# Patient Record
Sex: Male | Born: 1960
Health system: Southern US, Community
[De-identification: ages and names within clinical notes are randomized; demographics above are authoritative.]

## PROBLEM LIST (undated history)

## (undated) DIAGNOSIS — I469 Cardiac arrest, cause unspecified: Secondary | ICD-10-CM

## (undated) DIAGNOSIS — R55 Syncope and collapse: Secondary | ICD-10-CM

## (undated) DIAGNOSIS — I509 Heart failure, unspecified: Secondary | ICD-10-CM

## (undated) DIAGNOSIS — I255 Ischemic cardiomyopathy: Secondary | ICD-10-CM

## (undated) DIAGNOSIS — I2510 Atherosclerotic heart disease of native coronary artery without angina pectoris: Secondary | ICD-10-CM

## (undated) DIAGNOSIS — F1011 Alcohol abuse, in remission: Secondary | ICD-10-CM

## (undated) DIAGNOSIS — K219 Gastro-esophageal reflux disease without esophagitis: Secondary | ICD-10-CM

## (undated) DIAGNOSIS — E7800 Pure hypercholesterolemia, unspecified: Secondary | ICD-10-CM

## (undated) DIAGNOSIS — I10 Essential (primary) hypertension: Secondary | ICD-10-CM

## (undated) DIAGNOSIS — R569 Unspecified convulsions: Principal | ICD-10-CM

## (undated) DIAGNOSIS — M1990 Unspecified osteoarthritis, unspecified site: Secondary | ICD-10-CM

## (undated) DIAGNOSIS — Z95810 Presence of automatic (implantable) cardiac defibrillator: Secondary | ICD-10-CM

## (undated) HISTORY — PX: CARDIAC CATHETERIZATION: SHX172

## (undated) HISTORY — PX: TONSILLECTOMY AND ADENOIDECTOMY: SUR1326

---
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
## 2015-08-18 DIAGNOSIS — E869 Volume depletion, unspecified: Secondary | ICD-10-CM | POA: Insufficient documentation

## 2015-08-18 DIAGNOSIS — G459 Transient cerebral ischemic attack, unspecified: Secondary | ICD-10-CM | POA: Insufficient documentation

## 2015-08-18 DIAGNOSIS — D696 Thrombocytopenia, unspecified: Secondary | ICD-10-CM | POA: Insufficient documentation

## 2015-08-19 DIAGNOSIS — S134XXA Sprain of ligaments of cervical spine, initial encounter: Secondary | ICD-10-CM | POA: Insufficient documentation

## 2015-08-19 DIAGNOSIS — S42112A Displaced fracture of body of scapula, left shoulder, initial encounter for closed fracture: Secondary | ICD-10-CM | POA: Insufficient documentation

## 2015-08-31 DIAGNOSIS — Z5971 Insufficient health insurance coverage: Secondary | ICD-10-CM | POA: Insufficient documentation

## 2017-11-29 ENCOUNTER — Inpatient Hospital Stay (HOSPITAL_COMMUNITY): Payer: Medicaid Other

## 2017-11-29 ENCOUNTER — Inpatient Hospital Stay (HOSPITAL_COMMUNITY)
Admission: EM | Admit: 2017-11-29 | Discharge: 2017-12-13 | DRG: 233 | Disposition: A | Discharge status: Home Health | Payer: Medicaid Other | Attending: Cardiothoracic Surgery | Admitting: Cardiothoracic Surgery

## 2017-11-29 ENCOUNTER — Emergency Department (HOSPITAL_COMMUNITY): Payer: Medicaid Other

## 2017-11-29 ENCOUNTER — Other Ambulatory Visit: Payer: Self-pay

## 2017-11-29 ENCOUNTER — Encounter (HOSPITAL_COMMUNITY)
Admission: EM | Disposition: A | Discharge status: Home Health | Payer: Self-pay | Source: Home / Self Care | Attending: Internal Medicine

## 2017-11-29 DIAGNOSIS — D62 Acute posthemorrhagic anemia: Secondary | ICD-10-CM | POA: Diagnosis not present

## 2017-11-29 DIAGNOSIS — I469 Cardiac arrest, cause unspecified: Secondary | ICD-10-CM | POA: Insufficient documentation

## 2017-11-29 DIAGNOSIS — R008 Other abnormalities of heart beat: Secondary | ICD-10-CM | POA: Diagnosis not present

## 2017-11-29 DIAGNOSIS — I213 ST elevation (STEMI) myocardial infarction of unspecified site: Principal | ICD-10-CM | POA: Diagnosis present

## 2017-11-29 DIAGNOSIS — Z9289 Personal history of other medical treatment: Secondary | ICD-10-CM

## 2017-11-29 DIAGNOSIS — Z4509 Encounter for adjustment and management of other cardiac device: Secondary | ICD-10-CM

## 2017-11-29 DIAGNOSIS — E876 Hypokalemia: Secondary | ICD-10-CM | POA: Diagnosis present

## 2017-11-29 DIAGNOSIS — R739 Hyperglycemia, unspecified: Secondary | ICD-10-CM | POA: Diagnosis present

## 2017-11-29 DIAGNOSIS — I4901 Ventricular fibrillation: Secondary | ICD-10-CM | POA: Diagnosis not present

## 2017-11-29 DIAGNOSIS — J90 Pleural effusion, not elsewhere classified: Secondary | ICD-10-CM

## 2017-11-29 DIAGNOSIS — I255 Ischemic cardiomyopathy: Secondary | ICD-10-CM | POA: Diagnosis present

## 2017-11-29 DIAGNOSIS — I2584 Coronary atherosclerosis due to calcified coronary lesion: Secondary | ICD-10-CM | POA: Diagnosis present

## 2017-11-29 DIAGNOSIS — J9622 Acute and chronic respiratory failure with hypercapnia: Secondary | ICD-10-CM

## 2017-11-29 DIAGNOSIS — I493 Ventricular premature depolarization: Secondary | ICD-10-CM | POA: Diagnosis present

## 2017-11-29 DIAGNOSIS — G931 Anoxic brain damage, not elsewhere classified: Secondary | ICD-10-CM

## 2017-11-29 DIAGNOSIS — E669 Obesity, unspecified: Secondary | ICD-10-CM | POA: Diagnosis present

## 2017-11-29 DIAGNOSIS — J690 Pneumonitis due to inhalation of food and vomit: Secondary | ICD-10-CM | POA: Diagnosis not present

## 2017-11-29 DIAGNOSIS — I472 Ventricular tachycardia: Secondary | ICD-10-CM | POA: Diagnosis not present

## 2017-11-29 DIAGNOSIS — E8770 Fluid overload, unspecified: Secondary | ICD-10-CM | POA: Diagnosis not present

## 2017-11-29 DIAGNOSIS — R570 Cardiogenic shock: Secondary | ICD-10-CM

## 2017-11-29 DIAGNOSIS — N179 Acute kidney failure, unspecified: Secondary | ICD-10-CM | POA: Diagnosis present

## 2017-11-29 DIAGNOSIS — J9819 Other pulmonary collapse: Secondary | ICD-10-CM | POA: Diagnosis not present

## 2017-11-29 DIAGNOSIS — G92 Toxic encephalopathy: Secondary | ICD-10-CM | POA: Diagnosis not present

## 2017-11-29 DIAGNOSIS — J9601 Acute respiratory failure with hypoxia: Secondary | ICD-10-CM | POA: Diagnosis present

## 2017-11-29 DIAGNOSIS — R5082 Postprocedural fever: Secondary | ICD-10-CM | POA: Diagnosis not present

## 2017-11-29 DIAGNOSIS — Z9189 Other specified personal risk factors, not elsewhere classified: Secondary | ICD-10-CM

## 2017-11-29 DIAGNOSIS — Z951 Presence of aortocoronary bypass graft: Secondary | ICD-10-CM

## 2017-11-29 DIAGNOSIS — I110 Hypertensive heart disease with heart failure: Secondary | ICD-10-CM | POA: Diagnosis present

## 2017-11-29 DIAGNOSIS — I509 Heart failure, unspecified: Secondary | ICD-10-CM

## 2017-11-29 DIAGNOSIS — I5021 Acute systolic (congestive) heart failure: Secondary | ICD-10-CM | POA: Diagnosis not present

## 2017-11-29 DIAGNOSIS — I2510 Atherosclerotic heart disease of native coronary artery without angina pectoris: Secondary | ICD-10-CM | POA: Diagnosis present

## 2017-11-29 DIAGNOSIS — E878 Other disorders of electrolyte and fluid balance, not elsewhere classified: Secondary | ICD-10-CM | POA: Diagnosis present

## 2017-11-29 DIAGNOSIS — F1010 Alcohol abuse, uncomplicated: Secondary | ICD-10-CM | POA: Diagnosis present

## 2017-11-29 DIAGNOSIS — D6489 Other specified anemias: Secondary | ICD-10-CM | POA: Diagnosis present

## 2017-11-29 DIAGNOSIS — J9811 Atelectasis: Secondary | ICD-10-CM | POA: Diagnosis present

## 2017-11-29 DIAGNOSIS — E872 Acidosis: Secondary | ICD-10-CM | POA: Diagnosis present

## 2017-11-29 DIAGNOSIS — I252 Old myocardial infarction: Secondary | ICD-10-CM

## 2017-11-29 DIAGNOSIS — Z6823 Body mass index (BMI) 23.0-23.9, adult: Secondary | ICD-10-CM

## 2017-11-29 DIAGNOSIS — R0602 Shortness of breath: Secondary | ICD-10-CM

## 2017-11-29 DIAGNOSIS — J9690 Respiratory failure, unspecified, unspecified whether with hypoxia or hypercapnia: Secondary | ICD-10-CM

## 2017-11-29 DIAGNOSIS — Z9889 Other specified postprocedural states: Secondary | ICD-10-CM

## 2017-11-29 HISTORY — DX: Cardiac arrest, cause unspecified: I46.9

## 2017-11-29 HISTORY — PX: IABP INSERTION: CATH118242

## 2017-11-29 HISTORY — PX: RIGHT HEART CATH: CATH118263

## 2017-11-29 HISTORY — PX: LEFT HEART CATH AND CORONARY ANGIOGRAPHY: CATH118249

## 2017-11-29 LAB — POCT I-STAT 3, ART BLOOD GAS (G3+)
ACID-BASE DEFICIT: 5 mmol/L — AB (ref 0.0–2.0)
Bicarbonate: 19.3 mmol/L — ABNORMAL LOW (ref 20.0–28.0)
O2 Saturation: 98 %
PH ART: 7.352 (ref 7.350–7.450)
TCO2: 20 mmol/L — ABNORMAL LOW (ref 22–32)
pCO2 arterial: 34.9 mmHg (ref 32.0–48.0)
pO2, Arterial: 117 mmHg — ABNORMAL HIGH (ref 83.0–108.0)

## 2017-11-29 LAB — CBC WITH DIFFERENTIAL/PLATELET
Basophils Absolute: 0.1 10*3/uL (ref 0.0–0.1)
Basophils Relative: 1 %
EOS ABS: 0.2 10*3/uL (ref 0.0–0.7)
EOS PCT: 2 %
HCT: 42.5 % (ref 39.0–52.0)
Hemoglobin: 14.3 g/dL (ref 13.0–17.0)
LYMPHS ABS: 3.1 10*3/uL (ref 0.7–4.0)
Lymphocytes Relative: 29 %
MCH: 32.6 pg (ref 26.0–34.0)
MCHC: 33.6 g/dL (ref 30.0–36.0)
MCV: 97 fL (ref 78.0–100.0)
MONO ABS: 0.9 10*3/uL (ref 0.1–1.0)
Monocytes Relative: 8 %
Neutro Abs: 6.5 10*3/uL (ref 1.7–7.7)
Neutrophils Relative %: 60 %
PLATELETS: 241 10*3/uL (ref 150–400)
RBC: 4.38 MIL/uL (ref 4.22–5.81)
RDW: 13 % (ref 11.5–15.5)
WBC: 10.8 10*3/uL — AB (ref 4.0–10.5)

## 2017-11-29 LAB — BASIC METABOLIC PANEL
ANION GAP: 14 (ref 5–15)
BUN: 14 mg/dL (ref 6–20)
CALCIUM: 8 mg/dL — AB (ref 8.9–10.3)
CO2: 20 mmol/L — ABNORMAL LOW (ref 22–32)
Chloride: 105 mmol/L (ref 101–111)
Creatinine, Ser: 1.49 mg/dL — ABNORMAL HIGH (ref 0.61–1.24)
GFR, EST AFRICAN AMERICAN: 60 mL/min — AB (ref >60)
GFR, EST NON AFRICAN AMERICAN: 52 mL/min — AB (ref >60)
Glucose, Bld: 156 mg/dL — ABNORMAL HIGH (ref 65–99)
POTASSIUM: 3.3 mmol/L — AB (ref 3.5–5.1)
Sodium: 139 mmol/L (ref 135–145)

## 2017-11-29 LAB — GLUCOSE, CAPILLARY
Glucose-Capillary: 131 mg/dL — ABNORMAL HIGH (ref 65–99)
Glucose-Capillary: 112 mg/dL — ABNORMAL HIGH (ref 65–99)

## 2017-11-29 LAB — COOXEMETRY PANEL
CARBOXYHEMOGLOBIN: 1 % (ref 0.5–1.5)
Methemoglobin: 1.2 % (ref 0.0–1.5)
O2 SAT: 59.2 %
TOTAL HEMOGLOBIN: 15.1 g/dL (ref 12.0–16.0)

## 2017-11-29 LAB — COMPREHENSIVE METABOLIC PANEL
ALT: 37 U/L (ref 17–63)
AST: 78 U/L — AB (ref 15–41)
Albumin: 4.1 g/dL (ref 3.5–5.0)
Alkaline Phosphatase: 45 U/L (ref 38–126)
Anion gap: 15 (ref 5–15)
BILIRUBIN TOTAL: 1 mg/dL (ref 0.3–1.2)
BUN: 14 mg/dL (ref 6–20)
CHLORIDE: 106 mmol/L (ref 101–111)
CO2: 18 mmol/L — ABNORMAL LOW (ref 22–32)
CREATININE: 1.69 mg/dL — AB (ref 0.61–1.24)
Calcium: 9 mg/dL (ref 8.9–10.3)
GFR calc Af Amer: 51 mL/min — ABNORMAL LOW (ref >60)
GFR, EST NON AFRICAN AMERICAN: 44 mL/min — AB (ref >60)
Glucose, Bld: 181 mg/dL — ABNORMAL HIGH (ref 65–99)
Potassium: 3.4 mmol/L — ABNORMAL LOW (ref 3.5–5.1)
Sodium: 139 mmol/L (ref 135–145)
Total Protein: 6.6 g/dL (ref 6.5–8.1)

## 2017-11-29 LAB — I-STAT TROPONIN, ED: Troponin i, poc: 0.95 ng/mL (ref 0.00–0.08)

## 2017-11-29 LAB — I-STAT CHEM 8, ED
BUN: 15 mg/dL (ref 6–20)
CREATININE: 1.4 mg/dL — AB (ref 0.61–1.24)
Calcium, Ion: 1.12 mmol/L — ABNORMAL LOW (ref 1.15–1.40)
Chloride: 106 mmol/L (ref 101–111)
Glucose, Bld: 181 mg/dL — ABNORMAL HIGH (ref 65–99)
HEMATOCRIT: 42 % (ref 39.0–52.0)
HEMOGLOBIN: 14.3 g/dL (ref 13.0–17.0)
POTASSIUM: 3.4 mmol/L — AB (ref 3.5–5.1)
Sodium: 142 mmol/L (ref 135–145)
TCO2: 19 mmol/L — ABNORMAL LOW (ref 22–32)

## 2017-11-29 LAB — POCT I-STAT 3, VENOUS BLOOD GAS (G3P V)
ACID-BASE DEFICIT: 2 mmol/L (ref 0.0–2.0)
Bicarbonate: 23.6 mmol/L (ref 20.0–28.0)
O2 SAT: 51 %
TCO2: 25 mmol/L (ref 22–32)
pCO2, Ven: 44.2 mmHg (ref 44.0–60.0)
pH, Ven: 7.335 (ref 7.250–7.430)
pO2, Ven: 29 mmHg — CL (ref 32.0–45.0)

## 2017-11-29 LAB — POCT ACTIVATED CLOTTING TIME: Activated Clotting Time: 213 seconds

## 2017-11-29 LAB — PROTIME-INR
INR: 1.11
PROTHROMBIN TIME: 14.2 s (ref 11.4–15.2)

## 2017-11-29 LAB — SURGICAL PCR SCREEN
MRSA, PCR: NEGATIVE
STAPHYLOCOCCUS AUREUS: NEGATIVE

## 2017-11-29 LAB — TRIGLYCERIDES: TRIGLYCERIDES: 101 mg/dL (ref <150)

## 2017-11-29 SURGERY — LEFT HEART CATH AND CORONARY ANGIOGRAPHY
Anesthesia: LOCAL

## 2017-11-29 MED ORDER — FENTANYL CITRATE (PF) 100 MCG/2ML IJ SOLN
INTRAMUSCULAR | Status: DC | PRN
  Administered 2017-11-29: 50 ug via INTRAVENOUS

## 2017-11-29 MED ORDER — SODIUM CHLORIDE 0.9% FLUSH
3.0000 mL | INTRAVENOUS | Status: DC | PRN
  Administered 2017-12-02: 3 mL via INTRAVENOUS
  Filled 2017-11-29: qty 3

## 2017-11-29 MED ORDER — FUROSEMIDE 10 MG/ML IJ SOLN
INTRAMUSCULAR | Status: AC
  Filled 2017-11-29: qty 4

## 2017-11-29 MED ORDER — LIDOCAINE HCL (PF) 1 % IJ SOLN
INTRAMUSCULAR | Status: AC
  Filled 2017-11-29: qty 30

## 2017-11-29 MED ORDER — MIDAZOLAM HCL 2 MG/2ML IJ SOLN
INTRAMUSCULAR | Status: AC
  Filled 2017-11-29: qty 2

## 2017-11-29 MED ORDER — PANTOPRAZOLE SODIUM 40 MG IV SOLR
40.0000 mg | INTRAVENOUS | Status: DC
  Administered 2017-11-29: 40 mg via INTRAVENOUS
  Filled 2017-11-29: qty 40

## 2017-11-29 MED ORDER — MAGNESIUM SULFATE 2 GM/50ML IV SOLN
2.0000 g | Freq: Once | INTRAVENOUS | Status: AC
  Administered 2017-11-29: 2 g via INTRAVENOUS
  Filled 2017-11-29: qty 50

## 2017-11-29 MED ORDER — MIDAZOLAM HCL 2 MG/2ML IJ SOLN
INTRAMUSCULAR | Status: DC | PRN
  Administered 2017-11-29: 2 mg

## 2017-11-29 MED ORDER — IOPAMIDOL (ISOVUE-370) INJECTION 76%
INTRAVENOUS | Status: DC | PRN
Start: 2017-11-29 — End: 2017-11-29
  Administered 2017-11-29: 75 mL via INTRAVENOUS

## 2017-11-29 MED ORDER — FENTANYL CITRATE (PF) 100 MCG/2ML IJ SOLN
INTRAMUSCULAR | Status: AC
  Filled 2017-11-29: qty 2

## 2017-11-29 MED ORDER — SODIUM CHLORIDE 0.9 % IV SOLN: 250.0000 mL | INTRAVENOUS | Status: DC | PRN

## 2017-11-29 MED ORDER — FUROSEMIDE 10 MG/ML IJ SOLN
INTRAMUSCULAR | Status: DC | PRN
  Administered 2017-11-29: 80 mg via INTRAVENOUS

## 2017-11-29 MED ORDER — ACETAMINOPHEN 325 MG PO TABS: 650.0000 mg | ORAL_TABLET | ORAL | Status: DC | PRN

## 2017-11-29 MED ORDER — PROPOFOL 1000 MG/100ML IV EMUL
INTRAVENOUS | Status: AC
  Filled 2017-11-29: qty 100

## 2017-11-29 MED ORDER — SODIUM CHLORIDE 0.9% FLUSH
3.0000 mL | Freq: Two times a day (BID) | INTRAVENOUS | Status: DC
  Administered 2017-11-29 – 2017-12-02 (×5): 3 mL via INTRAVENOUS

## 2017-11-29 MED ORDER — SODIUM CHLORIDE 0.9 % IV SOLN
3.0000 g | Freq: Four times a day (QID) | INTRAVENOUS | Status: DC
  Filled 2017-11-29 (×3): qty 3

## 2017-11-29 MED ORDER — FENTANYL 2500MCG IN NS 250ML (10MCG/ML) PREMIX INFUSION
25.0000 ug/h | INTRAVENOUS | Status: DC
  Administered 2017-11-29: 50 ug/h via INTRAVENOUS
  Administered 2017-11-30: 100 ug/h via INTRAVENOUS
  Administered 2017-12-01: 300 ug/h via INTRAVENOUS
  Administered 2017-12-02: 200 ug/h via INTRAVENOUS
  Filled 2017-11-29 (×6): qty 250

## 2017-11-29 MED ORDER — HEPARIN (PORCINE) IN NACL 100-0.45 UNIT/ML-% IJ SOLN
1000.0000 [IU]/h | INTRAMUSCULAR | Status: DC
  Administered 2017-11-29: 1000 [IU]/h via INTRAVENOUS
  Filled 2017-11-29: qty 250

## 2017-11-29 MED ORDER — FENTANYL CITRATE (PF) 100 MCG/2ML IJ SOLN: 50.0000 ug | Freq: Once | INTRAMUSCULAR | Status: DC

## 2017-11-29 MED ORDER — SODIUM CHLORIDE 0.9 % IV SOLN
0.0000 mg/h | INTRAVENOUS | Status: DC
  Administered 2017-11-29: 1 mg/h via INTRAVENOUS
  Administered 2017-11-30: 2 mg/h via INTRAVENOUS
  Administered 2017-12-01: 3 mg/h via INTRAVENOUS
  Filled 2017-11-29 (×4): qty 10

## 2017-11-29 MED ORDER — VANCOMYCIN HCL IN DEXTROSE 1-5 GM/200ML-% IV SOLN
1000.0000 mg | Freq: Two times a day (BID) | INTRAVENOUS | Status: DC
  Administered 2017-11-29: 1000 mg via INTRAVENOUS
  Filled 2017-11-29 (×2): qty 200

## 2017-11-29 MED ORDER — MILRINONE LACTATE IN DEXTROSE 20-5 MG/100ML-% IV SOLN
0.3750 ug/kg/min | INTRAVENOUS | Status: DC
  Administered 2017-11-30: 0.375 ug/kg/min via INTRAVENOUS
  Filled 2017-11-29 (×2): qty 100

## 2017-11-29 MED ORDER — LIDOCAINE HCL (PF) 1 % IJ SOLN
INTRAMUSCULAR | Status: DC | PRN
  Administered 2017-11-29: 2 mL via INTRADERMAL
  Administered 2017-11-29: 15 mL via SUBCUTANEOUS
  Administered 2017-11-29: 1 mL via SUBCUTANEOUS

## 2017-11-29 MED ORDER — HEPARIN (PORCINE) IN NACL 2-0.9 UNIT/ML-% IJ SOLN
INTRAMUSCULAR | Status: AC | PRN
  Administered 2017-11-29: 1500 mL via INTRA_ARTERIAL

## 2017-11-29 MED ORDER — SPIRONOLACTONE 25 MG PO TABS
12.5000 mg | ORAL_TABLET | Freq: Every day | ORAL | Status: DC
  Administered 2017-11-30 – 2017-12-04 (×5): 12.5 mg via ORAL
  Filled 2017-11-29: qty 0.5
  Filled 2017-11-29 (×5): qty 1
  Filled 2017-11-29: qty 0.5
  Filled 2017-11-29 (×2): qty 1
  Filled 2017-11-29: qty 0.5
  Filled 2017-11-29: qty 1

## 2017-11-29 MED ORDER — HEPARIN (PORCINE) IN NACL 2-0.9 UNIT/ML-% IJ SOLN
INTRAMUSCULAR | Status: AC
  Filled 2017-11-29: qty 1500

## 2017-11-29 MED ORDER — SODIUM CHLORIDE 0.9 % IV SOLN
INTRAVENOUS | Status: DC | PRN
  Administered 2017-11-29: 10 mL/h via INTRAVENOUS

## 2017-11-29 MED ORDER — MILRINONE LACTATE IN DEXTROSE 20-5 MG/100ML-% IV SOLN
INTRAVENOUS | Status: DC | PRN
  Administered 2017-11-29: 0.25 ug/kg/min via INTRAVENOUS

## 2017-11-29 MED ORDER — ONDANSETRON HCL 4 MG/2ML IJ SOLN
4.0000 mg | Freq: Four times a day (QID) | INTRAMUSCULAR | Status: DC | PRN
  Administered 2017-12-03 – 2017-12-06 (×3): 4 mg via INTRAVENOUS
  Filled 2017-11-29 (×3): qty 2

## 2017-11-29 MED ORDER — POTASSIUM CHLORIDE 10 MEQ/50ML IV SOLN
10.0000 meq | INTRAVENOUS | Status: AC
  Administered 2017-11-29 – 2017-11-30 (×2): 10 meq via INTRAVENOUS
  Filled 2017-11-29 (×2): qty 50

## 2017-11-29 MED ORDER — PROPOFOL 1000 MG/100ML IV EMUL
0.0000 ug/kg/min | INTRAVENOUS | Status: DC
  Administered 2017-11-30: 10 ug/kg/min via INTRAVENOUS
  Filled 2017-11-29: qty 100

## 2017-11-29 MED ORDER — ETOMIDATE 2 MG/ML IV SOLN
INTRAVENOUS | Status: DC | PRN
  Administered 2017-11-29: 20 mg via INTRAVENOUS

## 2017-11-29 MED ORDER — PIPERACILLIN-TAZOBACTAM 3.375 G IVPB
3.3750 g | Freq: Three times a day (TID) | INTRAVENOUS | Status: AC
  Administered 2017-11-29 – 2017-12-06 (×21): 3.375 g via INTRAVENOUS
  Filled 2017-11-29 (×22): qty 50

## 2017-11-29 MED ORDER — PROPOFOL 1000 MG/100ML IV EMUL
0.0000 ug/kg/min | INTRAVENOUS | Status: DC
  Administered 2017-11-29: 50 ug/kg/min via INTRAVENOUS

## 2017-11-29 MED ORDER — NOREPINEPHRINE BITARTRATE 1 MG/ML IV SOLN
0.0000 ug/min | INTRAVENOUS | Status: DC
  Administered 2017-11-29: 5 ug/min via INTRAVENOUS
  Administered 2017-11-30: 12 ug/min via INTRAVENOUS
  Administered 2017-11-30: 20 ug/min via INTRAVENOUS
  Filled 2017-11-29 (×2): qty 4

## 2017-11-29 MED ORDER — SODIUM CHLORIDE 0.9 % IV SOLN
INTRAVENOUS | Status: DC | PRN
  Administered 2017-11-29 – 2017-12-04 (×2): 1000 mL via INTRAVENOUS

## 2017-11-29 MED ORDER — HEPARIN SODIUM (PORCINE) 1000 UNIT/ML IJ SOLN
INTRAMUSCULAR | Status: DC | PRN
  Administered 2017-11-29: 4000 [IU] via INTRAVENOUS

## 2017-11-29 MED ORDER — SODIUM BICARBONATE 8.4 % IV SOLN
INTRAVENOUS | Status: DC | PRN
  Administered 2017-11-29: 50 meq via INTRAVENOUS

## 2017-11-29 MED ORDER — IOPAMIDOL (ISOVUE-370) INJECTION 76%
INTRAVENOUS | Status: AC
  Filled 2017-11-29: qty 125

## 2017-11-29 MED ORDER — HEPARIN SODIUM (PORCINE) 1000 UNIT/ML IJ SOLN
INTRAMUSCULAR | Status: DC | PRN
  Administered 2017-11-29: 2500 [IU] via INTRAVENOUS

## 2017-11-29 MED ORDER — POTASSIUM CHLORIDE 20 MEQ/15ML (10%) PO SOLN
40.0000 meq | ORAL | Status: AC
  Administered 2017-11-29 (×2): 40 meq
  Filled 2017-11-29 (×4): qty 30

## 2017-11-29 MED ORDER — ORAL CARE MOUTH RINSE
15.0000 mL | Freq: Four times a day (QID) | OROMUCOSAL | Status: DC
  Administered 2017-11-29 – 2017-12-02 (×14): 15 mL via OROMUCOSAL

## 2017-11-29 MED ORDER — MIDAZOLAM HCL 2 MG/2ML IJ SOLN
INTRAMUSCULAR | Status: DC | PRN
  Administered 2017-11-29: 3 mg via INTRAVENOUS

## 2017-11-29 MED ORDER — VERAPAMIL HCL 2.5 MG/ML IV SOLN
INTRAVENOUS | Status: DC | PRN
  Administered 2017-11-29: 10 mL via INTRA_ARTERIAL

## 2017-11-29 MED ORDER — INSULIN ASPART 100 UNIT/ML ~~LOC~~ SOLN
2.0000 [IU] | SUBCUTANEOUS | Status: DC
  Administered 2017-11-29 – 2017-12-07 (×11): 2 [IU] via SUBCUTANEOUS
  Filled 2017-11-29: qty 0.06

## 2017-11-29 MED ORDER — HEPARIN SODIUM (PORCINE) 1000 UNIT/ML IJ SOLN
INTRAMUSCULAR | Status: AC
  Filled 2017-11-29: qty 1

## 2017-11-29 MED ORDER — FENTANYL CITRATE (PF) 100 MCG/2ML IJ SOLN
INTRAMUSCULAR | Status: DC | PRN
  Administered 2017-11-29: 25 ug via INTRAVENOUS
  Administered 2017-11-29: 50 ug via INTRAVENOUS

## 2017-11-29 MED ORDER — ROCURONIUM BROMIDE 50 MG/5ML IV SOLN
INTRAVENOUS | Status: DC | PRN
  Administered 2017-11-29: 50 mg via INTRAVENOUS
  Administered 2017-11-29: 80 mg via INTRAVENOUS
  Administered 2017-11-29: 50 mg via INTRAVENOUS

## 2017-11-29 MED ORDER — PROPOFOL 10 MG/ML IV BOLUS
INTRAVENOUS | Status: DC | PRN
  Administered 2017-11-30: 08:00:00 via INTRAVENOUS

## 2017-11-29 MED ORDER — DIGOXIN 125 MCG PO TABS
0.1250 mg | ORAL_TABLET | Freq: Every day | ORAL | Status: DC
  Administered 2017-11-30 – 2017-12-06 (×7): 0.125 mg via ORAL
  Filled 2017-11-29 (×7): qty 1

## 2017-11-29 MED ORDER — FENTANYL BOLUS VIA INFUSION
50.0000 ug | INTRAVENOUS | Status: DC | PRN
  Administered 2017-11-30 – 2017-12-02 (×6): 50 ug via INTRAVENOUS
  Filled 2017-11-29: qty 50

## 2017-11-29 MED ORDER — CHLORHEXIDINE GLUCONATE 0.12% ORAL RINSE (MEDLINE KIT)
15.0000 mL | Freq: Two times a day (BID) | OROMUCOSAL | Status: DC
  Administered 2017-11-29 – 2017-12-04 (×10): 15 mL via OROMUCOSAL

## 2017-11-29 MED ORDER — VERAPAMIL HCL 2.5 MG/ML IV SOLN
INTRAVENOUS | Status: AC
  Filled 2017-11-29: qty 2

## 2017-11-29 MED ORDER — SODIUM BICARBONATE 8.4 % IV SOLN
INTRAVENOUS | Status: AC
  Filled 2017-11-29: qty 50

## 2017-11-29 SURGICAL SUPPLY — 16 items
BALLN IABP SENSA PLUS 8F 50CC (BALLOONS) ×2
BALLOON IABP SENS PLUS 8F 50CC (BALLOONS) ×1 IMPLANT
CATH 5FR JL3.5 JR4 ANG PIG MP (CATHETERS) ×2 IMPLANT
CATH SWAN GANZ 7F STRAIGHT (CATHETERS) ×2 IMPLANT
DEVICE RAD COMP TR BAND LRG (VASCULAR PRODUCTS) ×2 IMPLANT
GLIDESHEATH SLEND SS 6F .021 (SHEATH) ×4 IMPLANT
GUIDEWIRE INQWIRE 1.5J.035X260 (WIRE) ×1 IMPLANT
INQWIRE 1.5J .035X260CM (WIRE) ×2
KIT ENCORE 26 ADVANTAGE (KITS) ×2 IMPLANT
KIT HEART LEFT (KITS) ×2 IMPLANT
KIT MICROINTRODUCER 5F 7206 (SHEATH) ×2 IMPLANT
KIT MICROINTRODUCER STIFF 5F (SHEATH) ×2 IMPLANT
PACK CARDIAC CATHETERIZATION (CUSTOM PROCEDURE TRAY) ×2 IMPLANT
SHEATH PINNACLE 7F 10CM (SHEATH) ×2 IMPLANT
TRANSDUCER W/STOPCOCK (MISCELLANEOUS) ×2 IMPLANT
TUBING CIL FLEX 10 FLL-RA (TUBING) ×2 IMPLANT

## 2017-11-29 NOTE — Progress Notes (Signed)
eLink Physician-Brief Progress Note Patient Name: Briant CedarWesley Clay Northrup DOB: 04/20/1961 MRN: [id number]   Date of Service  11/29/2017  HPI/Events of Note  New Admission. Status post V. fib arrest. Currently in cardiogenic shock. Intra-aortic balloon pump in place. Aspirated. Not a candidate for hypothermia protocol per evaluating intensivist.   eICU Interventions  Continuing plan of care as per admitting physicians.      Intervention Category Evaluation Type: New Patient Evaluation  Lawanda CousinsJennings Jonise Weightman 11/29/2017, 9:10 PM

## 2017-11-29 NOTE — Progress Notes (Signed)
   ADVANCED HF TEAM NOTE  56 y/o male with uncertain PMHx presented to ER with VT/VF arrest. Intubated in ER. Chest CT with RUL collapse. Underwent bronch and brought emergently to cath lab.   Cath by Dr. Okey DupreEnd with severe 3v CAD and LVEDP 45. IABP placed  The HF team was consulted for help with management.   We sedated patient and paralyzed him. Milrinone started and lasix 80 IV given.   I then placed RIJ swan and left radial arterial line.   Patient with persistent hypoxemia and low cardiac output. Recruitment maneuvers performed with RT. Milrinone increased.  Foley placed.   D/w CCM and felt not to be candidate for hypothermia protocol.   On exam intubated and sedated Cor Reg tachy Lungs diffuse rhonchi Ab obese soft NT/ND Ext cool. IABP in R groin Neuro: sedated paralyzed  Assessment: 1. VF arrest 2. Cardiogenic shock 3. Severe 3-V CAD 4. Acute hypoxic respiratory failure with RUL collapse  Plan:  Admit ICU. Supportive care as above with IABP/pressors.  Broad spectrum abx P/CCM to help manage vent Heparin for IABP  CRITICAL CARE Performed by: Arvilla MeresBensimhon, Gleason Ardoin  Total critical care time: 60 minutes  Critical care time was exclusive of separately billable procedures and treating other patients.  Critical care was necessary to treat or prevent imminent or life-threatening deterioration.  Critical care was time spent personally by me (independent of midlevel providers or residents) on the following activities: development of treatment plan with patient and/or surrogate as well as nursing, discussions with consultants, evaluation of patient's response to treatment, examination of patient, obtaining history from patient or surrogate, ordering and performing treatments and interventions, ordering and review of laboratory studies, ordering and review of radiographic studies, pulse oximetry and re-evaluation of patient's condition.  Arvilla Meresaniel Zander Ingham, MD  7:37 PM

## 2017-11-29 NOTE — Progress Notes (Signed)
RT note: RT received patient from EMS with king airway in place. [facility]. Patient required intubation. RT assisted MD with intubation. Tube verified by ETCO2 and direct visualization Tube secured at 24cm, RT advanced et tube to 26cm post cxr per DR Gwinda MaineYacoucb..Marland Kitchen

## 2017-11-29 NOTE — Progress Notes (Addendum)
Pharmacy Consult Note  Pharmacy Consult for heparin Indication: chest pain/ACS and IABP  No Known Allergies  Patient Measurements: Weight: 191 lb 12.8 oz (87 kg) Heparin Dosing Weight: 80kg  Vital Signs: BP: 110/66 (12/06 1640) Pulse Rate: 149 (12/06 1642)  Labs: Recent Labs    11/29/17 1638 11/29/17 1643  HGB 14.3 14.3  HCT 42.5 42.0  PLT 241  --   LABPROT 14.2  --   INR 1.11  --   CREATININE 1.69* 1.40*    CrCl cannot be calculated (Unknown ideal weight.).   Medical History: No past medical history on file.  Assessment: 56 year old male with witnessed cardiac arrest and immediate CPR and shock by AED. Patient brought immediately to cath and found to have MV CAD with need for IABP placement. Orders to start IV heparin now.  Lung collapsed and opened during bronch by CCM, likely severe aspiration. Broad antibiotics ordered with vancomycin and zosyn  Goal of Therapy:  Heparin level 0.3-0.5 units/ml Monitor platelets by anticoagulation protocol: Yes   Plan:  Start heparin infusion at 1000 units/hr Check anti-Xa level in 6 hours and daily while on heparin Continue to monitor H&H and platelets  Vancomycin 1g IV q12 hours Zosyn 3.375g IV q8 hours  Sheppard CoilFrank Wilson PharmD., BCPS Clinical Pharmacist Pager [phone] 11/29/2017 6:58 PM

## 2017-11-29 NOTE — ED Notes (Signed)
King airway removed and patient is agitated.

## 2017-11-29 NOTE — ED Triage Notes (Signed)
Patient was down for 5-6 min.  Fire intubated with king airway and shocked once per AED.  Lidocaine drip in place from EMS.  Out chopping wood when passed out, EKG concerning for ST elevation. MD and ED resident at bedside.

## 2017-11-29 NOTE — Consult Note (Signed)
PULMONARY / CRITICAL CARE MEDICINE   Name: [name] MRN: [id number] DOB: 10/17/1961    ADMISSION DATE:  11/29/2017 CONSULTATION DATE:  11/29/2017  REFERRING MD:  Dr. Jens Somrenshaw  CHIEF COMPLAINT:  Cardiac arrest and respiratory failure  HISTORY OF PRESENT ILLNESS:   56 year old male with no known PMH who was chopping wood then collapsed.  CPR was started immediately by a bystander.  Fire department arrived in under 5 minutes and shocked x1 by AED with ROSC.  Patient was reaching for the king airway so reportedly purposeful.  No other history available.  PAST MEDICAL HISTORY :  He  has no past medical history on file.  PAST SURGICAL HISTORY: He  has no past surgical history on file.  No Known Allergies  No current facility-administered medications on file prior to encounter.    No current outpatient medications on file prior to encounter.    FAMILY HISTORY:  His has no family status information on file.    SOCIAL HISTORY: Unknown  REVIEW OF SYSTEMS:   Unattainable  SUBJECTIVE:  Sedated and paralyzed  VITAL SIGNS: BP 110/66 (BP Location: Left Arm)   Pulse (!) 149   Resp (!) 26   SpO2 99%   HEMODYNAMICS:    VENTILATOR SETTINGS:    INTAKE / OUTPUT: No intake/output data recorded.  PHYSICAL EXAMINATION: General:  Acutely ill appearing male, sedated and paralyzed. Neuro:  Sedated and paralyzed HEENT:  [place]/AT, PERRL, no EOM and bloody mucous membranes Cardiovascular:  RRR, Nl S1/S2,-M/R/G. Lungs:  Coarse BS diffusely Abdomen:  Soft, NT, ND and +BS Musculoskeletal:  -edema and -tenderness Skin:  Intact  LABS:  BMET Recent Labs  Lab 11/29/17 1643  NA 142  K 3.4*  CL 106  BUN 15  CREATININE 1.40*  GLUCOSE 181*    Electrolytes No results for input(s): CALCIUM, MG, PHOS in the last 168 hours.  CBC Recent Labs  Lab 11/29/17 1638 11/29/17 1643  WBC 10.8*  --   HGB 14.3 14.3  HCT 42.5 42.0  PLT 241  --     Coag's Recent Labs  Lab  11/29/17 1638  INR 1.11    Sepsis Markers No results for input(s): LATICACIDVEN, PROCALCITON, O2SATVEN in the last 168 hours.  ABG No results for input(s): PHART, PCO2ART, PO2ART in the last 168 hours.  Liver Enzymes No results for input(s): AST, ALT, ALKPHOS, BILITOT, ALBUMIN in the last 168 hours.  Cardiac Enzymes No results for input(s): TROPONINI, PROBNP in the last 168 hours.  Glucose No results for input(s): GLUCAP in the last 168 hours.  Imaging Dg Chest Portable 1 View  Result Date: 11/29/2017 CLINICAL DATA:  Status post intubation.  Status post CPR. EXAM: PORTABLE CHEST 1 VIEW COMPARISON:  Radiographs of February 23, 2016. FINDINGS: The heart size and mediastinal contours are within normal limits. Both lungs are clear. Endotracheal and nasogastric tubes are in grossly good position. No pneumothorax or pleural effusion is noted. Old left rib fractures are noted. IMPRESSION: Endotracheal and nasogastric tubes in grossly good position. No acute cardiopulmonary abnormality seen. Electronically Signed   By: Lupita RaiderJames  Green Jr, M.D.   On: 11/29/2017 17:07     STUDIES:  Head CT negative  CULTURES: Blood 12/6>>> Urine 12/6>>> Sputum 12/6>>>  ANTIBIOTICS: Unasyn 12/6>>>  SIGNIFICANT EVENTS: 12/6 cardiac arrest  LINES/TUBES: ETT 12/6>>> OGT 12/6>>> Foley 12/6>>>  DISCUSSION: 56 year old male with no known PMH suffered likely a VF arrest while chopping wood.  Was purposeful post ROSC  so not cooling.  Aspirated and collapsed RUL which was addressed via bronch.  To cath lab.  ASSESSMENT / PLAN:  PULMONARY A: VDRF post arrest RUL collapse with vomit it within it P:   - Full vent support - ABG - Adjust vent for ABG - Bronch for RUL collapse  CARDIOVASCULAR A:  VF arrest HTN P:  - To cath lab - Cards admitting - Tele monitoring - Propofol and if remains hypertensive then will address  RENAL A:   Hypokalemia P:   - Replace electrolyte as indicated - IVF  per cards - BMET in AM - Replace K today  GASTROINTESTINAL A:   No active issues P:   - TF in AM per nutrition  HEMATOLOGIC A:   No active issues P:  CBC in AM  INFECTIOUS A:   Aspiration very likely P:   - Pan culture - Unasyn - PCT  ENDOCRINE A:   ?DM, patient is hyperglycemic   P:   - ISS - CBG  NEUROLOGIC A:   Post arrest, clean head CT Sedation P:   RASS goal: 0 - Propofol drip - Fentanyl drip  FAMILY  - Updates: No family bedside to update  - Inter-disciplinary family meet or Palliative Care meeting due by:  day 7  The patient is critically ill with multiple organ systems failure and requires high complexity decision making for assessment and support, frequent evaluation and titration of therapies, application of advanced monitoring technologies and extensive interpretation of multiple databases.   Critical Care Time devoted to patient care services described in this note is  35  Minutes. This time reflects time of care of this signee Dr Koren BoundWesam Dariush Mcnellis. This critical care time does not reflect procedure time, or teaching time or supervisory time of PA/NP/Med student/Med Resident etc but could involve care discussion time.  Alyson ReedyWesam G. Misk Galentine, M.D. [facility]. Pager: [phone]. After hours pager: [phone].  11/29/2017, 5:30 PM

## 2017-11-29 NOTE — Procedures (Signed)
Bronchoscopy Procedure Note [name] [id number] June 27, 1961  Procedure: Bronchoscopy Indications: Diagnostic evaluation of the airways and Obtain specimens for culture and/or other diagnostic studies  Procedure Details Consent: Unable to obtain consent because of emergent medical necessity. Time Out: Verified patient identification, verified procedure, site/side was marked, verified correct patient position, special equipment/implants available, medications/allergies/relevent history reviewed, required imaging and test results available.  Performed  In preparation for procedure, patient was given 100% FiO2 and bronchoscope lubricated. Sedation: Benzodiazepines, Muscle relaxants and Fentanyl  Airway entered and the following bronchi were examined: RUL, RML, RLL, LUL, LLL and Bronchi.   Vomit material noted obstructing the RUL, was removed and BAL was obtained from there. Bronchoscope removed.  , Patient placed back on 100% FiO2 at conclusion of procedure.    Evaluation Hemodynamic Status: BP stable throughout; O2 sats: stable throughout Patient's Current Condition: stable Specimens:  Sent purulent fluid Complications: No apparent complications Patient did tolerate procedure well.   Jennet Maduro 11/29/2017

## 2017-11-29 NOTE — ED Provider Notes (Signed)
[facility] EMERGENCY DEPARTMENT Provider Note   CSN: [id number] Arrival date & time:        History   Chief Complaint Chief Complaint  Patient presents with  . Cardiac Arrest    HPI [name] is a 56 y.o. male.  HPI 56 year old male with unknown past medical history presenting after a presumed cardiac arrest.  Per EMS he was chopping wood with people who were not familiar with his medical history when he suddenly collapsed.  Bystander CPR started within 1 minute.  When fire department arrived approximately 8 minutes later he was pulseless and apneic.  Lucas device was placed and CPR initiated.  In the ED was placed and he was shocked 1 time.  By the time EMS arrived he had ROSC with sinus tachycardia.  Per the EMS protocol since he was defibrillated once he was given a bolus of lidocaine and placed on a lidocaine drip.  A King airway was placed.  He has not been responsive since the event. EMS EKG with concern for posterior infarct. No family available. No further hx provided.   No past medical history on file.  There are no active problems to display for this patient.     Home Medications    Prior to Admission medications   Not on File    Family History No family history on file.  Social History Social History   Tobacco Use  . Smoking status: Not on file  Substance Use Topics  . Alcohol use: Not on file  . Drug use: Not on file     Allergies   Patient has no known allergies.   Review of Systems Review of Systems  Unable to perform ROS: Patient unresponsive     Physical Exam Updated Vital Signs BP 110/66 (BP Location: Left Arm)   Pulse (!) 149   Resp (!) 26   SpO2 99%   Physical Exam  Constitutional: He appears well-developed and well-nourished. He appears ill.  HENT:  Head: Normocephalic.  73m laceration to R lower lip.  Eyes: Pupils are equal, round, and reactive to light.  Pupils 446mand reactive b/l.  Neck: No  tracheal deviation present.  Cardiovascular: Regular rhythm, S1 normal, S2 normal, normal heart sounds and intact distal pulses. Tachycardia present.  No murmur heard. Pulses:      Radial pulses are 1+ on the right side, and 1+ on the left side.       Femoral pulses are 2+ on the right side, and 2+ on the left side. Neurological: He is unresponsive.  Nursing note and vitals reviewed.    ED Treatments / Results  Labs (all labs ordered are listed, but only abnormal results are displayed) Labs Reviewed  CBC WITH DIFFERENTIAL/PLATELET - Abnormal; Notable for the following components:      Result Value   WBC 10.8 (*)    All other components within normal limits  I-STAT TROPONIN, ED - Abnormal; Notable for the following components:   Troponin i, poc 0.95 (*)    All other components within normal limits  I-STAT CHEM 8, ED - Abnormal; Notable for the following components:   Potassium 3.4 (*)    Creatinine, Ser 1.40 (*)    Glucose, Bld 181 (*)    Calcium, Ion 1.12 (*)    TCO2 19 (*)    All other components within normal limits  COMPREHENSIVE METABOLIC PANEL  PROTIME-INR    EKG  EKG Interpretation None  Radiology Dg Chest Portable 1 View  Result Date: 11/29/2017 CLINICAL DATA:  Status post intubation.  Status post CPR. EXAM: PORTABLE CHEST 1 VIEW COMPARISON:  Radiographs of February 23, 2016. FINDINGS: The heart size and mediastinal contours are within normal limits. Both lungs are clear. Endotracheal and nasogastric tubes are in grossly good position. No pneumothorax or pleural effusion is noted. Old left rib fractures are noted. IMPRESSION: Endotracheal and nasogastric tubes in grossly good position. No acute cardiopulmonary abnormality seen. Electronically Signed   By: Marijo Conception, M.D.   On: 11/29/2017 17:07    Procedures Procedure Name: Intubation Date/Time: 11/29/2017 5:20 PM Performed by: Rosendo Couser Mali, MD Pre-anesthesia Checklist: Emergency Drugs available,  Patient identified, Suction available, Patient being monitored and Timeout performed Oxygen Delivery Method: Non-rebreather mask Induction Type: Rapid sequence Laryngoscope Size: Mac and 3 Grade View: Grade II Tube size: 8.0 mm Number of attempts: 1 Airway Equipment and Method: Video-laryngoscopy and Rigid stylet Placement Confirmation: ETT inserted through vocal cords under direct vision,  Positive ETCO2 and Breath sounds checked- equal and bilateral Secured at: 24 cm Tube secured with: ETT holder      (including critical care time)  Medications Ordered in ED Medications  etomidate (AMIDATE) injection (20 mg Intravenous Given 11/29/17 1654)  rocuronium (ZEMURON) injection (80 mg Intravenous Given 11/29/17 1654)  0.9 %  sodium chloride infusion (1,000 mLs Intravenous New Bag/Given 11/29/17 1649)  fentaNYL (SUBLIMAZE) injection (50 mcg Intravenous Given 11/29/17 1659)  propofol (DIPRIVAN) 1000 MG/100ML infusion (not administered)  fentaNYL (SUBLIMAZE) 100 MCG/2ML injection (not administered)  propofol (DIPRIVAN) 10 mg/mL bolus/IV push (10 mg Intravenous Given 11/29/17 1704)     Initial Impression / Assessment and Plan / ED Course  I have reviewed the triage vital signs and the nursing notes.  Pertinent labs & imaging results that were available during my care of the patient were reviewed by me and considered in my medical decision making (see chart for details).     56 year old male presenting after cardiac arrest.  History is as above.  On arrival he has intact pulses to bilateral radials with a blood pressure in the 166A systolic on manual BP.  King airway in place with bilateral breath sounds.  Patient spontaneously moving all 4 extremities but not following commands.  Pupils equal and reactive bilaterally.  Abdomen benign.  EKG from EMS concerning for ST depressions throughout.  Code STEMI called in cardiology came to bedside.  EKG performed on arrival and showed diffuse ST  depression.  Patient appeared to be localizing to pain and thought to be grabbing at the [facility] airway therefore intubation supplies were at bedside the patient was extubated to assess mental status.  After extubation the patient was very agitated and not following commands.  Difficult to assess if he was causing the pain versus posturing.  The patient was then intubated under RSI with etomidate and rock.  Placed on propofol drip for sedation.  Chest x-ray showed no obvious pneumothorax, cardiomegaly, widened mediastinum and tube in proper position.  As he fell and unsure if he hit his head or not, will hold aspirin and heparin until CT head and cervical spine performed.  Patient had no obvious signs of DVT on exam and he was not hypoxic, lower suspicion for PE.    CT is negative for ICH or fracture therefore he was taken to the Cath Lab for left heart cath.  He was given aspirin and heparin.  Final Clinical Impressions(s) / ED Diagnoses  Final diagnoses:  History of ETT  On intra-aortic balloon pump assist  On intra-aortic balloon pump assist    ED Discharge Orders    None       Salbador Fiveash Mali, MD 11/29/17 2041    Gareth Morgan, MD 11/30/17 1441

## 2017-11-29 NOTE — H&P (Addendum)
Cardiology Admission History and Physical:   Patient ID: [name]; MRN: [id number]; DOB: 12/12/1961   Admission date: 11/29/2017  Primary Care Provider: No primary care provider on file. Primary Cardiologist: New   Chief Complaint:  S/P cardiac arrest  Patient Profile:   [name] is a 56 y.o. male with unknown PMH (no prior records and no family available) admitted following presumed cardiac arrest.  History of Present Illness:   [name] 56 year old male with unknown past medical history admitted following cardiac arrest.  Patient was chopping wood today.  He apparently had witnessed loss of consciousness.  Bystanders performed CPR.  The fire department was called and placed an AED.  Patient was shocked but initial rhythm unclear.  He was brought to the emergency room and was combative and could not provide a history.  He was intubated and sedated.  His electrocardiogram shows diffuse ST depression.  Cardiology asked to evaluate.   PMH; unknown; see HPI; no records or history available  Medications Prior to Admission: Prior to Admission medications   Unknown     Allergies:  Unknown  Social History:   Unknown  Family History:   Unknown  ROS:  Unable to obtain as pt is s/p cardiac arrest; intubated and sedated  Physical Exam/Data:   Vitals:   11/29/17 1636 11/29/17 1640 11/29/17 1642  BP:  110/66   Pulse: (!) 124  (!) 149  Resp: (!) 26  (!) 26  SpO2: 100%  99%    General:  Well nourished, well developed, intubated and sedated HEENT: normal Neck: Neck collar in place Vascular: 2+ carotid bruits, no JVD Cardiac:  RRR, no m/r/g Lungs:  Mild rhonchi Abd: soft, ND, no HSM, no masses Ext: no edema Musculoskeletal:  No deformities Skin: warm and dry  Neuro:  Moved all ext prior to intubation; otherwise not obtainable    EKG:  ECG personally reviewed and shows sinus tachy with PVC, diffuse ST depression   Laboratory  Data:  Chemistry Recent Labs  Lab 11/29/17 1643  NA 142  K 3.4*  CL 106  GLUCOSE 181*  BUN 15  CREATININE 1.40*    Hematology Recent Labs  Lab 11/29/17 1638 11/29/17 1643  WBC 10.8*  --   RBC 4.38  --   HGB 14.3 14.3  HCT 42.5 42.0  MCV 97.0  --   MCH 32.6  --   MCHC 33.6  --   RDW 13.0  --   PLT 241  --     Recent Labs  Lab 11/29/17 1642  TROPIPOC 0.95*     Radiology/Studies:  Dg Chest Portable 1 View  Result Date: 11/29/2017 CLINICAL DATA:  Status post intubation.  Status post CPR. EXAM: PORTABLE CHEST 1 VIEW COMPARISON:  Radiographs of February 23, 2016. FINDINGS: The heart size and mediastinal contours are within normal limits. Both lungs are clear. Endotracheal and nasogastric tubes are in grossly good position. No pneumothorax or pleural effusion is noted. Old left rib fractures are noted. IMPRESSION: Endotracheal and nasogastric tubes in grossly good position. No acute cardiopulmonary abnormality seen. Electronically Signed   By: Lupita RaiderJames  Green Jr, M.D.   On: 11/29/2017 17:07    Assessment and Plan:   1. Status post presumed cardiac arrest-patient had witnessed loss of consciousness and received CPR.  AED was placed and patient was defibrillated but rhythm strip not available.  His electrocardiogram shows diffuse ST depression suggestive of diffuse ischemia.  Because of his combativeness and decreased level of  consciousness a head CT was performed which showed no bleed.  Patient will require emergent cardiac catheterization.  No family is available at the time of this evaluation and the patient cannot provide consent.  However the procedure is deemed emergent.  He will need aspirin.  If coronary artery disease demonstrated he will need a statin as well.  We are attempting to contact family members. Cycle enzymes. 2. Ventilator dependent respiratory failure-patient was intubated due to combativeness and decreased level of consciousness for airway protection.  Critical care  medicine following. 3. Renal insufficiency-creatinine is 1.4.  No baseline for comparison.  Follow renal function closely following cardiac catheterization and cardiac arrest. 4. Elevated glucose-check hemoglobin A1c.  Follow CBG. 5. Hypokalemia-supplement.  Severity of Illness: The appropriate patient status for this patient is INPATIENT. Inpatient status is judged to be reasonable and necessary in order to provide the required intensity of service to ensure the patient's safety. The patient's presenting symptoms, physical exam findings, and initial radiographic and laboratory data in the context of their chronic comorbidities is felt to place them at high risk for further clinical deterioration. Furthermore, it is not anticipated that the patient will be medically stable for discharge from the hospital within 2 midnights of admission. The following factors support the patient status of inpatient.   " The patient's presenting symptoms include cardiac arrest. " The worrisome physical exam findings include intubated and sedated. " The initial radiographic and laboratory data are worrisome because of elevated tropoonin. " The chronic co-morbidities include unknown.   * I certify that at the point of admission it is my clinical judgment that the patient will require inpatient hospital care spanning beyond 2 midnights from the point of admission due to high intensity of service, high risk for further deterioration and high frequency of surveillance required.*    For questions or updates, please contact [facility] Please consult [email or web address] for contact info under Cardiology/STEMI.   1 hour critical care time: pt s/p cardiac arrest requiring intubation; coordination with ER physician, interventional cardiology and cath lab. Pt also confused and required urgent head CT. Signed, Olga MillersBrian Crenshaw, MD  11/29/2017 5:23 PM

## 2017-11-30 ENCOUNTER — Encounter (HOSPITAL_COMMUNITY): Payer: Self-pay | Admitting: Internal Medicine

## 2017-11-30 ENCOUNTER — Inpatient Hospital Stay (HOSPITAL_COMMUNITY): Payer: Medicaid Other

## 2017-11-30 DIAGNOSIS — R570 Cardiogenic shock: Secondary | ICD-10-CM

## 2017-11-30 DIAGNOSIS — I2510 Atherosclerotic heart disease of native coronary artery without angina pectoris: Secondary | ICD-10-CM

## 2017-11-30 DIAGNOSIS — I509 Heart failure, unspecified: Secondary | ICD-10-CM

## 2017-11-30 LAB — ECHOCARDIOGRAM COMPLETE
AVLVOTPG: 4 mmHg
Area-P 1/2: 3.28 cm2
E decel time: 229 msec
E/e' ratio: 25.72
FS: 24 % — AB (ref 28–44)
IVS/LV PW RATIO, ED: 0.92
LA diam end sys: 28 mm
LA vol: 41.1 mL
LASIZE: 28 mm
LAVOLA4C: 32.9 mL
LDCA: 3.14 cm2
LV E/e'average: 25.72
LV PW d: 12 mm — AB (ref 0.6–1.1)
LV TDI E'MEDIAL: 6.74
LV e' LATERAL: 2.83 cm/s
LVEEMED: 25.72
LVOT VTI: 16.7 cm
LVOT diameter: 20 mm
LVOT peak vel: 95.8 cm/s
LVOTSV: 52 mL
MV Dec: 229
MV Peak grad: 2 mmHg
MV pk A vel: 85.4 m/s
MV pk E vel: 72.8 m/s
MVSPHT: 67 ms
RV LATERAL S' VELOCITY: 18 cm/s
RV TAPSE: 20.4 mm
TDI e' lateral: 2.83
WEIGHTICAEL: 2504.43 [oz_av]

## 2017-11-30 LAB — POCT I-STAT 3, ART BLOOD GAS (G3+)
Acid-base deficit: 9 mmol/L — ABNORMAL HIGH (ref 0.0–2.0)
Bicarbonate: 17.7 mmol/L — ABNORMAL LOW (ref 20.0–28.0)
O2 Saturation: 91 %
PCO2 ART: 38.7 mmHg (ref 32.0–48.0)
PH ART: 7.269 — AB (ref 7.350–7.450)
TCO2: 19 mmol/L — ABNORMAL LOW (ref 22–32)
pO2, Arterial: 70 mmHg — ABNORMAL LOW (ref 83.0–108.0)

## 2017-11-30 LAB — PNEUMOCYSTIS JIROVECI SMEAR BY DFA: PNEUMOCYSTIS JIROVECI AG: NEGATIVE

## 2017-11-30 LAB — BLOOD GAS, ARTERIAL
Acid-base deficit: 2.1 mmol/L — ABNORMAL HIGH (ref 0.0–2.0)
Bicarbonate: 21.6 mmol/L (ref 20.0–28.0)
Drawn by: 511331
FIO2: 60
LHR: 18 {breaths}/min
MECHVT: 620 mL
O2 Saturation: 99 %
PATIENT TEMPERATURE: 98.6
PCO2 ART: 33 mmHg (ref 32.0–48.0)
PEEP: 8 cmH2O
PH ART: 7.431 (ref 7.350–7.450)
PO2 ART: 159 mmHg — AB (ref 83.0–108.0)

## 2017-11-30 LAB — BASIC METABOLIC PANEL
ANION GAP: 8 (ref 5–15)
Anion gap: 8 (ref 5–15)
BUN: 12 mg/dL (ref 6–20)
BUN: 9 mg/dL (ref 6–20)
CALCIUM: 8.1 mg/dL — AB (ref 8.9–10.3)
CO2: 22 mmol/L (ref 22–32)
CO2: 20 mmol/L — ABNORMAL LOW (ref 22–32)
Calcium: 7.3 mg/dL — ABNORMAL LOW (ref 8.9–10.3)
Chloride: 109 mmol/L (ref 101–111)
Chloride: 109 mmol/L (ref 101–111)
Creatinine, Ser: 1.33 mg/dL — ABNORMAL HIGH (ref 0.61–1.24)
Creatinine, Ser: 1.22 mg/dL (ref 0.61–1.24)
GFR calc Af Amer: >60 mL/min (ref >60)
GFR, EST NON AFRICAN AMERICAN: 59 mL/min — AB (ref >60)
GLUCOSE: 148 mg/dL — AB (ref 65–99)
Glucose, Bld: 117 mg/dL — ABNORMAL HIGH (ref 65–99)
POTASSIUM: 3 mmol/L — AB (ref 3.5–5.1)
Potassium: 4.6 mmol/L (ref 3.5–5.1)
SODIUM: 137 mmol/L (ref 135–145)
Sodium: 139 mmol/L (ref 135–145)

## 2017-11-30 LAB — CBC
HCT: 39.7 % (ref 39.0–52.0)
Hemoglobin: 13.8 g/dL (ref 13.0–17.0)
MCH: 33.2 pg (ref 26.0–34.0)
MCHC: 34.8 g/dL (ref 30.0–36.0)
MCV: 95.4 fL (ref 78.0–100.0)
PLATELETS: 240 10*3/uL (ref 150–400)
RBC: 4.16 MIL/uL — ABNORMAL LOW (ref 4.22–5.81)
RDW: 13.3 % (ref 11.5–15.5)
WBC: 16.8 10*3/uL — ABNORMAL HIGH (ref 4.0–10.5)

## 2017-11-30 LAB — URINALYSIS, ROUTINE W REFLEX MICROSCOPIC
Bacteria, UA: NONE SEEN
Bilirubin Urine: NEGATIVE
GLUCOSE, UA: NEGATIVE mg/dL
Ketones, ur: 20 mg/dL — AB
Leukocytes, UA: NEGATIVE
Nitrite: NEGATIVE
PH: 5 (ref 5.0–8.0)
Protein, ur: NEGATIVE mg/dL
Specific Gravity, Urine: 1.023 (ref 1.005–1.030)

## 2017-11-30 LAB — HEPARIN LEVEL (UNFRACTIONATED)
HEPARIN UNFRACTIONATED: 0.27 [IU]/mL — AB (ref 0.30–0.70)
HEPARIN UNFRACTIONATED: 0.22 [IU]/mL — AB (ref 0.30–0.70)
Heparin Unfractionated: 0.56 IU/mL (ref 0.30–0.70)

## 2017-11-30 LAB — PROCALCITONIN
Procalcitonin: 1.74 ng/mL
Procalcitonin: 1.94 ng/mL

## 2017-11-30 LAB — COOXEMETRY PANEL
Carboxyhemoglobin: 1.3 % (ref 0.5–1.5)
Methemoglobin: 1.1 % (ref 0.0–1.5)
O2 SAT: 76 %
TOTAL HEMOGLOBIN: 13.9 g/dL (ref 12.0–16.0)
Total oxygen content: 76 mL/dL — ABNORMAL HIGH (ref 15.0–23.0)

## 2017-11-30 LAB — POCT I-STAT 3, VENOUS BLOOD GAS (G3P V)
Acid-base deficit: 2 mmol/L (ref 0.0–2.0)
BICARBONATE: 23.7 mmol/L (ref 20.0–28.0)
O2 Saturation: 56 %
PH VEN: 7.339 (ref 7.250–7.430)
PO2 VEN: 31 mmHg — AB (ref 32.0–45.0)
TCO2: 25 mmol/L (ref 22–32)
pCO2, Ven: 44 mmHg (ref 44.0–60.0)

## 2017-11-30 LAB — GLUCOSE, CAPILLARY
GLUCOSE-CAPILLARY: 135 mg/dL — AB (ref 65–99)
GLUCOSE-CAPILLARY: 107 mg/dL — AB (ref 65–99)
GLUCOSE-CAPILLARY: 128 mg/dL — AB (ref 65–99)
GLUCOSE-CAPILLARY: 117 mg/dL — AB (ref 65–99)
Glucose-Capillary: 126 mg/dL — ABNORMAL HIGH (ref 65–99)
Glucose-Capillary: 130 mg/dL — ABNORMAL HIGH (ref 65–99)

## 2017-11-30 LAB — PHOSPHORUS
PHOSPHORUS: 2 mg/dL — AB (ref 2.5–4.6)
Phosphorus: 2.6 mg/dL (ref 2.5–4.6)
Phosphorus: 1.8 mg/dL — ABNORMAL LOW (ref 2.5–4.6)

## 2017-11-30 LAB — MAGNESIUM
MAGNESIUM: 2.2 mg/dL (ref 1.7–2.4)
Magnesium: 2.4 mg/dL (ref 1.7–2.4)
Magnesium: 2 mg/dL (ref 1.7–2.4)

## 2017-11-30 MED ORDER — PERFLUTREN LIPID MICROSPHERE
1.0000 mL | INTRAVENOUS | Status: AC | PRN
  Administered 2017-11-30: 2 mL via INTRAVENOUS
  Filled 2017-11-30: qty 10

## 2017-11-30 MED ORDER — SODIUM CHLORIDE 0.9% FLUSH
10.0000 mL | INTRAVENOUS | Status: DC | PRN
  Administered 2017-11-30: 10 mL
  Administered 2017-12-02: 20 mL
  Filled 2017-11-30 (×2): qty 40

## 2017-11-30 MED ORDER — VITAL HIGH PROTEIN PO LIQD: 1000.0000 mL | ORAL | Status: DC

## 2017-11-30 MED ORDER — VITAL AF 1.2 CAL PO LIQD
1000.0000 mL | ORAL | Status: DC
  Administered 2017-11-30: 1000 mL

## 2017-11-30 MED ORDER — PANTOPRAZOLE SODIUM 40 MG PO PACK
40.0000 mg | PACK | Freq: Every day | ORAL | Status: DC
  Administered 2017-11-30 – 2017-12-01 (×2): 40 mg
  Filled 2017-11-30 (×2): qty 20

## 2017-11-30 MED ORDER — ATORVASTATIN CALCIUM 80 MG PO TABS
80.0000 mg | ORAL_TABLET | Freq: Every day | ORAL | Status: DC
  Administered 2017-11-30 – 2017-12-02 (×3): 80 mg
  Filled 2017-11-30 (×3): qty 1

## 2017-11-30 MED ORDER — PROPOFOL 1000 MG/100ML IV EMUL
0.0000 ug/kg/min | INTRAVENOUS | Status: DC
Start: 2017-11-30 — End: 2017-11-30

## 2017-11-30 MED ORDER — SODIUM CHLORIDE 0.9 % IV BOLUS (SEPSIS)
500.0000 mL | Freq: Once | INTRAVENOUS | Status: AC
  Administered 2017-11-30: 500 mL via INTRAVENOUS

## 2017-11-30 MED ORDER — HEPARIN (PORCINE) IN NACL 100-0.45 UNIT/ML-% IJ SOLN
1100.0000 [IU]/h | INTRAMUSCULAR | Status: DC
  Administered 2017-11-30: 850 [IU]/h via INTRAVENOUS
  Filled 2017-11-30 (×2): qty 250

## 2017-11-30 MED ORDER — NOREPINEPHRINE BITARTRATE 1 MG/ML IV SOLN
0.0000 ug/min | INTRAVENOUS | Status: DC
  Administered 2017-11-30: 18 ug/min via INTRAVENOUS
  Administered 2017-12-02 – 2017-12-05 (×2): 2 ug/min via INTRAVENOUS
  Filled 2017-11-30 (×4): qty 16

## 2017-11-30 MED ORDER — CHLORHEXIDINE GLUCONATE CLOTH 2 % EX PADS
6.0000 | MEDICATED_PAD | Freq: Every day | CUTANEOUS | Status: DC
  Administered 2017-12-01 – 2017-12-06 (×6): 6 via TOPICAL

## 2017-11-30 MED ORDER — POTASSIUM CHLORIDE 10 MEQ/50ML IV SOLN
10.0000 meq | INTRAVENOUS | Status: AC
  Administered 2017-11-30 (×4): 10 meq via INTRAVENOUS
  Filled 2017-11-30 (×4): qty 50

## 2017-11-30 MED ORDER — PRO-STAT SUGAR FREE PO LIQD: 30.0000 mL | Freq: Two times a day (BID) | ORAL | Status: DC

## 2017-11-30 MED ORDER — MILRINONE LACTATE IN DEXTROSE 20-5 MG/100ML-% IV SOLN
0.3750 ug/kg/min | INTRAVENOUS | Status: DC
  Administered 2017-11-30: 0.375 ug/kg/min via INTRAVENOUS
  Administered 2017-12-01: 0.385 ug/kg/min via INTRAVENOUS
  Administered 2017-12-01 – 2017-12-03 (×5): 0.375 ug/kg/min via INTRAVENOUS
  Filled 2017-11-30 (×7): qty 100

## 2017-11-30 MED ORDER — ACETAMINOPHEN 160 MG/5ML PO SOLN
650.0000 mg | ORAL | Status: DC | PRN
  Administered 2017-11-30 – 2017-12-04 (×9): 650 mg
  Filled 2017-11-30 (×8): qty 20.3

## 2017-11-30 MED ORDER — ASPIRIN 81 MG PO CHEW
81.0000 mg | CHEWABLE_TABLET | Freq: Every day | ORAL | Status: DC
  Administered 2017-11-30 – 2017-12-02 (×3): 81 mg
  Filled 2017-11-30 (×3): qty 1

## 2017-11-30 NOTE — Progress Notes (Signed)
Advanced Heart Failure Progress   Primary Cardiologist:  New to [place] (Dr. Saunders Revel) Primary HF: New (Dr. Haroldine Laws)  Reason for Consultation: VT/VF arrest  HPI:    Formal echo pending.  Pt intubated and sedated. History obtained from chart.  Opens eyes to voice.   IABP 1:1. Augmented pressures 793 systolic by ART line.  Coox 72% on milrinone 0.375 mcg/kg/min and norepi 20.   Swan as of 0800 CVP 3 (Personally performed at bedside) PAP 21/10 (14) PCWP 7 CO 3.8 CI 1.9  RHC 11/29/17 Done on IABP and milrinone 0.25 mcg/kg/min  RA = 6 RV = 22/7 PA = 22/12 (16) PCW = 11 Fick cardiac output/index = 3.4/1.6 PVR = 1.1 WU Ao sat = 92% PA sat = 51%, 56%  LHC 11/29/17 Left Main  Dist LM lesion 40% stenosed  Left Anterior Descending  Prox LAD-1 lesion is 60% stenosed. The lesion is calcified.  Prox LAD-2 lesion 80% stenosed  First Diagonal Branch  Vessel is small in size.  Ost 1st Diag to 1st Diag lesion 70% stenosed  Second Diagonal Branch  Vessel is small in size. There is mild disease in the vessel.  Left Circumflex  Ost Cx lesion 30% stenosed  Prox Cx lesion is 80% stenosed. The lesion is calcified.  Prox Cx to Mid Cx lesion 80% stenosed  First Obtuse Marginal Branch  Vessel is small in size.  Ost 1st Mrg lesion 90% stenosed  Second Obtuse Marginal Branch  Ost 2nd Mrg to 2nd Mrg lesion 60% stenosed  Right Coronary Artery  Collaterals  Mid RCA filled by collaterals from Prox RCA.    Prox RCA to Mid RCA lesion is 100% stenosed. The lesion is chronically occluded with bridging and left-to-right collateral flow.  Right Posterior Descending Artery  Collaterals  RPDA filled by collaterals from 2nd Sept.     Objective:    Vital Signs:   Temp:  [98.8 F (37.1 C)-99.5 F (37.5 C)] 99.1 F (37.3 C) (12/07 0800) Pulse Rate:  [0-281] 88 (12/07 0800) Resp:  [0-91] 18 (12/07 0800) BP: (82-178)/(34-128) 97/56 (12/07 0800) SpO2:  [0 %-100 %] 100 % (12/07  0800) Arterial Line BP: (48-140)/(34-53) 89/38 (12/07 0800) FiO2 (%):  [40 %-100 %] 40 % (12/07 0800) Weight:  [156 lb 8.4 oz (71 kg)-191 lb 12.8 oz (87 kg)] 156 lb 8.4 oz (71 kg) (12/07 0500) Last BM Date: (Prior to admission)  Weight change: Filed Weights   11/29/17 1804 11/30/17 0500  Weight: 191 lb 12.8 oz (87 kg) 156 lb 8.4 oz (71 kg)    Intake/Output:   Intake/Output Summary (Last 24 hours) at 11/30/2017 0836 Last data filed at 11/30/2017 0830 Gross per 24 hour  Intake 1196.8 ml  Output 2490 ml  Net -1293.2 ml      Physical Exam    General: Intubated/sedated. Opens eyes to voice.  HEENT: + ETT Neck: Supple. JVP ~8. Carotids 2+ bilat; no bruits. No thyromegaly or nodule noted. Cor: PMI nondisplaced. RRR, No M/G/R noted Lungs: Diminished basilar sounds Abdomen: Soft, non-tender, non-distended, no HSM. No bruits or masses. +BS  Extremities: No cyanosis, clubbing, or rash. Trace ankle edema.  Neuro: Intubated/sedated   Telemetry   NSR 80-90s, Personally reviewed.   EKG    11/29/17 Sinus tach 135  Labs   Basic Metabolic Panel: Recent Labs  Lab 11/29/17 1638 11/29/17 1643 11/29/17 2055 11/30/17 0350  NA 139 142 139 139  K 3.4* 3.4* 3.3* 4.6  CL 106 106  105 109  CO2 18*  --  20* 22  GLUCOSE 181* 181* 156* 148*  BUN '14 15 14 12  ' CREATININE 1.69* 1.40* 1.49* 1.33*  CALCIUM 9.0  --  8.0* 8.1*  MG  --   --   --  2.4  PHOS  --   --   --  2.6    Liver Function Tests: Recent Labs  Lab 11/29/17 1638  AST 78*  ALT 37  ALKPHOS 45  BILITOT 1.0  PROT 6.6  ALBUMIN 4.1   No results for input(s): LIPASE, AMYLASE in the last 168 hours. No results for input(s): AMMONIA in the last 168 hours.  CBC: Recent Labs  Lab 11/29/17 1638 11/29/17 1643 11/30/17 0610  WBC 10.8*  --  16.8*  NEUTROABS 6.5  --   --   HGB 14.3 14.3 13.8  HCT 42.5 42.0 39.7  MCV 97.0  --  95.4  PLT 241  --  240    Cardiac Enzymes: No results for input(s): CKTOTAL, CKMB,  CKMBINDEX, TROPONINI in the last 168 hours.  BNP: BNP (last 3 results) No results for input(s): BNP in the last 8760 hours.  ProBNP (last 3 results) No results for input(s): PROBNP in the last 8760 hours.   CBG: Recent Labs  Lab 11/29/17 2108 11/29/17 2322 11/30/17 0343 11/30/17 0722  GLUCAP 131* 112* 135* 126*    Coagulation Studies: Recent Labs    11/29/17 1638  LABPROT 14.2  INR 1.11    Imaging   Ct Head Wo Contrast  Result Date: 11/29/2017 CLINICAL DATA:  Found down.  Status post intubation. EXAM: CT HEAD WITHOUT CONTRAST CT CERVICAL SPINE WITHOUT CONTRAST TECHNIQUE: Multidetector CT imaging of the head and cervical spine was performed following the standard protocol without intravenous contrast. Multiplanar CT image reconstructions of the cervical spine were also generated. COMPARISON:  02/23/2016 FINDINGS: CT HEAD FINDINGS Brain: There is no evidence for acute hemorrhage, hydrocephalus, mass lesion, or abnormal extra-axial fluid collection. No definite CT evidence for acute infarction. Diffuse loss of parenchymal volume is consistent with atrophy. Patchy low attenuation in the deep hemispheric and periventricular white matter is nonspecific, but likely reflects chronic microvascular ischemic demyelination. Lacunar infarct noted left cerebellar hemisphere. Age-indeterminate lacunar infarcts identified in the basal ganglia bilaterally. Vascular: No hyperdense vessel or unexpected calcification. Skull: No evidence for fracture. No worrisome lytic or sclerotic lesion. Sinuses/Orbits: The visualized paranasal sinuses and mastoid air cells are clear. Visualized portions of the globes and intraorbital fat are unremarkable. Other: NG tube and endotracheal tube visualized incompletely. CT CERVICAL SPINE FINDINGS Alignment: Straightening of normal cervical lordosis. Skull base and vertebrae: No evidence for an acute fracture. No subluxation. Soft tissues and spinal canal: No prevertebral  fluid or swelling. No visible canal hematoma. Disc levels: Loss of intervertebral disc height is noted at C4-5, C5-6, and C6-7. Scattered areas of mild facet osteoarthritis are noted bilaterally. Upper chest: Dense consolidation is identified in the right lung apex with patchy airspace opacity identified in the left apex. Other: None. IMPRESSION: 1. No acute intracranial abnormality. Atrophy with chronic small vessel white matter ischemic demyelination. 2. No evidence for cervical spine fracture. 3. Loss of cervical lordosis. This can be related to patient positioning, muscle spasm or soft tissue injury. 4. Dense airspace consolidation right lung apex with patchy airspace disease in the left lung apex. Patient is having chest CT performed at the same time which will better characterize lung findings. Electronically Signed   By: Misty Stanley  M.D.   On: 11/29/2017 17:44   Ct Chest Wo Contrast  Result Date: 11/29/2017 CLINICAL DATA:  Patient was found down for 5-6 minutes. Intubated and shocked. EXAM: CT CHEST WITHOUT CONTRAST TECHNIQUE: Multidetector CT imaging of the chest was performed following the standard protocol without IV contrast. COMPARISON:  Same day CXR FINDINGS: Cardiovascular: Heart is borderline enlarged without pericardial effusion. Left main, LAD, left circumflex and RCA coronary arteriosclerosis. There is mild aortic atherosclerosis without aneurysm. Mediastinum/Nodes: A gastric tube is noted with tip and side-port coiled in the stomach. Go no axillary, supraclavicular nor mediastinal lymphadenopathy. Assessment of the hilar for lymphadenopathy is limited due to lack of IV contrast. The thyroid gland is not enlarged and there appear to be a few small subcentimeter hypodense nodules on the left, the largest approximately 5 mm. Lungs/Pleura: The tip of an endotracheal tube is seen at the level of the aortic arch, above the carina in satisfactory position. Patchy alveolar consolidations are noted  in both lower lobes, lingula, and posterior left upper lobe possibly from aspiration. Confluent atelectasis in the right upper lobe is identified without air bronchograms which is new since the earlier same day CXR and findings likely are related to endobronchial obstruction possibly from mucous plugging. Upper Abdomen: No acute abnormality. Musculoskeletal: Acute and chronic bilateral rib fractures are noted, the acute fractures involving the left anterior second, third, lateral fourth through sixth ribs and on the right involving the third through sixth ribs. IMPRESSION: 1. New right upper lobe atelectasis accounting for confluent pulmonary consolidation without air bronchograms since the earlier same day CXR. Findings are likely related to mucous plugging given the acuity of this finding. Endotracheal tube is in satisfactory position. 2. Patchy multilobar airspace consolidations in both lower lobes, lingula and left upper lobe compatible with aspiration. 3. Bilateral acute and chronic rib fractures without associated pneumothorax. 4. Left main and three-vessel coronary arteriosclerosis and mild aortic atherosclerosis. Aortic Atherosclerosis (ICD10-I70.0). Electronically Signed   By: Ashley Royalty M.D.   On: 11/29/2017 17:55   Ct Cervical Spine Wo Contrast  Result Date: 11/29/2017 CLINICAL DATA:  Found down.  Status post intubation. EXAM: CT HEAD WITHOUT CONTRAST CT CERVICAL SPINE WITHOUT CONTRAST TECHNIQUE: Multidetector CT imaging of the head and cervical spine was performed following the standard protocol without intravenous contrast. Multiplanar CT image reconstructions of the cervical spine were also generated. COMPARISON:  02/23/2016 FINDINGS: CT HEAD FINDINGS Brain: There is no evidence for acute hemorrhage, hydrocephalus, mass lesion, or abnormal extra-axial fluid collection. No definite CT evidence for acute infarction. Diffuse loss of parenchymal volume is consistent with atrophy. Patchy low  attenuation in the deep hemispheric and periventricular white matter is nonspecific, but likely reflects chronic microvascular ischemic demyelination. Lacunar infarct noted left cerebellar hemisphere. Age-indeterminate lacunar infarcts identified in the basal ganglia bilaterally. Vascular: No hyperdense vessel or unexpected calcification. Skull: No evidence for fracture. No worrisome lytic or sclerotic lesion. Sinuses/Orbits: The visualized paranasal sinuses and mastoid air cells are clear. Visualized portions of the globes and intraorbital fat are unremarkable. Other: NG tube and endotracheal tube visualized incompletely. CT CERVICAL SPINE FINDINGS Alignment: Straightening of normal cervical lordosis. Skull base and vertebrae: No evidence for an acute fracture. No subluxation. Soft tissues and spinal canal: No prevertebral fluid or swelling. No visible canal hematoma. Disc levels: Loss of intervertebral disc height is noted at C4-5, C5-6, and C6-7. Scattered areas of mild facet osteoarthritis are noted bilaterally. Upper chest: Dense consolidation is identified in the right lung apex with  patchy airspace opacity identified in the left apex. Other: None. IMPRESSION: 1. No acute intracranial abnormality. Atrophy with chronic small vessel white matter ischemic demyelination. 2. No evidence for cervical spine fracture. 3. Loss of cervical lordosis. This can be related to patient positioning, muscle spasm or soft tissue injury. 4. Dense airspace consolidation right lung apex with patchy airspace disease in the left lung apex. Patient is having chest CT performed at the same time which will better characterize lung findings. Electronically Signed   By: Misty Stanley M.D.   On: 11/29/2017 17:44   Dg Chest Port 1 View  Result Date: 11/29/2017 CLINICAL DATA:  Aortic balloon assist device. EXAM: PORTABLE CHEST 1 VIEW COMPARISON:  Chest radiograph November 29 2017 at 1630 hours FINDINGS: Intra-aortic balloon pump tip  immediately below the aortic knob. Swan-Ganz catheter via RIGHT internal jugular venous approach with distal tip projecting in main pulmonary artery. Nasogastric tube tip and side-port project in proximal stomach. Endotracheal tube tip projects 4.8 cm above the carina. Multiple EKG lines overlie the patient and may obscure subtle underlying pathology. Cardiac silhouette is upper limits of normal in size. Mediastinal silhouette is nonsuspicious, mildly calcified aortic knob. Faint midlung zone airspace opacity, faint RIGHT upper lobe airspace opacity. Small suspected LEFT layering pleural effusion. Bilateral old rib fractures. IMPRESSION: Well-positioned life-support lines. Bilateral airspace opacities seen with atelectasis, confluent edema or pneumonia. Small suspected layering LEFT pleural effusion. Borderline cardiomegaly. Electronically Signed   By: Elon Alas M.D.   On: 11/29/2017 21:06   Dg Chest Portable 1 View  Result Date: 11/29/2017 CLINICAL DATA:  Status post intubation.  Status post CPR. EXAM: PORTABLE CHEST 1 VIEW COMPARISON:  Radiographs of February 23, 2016. FINDINGS: The heart size and mediastinal contours are within normal limits. Both lungs are clear. Endotracheal and nasogastric tubes are in grossly good position. No pneumothorax or pleural effusion is noted. Old left rib fractures are noted. IMPRESSION: Endotracheal and nasogastric tubes in grossly good position. No acute cardiopulmonary abnormality seen. Electronically Signed   By: Marijo Conception, M.D.   On: 11/29/2017 17:07      Medications:     Current Medications: . chlorhexidine gluconate (MEDLINE KIT)  15 mL Mouth Rinse BID  . Chlorhexidine Gluconate Cloth  6 each Topical Daily  . digoxin  0.125 mg Oral Daily  . fentaNYL (SUBLIMAZE) injection  50 mcg Intravenous Once  . insulin aspart  2-6 Units Subcutaneous Q4H  . mouth rinse  15 mL Mouth Rinse QID  . pantoprazole (PROTONIX) IV  40 mg Intravenous Q24H  . sodium  chloride flush  3 mL Intravenous Q12H  . spironolactone  12.5 mg Oral Daily     Infusions: . sodium chloride 10 mL/hr at 11/30/17 0700  . sodium chloride Stopped (11/29/17 2300)  . fentaNYL infusion INTRAVENOUS 100 mcg/hr (11/30/17 0815)  . heparin 850 Units/hr (11/30/17 0815)  . midazolam (VERSED) infusion 1 mg/hr (11/30/17 0700)  . milrinone 0.375 mcg/kg/min (11/30/17 0700)  . norepinephrine (LEVOPHED) Adult infusion 16 mcg/min (11/30/17 0830)  . piperacillin-tazobactam (ZOSYN)  IV 3.375 g (11/30/17 0545)  . propofol (DIPRIVAN) infusion 10 mcg/kg/min (11/30/17 0815)  . vancomycin Stopped (11/29/17 2200)     Patient Profile   [name] is a 56 y.o. male with unknown medical history who presented to [facility]29/17 after VT/VF arrest. Intubated emergently in ER. Chest CT with RUL collapse. Pt underwent bronch and taken emergently to cath lab.  Cath showed Severe 3v CAD and elevated LVEDP.  IABP placed and started on milrinone with low output CHF.   Assessment/Plan   1. VT/VF Arrest -> Cardiogenic shock - IABP in place - On milrinone 0.375 and levophed 20.  - CVP 3-4. Will give 500 cc of fluid.   2. CAD - Severe 3vD as above - Will need CABG once/if recovers - Will need ASA/statin.  3. Acute systolic CHF due to ICM - Low output on cath. Echo pending.  - Avoid BB with low output.  - Continue spiro 12.5 mg daily as tolerated - Continue digoxin 0.125 mg daily.  - No room for additional neurohormonal blockade at this time.   3. Acute respiratory failure with RUL collapse by CT.  - Intubated. CCM following.   Medication concerns reviewed with patient and pharmacy team. Barriers identified: None known at this time. May not have insurance.   Length of Stay: 1  Annamaria Helling  11/30/2017, 8:36 AM  Advanced Heart Failure Team Pager [phone] (M-F; 7a - 4p)  Please contact [facility] for night-coverage after hours (4p -7a ) and weekends on  [email or web address]  Agree with above.   Remains intubated and sedated on IABP. Had profound cardiogenic shock last night. Shock/acidosis now improved on milrinone 0.375, NE 20 and IABP. CXR shows RUL infiltrate c/w aspiration PNA. Now on vanc/zosyn. Moves purposefully but not responding to commands. Swan numbers ok. ECHO reviewed personally at bedside and EF ~30% RV ok.   Exam Intubated/sedated RIJ swan Cor RRR  Lungs coarse Ab soft NT Extr warm no edema. RFA IABP ok. R DP 2+ Neuro sedated. Moves all 4 spontaneously and WDs to pain.   He remains critically ill but is improving. Volume status and output ok on current support. Continue broad spectrum abx. Attempt to wean vent over weekend. Follow renal function closely. D/w CCM at bedside. Dr. Prescott Gum has seen. CABG once infectious issues and mental status issues sorted out. Anticipate keeping IABP in until surgery unless complications arise.   CRITICAL CARE Performed by: Glori Bickers  Total critical care time: 35 minutes  Critical care time was exclusive of separately billable procedures and treating other patients.  Critical care was necessary to treat or prevent imminent or life-threatening deterioration.  Critical care was time spent personally by me (independent of midlevel providers or residents) on the following activities: development of treatment plan with patient and/or surrogate as well as nursing, discussions with consultants, evaluation of patient's response to treatment, examination of patient, obtaining history from patient or surrogate, ordering and performing treatments and interventions, ordering and review of laboratory studies, ordering and review of radiographic studies, pulse oximetry and re-evaluation of patient's condition.  Glori Bickers, MD  5:56 PM

## 2017-11-30 NOTE — Progress Notes (Signed)
ANTICOAGULATION CONSULT NOTE - Follow Up Consult  Pharmacy Consult for Heparin Indication: IABP  No Known Allergies  Patient Measurements: Height: 5\' 9"  (175.3 cm) Weight: 156 lb 8.4 oz (71 kg) IBW/kg (Calculated) : 70.7   Vital Signs: Temp: 99.9 F (37.7 C) (12/07 2245) Temp Source: Core (12/07 1200) BP: 103/62 (12/07 2245) Pulse Rate: 81 (12/07 2245)  Labs: Recent Labs    11/29/17 1638 11/29/17 1643 11/29/17 2055 11/30/17 0350 11/30/17 0610 11/30/17 1530 11/30/17 2211  HGB 14.3 14.3  --   --  13.8  --   --   HCT 42.5 42.0  --   --  39.7  --   --   PLT 241  --   --   --  240  --   --   LABPROT 14.2  --   --   --   --   --   --   INR 1.11  --   --   --   --   --   --   HEPARINUNFRC  --   --   --  0.56  --  0.27* 0.22*  CREATININE 1.69* 1.40* 1.49* 1.33*  --  1.22  --     Estimated Creatinine Clearance: 69.2 mL/min (by C-G formula based on SCr of 1.22 mg/dL).   Medications:  Heparin @ 850 units/hr  Assessment: 56 year old male with witnessed cardiac arrest and immediate CPR and shock by AED. Patient taken immediately to cath and found to have severe multivessel CAD with need for IABP placement. Heparin was started.   Heparin level is 0.22, remains therapeutic x 2 on 850 units/hr, for lower goal of 0.2-0.5 units/ml. No bleeding noted.   Goal of Therapy:  Heparin level 0.2-0.5 units/ml Monitor platelets by anticoagulation protocol: Yes   Plan:  1) Continue heparin to 850 units/hr 2) Daily heparin level and CBC  Noah Delaineuth Yamili Lichtenwalner, RPh Clinical Pharmacist Clinical phone 11/30/2017 until 11PM - [phone] After hours, please call #[phone] 11/30/2017,10:48 PM

## 2017-11-30 NOTE — Progress Notes (Signed)
ANTICOAGULATION CONSULT NOTE - Follow Up Consult  Pharmacy Consult for Heparin Indication: IABP  No Known Allergies  Patient Measurements: Weight: 156 lb 8.4 oz (71 kg)   Vital Signs: Temp: 99 F (37.2 C) (12/07 1045) Temp Source: Core (Comment) (12/07 0800) BP: 112/66 (12/07 1045) Pulse Rate: 76 (12/07 1045)  Labs: Recent Labs    11/29/17 1638 11/29/17 1643 11/29/17 2055 11/30/17 0350 11/30/17 0610  HGB 14.3 14.3  --   --  13.8  HCT 42.5 42.0  --   --  39.7  PLT 241  --   --   --  240  LABPROT 14.2  --   --   --   --   INR 1.11  --   --   --   --   HEPARINUNFRC  --   --   --  0.56  --   CREATININE 1.69* 1.40* 1.49* 1.33*  --     CrCl cannot be calculated (Unknown ideal weight.).   Medications:  Heparin @ 1000 units/hr  Assessment: 56 year old male with witnessed cardiac arrest and immediate CPR and shock by AED. Patient taken immediately to cath and found to have severe multivessel CAD with need for IABP placement. Heparin was started. Initial heparin level is slightly above goal at 0.56. CBC stable. No bleeding noted.   Goal of Therapy:  Heparin level 0.2-0.5 units/ml Monitor platelets by anticoagulation protocol: Yes   Plan:  1) Decrease heparin to 850 units/hr 2) Check heparin level in 6 hours  Fredrik RiggerMarkle, Declan Mier Sue 11/30/2017,11:11 AM

## 2017-11-30 NOTE — Progress Notes (Signed)
  Echocardiogram 2D Echocardiogram has been performed.  Dandrae Kustra T Kellyann Ordway 11/30/2017, 9:37 AM

## 2017-11-30 NOTE — Progress Notes (Signed)
1 Day Post-Op Procedure(s) (LRB): LEFT HEART CATH AND CORONARY ANGIOGRAPHY (N/A) IABP Insertion (N/A) RIGHT HEART CATH (N/A) Subjective: Patient examined, coronary angiogram and 2-D echocardio gram and CT scan of chest images personally reviewed 56 year old brought to the ED after V. fib arrest - 6 minute down time. Initial head CT scan unremarkable. Patient starting to respond Cardiac catheterization demonstrated severe three-vessel coronary disease with ejection fraction 25%. A balloon pump was placed. A PCI was not performed. LVEDP initially was 40 mmHg but after resuscitation and balloon pump his pulmonary wedge pressure is now 12-15. Echocardiogram today shows no significant MR The patient remains intubated with probable bilateral pneumonia-currently on vancomycin and Zosyn He has mild-moderate metabolic acidosis.  Patient is not currently a candidate for CABG Hopefully after his pulmonary and neurologic status improve and he is extubated he could expect benefit from CABG. Will follow. Objective: Vital signs in last 24 hours: Temp:  [98.6 F (37 C)-99.5 F (37.5 C)] 99.3 F (37.4 C) (12/07 1300) Pulse Rate:  [0-281] 72 (12/07 1300) Cardiac Rhythm: Normal sinus rhythm (12/07 1200) Resp:  [0-91] 18 (12/07 1300) BP: (82-178)/(34-128) 121/63 (12/07 1300) SpO2:  [0 %-100 %] 100 % (12/07 1300) Arterial Line BP: (48-145)/(34-58) 127/57 (12/07 1300) FiO2 (%):  [40 %-100 %] 40 % (12/07 1224) Weight:  [156 lb 8.4 oz (71 kg)-191 lb 12.8 oz (87 kg)] 156 lb 8.4 oz (71 kg) (12/07 0500)  Hemodynamic parameters for last 24 hours: PAP: (18-40)/(5-26) 23/11 CVP:  [1 mmHg-14 mmHg] 3 mmHg PCWP:  [7 mmHg-8 mmHg] 8 mmHg CO:  [3.8 L/min-4.7 L/min] 4.7 L/min CI:  [1.9 L/min/m2-2.3 L/min/m2] 2.3 L/min/m2  Intake/Output from previous day: 12/06 0701 - 12/07 0700 In: 1125.7 [I.V.:655.7; NG/GT:70; IV Piggyback:400] Out: 2440 [Urine:2340; Emesis/NG output:100] Intake/Output this shift: Total  I/O In: 705.6 [I.V.:605.6; NG/GT:50; IV Piggyback:50] Out: 175 [Urine:175]       Exam    General- intubated, starting to respond but on IV sedation protocol   Lungs- bilateral coarse breath sounds and rhonchi   Cor- regular rate and rhythm, no murmur , gallop   Abdomen- soft, non-tender   Extremities - warm, non-tender, minimal edema   Neuro-   Review of systems   patient is intubated and sedated and incapable of providing view of systems    Lab Results: Recent Labs    11/29/17 1638 11/29/17 1643 11/30/17 0610  WBC 10.8*  --  16.8*  HGB 14.3 14.3 13.8  HCT 42.5 42.0 39.7  PLT 241  --  240   BMET:  Recent Labs    11/29/17 2055 11/30/17 0350  NA 139 139  K 3.3* 4.6  CL 105 109  CO2 20* 22  GLUCOSE 156* 148*  BUN 14 12  CREATININE 1.49* 1.33*  CALCIUM 8.0* 8.1*    PT/INR:  Recent Labs    11/29/17 1638  LABPROT 14.2  INR 1.11   ABG    Component Value Date/Time   PHART 7.431 11/30/2017 0422   HCO3 21.6 11/30/2017 0422   TCO2 20 (L) 11/29/2017 2050   ACIDBASEDEF 2.1 (H) 11/30/2017 0422   O2SAT 76.0 11/30/2017 0425   CBG (last 3)  Recent Labs    11/30/17 0343 11/30/17 0722 11/30/17 1132  GLUCAP 135* 126* 130*    Assessment/Plan: S/P Procedure(s) (LRB): LEFT HEART CATH AND CORONARY ANGIOGRAPHY (N/A) IABP Insertion (N/A) RIGHT HEART CATH (N/A) Acute V. fib arrest, severe three-vessel coronary disease with severe LV dysfunction and cardiogenic shock   As possible candidate  for later CABG after neurologic and pulmonary status improves. Will follow.   LOS: 1 day    Kathlee Nationseter Van Trigt III 11/30/2017

## 2017-11-30 NOTE — Progress Notes (Signed)
Initial Nutrition Assessment  DOCUMENTATION CODES:   Not applicable  INTERVENTION:   Tube Feeding:  Vital AF 1.2 @ 65 ml/hr, PEPuP Provides 117 g of protein, 1872 kcals, 1264 mL of free water Meets 100% estimated protein and calorie needs  -Recommend supplementing phosphorus  NUTRITION DIAGNOSIS:   Inadequate oral intake related to acute illness as evidenced by NPO status.  GOAL:   Provide needs based on ASPEN/SCCM guidelines  MONITOR:   TF tolerance, Vent status, Labs, Weight trends, I & O's  REASON FOR ASSESSMENT:   Consult Enteral/tube feeding initiation and management  ASSESSMENT:   56 yo male with no known PMH admitted after he collapsed chopping firewood. Pt with VF arrest, VDRF post arrest requiring intubation, RUL collapse    Patient is currently intubated on ventilator support MV: 10.7 L/min Temp (24hrs), Avg:99.1 F (37.3 C), Min:98.6 F (37 C), Max:99.5 F (37.5 C)  12/6 Bronch performed, IABP placed  Unable to obtain diet or weight history on visit, pt sedated  Labs: phosphorus 1.8, magnesium wdl, potassium wdl, Creatinine 1.33, BUN wdl Meds: levophed, fentanyl, versed, milrinone  NUTRITION - FOCUSED PHYSICAL EXAM:  Unable to assess  Diet Order:  Diet NPO time specified  EDUCATION NEEDS:   No education needs have been identified at this time  Skin:  Skin Assessment: Reviewed RN Assessment  Last BM:  no documented BM  Height:   Ht Readings from Last 1 Encounters:  11/30/17 5\' 9"  (1.753 m)    Weight:   Wt Readings from Last 1 Encounters:  11/30/17 156 lb 8.4 oz (71 kg)    Ideal Body Weight:  72.7 kg  BMI:  Body mass index is 23.11 kg/m.  Estimated Nutritional Needs:   Kcal:  1875 kcals  Protein:  107-142 g  Fluid:  >/= 2 L   Romelle Starcherate Suhayla Chisom MS, RD, LDN, CNSC [phone] Pager  [phone] Weekend/On-Call Pager

## 2017-11-30 NOTE — Care Management Note (Addendum)
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  Possible candidate for later CABG after neuro and pulmonary status improves.   12/12 1118 Letha Capeeborah Misti Towle RN, BSN- IABP removed 12/10, then recurrent VF arrest, IABP replaced, conts on neo, milrinone, tentatively plan for CABG 12/14, conts on zosyn for asp pna, possible TPN.     12/17 1122 Letha Capeeborah Delilah Mulgrew RN, BSN - POD 3 CABG, co - ox 57.2, mlrinone weaned to off, temp 100.9,cxr pending , wbc 13.4, sputum cx pending, mildly volume overloaded, conts on vanc/zosyn, ivk . 3, lasix iv x 1.                Action/Plan: NCM will follow for dc needs.   Expected Discharge Date:                  Expected Discharge Plan:     In-House Referral:     Discharge planning Services  CM Consult  Post Acute Care Choice:    Choice offered to:     DME Arranged:    DME Agency:     HH Arranged:    HH Agency:     Status of Service:  In process, will continue to follow  If discussed at Long Length of Stay Meetings, dates discussed:    Additional Comments:  Leone Havenaylor, Kathleene Bergemann [place], RN 11/30/2017, 3:40 PM

## 2017-11-30 NOTE — Progress Notes (Signed)
PULMONARY / CRITICAL CARE MEDICINE   Name: [name] MRN: [id number] DOB: 05-14-1961    ADMISSION DATE:  11/29/2017 CONSULTATION DATE:  11/29/2017  REFERRING MD:  Page EDP  CHIEF COMPLAINT:  Cardiac arrest  HISTORY OF PRESENT ILLNESS:   56 y/o male admitted on 12/6 with a Vfib arrest.  Aspirated.     SUBJECTIVE:  No acute events Making urine Less vasopressor requirement   VITAL SIGNS: BP (!) 97/56 (BP Location: Left Leg)   Pulse 88   Temp 99.1 F (37.3 C) (Core (Comment))   Resp 18   Wt 156 lb 8.4 oz (71 kg)   SpO2 100%   HEMODYNAMICS: PAP: (21-40)/(5-26) 21/10 CVP:  [1 mmHg-14 mmHg] 3 mmHg PCWP:  [7 mmHg-8 mmHg] 7 mmHg CO:  [3.8 L/min-4.3 L/min] 3.8 L/min CI:  [1.9 L/min/m2-2.1 L/min/m2] 1.9 L/min/m2  VENTILATOR SETTINGS: Vent Mode: PRVC FiO2 (%):  [40 %-100 %] 40 % Set Rate:  [18 bmp] 18 bmp Vt Set:  [409 mL] 620 mL PEEP:  [8 cmH20] 8 cmH20 Plateau Pressure:  [15 cmH20-20 cmH20] 20 cmH20  INTAKE / OUTPUT: I/O last 3 completed shifts: In: 1125.7 [I.V.:655.7; NG/GT:70; IV Piggyback:400] Out: 2440 [Urine:2340; Emesis/NG output:100]  PHYSICAL EXAMINATION:  General:  In bed on vent HENT: NCAT ETT in place PULM: Rhonchi B, vent supported breathing CV: RRR, no mgr GI: BS+, soft, nontender MSK: normal bulk and tone Neuro: awake, making purposeful movements but not following commands    LABS:  BMET Recent Labs  Lab 11/29/17 1638 11/29/17 1643 11/29/17 2055 11/30/17 0350  NA 139 142 139 139  K 3.4* 3.4* 3.3* 4.6  CL 106 106 105 109  CO2 18*  --  20* 22  BUN 14 15 14 12   CREATININE 1.69* 1.40* 1.49* 1.33*  GLUCOSE 181* 181* 156* 148*    Electrolytes Recent Labs  Lab 11/29/17 1638 11/29/17 2055 11/30/17 0350  CALCIUM 9.0 8.0* 8.1*  MG  --   --  2.4  PHOS  --   --  2.6    CBC Recent Labs  Lab 11/29/17 1638 11/29/17 1643 11/30/17 0610  WBC 10.8*  --  16.8*  HGB 14.3 14.3 13.8  HCT 42.5 42.0 39.7  PLT 241  --  240     Coag's Recent Labs  Lab 11/29/17 1638  INR 1.11    Sepsis Markers Recent Labs  Lab 11/29/17 2232 11/30/17 0350  PROCALCITON 1.74 1.94    ABG Recent Labs  Lab 11/29/17 1835 11/29/17 2050 11/30/17 0422  PHART 7.269* 7.352 7.431  PCO2ART 38.7 34.9 33.0  PO2ART 70.0* 117.0* 159*    Liver Enzymes Recent Labs  Lab 11/29/17 1638  AST 78*  ALT 37  ALKPHOS 45  BILITOT 1.0  ALBUMIN 4.1    Cardiac Enzymes No results for input(s): TROPONINI, PROBNP in the last 168 hours.  Glucose Recent Labs  Lab 11/29/17 2108 11/29/17 2322 11/30/17 0343 11/30/17 0722  GLUCAP 131* 112* 135* 126*    Imaging Ct Head Wo Contrast  Result Date: 11/29/2017 CLINICAL DATA:  Found down.  Status post intubation. EXAM: CT HEAD WITHOUT CONTRAST CT CERVICAL SPINE WITHOUT CONTRAST TECHNIQUE: Multidetector CT imaging of the head and cervical spine was performed following the standard protocol without intravenous contrast. Multiplanar CT image reconstructions of the cervical spine were also generated. COMPARISON:  02/23/2016 FINDINGS: CT HEAD FINDINGS Brain: There is no evidence for acute hemorrhage, hydrocephalus, mass lesion, or abnormal extra-axial fluid collection. No definite CT evidence for acute  infarction. Diffuse loss of parenchymal volume is consistent with atrophy. Patchy low attenuation in the deep hemispheric and periventricular white matter is nonspecific, but likely reflects chronic microvascular ischemic demyelination. Lacunar infarct noted left cerebellar hemisphere. Age-indeterminate lacunar infarcts identified in the basal ganglia bilaterally. Vascular: No hyperdense vessel or unexpected calcification. Skull: No evidence for fracture. No worrisome lytic or sclerotic lesion. Sinuses/Orbits: The visualized paranasal sinuses and mastoid air cells are clear. Visualized portions of the globes and intraorbital fat are unremarkable. Other: NG tube and endotracheal tube visualized  incompletely. CT CERVICAL SPINE FINDINGS Alignment: Straightening of normal cervical lordosis. Skull base and vertebrae: No evidence for an acute fracture. No subluxation. Soft tissues and spinal canal: No prevertebral fluid or swelling. No visible canal hematoma. Disc levels: Loss of intervertebral disc height is noted at C4-5, C5-6, and C6-7. Scattered areas of mild facet osteoarthritis are noted bilaterally. Upper chest: Dense consolidation is identified in the right lung apex with patchy airspace opacity identified in the left apex. Other: None. IMPRESSION: 1. No acute intracranial abnormality. Atrophy with chronic small vessel white matter ischemic demyelination. 2. No evidence for cervical spine fracture. 3. Loss of cervical lordosis. This can be related to patient positioning, muscle spasm or soft tissue injury. 4. Dense airspace consolidation right lung apex with patchy airspace disease in the left lung apex. Patient is having chest CT performed at the same time which will better characterize lung findings. Electronically Signed   By: Kennith CenterEric  Mansell M.D.   On: 11/29/2017 17:44   Ct Chest Wo Contrast  Result Date: 11/29/2017 CLINICAL DATA:  Patient was found down for 5-6 minutes. Intubated and shocked. EXAM: CT CHEST WITHOUT CONTRAST TECHNIQUE: Multidetector CT imaging of the chest was performed following the standard protocol without IV contrast. COMPARISON:  Same day CXR FINDINGS: Cardiovascular: Heart is borderline enlarged without pericardial effusion. Left main, LAD, left circumflex and RCA coronary arteriosclerosis. There is mild aortic atherosclerosis without aneurysm. Mediastinum/Nodes: A gastric tube is noted with tip and side-port coiled in the stomach. Go no axillary, supraclavicular nor mediastinal lymphadenopathy. Assessment of the hilar for lymphadenopathy is limited due to lack of IV contrast. The thyroid gland is not enlarged and there appear to be a few small subcentimeter hypodense  nodules on the left, the largest approximately 5 mm. Lungs/Pleura: The tip of an endotracheal tube is seen at the level of the aortic arch, above the carina in satisfactory position. Patchy alveolar consolidations are noted in both lower lobes, lingula, and posterior left upper lobe possibly from aspiration. Confluent atelectasis in the right upper lobe is identified without air bronchograms which is new since the earlier same day CXR and findings likely are related to endobronchial obstruction possibly from mucous plugging. Upper Abdomen: No acute abnormality. Musculoskeletal: Acute and chronic bilateral rib fractures are noted, the acute fractures involving the left anterior second, third, lateral fourth through sixth ribs and on the right involving the third through sixth ribs. IMPRESSION: 1. New right upper lobe atelectasis accounting for confluent pulmonary consolidation without air bronchograms since the earlier same day CXR. Findings are likely related to mucous plugging given the acuity of this finding. Endotracheal tube is in satisfactory position. 2. Patchy multilobar airspace consolidations in both lower lobes, lingula and left upper lobe compatible with aspiration. 3. Bilateral acute and chronic rib fractures without associated pneumothorax. 4. Left main and three-vessel coronary arteriosclerosis and mild aortic atherosclerosis. Aortic Atherosclerosis (ICD10-I70.0). Electronically Signed   By: Tollie Ethavid  Kwon M.D.   On: 11/29/2017  17:55   Ct Cervical Spine Wo Contrast  Result Date: 11/29/2017 CLINICAL DATA:  Found down.  Status post intubation. EXAM: CT HEAD WITHOUT CONTRAST CT CERVICAL SPINE WITHOUT CONTRAST TECHNIQUE: Multidetector CT imaging of the head and cervical spine was performed following the standard protocol without intravenous contrast. Multiplanar CT image reconstructions of the cervical spine were also generated. COMPARISON:  02/23/2016 FINDINGS: CT HEAD FINDINGS Brain: There is no  evidence for acute hemorrhage, hydrocephalus, mass lesion, or abnormal extra-axial fluid collection. No definite CT evidence for acute infarction. Diffuse loss of parenchymal volume is consistent with atrophy. Patchy low attenuation in the deep hemispheric and periventricular white matter is nonspecific, but likely reflects chronic microvascular ischemic demyelination. Lacunar infarct noted left cerebellar hemisphere. Age-indeterminate lacunar infarcts identified in the basal ganglia bilaterally. Vascular: No hyperdense vessel or unexpected calcification. Skull: No evidence for fracture. No worrisome lytic or sclerotic lesion. Sinuses/Orbits: The visualized paranasal sinuses and mastoid air cells are clear. Visualized portions of the globes and intraorbital fat are unremarkable. Other: NG tube and endotracheal tube visualized incompletely. CT CERVICAL SPINE FINDINGS Alignment: Straightening of normal cervical lordosis. Skull base and vertebrae: No evidence for an acute fracture. No subluxation. Soft tissues and spinal canal: No prevertebral fluid or swelling. No visible canal hematoma. Disc levels: Loss of intervertebral disc height is noted at C4-5, C5-6, and C6-7. Scattered areas of mild facet osteoarthritis are noted bilaterally. Upper chest: Dense consolidation is identified in the right lung apex with patchy airspace opacity identified in the left apex. Other: None. IMPRESSION: 1. No acute intracranial abnormality. Atrophy with chronic small vessel white matter ischemic demyelination. 2. No evidence for cervical spine fracture. 3. Loss of cervical lordosis. This can be related to patient positioning, muscle spasm or soft tissue injury. 4. Dense airspace consolidation right lung apex with patchy airspace disease in the left lung apex. Patient is having chest CT performed at the same time which will better characterize lung findings. Electronically Signed   By: Kennith CenterEric  Mansell M.D.   On: 11/29/2017 17:44   Dg  Chest Port 1 View  Result Date: 11/30/2017 CLINICAL DATA:  Check endotracheal tube placement EXAM: PORTABLE CHEST 1 VIEW COMPARISON:  11/29/2017 FINDINGS: Endotracheal tube and nasogastric catheter are again noted and stable. Swan-Ganz catheter is noted in satisfactory position. Intra-aortic balloon pump is noted and stable. Some patchy changes remain in the right upper lobe. Interval clearing in the left mid lung is noted. Old rib fractures are seen. IMPRESSION: Improved aeration bilaterally with some persistent changes in the right upper lobe. Tubes and lines as described. Electronically Signed   By: Alcide CleverMark  Lukens M.D.   On: 11/30/2017 08:34   Dg Chest Port 1 View  Result Date: 11/29/2017 CLINICAL DATA:  Aortic balloon assist device. EXAM: PORTABLE CHEST 1 VIEW COMPARISON:  Chest radiograph November 29 2017 at 1630 hours FINDINGS: Intra-aortic balloon pump tip immediately below the aortic knob. Swan-Ganz catheter via RIGHT internal jugular venous approach with distal tip projecting in main pulmonary artery. Nasogastric tube tip and side-port project in proximal stomach. Endotracheal tube tip projects 4.8 cm above the carina. Multiple EKG lines overlie the patient and may obscure subtle underlying pathology. Cardiac silhouette is upper limits of normal in size. Mediastinal silhouette is nonsuspicious, mildly calcified aortic knob. Faint midlung zone airspace opacity, faint RIGHT upper lobe airspace opacity. Small suspected LEFT layering pleural effusion. Bilateral old rib fractures. IMPRESSION: Well-positioned life-support lines. Bilateral airspace opacities seen with atelectasis, confluent edema or pneumonia. Small suspected  layering LEFT pleural effusion. Borderline cardiomegaly. Electronically Signed   By: Awilda Metro M.D.   On: 11/29/2017 21:06   Dg Chest Portable 1 View  Result Date: 11/29/2017 CLINICAL DATA:  Status post intubation.  Status post CPR. EXAM: PORTABLE CHEST 1 VIEW COMPARISON:   Radiographs of February 23, 2016. FINDINGS: The heart size and mediastinal contours are within normal limits. Both lungs are clear. Endotracheal and nasogastric tubes are in grossly good position. No pneumothorax or pleural effusion is noted. Old left rib fractures are noted. IMPRESSION: Endotracheal and nasogastric tubes in grossly good position. No acute cardiopulmonary abnormality seen. Electronically Signed   By: Lupita Raider, M.D.   On: 11/29/2017 17:07     STUDIES:  Head CT negative  CULTURES: Blood 12/6>>> Urine 12/6>>> Sputum 12/6>>>  ANTIBIOTICS: Unasyn 12/6>>> 12/6 Zosyn 12/6 >  Vanc 12/6 > 12/7  SIGNIFICANT EVENTS: 12/6 cardiac arrest  LINES/TUBES: ETT 12/6>>> OGT 12/6>>> Foley 12/6>>> R IJ swan >>>   DISCUSSION: 56 y/o male with three vessel CAD s/p Vfib arrest.  He aspirated, has respiratory failure. Has been making purposeful movements but not following commands.  ASSESSMENT / PLAN:  PULMONARY A: Acute respiratory failure with hypoxemia > better Aspiration pneumonia RUL  P:   Full mechanical vent support VAP prevention Daily WUA/SBT Decrease PEEP to 5 today Consider extubation over weekend if shock status and mental status improved  CARDIOVASCULAR A:  Cardiogenic shock Vfib arrest CAD  P:  Tele Continue levophed Continue heparin Care per Cardiology/TCTS, likely CABG next week  RENAL A:   AKI> better P:   Monitor BMET and UOP Replace electrolytes as needed  GASTROINTESTINAL A:   No acute issues P:   Start tube feedings Pantoprazole for stress ulcer prophylaxis  HEMATOLOGIC A:   Anemia without bleeding P:  Monitor for bleeding Continue heparin infusion  INFECTIOUS A:   Aspiration pneumonia P:   Stop vanc Follow culture Continue zosyn  ENDOCRINE A:   No acute issues P: No acute issues   NEUROLOGIC A:   A Acute encephalopathy> making purposeful movements but not following commands P:   RASS goal: -1 Continue  fentanyl and versed Stop propofol   FAMILY  - Updates: none bedside  - Inter-disciplinary family meet or Palliative Care meeting due by:  day 7   My cc time 35 minutes  Heber Gal[place], MD [facility] Pager: [phone] Cell: [phone] After 3pm or if no response, call [phone]   11/30/2017, 8:53 AM

## 2017-11-30 NOTE — Progress Notes (Signed)
ANTICOAGULATION CONSULT NOTE - Follow Up Consult  Pharmacy Consult for Heparin Indication: IABP  No Known Allergies  Patient Measurements: Height: 5\' 9"  (175.3 cm) Weight: 156 lb 8.4 oz (71 kg) IBW/kg (Calculated) : 70.7   Vital Signs: Temp: 100.2 F (37.9 C) (12/07 1700) Temp Source: Core (12/07 1200) BP: 139/66 (12/07 1645) Pulse Rate: 77 (12/07 1700)  Labs: Recent Labs    11/29/17 1638 11/29/17 1643 11/29/17 2055 11/30/17 0350 11/30/17 0610 11/30/17 1530  HGB 14.3 14.3  --   --  13.8  --   HCT 42.5 42.0  --   --  39.7  --   PLT 241  --   --   --  240  --   LABPROT 14.2  --   --   --   --   --   INR 1.11  --   --   --   --   --   HEPARINUNFRC  --   --   --  0.56  --  0.27*  CREATININE 1.69* 1.40* 1.49* 1.33*  --   --     Estimated Creatinine Clearance: 63.5 mL/min (A) (by C-G formula based on SCr of 1.33 mg/dL (H)).   Medications:  Heparin @ 850 units/hr  Assessment: 56 year old male with witnessed cardiac arrest and immediate CPR and shock by AED. Patient taken immediately to cath and found to have severe multivessel CAD with need for IABP placement. Heparin was started.   Heparin level is therapeutic for low goal at 0.27 on 850 units/hr.  CBC stable. No bleeding noted. SCr trending down (1.33), but low UOP - NS bolus given.  Goal of Therapy:  Heparin level 0.2-0.5 units/ml Monitor platelets by anticoagulation protocol: Yes   Plan:  1) Continue heparin to 850 units/hr 2) Check heparin level in 6 hours to confirm.  Link SnufferJessica Dariya Gainer, PharmD, BCPS, BCCCP Clinical Pharmacist Clinical phone 11/30/2017 until 11PM [phone]- [phone] After hours, please call #[phone] 11/30/2017,5:14 PM

## 2017-11-30 NOTE — Progress Notes (Signed)
Minimal urine output observed over last several hours. Foley catheter assessed for kinks and loops. No evidence of IABP movement.  CVP 4-5. Arita MissAndy T, HF PA, updated. Orders for 500cc NS bolus given.

## 2017-12-01 ENCOUNTER — Inpatient Hospital Stay (HOSPITAL_COMMUNITY): Payer: Medicaid Other

## 2017-12-01 LAB — HEPARIN LEVEL (UNFRACTIONATED)
HEPARIN UNFRACTIONATED: 0.22 [IU]/mL — AB (ref 0.30–0.70)
Heparin Unfractionated: 0.14 IU/mL — ABNORMAL LOW (ref 0.30–0.70)
Heparin Unfractionated: 0.18 IU/mL — ABNORMAL LOW (ref 0.30–0.70)

## 2017-12-01 LAB — COMPREHENSIVE METABOLIC PANEL
ALT: 30 U/L (ref 17–63)
AST: 81 U/L — ABNORMAL HIGH (ref 15–41)
Albumin: 2.9 g/dL — ABNORMAL LOW (ref 3.5–5.0)
Alkaline Phosphatase: 35 U/L — ABNORMAL LOW (ref 38–126)
Anion gap: 7 (ref 5–15)
BUN: 6 mg/dL (ref 6–20)
CO2: 21 mmol/L — ABNORMAL LOW (ref 22–32)
Calcium: 7.7 mg/dL — ABNORMAL LOW (ref 8.9–10.3)
Chloride: 108 mmol/L (ref 101–111)
Creatinine, Ser: 1.02 mg/dL (ref 0.61–1.24)
GFR calc Af Amer: >60 mL/min (ref >60)
GFR calc non Af Amer: >60 mL/min (ref >60)
Glucose, Bld: 118 mg/dL — ABNORMAL HIGH (ref 65–99)
Potassium: 3.5 mmol/L (ref 3.5–5.1)
Sodium: 136 mmol/L (ref 135–145)
Total Bilirubin: 0.9 mg/dL (ref 0.3–1.2)
Total Protein: 5.1 g/dL — ABNORMAL LOW (ref 6.5–8.1)

## 2017-12-01 LAB — CBC
HEMATOCRIT: 36.3 % — AB (ref 39.0–52.0)
HEMOGLOBIN: 12.2 g/dL — AB (ref 13.0–17.0)
MCH: 31.6 pg (ref 26.0–34.0)
MCHC: 33.6 g/dL (ref 30.0–36.0)
MCV: 94 fL (ref 78.0–100.0)
Platelets: 138 10*3/uL — ABNORMAL LOW (ref 150–400)
RBC: 3.86 MIL/uL — ABNORMAL LOW (ref 4.22–5.81)
RDW: 13 % (ref 11.5–15.5)
WBC: 9.8 10*3/uL (ref 4.0–10.5)

## 2017-12-01 LAB — GLUCOSE, CAPILLARY
GLUCOSE-CAPILLARY: 112 mg/dL — AB (ref 65–99)
GLUCOSE-CAPILLARY: 108 mg/dL — AB (ref 65–99)
GLUCOSE-CAPILLARY: 119 mg/dL — AB (ref 65–99)
Glucose-Capillary: 109 mg/dL — ABNORMAL HIGH (ref 65–99)
Glucose-Capillary: 108 mg/dL — ABNORMAL HIGH (ref 65–99)

## 2017-12-01 LAB — COOXEMETRY PANEL
CARBOXYHEMOGLOBIN: 0.7 % (ref 0.5–1.5)
Methemoglobin: 1.3 % (ref 0.0–1.5)
O2 SAT: 65.6 %
Total hemoglobin: 12.4 g/dL (ref 12.0–16.0)

## 2017-12-01 LAB — MAGNESIUM
MAGNESIUM: 1.9 mg/dL (ref 1.7–2.4)
Magnesium: 2 mg/dL (ref 1.7–2.4)

## 2017-12-01 LAB — PROTIME-INR
INR: 1.22
Prothrombin Time: 15.3 seconds — ABNORMAL HIGH (ref 11.4–15.2)

## 2017-12-01 LAB — PHOSPHORUS
PHOSPHORUS: 2.7 mg/dL (ref 2.5–4.6)
Phosphorus: 1.7 mg/dL — ABNORMAL LOW (ref 2.5–4.6)

## 2017-12-01 LAB — URINE CULTURE
Culture: NO GROWTH
Special Requests: NORMAL

## 2017-12-01 LAB — PROCALCITONIN: PROCALCITONIN: 1.06 ng/mL

## 2017-12-01 MED ORDER — THIAMINE HCL 100 MG/ML IJ SOLN
Freq: Once | INTRAVENOUS | Status: AC
  Administered 2017-12-01: 10:00:00 via INTRAVENOUS
  Filled 2017-12-01: qty 1000

## 2017-12-01 MED ORDER — VITAMIN B-1 100 MG PO TABS
100.0000 mg | ORAL_TABLET | Freq: Every day | ORAL | Status: DC
  Administered 2017-12-02 – 2017-12-13 (×11): 100 mg via ORAL
  Filled 2017-12-01 (×11): qty 1

## 2017-12-01 MED ORDER — FOLIC ACID 1 MG PO TABS
1.0000 mg | ORAL_TABLET | Freq: Every day | ORAL | Status: DC
  Administered 2017-12-02 – 2017-12-13 (×11): 1 mg via ORAL
  Filled 2017-12-01 (×11): qty 1

## 2017-12-01 MED ORDER — POTASSIUM CHLORIDE 20 MEQ/15ML (10%) PO SOLN
20.0000 meq | Freq: Once | ORAL | Status: AC
  Administered 2017-12-01: 20 meq via ORAL
  Filled 2017-12-01: qty 15

## 2017-12-01 MED ORDER — POTASSIUM CHLORIDE IN NACL 20-0.9 MEQ/L-% IV SOLN
INTRAVENOUS | Status: DC
  Filled 2017-12-01: qty 1000

## 2017-12-01 MED ORDER — DEXMEDETOMIDINE HCL IN NACL 400 MCG/100ML IV SOLN
0.4000 ug/kg/h | INTRAVENOUS | Status: DC
  Administered 2017-12-01 (×3): 1 ug/kg/h via INTRAVENOUS
  Administered 2017-12-02: 0.1 ug/kg/h via INTRAVENOUS
  Administered 2017-12-03 – 2017-12-05 (×4): 0.4 ug/kg/h via INTRAVENOUS
  Filled 2017-12-01 (×8): qty 100

## 2017-12-01 MED ORDER — POTASSIUM PHOSPHATES 15 MMOLE/5ML IV SOLN
30.0000 mmol | Freq: Once | INTRAVENOUS | Status: AC
  Administered 2017-12-01: 30 mmol via INTRAVENOUS
  Filled 2017-12-01: qty 10

## 2017-12-01 NOTE — Progress Notes (Signed)
ANTICOAGULATION CONSULT NOTE Pharmacy Consult for Heparin Indication: IABP  No Known Allergies  Patient Measurements: Height: 5\' 9"  (175.3 cm) Weight: 160 lb 15 oz (73 kg) IBW/kg (Calculated) : 70.7   Vital Signs: Temp: 98.4 F (36.9 C) (12/08 0530) BP: 111/48 (12/08 0530) Pulse Rate: 69 (12/08 0530)  Labs: Recent Labs    11/29/17 1638 11/29/17 1643 11/29/17 2055  11/30/17 0350 11/30/17 0610 11/30/17 1530 11/30/17 2211 12/01/17 0457 12/01/17 0458  HGB 14.3 14.3  --   --   --  13.8  --   --  12.2*  --   HCT 42.5 42.0  --   --   --  39.7  --   --  36.3*  --   PLT 241  --   --   --   --  240  --   --  138*  --   LABPROT 14.2  --   --   --   --   --   --   --  15.3*  --   INR 1.11  --   --   --   --   --   --   --  1.22  --   HEPARINUNFRC  --   --   --    < > 0.56  --  0.27* 0.22*  --  0.14*  CREATININE 1.69* 1.40* 1.49*  --  1.33*  --  1.22  --   --   --    < > = values in this interval not displayed.    Estimated Creatinine Clearance: 69.2 mL/min (by C-G formula based on SCr of 1.22 mg/dL).  Assessment: 56 y.o. male sp cardiac arrest/cath/IABP placement, awaiting CABG, for heparin  Goal of Therapy:  Heparin level 0.2-0.5 units/ml Monitor platelets by anticoagulation protocol: Yes   Plan:  Increase Heparin 1000 units/hr Check heparin level in 8 hours.   Geannie RisenGreg Jejuan Scala, PharmD, BCPS

## 2017-12-01 NOTE — Progress Notes (Signed)
eLink Physician-Brief Progress Note Patient Name: Jose [name] DOB: 07/24/1961 MRN: [id number]   Date of Service  12/01/2017  HPI/Events of Note  Hypophos and moderately low potassium   eICU Interventions  Phos and potassium replaced     Intervention Category Intermediate Interventions: Electrolyte abnormality - evaluation and management  Wilbern Pennypacker 12/01/2017, 6:11 AM

## 2017-12-01 NOTE — Progress Notes (Signed)
Advanced Heart Failure Progress   Primary Cardiologist:  New to [place] (Dr. Saunders Revel) Primary HF: New (Dr. Haroldine Laws)  Reason for Consultation: VT/VF arrest  HPI:    Awake on vent trying to get out of bed. Following commands. But got agitated and had to be resedated  On 40% FiO2  Echo reviewed personally. EF ~30% RV normal. Co-ox 66%  On IABP 1:1.  Norepi 6. Milrinone 0.25. PCWP remains low. Got 1L fluids yesterday. Uo remians low.   Swan RA  4 PA 24/9 PCWP 7 Thermo 4.3/2.0   LHC 11/29/17 Left Main  Dist LM lesion 40% stenosed  Left Anterior Descending  Prox LAD-1 lesion is 60% stenosed. The lesion is calcified.  Prox LAD-2 lesion 80% stenosed  First Diagonal Branch  Vessel is small in size.  Ost 1st Diag to 1st Diag lesion 70% stenosed  Second Diagonal Branch  Vessel is small in size. There is mild disease in the vessel.  Left Circumflex  Ost Cx lesion 30% stenosed  Prox Cx lesion is 80% stenosed. The lesion is calcified.  Prox Cx to Mid Cx lesion 80% stenosed  First Obtuse Marginal Branch  Vessel is small in size.  Ost 1st Mrg lesion 90% stenosed  Second Obtuse Marginal Branch  Ost 2nd Mrg to 2nd Mrg lesion 60% stenosed  Right Coronary Artery  Collaterals  Mid RCA filled by collaterals from Prox RCA.    Prox RCA to Mid RCA lesion is 100% stenosed. The lesion is chronically occluded with bridging and left-to-right collateral flow.  Right Posterior Descending Artery  Collaterals  RPDA filled by collaterals from 2nd Sept.     Objective:    Vital Signs:   Temp:  [98.2 F (36.8 C)-100.4 F (38 C)] 98.6 F (37 C) (12/08 0700) Pulse Rate:  [66-96] 71 (12/08 0700) Resp:  [17-21] 18 (12/08 0700) BP: (91-141)/(42-88) 101/50 (12/08 0700) SpO2:  [94 %-100 %] 100 % (12/08 0700) Arterial Line BP: (82-145)/(39-64) 96/57 (12/08 0700) FiO2 (%):  [40 %] 40 % (12/08 0400) Weight:  [73 kg (160 lb 15 oz)] 73 kg (160 lb 15 oz) (12/08 0500) Last BM Date: (PTA)  Weight  change: Filed Weights   11/29/17 1804 11/30/17 0500 12/01/17 0500  Weight: 87 kg (191 lb 12.8 oz) 71 kg (156 lb 8.4 oz) 73 kg (160 lb 15 oz)    Intake/Output:   Intake/Output Summary (Last 24 hours) at 12/01/2017 0846 Last data filed at 12/01/2017 0700 Gross per 24 hour  Intake 3986.26 ml  Output 590 ml  Net 3396.26 ml      Physical Exam    General:  Intubated/sedated HEENT: normal x for ETT Neck: RIJ swan supple. no JVD. Carotids 2+ bilat; no bruits. No lymphadenopathy or thryomegaly appreciated. Cor: PMI nondisplaced. Regular rate & rhythm. No rubs, gallops or murmurs. Lungs: clear Abdomen: soft, nontender, nondistended. No hepatosplenomegaly. No bruits or masses. Good bowel sounds. Extremities: no cyanosis, clubbing, rash, edema  RFA IABP Neuro: sedated will awaken and follow command   Telemetry   NSR 70-80s, Personally reviewed.   EKG    11/29/17 Sinus tach 135  Labs   Basic Metabolic Panel: Recent Labs  Lab 11/29/17 1638 11/29/17 1643 11/29/17 2055 11/30/17 0350 11/30/17 1121 11/30/17 1530 12/01/17 0457  NA 139 142 139 139  --  137 136  K 3.4* 3.4* 3.3* 4.6  --  3.0* 3.5  CL 106 106 105 109  --  109 108  CO2 18*  --  20* 22  --  20* 21*  GLUCOSE 181* 181* 156* 148*  --  117* 118*  BUN '14 15 14 12  ' --  9 6  CREATININE 1.69* 1.40* 1.49* 1.33*  --  1.22 1.02  CALCIUM 9.0  --  8.0* 8.1*  --  7.3* 7.7*  MG  --   --   --  2.4 2.2 2.0 2.0  PHOS  --   --   --  2.6 1.8* 2.0* 1.7*    Liver Function Tests: Recent Labs  Lab 11/29/17 1638 12/01/17 0457  AST 78* 81*  ALT 37 30  ALKPHOS 45 35*  BILITOT 1.0 0.9  PROT 6.6 5.1*  ALBUMIN 4.1 2.9*   No results for input(s): LIPASE, AMYLASE in the last 168 hours. No results for input(s): AMMONIA in the last 168 hours.  CBC: Recent Labs  Lab 11/29/17 1638 11/29/17 1643 11/30/17 0610 12/01/17 0457  WBC 10.8*  --  16.8* 9.8  NEUTROABS 6.5  --   --   --   HGB 14.3 14.3 13.8 12.2*  HCT 42.5 42.0 39.7 36.3*   MCV 97.0  --  95.4 94.0  PLT 241  --  240 138*    Cardiac Enzymes: No results for input(s): CKTOTAL, CKMB, CKMBINDEX, TROPONINI in the last 168 hours.  BNP: BNP (last 3 results) No results for input(s): BNP in the last 8760 hours.  ProBNP (last 3 results) No results for input(s): PROBNP in the last 8760 hours.   CBG: Recent Labs  Lab 11/30/17 1534 11/30/17 1939 11/30/17 2329 12/01/17 0404 12/01/17 0803  GLUCAP 107* 128* 117* 109* 112*    Coagulation Studies: Recent Labs    11/29/17 1638 12/01/17 0457  LABPROT 14.2 15.3*  INR 1.11 1.22    Imaging   No results found.   Medications:     Current Medications: . aspirin  81 mg Per Tube Daily  . atorvastatin  80 mg Per Tube q1800  . chlorhexidine gluconate (MEDLINE KIT)  15 mL Mouth Rinse BID  . Chlorhexidine Gluconate Cloth  6 each Topical Daily  . digoxin  0.125 mg Oral Daily  . fentaNYL (SUBLIMAZE) injection  50 mcg Intravenous Once  . insulin aspart  2-6 Units Subcutaneous Q4H  . mouth rinse  15 mL Mouth Rinse QID  . pantoprazole sodium  40 mg Per Tube QHS  . sodium chloride flush  3 mL Intravenous Q12H  . spironolactone  12.5 mg Oral Daily    Infusions: . sodium chloride 10 mL/hr at 12/01/17 0700  . sodium chloride Stopped (11/29/17 2300)  . feeding supplement (VITAL AF 1.2 CAL) 1,000 mL (12/01/17 0700)  . fentaNYL infusion INTRAVENOUS 100 mcg/hr (12/01/17 0700)  . heparin 1,000 Units/hr (12/01/17 0700)  . midazolam (VERSED) infusion 1 mg/hr (12/01/17 0700)  . milrinone 0.375 mcg/kg/min (12/01/17 0700)  . norepinephrine (LEVOPHED) Adult infusion 5 mcg/min (12/01/17 0700)  . piperacillin-tazobactam (ZOSYN)  IV 3.375 g (12/01/17 0523)  . potassium PHOSPHATE IVPB (mmol) 30 mmol (12/01/17 0947)    Patient Profile   [name] is a 56 y.o. male with unknown medical history who presented [facility]29[facility]17 after VT/VF arrest. Intubated emergently in ER. Chest CT with RUL collapse. Pt underwent  bronch and taken emergently to cath lab.  Cath showed Severe 3v CAD and elevated LVEDP. IABP placed and started on milrinone with low output CHF.   Assessment/Plan   1. VT/VF Arrest -> Cardiogenic shock - EF 25-30% by echo due to  iCM - Hemodynamics remain marginal despite IABP and NE and milrinone - Volume status low. Will give 1L fluid over 5 hours - Dr. Prescott Gum has seen. Probable CABG later this week once mental status and respiratory issues improved  2. CAD - Severe 3vD as above - As above will need CABG once/if recovers. Dr. Prescott Gum on board.  - On ASA/statin  3. Acute systolic CHF due to ICM - EF 25-30% - Avoid BB with low output.  - Continue spiro 12.5 mg daily as tolerated - Continue digoxin 0.125 mg daily.  - No room for additional neurohormonal blockade at this time.  - See above  4. Acute respiratory failure with RUL collapse by CT.  - Intubated. CCM following.  -CXR clear this am. On 40% hopefully can extubate later today  5. Severe ETOH abuse - Family reports he drinks heavily - On versed now. Will need CIWA once extubated.  - Will give banana bag  6. Hypokalemia - supp  CRITICAL CARE Performed by: Glori Bickers  Total critical care time: 35 minutes  Critical care time was exclusive of separately billable procedures and treating other patients.  Critical care was necessary to treat or prevent imminent or life-threatening deterioration.  Critical care was time spent personally by me (independent of midlevel providers or residents) on the following activities: development of treatment plan with patient and/or surrogate as well as nursing, discussions with consultants, evaluation of patient's response to treatment, examination of patient, obtaining history from patient or surrogate, ordering and performing treatments and interventions, ordering and review of laboratory studies, ordering and review of radiographic studies, pulse oximetry and re-evaluation  of patient's condition.    Length of Stay: 2  Glori Bickers, MD  12/01/2017, 8:46 AM  Advanced Heart Failure Team Pager [phone] (M-F; 7a - 4p)  Please contact [facility] for night-coverage after hours (4p -7a ) and weekends on [email or web address]

## 2017-12-01 NOTE — Progress Notes (Signed)
Pt placed back on full vent support due to increased agitation.   

## 2017-12-01 NOTE — Progress Notes (Signed)
ANTICOAGULATION CONSULT NOTE Pharmacy Consult for Heparin Indication: IABP  No Known Allergies  Patient Measurements: Height: 5\' 9"  (175.3 cm) Weight: 160 lb 15 oz (73 kg) IBW/kg (Calculated) : 70.7   Vital Signs: Temp: 99.1 F (37.3 C) (12/08 2100) BP: 123/61 (12/08 2100) Pulse Rate: 75 (12/08 2100)  Labs: Recent Labs    11/29/17 1638 11/29/17 1643  11/30/17 0350 11/30/17 0610 11/30/17 1530  12/01/17 0457 12/01/17 0458 12/01/17 1400 12/01/17 2100  HGB 14.3 14.3  --   --  13.8  --   --  12.2*  --   --   --   HCT 42.5 42.0  --   --  39.7  --   --  36.3*  --   --   --   PLT 241  --   --   --  240  --   --  138*  --   --   --   LABPROT 14.2  --   --   --   --   --   --  15.3*  --   --   --   INR 1.11  --   --   --   --   --   --  1.22  --   --   --   HEPARINUNFRC  --   --    < > 0.56  --  0.27*   < >  --  0.14* 0.18* 0.22*  CREATININE 1.69* 1.40*   < > 1.33*  --  1.22  --  1.02  --   --   --    < > = values in this interval not displayed.    Estimated Creatinine Clearance: 82.8 mL/min (by C-G formula based on SCr of 1.02 mg/dL).  Assessment: 56 y.o. male sp cardiac arrest/cath/IABP placement, awaiting CABG, for heparin.   Heparin level came back within goal range at 0.22, on 1100 units/hr. Hgb down slightly to 13.8 to 12.2, plts down from 240 to 138. No issues with infusion per nursing. No signs/symptoms of bleeding.   Goal of Therapy:  Heparin level 0.2-0.5 units/ml Monitor platelets by anticoagulation protocol: Yes   Plan:  Continue heparin infusion at 1100 units/hr Obtain 6 hour heparin level Monitor daily HL and CBC as needed  Girard CooterKimberly Perkins, PharmD Clinical Pharmacist  Pager: [phone] Phone: [phone] 12/01/2017 9:38 PM

## 2017-12-01 NOTE — Progress Notes (Signed)
PULMONARY / CRITICAL CARE MEDICINE   Name: Jose [name] MRN: [id number] DOB: 08/20/1961    ADMISSION DATE:  11/29/2017 CONSULTATION DATE:  11/29/2017  REFERRING MD:  Page EDP  CHIEF COMPLAINT:  Cardiac arrest  HISTORY OF PRESENT ILLNESS:   56 y/o male admitted on 12/6 with a Vfib arrest.  Aspirated.   SUBJECTIVE:  No acute events Making urine Less vasopressor requirement  VITAL SIGNS: BP (!) 99/38   Pulse 79   Temp 99.7 F (37.6 C)   Resp 18   Ht 5\' 9"  (1.753 m)   Wt 160 lb 15 oz (73 kg)   SpO2 100%   BMI 23.77 kg/m   HEMODYNAMICS: PAP: (20-37)/(5-21) 24/7 CVP:  [1 mmHg-10 mmHg] 4 mmHg PCWP:  [7 mmHg-8 mmHg] 7 mmHg CO:  [4.3 L/min-5.1 L/min] 4.3 L/min CI:  [2.1 L/min/m2-2.5 L/min/m2] 2.1 L/min/m2  VENTILATOR SETTINGS: Vent Mode: PRVC FiO2 (%):  [40 %] 40 % Set Rate:  [16 bmp-18 bmp] 18 bmp Vt Set:  [620 mL] 620 mL PEEP:  [5 cmH20] 5 cmH20 Plateau Pressure:  [16 cmH20-19 cmH20] 16 cmH20  INTAKE / OUTPUT: I/O last 3 completed shifts: In: 5183.1 [I.V.:2466.8; NG/GT:1266.3; IV Piggyback:1450] Out: 3080 [Urine:2980; Emesis/NG output:100]  PHYSICAL EXAMINATION:  General: Well-nourished well-developed male currently heavily sedated not sedated agitated HEENT: The tracheal tube connected to ventilator, internal jugular PA catheter in place site unremarkable PSY: Agitated or sedated Neuro: Follows some commands CV: Sounds are regular regular rate and rhythm with sounds of balloon pump noted PULM: Increased breath sounds in the base QI:ONGEGI:soft, non-tender, bsx4 active  Extremities: warm/dry, mild edema Skin: no rashes or lesions  LABS:  BMET Recent Labs  Lab 11/30/17 0350 11/30/17 1530 12/01/17 0457  NA 139 137 136  K 4.6 3.0* 3.5  CL 109 109 108  CO2 22 20* 21*  BUN 12 9 6   CREATININE 1.33* 1.22 1.02  GLUCOSE 148* 117* 118*   Electrolytes Recent Labs  Lab 11/30/17 0350 11/30/17 1121 11/30/17 1530 12/01/17 0457  CALCIUM 8.1*  --  7.3* 7.7*   MG 2.4 2.2 2.0 2.0  PHOS 2.6 1.8* 2.0* 1.7*   CBC Recent Labs  Lab 11/29/17 1638 11/29/17 1643 11/30/17 0610 12/01/17 0457  WBC 10.8*  --  16.8* 9.8  HGB 14.3 14.3 13.8 12.2*  HCT 42.5 42.0 39.7 36.3*  PLT 241  --  240 138*   Coag's Recent Labs  Lab 11/29/17 1638 12/01/17 0457  INR 1.11 1.22    Sepsis Markers Recent Labs  Lab 11/29/17 2232 11/30/17 0350 12/01/17 0457  PROCALCITON 1.74 1.94 1.06    ABG Recent Labs  Lab 11/29/17 1835 11/29/17 2050 11/30/17 0422  PHART 7.269* 7.352 7.431  PCO2ART 38.7 34.9 33.0  PO2ART 70.0* 117.0* 159*    Liver Enzymes Recent Labs  Lab 11/29/17 1638 12/01/17 0457  AST 78* 81*  ALT 37 30  ALKPHOS 45 35*  BILITOT 1.0 0.9  ALBUMIN 4.1 2.9*    Cardiac Enzymes No results for input(s): TROPONINI, PROBNP in the last 168 hours.  Glucose Recent Labs  Lab 11/30/17 1132 11/30/17 1534 11/30/17 1939 11/30/17 2329 12/01/17 0404 12/01/17 0803  GLUCAP 130* 107* 128* 117* 109* 112*    Imaging Dg Chest Port 1 View  Result Date: 12/01/2017 CLINICAL DATA:  "Acute or chronic respiratory failure with hypercapnia"Right heart cath, left heart cath and coronary angiography, IABP insertion all on 11/29/2017 EXAM: PORTABLE CHEST 1 VIEW COMPARISON:  11/30/2017 FINDINGS: Cardiac silhouette is normal in  size. No mediastinal or hilar masses. Clear lungs.  No convincing pleural effusion.  No pneumothorax. Endotracheal tube, right internal jugular Swan-Ganz catheter, nasal/orogastric tube and intra-aortic balloon pump are stable from the previous exam. IMPRESSION: 1. Improved lung aeration. Lungs are now clear. No new abnormalities. 2. Support apparatus is stable from the previous day's exam. Electronically Signed   By: Amie Portlandavid  Ormond M.D.   On: 12/01/2017 09:12     STUDIES:  Head CT negative  CULTURES: Blood 12/6>>> Urine 12/6>>> Sputum 12/6>>>  ANTIBIOTICS: Unasyn 12/6>>> 12/6 Zosyn 12/6 >  Vanc 12/6 > 12/7  SIGNIFICANT  EVENTS: 12/6 cardiac arrest  LINES/TUBES: ETT 12/6>>> OGT 12/6>>> Foley 12/6>>> R IJ swan >>>   DISCUSSION: 56 y/o male with three vessel CAD s/p Vfib arrest.  He aspirated, has respiratory failure. Has been making purposeful movements but not following commands. 12/01/2017 follows commands but is agitated.  ASSESSMENT / PLAN:  PULMONARY A: Acute respiratory failure with hypoxemia > better Aspiration pneumonia RUL  P:   Full mechanical vent support VAP prevention Daily WUA/SBT 12/01/2017 continue to assess for extubation.  Agitation is an issue very difficult to stop sedation provided with him and agitated and this impedes him from going on 5/5 with pressure support CPAP  CARDIOVASCULAR A:  Cardiogenic shock Vfib arrest CAD  P:  Tele Continue levophed Continue heparin Care per Cardiology/TCTS, likely CABG next week  RENAL Lab Results  Component Value Date   CREATININE 1.02 12/01/2017   CREATININE 1.22 11/30/2017   CREATININE 1.33 (H) 11/30/2017   Recent Labs  Lab 11/30/17 0350 11/30/17 1530 12/01/17 0457  K 4.6 3.0* 3.5    A:   AKI> better P:   Monitor BMET and UOP Replace electrolytes as needed  GASTROINTESTINAL A:   No acute issues P:   Start tube feedings Pantoprazole for stress ulcer prophylaxis  HEMATOLOGIC Recent Labs    11/30/17 0610 12/01/17 0457  HGB 13.8 12.2*   Lab Results  Component Value Date   INR 1.22 12/01/2017   INR 1.11 11/29/2017    A:   Anemia without bleeding P:  Monitor for bleeding Continue heparin infusion  INFECTIOUS A:   Aspiration pneumonia P:   Stop vanc 11/30/2017 Follow culture data positive culture data 12/01/2017 Chest x-ray improved 12/02/2007 Continue zosyn  ENDOCRINE CBG (last 3)  Recent Labs    11/30/17 2329 12/01/17 0404 12/01/17 0803  GLUCAP 117* 109* 112*    A:   No acute issues P: No acute issues   NEUROLOGIC A:   A Acute encephalopathy> making purposeful movements but  not following commands P:   RASS goal: -1 Continue fentanyl and versed Stop propofol 11/30/2017 12/01/2017 arouses becomes agitated easily, but follows commands.  We will try to lower the sedation as tolerated.   FAMILY  - Updates: none bedside  - Inter-disciplinary family meet or Palliative Care meeting due by:  day 7   My cc time 35 minutes  Brett CanalesSteve Minor ACNP [facility] PCCM Pager [phone] till 1 pm If no answer page [phone] 12/01/2017, 2510:3003 AM  56 year old male [facility]oh [facility], aspiration and pneumonia.  Patient was purposeful so was not cooled.  Today on exam, he is following commands but agitated and beginning to see signs of etoh withdrawal.  I reviewed CXR myself, ETT in good position.  Discussed with PCCM-NP.  Continue full vent support for now until IABP is out.  Start precedex for withdrawal.  Adjust vent for ABG.  Will likely need a CABG but will defer to cards.  PCCM will continue to follow.  The patient is critically ill with multiple organ systems failure and requires high complexity decision making for assessment and support, frequent evaluation and titration of therapies, application of advanced monitoring technologies and extensive interpretation of multiple databases.   Critical Care Time devoted to patient care services described in this note is  35  Minutes. This time reflects time of care of this signee Dr Koren Bound. This critical care time does not reflect procedure time, or teaching time or supervisory time of PA/NP/Med student/Med Resident etc but could involve care discussion time.  Alyson Reedy, M.D. [facility] Pulmonary/Critical Care Medicine. Pager: [phone]. After hours pager: [phone].

## 2017-12-01 NOTE — Progress Notes (Signed)
ANTICOAGULATION CONSULT NOTE Pharmacy Consult for Heparin Indication: IABP  No Known Allergies  Patient Measurements: Height: 5\' 9"  (175.3 cm) Weight: 160 lb 15 oz (73 kg) IBW/kg (Calculated) : 70.7   Vital Signs: Temp: 100.4 F (38 C) (12/08 1500) BP: 135/77 (12/08 1500) Pulse Rate: 76 (12/08 1500)  Labs: Recent Labs    11/29/17 1638 11/29/17 1643  11/30/17 0350 11/30/17 0610 11/30/17 1530 11/30/17 2211 12/01/17 0457 12/01/17 0458 12/01/17 1400  HGB 14.3 14.3  --   --  13.8  --   --  12.2*  --   --   HCT 42.5 42.0  --   --  39.7  --   --  36.3*  --   --   PLT 241  --   --   --  240  --   --  138*  --   --   LABPROT 14.2  --   --   --   --   --   --  15.3*  --   --   INR 1.11  --   --   --   --   --   --  1.22  --   --   HEPARINUNFRC  --   --    < > 0.56  --  0.27* 0.22*  --  0.14* 0.18*  CREATININE 1.69* 1.40*   < > 1.33*  --  1.22  --  1.02  --   --    < > = values in this interval not displayed.    Estimated Creatinine Clearance: 82.8 mL/min (by C-G formula based on SCr of 1.02 mg/dL).  Assessment: 56 y.o. male sp cardiac arrest/cath/IABP placement, awaiting CABG, for heparin  Hep lvl slightly low at 0.18 on 1000 units/hr  Goal of Therapy:  Heparin level 0.2-0.5 units/ml Monitor platelets by anticoagulation protocol: Yes   Plan:  Increase Heparin 1100 units/hr Next lvl 2100  Isaac BlissMichael Quorra Rosene, PharmD, BCPS, BCCCP Clinical Pharmacist Clinical phone for 12/01/2017 from 7a-3:30p: [phone] If after 3:30p, please call main pharmacy at: [phone] 12/01/2017 3:49 PM

## 2017-12-01 NOTE — Plan of Care (Signed)
Pt progressing

## 2017-12-02 ENCOUNTER — Inpatient Hospital Stay (HOSPITAL_COMMUNITY): Payer: Medicaid Other

## 2017-12-02 DIAGNOSIS — F10231 Alcohol dependence with withdrawal delirium: Secondary | ICD-10-CM

## 2017-12-02 DIAGNOSIS — I10 Essential (primary) hypertension: Secondary | ICD-10-CM

## 2017-12-02 DIAGNOSIS — I2510 Atherosclerotic heart disease of native coronary artery without angina pectoris: Secondary | ICD-10-CM

## 2017-12-02 LAB — BASIC METABOLIC PANEL
ANION GAP: 7 (ref 5–15)
BUN: 5 mg/dL — ABNORMAL LOW (ref 6–20)
CALCIUM: 8 mg/dL — AB (ref 8.9–10.3)
CO2: 20 mmol/L — AB (ref 22–32)
CREATININE: 0.93 mg/dL (ref 0.61–1.24)
Chloride: 112 mmol/L — ABNORMAL HIGH (ref 101–111)
Glucose, Bld: 134 mg/dL — ABNORMAL HIGH (ref 65–99)
Potassium: 3.5 mmol/L (ref 3.5–5.1)
Sodium: 139 mmol/L (ref 135–145)

## 2017-12-02 LAB — COOXEMETRY PANEL
CARBOXYHEMOGLOBIN: 1.2 % (ref 0.5–1.5)
Methemoglobin: 0.9 % (ref 0.0–1.5)
O2 SAT: 67.2 %
Total hemoglobin: 14.1 g/dL (ref 12.0–16.0)

## 2017-12-02 LAB — GLUCOSE, CAPILLARY
GLUCOSE-CAPILLARY: 101 mg/dL — AB (ref 65–99)
GLUCOSE-CAPILLARY: 103 mg/dL — AB (ref 65–99)
GLUCOSE-CAPILLARY: 130 mg/dL — AB (ref 65–99)
GLUCOSE-CAPILLARY: 63 mg/dL — AB (ref 65–99)
GLUCOSE-CAPILLARY: 89 mg/dL (ref 65–99)
Glucose-Capillary: 137 mg/dL — ABNORMAL HIGH (ref 65–99)
Glucose-Capillary: 105 mg/dL — ABNORMAL HIGH (ref 65–99)
Glucose-Capillary: 120 mg/dL — ABNORMAL HIGH (ref 65–99)

## 2017-12-02 LAB — CULTURE, BAL-QUANTITATIVE W GRAM STAIN: Special Requests: NORMAL

## 2017-12-02 LAB — POCT I-STAT 3, ART BLOOD GAS (G3+)
Acid-base deficit: 6 mmol/L — ABNORMAL HIGH (ref 0.0–2.0)
BICARBONATE: 18.3 mmol/L — AB (ref 20.0–28.0)
O2 SAT: 99 %
PCO2 ART: 30.5 mmHg — AB (ref 32.0–48.0)
PH ART: 7.385 (ref 7.350–7.450)
Patient temperature: 36.7
TCO2: 19 mmol/L — AB (ref 22–32)
pO2, Arterial: 121 mmHg — ABNORMAL HIGH (ref 83.0–108.0)

## 2017-12-02 LAB — PHOSPHORUS: PHOSPHORUS: 2.5 mg/dL (ref 2.5–4.6)

## 2017-12-02 LAB — CBC
HEMATOCRIT: 34.7 % — AB (ref 39.0–52.0)
HEMOGLOBIN: 11.5 g/dL — AB (ref 13.0–17.0)
MCH: 31.7 pg (ref 26.0–34.0)
MCHC: 33.1 g/dL (ref 30.0–36.0)
MCV: 95.6 fL (ref 78.0–100.0)
Platelets: ADEQUATE 10*3/uL (ref 150–400)
RBC: 3.63 MIL/uL — ABNORMAL LOW (ref 4.22–5.81)
RDW: 12.9 % (ref 11.5–15.5)
WBC: 6.1 10*3/uL (ref 4.0–10.5)

## 2017-12-02 LAB — HEPARIN LEVEL (UNFRACTIONATED)
HEPARIN UNFRACTIONATED: 0.28 [IU]/mL — AB (ref 0.30–0.70)
Heparin Unfractionated: 0.2 IU/mL — ABNORMAL LOW (ref 0.30–0.70)

## 2017-12-02 LAB — CULTURE, BAL-QUANTITATIVE: CULTURE: NORMAL — AB

## 2017-12-02 LAB — MAGNESIUM: Magnesium: 1.8 mg/dL (ref 1.7–2.4)

## 2017-12-02 MED ORDER — HEPARIN (PORCINE) IN NACL 100-0.45 UNIT/ML-% IJ SOLN
1200.0000 [IU]/h | INTRAMUSCULAR | Status: DC
  Administered 2017-12-02 – 2017-12-03 (×2): 1200 [IU]/h via INTRAVENOUS
  Filled 2017-12-02: qty 250

## 2017-12-02 MED ORDER — ATORVASTATIN CALCIUM 80 MG PO TABS
80.0000 mg | ORAL_TABLET | Freq: Every day | ORAL | Status: DC
  Administered 2017-12-03 – 2017-12-12 (×9): 80 mg via ORAL
  Filled 2017-12-02 (×3): qty 1
  Filled 2017-12-02: qty 2
  Filled 2017-12-02 (×5): qty 1

## 2017-12-02 MED ORDER — POTASSIUM CHLORIDE 20 MEQ/15ML (10%) PO SOLN
40.0000 meq | Freq: Once | ORAL | Status: AC
  Administered 2017-12-02: 40 meq via ORAL
  Filled 2017-12-02: qty 30

## 2017-12-02 MED ORDER — CHLORHEXIDINE GLUCONATE 0.12 % MT SOLN
OROMUCOSAL | Status: AC
  Administered 2017-12-02: 15 mL via OROMUCOSAL
  Filled 2017-12-02: qty 15

## 2017-12-02 MED ORDER — ORAL CARE MOUTH RINSE
15.0000 mL | Freq: Two times a day (BID) | OROMUCOSAL | Status: DC
  Administered 2017-12-03: 15 mL via OROMUCOSAL

## 2017-12-02 MED ORDER — DEXTROSE 50 % IV SOLN
INTRAVENOUS | Status: AC
Start: 2017-12-02 — End: 2017-12-02
  Filled 2017-12-02: qty 50

## 2017-12-02 MED ORDER — PANTOPRAZOLE SODIUM 40 MG PO TBEC
40.0000 mg | DELAYED_RELEASE_TABLET | Freq: Every day | ORAL | Status: DC
  Administered 2017-12-03 – 2017-12-06 (×4): 40 mg via ORAL
  Filled 2017-12-02 (×4): qty 1

## 2017-12-02 MED ORDER — DEXTROSE 50 % IV SOLN
1.0000 | Freq: Once | INTRAVENOUS | Status: AC
  Administered 2017-12-02: 50 mL via INTRAVENOUS

## 2017-12-02 MED ORDER — POTASSIUM CHLORIDE 10 MEQ/50ML IV SOLN
10.0000 meq | INTRAVENOUS | Status: AC
Start: 2017-12-02 — End: 2017-12-02
  Administered 2017-12-02 (×2): 10 meq via INTRAVENOUS
  Filled 2017-12-02 (×2): qty 50

## 2017-12-02 MED ORDER — ASPIRIN 81 MG PO CHEW
81.0000 mg | CHEWABLE_TABLET | Freq: Every day | ORAL | Status: DC
  Administered 2017-12-03 – 2017-12-06 (×4): 81 mg via ORAL
  Filled 2017-12-02 (×4): qty 1

## 2017-12-02 NOTE — Progress Notes (Addendum)
RT called to pt's room due to self extubation at 09:11.  Pt is breathing on his own and was placed on [place] 4 L with humidity immediately.  No stridor noted.  NP came to pt's room and  requested Heated High Flow [place].

## 2017-12-02 NOTE — Progress Notes (Signed)
ANTICOAGULATION CONSULT NOTE Pharmacy Consult for Heparin Indication: IABP  No Known Allergies  Patient Measurements: Height: 5\' 9"  (175.3 cm) Weight: 163 lb 5.8 oz (74.1 kg) IBW/kg (Calculated) : 70.7   Vital Signs: Temp: 100.4 F (38 C) (12/09 1200) Temp Source: Core (12/09 1200) BP: 128/52 (12/09 1200) Pulse Rate: 106 (12/09 1200)  Labs: Recent Labs    11/29/17 1638  11/30/17 0610 11/30/17 1530  12/01/17 0457  12/01/17 2100 12/02/17 0333 12/02/17 0400 12/02/17 1330  HGB 14.3   < > 13.8  --   --  12.2*  --   --  11.5*  --   --   HCT 42.5   < > 39.7  --   --  36.3*  --   --  34.7*  --   --   PLT 241  --  240  --   --  138*  --   --  PLATELET CLUMPS NOTED ON SMEAR, COUNT APPEARS ADEQUATE  --   --   LABPROT 14.2  --   --   --   --  15.3*  --   --   --   --   --   INR 1.11  --   --   --   --  1.22  --   --   --   --   --   HEPARINUNFRC  --    < >  --  0.27*   < >  --    < > 0.22*  --  0.20* 0.28*  CREATININE 1.69*   < >  --  1.22  --  1.02  --   --  0.93  --   --    < > = values in this interval not displayed.    Estimated Creatinine Clearance: 90.8 mL/min (by C-G formula based on SCr of 0.93 mg/dL).  . sodium chloride 10 mL/hr at 12/02/17 0800  . sodium chloride Stopped (11/29/17 2300)  . dexmedetomidine (PRECEDEX) IV infusion Stopped (12/02/17 1100)  . feeding supplement (VITAL AF 1.2 CAL) Stopped (12/02/17 1000)  . heparin 1,200 Units/hr (12/02/17 0800)  . milrinone 0.375 mcg/kg/min (12/02/17 0800)  . norepinephrine (LEVOPHED) Adult infusion Stopped (12/02/17 1205)  . piperacillin-tazobactam (ZOSYN)  IV 3.375 g (12/02/17 1304)     Assessment: 56 y.o. male sp cardiac arrest/cath/IABP placement, awaiting CABG, for heparin.  Heparin level was low this AM, and gtt turned up to 1200 units/hr.  Heparin level currently within goal range for IABP. No signs/symptoms of bleeding.   Goal of Therapy:  Heparin level 0.2-0.5 units/ml Monitor platelets by anticoagulation  protocol: Yes   Plan:  Continue heparin infusion at 1200 units/hr Obtain 6 hour heparin level Monitor daily HL and CBC as needed  Tad MooreJessica Junius Faucett, Pharm D, BCPS  Clinical Pharmacist Pager [phone]  12/02/2017 2:58 PM

## 2017-12-02 NOTE — Progress Notes (Signed)
IV Fentanyl 150 CCs wasted in sink with Erin Curry,RN d/t drip being discontinued.

## 2017-12-02 NOTE — Progress Notes (Signed)
PULMONARY / CRITICAL CARE MEDICINE   Name: [name] MRN: [id number] DOB: October 29, 1961    ADMISSION DATE:  11/29/2017 CONSULTATION DATE:  11/29/2017  REFERRING MD:  Page EDP  CHIEF COMPLAINT:  Cardiac arrest  HISTORY OF PRESENT ILLNESS:   56 y/o male admitted on 12/6 with a Vfib arrest.  Aspirated.   SUBJECTIVE:  Self extubated this a.m.  VITAL SIGNS: BP 131/67   Pulse 86   Temp 97.7 F (36.5 C)   Resp 18   Ht  (1.753 m)   Wt 163 lb 5.8 oz (74.1 kg)   SpO2 98%   BMI 24.12 kg/m   HEMODYNAMICS: PAP: (23-45)/(7-23) 30/12 CVP:  [1 mmHg-14 mmHg] 5 mmHg  VENTILATOR SETTINGS: Vent Mode: PRVC FiO2 (%):  [40 %] 40 % Set Rate:  [18 bmp] 18 bmp Vt Set:  [620 mL] 620 mL PEEP:  [5 cmH20] 5 cmH20 Pressure Support:  [5 cmH20] 5 cmH20 Plateau Pressure:  [16 cmH20-17 cmH20] 16 cmH20  INTAKE / OUTPUT: I/O last 3 completed shifts: In: 5273 [I.V.:2263; NG/GT:2810; IV Piggyback:200] Out: 2920 [Urine:2890; Emesis/NG output:30]  PHYSICAL EXAMINATION: General: Well-nourished 56 year old white male currently resting comfortably in bed intermittently desaturates but responds appropriately when asked to take deep breath HEENT: Nasogastric tube in place.  Normocephalic atraumatic mucous membranes are moist. Pulmonary: Decreased bases, no accessory muscle use, appears to be breathing comfortably. Cardiac: Regular rate and rhythm Abdomen: Nontender positive bowel sounds no organomegaly GU: Clear yellow urine Neuro/psych: Speech is raspy, moves all extremities, oriented x3 but remains impulsive at times Extremities/musculoskeletal: Warm, dry, no significant edema brisk cap refill positive pulses.  LABS:  BMET Recent Labs  Lab 11/30/17 1530 12/01/17 0457 12/02/17 0333  NA 137 136 139  K 3.0* 3.5 3.5  CL 109 108 112*  CO2 20* 21* 20*  BUN 9 6 5*  CREATININE 1.22 1.02 0.93  GLUCOSE 117* 118* 134*   Electrolytes Recent Labs  Lab 11/30/17 1530 12/01/17 0457  12/01/17 1805 12/02/17 0333  CALCIUM 7.3* 7.7*  --  8.0*  MG 2.0 2.0 1.9 1.8  PHOS 2.0* 1.7* 2.7 2.5   CBC Recent Labs  Lab 11/30/17 0610 12/01/17 0457 12/02/17 0333  WBC 16.8* 9.8 6.1  HGB 13.8 12.2* 11.5*  HCT 39.7 36.3* 34.7*  PLT 240 138* PLATELET CLUMPS NOTED ON SMEAR, COUNT APPEARS ADEQUATE   Coag's Recent Labs  Lab 11/29/17 1638 12/01/17 0457  INR 1.11 1.22    Sepsis Markers Recent Labs  Lab 11/29/17 2232 11/30/17 0350 12/01/17 0457  PROCALCITON 1.74 1.94 1.06    ABG Recent Labs  Lab 11/29/17 2050 11/30/17 0422 12/02/17 0345  PHART 7.352 7.431 7.385  PCO2ART 34.9 33.0 30.5*  PO2ART 117.0* 159* 121.0*    Liver Enzymes Recent Labs  Lab 11/29/17 1638 12/01/17 0457  AST 78* 81*  ALT 37 30  ALKPHOS 45 35*  BILITOT 1.0 0.9  ALBUMIN 4.1 2.9*    Cardiac Enzymes No results for input(s): TROPONINI, PROBNP in the last 168 hours.  Glucose Recent Labs  Lab 12/01/17 1108 12/01/17 1542 12/01/17 1933 12/02/17 0003 12/02/17 0333 12/02/17 0830  GLUCAP 108* 108* 119* 137* 130* 63*    Imaging Dg Chest Port 1 View  Result Date: 12/02/2017 CLINICAL DATA:  Followup ventilator support EXAM: PORTABLE CHEST 1 VIEW COMPARISON:  12/01/2017 FINDINGS: Endotracheal tube tip is 4 cm above the carina. Nasogastric tube enters the abdomen. Swan-Ganz catheter tips in the right main pulmonary artery. IABP grossly unchanged. Lung apices  are excluded from the film. There is worsening of atelectasis in both lower lobes. IMPRESSION: Lines and tubes well positioned. Worsening of atelectasis in the lower lobes. Electronically Signed   By: Paulina FusiMark  Shogry M.D.   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  He aspirated, has respiratory failure.  Self extubated today.  We will minimize sedation, continue supportive care, hopefully we can keep him extubated  ASSESSMENT / PLAN:   V. fib arrest followed by cardiogenic shock CAD Plan Continue telemetry monitoring Continuing heparin drip Hemodynamics Per heart failure/cardiology/thoracic surgery team He remains on intra-aortic balloon pump Hoping for CABG later in the week  Acute respiratory failure with hypoxemia > better Aspiration pneumonia RUL -Chest x-ray personally reviewed this demonstrated lines and tubes in satisfactory position.  He does have bibasilar atelectasis; upper lobe infiltrate had improved -All cultures negative to date -Self extubated 12/9 Plan High flow oxygen Continuous pulse oximetry Incentive spirometry Minimize sedation N.p.o. as he is high risk for reintubation  day #4 of 7 Zosyn; vancomycin stopped 12/7   AKI with intermittent fluid and electrolyte imbalance Serum creatinine has normalized has mild hyperchloremia and borderline hypokalemia Plan Replace potassium KVO IV fluids Repeat a.m. chemistry including magnesium and phosphorus  Anemia of critical illness Hemoglobin stable without evidence of active bleeding Plan Serial CBCs Transfuse per ICU protocol  Protein calorie malnutrition in setting of critical illness Plan Holding tube feeds given self extubation Aspiration precautions Resume tube feeds later today if pulmonary status remains stable  Acute encephalopathy> he is oriented x3 but still impulsive  Discontinuing fentanyl and Versed Continue Precedex RASS goal 0 Delirium interventions including lights on during the day lights off at night winds up during the day and frequent reorientation Continue thiamine and folate    FAMILY  - Updates: none bedside  - Inter-disciplinary family meet or Palliative Care  meeting due by:  day 7  My critical care time 35 minutes  Simonne MartinetPeter E Babcock ACNP-BC [facility] Pul[facility]Critical [facility] Pager # [phone] OR # [phone] if no answer  Attending Note:  56 year old alcoholic male who suffered a VF arrest and was resuscitated.  He was not cooled as he became responsive post intubation.  Patient woke up but started suffering withdrawal and precedex was started.  Patient self extubated this AM but remains on precedex.  On exam, mild confusion but clear lungs.  I reviewed CXR myself, infiltrate noted.  Discussed with PCCM-NP.  Continue precedex for withdrawal.  CIWA protocol.  Cards to address timing for cath/pacer.  Continue zosyn for aspiration pneumonia.  Titrate O2 for sat of 88-92%.  PCCM will follow.  The patient is critically ill with multiple organ systems failure and requires high complexity decision making for assessment and support, frequent evaluation and titration of therapies, application of advanced monitoring technologies and extensive interpretation of multiple databases.   Critical Care Time devoted to patient care services described in this note is  35  Minutes. This time reflects time of care of this signee Dr Koren BoundWesam Aviance Cooper[place]. This critical care time does not reflect procedure time, or teaching time or supervisory time of PA/NP/Med student/Med Resident  etc but could involve care discussion time.  Rush Farmer, M.D. [facility] Pulmonary/Critical Care Medicine. Pager: [phone]. After hours pager: [phone].

## 2017-12-02 NOTE — Progress Notes (Signed)
[facility] FOR AM LAB REPLACEMENT ONLY  The patient does apply for the [facility] Electrolyte Re[facility]ment Protocol based on the criteria listed below:   1. Is GFR >/= 40 ml/min? Yes.    Patient's GFR today is >40 2. Is urine output >/= 0.5 ml/kg/hr for the last 6 hours? Yes.   Patient's UOP is 1.2 ml/kg/hr 3. Is BUN < 60 mg/dL? Yes.    Patient's BUN today is 5 4. Abnormal electrolyte(s): k 3.5  5. Ordered repletion with: protocol 6. If a panic level lab has been reported, has the CCM MD in charge been notified? No..   Physician:    Markus DaftWHELAN, Jwan Hornbaker A 12/02/2017 6:18 AM

## 2017-12-02 NOTE — Progress Notes (Signed)
Advanced Heart Failure Progress   Primary Cardiologist:  New to [facility] (Dr. Saunders Revel) Primary HF: New (Dr. Haroldine Laws)   HPI:    Self-extubated this am. Tearful but ok. Denies CP  On IABP 1:1.  Norepi 2. Milrinone 0.375.   Swan RA  6 PA 36/14 PCWP 6 Thermo 6.5/3.2  Echo reviewed personally. EF ~30% RV normal.   LHC 11/29/17 Left Main  Dist LM lesion 40% stenosed  Left Anterior Descending  Prox LAD-1 lesion is 60% stenosed. The lesion is calcified.  Prox LAD-2 lesion 80% stenosed  First Diagonal Branch  Vessel is small in size.  Ost 1st Diag to 1st Diag lesion 70% stenosed  Second Diagonal Branch  Vessel is small in size. There is mild disease in the vessel.  Left Circumflex  Ost Cx lesion 30% stenosed  Prox Cx lesion is 80% stenosed. The lesion is calcified.  Prox Cx to Mid Cx lesion 80% stenosed  First Obtuse Marginal Branch  Vessel is small in size.  Ost 1st Mrg lesion 90% stenosed  Second Obtuse Marginal Branch  Ost 2nd Mrg to 2nd Mrg lesion 60% stenosed  Right Coronary Artery  Collaterals  Mid RCA filled by collaterals from Prox RCA.    Prox RCA to Mid RCA lesion is 100% stenosed. The lesion is chronically occluded with bridging and left-to-right collateral flow.  Right Posterior Descending Artery  Collaterals  RPDA filled by collaterals from 2nd Sept.     Objective:    Vital Signs:   Temp:  [97.5 F (36.4 C)-101.7 F (38.7 C)] 99 F (37.2 C) (12/09 0930) Pulse Rate:  [67-125] 88 (12/09 0930) Resp:  [17-23] 22 (12/09 0935) BP: (88-135)/(45-85) 110/51 (12/09 0935) SpO2:  [93 %-100 %] 93 % (12/09 0935) Arterial Line BP: (78-153)/(36-77) 118/56 (12/09 0930) FiO2 (%):  [40 %-50 %] 50 % (12/09 0935) Weight:  [74.1 kg (163 lb 5.8 oz)] 74.1 kg (163 lb 5.8 oz) (12/09 0400) Last BM Date: (PTA)  Weight change: Filed Weights   11/30/17 0500 12/01/17 0500 12/02/17 0400  Weight: 71 kg (156 lb 8.4 oz) 73 kg (160 lb 15 oz) 74.1 kg (163 lb 5.8 oz)     Intake/Output:   Intake/Output Summary (Last 24 hours) at 12/02/2017 1040 Last data filed at 12/02/2017 0920 Gross per 24 hour  Intake 3563.2 ml  Output 2610 ml  Net 953.2 ml      Physical Exam    General:  Extubated Tearful. Raspy voice HEENT: normal x for NGT Neck: supple. no JVD. Carotids 2+ bilat; no bruits. No lymphadenopathy or thryomegaly appreciated. Cor: PMI nondisplaced. Regular rate & rhythm. No rubs, gallops or murmurs. Lungs: clear Abdomen: soft, nontender, nondistended. No hepatosplenomegaly. No bruits or masses. Good bowel sounds. Extremities: no cyanosis, clubbing, rash, edema RFA IABP R DP 2+ Neuro: alert & orientedx3, cranial nerves grossly intact. moves all 4 extremities w/o difficulty. Affect tearful   Telemetry   NSR 80s Personally reviewed   EKG    11/29/17 Sinus tach 135  Labs   Basic Metabolic Panel: Recent Labs  Lab 11/29/17 2055  11/30/17 0350 11/30/17 1121 11/30/17 1530 12/01/17 0457 12/01/17 1805 12/02/17 0333  NA 139  --  139  --  137 136  --  139  K 3.3*  --  4.6  --  3.0* 3.5  --  3.5  CL 105  --  109  --  109 108  --  112*  CO2 20*  --  22  --  20* 21*  --  20*  GLUCOSE 156*  --  148*  --  117* 118*  --  134*  BUN 14  --  12  --  9 6  --  5*  CREATININE 1.49*  --  1.33*  --  1.22 1.02  --  0.93  CALCIUM 8.0*  --  8.1*  --  7.3* 7.7*  --  8.0*  MG  --    < > 2.4 2.2 2.0 2.0 1.9 1.8  PHOS  --    < > 2.6 1.8* 2.0* 1.7* 2.7 2.5   < > = values in this interval not displayed.    Liver Function Tests: Recent Labs  Lab 11/29/17 1638 12/01/17 0457  AST 78* 81*  ALT 37 30  ALKPHOS 45 35*  BILITOT 1.0 0.9  PROT 6.6 5.1*  ALBUMIN 4.1 2.9*   No results for input(s): LIPASE, AMYLASE in the last 168 hours. No results for input(s): AMMONIA in the last 168 hours.  CBC: Recent Labs  Lab 11/29/17 1638 11/29/17 1643 11/30/17 0610 12/01/17 0457 12/02/17 0333  WBC 10.8*  --  16.8* 9.8 6.1  NEUTROABS 6.5  --   --   --   --    HGB 14.3 14.3 13.8 12.2* 11.5*  HCT 42.5 42.0 39.7 36.3* 34.7*  MCV 97.0  --  95.4 94.0 95.6  PLT 241  --  240 138* PLATELET CLUMPS NOTED ON SMEAR, COUNT APPEARS ADEQUATE    Cardiac Enzymes: No results for input(s): CKTOTAL, CKMB, CKMBINDEX, TROPONINI in the last 168 hours.  BNP: BNP (last 3 results) No results for input(s): BNP in the last 8760 hours.  ProBNP (last 3 results) No results for input(s): PROBNP in the last 8760 hours.   CBG: Recent Labs  Lab 12/01/17 1933 12/02/17 0003 12/02/17 0333 12/02/17 0830 12/02/17 0928  GLUCAP 119* 137* 130* 63* 105*    Coagulation Studies: Recent Labs    11/29/17 1638 12/01/17 0457  LABPROT 14.2 15.3*  INR 1.11 1.22    Imaging   Dg Chest Port 1 View  Result Date: 12/02/2017 CLINICAL DATA:  Followup ventilator support EXAM: PORTABLE CHEST 1 VIEW COMPARISON:  12/01/2017 FINDINGS: Endotracheal tube tip is 4 cm above the carina. Nasogastric tube enters the abdomen. Swan-Ganz catheter tips in the right main pulmonary artery. IABP grossly unchanged. Lung apices are excluded from the film. There is worsening of atelectasis in both lower lobes. IMPRESSION: Lines and tubes well positioned. Worsening of atelectasis in the lower lobes. Electronically Signed   By: Nelson Chimes M.D.   On: 12/02/2017 06:58     Medications:     Current Medications: . aspirin  81 mg Per Tube Daily  . atorvastatin  80 mg Per Tube q1800  . chlorhexidine gluconate (MEDLINE KIT)  15 mL Mouth Rinse BID  . Chlorhexidine Gluconate Cloth  6 each Topical Daily  . dextrose      . digoxin  0.125 mg Oral Daily  . folic acid  1 mg Oral Daily  . insulin aspart  2-6 Units Subcutaneous Q4H  . mouth rinse  15 mL Mouth Rinse QID  . pantoprazole sodium  40 mg Per Tube QHS  . potassium chloride  40 mEq Oral Once  . sodium chloride flush  3 mL Intravenous Q12H  . spironolactone  12.5 mg Oral Daily  . thiamine  100 mg Oral Daily    Infusions: . sodium chloride 10  mL/hr at 12/02/17 0800  . sodium  chloride Stopped (11/29/17 2300)  . dexmedetomidine (PRECEDEX) IV infusion 1 mcg/kg/hr (12/02/17 0800)  . feeding supplement (VITAL AF 1.2 CAL) 1,000 mL (12/02/17 0800)  . heparin 1,200 Units/hr (12/02/17 0800)  . milrinone 0.375 mcg/kg/min (12/02/17 0800)  . norepinephrine (LEVOPHED) Adult infusion 3 mcg/min (12/02/17 0800)  . piperacillin-tazobactam (ZOSYN)  IV 3.375 g (12/02/17 0500)    Patient Profile   [name] is a 56 y.o. male with unknown medical history who presented to [facility] 11/29/17 after VT/VF arrest. Intubated emergently in ER. Chest CT with RUL collapse. Pt underwent bronch and taken emergently to cath lab.  Cath showed Severe 3v CAD and elevated LVEDP. IABP placed and started on milrinone with low output CHF.   Assessment/Plan   1. VT/VF Arrest -> Cardiogenic shock - EF 25-30% by echo due to iCM - Hemodynamics much improved over last 24 hours with IABP and NE and milrinone. Will continue  - Volume status low but stable. Can give more IVFs as needed but given extubation would prefer to keep lungs dry. - Dr. Prescott Gum has seen. Probable CABG early this week - Would keep IABP in until CABG with critical CAD  2. CAD - Severe 3vD as above - Continue IABP. CABG this week - On ASA/statin and heaprin  3. 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  - No room for additional neurohormonal blockade at this time.  - See above  4. Acute respiratory failure with RUL collapse by CT.  - Self extubated this am. CCM following.  - CXR with LL atx. Otherwise ok. Personally reviewed   5. Severe ETOH abuse - Family reports he drinks heavily - Continue CIWA  6. Hypokalemia - supp  CRITICAL CARE Performed by: Glori Bickers  Total critical care time: 35 minutes  Critical care time was exclusive of  separately billable procedures and treating other patients.  Critical care was necessary to treat or prevent imminent or life-threatening deterioration.  Critical care was time spent personally by me (independent of midlevel providers or residents) on the following activities: development of treatment plan with patient and/or surrogate as well as nursing, discussions with consultants, evaluation of patient's response to treatment, examination of patient, obtaining history from patient or surrogate, ordering and performing treatments and interventions, ordering and review of laboratory studies, ordering and review of radiographic studies, pulse oximetry and re-evaluation of patient's condition.    Length of Stay: 3  Glori Bickers, MD  12/02/2017, 10:40 AM  Advanced Heart Failure Team Pager [phone] (M-F; 7a - 4p)  Please contact [facility] for night-coverage after hours (4p -7a ) and weekends on [email or web address]

## 2017-12-03 ENCOUNTER — Encounter (HOSPITAL_COMMUNITY)
Admission: EM | Disposition: A | Discharge status: Home Health | Payer: Self-pay | Source: Home / Self Care | Attending: Internal Medicine

## 2017-12-03 ENCOUNTER — Inpatient Hospital Stay (HOSPITAL_COMMUNITY): Payer: Medicaid Other

## 2017-12-03 ENCOUNTER — Other Ambulatory Visit (HOSPITAL_COMMUNITY): Payer: Self-pay

## 2017-12-03 DIAGNOSIS — J9600 Acute respiratory failure, unspecified whether with hypoxia or hypercapnia: Secondary | ICD-10-CM

## 2017-12-03 HISTORY — PX: IABP INSERTION: CATH118242

## 2017-12-03 LAB — BASIC METABOLIC PANEL
Anion gap: 8 (ref 5–15)
BUN: 5 mg/dL — ABNORMAL LOW (ref 6–20)
CALCIUM: 8.5 mg/dL — AB (ref 8.9–10.3)
CHLORIDE: 106 mmol/L (ref 101–111)
CO2: 23 mmol/L (ref 22–32)
Creatinine, Ser: 0.86 mg/dL (ref 0.61–1.24)
GFR calc Af Amer: >60 mL/min (ref >60)
GLUCOSE: 105 mg/dL — AB (ref 65–99)
POTASSIUM: 4.2 mmol/L (ref 3.5–5.1)
Sodium: 137 mmol/L (ref 135–145)

## 2017-12-03 LAB — GLUCOSE, CAPILLARY
GLUCOSE-CAPILLARY: 103 mg/dL — AB (ref 65–99)
GLUCOSE-CAPILLARY: 105 mg/dL — AB (ref 65–99)
GLUCOSE-CAPILLARY: 106 mg/dL — AB (ref 65–99)
GLUCOSE-CAPILLARY: 78 mg/dL (ref 65–99)
Glucose-Capillary: 98 mg/dL (ref 65–99)

## 2017-12-03 LAB — CBC
HCT: 34 % — ABNORMAL LOW (ref 39.0–52.0)
Hemoglobin: 11.5 g/dL — ABNORMAL LOW (ref 13.0–17.0)
MCH: 31.8 pg (ref 26.0–34.0)
MCHC: 33.8 g/dL (ref 30.0–36.0)
MCV: 93.9 fL (ref 78.0–100.0)
PLATELETS: 122 10*3/uL — AB (ref 150–400)
RBC: 3.62 MIL/uL — AB (ref 4.22–5.81)
RDW: 12.3 % (ref 11.5–15.5)
WBC: 7.9 10*3/uL (ref 4.0–10.5)

## 2017-12-03 LAB — SURGICAL PCR SCREEN
MRSA, PCR: NEGATIVE
Staphylococcus aureus: NEGATIVE

## 2017-12-03 LAB — MAGNESIUM: MAGNESIUM: 1.7 mg/dL (ref 1.7–2.4)

## 2017-12-03 LAB — HEPARIN LEVEL (UNFRACTIONATED): Heparin Unfractionated: 0.21 IU/mL — ABNORMAL LOW (ref 0.30–0.70)

## 2017-12-03 LAB — COOXEMETRY PANEL
CARBOXYHEMOGLOBIN: 1.6 % — AB (ref 0.5–1.5)
METHEMOGLOBIN: 1.1 % (ref 0.0–1.5)
O2 SAT: 77 %
TOTAL HEMOGLOBIN: 11.9 g/dL — AB (ref 12.0–16.0)

## 2017-12-03 LAB — PHOSPHORUS: Phosphorus: 3.7 mg/dL (ref 2.5–4.6)

## 2017-12-03 SURGERY — IABP INSERTION
Anesthesia: LOCAL

## 2017-12-03 MED ORDER — HEPARIN SODIUM (PORCINE) 1000 UNIT/ML IJ SOLN
INTRAMUSCULAR | Status: AC
  Filled 2017-12-03: qty 1

## 2017-12-03 MED ORDER — MILRINONE LACTATE IN DEXTROSE 20-5 MG/100ML-% IV SOLN: 0.2500 ug/kg/min | INTRAVENOUS | Status: DC

## 2017-12-03 MED ORDER — MIDAZOLAM HCL 2 MG/2ML IJ SOLN
INTRAMUSCULAR | Status: DC | PRN
  Administered 2017-12-03: 1 mg via INTRAVENOUS

## 2017-12-03 MED ORDER — SODIUM CHLORIDE 0.9% FLUSH
3.0000 mL | INTRAVENOUS | Status: DC | PRN
  Administered 2017-12-05: 3 mL via INTRAVENOUS
  Filled 2017-12-03: qty 3

## 2017-12-03 MED ORDER — HEPARIN (PORCINE) IN NACL 2-0.9 UNIT/ML-% IJ SOLN: INTRAMUSCULAR | Status: DC

## 2017-12-03 MED ORDER — ATROPINE SULFATE 1 MG/10ML IJ SOSY
PREFILLED_SYRINGE | INTRAMUSCULAR | Status: AC
  Filled 2017-12-03: qty 10

## 2017-12-03 MED ORDER — NOREPINEPHRINE BITARTRATE 1 MG/ML IV SOLN
INTRAVENOUS | Status: AC | PRN
  Administered 2017-12-03: 5 ug/kg/min via INTRAVENOUS

## 2017-12-03 MED ORDER — MAGNESIUM SULFATE 2 GM/50ML IV SOLN
2.0000 g | Freq: Once | INTRAVENOUS | Status: AC
  Administered 2017-12-03: 2 g via INTRAVENOUS
  Filled 2017-12-03: qty 50

## 2017-12-03 MED ORDER — HEPARIN (PORCINE) IN NACL 100-0.45 UNIT/ML-% IJ SOLN
1250.0000 [IU]/h | INTRAMUSCULAR | Status: DC
  Administered 2017-12-03 – 2017-12-04 (×2): 1250 [IU]/h via INTRAVENOUS
  Filled 2017-12-03 (×2): qty 250

## 2017-12-03 MED ORDER — FENTANYL CITRATE (PF) 100 MCG/2ML IJ SOLN
INTRAMUSCULAR | Status: DC | PRN
  Administered 2017-12-03: 25 ug via INTRAVENOUS

## 2017-12-03 MED ORDER — INFLUENZA VAC SPLIT QUAD 0.5 ML IM SUSY
0.5000 mL | PREFILLED_SYRINGE | INTRAMUSCULAR | Status: DC
  Filled 2017-12-03: qty 0.5

## 2017-12-03 MED ORDER — ENALAPRIL MALEATE 2.5 MG PO TABS
2.5000 mg | ORAL_TABLET | Freq: Two times a day (BID) | ORAL | Status: DC
  Administered 2017-12-03 – 2017-12-04 (×4): 2.5 mg via ORAL
  Filled 2017-12-03 (×5): qty 1

## 2017-12-03 MED ORDER — IOPAMIDOL (ISOVUE-370) INJECTION 76%
INTRAVENOUS | Status: AC
  Filled 2017-12-03: qty 50

## 2017-12-03 MED ORDER — MIDAZOLAM HCL 2 MG/2ML IJ SOLN
INTRAMUSCULAR | Status: AC
  Filled 2017-12-03: qty 2

## 2017-12-03 MED ORDER — LIDOCAINE HCL (PF) 1 % IJ SOLN
INTRAMUSCULAR | Status: AC
  Filled 2017-12-03: qty 30

## 2017-12-03 MED ORDER — HEPARIN (PORCINE) IN NACL 2-0.9 UNIT/ML-% IJ SOLN
INTRAMUSCULAR | Status: AC
  Filled 2017-12-03: qty 500

## 2017-12-03 MED ORDER — PNEUMOCOCCAL VAC POLYVALENT 25 MCG/0.5ML IJ INJ
0.5000 mL | INJECTION | INTRAMUSCULAR | Status: DC
  Filled 2017-12-03: qty 0.5

## 2017-12-03 MED ORDER — SODIUM CHLORIDE 0.9 % IV SOLN: 250.0000 mL | INTRAVENOUS | Status: DC | PRN

## 2017-12-03 MED ORDER — SODIUM CHLORIDE 0.9% FLUSH: 3.0000 mL | INTRAVENOUS | Status: DC | PRN

## 2017-12-03 MED ORDER — ACETAMINOPHEN 325 MG PO TABS: 650.0000 mg | ORAL_TABLET | ORAL | Status: DC | PRN

## 2017-12-03 MED ORDER — NOREPINEPHRINE 4 MG/250ML-% IV SOLN
INTRAVENOUS | Status: AC
  Filled 2017-12-03: qty 250

## 2017-12-03 MED ORDER — MORPHINE SULFATE (PF) 4 MG/ML IV SOLN
2.0000 mg | INTRAVENOUS | Status: DC | PRN
  Administered 2017-12-03 – 2017-12-06 (×9): 2 mg via INTRAVENOUS
  Filled 2017-12-03 (×9): qty 1

## 2017-12-03 MED ORDER — FENTANYL CITRATE (PF) 100 MCG/2ML IJ SOLN
INTRAMUSCULAR | Status: AC
  Filled 2017-12-03: qty 2

## 2017-12-03 MED ORDER — SODIUM CHLORIDE 0.9% FLUSH
3.0000 mL | Freq: Two times a day (BID) | INTRAVENOUS | Status: DC
  Administered 2017-12-04 (×2): 3 mL via INTRAVENOUS

## 2017-12-03 MED ORDER — SODIUM CHLORIDE 0.9 % IV SOLN: INTRAVENOUS | Status: AC

## 2017-12-03 MED ORDER — SODIUM CHLORIDE 0.9% FLUSH: 3.0000 mL | Freq: Two times a day (BID) | INTRAVENOUS | Status: DC

## 2017-12-03 MED ORDER — SODIUM CHLORIDE 0.9 % IV SOLN: INTRAVENOUS | Status: DC

## 2017-12-03 MED ORDER — HEPARIN (PORCINE) IN NACL 2-0.9 UNIT/ML-% IJ SOLN
INTRAMUSCULAR | Status: AC | PRN
  Administered 2017-12-03: 1000 mL

## 2017-12-03 MED ORDER — MILRINONE LACTATE IN DEXTROSE 20-5 MG/100ML-% IV SOLN
0.2500 ug/kg/min | INTRAVENOUS | Status: DC
  Administered 2017-12-04 – 2017-12-06 (×3): 0.25 ug/kg/min via INTRAVENOUS
  Filled 2017-12-03 (×5): qty 100

## 2017-12-03 MED ORDER — CHLORHEXIDINE GLUCONATE 0.12 % MT SOLN
OROMUCOSAL | Status: AC
  Administered 2017-12-03: 22:00:00
  Filled 2017-12-03: qty 15

## 2017-12-03 MED ORDER — ASPIRIN 81 MG PO CHEW: 81.0000 mg | CHEWABLE_TABLET | ORAL | Status: DC

## 2017-12-03 MED ORDER — LIDOCAINE HCL (PF) 1 % IJ SOLN
INTRAMUSCULAR | Status: DC | PRN
  Administered 2017-12-03: 15 mL

## 2017-12-03 MED ORDER — ENOXAPARIN SODIUM 40 MG/0.4ML ~~LOC~~ SOLN
40.0000 mg | SUBCUTANEOUS | Status: DC
  Administered 2017-12-03: 40 mg via SUBCUTANEOUS
  Filled 2017-12-03: qty 0.4

## 2017-12-03 MED ORDER — IOPAMIDOL (ISOVUE-370) INJECTION 76%
INTRAVENOUS | Status: DC | PRN
  Administered 2017-12-03: 10 mL via INTRAVENOUS

## 2017-12-03 MED ORDER — HEPARIN SODIUM (PORCINE) 1000 UNIT/ML IJ SOLN
INTRAMUSCULAR | Status: DC | PRN
  Administered 2017-12-03: 5000 [IU] via INTRAVENOUS

## 2017-12-03 MED ORDER — ONDANSETRON HCL 4 MG/2ML IJ SOLN: 4.0000 mg | Freq: Four times a day (QID) | INTRAMUSCULAR | Status: DC | PRN

## 2017-12-03 SURGICAL SUPPLY — 9 items
BALLN IABP SENSA PLUS 8F 50CC (BALLOONS) ×2
BALLOON IABP SENS PLUS 8F 50CC (BALLOONS) ×1 IMPLANT
HOVERMATT SINGLE USE (MISCELLANEOUS) ×2 IMPLANT
PACK CARDIAC CATHETERIZATION (CUSTOM PROCEDURE TRAY) ×2 IMPLANT
PROTECTION STATION PRESSURIZED (MISCELLANEOUS) ×2
SHEATH PINNACLE 5F 10CM (SHEATH) ×2 IMPLANT
STATION PROTECTION PRESSURIZED (MISCELLANEOUS) ×1 IMPLANT
TRANSDUCER W/MONITORING KIT (MISCELLANEOUS) ×2 IMPLANT
WIRE EMERALD 3MM-J .035X150CM (WIRE) ×2 IMPLANT

## 2017-12-03 NOTE — Progress Notes (Signed)
   Patient with Vfib arrest responded to [facility] and currently without symptoms.  He has had chest soreness since is event but no acute chest pain today.  He did have the balloon pump removed today.  BP 99/68 (BP Location: Left Arm)   Pulse 65   Temp 99.5 F (37.5 C)   Resp 17   Ht 5\' 9"  (1.753 m)   Wt 163 lb 2.3 oz (74 kg)   SpO2 100%   BMI 24.09 kg/m   GENERAL:  Well appearing NECK:  No jugular venous distention, waveform within normal limits, carotid upstroke brisk and symmetric, no bruits, no thyromegaly LUNGS:  Clear to auscultation bilaterally CHEST:  Unremarkable HEART:  PMI not displaced or sustained,S1 and S2 within normal limits, no S3, no S4, no clicks, no rubs, no murmurs ABD:  Flat, positive bowel sounds normal in frequency in pitch, no bruits, no rebound, no guarding, no midline pulsatile mass, no hepatomegaly, no splenomegaly EXT:  2 plus pulses throughout, no edema, no cyanosis no clubbing  A/P  Vib arrest:  3 vessel CAD.  EF25 - 30%.    Check electrolytes including magnesium.  Note repeat EKG with more pronounced ST depression in the anterior leads.  Likely an ischemia mediated event.   Will have IABP replaced.

## 2017-12-03 NOTE — Progress Notes (Addendum)
Advanced Heart Failure Progress   Primary Cardiologist:  New to [place] (Dr. Saunders Revel) Primary HF: New (Dr. Haroldine Laws)   HPI:    IABP 1:1 on milrinone 0.3 mcg.  IABP repositioned last night as it got pulled back.   Denies SOB/CP. Low grade fevers overnight. Tearful this am discussing need for CABG,  Swan RA  9 PA 30/15 PCWP 6 Thermo CO 6.5   Co-ox 77%.  SBP 130-150 range  Echo reviewed personally. EF ~30% RV normal.   LHC 11/29/17 Left Main  Dist LM lesion 40% stenosed  Left Anterior Descending  Prox LAD-1 lesion is 60% stenosed. The lesion is calcified.  Prox LAD-2 lesion 80% stenosed  First Diagonal Branch  Vessel is small in size.  Ost 1st Diag to 1st Diag lesion 70% stenosed  Second Diagonal Branch  Vessel is small in size. There is mild disease in the vessel.  Left Circumflex  Ost Cx lesion 30% stenosed  Prox Cx lesion is 80% stenosed. The lesion is calcified.  Prox Cx to Mid Cx lesion 80% stenosed  First Obtuse Marginal Branch  Vessel is small in size.  Ost 1st Mrg lesion 90% stenosed  Second Obtuse Marginal Branch  Ost 2nd Mrg to 2nd Mrg lesion 60% stenosed  Right Coronary Artery  Collaterals  Mid RCA filled by collaterals from Prox RCA.    Prox RCA to Mid RCA lesion is 100% stenosed. The lesion is chronically occluded with bridging and left-to-right collateral flow.  Right Posterior Descending Artery  Collaterals  RPDA filled by collaterals from 2nd Sept.     Objective:    Vital Signs:   Temp:  [98.6 F (37 C)-101.3 F (38.5 C)] 98.6 F (37 C) (12/10 0745) Pulse Rate:  [72-143] 73 (12/10 0745) Resp:  [12-27] 14 (12/10 0745) BP: (90-178)/(40-85) 99/49 (12/10 0700) SpO2:  [93 %-100 %] 97 % (12/10 0745) Arterial Line BP: (90-192)/(44-74) 113/51 (12/10 0745) FiO2 (%):  [30 %-50 %] 30 % (12/09 2053) Weight:  [163 lb 2.3 oz (74 kg)] 163 lb 2.3 oz (74 kg) (12/10 0500) Last BM Date: 12/02/17  Weight change: Filed Weights   12/01/17 0500 12/02/17 0400  12/03/17 0500  Weight: 160 lb 15 oz (73 kg) 163 lb 5.8 oz (74.1 kg) 163 lb 2.3 oz (74 kg)    Intake/Output:   Intake/Output Summary (Last 24 hours) at 12/03/2017 0810 Last data filed at 12/03/2017 0700 Gross per 24 hour  Intake 1408.65 ml  Output 3390 ml  Net -1981.35 ml      Physical Exam    General:  Well appearing. No resp difficulty. In bed.  HEENT: normal x for NG tube  Neck: supple. JVP 7-8 . Carotids 2+ bilat; no bruits. No lymphadenopathy or thryomegaly appreciated. RIJ swan  Cor: PMI nondisplaced. Regular rate & rhythm. No rubs, gallops or murmurs. Lungs: clear Abdomen: soft, nontender, nondistended. No hepatosplenomegaly. No bruits or masses. Good bowel sounds. Extremities: no cyanosis, clubbing, rash, edema. R groin IABP. R and L DP pulse 2+  Neuro: alert & orientedx3, cranial nerves grossly intact. moves all 4 extremities w/o difficulty. Affect pleasant   Telemetry   NSR 70-80s personally reviewed.    EKG    11/29/17 Sinus tach 135  Labs   Basic Metabolic Panel: Recent Labs  Lab 11/30/17 0350  11/30/17 1530 12/01/17 0457 12/01/17 1805 12/02/17 0333 12/03/17 0354  NA 139  --  137 136  --  139 137  K 4.6  --  3.0*  3.5  --  3.5 4.2  CL 109  --  109 108  --  112* 106  CO2 22  --  20* 21*  --  20* 23  GLUCOSE 148*  --  117* 118*  --  134* 105*  BUN 12  --  9 6  --  5* 5*  CREATININE 1.33*  --  1.22 1.02  --  0.93 0.86  CALCIUM 8.1*  --  7.3* 7.7*  --  8.0* 8.5*  MG 2.4   < > 2.0 2.0 1.9 1.8 1.7  PHOS 2.6   < > 2.0* 1.7* 2.7 2.5 3.7   < > = values in this interval not displayed.    Liver Function Tests: Recent Labs  Lab 11/29/17 1638 12/01/17 0457  AST 78* 81*  ALT 37 30  ALKPHOS 45 35*  BILITOT 1.0 0.9  PROT 6.6 5.1*  ALBUMIN 4.1 2.9*   No results for input(s): LIPASE, AMYLASE in the last 168 hours. No results for input(s): AMMONIA in the last 168 hours.  CBC: Recent Labs  Lab 11/29/17 1638 11/29/17 1643 11/30/17 0610  12/01/17 0457 12/02/17 0333 12/03/17 0354  WBC 10.8*  --  16.8* 9.8 6.1 7.9  NEUTROABS 6.5  --   --   --   --   --   HGB 14.3 14.3 13.8 12.2* 11.5* 11.5*  HCT 42.5 42.0 39.7 36.3* 34.7* 34.0*  MCV 97.0  --  95.4 94.0 95.6 93.9  PLT 241  --  240 138* PLATELET CLUMPS NOTED ON SMEAR, COUNT APPEARS ADEQUATE 122*    Cardiac Enzymes: No results for input(s): CKTOTAL, CKMB, CKMBINDEX, TROPONINI in the last 168 hours.  BNP: BNP (last 3 results) No results for input(s): BNP in the last 8760 hours.  ProBNP (last 3 results) No results for input(s): PROBNP in the last 8760 hours.   CBG: Recent Labs  Lab 12/02/17 1713 12/02/17 1955 12/02/17 2335 12/03/17 0409 12/03/17 0746  GLUCAP 101* 103* 89 98 105*    Coagulation Studies: Recent Labs    12/01/17 0457  LABPROT 15.3*  INR 1.22    Imaging   Dg Chest Port 1 View  Result Date: 12/03/2017 CLINICAL DATA:  56 y/o M; counter for management the intra-aortic balloon pump. EXAM: PORTABLE CHEST 1 VIEW COMPARISON:  12/03/2017 chest radiograph FINDINGS: Intra-aortic balloon pump is positioned 4.5 cm below aortic arch. Stable position of Swan-Ganz catheter. Enteric tube tip below field of view in the abdomen. Clear lungs. Stable chronic rib fractures. IMPRESSION: Intra-aortic balloon pump metallic marker positioned 4.5 cm below aortic arch. Electronically Signed   By: Kristine Garbe M.D.   On: 12/03/2017 02:06   Dg Chest Port 1 View  Result Date: 12/03/2017 CLINICAL DATA:  56 y/o M; potential for an appropriate finding of intra aortic balloon pump. EXAM: PORTABLE CHEST 1 VIEW COMPARISON:  12/02/2017 chest radiograph. FINDINGS: Stable cardiac silhouette given projection and technique. Metallic marker of balloon pump has migrated 3.3 cm below aortic arch from prior radiograph. Swan-Ganz catheter in situ. Enteric tube tip extends below the field of view into the abdomen. Multiple bilateral chronic rib fractures. Minor atelectasis in  lung bases. IMPRESSION: Metallic marker for intra aortic balloon pump has migrated 3.3 cm below aortic arch. These results will be called to the ordering clinician or representative by the Radiologist Assistant, and communication documented in the PACS or zVision Dashboard. Electronically Signed   By: Kristine Garbe M.D.   On: 12/03/2017 01:25  Medications:     Current Medications: . aspirin  81 mg Oral Daily  . atorvastatin  80 mg Oral q1800  . chlorhexidine gluconate (MEDLINE KIT)  15 mL Mouth Rinse BID  . Chlorhexidine Gluconate Cloth  6 each Topical Daily  . digoxin  0.125 mg Oral Daily  . folic acid  1 mg Oral Daily  . insulin aspart  2-6 Units Subcutaneous Q4H  . mouth rinse  15 mL Mouth Rinse BID  . pantoprazole  40 mg Oral QHS  . spironolactone  12.5 mg Oral Daily  . thiamine  100 mg Oral Daily    Infusions: . sodium chloride 10 mL/hr at 12/03/17 0700  . dexmedetomidine (PRECEDEX) IV infusion 0.4 mcg/kg/hr (12/03/17 0700)  . heparin 1,200 Units/hr (12/03/17 0700)  . milrinone 0.375 mcg/kg/min (12/03/17 0700)  . norepinephrine (LEVOPHED) Adult infusion Stopped (12/02/17 1205)  . piperacillin-tazobactam (ZOSYN)  IV 3.375 g (12/03/17 0649)    Patient Profile   [name] is a 56 y.o. male with unknown medical history who presented to [facility]/29/17 after VT/VF arrest. Intubated emergently in ER. Chest CT with RUL collapse. Pt underwent bronch and taken emergently to cath lab.  Cath showed Severe 3v CAD and elevated LVEDP. IABP placed and started on milrinone with low output CHF.   Assessment/Plan   1. VT/VF Arrest -> Cardiogenic shock - EF 25-30% by echo due to [place] on IABP 1:1 milrinone 0.3 mcg.  - Volume status stable.   - Dr. Prescott Gum has seen not ready for CABG yet with fevers.  - Would keep IABP in until CABG with critical CAD  2. CAD - Severe 3vD as above - Continue IABP. CO-OX 77%.   - On ASA/statin and heaprin  3. Acute  systolic CHF due to ICM - EF 25-30% - Avoid BB with low output and need for inotropes - Can wean milrinone to 0.2 - Continue spiro 12.5 mg daily as tolerated - Continue digoxin 0.125 mg daily.  - Add low-dose enalapril - Renal function stable.     4. Acute respiratory failure with RUL collapse by CT.  - Self extubated 12/9 . CCM following.   5. Severe ETOH abuse - Family reports he drinks heavily - Continue CIWA  6. Hypokalemia - K 4.2. Stable.    Length of Stay: 4  Amy Clegg, NP  12/03/2017, 8:10 AM  Advanced Heart Failure Team Pager [phone] (M-F; [place])  Please contact [facility] for night-coverage after hours (4p -7a ) and weekends on [email or web address]   Agree with above. Remains on IABP 1:1 and milrinone 0.3. Swan numbers and co-ox reviewed personally. Hemodynamics much improved.  Initial plan was to keep IABP in until CABG due to severity of CAD but given fevers would like to remove IABP and swan today. I have reviewed cath films personally and I think it it will be OK. I will d/w with Dr. Prescott Gum. Volume status ok. Rhythm stable. Add enalapril. Continue CIWA protocol.   Will need repeat CXR this am to reassess IABP position.   On exam Lying in bed NGT in place Neck RIJ swan Cor: RRR Lungs: decreased at bases Ab soft NT Ext: no edema. R Groin IABP   Plan as above. Turn heparin off. Turn IABP 1:3 then pull.   CRITICAL CARE Performed by: Glori Bickers  Total critical care time: 35 minutes  Critical care time was exclusive of separately billable procedures and treating other patients.  Critical care  was necessary to treat or prevent imminent or life-threatening deterioration.  Critical care was time spent personally by me (independent of midlevel providers or residents) on the following activities: development of treatment plan with patient and/or surrogate as well as nursing, discussions with consultants, evaluation of patient's response to treatment,  examination of patient, obtaining history from patient or surrogate, ordering and performing treatments and interventions, ordering and review of laboratory studies, ordering and review of radiographic studies, pulse oximetry and re-evaluation of patient's condition.  Glori Bickers, MD  8:56 AM

## 2017-12-03 NOTE — Interval H&P Note (Signed)
History and Physical Interval Note:  12/03/2017 7:42 PM  [name]  has presented today for surgery, with the diagnosis of v-fib  The various methods of treatment have been discussed with the patient and family. After consideration of risks, benefits and other options for treatment, the patient has consented to  Procedure(s): IABP INSERTION (N/A) as a surgical intervention .  The patient's history has been reviewed, patient examined, no change in status, stable for surgery.  I have reviewed the patient's chart and labs.  Questions were answered to the patient's satisfaction.     Daniel [name]

## 2017-12-03 NOTE — Progress Notes (Signed)
PULMONARY / CRITICAL CARE MEDICINE   Name: Briant CedarWesley Clay Trang MRN: [id number] DOB: 05/28/1961    ADMISSION DATE:  11/29/2017 CONSULTATION DATE:  11/29/2017  REFERRING MD:  Page EDP  CHIEF COMPLAINT:  Cardiac arrest  HISTORY OF PRESENT ILLNESS:   56 y/o ETOH user  admitted on 12/6 with a Vfib arrest-not cooled as he became responsive post intubation.  Patient woke up but started suffering withdrawal and precedex was started.   STUDIES:  Head CT negative  CULTURES: Blood 12/6>>> Urine 12/6>>>ng Sputum 12/6>>>neg  ANTIBIOTICS: Unasyn 12/6>>> 12/6 Zosyn 12/6 >  Vanc 12/6 > 12/7  SIGNIFICANT EVENTS: 12/6 cardiac arrest  LINES/TUBES: ETT 12/6>>> 12/9 (self extubated) OGT 12/6>>> Foley 12/6>>> R IJ swan >>>   SUBJECTIVE:  Denies CP, dyspnea Talking in full sentences, slight hoarse voice  VITAL SIGNS: BP 140/68   Pulse 74   Temp 99.3 F (37.4 C)   Resp 13   Ht 5\' 9"  (1.753 m)   Wt 163 lb 2.3 oz (74 kg)   SpO2 98%   BMI 24.09 kg/m   HEMODYNAMICS: PAP: (21-46)/(2-30) 23/9 CVP:  [0 mmHg-21 mmHg] 4 mmHg PCWP:  [6 mmHg-7 mmHg] 6 mmHg CO:  [7.4 L/min-10.2 L/min] 7.5 L/min CI:  [3.6 L/min/m2-5 L/min/m2] 3.6 L/min/m2  VENTILATOR SETTINGS: FiO2 (%):  [30 %-50 %] 30 %  INTAKE / OUTPUT: I/O last 3 completed shifts: In: 3570.4 [I.V.:1967.9; NG/GT:1340; IV Piggyback:262.5] Out: 4098 [[id number] [Urine:4425; Emesis/NG output:280]  PHYSICAL EXAMINATION: General: Well-nourished white male currently resting comfortably in bed  HEENT: Nasogastric tube in place.  Normocephalic atraumatic mucous membranes are moist. Pulmonary: Decreased bases, no accessory muscle use Cardiac: Regular rate and rhythm Abdomen: Nontender positive bowel sounds no organomegaly GU: Clear yellow urine Neuro/psych: Speech is raspy, moves all extremities, oriented x3 but remains impulsive at times Extremities/musculoskeletal: Warm, dry, no significant edema brisk cap refill positive  pulses.  LABS:  BMET Recent Labs  Lab 12/01/17 0457 12/02/17 0333 12/03/17 0354  NA 136 139 137  K 3.5 3.5 4.2  CL 108 112* 106  CO2 21* 20* 23  BUN 6 5* 5*  CREATININE 1.02 0.93 0.86  GLUCOSE 118* 134* 105*   Electrolytes Recent Labs  Lab 12/01/17 0457 12/01/17 1805 12/02/17 0333 12/03/17 0354  CALCIUM 7.7*  --  8.0* 8.5*  MG 2.0 1.9 1.8 1.7  PHOS 1.7* 2.7 2.5 3.7   CBC Recent Labs  Lab 12/01/17 0457 12/02/17 0333 12/03/17 0354  WBC 9.8 6.1 7.9  HGB 12.2* 11.5* 11.5*  HCT 36.3* 34.7* 34.0*  PLT 138* PLATELET CLUMPS NOTED ON SMEAR, COUNT APPEARS ADEQUATE 122*   Coag's Recent Labs  Lab 11/29/17 1638 12/01/17 0457  INR 1.11 1.22    Sepsis Markers Recent Labs  Lab 11/29/17 2232 11/30/17 0350 12/01/17 0457  PROCALCITON 1.74 1.94 1.06    ABG Recent Labs  Lab 11/29/17 2050 11/30/17 0422 12/02/17 0345  PHART 7.352 7.431 7.385  PCO2ART 34.9 33.0 30.5*  PO2ART 117.0* 159* 121.0*    Liver Enzymes Recent Labs  Lab 11/29/17 1638 12/01/17 0457  AST 78* 81*  ALT 37 30  ALKPHOS 45 35*  BILITOT 1.0 0.9  ALBUMIN 4.1 2.9*    Cardiac Enzymes No results for input(s): TROPONINI, PROBNP in the last 168 hours.  Glucose Recent Labs  Lab 12/02/17 1137 12/02/17 1713 12/02/17 1955 12/02/17 2335 12/03/17 0409 12/03/17 0746  GLUCAP 120* 101* 103* 89 98 105*    Imaging Dg Chest Port 1 View  Result Date: 12/03/2017 CLINICAL  DATA:  56 y/o M; counter for management the intra-aortic balloon pump. EXAM: PORTABLE CHEST 1 VIEW COMPARISON:  12/03/2017 chest radiograph FINDINGS: Intra-aortic balloon pump is positioned 4.5 cm below aortic arch. Stable position of Swan-Ganz catheter. Enteric tube tip below field of view in the abdomen. Clear lungs. Stable chronic rib fractures. IMPRESSION: Intra-aortic balloon pump metallic marker positioned 4.5 cm below aortic arch. Electronically Signed   By: Mitzi HansenLance  Furusawa-Stratton M.D.   On: 12/03/2017 02:06   Dg Chest  Port 1 View  Result Date: 12/03/2017 CLINICAL DATA:  56 y/o M; potential for an appropriate finding of intra aortic balloon pump. EXAM: PORTABLE CHEST 1 VIEW COMPARISON:  12/02/2017 chest radiograph. FINDINGS: Stable cardiac silhouette given projection and technique. Metallic marker of balloon pump has migrated 3.3 cm below aortic arch from prior radiograph. Swan-Ganz catheter in situ. Enteric tube tip extends below the field of view into the abdomen. Multiple bilateral chronic rib fractures. Minor atelectasis in lung bases. IMPRESSION: Metallic marker for intra aortic balloon pump has migrated 3.3 cm below aortic arch. These results will be called to the ordering clinician or representative by the Radiologist Assistant, and communication documented in the PACS or zVision Dashboard. Electronically Signed   By: Mitzi HansenLance  Furusawa-Stratton M.D.   On: 12/03/2017 01:25       DISCUSSION: 56 y/o male with three vessel CAD s/p Vfib arrest.  He aspirated, has respiratory failure.  Self extubated 12/6  ASSESSMENT / PLAN:   V. fib arrest followed by cardiogenic shock CAD Plan telemetry  Ct heparin drip Hemodynamics Per heart failure/cardiology/thoracic surgery team IABP removal planned  CABG at some point  Acute respiratory failure with hypoxemia > resolved Aspiration pneumonia RUL -Self extubated 12/9 Plan Decrease oxygen Incentive spirometry oob once IABP out Zosyn x 5ds   AKI -resolved Plan KVO IV fluids   Anemia of critical illness Hemoglobin stable without evidence of active bleeding Plan Serial CBCs Transfuse per ICU protocol    Acute encephalopathy>resolved Concern for ETOH withdrawal - h/o DTs but pt states last drink a week ago  Taper Precedex  RASS goal 0 Delirium interventions including lights on during the day lights off at night winds up during the day and frequent reorientation Continue thiamine and folate  Can advance PO  FAMILY  - Updates: none bedside  -  Inter-disciplinary family meet or Palliative Care meeting due by:  day 7  PCCM available as needed  Cyril Mourningakesh Mckynzie Liwanag MD. [facility]CCP. [facility] Pager [phone] If no response call [phone]   12/03/2017

## 2017-12-03 NOTE — Progress Notes (Signed)
ANTICOAGULATION CONSULT NOTE Pharmacy Consult for Heparin Indication: IABP  No Known Allergies  Patient Measurements: Height: 5\' 9"  (175.3 cm) Weight: 163 lb 2.3 oz (74 kg) IBW/kg (Calculated) : 70.7   Vital Signs: Temp: 98.6 F (37 C) (12/10 0800) Temp Source: Core (Comment) (12/10 0800) BP: 125/93 (12/10 0800) Pulse Rate: 84 (12/10 0800)  Labs: Recent Labs    12/01/17 0457  12/02/17 0333 12/02/17 0400 12/02/17 1330 12/03/17 0354  HGB 12.2*  --  11.5*  --   --  11.5*  HCT 36.3*  --  34.7*  --   --  34.0*  PLT 138*  --  PLATELET CLUMPS NOTED ON SMEAR, COUNT APPEARS ADEQUATE  --   --  122*  LABPROT 15.3*  --   --   --   --   --   INR 1.22  --   --   --   --   --   HEPARINUNFRC  --    < >  --  0.20* 0.28* 0.21*  CREATININE 1.02  --  0.93  --   --  0.86   < > = values in this interval not displayed.    Estimated Creatinine Clearance: 98.2 mL/min (by C-G formula based on SCr of 0.86 mg/dL).  . sodium chloride 10 mL/hr at 12/03/17 0700  . dexmedetomidine (PRECEDEX) IV infusion 0.2 mcg/kg/hr (12/03/17 0800)  . heparin 1,200 Units/hr (12/03/17 0800)  . magnesium sulfate 1 - 4 g bolus IVPB    . milrinone 0.375 mcg/kg/min (12/03/17 0800)  . norepinephrine (LEVOPHED) Adult infusion Stopped (12/02/17 1205)  . piperacillin-tazobactam (ZOSYN)  IV 3.375 g (12/03/17 [id number])     Assessment: 56 y.o. male sp cardiac arrest/cath/IABP placement, awaiting CABG, on heparin.  Heparin level 0.2 at goal heparin drip 1200 units/hr.    Goal of Therapy:  Heparin level 0.2-0.5 units/ml Monitor platelets by anticoagulation protocol: Yes   Plan:  Continue heparin infusion at 1200 units/hr Monitor daily HL and CBC  Leota SauersLisa Ayauna Mcnay Pharm.D. CPP, BCPS Clinical Pharmacist [phone] 12/03/2017 8:40 AM

## 2017-12-03 NOTE — Code Documentation (Signed)
  Patient Name: [name]   MRN: [id number]   Date of Birth/ Sex: 04/22/1961 , male      Admission Date: 11/29/2017  Attending Provider: [name], [name], MD  Primary Diagnosis: Cardiac arrest   Indication: Pt was in his usual state of health until this PM, when he was noted to be Ventricular fibrillation. Code blue was subsequently called. At the time of arrival on scene, ACLS protocol was underway.   Technical Description:  - CPR performance duration:  4 minutes  - Was defibrillation or cardioversion used? Yes   - Was external pacer placed? No  - Was patient intubated pre/post CPR? No   Medications Administered: Y = Yes; Blank = No Amiodarone    Atropine    Calcium    Epinephrine  y  Lidocaine    Magnesium    Norepinephrine    Phenylephrine    Sodium bicarbonate    Vasopressin     Post CPR evaluation:  - Final Status - Was patient successfully resuscitated ? Yes - What is current rhythm? NSR - What is current hemodynamic status? stable  Miscellaneous Information:  - Labs sent, including: Ekg  - Primary team notified?  Yes  - Family Notified? No  - Additional notes/ transfer status:      Lorenso Courierhundi, Allisen Pidgeon, MD  12/03/2017, 6:37 PM

## 2017-12-03 NOTE — Progress Notes (Signed)
ANTICOAGULATION CONSULT NOTE  Pharmacy Consult:  Heparin Indication: IABP  No Known Allergies  Patient Measurements: Height: 5\' 9"  (175.3 cm) Weight: 163 lb 2.3 oz (74 kg) IBW/kg (Calculated) : 70.7  Heparin dosing weight = 74 kg   Vital Signs: Temp: 99.5 F (37.5 C) (12/10 1639) Temp Source: Oral (12/10 1207) BP: 115/75 (12/10 2018) Pulse Rate: 0 (12/10 2028)  Labs: Recent Labs    12/01/17 0457  12/02/17 0333 12/02/17 0400 12/02/17 1330 12/03/17 0354  HGB 12.2*  --  11.5*  --   --  11.5*  HCT 36.3*  --  34.7*  --   --  34.0*  PLT 138*  --  PLATELET CLUMPS NOTED ON SMEAR, COUNT APPEARS ADEQUATE  --   --  122*  LABPROT 15.3*  --   --   --   --   --   INR 1.22  --   --   --   --   --   HEPARINUNFRC  --    < >  --  0.20* 0.28* 0.21*  CREATININE 1.02  --  0.93  --   --  0.86   < > = values in this interval not displayed.    Estimated Creatinine Clearance: 98.2 mL/min (by C-G formula based on SCr of 0.86 mg/dL).    Assessment: [age] YOM s/p cardiac arrest/cath/IABP placement and started on IV heparin.  IABP removed earlier today and IV heparin was transitioned to SQ Lovenox, which the patient received a dose around 1930.  Patient again suffered from cardiac arrest on 12/03/17 and IABP was re-inserted.  Pharmacy consulted to resume IV heparin.  Noted that patient received 5000 units bolus in the cath lab and heparin level was at the low end of goal on 1200 units/hr.   Goal of Therapy:  Heparin level 0.2-0.5 units/ml Monitor platelets by anticoagulation protocol: Yes    Plan:  Resume heparin infusion at 1250 units/hr, no bolus Check 6 hr heparin level Daily heparin level and CBC   Miyoko Hashimi D. Laney Potashang, PharmD, BCPS Pager:  [phone] 12/03/2017, 8:46 PM

## 2017-12-03 NOTE — Progress Notes (Signed)
4 Days Post-Op Procedure(s) (LRB): LEFT HEART CATH AND CORONARY ANGIOGRAPHY (N/A) IABP Insertion (N/A) RIGHT HEART CATH (N/A) Subjective: Self extubated Remains febrile 101 Hemodynamics stable Not ready for CABG  Objective: Vital signs in last 24 hours: Temp:  [98.6 F (37 C)-101.3 F (38.5 C)] 98.6 F (37 C) (12/10 0745) Pulse Rate:  [72-143] 73 (12/10 0745) Cardiac Rhythm: Normal sinus rhythm (12/10 0400) Resp:  [12-27] 14 (12/10 0745) BP: (90-178)/(40-85) 99/49 (12/10 0700) SpO2:  [93 %-100 %] 97 % (12/10 0745) Arterial Line BP: (90-192)/(44-74) 113/51 (12/10 0745) FiO2 (%):  [30 %-50 %] 30 % (12/09 2053) Weight:  [163 lb 2.3 oz (74 kg)] 163 lb 2.3 oz (74 kg) (12/10 0500)  Hemodynamic parameters for last 24 hours: PAP: (21-46)/(2-30) 26/9 CVP:  [0 mmHg-21 mmHg] 3 mmHg PCWP:  [6 mmHg-7 mmHg] 6 mmHg CO:  [6.5 L/min-10.2 L/min] 7.5 L/min CI:  [3.2 L/min/m2-5 L/min/m2] 3.6 L/min/m2  Intake/Output from previous day: 12/09 0701 - 12/10 0700 In: 1645.5 [I.V.:1103; NG/GT:380; IV Piggyback:162.5] Out: 3550 [Urine:3300; Emesis/NG output:250] Intake/Output this shift: No intake/output data recorded.  nsr Neuro intact  Lab Results: Recent Labs    12/02/17 0333 12/03/17 0354  WBC 6.1 7.9  HGB 11.5* 11.5*  HCT 34.7* 34.0*  PLT PLATELET CLUMPS NOTED ON SMEAR, COUNT APPEARS ADEQUATE 122*   BMET:  Recent Labs    12/02/17 0333 12/03/17 0354  NA 139 137  K 3.5 4.2  CL 112* 106  CO2 20* 23  GLUCOSE 134* 105*  BUN 5* 5*  CREATININE 0.93 0.86  CALCIUM 8.0* 8.5*    PT/INR:  Recent Labs    12/01/17 0457  LABPROT 15.3*  INR 1.22   ABG    Component Value Date/Time   PHART 7.385 12/02/2017 0345   HCO3 18.3 (L) 12/02/2017 0345   TCO2 19 (L) 12/02/2017 0345   ACIDBASEDEF 6.0 (H) 12/02/2017 0345   O2SAT 77.0 12/03/2017 0410   CBG (last 3)  Recent Labs    12/02/17 2335 12/03/17 0409 12/03/17 0746  GLUCAP 89 98 105*    Assessment/Plan: S/P Procedure(s)  (LRB): LEFT HEART CATH AND CORONARY ANGIOGRAPHY (N/A) IABP Insertion (N/A) RIGHT HEART CATH (N/A) Needs CABG but not ready rec wean IABP or transition to impella 5.0 if he needs mechanical support   LOS: 4 days    Kathlee Nationseter Van Trigt III 12/03/2017

## 2017-12-03 NOTE — Progress Notes (Signed)
Order for sheath removal verified per post procedural orders. Procedure explained to patient and femoral  artery access site assessed: level 0, palpable dorsalis pedis and posterior tibial pulses.7.5 French Sheath removed and manual pressure applied for 25 minutes. Pre, peri, & post procedural vitals: HR 70, RR 12, O2 Sat 97%, BP 170/80. Pain level 0. Distal pulses remained intact after sheath removal. Access site level 0 and dressed with 4X4 gauze and tegaderm. RN confirmed condition of site. Post procedural instructions discussed with return demonstration from patient.

## 2017-12-03 NOTE — Progress Notes (Signed)
Cardiology paged to bedside to assess pt's IABP and inadequate gas filling despite troubleshooting by several RN's; STAT CXR done. Dr. Cristal Deerhristopher advanced the IABP and dressing was changed. After above interventions, IABP is working appropriately and follow up CXR ordered. Pt's vitals remained stable throughout and pt verbalized understanding of the interventions. Will continue to monitor pt closely.

## 2017-12-03 NOTE — H&P (View-Only) (Signed)
   Patient with Vfib arrest responded to D[facility] and currently without symptoms.  He has had chest soreness since is event but no acute chest pain today.  He did have the balloon pump removed today.  BP 99/68 (BP Location: Left Arm)   Pulse 65   Temp 99.5 F (37.5 C)   Resp 17   Ht 5' 9" (1.753 m)   Wt 163 lb 2.3 oz (74 kg)   SpO2 100%   BMI 24.09 kg/m   GENERAL:  Well appearing NECK:  No jugular venous distention, waveform within normal limits, carotid upstroke brisk and symmetric, no bruits, no thyromegaly LUNGS:  Clear to auscultation bilaterally CHEST:  Unremarkable HEART:  PMI not displaced or sustained,S1 and S2 within normal limits, no S3, no S4, no clicks, no rubs, no murmurs ABD:  Flat, positive bowel sounds normal in frequency in pitch, no bruits, no rebound, no guarding, no midline pulsatile mass, no hepatomegaly, no splenomegaly EXT:  2 plus pulses throughout, no edema, no cyanosis no clubbing  A/P  Vib arrest:  3 vessel CAD.  EF25 - 30%.    Check electrolytes including magnesium.  Note repeat EKG with more pronounced ST depression in the anterior leads.  Likely an ischemia mediated event.   Will have IABP replaced.   

## 2017-12-04 ENCOUNTER — Encounter (HOSPITAL_COMMUNITY): Payer: Self-pay | Admitting: Internal Medicine

## 2017-12-04 ENCOUNTER — Inpatient Hospital Stay (HOSPITAL_COMMUNITY): Payer: Medicaid Other

## 2017-12-04 DIAGNOSIS — I5030 Unspecified diastolic (congestive) heart failure: Secondary | ICD-10-CM

## 2017-12-04 DIAGNOSIS — I4901 Ventricular fibrillation: Secondary | ICD-10-CM

## 2017-12-04 LAB — CULTURE, BLOOD (ROUTINE X 2)
Culture: NO GROWTH
Culture: NO GROWTH
Special Requests: ADEQUATE
Special Requests: ADEQUATE

## 2017-12-04 LAB — URINALYSIS, ROUTINE W REFLEX MICROSCOPIC
Bilirubin Urine: NEGATIVE
Glucose, UA: NEGATIVE mg/dL
Ketones, ur: 20 mg/dL — AB
Nitrite: NEGATIVE
Protein, ur: 30 mg/dL — AB
Specific Gravity, Urine: 1.018 (ref 1.005–1.030)
Squamous Epithelial / LPF: NONE SEEN
pH: 7 (ref 5.0–8.0)

## 2017-12-04 LAB — BASIC METABOLIC PANEL
ANION GAP: 7 (ref 5–15)
Anion gap: 6 (ref 5–15)
BUN: 8 mg/dL (ref 6–20)
BUN: 8 mg/dL (ref 6–20)
CALCIUM: 8.4 mg/dL — AB (ref 8.9–10.3)
CHLORIDE: 106 mmol/L (ref 101–111)
CO2: 25 mmol/L (ref 22–32)
CO2: 25 mmol/L (ref 22–32)
CREATININE: 0.98 mg/dL (ref 0.61–1.24)
Calcium: 8.5 mg/dL — ABNORMAL LOW (ref 8.9–10.3)
Chloride: 107 mmol/L (ref 101–111)
Creatinine, Ser: 0.94 mg/dL (ref 0.61–1.24)
GFR calc Af Amer: >60 mL/min (ref >60)
GFR calc Af Amer: >60 mL/min (ref >60)
GFR calc non Af Amer: >60 mL/min (ref >60)
GLUCOSE: 108 mg/dL — AB (ref 65–99)
GLUCOSE: 105 mg/dL — AB (ref 65–99)
POTASSIUM: 4.1 mmol/L (ref 3.5–5.1)
POTASSIUM: 4.1 mmol/L (ref 3.5–5.1)
SODIUM: 138 mmol/L (ref 135–145)
SODIUM: 138 mmol/L (ref 135–145)

## 2017-12-04 LAB — COOXEMETRY PANEL
CARBOXYHEMOGLOBIN: 1 % (ref 0.5–1.5)
Carboxyhemoglobin: 1.1 % (ref 0.5–1.5)
METHEMOGLOBIN: 1.2 % (ref 0.0–1.5)
Methemoglobin: 1.3 % (ref 0.0–1.5)
O2 SAT: 75.5 %
O2 Saturation: 74 %
Total hemoglobin: 12.1 g/dL (ref 12.0–16.0)
Total hemoglobin: 12.4 g/dL (ref 12.0–16.0)

## 2017-12-04 LAB — GLUCOSE, CAPILLARY
GLUCOSE-CAPILLARY: 112 mg/dL — AB (ref 65–99)
GLUCOSE-CAPILLARY: 117 mg/dL — AB (ref 65–99)
GLUCOSE-CAPILLARY: 147 mg/dL — AB (ref 65–99)
Glucose-Capillary: 104 mg/dL — ABNORMAL HIGH (ref 65–99)
Glucose-Capillary: 139 mg/dL — ABNORMAL HIGH (ref 65–99)
Glucose-Capillary: 114 mg/dL — ABNORMAL HIGH (ref 65–99)

## 2017-12-04 LAB — CBC
HEMATOCRIT: 36 % — AB (ref 39.0–52.0)
HEMOGLOBIN: 12.1 g/dL — AB (ref 13.0–17.0)
MCH: 31.8 pg (ref 26.0–34.0)
MCHC: 33.6 g/dL (ref 30.0–36.0)
MCV: 94.5 fL (ref 78.0–100.0)
Platelets: 178 10*3/uL (ref 150–400)
RBC: 3.81 MIL/uL — ABNORMAL LOW (ref 4.22–5.81)
RDW: 12.4 % (ref 11.5–15.5)
WBC: 9.3 10*3/uL (ref 4.0–10.5)

## 2017-12-04 LAB — TSH: TSH: 1.844 u[IU]/mL (ref 0.350–4.500)

## 2017-12-04 LAB — MAGNESIUM
MAGNESIUM: 2.2 mg/dL (ref 1.7–2.4)
Magnesium: 2.1 mg/dL (ref 1.7–2.4)

## 2017-12-04 LAB — HEPARIN LEVEL (UNFRACTIONATED)
HEPARIN UNFRACTIONATED: 0.34 [IU]/mL (ref 0.30–0.70)
HEPARIN UNFRACTIONATED: 0.21 [IU]/mL — AB (ref 0.30–0.70)

## 2017-12-04 LAB — PREALBUMIN: PREALBUMIN: 12.8 mg/dL — AB (ref 18–38)

## 2017-12-04 LAB — PHOSPHORUS: PHOSPHORUS: 3 mg/dL (ref 2.5–4.6)

## 2017-12-04 LAB — ECHOCARDIOGRAM COMPLETE
Height: 69 in
Weight: 2437.41 oz

## 2017-12-04 MED ORDER — INFLUENZA VAC SPLIT QUAD 0.5 ML IM SUSY: 0.5000 mL | PREFILLED_SYRINGE | INTRAMUSCULAR | Status: DC | PRN

## 2017-12-04 MED ORDER — PNEUMOCOCCAL VAC POLYVALENT 25 MCG/0.5ML IJ INJ: 0.5000 mL | INJECTION | INTRAMUSCULAR | Status: DC | PRN

## 2017-12-04 MED ORDER — CHLORHEXIDINE GLUCONATE CLOTH 2 % EX PADS
6.0000 | MEDICATED_PAD | Freq: Every day | CUTANEOUS | Status: DC
  Administered 2017-12-04: 6 via TOPICAL

## 2017-12-04 MED ORDER — ENSURE ENLIVE PO LIQD
237.0000 mL | Freq: Three times a day (TID) | ORAL | Status: DC
  Administered 2017-12-05: 237 mL via ORAL

## 2017-12-04 MED ORDER — TRAVASOL 10 % IV SOLN: INTRAVENOUS | Status: DC

## 2017-12-04 MED ORDER — PRO-STAT SUGAR FREE PO LIQD
30.0000 mL | Freq: Two times a day (BID) | ORAL | Status: DC
  Administered 2017-12-04 – 2017-12-05 (×2): 30 mL via ORAL
  Filled 2017-12-04 (×2): qty 30

## 2017-12-04 MED ORDER — SODIUM CHLORIDE 0.9% FLUSH: 10.0000 mL | INTRAVENOUS | Status: DC | PRN

## 2017-12-04 MED ORDER — SODIUM CHLORIDE 0.9% FLUSH
10.0000 mL | Freq: Two times a day (BID) | INTRAVENOUS | Status: DC
  Administered 2017-12-04 – 2017-12-06 (×3): 10 mL

## 2017-12-04 MED ORDER — ORAL CARE MOUTH RINSE
15.0000 mL | Freq: Two times a day (BID) | OROMUCOSAL | Status: DC
  Administered 2017-12-04 – 2017-12-06 (×3): 15 mL via OROMUCOSAL

## 2017-12-04 NOTE — Progress Notes (Signed)
ANTICOAGULATION CONSULT NOTE  Pharmacy Consult:  Heparin Indication: IABP  No Known Allergies  Patient Measurements: Height: 5\' 9"  (175.3 cm) Weight: 152 lb 5.4 oz (69.1 kg) IBW/kg (Calculated) : 70.7  Heparin dosing weight = 74 kg   Vital Signs: Temp: 98.2 F (36.8 C) (12/11 1100) Temp Source: Oral (12/11 1100) BP: 153/102 (12/11 1200) Pulse Rate: 85 (12/11 1100)  Labs: Recent Labs    12/02/17 0333  12/03/17 0052 12/03/17 0354 12/04/17 0331 12/04/17 1105  HGB 11.5*  --   --  11.5* 12.1*  --   HCT 34.7*  --   --  34.0* 36.0*  --   PLT PLATELET CLUMPS NOTED ON SMEAR, COUNT APPEARS ADEQUATE  --   --  122* 178  --   HEPARINUNFRC  --    < >  --  0.21* 0.34 0.21*  CREATININE 0.93  --  0.98 0.86 0.94  --    < > = values in this interval not displayed.    Estimated Creatinine Clearance: 87.8 mL/min (by C-G formula based on SCr of 0.94 mg/dL).    Assessment: [age] YOM s/p cardiac arrest/cath/IABP placement and started on IV heparin.  IABP removed earlier today and IV heparin was transitioned to SQ Lovenox, which the patient received a dose around 1930.  Patient again suffered from cardiac arrest on 12/03/17 and IABP was re-inserted.  Pharmacy consulted to resume IV heparin.  Noted that patient received 5000 units bolus in the cath lab  Heparin 1250 uts/hr HL 0.21 at goal.  CBC ok.   Goal of Therapy:  Heparin level 0.2-0.5 units/ml Monitor platelets by anticoagulation protocol: Yes    Plan:  Continue  heparin infusion at 1250 units/hr Daily heparin level and CBC  Leota SauersLisa Samira Acero Pharm.D. CPP, BCPS Clinical Pharmacist [phone] 12/04/2017 1:18 PM

## 2017-12-04 NOTE — Progress Notes (Addendum)
Peripherally Inserted Central Catheter/Midline Placement  The IV Nurse has discussed with the patient and/or persons authorized to consent for the patient, the purpose of this procedure and the potential benefits and risks involved with this procedure.  The benefits include less needle sticks, lab draws from the catheter, and the patient may be discharged home with the catheter. Risks include, but not limited to, infection, bleeding, blood clot (thrombus formation), and puncture of an artery; nerve damage and irregular heartbeat and possibility to perform a PICC exchange if needed/ordered by physician.  Alternatives to this procedure were also discussed.  Bard Power PICC patient education guide, fact sheet on infection prevention and patient information card has been provided to patient /or left at bedside.  Per [place], RN place DL PICC.  PICC/Midline Placement Documentation  PICC Double Lumen 12/04/17 PICC Left Basilic 46 cm 0 cm (Active)       Cloa Bushong, Lajean ManesKerry Loraine 12/04/2017, 6:11 PM

## 2017-12-04 NOTE — Progress Notes (Signed)
PHARMACY - ADULT TOTAL PARENTERAL NUTRITION CONSULT NOTE   Pharmacy Consult for TPN Indication: expected prolonged NPO status  Patient Measurements: Height: 5\' 9"  (175.3 cm) Weight: 152 lb 5.4 oz (69.1 kg) IBW/kg (Calculated) : 70.7  Admit weight: 87 kg on 12/6   Body mass index is 22.5 kg/m.  Assessment: [age] yom with unknown PMH admitted 11/29/17 after VT/VF arrest, now s/p emergent cath and IABP placed with recurrent VF arrest 12/10 after IABP removed (re-inserted). CABG scheduled for 12/14. Pharmacy consulted to start TPN for expected prolonged NPO status per discussion with PVT. Has been on tube feeds this admit - d/c'd 12/9. Self extubated 12/9.  Update: NG tube out, now trying to advance diet 12/11. TPN if unsuccessful  GI: Tube feeds stopped 12/9. Cardiac diet started 12/10 - appetite documented "good" per charting. 1100 cc NGT output in last 24hrs - now NG out. LBM 12/9. Albumin low at 2.9, prealbumin low at 12.8. Zofran prn, PPI PO Endo: CBGs controlled on standard scale SSI Insulin requirements in the past 24 hours: none Lytes: all WNL. Mag up 2.1 S/p Mag sulfate 2g IV x 1 yesterday Renal: SCr stable 0.94, UOP 1.6 cc/kg/hr. NS kvo'd Pulm: self extubated 12/9 Cards: VT/VF arrest > cardiogenic shock. Recurrent VF arrest 12/10 and IABP re-inserted. Pressors off. Milrinone at 0.25, Asa, statin, spiro, dig, acei, bb. Heparin for IABP/severe CAD awaiting CABG 12/14 Hepatobil: AST mildly elevated, ALT wnl. Tbili / TG wnl Neuro: severe ETOH abuse hx on CIWA, precedex drip off. Morph prn ID: [email or web address] for aspiration PNA. All cultures negative to date. Afebrile, WBC wnl  TPN Access: PICC placement pending for 12/11 TPN start date:  Nutritional Goals (per RD recommendation on 12/7): KCal: 1875 kcal Protein: 107-142 g Fluid: >/= 2 L  Current Nutrition:  Cardiac diet  Plan:   Update: NG tube out this AM, can swallow - to continue cardiac diet and d/c TPN per Dr. Simmie DaviesBensimhon  Nene Aranas, PharmD, BCPS Clinical Pharmacist Clinical phone for 12/04/2017 until 3:30pm: [phone] If after 3:30pm, please call main pharmacy at: [phone] 12/04/2017 8:00 AM

## 2017-12-04 NOTE — Progress Notes (Signed)
1 Day Post-Op Procedure(s) (LRB): IABP INSERTION (N/A) Subjective: Patient now stable after replacement of intra-aortic balloon pump late yesterday Plan multivessel CABG December 14 Patient has had zero nutrition for week and will need preoperative TPN Albumin is< 3 sinus rhythm Repeat echocardiogram shows ejection fraction 25-30% without MR Objective: Vital signs in last 24 hours: Temp:  [98.2 F (36.8 C)-99.5 F (37.5 C)] 98.2 F (36.8 C) (12/11 1100) Pulse Rate:  [0-158] 67 (12/11 1400) Cardiac Rhythm: Normal sinus rhythm (12/11 1400) Resp:  [0-69] 20 (12/11 1400) BP: (83-179)/(40-104) 117/86 (12/11 1400) SpO2:  [0 %-100 %] 97 % (12/11 1400) Arterial Line BP: (91-161)/(49-81) 91/49 (12/11 1400) FiO2 (%):  [28 %] 28 % (12/11 0748) Weight:  [152 lb 5.4 oz (69.1 kg)] 152 lb 5.4 oz (69.1 kg) (12/11 0500)  Hemodynamic parameters for last 24 hours:  's sinus  Intake/Output from previous day: 12/10 0701 - 12/11 0700 In: 737.1 [I.V.:557.1; NG/GT:30; IV Piggyback:150] Out: 3650 [Urine:2550; Emesis/NG output:1100] Intake/Output this shift: Total I/O In: 599.9 [P.O.:360; I.V.:202.4; IV Piggyback:37.5] Out: 455 [Urine:355; Emesis/NG output:100]        Exam    General- alert and comfortable   Lungs- clear without rales, wheezes   Cor- regular rate and rhythm, no murmur , gallop   Abdomen- soft, non-tender   Extremities - warm, non-tender, minimal edema   Neuro- oriented, appropriate, no focal weakness    Lab Results: Recent Labs    12/03/17 0354 12/04/17 0331  WBC 7.9 9.3  HGB 11.5* 12.1*  HCT 34.0* 36.0*  PLT 122* 178   BMET:  Recent Labs    12/03/17 0354 12/04/17 0331  NA 137 138  K 4.2 4.1  CL 106 106  CO2 23 25  GLUCOSE 105* 108*  BUN 5* 8  CREATININE 0.86 0.94  CALCIUM 8.5* 8.5*    PT/INR: No results for input(s): LABPROT, INR in the last 72 hours. ABG    Component Value Date/Time   PHART 7.385 12/02/2017 0345   HCO3 18.3 (L) 12/02/2017 0345   TCO2 19 (L) 12/02/2017 0345   ACIDBASEDEF 6.0 (H) 12/02/2017 0345   O2SAT 74.0 12/04/2017 0335   CBG (last 3)  Recent Labs    12/04/17 0729 12/04/17 1137 12/04/17 1516  GLUCAP 112* 117* 147*    Assessment/Plan: S/P Procedure(s) (LRB): IABP INSERTION (N/A) Push nutrition Incentive spirometry CABG later this week   LOS: 5 days    Kathlee Nationseter Van Trigt III 12/04/2017

## 2017-12-04 NOTE — Progress Notes (Signed)
Chaplain give emotional support to staff during the code blue--There were no family at bedside. If there are any other emotional and spiritual needs to feel free to page the spiritual care office.

## 2017-12-04 NOTE — Progress Notes (Addendum)
Advanced Heart Failure Progress   Primary Cardiologist:  New to [place] (Dr. Saunders Revel) Primary HF: New (Dr. Haroldine Laws)   HPI:    IABP removed yesterday and he had recurrent VF arrest. IABP replaced.  Remains on IABP 1:1 on milrinone 0.92mg. Norepi weaned off over night  Feels ok. Denies CP or SOB. Confused at times.   Co-ox 74%.  SBP 145 range  Echo reviewed personally. EF ~30% RV normal.   LHC 11/29/17 Left Main  Dist LM lesion 40% stenosed  Left Anterior Descending  Prox LAD-1 lesion is 60% stenosed. The lesion is calcified.  Prox LAD-2 lesion 80% stenosed  First Diagonal Branch  Vessel is small in size.  Ost 1st Diag to 1st Diag lesion 70% stenosed  Second Diagonal Branch  Vessel is small in size. There is mild disease in the vessel.  Left Circumflex  Ost Cx lesion 30% stenosed  Prox Cx lesion is 80% stenosed. The lesion is calcified.  Prox Cx to Mid Cx lesion 80% stenosed  First Obtuse Marginal Branch  Vessel is small in size.  Ost 1st Mrg lesion 90% stenosed  Second Obtuse Marginal Branch  Ost 2nd Mrg to 2nd Mrg lesion 60% stenosed  Right Coronary Artery  Collaterals  Mid RCA filled by collaterals from Prox RCA.    Prox RCA to Mid RCA lesion is 100% stenosed. The lesion is chronically occluded with bridging and left-to-right collateral flow.  Right Posterior Descending Artery  Collaterals  RPDA filled by collaterals from 2nd Sept.     Objective:    Vital Signs:   Temp:  [98.3 F (36.8 C)-100.2 F (37.9 C)] 98.8 F (37.1 C) (12/11 0700) Pulse Rate:  [0-144] 87 (12/11 0700) Resp:  [0-69] 21 (12/11 0700) BP: (83-179)/(40-104) 142/66 (12/11 0700) SpO2:  [0 %-100 %] 100 % (12/11 0748) Arterial Line BP: (111-155)/(50-70) 137/67 (12/11 0700) FiO2 (%):  [28 %] 28 % (12/11 0748) Weight:  [69.1 kg (152 lb 5.4 oz)] 69.1 kg (152 lb 5.4 oz) (12/11 0500) Last BM Date: 12/02/17  Weight change: Filed Weights   12/02/17 0400 12/03/17 0500 12/04/17 0500  Weight:  74.1 kg (163 lb 5.8 oz) 74 kg (163 lb 2.3 oz) 69.1 kg (152 lb 5.4 oz)    Intake/Output:   Intake/Output Summary (Last 24 hours) at 12/04/2017 0754 Last data filed at 12/04/2017 0739 Gross per 24 hour  Intake 787.08 ml  Output 3650 ml  Net -2862.92 ml      Physical Exam    General:  Well appearing. No resp difficulty HEENT: normal x for NGT Neck: supple. no JVD. Carotids 2+ bilat; no bruits. No lymphadenopathy or thryomegaly appreciated. Cor: PMI nondisplaced. Regular rate & rhythm. No rubs, gallops or murmurs. Lungs: clear Abdomen: soft, nontender, nondistended. No hepatosplenomegaly. No bruits or masses. Good bowel sounds. Extremities: no cyanosis, clubbing, rash, edema  L femoral IABP L DP 2+ Neuro: alert & orientedx3, cranial nerves grossly intact. moves all 4 extremities w/o difficulty. Affect pleasant    Telemetry   NSR 90s personally reviewed.    EKG    11/29/17 Sinus tach 135  Labs   Basic Metabolic Panel: Recent Labs  Lab 12/01/17 0457 12/01/17 1805 12/02/17 0333 12/03/17 0052 12/03/17 0354 12/04/17 0331  NA 136  --  139 138 137 138  K 3.5  --  3.5 4.1 4.2 4.1  CL 108  --  112* 107 106 106  CO2 21*  --  20* '25 23 25  ' GLUCOSE  118*  --  134* 105* 105* 108*  BUN 6  --  5* 8 5* 8  CREATININE 1.02  --  0.93 0.98 0.86 0.94  CALCIUM 7.7*  --  8.0* 8.4* 8.5* 8.5*  MG 2.0 1.9 1.8 2.2 1.7 2.1  PHOS 1.7* 2.7 2.5  --  3.7 3.0    Liver Function Tests: Recent Labs  Lab 11/29/17 1638 12/01/17 0457  AST 78* 81*  ALT 37 30  ALKPHOS 45 35*  BILITOT 1.0 0.9  PROT 6.6 5.1*  ALBUMIN 4.1 2.9*   No results for input(s): LIPASE, AMYLASE in the last 168 hours. No results for input(s): AMMONIA in the last 168 hours.  CBC: Recent Labs  Lab 11/29/17 1638  11/30/17 0610 12/01/17 0457 12/02/17 0333 12/03/17 0354 12/04/17 0331  WBC 10.8*  --  16.8* 9.8 6.1 7.9 9.3  NEUTROABS 6.5  --   --   --   --   --   --   HGB 14.3   < > 13.8 12.2* 11.5* 11.5* 12.1*    HCT 42.5   < > 39.7 36.3* 34.7* 34.0* 36.0*  MCV 97.0  --  95.4 94.0 95.6 93.9 94.5  PLT 241  --  240 138* PLATELET CLUMPS NOTED ON SMEAR, COUNT APPEARS ADEQUATE 122* 178   < > = values in this interval not displayed.    Cardiac Enzymes: No results for input(s): CKTOTAL, CKMB, CKMBINDEX, TROPONINI in the last 168 hours.  BNP: BNP (last 3 results) No results for input(s): BNP in the last 8760 hours.  ProBNP (last 3 results) No results for input(s): PROBNP in the last 8760 hours.   CBG: Recent Labs  Lab 12/03/17 1205 12/03/17 1640 12/03/17 2343 12/04/17 0336 12/04/17 0729  GLUCAP 103* 106* 78 104* 112*    Coagulation Studies: No results for input(s): LABPROT, INR in the last 72 hours.  Imaging   Dg Chest 1 View  Result Date: 12/03/2017 CLINICAL DATA:  56 y/o  M; adjustment of intra-aortic balloon pump. EXAM: CHEST 1 VIEW CHEST 1 VIEW COMPARISON:  12/03/2017 chest radiograph FINDINGS: CHEST 1 VIEW, 10:02 p.m. Intra-aortic balloon pump metallic marker projects over the midportion of aortic knob. Transcutaneous pacing pads noted. Stable streaky opacifications in the left lung. Clear right lung. Stable right central venous catheter tip projecting over upper SVC. Enteric tube tip below the field of view. Stable cardiac silhouette. Chronic right-sided rib fractures. CHEST 1 VIEW, 10:00 p.m. Intra-aortic balloon pump metallic marker projects over the lower aspect of aortic knob.Transcutaneous pacing pads noted. Stable streaky opacifications in the left lung. Clear right lung. Stable right central venous catheter tip projecting over upper SVC. Enteric tube tip below the field of view. Stable cardiac silhouette. Chronic right-sided rib fractures. IMPRESSION: 10:00 p.m. chest radiograph aortic balloon pump metallic marker projects over lower aspect of aortic knob. 10:02 p.m. chest radiograph aortic balloon pump metallic marker projects over midportion of aortic knob. Otherwise stable chest  radiograph. Electronically Signed   By: Kristine Garbe M.D.   On: 12/03/2017 22:06   Dg Chest 1 View  Result Date: 12/03/2017 CLINICAL DATA:  56 y/o  M; adjustment of intra-aortic balloon pump. EXAM: CHEST 1 VIEW CHEST 1 VIEW COMPARISON:  12/03/2017 chest radiograph FINDINGS: CHEST 1 VIEW, 10:02 p.m. Intra-aortic balloon pump metallic marker projects over the midportion of aortic knob. Transcutaneous pacing pads noted. Stable streaky opacifications in the left lung. Clear right lung. Stable right central venous catheter tip projecting over upper SVC. Enteric  tube tip below the field of view. Stable cardiac silhouette. Chronic right-sided rib fractures. CHEST 1 VIEW, 10:00 p.m. Intra-aortic balloon pump metallic marker projects over the lower aspect of aortic knob.Transcutaneous pacing pads noted. Stable streaky opacifications in the left lung. Clear right lung. Stable right central venous catheter tip projecting over upper SVC. Enteric tube tip below the field of view. Stable cardiac silhouette. Chronic right-sided rib fractures. IMPRESSION: 10:00 p.m. chest radiograph aortic balloon pump metallic marker projects over lower aspect of aortic knob. 10:02 p.m. chest radiograph aortic balloon pump metallic marker projects over midportion of aortic knob. Otherwise stable chest radiograph. Electronically Signed   By: Kristine Garbe M.D.   On: 12/03/2017 22:06   Dg Chest Port 1 View  Result Date: 12/04/2017 CLINICAL DATA:  Congestive heart failure.  Recent cardiac arrest EXAM: PORTABLE CHEST 1 VIEW COMPARISON:  December 03, 2017 FINDINGS: Intra-aortic balloon pump tip is at the level of T5-6 in the upper descending thoracic aorta. Nasogastric tube tip and side port are below the diaphragm. Cordis tip is in the superior vena cava. No pneumothorax. There is patchy atelectasis in the left mid lower lung zones. There is no frank edema or consolidation. Heart is mildly enlarged with pulmonary  vascularity within normal limits. No adenopathy. There is evidence of an old healed fracture left clavicle. IMPRESSION: Tube and catheter positions as described without pneumothorax. Areas of patchy atelectasis on the left. No edema or consolidation. Stable cardiac prominence. Electronically Signed   By: Lowella Grip III M.D.   On: 12/04/2017 07:22   Dg Chest Port 1 View  Result Date: 12/03/2017 CLINICAL DATA:  On intra aortic balloon pump assist. EXAM: PORTABLE CHEST 1 VIEW COMPARISON:  12/03/2017 at 0142 hours. FINDINGS: Stable cardiomegaly with mild aortic atherosclerosis. Mild central vascular congestion. A gastric tube extends below left hemidiaphragm. The tip and side port of the gastric tube are excluded on this study. There is mild pulmonary vascular congestion. Right IJ central line catheter is noted in the distal SVC. External defibrillator paddles project over the left heart border. IABP is not identified. Chronic left upper posterior fourth and fifth rib fractures with displacement. No acute osseous abnormality. IMPRESSION: Cardiomegaly with mild central vascular congestion. Support lines and tubes as above. Electronically Signed   By: Ashley Royalty M.D.   On: 12/03/2017 21:59     Medications:     Current Medications: . aspirin  81 mg Oral Daily  . atorvastatin  80 mg Oral q1800  . chlorhexidine gluconate (MEDLINE KIT)  15 mL Mouth Rinse BID  . Chlorhexidine Gluconate Cloth  6 each Topical Daily  . digoxin  0.125 mg Oral Daily  . enalapril  2.5 mg Oral BID  . folic acid  1 mg Oral Daily  . Influenza vac split quadrivalent PF  0.5 mL Intramuscular Tomorrow-1000  . insulin aspart  2-6 Units Subcutaneous Q4H  . mouth rinse  15 mL Mouth Rinse BID  . pantoprazole  40 mg Oral QHS  . pneumococcal 23 valent vaccine  0.5 mL Intramuscular Tomorrow-1000  . sodium chloride flush  3 mL Intravenous Q12H  . spironolactone  12.5 mg Oral Daily  . thiamine  100 mg Oral Daily     Infusions: . sodium chloride 10 mL/hr at 12/04/17 0400  . sodium chloride    . ADULT TPN    . dexmedetomidine (PRECEDEX) IV infusion Stopped (12/03/17 0900)  . heparin 1,250 Units/hr (12/03/17 2142)  . milrinone 0.25 mcg/kg/min (12/03/17 2045)  .  norepinephrine (LEVOPHED) Adult infusion Stopped (12/02/17 1205)  . piperacillin-tazobactam (ZOSYN)  IV 3.375 g (12/04/17 0739)    Patient Profile   [name] is a 56 y.o. male with unknown medical history who presented to [facility] 11/29/17 after VT/VF arrest. Intubated emergently in ER. Chest CT with RUL collapse. Pt underwent bronch and taken emergently to cath lab.  Cath showed Severe 3v CAD and elevated LVEDP. IABP placed and started on milrinone with low output CHF.   Assessment/Plan   1. VT/VF Arrest -> Cardiogenic shock - EF 25-30% by echo due to iCM - Had recurrent VF arrest on 12/10 after IABP removed. IABP put back 12/10 - Remains on IABP 1:1 milrinone 0.25 mcg. Co-ox and BP look good.  - Volume status stable.   - Tmax 100.2 last night but AF this am. WBC 9.3 - Would keep IABP in until CABG with critical CAD and recurrent VF - Discussed with Dr. Prescott Gum. Needs CABG ASAP. He will re-evaluate and discuss timing with him today.   2. CAD - Severe 3vD as above - Continue IABP. CO-OX 74%.   - On ASA/statin and heaprin - Discussed with Dr. Prescott Gum. Needs CABG ASAP. He will re-evaluate and discuss timing with him today.    3. 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  - Continue low-dose enalapril - Add low dose carvedilol    4. Acute respiratory failure with RUL collapse and aspiration PNA by CT.  - Self extubated 12/9 . CCM following.  - Continue zosyn  5. Severe ETOH abuse - Continue CIWA  6. Hypokalemia - K 4.1. Stable.   7. F/E/N - taking  clears well. Will consult speech for bedside swallow. Hopefully can pull NGT and feed. Otherwise will start TPN.  - Place PICC  Length of Stay: Morgan, MD  12/04/2017, 7:54 AM  Advanced Heart Failure Team Pager [phone] (M-F; 7a - 4p)  Please contact [facility] for night-coverage after hours (4p -7a ) and weekends on [email or web address]   CRITICAL CARE Performed by: Glori Bickers  Total critical care time: 35 minutes  Critical care time was exclusive of separately billable procedures and treating other patients.  Critical care was necessary to treat or prevent imminent or life-threatening deterioration.  Critical care was time spent personally by me (independent of midlevel providers or residents) on the following activities: development of treatment plan with patient and/or surrogate as well as nursing, discussions with consultants, evaluation of patient's response to treatment, examination of patient, obtaining history from patient or surrogate, ordering and performing treatments and interventions, ordering and review of laboratory studies, ordering and review of radiographic studies, pulse oximetry and re-evaluation of patient's condition.    Glori Bickers, MD  7:54 AM

## 2017-12-04 NOTE — Progress Notes (Signed)
ANTICOAGULATION CONSULT NOTE  Pharmacy Consult:  Heparin Indication: IABP  No Known Allergies  Patient Measurements: Height: 5\' 9"  (175.3 cm) Weight: 163 lb 2.3 oz (74 kg) IBW/kg (Calculated) : 70.7  Heparin dosing weight = 74 kg   Vital Signs: Temp: 98.5 F (36.9 C) (12/10 2344) Temp Source: Oral (12/10 2344) BP: 138/74 (12/11 0400) Pulse Rate: 76 (12/11 0400)  Labs: Recent Labs    12/01/17 0457  12/02/17 0333  12/02/17 1330 12/03/17 0052 12/03/17 0354 12/04/17 0331  HGB 12.2*  --  11.5*  --   --   --  11.5* 12.1*  HCT 36.3*  --  34.7*  --   --   --  34.0* 36.0*  PLT 138*  --  PLATELET CLUMPS NOTED ON SMEAR, COUNT APPEARS ADEQUATE  --   --   --  122* 178  LABPROT 15.3*  --   --   --   --   --   --   --   INR 1.22  --   --   --   --   --   --   --   HEPARINUNFRC  --    < >  --    < > 0.28*  --  0.21* 0.34  CREATININE 1.02  --  0.93  --   --  0.98 0.86 0.94   < > = values in this interval not displayed.    Estimated Creatinine Clearance: 89.8 mL/min (by C-G formula based on SCr of 0.94 mg/dL).    Assessment: [age] YOM s/p cardiac arrest/cath/IABP placement and started on IV heparin.  IABP removed earlier today and IV heparin was transitioned to SQ Lovenox, which the patient received a dose around 1930.  Patient again suffered from cardiac arrest on 12/03/17 and IABP was re-inserted.  Pharmacy consulted to resume IV heparin.   12/11 AM: heparin level therapeutic x 1   Goal of Therapy:  Heparin level 0.2-0.5 units/ml Monitor platelets by anticoagulation protocol: Yes   Plan:  Cont heparin at 1250 units/hr 1200 HL  [name], PharmD, BCPS Clinical Pharmacist Phone: [phone]

## 2017-12-04 NOTE — Progress Notes (Signed)
  Echocardiogram 2D Echocardiogram has been performed.  [name] T [name] 12/04/2017, 9:13 AM

## 2017-12-05 ENCOUNTER — Encounter (HOSPITAL_COMMUNITY): Payer: Self-pay | Admitting: Family Medicine

## 2017-12-05 ENCOUNTER — Inpatient Hospital Stay (HOSPITAL_COMMUNITY): Payer: Medicaid Other

## 2017-12-05 ENCOUNTER — Other Ambulatory Visit (HOSPITAL_COMMUNITY): Payer: Self-pay

## 2017-12-05 ENCOUNTER — Other Ambulatory Visit: Payer: Self-pay

## 2017-12-05 LAB — CBC
HCT: 33.3 % — ABNORMAL LOW (ref 39.0–52.0)
Hemoglobin: 11.5 g/dL — ABNORMAL LOW (ref 13.0–17.0)
MCH: 32.1 pg (ref 26.0–34.0)
MCHC: 34.5 g/dL (ref 30.0–36.0)
MCV: 93 fL (ref 78.0–100.0)
PLATELETS: 176 10*3/uL (ref 150–400)
RBC: 3.58 MIL/uL — AB (ref 4.22–5.81)
RDW: 12.1 % (ref 11.5–15.5)
WBC: 7.8 10*3/uL (ref 4.0–10.5)

## 2017-12-05 LAB — COOXEMETRY PANEL
CARBOXYHEMOGLOBIN: 1 % (ref 0.5–1.5)
Methemoglobin: 1.2 % (ref 0.0–1.5)
O2 SAT: 64.8 %
Total hemoglobin: 15.1 g/dL (ref 12.0–16.0)

## 2017-12-05 LAB — GLUCOSE, CAPILLARY
GLUCOSE-CAPILLARY: 101 mg/dL — AB (ref 65–99)
GLUCOSE-CAPILLARY: 108 mg/dL — AB (ref 65–99)
GLUCOSE-CAPILLARY: 105 mg/dL — AB (ref 65–99)
Glucose-Capillary: 103 mg/dL — ABNORMAL HIGH (ref 65–99)
Glucose-Capillary: 108 mg/dL — ABNORMAL HIGH (ref 65–99)
Glucose-Capillary: 109 mg/dL — ABNORMAL HIGH (ref 65–99)

## 2017-12-05 LAB — HEPATITIS C ANTIBODY: HCV Ab: <0.1 s/co ratio (ref 0.0–0.9)

## 2017-12-05 LAB — COMPREHENSIVE METABOLIC PANEL
ALBUMIN: 2.7 g/dL — AB (ref 3.5–5.0)
ALT: 17 U/L (ref 17–63)
AST: 18 U/L (ref 15–41)
Alkaline Phosphatase: 30 U/L — ABNORMAL LOW (ref 38–126)
Anion gap: 6 (ref 5–15)
BUN: 11 mg/dL (ref 6–20)
CHLORIDE: 107 mmol/L (ref 101–111)
CO2: 26 mmol/L (ref 22–32)
CREATININE: 1 mg/dL (ref 0.61–1.24)
Calcium: 8.4 mg/dL — ABNORMAL LOW (ref 8.9–10.3)
GFR calc Af Amer: >60 mL/min (ref >60)
GFR calc non Af Amer: >60 mL/min (ref >60)
GLUCOSE: 110 mg/dL — AB (ref 65–99)
POTASSIUM: 3.6 mmol/L (ref 3.5–5.1)
SODIUM: 139 mmol/L (ref 135–145)
Total Bilirubin: 0.8 mg/dL (ref 0.3–1.2)
Total Protein: 5.2 g/dL — ABNORMAL LOW (ref 6.5–8.1)

## 2017-12-05 LAB — HIV ANTIBODY (ROUTINE TESTING W REFLEX): HIV SCREEN 4TH GENERATION: NONREACTIVE

## 2017-12-05 LAB — HEPARIN LEVEL (UNFRACTIONATED): HEPARIN UNFRACTIONATED: 0.19 [IU]/mL — AB (ref 0.30–0.70)

## 2017-12-05 LAB — PROTIME-INR
INR: 1.08
Prothrombin Time: 14 seconds (ref 11.4–15.2)

## 2017-12-05 MED ORDER — HEPARIN (PORCINE) IN NACL 100-0.45 UNIT/ML-% IJ SOLN
1400.0000 [IU]/h | INTRAMUSCULAR | Status: DC
  Administered 2017-12-05: 1300 [IU]/h via INTRAVENOUS
  Administered 2017-12-06: 1400 [IU]/h via INTRAVENOUS
  Filled 2017-12-05 (×2): qty 250

## 2017-12-05 MED ORDER — PRO-STAT SUGAR FREE PO LIQD
30.0000 mL | Freq: Two times a day (BID) | ORAL | Status: DC
  Administered 2017-12-05: 30 mL via ORAL
  Filled 2017-12-05 (×3): qty 30

## 2017-12-05 MED ORDER — ENSURE ENLIVE PO LIQD
237.0000 mL | Freq: Three times a day (TID) | ORAL | Status: DC
  Administered 2017-12-09 – 2017-12-13 (×4): 237 mL via ORAL

## 2017-12-05 MED ORDER — DEXMEDETOMIDINE HCL IN NACL 400 MCG/100ML IV SOLN
0.1000 ug/kg/h | INTRAVENOUS | Status: DC
  Administered 2017-12-06: 0.2 ug/kg/h via INTRAVENOUS
  Filled 2017-12-05: qty 100

## 2017-12-05 MED ORDER — ENALAPRIL MALEATE 2.5 MG PO TABS
2.5000 mg | ORAL_TABLET | Freq: Two times a day (BID) | ORAL | Status: DC
  Administered 2017-12-05 – 2017-12-06 (×4): 2.5 mg via ORAL
  Filled 2017-12-05 (×5): qty 1

## 2017-12-05 MED ORDER — SPIRONOLACTONE 25 MG PO TABS
25.0000 mg | ORAL_TABLET | Freq: Every day | ORAL | Status: DC
  Administered 2017-12-05 – 2017-12-06 (×2): 25 mg via ORAL
  Filled 2017-12-05 (×2): qty 1

## 2017-12-05 MED ORDER — ENALAPRIL MALEATE 5 MG PO TABS
5.0000 mg | ORAL_TABLET | Freq: Two times a day (BID) | ORAL | Status: DC
  Filled 2017-12-05: qty 1

## 2017-12-05 MED FILL — Medication: Qty: 1 | Status: AC

## 2017-12-05 NOTE — Progress Notes (Addendum)
Advanced Heart Failure Progress   Primary Cardiologist:  [facility] (Dr. Okey DupreEnd) Primary HF: New (Dr. [name])   HPI:    IABP removed 12/10 and he had recurrent VF arrest. IABP replaced.  Remains on IABP 1:1. On/off low-dose NE. On milrinone 0.25  Passed swallow study but pre-albumin low. Dr. [name] considering TPN. Tentatively schedule CABG for 12/14  Denies CP/SOB. Remains on zosyn for aspiration PNA. Now afebrile.    Echo reviewed personally. EF ~30% RV normal.   LHC 11/29/17 Left Main  Dist LM lesion 40% stenosed  Left Anterior Descending  Prox LAD-1 lesion is 60% stenosed. The lesion is calcified.  Prox LAD-2 lesion 80% stenosed  First Diagonal Branch  Vessel is small in size.  Ost 1st Diag to 1st Diag lesion 70% stenosed  Second Diagonal Branch  Vessel is small in size. There is mild disease in the vessel.  Left Circumflex  Ost Cx lesion 30% stenosed  Prox Cx lesion is 80% stenosed. The lesion is calcified.  Prox Cx to Mid Cx lesion 80% stenosed  First Obtuse Marginal Branch  Vessel is small in size.  Ost 1st Mrg lesion 90% stenosed  Second Obtuse Marginal Branch  Ost 2nd Mrg to 2nd Mrg lesion 60% stenosed  Right Coronary Artery  Collaterals  Mid RCA filled by collaterals from Prox RCA.    Prox RCA to Mid RCA lesion is 100% stenosed. The lesion is chronically occluded with bridging and left-to-right collateral flow.  Right Posterior Descending Artery  Collaterals  RPDA filled by collaterals from 2nd Sept.     Objective:    Vital Signs:   Temp:  [97.6 F (36.4 C)-98.6 F (37 C)] 98 F (36.7 C) (12/12 0800) Pulse Rate:  [57-158] 60 (12/12 0700) Resp:  [15-26] 19 (12/12 0700) BP: (114-179)/(65-117) 143/88 (12/12 0700) SpO2:  [93 %-100 %] 98 % (12/12 0700) Arterial Line BP: (91-161)/(44-81) 111/50 (12/12 0700) Weight:  [69.6 kg (153 lb 7 oz)] 69.6 kg (153 lb 7 oz) (12/12 0500) Last BM Date: 12/05/17  Weight change: Filed Weights   12/03/17  0500 12/04/17 0500 12/05/17 0500  Weight: 74 kg (163 lb 2.3 oz) 69.1 kg (152 lb 5.4 oz) 69.6 kg (153 lb 7 oz)    Intake/Output:   Intake/Output Summary (Last 24 hours) at 12/05/2017 0826 Last data filed at 12/05/2017 0700 Gross per 24 hour  Intake 1579.33 ml  Output 1575 ml  Net 4.33 ml      Physical Exam    General:  Lying in bed NAD  No resp difficulty HEENT: normal Neck: supple. RIJ introducer Carotids 2+ bilat; no bruits. No lymphadenopathy or thryomegaly appreciated. Cor: PMI laterally displaced. Regular rate & rhythm. No rubs, gallops or murmurs. Lungs: clear Abdomen: soft, nontender, nondistended. No hepatosplenomegaly. No bruits or masses. Good bowel sounds. Extremities: no cyanosis, clubbing, rash, edema  LFA IABP L DP 2+  Neuro: alert & orientedx3, cranial nerves grossly intact. moves all 4 extremities w/o difficulty. Affect pleasant   Telemetry   NSR 60-70. Personally reviewed   EKG    11/29/17 Sinus tach 135  Labs   Basic Metabolic Panel: Recent Labs  Lab 12/01/17 0457 12/01/17 1805 12/02/17 0333 12/03/17 0052 12/03/17 0354 12/04/17 0331 12/05/17 0451  NA 136  --  139 138 137 138 139  K 3.5  --  3.5 4.1 4.2 4.1 3.6  CL 108  --  112* 107 106 106 107  CO2 21*  --  20*  25 23 25 26   GLUCOSE 118*  --  134* 105* 105* 108* 110*  BUN 6  --  5* 8 5* 8 11  CREATININE 1.02  --  0.93 0.98 0.86 0.94 1.00  CALCIUM 7.7*  --  8.0* 8.4* 8.5* 8.5* 8.4*  MG 2.0 1.9 1.8 2.2 1.7 2.1  --   PHOS 1.7* 2.7 2.5  --  3.7 3.0  --     Liver Function Tests: Recent Labs  Lab 11/29/17 1638 12/01/17 0457 12/05/17 0451  AST 78* 81* 18  ALT 37 30 17  ALKPHOS 45 35* 30*  BILITOT 1.0 0.9 0.8  PROT 6.6 5.1* 5.2*  ALBUMIN 4.1 2.9* 2.7*   No results for input(s): LIPASE, AMYLASE in the last 168 hours. No results for input(s): AMMONIA in the last 168 hours.  CBC: Recent Labs  Lab 11/29/17 1638  12/01/17 0457 12/02/17 0333 12/03/17 0354 12/04/17 0331 12/05/17 0451   WBC 10.8*   < > 9.8 6.1 7.9 9.3 7.8  NEUTROABS 6.5  --   --   --   --   --   --   HGB 14.3   < > 12.2* 11.5* 11.5* 12.1* 11.5*  HCT 42.5   < > 36.3* 34.7* 34.0* 36.0* 33.3*  MCV 97.0   < > 94.0 95.6 93.9 94.5 93.0  PLT 241   < > 138* PLATELET CLUMPS NOTED ON SMEAR, COUNT APPEARS ADEQUATE 122* 178 176   < > = values in this interval not displayed.    Cardiac Enzymes: No results for input(s): CKTOTAL, CKMB, CKMBINDEX, TROPONINI in the last 168 hours.  BNP: BNP (last 3 results) No results for input(s): BNP in the last 8760 hours.  ProBNP (last 3 results) No results for input(s): PROBNP in the last 8760 hours.   CBG: Recent Labs  Lab 12/04/17 1516 12/04/17 1956 12/04/17 2325 12/05/17 0453 12/05/17 0809  GLUCAP 147* 139* 114* 101* 103*    Coagulation Studies: Recent Labs    12/05/17 0451  LABPROT 14.0  INR 1.08    Imaging   No results found.   Medications:     Current Medications: . aspirin  81 mg Oral Daily  . atorvastatin  80 mg Oral q1800  . Chlorhexidine Gluconate Cloth  6 each Topical Daily  . Chlorhexidine Gluconate Cloth  6 each Topical Daily  . digoxin  0.125 mg Oral Daily  . enalapril  2.5 mg Oral BID  . feeding supplement (ENSURE ENLIVE)  237 mL Oral TID WC  . feeding supplement (PRO-STAT SUGAR FREE 64)  30 mL Oral BID  . folic acid  1 mg Oral Daily  . insulin aspart  2-6 Units Subcutaneous Q4H  . mouth rinse  15 mL Mouth Rinse BID  . pantoprazole  40 mg Oral QHS  . sodium chloride flush  10-40 mL Intracatheter Q12H  . sodium chloride flush  3 mL Intravenous Q12H  . spironolactone  12.5 mg Oral Daily  . thiamine  100 mg Oral Daily    Infusions: . sodium chloride 1,000 mL (12/04/17 1313)  . sodium chloride    . dexmedetomidine (PRECEDEX) IV infusion 0.4 mcg/kg/hr (12/05/17 4540)  . heparin    . milrinone 0.25 mcg/kg/min (12/04/17 1000)  . norepinephrine (LEVOPHED) Adult infusion 1 mcg/min (12/05/17 0610)  . piperacillin-tazobactam (ZOSYN)   IV Stopped (12/05/17 0322)    Patient Profile   [name] is a 56 y.o. male with unknown medical history who presented to [facility]  after VT/VF arrest. Intubated emergently in ER. Chest CT with RUL collapse. Pt underwent bronch and taken emergently to cath lab.  Cath showed Severe 3v CAD and elevated LVEDP. IABP placed and started on milrinone with low output CHF.   On 12/10 IABP removed and had recurrent VF. IABP replaced.  Assessment/Plan   1. VT/VF Arrest -> Cardiogenic shock - EF 25-30% by echo due to iCM - Had recurrent VF arrest on 12/10 after IABP removed. IABP put back 12/10 - Remains on IABP 1:1 milrinone 0.25 mcg. Hemodynamics marginal but improved with IABP and pressor support.  - Tenatively scheduled for CABG 12/14 but nutritional status low. Possible start TPN has passed swallow study. Encouraged po intake - Volume status stable.   - Would keep IABP in until CABG with critical CAD and recurrent VF - Discussed with Dr. [name]. Possible CABG 12/14 - IABP looks lox on CXR freom yesterday will repeat and reposition as needed   2. CAD - Severe 3vD as above - Continue IABP. CO-OX 65%.   - On ASA/statin and heaprin - Discussed with Dr. [name]. Possible CABG 12/14   3. Acute systolic CHF due to ICM - EF 25-30% - Continue milrinone at 0.25 - Increaes spiro to 25 - Continue digoxin 0.125 mg daily.  - Increase enalapril  - Consider low-dose b-blocker soon  4. Acute respiratory failure with RUL collapse and aspiration PNA by CT.  - Self extubated 12/9 .  - Much improved. Now afebrile. CXR clearing (Personally reviewed today)  5. Severe ETOH abuse - Continue CIWA  6. Hypokalemia - will supp d/w PharmD. Increase spiro 25 daly  7. F/E/N - passed swallow. Encouraged po intake. Possible TPN per Dr. [name]  Length of Stay: 6  Arvilla Meresaniel Donette Mainwaring, MD  12/05/2017, 8:26 AM  Advanced Heart Failure Team Pager [phone] (M-F; 7a - 4p)  Please contact  [facility] for night-coverage after hours (4p -7a ) and weekends on [email or web address]   CRITICAL CARE Performed by: Arvilla MeresBensimhon, Malasia Torain  Total critical care time: 35 minutes  Critical care time was exclusive of separately billable procedures and treating other patients.  Critical care was necessary to treat or prevent imminent or life-threatening deterioration.  Critical care was time spent personally by me (independent of midlevel providers or residents) on the following activities: development of treatment plan with patient and/or surrogate as well as nursing, discussions with consultants, evaluation of patient's response to treatment, examination of patient, obtaining history from patient or surrogate, ordering and performing treatments and interventions, ordering and review of laboratory studies, ordering and review of radiographic studies, pulse oximetry and re-evaluation of patient's condition.  Arvilla Meresaniel Kayzen Kendzierski, MD  8:47 AM

## 2017-12-05 NOTE — Progress Notes (Signed)
Visiting with patient in regards to Advanced Directives. Patient is trying to reach an uncle, Madaline Savagehomas Harvey Shelar, whom he would like to talk to about being his healthcare agent.  Patient and I attempted to call his sister to get the uncle's phone number.  Will continue to work on in hopes of getting AD completed prior to bypass surgery.  Chaplain happy to assist this patient.  Will continue follow up.  Please page or consult as needed.    12/05/17 1431  Clinical Encounter Type  Visited With Patient;Health care provider  Visit Type Initial;Spiritual support  Spiritual Encounters  Spiritual Needs Literature (Advanced Directives)

## 2017-12-05 NOTE — Progress Notes (Signed)
Nutrition Follow-up  DOCUMENTATION CODES:   Not applicable  INTERVENTION:   Ensure Enlive po TID, each supplement provides 350 kcal and 20 grams of protein  Pro-Stat 30 mL BID, each packet providing 100 kcals, 15 grams of protein  Based upon nutrition assessment, pt does not appear to require initiation of nutrition support at this time. Diet advanced and pt tolerating 50-75% of meals and taking oral nutrition supplements. Pt did receive Tube Feeding @ goal rate while on vent from 12/07 to 12/09 when pt self-extubated. Pt not malnourished upon exam based on ASPEN/AND clinical characteristics for diagnosing malnutrition  NUTRITION DIAGNOSIS:   Inadequate oral intake related to acute illness as evidenced by NPO status.  Improving as diet advanced, pt eating 50-75% of meals, taking supplements  GOAL:   Patient will meet greater than or equal to 90% of their needs  Progressing  MONITOR:   PO intake, Supplement acceptance, Labs, Weight trends, I & O's  REASON FOR ASSESSMENT:   Consult Assessment of nutrition requirement/status  ASSESSMENT:   56 yo male with no known PMH admitted after he collapsed chopping firewood. Pt with VF arrest, VDRF post arrest requiring intubation, RUL collapse   12/9 Self-Extubated 12/10 IABP removed and had recurrent VF arrest, IABP replaced  Noted plan for CABG later this week 12/14  SLP evaluated, diet advanced to Heart Healthy on 12/10 Pt ate 75% at breakfast this AM. Recorded po intake 50-75% of meals since diet advanced. Pt taking Pro-Stat supplements. Pt reports he has not tried Ensure yet but planning on drinking one this AM. Pt agreeable to drinking Ensure to optimize nutritional status prior to surgery  Pt did receive some TF while intubated. TF started @ goal rate on 12/7 and infused until 12/9 when pt self-extubated.  Prior to admission, pt reports good appetite, eating 3 meals per day. Pt reports she eats egg, bologna and cheese  sandwich for breakfast, bologna and cheese sandwich for lunch and meat (chicken, pork, beef) with sides for dinner. Pt would likely benefit from heart healthy diet education prior to discharge but not currently appropriate  Pt unsure if he lost weight recently; reports UBW around 160 pounds. Current wt of 153 pounds. Pt weighed 156 pounds on 12/7, 160 on 12/08, 163 on 12/10, 152 pounds on 12/11. Pt is net negative 1.5 L per I/O flowsheet  NUTRITION - FOCUSED PHYSICAL EXAM:    Most Recent Value  Orbital Region  No depletion  Upper Arm Region  No depletion  Thoracic and Lumbar Region  No depletion  Buccal Region  No depletion  Temple Region  No depletion  Clavicle Bone Region  No depletion  Clavicle and Acromion Bone Region  No depletion  Scapular Bone Region  No depletion  Dorsal Hand  No depletion  Patellar Region  No depletion  Anterior Thigh Region  No depletion  Posterior Calf Region  No depletion  Edema (RD Assessment)  Mild  Hair  Reviewed  Eyes  Reviewed  Mouth  Reviewed  Skin  Reviewed  Nails  Reviewed       Diet Order:  Diet Heart Room service appropriate? Yes; Fluid consistency: Thin  EDUCATION NEEDS:   Education needs have been addressed  Skin:  Skin Assessment: Reviewed RN Assessment  Last BM:  12/12  Height:   Ht Readings from Last 1 Encounters:  11/30/17 5\' 9"  (1.753 m)    Weight:   Wt Readings from Last 1 Encounters:  12/05/17 153 lb 7 oz (69.6  kg)    Ideal Body Weight:  72.7 kg  BMI:  Body mass index is 22.66 kg/m.  Estimated Nutritional Needs:   Kcal:  2100-2400 kcals  Protein:  105-120 g  Fluid:  >/= 1.7 L   Romelle Starcherate Brace Welte MS, RD, LDN, CNSC [phone] Pager  [phone] Weekend/On-Call Pager

## 2017-12-05 NOTE — Progress Notes (Signed)
  Speech Language Pathology Treatment: Dysphagia  Patient Details Name: [name] MRN: [id number] DOB: 05/24/1961 Today's Date: 12/05/2017 Time: [id number] SLP Time Calculation (min) (ACUTE ONLY): 11 min  Assessment / Plan / Recommendation Clinical Impression  Positioning during skilled observation limited due to his balloon pump and inability to have head of bed above 20 degrees is ideal per RN (maybe 30). Pt placed in reverse Trendelenburg position, folded pill under head head and verbal cues to lift head off pillow and hold head in neutral position. No s/s penetration or aspiration with regular texture or thin via straw. Recommend continue with pillows behind head and head in neutral. Continue regular/ thin. Pt is having a CABG Fri, therefore ST will keep him on caseload and see when able to initiate po's.    HPI HPI: Pt is a 56 y.o.maleadmitted after cardiac arrest while chopping wood. Bystander CRP (5-6 mion) initiated within one minute PR. Intubated 12/6-12/9 self extubation. Found to have asp pna. PMH: severe CAD, severe ETOH abuse. CXR 12/11 Areas of patchy atelectasis on the left. No edema or consolidation. Stable cardiac prominence.      SLP Plan  Continue with current plan of care       Recommendations  Diet recommendations: Regular;Thin liquid Liquids provided via: Cup;Straw Medication Administration: Whole meds with liquid Supervision: Patient able to self feed Compensations: Slow rate;Small sips/bites;Minimize environmental distractions Postural Changes and/or Swallow Maneuvers: Seated upright 90 degrees                Oral Care Recommendations: Oral care BID Follow up Recommendations: Other (comment)(TBD) SLP Visit Diagnosis: Dysphagia, unspecified (R13.10) Plan: Continue with current plan of care                       [name], [name] 12/05/2017, 11:27 AM  [name] M.Ed [employer] Pager [phone]

## 2017-12-05 NOTE — Progress Notes (Signed)
Bedside swallow eval   12/04/17 0909  SLP Visit Information  SLP Received On 12/04/17  General Information  HPI Pt is a 56 y.o.maleadmitted after cardiac arrest while chopping wood. Bystander CRP (5-6 mion) initiated within one minute PR. Intubated 12/6-12/9 self extubation. Found to have asp pna. PMH: severe CAD, severe ETOH abuse. CXR 12/11 Areas of patchy atelectasis on the left. No edema or consolidation. Stable cardiac prominence.  Type of Study Bedside Swallow Evaluation  Previous Swallow Assessment (none)  Diet Prior to this Study Regular;Thin liquids (nurses were witholding tray until BSE)  Temperature Spikes Noted No  Respiratory Status Nasal cannula  History of Recent Intubation Yes  Length of Intubations (days) 4 days  Date extubated 12/02/17  Behavior/Cognition Alert;Cooperative;Pleasant mood;Requires cueing;Distractible  Oral Cavity Assessment WFL  Oral Care Completed by SLP No  Oral Cavity - Dentition Adequate natural dentition  Vision Functional for self-feeding  Self-Feeding Abilities Able to feed self  Patient Positioning Upright in bed  Baseline Vocal Quality Hoarse  Volitional Cough Strong  Volitional Swallow Able to elicit  Pain Assessment  Pain Assessment Faces  Faces Pain Scale 4  Pain Intervention(s) Monitored during session  Oral Assessment (Complete on admission/transfer/change in patient condition)  Does patient have any of the following "high risk" factors? Nutritional status - fluids only or NPO for >24 hours  Does patient have any of the following "at risk" factors? Oxygen therapy - cannula, mask, simple oxygen devices  Patient is AT RISK Order set for Adult Oral Care Standing Orders initiated -  "At Risk Patients" option selected (see row information)  Oral Motor/Sensory Function  Overall Oral Motor/Sensory Function WFL  Ice Chips  Ice chips NT  Thin Liquid  Thin Liquid Impaired  Presentation Straw;Cup  Pharyngeal  Phase Impairments Wet Vocal  Quality  Nectar Thick Liquid  Nectar Thick Liquid NT  Honey Thick Liquid  Honey Thick Liquid NT  Puree  Puree WFL  Solid  Solid WFL  SLP - End of Session  Patient left in bed;with nursing in room  Nurse Communication Diet recommendation;Swallow strategies reviewed  SLP Assessment  Clinical Impression Statement (ACUTE ONLY) Vocal quality mildly hoarse s/p 4 day intubation with adequate intensity. Cough is strong, pt alert following directions, requiring cues for verbosity and tangentiality and attention to task. Inconsistent subtle wet vocal quality noted not directly related to one consistency po. Educated pt to sit upright, take small sips, not verbalize immediately after swallow. ST will continue to follow with recommended texture of regular and thin liquids, straw sips allowed and pills with liquids.   SLP Visit Diagnosis Dysphagia, unspecified (R13.10)  Impact on safety and function Mild aspiration risk  Swallow Evaluation Recommendations  SLP Diet Recommendations Regular;Thin liquid  Liquid Administration via Cup;Straw  Medication Administration Whole meds with liquid  Supervision Patient able to self feed;Intermittent supervision to cue for compensatory strategies  Compensations Slow rate;Small sips/bites;Minimize environmental distractions  Postural Changes Seated upright at 90 degrees  Treatment Plan  Oral Care Recommendations Oral care BID  Treatment Recommendations Therapy as outlined in treatment plan below  Follow up Recommendations None  Speech Therapy Frequency (ACUTE ONLY) min 1 x/week  Treatment Duration 2 weeks  Interventions Aspiration precaution training;Compensatory techniques;Patient/family education;Diet toleration management by SLP  Prognosis  Prognosis for Safe Diet Advancement Good  Individuals Consulted  Consulted and Agree with Results and Recommendations Patient  SLP Time Calculation  SLP Start Time (ACUTE ONLY) 0909  SLP Stop Time (ACUTE ONLY) [id number]  SLP Time Calculation (min) (ACUTE ONLY) 14 min  SLP Evaluations  $ SLP Speech Visit 1 Visit  SLP Evaluations  $BSS Swallow 1 Procedure   Breck CoonsLisa Willis Woodbury CenterLitaker M.Ed [employer] Pager [phone]

## 2017-12-05 NOTE — Plan of Care (Deleted)
Patient eating meals and nutritional supplements.  Patient voiding and having regular BMs.

## 2017-12-05 NOTE — Plan of Care (Signed)
Pt does seem to be progressing toward surgery.  Pt has been slightly anxious but precedex does seem to help. He does need frequent reeducation and frequent reminders about his present illness.

## 2017-12-05 NOTE — Progress Notes (Signed)
Pharmacist re: - Patient Medication    [name] is an 56 y.o. male who [facility] with a chief complaint of  Chief Complaint  Patient presents with  . Cardiac Arrest     Our medication history team has been unable to locate family members, determine current medication regimens for this patient.  He apparently [facility] but has had nothing filled in the last 90 days.  Patient has been unable to provide information as well.  We will mark his record as medications unknown - please let pharmacy know should new information become available and we will be happy to update his medication list.  Nadara MustardNita Zaydee Aina, PharmD., MS Clinical Pharmacist Pager:  [phone] Thank you for allowing pharmacy to be part of this patients care team.  12/05/2017, 8:33 AM

## 2017-12-05 NOTE — Progress Notes (Signed)
ANTICOAGULATION CONSULT NOTE - Follow Up Consult  Pharmacy Consult:  Heparin Indication: IABP  No Known Allergies  Patient Measurements: Height: 5\' 9"  (175.3 cm) Weight: 153 lb 7 oz (69.6 kg) IBW/kg (Calculated) : 70.7  Heparin dosing weight = 74 kg   Vital Signs: Temp: 97.8 F (36.6 C) (12/12 1200) Temp Source: Oral (12/12 1200) BP: 147/80 (12/12 1200) Pulse Rate: 75 (12/12 1200)  Labs: Recent Labs    12/03/17 0354 12/04/17 0331 12/04/17 1105 12/05/17 0451  HGB 11.5* 12.1*  --  11.5*  HCT 34.0* 36.0*  --  33.3*  PLT 122* 178  --  176  LABPROT  --   --   --  14.0  INR  --   --   --  1.08  HEPARINUNFRC 0.21* 0.34 0.21* 0.19*  CREATININE 0.86 0.94  --  1.00    Estimated Creatinine Clearance: 83.1 mL/min (by C-G formula based on SCr of 1 mg/dL).  Medications: Heparin @ 1250 units/hr  Assessment: 56 YOM s/p cardiac arrest/cath/IABP placement and started on IV heparin.  IABP removed and IV heparin was transitioned to SQ Lovenox. Patient again suffered from cardiac arrest on 12/03/17 and IABP was re-inserted.  Pharmacy consulted to resume IV heparin.  Noted that patient received 5000 units bolus in the cath lab.    Heparin level is slightly below goal at 0.19. CBC ok. No bleeding.  Goal of Therapy:  Heparin level 0.2-0.5 units/ml Monitor platelets by anticoagulation protocol: Yes    Plan:  1) Increase heparin to 1300 units/hr 2) Daily heparin level and CBC  Louie CasaJennifer Dovey Fatzinger, PharmD, BCPS 12/05/2017 1:36 PM

## 2017-12-06 ENCOUNTER — Inpatient Hospital Stay (HOSPITAL_COMMUNITY): Payer: Medicaid Other

## 2017-12-06 ENCOUNTER — Encounter (HOSPITAL_COMMUNITY): Payer: Self-pay

## 2017-12-06 ENCOUNTER — Encounter (HOSPITAL_COMMUNITY): Payer: Self-pay | Admitting: Certified Registered Nurse Anesthetist

## 2017-12-06 DIAGNOSIS — Z01810 Encounter for preprocedural cardiovascular examination: Secondary | ICD-10-CM

## 2017-12-06 LAB — CBC
HEMATOCRIT: 33.6 % — AB (ref 39.0–52.0)
HEMOGLOBIN: 11.6 g/dL — AB (ref 13.0–17.0)
MCH: 31.9 pg (ref 26.0–34.0)
MCHC: 34.5 g/dL (ref 30.0–36.0)
MCV: 92.3 fL (ref 78.0–100.0)
Platelets: 202 10*3/uL (ref 150–400)
RBC: 3.64 MIL/uL — AB (ref 4.22–5.81)
RDW: 12.1 % (ref 11.5–15.5)
WBC: 7.4 10*3/uL (ref 4.0–10.5)

## 2017-12-06 LAB — POCT I-STAT 3, ART BLOOD GAS (G3+)
ACID-BASE DEFICIT: 3 mmol/L — AB (ref 0.0–2.0)
BICARBONATE: 21.5 mmol/L (ref 20.0–28.0)
O2 SAT: 94 %
PCO2 ART: 35.5 mmHg (ref 32.0–48.0)
PO2 ART: 70 mmHg — AB (ref 83.0–108.0)
Patient temperature: 98.6
TCO2: 23 mmol/L (ref 22–32)
pH, Arterial: 7.39 (ref 7.350–7.450)

## 2017-12-06 LAB — GLUCOSE, CAPILLARY
GLUCOSE-CAPILLARY: 87 mg/dL (ref 65–99)
GLUCOSE-CAPILLARY: 103 mg/dL — AB (ref 65–99)
GLUCOSE-CAPILLARY: 102 mg/dL — AB (ref 65–99)
Glucose-Capillary: 111 mg/dL — ABNORMAL HIGH (ref 65–99)
Glucose-Capillary: 119 mg/dL — ABNORMAL HIGH (ref 65–99)

## 2017-12-06 LAB — APTT: APTT: 56 s — AB (ref 24–36)

## 2017-12-06 LAB — COMPREHENSIVE METABOLIC PANEL
ALT: 22 U/L (ref 17–63)
AST: 22 U/L (ref 15–41)
Albumin: 2.8 g/dL — ABNORMAL LOW (ref 3.5–5.0)
Alkaline Phosphatase: 32 U/L — ABNORMAL LOW (ref 38–126)
Anion gap: 7 (ref 5–15)
BUN: 13 mg/dL (ref 6–20)
CO2: 25 mmol/L (ref 22–32)
Calcium: 8.6 mg/dL — ABNORMAL LOW (ref 8.9–10.3)
Chloride: 105 mmol/L (ref 101–111)
Creatinine, Ser: 1.04 mg/dL (ref 0.61–1.24)
GFR calc Af Amer: >60 mL/min (ref >60)
GFR calc non Af Amer: >60 mL/min (ref >60)
Glucose, Bld: 124 mg/dL — ABNORMAL HIGH (ref 65–99)
Potassium: 3.6 mmol/L (ref 3.5–5.1)
Sodium: 137 mmol/L (ref 135–145)
Total Bilirubin: 0.6 mg/dL (ref 0.3–1.2)
Total Protein: 5.6 g/dL — ABNORMAL LOW (ref 6.5–8.1)

## 2017-12-06 LAB — HEMOGLOBIN A1C
Hgb A1c MFr Bld: 5 % (ref 4.8–5.6)
Mean Plasma Glucose: 96.8 mg/dL

## 2017-12-06 LAB — PULMONARY FUNCTION TEST
FEF 25-75 Pre: 1.77 L/sec
FEF2575-%Pred-Pre: 53 %
FEV1-%Pred-Pre: 52 %
FEV1-Pre: 2.03 L
FEV1FVC-%Pred-Pre: 90 %
FEV6-%Pred-Pre: 60 %
FEV6-Pre: 2.93 L
FEV6FVC-%Pred-Pre: 104 %
FVC-%Pred-Pre: 58 %
FVC-Pre: 2.93 L
Pre FEV1/FVC ratio: 69 %
Pre FEV6/FVC Ratio: 100 %

## 2017-12-06 LAB — PREPARE RBC (CROSSMATCH)

## 2017-12-06 LAB — DIGOXIN LEVEL

## 2017-12-06 LAB — HEPARIN LEVEL (UNFRACTIONATED)
Heparin Unfractionated: 0.19 IU/mL — ABNORMAL LOW (ref 0.30–0.70)
Heparin Unfractionated: 0.19 IU/mL — ABNORMAL LOW (ref 0.30–0.70)

## 2017-12-06 LAB — COOXEMETRY PANEL
CARBOXYHEMOGLOBIN: 1.1 % (ref 0.5–1.5)
Methemoglobin: 1.2 % (ref 0.0–1.5)
O2 Saturation: 65.7 %
Total hemoglobin: 11.9 g/dL — ABNORMAL LOW (ref 12.0–16.0)

## 2017-12-06 LAB — ABO/RH: ABO/RH(D): A POS

## 2017-12-06 MED ORDER — DIAZEPAM 5 MG PO TABS
5.0000 mg | ORAL_TABLET | Freq: Once | ORAL | Status: AC
  Administered 2017-12-07: 5 mg via ORAL
  Filled 2017-12-06: qty 1

## 2017-12-06 MED ORDER — TRANEXAMIC ACID 1000 MG/10ML IV SOLN
1.5000 mg/kg/h | INTRAVENOUS | Status: AC
  Administered 2017-12-07: 1.5 mg/kg/h via INTRAVENOUS
  Filled 2017-12-06: qty 25

## 2017-12-06 MED ORDER — DOPAMINE-DEXTROSE 3.2-5 MG/ML-% IV SOLN
0.0000 ug/kg/min | INTRAVENOUS | Status: DC
  Filled 2017-12-06: qty 250

## 2017-12-06 MED ORDER — CHLORHEXIDINE GLUCONATE 0.12 % MT SOLN
15.0000 mL | Freq: Once | OROMUCOSAL | Status: AC
  Administered 2017-12-07: 15 mL via OROMUCOSAL
  Filled 2017-12-06: qty 15

## 2017-12-06 MED ORDER — TEMAZEPAM 15 MG PO CAPS: 15.0000 mg | ORAL_CAPSULE | Freq: Once | ORAL | Status: DC | PRN

## 2017-12-06 MED ORDER — SODIUM CHLORIDE 0.9 % IV SOLN
INTRAVENOUS | Status: AC
  Administered 2017-12-07: .8 [IU]/h via INTRAVENOUS
  Filled 2017-12-06: qty 1

## 2017-12-06 MED ORDER — TRANEXAMIC ACID (OHS) PUMP PRIME SOLUTION
2.0000 mg/kg | INTRAVENOUS | Status: DC
  Filled 2017-12-06: qty 1.41

## 2017-12-06 MED ORDER — SODIUM CHLORIDE 0.9 % IV SOLN
30.0000 ug/min | INTRAVENOUS | Status: DC
  Filled 2017-12-06: qty 2

## 2017-12-06 MED ORDER — MILRINONE LACTATE IN DEXTROSE 20-5 MG/100ML-% IV SOLN
0.1250 ug/kg/min | INTRAVENOUS | Status: DC
  Filled 2017-12-06: qty 100

## 2017-12-06 MED ORDER — PLASMA-LYTE 148 IV SOLN
INTRAVENOUS | Status: DC
  Filled 2017-12-06 (×2): qty 2.5

## 2017-12-06 MED ORDER — TEMAZEPAM 15 MG PO CAPS
15.0000 mg | ORAL_CAPSULE | Freq: Once | ORAL | Status: AC | PRN
  Administered 2017-12-06: 15 mg via ORAL
  Filled 2017-12-06: qty 1

## 2017-12-06 MED ORDER — METOPROLOL TARTRATE 12.5 MG HALF TABLET
12.5000 mg | ORAL_TABLET | Freq: Once | ORAL | Status: AC
  Administered 2017-12-07: 12.5 mg via ORAL
  Filled 2017-12-06: qty 1

## 2017-12-06 MED ORDER — CHLORHEXIDINE GLUCONATE CLOTH 2 % EX PADS
6.0000 | MEDICATED_PAD | Freq: Once | CUTANEOUS | Status: AC
  Administered 2017-12-06: 6 via TOPICAL

## 2017-12-06 MED ORDER — BISACODYL 5 MG PO TBEC: 5.0000 mg | DELAYED_RELEASE_TABLET | Freq: Once | ORAL | Status: DC

## 2017-12-06 MED ORDER — TRANEXAMIC ACID (OHS) BOLUS VIA INFUSION
15.0000 mg/kg | INTRAVENOUS | Status: AC
  Administered 2017-12-07: 1054.5 mg via INTRAVENOUS
  Filled 2017-12-06: qty 1055

## 2017-12-06 MED ORDER — CHLORHEXIDINE GLUCONATE CLOTH 2 % EX PADS: 6.0000 | MEDICATED_PAD | Freq: Once | CUTANEOUS | Status: DC

## 2017-12-06 MED ORDER — DEXMEDETOMIDINE HCL IN NACL 400 MCG/100ML IV SOLN
0.1000 ug/kg/h | INTRAVENOUS | Status: DC
  Filled 2017-12-06: qty 100

## 2017-12-06 MED ORDER — BISACODYL 5 MG PO TBEC
5.0000 mg | DELAYED_RELEASE_TABLET | Freq: Once | ORAL | Status: DC
  Filled 2017-12-06: qty 1

## 2017-12-06 MED ORDER — DEXTROSE 5 % IV SOLN
750.0000 mg | INTRAVENOUS | Status: AC
  Administered 2017-12-07: 750 mg via INTRAVENOUS
  Filled 2017-12-06: qty 750

## 2017-12-06 MED ORDER — CHLORHEXIDINE GLUCONATE 0.12 % MT SOLN: 15.0000 mL | Freq: Once | OROMUCOSAL | Status: DC

## 2017-12-06 MED ORDER — SODIUM CHLORIDE 0.9 % IV SOLN
INTRAVENOUS | Status: DC
  Filled 2017-12-06: qty 30

## 2017-12-06 MED ORDER — HEPARIN (PORCINE) IN NACL 100-0.45 UNIT/ML-% IJ SOLN
1550.0000 [IU]/h | INTRAMUSCULAR | Status: DC
  Administered 2017-12-06: 1550 [IU]/h via INTRAVENOUS
  Filled 2017-12-06: qty 250

## 2017-12-06 MED ORDER — POTASSIUM CHLORIDE 2 MEQ/ML IV SOLN
80.0000 meq | INTRAVENOUS | Status: DC
  Filled 2017-12-06: qty 40

## 2017-12-06 MED ORDER — NITROGLYCERIN IN D5W 200-5 MCG/ML-% IV SOLN
2.0000 ug/min | INTRAVENOUS | Status: AC
  Administered 2017-12-07: 16.67 ug/min via INTRAVENOUS
  Filled 2017-12-06: qty 250

## 2017-12-06 MED ORDER — VANCOMYCIN HCL 10 G IV SOLR
1250.0000 mg | INTRAVENOUS | Status: AC
  Administered 2017-12-07: 1250 mg via INTRAVENOUS
  Filled 2017-12-06: qty 1250

## 2017-12-06 MED ORDER — EPINEPHRINE PF 1 MG/ML IJ SOLN
0.0000 ug/min | INTRAMUSCULAR | Status: AC
  Administered 2017-12-07: 2 ug/min via INTRAVENOUS
  Filled 2017-12-06: qty 4

## 2017-12-06 MED ORDER — CHLORHEXIDINE GLUCONATE CLOTH 2 % EX PADS
6.0000 | MEDICATED_PAD | Freq: Once | CUTANEOUS | Status: AC
  Administered 2017-12-07: 6 via TOPICAL

## 2017-12-06 MED ORDER — CHLORHEXIDINE GLUCONATE CLOTH 2 % EX PADS: 6.0000 | MEDICATED_PAD | Freq: Every day | CUTANEOUS | Status: DC

## 2017-12-06 MED ORDER — MAGNESIUM SULFATE 50 % IJ SOLN
40.0000 meq | INTRAMUSCULAR | Status: DC
Start: 2017-12-07 — End: 2017-12-07
  Filled 2017-12-06: qty 9.85

## 2017-12-06 MED ORDER — DEXTROSE 5 % IV SOLN
1.5000 g | INTRAVENOUS | Status: AC
  Administered 2017-12-07: 1.5 g via INTRAVENOUS
  Filled 2017-12-06: qty 1.5

## 2017-12-06 NOTE — Progress Notes (Addendum)
CARDIAC REHAB PHASE I   Pt on bed rest, IABP in place. Cardiac surgery pre-op education completed. Reviewed IS, sternal precautions, activity progression, cardiac surgery booklet and cardiac surgery guidelines. Pt verbalized understanding, declined cardiac surgery vidoes. Pt in bed, call bell within reach. Will follow post-op.   [id number] Joylene GrapesEmily C Chai Verdejo, RN, BSN 12/06/2017 11:46 AM

## 2017-12-06 NOTE — Progress Notes (Signed)
Pt refusing to watch pre-op open heart surgery video at this time. Will continue to offer.

## 2017-12-06 NOTE — Progress Notes (Signed)
ANTICOAGULATION CONSULT NOTE - Follow Up Consult  Pharmacy Consult for heparin Indication: IABP  Labs: Recent Labs    12/04/17 0331 12/04/17 1105 12/05/17 0451 12/06/17 0503  HGB 12.1*  --  11.5* 11.6*  HCT 36.0*  --  33.3* 33.6*  PLT 178  --  176 202  LABPROT  --   --  14.0  --   INR  --   --  1.08  --   HEPARINUNFRC 0.34 0.21* 0.19* 0.19*  CREATININE 0.94  --  1.00  --     Assessment: 56yo male remains subtherapeutic on heparin with no change in level despite rate increase.  Goal of Therapy:  Heparin level 0.2-0.5 units/ml   Plan:  Will increase heparin gtt by ~10% to 1400 units/hr and check level in 6hr.  Vernard GamblesVeronda Zeba Luby, PharmD, BCPS  12/06/2017,6:22 AM

## 2017-12-06 NOTE — Progress Notes (Signed)
Pre-op Cardiac Surgery  Carotid Findings:Mild mixed plaque throughout the bilateral common carotid arteries and mild to moderate mixed plaque at the bifurcation.  Vertebral artery flow is antegrade.  Patient on balloon pump.  Upper Extremity Right Left  Brachial Pressures 115T T  Radial Waveforms T T  Ulnar Waveforms T T  Palmar Arch (Allen's Test) WNL WNL   Findings:      Lower  Extremity Right Left  Dorsalis Pedis    Anterior Tibial T T  Posterior Tibial T T  Ankle/Brachial Indices      Findings:

## 2017-12-06 NOTE — Progress Notes (Signed)
Completed Advanced Directives and placed a copy on the chart.  Original copy given to patient.    12/06/17 1804  Clinical Encounter Type  Visited With Patient  Visit Type Follow-up  Advance Directives (For Healthcare)  Does Patient Have a Medical Advance Directive? Yes

## 2017-12-06 NOTE — Progress Notes (Signed)
3 Days Post-Op Procedure(s) (LRB): IABP INSERTION (N/A) Subjective: Acute MI/ V fib arrest- now recovered but on IABP for severe CAD - chronic occlusion of LAD,RCA and stenosis of circumflex Plan CABG in am I have discussed the procedure in detail with patient who understands and agrees to proceed with surgery Objective: Vital signs in last 24 hours: Temp:  [98.1 F (36.7 C)-98.4 F (36.9 C)] 98.2 F (36.8 C) (12/13 1136) Pulse Rate:  [62-77] 71 (12/13 1500) Cardiac Rhythm: Normal sinus rhythm (12/13 1200) Resp:  [16-26] 21 (12/13 1500) BP: (114-147)/(52-109) 126/78 (12/13 1500) SpO2:  [95 %-100 %] 100 % (12/13 1500) Arterial Line BP: (101-133)/(49-71) 115/55 (12/13 1500) Weight:  [154 lb 15.7 oz (70.3 kg)] 154 lb 15.7 oz (70.3 kg) (12/13 0500)  Hemodynamic parameters for last 24 hours: CVP:  [3 mmHg-8 mmHg] 4 mmHg  Intake/Output from previous day: 12/12 0701 - 12/13 0700 In: 1896.1 [P.O.:1200; I.V.:546.1; IV Piggyback:150] Out: 2750 [Urine:2750] Intake/Output this shift: Total I/O In: 400.2 [P.O.:220; I.V.:180.2] Out: 875 [Urine:875]       Exam    General- alert and comfortable on IABP   Lungs- clear without rales, wheezes   Cor- regular rate and rhythm, no murmur , gallop   Abdomen- soft, non-tender   Extremities - warm, non-tender, minimal edema   Neuro- oriented, appropriate, no focal weakness   Lab Results: Recent Labs    12/05/17 0451 12/06/17 0503  WBC 7.8 7.4  HGB 11.5* 11.6*  HCT 33.3* 33.6*  PLT 176 202   BMET:  Recent Labs    12/05/17 0451 12/06/17 0503  NA 139 137  K 3.6 3.6  CL 107 105  CO2 26 25  GLUCOSE 110* 124*  BUN 11 13  CREATININE 1.00 1.04  CALCIUM 8.4* 8.6*    PT/INR:  Recent Labs    12/05/17 0451  LABPROT 14.0  INR 1.08   ABG    Component Value Date/Time   PHART 7.390 12/06/2017 0423   HCO3 21.5 12/06/2017 0423   TCO2 23 12/06/2017 0423   ACIDBASEDEF 3.0 (H) 12/06/2017 0423   O2SAT 65.7 12/06/2017 0538   CBG  (last 3)  Recent Labs    12/06/17 0418 12/06/17 0730 12/06/17 1116  GLUCAP 111* 87 119*    Assessment/Plan: S/P Procedure(s) (LRB): IABP INSERTION (N/A) CABG in AM   LOS: 7 days    Kathlee Nationseter Van Trigt III 12/06/2017

## 2017-12-06 NOTE — Progress Notes (Signed)
Advanced Heart Failure Progress   [facility] Cardi[facility]:  [facility] (Dr. Okey DupreEnd) Primary HF: New (Dr. [name])   HPI:    IABP removed 12/10 and he had recurrent VF arrest. IABP replaced.  Remains on IABP 1:1. Remains on milrinone 0.25 mcg. CO-OX 66%.   Denies CP/dyspnea.  Appetite improved. CXR clear but IABP low (Personally reviewed)  Echo reviewed personally. EF ~30% RV normal.   LHC 11/29/17 Left Main  Dist LM lesion 40% stenosed  Left Anterior Descending  Prox LAD-1 lesion is 60% stenosed. The lesion is calcified.  Prox LAD-2 lesion 80% stenosed  First Diagonal Branch  Vessel is small in size.  Ost 1st Diag to 1st Diag lesion 70% stenosed  Second Diagonal Branch  Vessel is small in size. There is mild disease in the vessel.  Left Circumflex  Ost Cx lesion 30% stenosed  Prox Cx lesion is 80% stenosed. The lesion is calcified.  Prox Cx to Mid Cx lesion 80% stenosed  First Obtuse Marginal Branch  Vessel is small in size.  Ost 1st Mrg lesion 90% stenosed  Second Obtuse Marginal Branch  Ost 2nd Mrg to 2nd Mrg lesion 60% stenosed  Right Coronary Artery  Collaterals  Mid RCA filled by collaterals from Prox RCA.    Prox RCA to Mid RCA lesion is 100% stenosed. The lesion is chronically occluded with bridging and left-to-right collateral flow.  Right Posterior Descending Artery  Collaterals  RPDA filled by collaterals from 2nd Sept.     Objective:    Vital Signs:   Temp:  [97.8 F (36.6 C)-98.4 F (36.9 C)] 98.1 F (36.7 C) (12/13 0400) Pulse Rate:  [62-167] 68 (12/13 0700) Resp:  [13-26] 20 (12/13 0700) BP: (122-147)/(63-109) 137/77 (12/13 0700) SpO2:  [94 %-100 %] 95 % (12/13 0700) Arterial Line BP: (102-133)/(49-71) 103/49 (12/13 0700) Weight:  [154 lb 15.7 oz (70.3 kg)] 154 lb 15.7 oz (70.3 kg) (12/13 0500) Last BM Date: 12/06/17  Weight change: Filed Weights   12/04/17 0500 12/05/17 0500 12/06/17 0500  Weight: 152 lb 5.4 oz (69.1 kg) 153 lb 7 oz  (69.6 kg) 154 lb 15.7 oz (70.3 kg)    Intake/Output:   Intake/Output Summary (Last 24 hours) at 12/06/2017 0800 Last data filed at 12/06/2017 0700 Gross per 24 hour  Intake 1872.33 ml  Output 2750 ml  Net -877.67 ml      Physical Exam   CVP 6 General: . No resp difficulty HEENT: normal Neck: supple. no JVD. Carotids 2+ bilat; no bruits. No lymphadenopathy or thryomegaly appreciated. Cor: PMI nondisplaced. Regular rate & rhythm. No rubs, gallops or murmurs. Lungs: clear Abdomen: soft, nontender, nondistended. No hepatosplenomegaly. No bruits or masses. Good bowel sounds. Extremities: no cyanosis, clubbing, rash, edema. Left groin IABP. LUE PICC RUE midline.  Neuro: alert & orientedx3, cranial nerves grossly intact. moves all 4 extremities w/o difficulty. Affect pleasant   Telemetry   NSR 60s personally reviewed.    EKG    11/29/17 Sinus tach 135  Labs   Basic Metabolic Panel: Recent Labs  Lab 12/01/17 0457 12/01/17 1805 12/02/17 0333 12/03/17 0052 12/03/17 0354 12/04/17 0331 12/05/17 0451 12/06/17 0503  NA 136  --  139 138 137 138 139 137  K 3.5  --  3.5 4.1 4.2 4.1 3.6 3.6  CL 108  --  112* 107 106 106 107 105  CO2 21*  --  20* 25 23 25 26 25   GLUCOSE 118*  --  134* 105*  105* 108* 110* 124*  BUN 6  --  5* 8 5* 8 11 13   CREATININE 1.02  --  0.93 0.98 0.86 0.94 1.00 1.04  CALCIUM 7.7*  --  8.0* 8.4* 8.5* 8.5* 8.4* 8.6*  MG 2.0 1.9 1.8 2.2 1.7 2.1  --   --   PHOS 1.7* 2.7 2.5  --  3.7 3.0  --   --     Liver Function Tests: Recent Labs  Lab 11/29/17 1638 12/01/17 0457 12/05/17 0451 12/06/17 0503  AST 78* 81* 18 22  ALT 37 30 17 22   ALKPHOS 45 35* 30* 32*  BILITOT 1.0 0.9 0.8 0.6  PROT 6.6 5.1* 5.2* 5.6*  ALBUMIN 4.1 2.9* 2.7* 2.8*   No results for input(s): LIPASE, AMYLASE in the last 168 hours. No results for input(s): AMMONIA in the last 168 hours.  CBC: Recent Labs  Lab 11/29/17 1638  12/02/17 0333 12/03/17 0354 12/04/17 0331  12/05/17 0451 12/06/17 0503  WBC 10.8*   < > 6.1 7.9 9.3 7.8 7.4  NEUTROABS 6.5  --   --   --   --   --   --   HGB 14.3   < > 11.5* 11.5* 12.1* 11.5* 11.6*  HCT 42.5   < > 34.7* 34.0* 36.0* 33.3* 33.6*  MCV 97.0   < > 95.6 93.9 94.5 93.0 92.3  PLT 241   < > PLATELET CLUMPS NOTED ON SMEAR, COUNT APPEARS ADEQUATE 122* 178 176 202   < > = values in this interval not displayed.    Cardiac Enzymes: No results for input(s): CKTOTAL, CKMB, CKMBINDEX, TROPONINI in the last 168 hours.  BNP: BNP (last 3 results) No results for input(s): BNP in the last 8760 hours.  ProBNP (last 3 results) No results for input(s): PROBNP in the last 8760 hours.   CBG: Recent Labs  Lab 12/05/17 1528 12/05/17 2000 12/05/17 2333 12/06/17 0418 12/06/17 0730  GLUCAP 108* 105* 108* 111* 87    Coagulation Studies: Recent Labs    12/05/17 0451  LABPROT 14.0  INR 1.08    Imaging   Dg Chest Port 1 View  Result Date: 12/06/2017 CLINICAL DATA:  Shortness of breath. EXAM: PORTABLE CHEST 1 VIEW COMPARISON:  12/05/2017. FINDINGS: Interim removal right IJ sheath. Intra-aortic balloon pump in unchanged position. Left PICC line in unchanged position. Mediastinum and hilar structures normal. Cardiomegaly. Mild left base atelectasis. Cardiomegaly with normal pulmonary vascularity. IMPRESSION: 1. Interim removal of right IJ sheath. Intra-aortic balloon pump in unchanged position. Left PICC line in unchanged position . 2. Stable cardiomegaly. 3.  Mild left base subsegmental atelectasis. Electronically Signed   By: Maisie Fushomas  Register   On: 12/06/2017 07:56   Dg Chest Port 1 View  Result Date: 12/05/2017 CLINICAL DATA:  CHF EXAM: PORTABLE CHEST 1 VIEW COMPARISON:  12/04/2017 FINDINGS: Intra-aortic balloon pump is at the T6-7 level. Left PICC line has been placed with the tip at the cavoatrial junction. NG tube has been removed. Cardiomegaly. Left base atelectasis. No edema or effusions. IMPRESSION: Intra-aortic  balloon pump tip slightly retracted at the T6-7 level. Cardiomegaly.  Left base atelectasis. Electronically Signed   By: Charlett NoseKevin  Dover M.D.   On: 12/05/2017 09:50     Medications:     Current Medications: . aspirin  81 mg Oral Daily  . atorvastatin  80 mg Oral q1800  . Chlorhexidine Gluconate Cloth  6 each Topical Daily  . Chlorhexidine Gluconate Cloth  6 each Topical Daily  .  digoxin  0.125 mg Oral Daily  . enalapril  2.5 mg Oral BID  . feeding supplement (ENSURE ENLIVE)  237 mL Oral TID BM  . feeding supplement (PRO-STAT SUGAR FREE 64)  30 mL Oral BID WC  . folic acid  1 mg Oral Daily  . insulin aspart  2-6 Units Subcutaneous Q4H  . mouth rinse  15 mL Mouth Rinse BID  . pantoprazole  40 mg Oral QHS  . sodium chloride flush  10-40 mL Intracatheter Q12H  . sodium chloride flush  3 mL Intravenous Q12H  . spironolactone  25 mg Oral Daily  . thiamine  100 mg Oral Daily    Infusions: . sodium chloride Stopped (12/05/17 0917)  . sodium chloride    . dexmedetomidine (PRECEDEX) IV infusion 0.2 mcg/kg/hr (12/06/17 0211)  . heparin 1,400 Units/hr (12/06/17 0751)  . milrinone 0.25 mcg/kg/min (12/06/17 0211)  . norepinephrine (LEVOPHED) Adult infusion Stopped (12/05/17 0853)  . piperacillin-tazobactam (ZOSYN)  IV Stopped (12/06/17 0440)    Patient Profile   [name] is a 56 y.o. male with unknown medical history who presented [facility] after VT/VF arrest. Intubated emergently in ER. Chest CT with RUL collapse. Pt underwent bronch and taken emergently to cath lab.  Cath showed Severe 3v CAD and elevated LVEDP. IABP placed and started on milrinone with low output CHF.   On 12/10 IABP removed and had recurrent VF. IABP replaced.  Assessment/Plan   1. VT/VF Arrest -> Cardiogenic shock - EF 25-30% by echo due to iCM - Had recurrent VF arrest on 12/10 after IABP removed. IABP put back 12/10 - Remains on IABP 1:1 milrinone 0.25 mcg. CO-oX 66% - Continue IABP.  - CABG in  am.    2. CAD - Severe 3vD as above - Continue IABP.  - Todays CO-OX is 66%.   - On ASA/statin and heaprin - CABG in am.    3. Acute systolic CHF due to ICM - EF 25-30% - Continue milrinone at 0.25. Todays Co-ox 66% - Continue  spiro to 25 mg daily.  - Continue digoxin 0.125 mg daily.  - Continue enalapril 2.5 mg daily.  - No BB   4. Acute respiratory failure with RUL collapse and aspiration PNA by CT.  - Self extubated 12/9 .  - Stable on 2 liters oxygen.   5. Severe ETOH abuse - Continue CIWA  6. Hypokalemia - K 3.6. Continue spiro daily.   7. F/E/N - passed swallow. Encouraged po intake.   Length of Stay: 7  Amy Clegg, NP  12/06/2017, 8:00 AM  Advanced Heart Failure Team Pager [phone] (M-F; 7a - 4p)  Please contact [facility] for night-coverage after hours (4p -7a ) and weekends on [email or web address]  Agree with above.  He remains tenuous but improved. On IABP 1:1. No CP. No further VT/VF. Co-ox stable at 65%. IABP waveform ok. I repositioned IABP and confirmed with CXR.   On exam Lying in bed. Cor RRR  Lungs CTA Ab soft NT/ND Ext warm no edema. LFA IABP L DP 2+   No further VT/VF.  Hemodynamics stable. Plan CABG tomorrow.   CRITICAL CARE Performed by: Arvilla Meres  Total critical care time: 35 minutes  Critical care time was exclusive of separately billable procedures and treating other patients.  Critical care was necessary to treat or prevent imminent or life-threatening deterioration.  Critical care was time spent personally by me (independent of midlevel providers or residents) on the following activities: development of  treatment plan with patient and/or surrogate as well as nursing, discussions with consultants, evaluation of patient's response to treatment, examination of patient, obtaining history from patient or surrogate, ordering and performing treatments and interventions, ordering and review of laboratory studies, ordering and review  of radiographic studies, pulse oximetry and re-evaluation of patient's condition.  Arvilla Meres, MD  2:53 PM

## 2017-12-06 NOTE — Progress Notes (Signed)
ANTICOAGULATION CONSULT NOTE - Follow Up Consult  Pharmacy Consult:  Heparin Indication: IABP  No Known Allergies  Patient Measurements: Height: 5\' 10"  (177.8 cm) Weight: 154 lb 15.7 oz (70.3 kg) IBW/kg (Calculated) : 73  Heparin dosing weight = 74 kg   Vital Signs: Temp: 98.2 F (36.8 C) (12/13 1136) Temp Source: Oral (12/13 1136) BP: 124/70 (12/13 1200) Pulse Rate: 70 (12/13 1200)  Labs: Recent Labs    12/04/17 0331  12/05/17 0451 12/06/17 0503 12/06/17 1257  HGB 12.1*  --  11.5* 11.6*  --   HCT 36.0*  --  33.3* 33.6*  --   PLT 178  --  176 202  --   APTT  --   --   --   --  56*  LABPROT  --   --  14.0  --   --   INR  --   --  1.08  --   --   HEPARINUNFRC 0.34   < > 0.19* 0.19* 0.19*  CREATININE 0.94  --  1.00 1.04  --    < > = values in this interval not displayed.    Estimated Creatinine Clearance: 80.7 mL/min (by C-G formula based on SCr of 1.04 mg/dL).  Medications: Heparin @ 1400 units/hr  Assessment: 56 YOM s/p cardiac arrest/cath/IABP placement and started on IV heparin.  IABP removed and IV heparin was transitioned to SQ Lovenox. Patient again suffered from cardiac arrest on 12/03/17 and IABP was re-inserted.  Pharmacy consulted to resume IV heparin.  Noted that patient received 5000 units bolus in the cath lab.    Heparin level remains 0.19 despite multiple rate increases. CBC ok. No bleeding.  Goal of Therapy:  Heparin level 0.2-0.5 units/ml Monitor platelets by anticoagulation protocol: Yes    Plan:  1) Increase heparin to 1550 units/hr 2) Daily heparin level and CBC 3) Follow up after CABG tomorrow  Louie CasaJennifer Sylar Voong, PharmD, BCPS 12/06/2017 1:50 PM

## 2017-12-06 NOTE — Plan of Care (Signed)
Patient remains stable awaiting CABG on 12/07/2017.  IABP remains stable and functioning properly.  Pt does seem to be progressing well and education about upcoming procedure seems to be effective.  Pt indicates understanding of present illness and upcoming intervention.

## 2017-12-07 ENCOUNTER — Inpatient Hospital Stay (HOSPITAL_COMMUNITY): Payer: Medicaid Other

## 2017-12-07 ENCOUNTER — Encounter (HOSPITAL_COMMUNITY)
Admission: EM | Disposition: A | Discharge status: Home Health | Payer: Self-pay | Source: Home / Self Care | Attending: Internal Medicine

## 2017-12-07 ENCOUNTER — Encounter (HOSPITAL_COMMUNITY): Payer: Self-pay | Admitting: Certified Registered Nurse Anesthetist

## 2017-12-07 ENCOUNTER — Inpatient Hospital Stay (HOSPITAL_COMMUNITY): Payer: Medicaid Other | Admitting: Certified Registered Nurse Anesthetist

## 2017-12-07 DIAGNOSIS — I2510 Atherosclerotic heart disease of native coronary artery without angina pectoris: Secondary | ICD-10-CM

## 2017-12-07 HISTORY — PX: TEE WITHOUT CARDIOVERSION: SHX5443

## 2017-12-07 HISTORY — DX: Atherosclerotic heart disease of native coronary artery without angina pectoris: I25.10

## 2017-12-07 HISTORY — PX: CORONARY ARTERY BYPASS GRAFT: SHX141

## 2017-12-07 LAB — CREATININE, SERUM
Creatinine, Ser: 0.85 mg/dL (ref 0.61–1.24)
GFR calc Af Amer: >60 mL/min (ref >60)
GFR calc non Af Amer: >60 mL/min (ref >60)

## 2017-12-07 LAB — COMPREHENSIVE METABOLIC PANEL
ALT: 29 U/L (ref 17–63)
AST: 23 U/L (ref 15–41)
Albumin: 3 g/dL — ABNORMAL LOW (ref 3.5–5.0)
Alkaline Phosphatase: 40 U/L (ref 38–126)
Anion gap: 6 (ref 5–15)
BUN: 10 mg/dL (ref 6–20)
CO2: 26 mmol/L (ref 22–32)
Calcium: 8.8 mg/dL — ABNORMAL LOW (ref 8.9–10.3)
Chloride: 108 mmol/L (ref 101–111)
Creatinine, Ser: 1.01 mg/dL (ref 0.61–1.24)
GFR calc Af Amer: >60 mL/min (ref >60)
GFR calc non Af Amer: >60 mL/min (ref >60)
Glucose, Bld: 130 mg/dL — ABNORMAL HIGH (ref 65–99)
Potassium: 3.5 mmol/L (ref 3.5–5.1)
Sodium: 140 mmol/L (ref 135–145)
Total Bilirubin: 0.7 mg/dL (ref 0.3–1.2)
Total Protein: 5.6 g/dL — ABNORMAL LOW (ref 6.5–8.1)

## 2017-12-07 LAB — MAGNESIUM: Magnesium: 2.8 mg/dL — ABNORMAL HIGH (ref 1.7–2.4)

## 2017-12-07 LAB — POCT I-STAT, CHEM 8
BUN: 8 mg/dL (ref 6–20)
CREATININE: 0.7 mg/dL (ref 0.61–1.24)
Calcium, Ion: 1.15 mmol/L (ref 1.15–1.40)
Chloride: 108 mmol/L (ref 101–111)
GLUCOSE: 115 mg/dL — AB (ref 65–99)
HEMATOCRIT: 28 % — AB (ref 39.0–52.0)
HEMOGLOBIN: 9.5 g/dL — AB (ref 13.0–17.0)
POTASSIUM: 4.5 mmol/L (ref 3.5–5.1)
Sodium: 143 mmol/L (ref 135–145)
TCO2: 23 mmol/L (ref 22–32)

## 2017-12-07 LAB — GLUCOSE, CAPILLARY
GLUCOSE-CAPILLARY: 106 mg/dL — AB (ref 65–99)
GLUCOSE-CAPILLARY: 113 mg/dL — AB (ref 65–99)
GLUCOSE-CAPILLARY: 117 mg/dL — AB (ref 65–99)
GLUCOSE-CAPILLARY: 131 mg/dL — AB (ref 65–99)
GLUCOSE-CAPILLARY: 132 mg/dL — AB (ref 65–99)
GLUCOSE-CAPILLARY: 139 mg/dL — AB (ref 65–99)
Glucose-Capillary: 123 mg/dL — ABNORMAL HIGH (ref 65–99)
Glucose-Capillary: 117 mg/dL — ABNORMAL HIGH (ref 65–99)
Glucose-Capillary: 106 mg/dL — ABNORMAL HIGH (ref 65–99)

## 2017-12-07 LAB — CBC
HCT: 35.9 % — ABNORMAL LOW (ref 39.0–52.0)
HCT: 30.4 % — ABNORMAL LOW (ref 39.0–52.0)
HEMATOCRIT: 32.5 % — AB (ref 39.0–52.0)
HEMOGLOBIN: 12.2 g/dL — AB (ref 13.0–17.0)
HEMOGLOBIN: 11.2 g/dL — AB (ref 13.0–17.0)
Hemoglobin: 10.4 g/dL — ABNORMAL LOW (ref 13.0–17.0)
MCH: 31.4 pg (ref 26.0–34.0)
MCH: 31.4 pg (ref 26.0–34.0)
MCH: 31 pg (ref 26.0–34.0)
MCHC: 34 g/dL (ref 30.0–36.0)
MCHC: 34.5 g/dL (ref 30.0–36.0)
MCHC: 34.2 g/dL (ref 30.0–36.0)
MCV: 92.5 fL (ref 78.0–100.0)
MCV: 91 fL (ref 78.0–100.0)
MCV: 90.7 fL (ref 78.0–100.0)
Platelets: 270 10*3/uL (ref 150–400)
Platelets: 161 10*3/uL (ref 150–400)
Platelets: 152 10*3/uL (ref 150–400)
RBC: 3.88 MIL/uL — AB (ref 4.22–5.81)
RBC: 3.57 MIL/uL — AB (ref 4.22–5.81)
RBC: 3.35 MIL/uL — ABNORMAL LOW (ref 4.22–5.81)
RDW: 12.2 % (ref 11.5–15.5)
RDW: 12.8 % (ref 11.5–15.5)
RDW: 12.8 % (ref 11.5–15.5)
WBC: 8.2 10*3/uL (ref 4.0–10.5)
WBC: 15.7 10*3/uL — AB (ref 4.0–10.5)
WBC: 9.2 10*3/uL (ref 4.0–10.5)

## 2017-12-07 LAB — POCT I-STAT 3, ART BLOOD GAS (G3+)
ACID-BASE DEFICIT: 2 mmol/L (ref 0.0–2.0)
BICARBONATE: 23.4 mmol/L (ref 20.0–28.0)
O2 SAT: 98 %
PH ART: 7.403 (ref 7.350–7.450)
TCO2: 25 mmol/L (ref 22–32)
pCO2 arterial: 37 mmHg (ref 32.0–48.0)
pO2, Arterial: 103 mmHg (ref 83.0–108.0)

## 2017-12-07 LAB — POCT I-STAT 4, (NA,K, GLUC, HGB,HCT)
Glucose, Bld: 136 mg/dL — ABNORMAL HIGH (ref 65–99)
HCT: 29 % — ABNORMAL LOW (ref 39.0–52.0)
Hemoglobin: 9.9 g/dL — ABNORMAL LOW (ref 13.0–17.0)
POTASSIUM: 3.6 mmol/L (ref 3.5–5.1)
SODIUM: 145 mmol/L (ref 135–145)

## 2017-12-07 LAB — PROTIME-INR
INR: 1.48
PROTHROMBIN TIME: 17.8 s — AB (ref 11.4–15.2)

## 2017-12-07 LAB — COOXEMETRY PANEL
Carboxyhemoglobin: 1 % (ref 0.5–1.5)
Methemoglobin: 1.1 % (ref 0.0–1.5)
O2 Saturation: 80.3 %
Total hemoglobin: 12.5 g/dL (ref 12.0–16.0)

## 2017-12-07 LAB — PLATELET COUNT: Platelets: 172 10*3/uL (ref 150–400)

## 2017-12-07 LAB — HEPARIN LEVEL (UNFRACTIONATED): Heparin Unfractionated: 0.27 IU/mL — ABNORMAL LOW (ref 0.30–0.70)

## 2017-12-07 LAB — HEMOGLOBIN AND HEMATOCRIT, BLOOD
HCT: 27.6 % — ABNORMAL LOW (ref 39.0–52.0)
Hemoglobin: 9.5 g/dL — ABNORMAL LOW (ref 13.0–17.0)

## 2017-12-07 LAB — APTT: APTT: 29 s (ref 24–36)

## 2017-12-07 LAB — PREPARE RBC (CROSSMATCH)

## 2017-12-07 SURGERY — CORONARY ARTERY BYPASS GRAFTING (CABG)
Anesthesia: General | Site: Chest

## 2017-12-07 MED ORDER — PHENYLEPHRINE HCL 10 MG/ML IJ SOLN
INTRAVENOUS | Status: DC | PRN
  Administered 2017-12-07: 30 ug/min via INTRAVENOUS

## 2017-12-07 MED ORDER — FENTANYL CITRATE (PF) 250 MCG/5ML IJ SOLN
INTRAMUSCULAR | Status: AC
  Filled 2017-12-07: qty 25

## 2017-12-07 MED ORDER — LIDOCAINE IN D5W 4-5 MG/ML-% IV SOLN
1.0000 mg/min | INTRAVENOUS | Status: DC
  Administered 2017-12-07: 2 mg/min via INTRAVENOUS
  Filled 2017-12-07: qty 500

## 2017-12-07 MED ORDER — SODIUM CHLORIDE 0.9 % IV SOLN
20.0000 ug | Freq: Once | INTRAVENOUS | Status: AC
  Administered 2017-12-07: 20 ug via INTRAVENOUS
  Filled 2017-12-07: qty 5

## 2017-12-07 MED ORDER — SUCCINYLCHOLINE CHLORIDE 200 MG/10ML IV SOSY
PREFILLED_SYRINGE | INTRAVENOUS | Status: AC
  Filled 2017-12-07: qty 10

## 2017-12-07 MED ORDER — FENTANYL CITRATE (PF) 100 MCG/2ML IJ SOLN
INTRAMUSCULAR | Status: DC | PRN
  Administered 2017-12-07: 500 ug via INTRAVENOUS
  Administered 2017-12-07: 250 ug via INTRAVENOUS
  Administered 2017-12-07: 50 ug via INTRAVENOUS
  Administered 2017-12-07: 150 ug via INTRAVENOUS
  Administered 2017-12-07: 100 ug via INTRAVENOUS
  Administered 2017-12-07: 150 ug via INTRAVENOUS
  Administered 2017-12-07: 100 ug via INTRAVENOUS
  Administered 2017-12-07: 50 ug via INTRAVENOUS
  Administered 2017-12-07: 150 ug via INTRAVENOUS

## 2017-12-07 MED ORDER — PROPOFOL 10 MG/ML IV BOLUS
INTRAVENOUS | Status: DC | PRN
  Administered 2017-12-07: 50 mg via INTRAVENOUS

## 2017-12-07 MED ORDER — LACTATED RINGERS IV SOLN
INTRAVENOUS | Status: DC | PRN
  Administered 2017-12-07: 07:00:00 via INTRAVENOUS

## 2017-12-07 MED ORDER — LEVALBUTEROL HCL 0.63 MG/3ML IN NEBU
0.6300 mg | INHALATION_SOLUTION | Freq: Three times a day (TID) | RESPIRATORY_TRACT | Status: DC
  Administered 2017-12-07 – 2017-12-13 (×18): 0.63 mg via RESPIRATORY_TRACT
  Filled 2017-12-07 (×20): qty 3

## 2017-12-07 MED ORDER — LACTATED RINGERS IV SOLN: INTRAVENOUS | Status: DC

## 2017-12-07 MED ORDER — ARTIFICIAL TEARS OPHTHALMIC OINT
TOPICAL_OINTMENT | OPHTHALMIC | Status: DC | PRN
  Administered 2017-12-07: 1 via OPHTHALMIC

## 2017-12-07 MED ORDER — PROPOFOL 10 MG/ML IV BOLUS
INTRAVENOUS | Status: AC
  Filled 2017-12-07: qty 20

## 2017-12-07 MED ORDER — FENTANYL CITRATE (PF) 250 MCG/5ML IJ SOLN
INTRAMUSCULAR | Status: AC
  Filled 2017-12-07: qty 5

## 2017-12-07 MED ORDER — ACETAMINOPHEN 160 MG/5ML PO SOLN
1000.0000 mg | Freq: Four times a day (QID) | ORAL | Status: AC
  Administered 2017-12-08 (×4): 1000 mg
  Filled 2017-12-07 (×4): qty 40.6

## 2017-12-07 MED ORDER — INSULIN REGULAR BOLUS VIA INFUSION
0.0000 [IU] | Freq: Three times a day (TID) | INTRAVENOUS | Status: DC
Start: 2017-12-07 — End: 2017-12-08
  Administered 2017-12-08: 0 [IU] via INTRAVENOUS
  Administered 2017-12-08: 1.2 [IU]/h via INTRAVENOUS
  Filled 2017-12-07: qty 10

## 2017-12-07 MED ORDER — LACTATED RINGERS IV SOLN
INTRAVENOUS | Status: DC | PRN
  Administered 2017-12-07: 08:00:00 via INTRAVENOUS

## 2017-12-07 MED ORDER — NOREPINEPHRINE BITARTRATE 1 MG/ML IV SOLN
2.0000 ug/min | INTRAVENOUS | Status: DC
  Filled 2017-12-07: qty 16

## 2017-12-07 MED ORDER — ARTIFICIAL TEARS OPHTHALMIC OINT
TOPICAL_OINTMENT | OPHTHALMIC | Status: AC
  Filled 2017-12-07: qty 3.5

## 2017-12-07 MED ORDER — ASPIRIN EC 325 MG PO TBEC
325.0000 mg | DELAYED_RELEASE_TABLET | Freq: Every day | ORAL | Status: DC
  Administered 2017-12-09 – 2017-12-13 (×4): 325 mg via ORAL
  Filled 2017-12-07 (×4): qty 1

## 2017-12-07 MED ORDER — AMIODARONE HCL IN DEXTROSE 360-4.14 MG/200ML-% IV SOLN
30.0000 mg/h | INTRAVENOUS | Status: DC
  Filled 2017-12-07: qty 200

## 2017-12-07 MED ORDER — AMIODARONE IV BOLUS ONLY 150 MG/100ML
150.0000 mg | Freq: Once | INTRAVENOUS | Status: AC
  Administered 2017-12-07: 150 mg via INTRAVENOUS

## 2017-12-07 MED ORDER — CHLORHEXIDINE GLUCONATE 0.12% ORAL RINSE (MEDLINE KIT)
15.0000 mL | Freq: Two times a day (BID) | OROMUCOSAL | Status: DC
  Administered 2017-12-07: 15 mL via OROMUCOSAL

## 2017-12-07 MED ORDER — SODIUM CHLORIDE 0.9% FLUSH
3.0000 mL | Freq: Two times a day (BID) | INTRAVENOUS | Status: DC
  Administered 2017-12-08 – 2017-12-12 (×8): 3 mL via INTRAVENOUS

## 2017-12-07 MED ORDER — SODIUM CHLORIDE 0.9 % IV SOLN
0.0000 ug/min | INTRAVENOUS | Status: DC
  Filled 2017-12-07 (×2): qty 2

## 2017-12-07 MED ORDER — OXYCODONE HCL 5 MG PO TABS
5.0000 mg | ORAL_TABLET | ORAL | Status: DC | PRN
  Administered 2017-12-09 – 2017-12-10 (×3): 5 mg via ORAL
  Filled 2017-12-07 (×3): qty 1

## 2017-12-07 MED ORDER — THROMBIN (RECOMBINANT) 5000 UNITS EX SOLR
CUTANEOUS | Status: AC
  Filled 2017-12-07: qty 5000

## 2017-12-07 MED ORDER — DEXTROSE 5 % IV SOLN
1.5000 g | Freq: Two times a day (BID) | INTRAVENOUS | Status: AC
  Administered 2017-12-07 – 2017-12-09 (×4): 1.5 g via INTRAVENOUS
  Filled 2017-12-07 (×4): qty 1.5

## 2017-12-07 MED ORDER — MIDAZOLAM HCL 5 MG/5ML IJ SOLN
INTRAMUSCULAR | Status: DC | PRN
  Administered 2017-12-07 (×2): 1 mg via INTRAVENOUS
  Administered 2017-12-07: 2 mg via INTRAVENOUS
  Administered 2017-12-07: 1 mg via INTRAVENOUS
  Administered 2017-12-07: 5 mg via INTRAVENOUS

## 2017-12-07 MED ORDER — PLASMA-LYTE 148 IV SOLN
INTRAVENOUS | Status: DC | PRN
  Administered 2017-12-07 (×2): 500 mL via INTRAVASCULAR

## 2017-12-07 MED ORDER — METOPROLOL TARTRATE 5 MG/5ML IV SOLN
2.5000 mg | INTRAVENOUS | Status: DC | PRN
  Administered 2017-12-09: 2.5 mg via INTRAVENOUS
  Filled 2017-12-07: qty 5

## 2017-12-07 MED ORDER — ONDANSETRON HCL 4 MG/2ML IJ SOLN: 4.0000 mg | Freq: Four times a day (QID) | INTRAMUSCULAR | Status: DC | PRN

## 2017-12-07 MED ORDER — ACETAMINOPHEN 650 MG RE SUPP
650.0000 mg | Freq: Once | RECTAL | Status: AC
  Administered 2017-12-07: 650 mg via RECTAL

## 2017-12-07 MED ORDER — ALBUMIN HUMAN 5 % IV SOLN
INTRAVENOUS | Status: DC | PRN
  Administered 2017-12-07: 14:00:00 via INTRAVENOUS

## 2017-12-07 MED ORDER — PHENYLEPHRINE 40 MCG/ML (10ML) SYRINGE FOR IV PUSH (FOR BLOOD PRESSURE SUPPORT)
PREFILLED_SYRINGE | INTRAVENOUS | Status: DC | PRN
  Administered 2017-12-07: 80 ug via INTRAVENOUS
  Administered 2017-12-07: 40 ug via INTRAVENOUS
  Administered 2017-12-07: 80 ug via INTRAVENOUS

## 2017-12-07 MED ORDER — PANTOPRAZOLE SODIUM 40 MG PO TBEC
40.0000 mg | DELAYED_RELEASE_TABLET | Freq: Every day | ORAL | Status: DC
  Administered 2017-12-09 – 2017-12-13 (×5): 40 mg via ORAL
  Filled 2017-12-07 (×5): qty 1

## 2017-12-07 MED ORDER — LACTATED RINGERS IV SOLN
INTRAVENOUS | Status: DC | PRN
  Administered 2017-12-07 (×2): via INTRAVENOUS

## 2017-12-07 MED ORDER — METOPROLOL TARTRATE 25 MG/10 ML ORAL SUSPENSION: 12.5000 mg | Freq: Two times a day (BID) | ORAL | Status: DC

## 2017-12-07 MED ORDER — PROTAMINE SULFATE 10 MG/ML IV SOLN
INTRAVENOUS | Status: DC | PRN
Start: 2017-12-07 — End: 2017-12-07
  Administered 2017-12-07: 80 mg via INTRAVENOUS
  Administered 2017-12-07: 60 mg via INTRAVENOUS
  Administered 2017-12-07: 50 mg via INTRAVENOUS
  Administered 2017-12-07: 20 mg via INTRAVENOUS
  Administered 2017-12-07: 50 mg via INTRAVENOUS

## 2017-12-07 MED ORDER — SODIUM CHLORIDE 0.9 % IV SOLN
INTRAVENOUS | Status: DC
  Administered 2017-12-08: 3.1 [IU]/h via INTRAVENOUS
  Filled 2017-12-07: qty 1

## 2017-12-07 MED ORDER — LIDOCAINE 2% (20 MG/ML) 5 ML SYRINGE
INTRAMUSCULAR | Status: DC | PRN
  Administered 2017-12-07: 40 mg via INTRAVENOUS

## 2017-12-07 MED ORDER — FAMOTIDINE IN NACL 20-0.9 MG/50ML-% IV SOLN
20.0000 mg | Freq: Two times a day (BID) | INTRAVENOUS | Status: AC
  Administered 2017-12-07 (×2): 20 mg via INTRAVENOUS
  Filled 2017-12-07: qty 50

## 2017-12-07 MED ORDER — MILRINONE LACTATE IN DEXTROSE 20-5 MG/100ML-% IV SOLN
0.1250 ug/kg/min | INTRAVENOUS | Status: DC
  Administered 2017-12-08 (×2): 0.25 ug/kg/min via INTRAVENOUS
  Administered 2017-12-10: 0.125 ug/kg/min via INTRAVENOUS
  Filled 2017-12-07 (×3): qty 100

## 2017-12-07 MED ORDER — SODIUM CHLORIDE 0.9 % IV SOLN: 250.0000 mL | INTRAVENOUS | Status: DC

## 2017-12-07 MED ORDER — SODIUM CHLORIDE 0.9 % IV SOLN: Freq: Once | INTRAVENOUS | Status: DC

## 2017-12-07 MED ORDER — POTASSIUM CHLORIDE 10 MEQ/50ML IV SOLN
10.0000 meq | INTRAVENOUS | Status: AC
  Administered 2017-12-07 (×3): 10 meq via INTRAVENOUS

## 2017-12-07 MED ORDER — ACETAMINOPHEN 160 MG/5ML PO SOLN: 650.0000 mg | Freq: Once | ORAL | Status: AC

## 2017-12-07 MED ORDER — MAGNESIUM SULFATE 4 GM/100ML IV SOLN
4.0000 g | Freq: Once | INTRAVENOUS | Status: AC
  Administered 2017-12-07: 4 g via INTRAVENOUS
  Filled 2017-12-07: qty 100

## 2017-12-07 MED ORDER — HEPARIN SODIUM (PORCINE) 1000 UNIT/ML IJ SOLN
INTRAMUSCULAR | Status: AC
  Filled 2017-12-07: qty 1

## 2017-12-07 MED ORDER — MORPHINE SULFATE (PF) 4 MG/ML IV SOLN: 1.0000 mg | INTRAVENOUS | Status: AC | PRN

## 2017-12-07 MED ORDER — CHLORHEXIDINE GLUCONATE CLOTH 2 % EX PADS
6.0000 | MEDICATED_PAD | Freq: Every day | CUTANEOUS | Status: DC
  Administered 2017-12-07 – 2017-12-12 (×6): 6 via TOPICAL

## 2017-12-07 MED ORDER — BISACODYL 10 MG RE SUPP: 10.0000 mg | Freq: Every day | RECTAL | Status: DC

## 2017-12-07 MED ORDER — LACTATED RINGERS IV SOLN: 500.0000 mL | Freq: Once | INTRAVENOUS | Status: DC | PRN

## 2017-12-07 MED ORDER — HEMOSTATIC AGENTS (NO CHARGE) OPTIME
TOPICAL | Status: DC | PRN
  Administered 2017-12-07 (×3): 1 via TOPICAL

## 2017-12-07 MED ORDER — HEMOSTATIC AGENTS (NO CHARGE) OPTIME
TOPICAL | Status: DC | PRN
  Administered 2017-12-07: 1 via TOPICAL

## 2017-12-07 MED ORDER — TRAMADOL HCL 50 MG PO TABS
50.0000 mg | ORAL_TABLET | ORAL | Status: DC | PRN
  Administered 2017-12-08: 50 mg via ORAL
  Filled 2017-12-07: qty 1

## 2017-12-07 MED ORDER — CHLORHEXIDINE GLUCONATE 0.12 % MT SOLN
15.0000 mL | OROMUCOSAL | Status: AC
  Administered 2017-12-07: 15 mL via OROMUCOSAL

## 2017-12-07 MED ORDER — ASPIRIN 81 MG PO CHEW
324.0000 mg | CHEWABLE_TABLET | Freq: Every day | ORAL | Status: DC
  Administered 2017-12-08 – 2017-12-10 (×2): 324 mg
  Filled 2017-12-07 (×2): qty 4

## 2017-12-07 MED ORDER — HEPARIN SODIUM (PORCINE) 1000 UNIT/ML IJ SOLN
INTRAMUSCULAR | Status: DC | PRN
Start: 2017-12-07 — End: 2017-12-07
  Administered 2017-12-07: 2000 [IU] via INTRAVENOUS
  Administered 2017-12-07: 24000 [IU] via INTRAVENOUS
  Administered 2017-12-07: 10000 [IU] via INTRAVENOUS

## 2017-12-07 MED ORDER — SODIUM CHLORIDE 0.45 % IV SOLN: INTRAVENOUS | Status: DC | PRN

## 2017-12-07 MED ORDER — LIDOCAINE HCL (CARDIAC) 20 MG/ML IV SOLN
100.0000 mg | Freq: Once | INTRAVENOUS | Status: AC
  Administered 2017-12-07: 100 mg via INTRAVENOUS

## 2017-12-07 MED ORDER — ROCURONIUM BROMIDE 10 MG/ML (PF) SYRINGE
PREFILLED_SYRINGE | INTRAVENOUS | Status: AC
  Filled 2017-12-07: qty 5

## 2017-12-07 MED ORDER — EPHEDRINE 5 MG/ML INJ
INTRAVENOUS | Status: AC
  Filled 2017-12-07: qty 10

## 2017-12-07 MED ORDER — NOREPINEPHRINE BITARTRATE 1 MG/ML IV SOLN: 0.0000 ug/min | INTRAVENOUS | Status: DC

## 2017-12-07 MED ORDER — NITROGLYCERIN IN D5W 200-5 MCG/ML-% IV SOLN: 0.0000 ug/min | INTRAVENOUS | Status: DC

## 2017-12-07 MED ORDER — SODIUM CHLORIDE 0.9 % IV SOLN
INTRAVENOUS | Status: DC | PRN
  Administered 2017-12-07: 14:00:00 via INTRAVENOUS

## 2017-12-07 MED ORDER — ORAL CARE MOUTH RINSE
15.0000 mL | Freq: Four times a day (QID) | OROMUCOSAL | Status: DC
  Administered 2017-12-08: 15 mL via OROMUCOSAL

## 2017-12-07 MED ORDER — AMIODARONE HCL IN DEXTROSE 360-4.14 MG/200ML-% IV SOLN
30.0000 mg/h | INTRAVENOUS | Status: DC
  Administered 2017-12-07 – 2017-12-08 (×2): 30 mg/h via INTRAVENOUS
  Filled 2017-12-07: qty 200

## 2017-12-07 MED ORDER — MUPIROCIN 2 % EX OINT
TOPICAL_OINTMENT | Freq: Two times a day (BID) | CUTANEOUS | Status: DC
  Administered 2017-12-07 – 2017-12-13 (×12): via NASAL
  Filled 2017-12-07 (×4): qty 22

## 2017-12-07 MED ORDER — PROTAMINE SULFATE 10 MG/ML IV SOLN
INTRAVENOUS | Status: AC
  Filled 2017-12-07: qty 25

## 2017-12-07 MED ORDER — MIDAZOLAM HCL 10 MG/2ML IJ SOLN
INTRAMUSCULAR | Status: AC
  Filled 2017-12-07: qty 2

## 2017-12-07 MED ORDER — PHENYLEPHRINE 40 MCG/ML (10ML) SYRINGE FOR IV PUSH (FOR BLOOD PRESSURE SUPPORT)
PREFILLED_SYRINGE | INTRAVENOUS | Status: AC
  Filled 2017-12-07: qty 10

## 2017-12-07 MED ORDER — SODIUM CHLORIDE 0.9 % IV SOLN: INTRAVENOUS | Status: DC

## 2017-12-07 MED ORDER — ROCURONIUM BROMIDE 10 MG/ML (PF) SYRINGE
PREFILLED_SYRINGE | INTRAVENOUS | Status: DC | PRN
  Administered 2017-12-07 (×2): 50 mg via INTRAVENOUS
  Administered 2017-12-07: 70 mg via INTRAVENOUS
  Administered 2017-12-07: 30 mg via INTRAVENOUS
  Administered 2017-12-07 (×2): 50 mg via INTRAVENOUS

## 2017-12-07 MED ORDER — DOCUSATE SODIUM 100 MG PO CAPS
200.0000 mg | ORAL_CAPSULE | Freq: Every day | ORAL | Status: DC
  Administered 2017-12-09 – 2017-12-11 (×3): 200 mg via ORAL
  Filled 2017-12-07 (×3): qty 2

## 2017-12-07 MED ORDER — 0.9 % SODIUM CHLORIDE (POUR BTL) OPTIME
TOPICAL | Status: DC | PRN
  Administered 2017-12-07: 6000 mL

## 2017-12-07 MED ORDER — METOCLOPRAMIDE HCL 5 MG/ML IJ SOLN
10.0000 mg | Freq: Four times a day (QID) | INTRAMUSCULAR | Status: AC
  Administered 2017-12-07 – 2017-12-11 (×16): 10 mg via INTRAVENOUS
  Filled 2017-12-07 (×16): qty 2

## 2017-12-07 MED ORDER — VANCOMYCIN HCL IN DEXTROSE 750-5 MG/150ML-% IV SOLN
750.0000 mg | Freq: Two times a day (BID) | INTRAVENOUS | Status: AC
  Administered 2017-12-07 – 2017-12-08 (×3): 750 mg via INTRAVENOUS
  Filled 2017-12-07 (×3): qty 150

## 2017-12-07 MED ORDER — ACETAMINOPHEN 500 MG PO TABS
1000.0000 mg | ORAL_TABLET | Freq: Four times a day (QID) | ORAL | Status: AC
  Administered 2017-12-08 – 2017-12-12 (×16): 1000 mg via ORAL
  Filled 2017-12-07 (×16): qty 2

## 2017-12-07 MED ORDER — AMIODARONE HCL IN DEXTROSE 360-4.14 MG/200ML-% IV SOLN: 60.0000 mg/h | INTRAVENOUS | Status: DC

## 2017-12-07 MED ORDER — THROMBIN (RECOMBINANT) 5000 UNITS EX SOLR
CUTANEOUS | Status: DC | PRN
  Administered 2017-12-07: 5000 [IU] via TOPICAL

## 2017-12-07 MED ORDER — SODIUM CHLORIDE 0.9 % IV SOLN
0.4000 ug/kg/h | INTRAVENOUS | Status: DC
  Administered 2017-12-07 – 2017-12-08 (×9): 1.2 ug/kg/h via INTRAVENOUS
  Filled 2017-12-07 (×10): qty 2

## 2017-12-07 MED ORDER — SODIUM CHLORIDE 0.9% FLUSH: 3.0000 mL | INTRAVENOUS | Status: DC | PRN

## 2017-12-07 MED ORDER — MORPHINE SULFATE (PF) 4 MG/ML IV SOLN
2.0000 mg | INTRAVENOUS | Status: DC | PRN
  Administered 2017-12-10: 2 mg via INTRAVENOUS
  Filled 2017-12-07: qty 1

## 2017-12-07 MED ORDER — METOPROLOL TARTRATE 12.5 MG HALF TABLET: 12.5000 mg | ORAL_TABLET | Freq: Two times a day (BID) | ORAL | Status: DC

## 2017-12-07 MED ORDER — MIDAZOLAM HCL 2 MG/2ML IJ SOLN
2.0000 mg | INTRAMUSCULAR | Status: DC | PRN
  Administered 2017-12-07 – 2017-12-08 (×12): 2 mg via INTRAVENOUS
  Filled 2017-12-07 (×13): qty 2

## 2017-12-07 MED ORDER — EPINEPHRINE PF 1 MG/ML IJ SOLN
2.0000 ug/min | INTRAMUSCULAR | Status: AC
  Filled 2017-12-07: qty 4

## 2017-12-07 MED ORDER — BISACODYL 5 MG PO TBEC
10.0000 mg | DELAYED_RELEASE_TABLET | Freq: Every day | ORAL | Status: DC
  Administered 2017-12-09: 10 mg via ORAL
  Filled 2017-12-07: qty 2

## 2017-12-07 MED ORDER — ALBUMIN HUMAN 5 % IV SOLN
250.0000 mL | INTRAVENOUS | Status: AC | PRN
  Administered 2017-12-07 (×2): 250 mL via INTRAVENOUS
  Filled 2017-12-07 (×2): qty 250

## 2017-12-07 MED ORDER — LIDOCAINE 2% (20 MG/ML) 5 ML SYRINGE
INTRAMUSCULAR | Status: AC
  Filled 2017-12-07: qty 5

## 2017-12-07 MED ORDER — NOREPINEPHRINE BITARTRATE 1 MG/ML IV SOLN
2.0000 ug/min | INTRAVENOUS | Status: DC
  Filled 2017-12-07: qty 4

## 2017-12-07 SURGICAL SUPPLY — 101 items
ADAPTER CARDIO PERF ANTE/RETRO (ADAPTER) ×3 IMPLANT
BAG DECANTER FOR FLEXI CONT (MISCELLANEOUS) ×3 IMPLANT
BANDAGE ACE 4X5 VEL STRL LF (GAUZE/BANDAGES/DRESSINGS) ×3 IMPLANT
BANDAGE ACE 6X5 VEL STRL LF (GAUZE/BANDAGES/DRESSINGS) ×3 IMPLANT
BASKET HEART  (ORDER IN 25'S) (MISCELLANEOUS) ×1
BASKET HEART (ORDER IN 25'S) (MISCELLANEOUS) ×1
BASKET HEART (ORDER IN 25S) (MISCELLANEOUS) ×1 IMPLANT
BLADE 11 SAFETY STRL DISP (BLADE) ×3 IMPLANT
BLADE CLIPPER SURG (BLADE) IMPLANT
BLADE STERNUM SYSTEM 6 (BLADE) ×3 IMPLANT
BLADE SURG 12 STRL SS (BLADE) ×3 IMPLANT
BNDG GAUZE ELAST 4 BULKY (GAUZE/BANDAGES/DRESSINGS) ×3 IMPLANT
CANISTER SUCT 3000ML PPV (MISCELLANEOUS) ×3 IMPLANT
CANNULA GUNDRY RCSP 15FR (MISCELLANEOUS) ×3 IMPLANT
CATH CPB KIT VANTRIGT (MISCELLANEOUS) ×3 IMPLANT
CATH ROBINSON RED A/P 18FR (CATHETERS) ×9 IMPLANT
CATH THORACIC 36FR RT ANG (CATHETERS) ×3 IMPLANT
CLIP FOGARTY SPRING 6M (CLIP) ×3 IMPLANT
CLIP TI WIDE RED SMALL 6 (CLIP) ×3 IMPLANT
CLIP VESOCCLUDE SM WIDE 24/CT (CLIP) ×6 IMPLANT
CRADLE DONUT ADULT HEAD (MISCELLANEOUS) ×3 IMPLANT
DERMABOND ADVANCED (GAUZE/BANDAGES/DRESSINGS) ×2
DERMABOND ADVANCED .7 DNX12 (GAUZE/BANDAGES/DRESSINGS) ×1 IMPLANT
DRAIN CHANNEL 32F RND 10.7 FF (WOUND CARE) ×3 IMPLANT
DRAPE CARDIOVASCULAR INCISE (DRAPES) ×2
DRAPE SLUSH/WARMER DISC (DRAPES) ×3 IMPLANT
DRAPE SRG 135X102X78XABS (DRAPES) ×1 IMPLANT
DRSG AQUACEL AG ADV 3.5X14 (GAUZE/BANDAGES/DRESSINGS) ×3 IMPLANT
ELECT BLADE 4.0 EZ CLEAN MEGAD (MISCELLANEOUS) ×3
ELECT BLADE 6.5 EXT (BLADE) ×3 IMPLANT
ELECT CAUTERY BLADE 6.4 (BLADE) ×3 IMPLANT
ELECT REM PT RETURN 9FT ADLT (ELECTROSURGICAL) ×6
ELECTRODE BLDE 4.0 EZ CLN MEGD (MISCELLANEOUS) ×1 IMPLANT
ELECTRODE REM PT RTRN 9FT ADLT (ELECTROSURGICAL) ×2 IMPLANT
FELT TEFLON 1X6 (MISCELLANEOUS) ×3 IMPLANT
GAUZE SPONGE 4X4 12PLY STRL (GAUZE/BANDAGES/DRESSINGS) ×6 IMPLANT
GAUZE SPONGE 4X4 12PLY STRL LF (GAUZE/BANDAGES/DRESSINGS) ×6 IMPLANT
GLOVE BIO SURGEON STRL SZ7.5 (GLOVE) ×9 IMPLANT
GLOVE BIOGEL PI IND STRL 6 (GLOVE) ×3 IMPLANT
GLOVE BIOGEL PI IND STRL 7.0 (GLOVE) ×1 IMPLANT
GLOVE BIOGEL PI INDICATOR 6 (GLOVE) ×6
GLOVE BIOGEL PI INDICATOR 7.0 (GLOVE) ×2
GOWN STRL REUS W/ TWL LRG LVL3 (GOWN DISPOSABLE) ×7 IMPLANT
GOWN STRL REUS W/TWL LRG LVL3 (GOWN DISPOSABLE) ×14
HEMOSTAT POWDER SURGIFOAM 1G (HEMOSTASIS) ×9 IMPLANT
HEMOSTAT SURGICEL 2X14 (HEMOSTASIS) ×3 IMPLANT
INSERT FOGARTY XLG (MISCELLANEOUS) IMPLANT
KIT BASIN OR (CUSTOM PROCEDURE TRAY) ×3 IMPLANT
KIT ROOM TURNOVER OR (KITS) ×3 IMPLANT
KIT SUCTION CATH 14FR (SUCTIONS) ×6 IMPLANT
KIT VASOVIEW HEMOPRO VH 3000 (KITS) ×3 IMPLANT
LEAD PACING MYOCARDI (MISCELLANEOUS) ×3 IMPLANT
MARKER GRAFT CORONARY BYPASS (MISCELLANEOUS) ×12 IMPLANT
NS IRRIG 1000ML POUR BTL (IV SOLUTION) ×15 IMPLANT
PACK E OPEN HEART (SUTURE) ×3 IMPLANT
PACK OPEN HEART (CUSTOM PROCEDURE TRAY) ×3 IMPLANT
PAD ARMBOARD 7.5X6 YLW CONV (MISCELLANEOUS) ×6 IMPLANT
PAD ELECT DEFIB RADIOL ZOLL (MISCELLANEOUS) ×3 IMPLANT
PENCIL BUTTON HOLSTER BLD 10FT (ELECTRODE) ×3 IMPLANT
PUNCH AORTIC ROT 4.0MM RCL 40 (MISCELLANEOUS) ×3 IMPLANT
PUNCH AORTIC ROTATE 4.0MM (MISCELLANEOUS) IMPLANT
PUNCH AORTIC ROTATE 4.5MM 8IN (MISCELLANEOUS) IMPLANT
PUNCH AORTIC ROTATE 5MM 8IN (MISCELLANEOUS) IMPLANT
SET CARDIOPLEGIA MPS 5001102 (MISCELLANEOUS) ×3 IMPLANT
SPONGE LAP 18X18 X RAY DECT (DISPOSABLE) ×9 IMPLANT
SPONGE LAP 4X18 X RAY DECT (DISPOSABLE) ×3 IMPLANT
SURGIFLO W/THROMBIN 8M KIT (HEMOSTASIS) ×3 IMPLANT
SUT BONE WAX W31G (SUTURE) ×3 IMPLANT
SUT MNCRL AB 4-0 PS2 18 (SUTURE) ×6 IMPLANT
SUT PROLENE 3 0 SH DA (SUTURE) IMPLANT
SUT PROLENE 3 0 SH1 36 (SUTURE) IMPLANT
SUT PROLENE 4 0 RB 1 (SUTURE) ×2
SUT PROLENE 4 0 SH DA (SUTURE) ×3 IMPLANT
SUT PROLENE 4-0 RB1 .5 CRCL 36 (SUTURE) ×1 IMPLANT
SUT PROLENE 5 0 C 1 36 (SUTURE) IMPLANT
SUT PROLENE 6 0 C 1 30 (SUTURE) IMPLANT
SUT PROLENE 6 0 CC (SUTURE) ×12 IMPLANT
SUT PROLENE 8 0 BV175 6 (SUTURE) ×9 IMPLANT
SUT PROLENE BLUE 7 0 (SUTURE) ×12 IMPLANT
SUT SILK  1 MH (SUTURE)
SUT SILK 1 MH (SUTURE) IMPLANT
SUT SILK 2 0 SH CR/8 (SUTURE) ×3 IMPLANT
SUT SILK 3 0 SH CR/8 (SUTURE) IMPLANT
SUT STEEL 6MS V (SUTURE) ×3 IMPLANT
SUT STEEL SZ 6 DBL 3X14 BALL (SUTURE) ×3 IMPLANT
SUT VIC AB 1 CTX 36 (SUTURE) ×4
SUT VIC AB 1 CTX36XBRD ANBCTR (SUTURE) ×2 IMPLANT
SUT VIC AB 2-0 CT1 27 (SUTURE) ×4
SUT VIC AB 2-0 CT1 TAPERPNT 27 (SUTURE) ×2 IMPLANT
SUT VIC AB 2-0 CTX 27 (SUTURE) IMPLANT
SUT VIC AB 3-0 X1 27 (SUTURE) IMPLANT
SYSTEM SAHARA CHEST DRAIN ATS (WOUND CARE) ×3 IMPLANT
TAPE CLOTH SURG 4X10 WHT LF (GAUZE/BANDAGES/DRESSINGS) ×3 IMPLANT
TOWEL GREEN STERILE (TOWEL DISPOSABLE) ×3 IMPLANT
TOWEL GREEN STERILE FF (TOWEL DISPOSABLE) ×3 IMPLANT
TOWEL OR 17X24 6PK STRL BLUE (TOWEL DISPOSABLE) IMPLANT
TOWEL OR 17X26 10 PK STRL BLUE (TOWEL DISPOSABLE) IMPLANT
TRAY FOLEY SILVER 16FR TEMP (SET/KITS/TRAYS/PACK) ×3 IMPLANT
TUBING INSUFFLATION (TUBING) ×3 IMPLANT
UNDERPAD 30X30 (UNDERPADS AND DIAPERS) ×3 IMPLANT
WATER STERILE IRR 1000ML POUR (IV SOLUTION) ×6 IMPLANT

## 2017-12-07 NOTE — Progress Notes (Signed)
Pre Procedure note for inpatients:   [name] has been scheduled for Procedure(s): CORONARY ARTERY BYPASS GRAFTING (CABG) (N/A) TRANSESOPHAGEAL ECHOCARDIOGRAM (TEE) (N/A) today. The various methods of treatment have been discussed with the patient. After consideration of the risks, benefits and treatment options the patient has consented to the planned procedure.   The patient has been seen and labs reviewed. There are no changes in the patient's condition to prevent proceeding with the planned procedure today.  Recent labs:  Lab Results  Component Value Date   WBC 8.2 12/07/2017   HGB 12.2 (L) 12/07/2017   HCT 35.9 (L) 12/07/2017   PLT 270 12/07/2017   GLUCOSE 130 (H) 12/07/2017   TRIG 101 11/29/2017   ALT 29 12/07/2017   AST 23 12/07/2017   NA 140 12/07/2017   K 3.5 12/07/2017   CL 108 12/07/2017   CREATININE 1.01 12/07/2017   BUN 10 12/07/2017   CO2 26 12/07/2017   TSH 1.844 12/04/2017   INR 1.08 12/05/2017   HGBA1C 5.0 12/06/2017    Mikey BussingPeter Van Trigt III, MD 12/07/2017 7:40 AM

## 2017-12-07 NOTE — Progress Notes (Signed)
  Echocardiogram Echocardiogram Transesophageal has been performed.  Celene SkeenVijay  Preesha Benjamin 12/07/2017, 9:08 AM

## 2017-12-07 NOTE — Anesthesia Procedure Notes (Signed)
Central Venous Catheter Insertion Performed by: Roderic Palau, MD, anesthesiologist Start/End12/14/2018 7:50 AM, 12/07/2017 8:10 AM Patient location: OR. Preanesthetic checklist: patient identified, IV checked, site marked, risks and benefits discussed, surgical consent, monitors and equipment checked, pre-op evaluation, timeout performed and anesthesia consent Position: Trendelenburg Lidocaine 1% used for infiltration and patient sedated Hand hygiene performed , maximum sterile barriers used  and Seldinger technique used Catheter size: 9 Fr Total catheter length 10. Central line was placed.MAC introducer Swan type:thermodilution Procedure performed without using ultrasound guided technique. Ultrasound Notes:anatomy identified, needle tip was noted to be adjacent to the nerve/plexus identified and no ultrasound evidence of intravascular and/or intraneural injection Attempts: 2 (Attempted L IJ. Unable to pass wire) Following insertion, line sutured, dressing applied and Biopatch. Post procedure assessment: blood return through all ports, free fluid flow and no air  Patient tolerated the procedure well with no immediate complications.

## 2017-12-07 NOTE — Transfer of Care (Signed)
Immediate Anesthesia Transfer of Care Note  Patient: Briant CedarWesley [place]  Procedure(s) Performed: CORONARY ARTERY BYPASS GRAFTING (CABG) times four using left internal mammary artery and right saphenous vein using endoscope for harvest. (N/A Chest) TRANSESOPHAGEAL ECHOCARDIOGRAM (TEE) (N/A Chest)  Patient Location: SICU  Anesthesia Type:General  Level of Consciousness: sedated, unresponsive and Patient remains intubated per anesthesia plan  Airway & Oxygen Therapy: Patient remains intubated per anesthesia plan and Patient placed on Ventilator (see vital sign flow sheet for setting)  Post-op Assessment: Report given to RN and Post -op Vital signs reviewed and stable  Post vital signs: Reviewed and stable  Last Vitals:  Vitals:   12/07/17 0600 12/07/17 0700  BP: (!) 149/97 (!) 152/86  Pulse: 71 (!) 178  Resp: (!) 0 (!) 22  Temp:    SpO2: 95% 96%    Last Pain:  Vitals:   12/07/17 0400  TempSrc: Oral  PainSc:       Patients Stated Pain Goal: 0 (12/04/17 0240)  Complications: No apparent anesthesia complications

## 2017-12-07 NOTE — Anesthesia Procedure Notes (Signed)
Arterial Line Insertion Start/End12/14/2018 7:42 AM, 12/07/2017 7:48 AM Performed by: Adair LaundryPaxton, Lynn A, CRNA, CRNA  Patient location: OR. Preanesthetic checklist: patient identified, IV checked, site marked, risks and benefits discussed, surgical consent, monitors and equipment checked, pre-op evaluation and anesthesia consent Lidocaine 1% used for infiltration and patient sedated Right, radial was placed Catheter size: 20 G Hand hygiene performed  and maximum sterile barriers used   Attempts: 1 Procedure performed without using ultrasound guided technique. Ultrasound Notes:anatomy identified, needle tip was noted to be adjacent to the nerve/plexus identified and no ultrasound evidence of intravascular and/or intraneural injection Following insertion, dressing applied and Biopatch. Post procedure assessment: normal  Patient tolerated the procedure well with no immediate complications.

## 2017-12-07 NOTE — Anesthesia Procedure Notes (Signed)
Procedure Name: Intubation Date/Time: 12/07/2017 8:19 AM Performed by: Nils PyleBell, Makhya Arave T, CRNA Pre-anesthesia Checklist: Patient identified, Emergency Drugs available, Suction available and Patient being monitored Patient Re-evaluated:Patient Re-evaluated prior to induction Oxygen Delivery Method: Circle System Utilized Preoxygenation: Pre-oxygenation with 100% oxygen Induction Type: IV induction Ventilation: Mask ventilation without difficulty Laryngoscope Size: Miller and 2 Grade View: Grade I Tube type: Subglottic suction tube Tube size: 8.0 mm Number of attempts: 1 Airway Equipment and Method: Stylet and Oral airway Placement Confirmation: ETT inserted through vocal cords under direct vision,  positive ETCO2 and breath sounds checked- equal and bilateral Secured at: 21 cm Tube secured with: Tape Dental Injury: Teeth and Oropharynx as per pre-operative assessment

## 2017-12-07 NOTE — Anesthesia Preprocedure Evaluation (Addendum)
Anesthesia Evaluation  Patient identified by MRN, date of birth, ID band Patient awake    Reviewed: Allergy & Precautions, H&P , NPO status , Patient's Chart, lab work & pertinent test results  Airway Mallampati: I  TM Distance: >3 FB Neck ROM: Full    Dental no notable dental hx. (+) Teeth Intact, Dental Advisory Given,    Pulmonary neg pulmonary ROS,    Pulmonary exam normal breath sounds clear to auscultation       Cardiovascular + CAD and + Past MI  negative cardio ROS   Rhythm:Regular Rate:Normal     Neuro/Psych negative neurological ROS  negative psych ROS   GI/Hepatic negative GI ROS, Neg liver ROS,   Endo/Other  negative endocrine ROS  Renal/GU negative Renal ROS  negative genitourinary   Musculoskeletal   Abdominal   Peds  Hematology negative hematology ROS (+)   Anesthesia Other Findings   Reproductive/Obstetrics negative OB ROS                           Anesthesia Physical Anesthesia Plan  ASA: IV  Anesthesia Plan: General   Post-op Pain Management:    Induction: Intravenous  PONV Risk Score and Plan: 3 and Treatment may vary due to age or medical condition and Midazolam  Airway Management Planned: Oral ETT  Additional Equipment: Arterial line, CVP, PA Cath, TEE and 3D TEE  Intra-op Plan:   Post-operative Plan: Post-operative intubation/ventilation  Informed Consent: I have reviewed the patients History and Physical, chart, labs and discussed the procedure including the risks, benefits and alternatives for the proposed anesthesia with the patient or authorized representative who has indicated his/her understanding and acceptance.   Dental advisory given  Plan Discussed with: CRNA  Anesthesia Plan Comments:        Anesthesia Quick Evaluation

## 2017-12-07 NOTE — Anesthesia Postprocedure Evaluation (Signed)
Anesthesia Post Note  Patient: [name]  Procedure(s) Performed: CORONARY ARTERY BYPASS GRAFTING (CABG) times four using left internal mammary artery and right saphenous vein using endoscope for harvest. (N/A Chest) TRANSESOPHAGEAL ECHOCARDIOGRAM (TEE) (N/A Chest)     Patient location during evaluation: SICU Anesthesia Type: General Level of consciousness: sedated Pain management: pain level controlled Vital Signs Assessment: post-procedure vital signs reviewed and stable Respiratory status: patient remains intubated per anesthesia plan Cardiovascular status: stable Postop Assessment: no apparent nausea or vomiting Anesthetic complications: no    Last Vitals:  Vitals:   12/07/17 0700 12/07/17 1435  BP: (!) 152/86 124/60  Pulse: (!) 178 91  Resp: (!) 22 12  Temp:    SpO2: 96% 99%    Last Pain:  Vitals:   12/07/17 0400  TempSrc: Oral  PainSc:                  [name],W. EDMOND

## 2017-12-07 NOTE — Brief Op Note (Signed)
11/29/2017 - 12/07/2017  12:40 PM  PATIENT:  [name]  56 y.o. male  PRE-OPERATIVE DIAGNOSIS:  1. Cardiogenic shock 2. CAD  POST-OPERATIVE DIAGNOSIS:  1. Cardiogenic shock 2. CAD  PROCEDURE:  TRANSESOPHAGEAL ECHOCARDIOGRAM (TEE), CORONARY ARTERY BYPASS GRAFTING (CABG) x 4 (LIMA to LAD, SVG to OM1, SVG to OM2, SVG to RCA) using left internal mammary artery and right greater saphenous vein using endoscope for harvest  SURGEON:  Surgeon(s) and Role:    Kerin PernaVan Trigt, Peter, MD - Primary  PHYSICIAN ASSISTANT: Doree Fudgeonielle Zimmerman PA-C  ASSISTANTS: Benay SpiceMarie Irwin RNFA  ANESTHESIA:   general  DRAINS: Chest tubes placed in the mediastinal and pleural spaces   COUNTS CORRECT:  YES  DICTATION: .Dragon Dictation  PLAN OF CARE: Admit to inpatient   PATIENT DISPOSITION:  ICU - intubated and hemodynamically stable.   Delay start of Pharmacological VTE agent (>24hrs) due to surgical blood loss or risk of bleeding: yes  BASELINE WEIGHT: 67 kg

## 2017-12-07 NOTE — Addendum Note (Signed)
Addendum  created 12/07/17 1513 by Nils PyleBell, Reighan Hipolito T, CRNA   Intraprocedure Flowsheets edited

## 2017-12-07 NOTE — Anesthesia Procedure Notes (Signed)
Central Venous Catheter Insertion Performed by: Gaynelle AduFitzgerald, Burwell Bethel, MD, anesthesiologist Start/End12/14/2018 7:50 AM, 12/07/2017 8:10 AM Patient location: Pre-op. Preanesthetic checklist: patient identified, IV checked, site marked, risks and benefits discussed, surgical consent, monitors and equipment checked, pre-op evaluation, timeout performed and anesthesia consent Hand hygiene performed  and maximum sterile barriers used  PA cath was placed.Swan type:thermodilution PA Cath depth:55 Procedure performed without using ultrasound guided technique. Attempts: 1 Patient tolerated the procedure well with no immediate complications.

## 2017-12-07 NOTE — Progress Notes (Addendum)
Pt had 35 beat of what appears to be a narrow complex VT. During this time Aline went flat having no pulsatility. Pt converted prior to CPR being started. Zoll pads placed on pt and md called. Orders given to give 100mg  lidocaine push, 150mg  amio bolus, and start of lidocaine drip at 2mg /min. Will cont. To monitor and assess pt.

## 2017-12-07 NOTE — Progress Notes (Signed)
Pharmacy Antibiotic Note  [name] is a 56 y.o. male admitted on 11/29/2017.  Patient s/p CABG this am. Orders to continue vancomycin for 48 hours for surgical prophylaxis.  Plan: Vancomycin 750mg  IV q12 hours x 3 doses.  Height: 5\' 10"  (177.8 cm) Weight: 147 lb 11.3 oz (67 kg) IBW/kg (Calculated) : 73  Temp (24hrs), Avg:98.5 F (36.9 C), Min:98.3 F (36.8 C), Max:98.7 F (37.1 C)  Recent Labs  Lab 12/03/17 0354 12/04/17 0331 12/05/17 0451 12/06/17 0503 12/07/17 0400  WBC 7.9 9.3 7.8 7.4 8.2  CREATININE 0.86 0.94 1.00 1.04 1.01    Estimated Creatinine Clearance: 79.2 mL/min (by C-G formula based on SCr of 1.01 mg/dL).    No Known Allergies   Sheppard CoilFrank Wilson PharmD., BCPS Clinical Pharmacist Pager [phone] 12/07/2017 2:56 PM

## 2017-12-08 ENCOUNTER — Inpatient Hospital Stay (HOSPITAL_COMMUNITY): Payer: Medicaid Other

## 2017-12-08 LAB — GLUCOSE, CAPILLARY
GLUCOSE-CAPILLARY: 114 mg/dL — AB (ref 65–99)
GLUCOSE-CAPILLARY: 123 mg/dL — AB (ref 65–99)
GLUCOSE-CAPILLARY: 126 mg/dL — AB (ref 65–99)
GLUCOSE-CAPILLARY: 128 mg/dL — AB (ref 65–99)
GLUCOSE-CAPILLARY: 129 mg/dL — AB (ref 65–99)
GLUCOSE-CAPILLARY: 129 mg/dL — AB (ref 65–99)
GLUCOSE-CAPILLARY: 130 mg/dL — AB (ref 65–99)
GLUCOSE-CAPILLARY: 131 mg/dL — AB (ref 65–99)
GLUCOSE-CAPILLARY: 137 mg/dL — AB (ref 65–99)
GLUCOSE-CAPILLARY: 159 mg/dL — AB (ref 65–99)
GLUCOSE-CAPILLARY: 161 mg/dL — AB (ref 65–99)
Glucose-Capillary: 122 mg/dL — ABNORMAL HIGH (ref 65–99)
Glucose-Capillary: 130 mg/dL — ABNORMAL HIGH (ref 65–99)
Glucose-Capillary: 148 mg/dL — ABNORMAL HIGH (ref 65–99)
Glucose-Capillary: 120 mg/dL — ABNORMAL HIGH (ref 65–99)
Glucose-Capillary: 133 mg/dL — ABNORMAL HIGH (ref 65–99)
Glucose-Capillary: 136 mg/dL — ABNORMAL HIGH (ref 65–99)
Glucose-Capillary: 117 mg/dL — ABNORMAL HIGH (ref 65–99)
Glucose-Capillary: 131 mg/dL — ABNORMAL HIGH (ref 65–99)
Glucose-Capillary: 99 mg/dL (ref 65–99)

## 2017-12-08 LAB — POCT I-STAT, CHEM 8
BUN: 9 mg/dL (ref 6–20)
BUN: 8 mg/dL (ref 6–20)
BUN: 6 mg/dL (ref 6–20)
BUN: 8 mg/dL (ref 6–20)
BUN: 6 mg/dL (ref 6–20)
BUN: 7 mg/dL (ref 6–20)
BUN: 7 mg/dL (ref 6–20)
CALCIUM ION: 1.09 mmol/L — AB (ref 1.15–1.40)
CALCIUM ION: 1.07 mmol/L — AB (ref 1.15–1.40)
CHLORIDE: 108 mmol/L (ref 101–111)
CHLORIDE: 104 mmol/L (ref 101–111)
CHLORIDE: 104 mmol/L (ref 101–111)
CREATININE: 0.8 mg/dL (ref 0.61–1.24)
CREATININE: 0.5 mg/dL — AB (ref 0.61–1.24)
CREATININE: 0.4 mg/dL — AB (ref 0.61–1.24)
CREATININE: 0.6 mg/dL — AB (ref 0.61–1.24)
Calcium, Ion: 1.27 mmol/L (ref 1.15–1.40)
Calcium, Ion: 1.22 mmol/L (ref 1.15–1.40)
Calcium, Ion: 0.9 mmol/L — ABNORMAL LOW (ref 1.15–1.40)
Calcium, Ion: 0.91 mmol/L — ABNORMAL LOW (ref 1.15–1.40)
Calcium, Ion: 1.2 mmol/L (ref 1.15–1.40)
Chloride: 107 mmol/L (ref 101–111)
Chloride: 103 mmol/L (ref 101–111)
Chloride: 104 mmol/L (ref 101–111)
Chloride: 103 mmol/L (ref 101–111)
Creatinine, Ser: 0.7 mg/dL (ref 0.61–1.24)
Creatinine, Ser: 0.6 mg/dL — ABNORMAL LOW (ref 0.61–1.24)
Creatinine, Ser: 0.8 mg/dL (ref 0.61–1.24)
GLUCOSE: 102 mg/dL — AB (ref 65–99)
GLUCOSE: 111 mg/dL — AB (ref 65–99)
GLUCOSE: 137 mg/dL — AB (ref 65–99)
GLUCOSE: 137 mg/dL — AB (ref 65–99)
GLUCOSE: 167 mg/dL — AB (ref 65–99)
GLUCOSE: 120 mg/dL — AB (ref 65–99)
Glucose, Bld: 93 mg/dL (ref 65–99)
HCT: 32 % — ABNORMAL LOW (ref 39.0–52.0)
HCT: 23 % — ABNORMAL LOW (ref 39.0–52.0)
HCT: 28 % — ABNORMAL LOW (ref 39.0–52.0)
HCT: 27 % — ABNORMAL LOW (ref 39.0–52.0)
HCT: 29 % — ABNORMAL LOW (ref 39.0–52.0)
HEMATOCRIT: 30 % — AB (ref 39.0–52.0)
HEMATOCRIT: 26 % — AB (ref 39.0–52.0)
HEMOGLOBIN: 10.2 g/dL — AB (ref 13.0–17.0)
HEMOGLOBIN: 9.2 g/dL — AB (ref 13.0–17.0)
HEMOGLOBIN: 9.9 g/dL — AB (ref 13.0–17.0)
Hemoglobin: 10.9 g/dL — ABNORMAL LOW (ref 13.0–17.0)
Hemoglobin: 7.8 g/dL — ABNORMAL LOW (ref 13.0–17.0)
Hemoglobin: 9.5 g/dL — ABNORMAL LOW (ref 13.0–17.0)
Hemoglobin: 8.8 g/dL — ABNORMAL LOW (ref 13.0–17.0)
POTASSIUM: 3.7 mmol/L (ref 3.5–5.1)
POTASSIUM: 3.8 mmol/L (ref 3.5–5.1)
POTASSIUM: 3.7 mmol/L (ref 3.5–5.1)
POTASSIUM: 3.6 mmol/L (ref 3.5–5.1)
Potassium: 3.6 mmol/L (ref 3.5–5.1)
Potassium: 4.5 mmol/L (ref 3.5–5.1)
Potassium: 4.5 mmol/L (ref 3.5–5.1)
SODIUM: 144 mmol/L (ref 135–145)
SODIUM: 141 mmol/L (ref 135–145)
Sodium: 141 mmol/L (ref 135–145)
Sodium: 143 mmol/L (ref 135–145)
Sodium: 137 mmol/L (ref 135–145)
Sodium: 138 mmol/L (ref 135–145)
Sodium: 143 mmol/L (ref 135–145)
TCO2: 29 mmol/L (ref 22–32)
TCO2: 23 mmol/L (ref 22–32)
TCO2: 26 mmol/L (ref 22–32)
TCO2: 28 mmol/L (ref 22–32)
TCO2: 24 mmol/L (ref 22–32)
TCO2: 23 mmol/L (ref 22–32)
TCO2: 21 mmol/L — ABNORMAL LOW (ref 22–32)

## 2017-12-08 LAB — CBC
HCT: 30.1 % — ABNORMAL LOW (ref 39.0–52.0)
HEMATOCRIT: 29.7 % — AB (ref 39.0–52.0)
Hemoglobin: 10.4 g/dL — ABNORMAL LOW (ref 13.0–17.0)
Hemoglobin: 10.3 g/dL — ABNORMAL LOW (ref 13.0–17.0)
MCH: 32 pg (ref 26.0–34.0)
MCH: 31.6 pg (ref 26.0–34.0)
MCHC: 35 g/dL (ref 30.0–36.0)
MCHC: 34.2 g/dL (ref 30.0–36.0)
MCV: 91.4 fL (ref 78.0–100.0)
MCV: 92.3 fL (ref 78.0–100.0)
PLATELETS: 162 10*3/uL (ref 150–400)
PLATELETS: 162 10*3/uL (ref 150–400)
RBC: 3.25 MIL/uL — ABNORMAL LOW (ref 4.22–5.81)
RBC: 3.26 MIL/uL — AB (ref 4.22–5.81)
RDW: 13.3 % (ref 11.5–15.5)
RDW: 13.3 % (ref 11.5–15.5)
WBC: 8.8 10*3/uL (ref 4.0–10.5)
WBC: 10.3 10*3/uL (ref 4.0–10.5)

## 2017-12-08 LAB — POCT I-STAT 3, ART BLOOD GAS (G3+)
ACID-BASE DEFICIT: 1 mmol/L (ref 0.0–2.0)
ACID-BASE DEFICIT: 3 mmol/L — AB (ref 0.0–2.0)
ACID-BASE DEFICIT: 1 mmol/L (ref 0.0–2.0)
Acid-base deficit: 3 mmol/L — ABNORMAL HIGH (ref 0.0–2.0)
Acid-base deficit: 3 mmol/L — ABNORMAL HIGH (ref 0.0–2.0)
BICARBONATE: 24.1 mmol/L (ref 20.0–28.0)
BICARBONATE: 22 mmol/L (ref 20.0–28.0)
BICARBONATE: 21.7 mmol/L (ref 20.0–28.0)
Bicarbonate: 24.7 mmol/L (ref 20.0–28.0)
Bicarbonate: 21.1 mmol/L (ref 20.0–28.0)
Bicarbonate: 21.4 mmol/L (ref 20.0–28.0)
O2 SAT: 100 %
O2 SAT: 100 %
O2 SAT: 93 %
O2 SAT: 97 %
O2 Saturation: 98 %
O2 Saturation: 100 %
PCO2 ART: 35.4 mmHg (ref 32.0–48.0)
PCO2 ART: 35.1 mmHg (ref 32.0–48.0)
PH ART: 7.441 (ref 7.350–7.450)
PO2 ART: 372 mmHg — AB (ref 83.0–108.0)
TCO2: 26 mmol/L (ref 22–32)
TCO2: 25 mmol/L (ref 22–32)
TCO2: 23 mmol/L (ref 22–32)
TCO2: 23 mmol/L (ref 22–32)
TCO2: 22 mmol/L (ref 22–32)
TCO2: 22 mmol/L (ref 22–32)
pCO2 arterial: 43 mmHg (ref 32.0–48.0)
pCO2 arterial: 38.7 mmHg (ref 32.0–48.0)
pCO2 arterial: 30 mmHg — ABNORMAL LOW (ref 32.0–48.0)
pCO2 arterial: 33.1 mmHg (ref 32.0–48.0)
pH, Arterial: 7.367 (ref 7.350–7.450)
pH, Arterial: 7.364 (ref 7.350–7.450)
pH, Arterial: 7.474 — ABNORMAL HIGH (ref 7.350–7.450)
pH, Arterial: 7.414 (ref 7.350–7.450)
pH, Arterial: 7.396 (ref 7.350–7.450)
pO2, Arterial: 405 mmHg — ABNORMAL HIGH (ref 83.0–108.0)
pO2, Arterial: 105 mmHg (ref 83.0–108.0)
pO2, Arterial: 68 mmHg — ABNORMAL LOW (ref 83.0–108.0)
pO2, Arterial: 91 mmHg (ref 83.0–108.0)
pO2, Arterial: 226 mmHg — ABNORMAL HIGH (ref 83.0–108.0)

## 2017-12-08 LAB — BASIC METABOLIC PANEL
ANION GAP: 8 (ref 5–15)
BUN: 8 mg/dL (ref 6–20)
CALCIUM: 8 mg/dL — AB (ref 8.9–10.3)
CO2: 21 mmol/L — ABNORMAL LOW (ref 22–32)
CREATININE: 0.92 mg/dL (ref 0.61–1.24)
Chloride: 109 mmol/L (ref 101–111)
Glucose, Bld: 130 mg/dL — ABNORMAL HIGH (ref 65–99)
Potassium: 4.1 mmol/L (ref 3.5–5.1)
SODIUM: 138 mmol/L (ref 135–145)

## 2017-12-08 LAB — URINALYSIS, ROUTINE W REFLEX MICROSCOPIC
BILIRUBIN URINE: NEGATIVE
Glucose, UA: NEGATIVE mg/dL
Hgb urine dipstick: NEGATIVE
KETONES UR: NEGATIVE mg/dL
LEUKOCYTES UA: NEGATIVE
NITRITE: NEGATIVE
PH: 5 (ref 5.0–8.0)
PROTEIN: NEGATIVE mg/dL
Specific Gravity, Urine: 1.025 (ref 1.005–1.030)

## 2017-12-08 LAB — CREATININE, SERUM: Creatinine, Ser: 0.87 mg/dL (ref 0.61–1.24)

## 2017-12-08 LAB — POCT ACTIVATED CLOTTING TIME: Activated Clotting Time: 142 seconds

## 2017-12-08 LAB — MAGNESIUM
MAGNESIUM: 2.2 mg/dL (ref 1.7–2.4)
MAGNESIUM: 2.1 mg/dL (ref 1.7–2.4)

## 2017-12-08 LAB — PROTIME-INR
INR: 1.5
Prothrombin Time: 18 seconds — ABNORMAL HIGH (ref 11.4–15.2)

## 2017-12-08 LAB — COOXEMETRY PANEL
CARBOXYHEMOGLOBIN: 0.8 % (ref 0.5–1.5)
METHEMOGLOBIN: 1.6 % — AB (ref 0.0–1.5)
O2 Saturation: 66.7 %
TOTAL HEMOGLOBIN: 10.6 g/dL — AB (ref 12.0–16.0)

## 2017-12-08 LAB — APTT: aPTT: 39 seconds — ABNORMAL HIGH (ref 24–36)

## 2017-12-08 MED ORDER — CHLORHEXIDINE GLUCONATE 0.12% ORAL RINSE (MEDLINE KIT): 15.0000 mL | Freq: Two times a day (BID) | OROMUCOSAL | Status: DC

## 2017-12-08 MED ORDER — AMIODARONE HCL IN DEXTROSE 360-4.14 MG/200ML-% IV SOLN
30.0000 mg/h | INTRAVENOUS | Status: DC
  Administered 2017-12-08 – 2017-12-09 (×2): 30 mg/h via INTRAVENOUS
  Filled 2017-12-08 (×2): qty 200

## 2017-12-08 MED ORDER — ORAL CARE MOUTH RINSE
15.0000 mL | OROMUCOSAL | Status: DC
  Administered 2017-12-08 (×8): 15 mL via OROMUCOSAL

## 2017-12-08 MED ORDER — ORAL CARE MOUTH RINSE: 15.0000 mL | Freq: Four times a day (QID) | OROMUCOSAL | Status: DC

## 2017-12-08 MED ORDER — CHLORHEXIDINE GLUCONATE 0.12 % MT SOLN
15.0000 mL | Freq: Two times a day (BID) | OROMUCOSAL | Status: DC
  Administered 2017-12-09 – 2017-12-12 (×6): 15 mL via OROMUCOSAL
  Filled 2017-12-08 (×6): qty 15

## 2017-12-08 MED ORDER — POTASSIUM CHLORIDE 10 MEQ/50ML IV SOLN
10.0000 meq | INTRAVENOUS | Status: AC
  Administered 2017-12-08 (×3): 10 meq via INTRAVENOUS
  Filled 2017-12-08 (×3): qty 50

## 2017-12-08 MED ORDER — INSULIN ASPART 100 UNIT/ML ~~LOC~~ SOLN
0.0000 [IU] | SUBCUTANEOUS | Status: DC
  Administered 2017-12-09 – 2017-12-10 (×5): 2 [IU] via SUBCUTANEOUS

## 2017-12-08 MED ORDER — CHLORHEXIDINE GLUCONATE 0.12% ORAL RINSE (MEDLINE KIT)
15.0000 mL | Freq: Two times a day (BID) | OROMUCOSAL | Status: DC
  Administered 2017-12-08 – 2017-12-10 (×5): 15 mL via OROMUCOSAL

## 2017-12-08 MED ORDER — FUROSEMIDE 10 MG/ML IJ SOLN
20.0000 mg | Freq: Once | INTRAMUSCULAR | Status: AC
Start: 2017-12-08 — End: 2017-12-08
  Administered 2017-12-08: 20 mg via INTRAVENOUS

## 2017-12-08 MED ORDER — ORAL CARE MOUTH RINSE
15.0000 mL | Freq: Two times a day (BID) | OROMUCOSAL | Status: DC
  Administered 2017-12-09 – 2017-12-12 (×3): 15 mL via OROMUCOSAL

## 2017-12-08 MED ORDER — DIGOXIN 125 MCG PO TABS
0.1250 mg | ORAL_TABLET | Freq: Every day | ORAL | Status: DC
  Administered 2017-12-08 – 2017-12-13 (×6): 0.125 mg via ORAL
  Filled 2017-12-08 (×6): qty 1

## 2017-12-08 MED ORDER — AMIODARONE HCL IN DEXTROSE 360-4.14 MG/200ML-% IV SOLN
60.0000 mg/h | INTRAVENOUS | Status: AC
  Administered 2017-12-08: 60 mg/h via INTRAVENOUS
  Filled 2017-12-08: qty 200

## 2017-12-08 NOTE — Progress Notes (Signed)
Nutrition Follow-up  DOCUMENTATION CODES:   Not applicable  INTERVENTION:    If TF restarted, rec Vital AF 1.2 formula at goal rate of 60 ml/hr  Provides 1728 kcals, 108 gm protein, 1168 ml of free water daily  NUTRITION DIAGNOSIS:   Inadequate oral intake related to acute illness as evidenced by NPO status, ongoing  GOAL:   Patient will meet greater than or equal to 90% of their needs, currently unmet  MONITOR:   PO intake, Supplement acceptance, Labs, Weight trends, I & O's  ASSESSMENT:   56 yo male with no known PMH admitted after he collapsed chopping firewood. Pt with VF arrest, VDRF post arrest requiring intubation, RUL collapse   12/09 Self-Extubated 12/10 IABP removed and had recurrent VF arrest, IABP replaced  Patient is currently intubated on ventilator support  MV: 10.8 L/min Temp (24hrs), Avg:100 F (37.8 C), Min:96.1 F (35.6 C), Max:102 F (38.9 C)  OGT in place  Pt s/p CABG procedure 12/14. Prior to intubation was on a Heart Healthy diet.  PO intake was 50-75% and she was taking her Prostat supplements.  Pt's weight has been stable. Labs and medications reviewed. CBG's [id number].  Diet Order:  Diet NPO time specified  EDUCATION NEEDS:   Education needs have been addressed  Skin:  Skin Assessment: Reviewed RN Assessment  Last BM:  12/13  Height:   Ht Readings from Last 1 Encounters:  12/06/17 5\' 10"  (1.778 m)   Weight:   Wt Readings from Last 1 Encounters:  12/08/17 158 lb 1.1 oz (71.7 kg)   Ideal Body Weight:  72.7 kg  BMI:  Body mass index is 22.68 kg/m.  Estimated Nutritional Needs:   Kcal:  1962  Protein:  105-120 gm  Fluid:  >/= 1.7 L  Maureen ChattersKatie Everlene Cunning, RD, LDN Pager #: [phone] After-Hours Pager #: [phone]

## 2017-12-08 NOTE — Progress Notes (Addendum)
Patient ID: [name], male   DOB: October 02, 1961, 56 y.o.   MRN: [id number]    Advanced Heart Failure Progress   Primary Cardiologist:  New to [facility] (Dr. Saunders Revel) Primary HF: New (Dr. Haroldine Laws)   HPI:    IABP removed 12/10 and he had recurrent VF arrest. IABP replaced.  CABG 12/14.    Yesterday evening had 35 beats NSVT with transient loss of pulse, no CPR.  Lidocaine gtt started.   IABP 1:3 since last night.  SBP 100-110s, hemodynamics below.  He remains on norepinephrine 2, epinephrine 1, milrinone 0.25, lidocaine 2, amiodarone 30.    Fever to 101 overnight. CXR without infiltrate.   Intubated/sedated  Swan numbers CVP 7 PA 28/4 CI 2.1 Co-ox 66%  Echo: EF ~30% RV normal.   LHC 11/29/17 Left Main  Dist LM lesion 40% stenosed  Left Anterior Descending  Prox LAD-1 lesion is 60% stenosed. The lesion is calcified.  Prox LAD-2 lesion 80% stenosed  First Diagonal Branch  Vessel is small in size.  Ost 1st Diag to 1st Diag lesion 70% stenosed  Second Diagonal Branch  Vessel is small in size. There is mild disease in the vessel.  Left Circumflex  Ost Cx lesion 30% stenosed  Prox Cx lesion is 80% stenosed. The lesion is calcified.  Prox Cx to Mid Cx lesion 80% stenosed  First Obtuse Marginal Branch  Vessel is small in size.  Ost 1st Mrg lesion 90% stenosed  Second Obtuse Marginal Branch  Ost 2nd Mrg to 2nd Mrg lesion 60% stenosed  Right Coronary Artery  Collaterals  Mid RCA filled by collaterals from Prox RCA.    Prox RCA to Mid RCA lesion is 100% stenosed. The lesion is chronically occluded with bridging and left-to-right collateral flow.  Right Posterior Descending Artery  Collaterals  RPDA filled by collaterals from 2nd Sept.     Objective:    Vital Signs:   Temp:  [96.1 F (35.6 C)-102 F (38.9 C)] 100.9 F (38.3 C) (12/15 0900) Pulse Rate:  [24-186] 57 (12/15 0900) Resp:  [0-39] 12 (12/15 0900) BP: (98-141)/(57-105) 119/78 (12/15 0900) SpO2:  [89  %-100 %] 100 % (12/15 0900) Arterial Line BP: (74-160)/(35-74) 141/57 (12/15 0900) FiO2 (%):  [50 %] 50 % (12/15 0815) Weight:  [158 lb 1.1 oz (71.7 kg)] 158 lb 1.1 oz (71.7 kg) (12/15 0439) Last BM Date: 12/06/17  Weight change: Filed Weights   12/06/17 0500 12/07/17 0500 12/08/17 0439  Weight: 154 lb 15.7 oz (70.3 kg) 147 lb 11.3 oz (67 kg) 158 lb 1.1 oz (71.7 kg)    Intake/Output:   Intake/Output Summary (Last 24 hours) at 12/08/2017 1040 Last data filed at 12/08/2017 0900 Gross per 24 hour  Intake 4883.47 ml  Output 4330 ml  Net 553.47 ml      Physical Exam   CVP 7 General: intubated/sedated Neck: No JVD, no thyromegaly or thyroid nodule. Swan in place Lungs: Clear anteriorly. CV: Nondisplaced PMI.  Heart regular S1/S2, no S3/S4, no murmur.  No peripheral edema.   Abdomen: Soft, nontender, no hepatosplenomegaly, no distention.  Skin: Intact without lesions or rashes.  Neurologic: Alert and oriented x 3.  Psych: Normal affect. Extremities: No clubbing or cyanosis. Right groin IABP.  HEENT: Normal.    Telemetry   NSR 80s (personally reviewed)   Labs   Basic Metabolic Panel: Recent Labs  Lab 12/01/17 1805  12/02/17 1594 12/03/17 0052 12/03/17 0354 12/04/17 0331 12/05/17 0451 12/06/17 0503 12/07/17 0400 12/07/17 1455  12/07/17 2033 12/07/17 2045 12/08/17 0411  NA  --    < > 139 138 137 138 139 137 140 145  --  143 138  K  --    < > 3.5 4.1 4.2 4.1 3.6 3.6 3.5 3.6  --  4.5 4.1  CL  --    < > 112* 107 106 106 107 105 108  --   --  108 109  CO2  --    < > 20* _0 --   --   --  21*  GLUCOSE  --    < > 134* 105* 105* 108* 110* 124* 130* 136*  --  115* 130*  BUN  --    < > 5* 8 5* _1 --   --  8 8  CREATININE  --    < > 0.93 0.98 0.86 0.94 1.00 1.04 1.01  --  0.85 0.70 0.92  CALCIUM  --    < > 8.0* 8.4* 8.5* 8.5* 8.4* 8.6* 8.8*  --   --   --  8.0*  MG 1.9  --  1.8 2.2 1.7 2.1  --   --   --   --  2.8*  --  2.2  PHOS 2.7  --  2.5  --   3.7 3.0  --   --   --   --   --   --   --    < > = values in this interval not displayed.    Liver Function Tests: Recent Labs  Lab 12/05/17 0451 12/06/17 0503 12/07/17 0400  AST _2 ALT _3 ALKPHOS 30* 32* 40  BILITOT 0.8 0.6 0.7  PROT 5.2* 5.6* 5.6*  ALBUMIN 2.7* 2.8* 3.0*   No results for input(s): LIPASE, AMYLASE in the last 168 hours. No results for input(s): AMMONIA in the last 168 hours.  CBC: Recent Labs  Lab 12/06/17 0503 12/07/17 0400 12/07/17 1225 12/07/17 1455 12/07/17 1502 12/07/17 2033 12/07/17 2045 12/08/17 0411  WBC 7.4 8.2  --   --  15.7* 9.2  --  8.8  HGB 11.6* 12.2* 9.5* 9.9* 11.2* 10.4* 9.5* 10.4*  HCT 33.6* 35.9* 27.6* 29.0* 32.5* 30.4* 28.0* 29.7*  MCV 92.3 92.5  --   --  91.0 90.7  --  91.4  PLT 202 270 172  --  161 152  --  162    Cardiac Enzymes: No results for input(s): CKTOTAL, CKMB, CKMBINDEX, TROPONINI in the last 168 hours.  BNP: BNP (last 3 results) No results for input(s): BNP in the last 8760 hours.  ProBNP (last 3 results) No results for input(s): PROBNP in the last 8760 hours.   CBG: Recent Labs  Lab 12/08/17 0603 12/08/17 0650 12/08/17 0751 12/08/17 0901 12/08/17 0957  GLUCAP 148* 120* 133* 159* 136*    Coagulation Studies: Recent Labs    12/07/17 1502 12/08/17 0411  LABPROT 17.8* 18.0*  INR 1.48 1.50    Imaging   Dg Chest Port 1 View  Result Date: 12/08/2017 CLINICAL DATA:  Status post coronary bypass graft EXAM: PORTABLE CHEST 1 VIEW COMPARISON:  12/07/2017 FINDINGS: Cardiac shadow is enlarged but stable. Postoperative changes are again seen. Swan-Ganz catheter is noted in the pulmonary outflow tract. Endotracheal tube, nasogastric catheter, mediastinal drain and left thoracostomy catheter are again noted and stable. The lungs are well aerated bilaterally. No pneumothorax is seen. Left-sided PICC line is also  noted in satisfactory position. Multiple rib fractures are noted particularly on the  right. IMPRESSION: Tubes and lines as described above. No acute abnormality noted. Electronically Signed   By: Inez Catalina M.D.   On: 12/08/2017 07:28   Dg Chest Port 1 View  Result Date: 12/07/2017 CLINICAL DATA:  Coronary artery disease.  Status post CABG. EXAM: PORTABLE CHEST 1 VIEW COMPARISON:  12/06/2017 FINDINGS: Endotracheal tube, NG tube, Swan-Ganz catheter, PICC and chest tubes in place. No pneumothorax. CABG. Heart size and vascularity are normal. Minimal atelectasis at the left lung base. Multiple old rib fractures, old left clavicle fracture, and old left scapula fracture. IMPRESSION: Minimal left base atelectasis. Tubes and lines appear in good position. No pneumothorax. Electronically Signed   By: Lorriane Shire M.D.   On: 12/07/2017 15:27     Medications:     Current Medications: . acetaminophen  1,000 mg Oral Q6H   Or  . acetaminophen (TYLENOL) oral liquid 160 mg/5 mL  1,000 mg Per Tube Q6H  . aspirin EC  325 mg Oral Daily   Or  . aspirin  324 mg Per Tube Daily  . atorvastatin  80 mg Oral q1800  . bisacodyl  10 mg Oral Daily   Or  . bisacodyl  10 mg Rectal Daily  . chlorhexidine gluconate (MEDLINE KIT)  15 mL Mouth Rinse BID  . Chlorhexidine Gluconate Cloth  6 each Topical Daily  . docusate sodium  200 mg Oral Daily  . feeding supplement (ENSURE ENLIVE)  237 mL Oral TID BM  . feeding supplement (PRO-STAT SUGAR FREE 64)  30 mL Oral BID WC  . folic acid  1 mg Oral Daily  . insulin regular  0-10 Units Intravenous TID WC  . levalbuterol  0.63 mg Nebulization TID  . mouth rinse  15 mL Mouth Rinse 10 times per day  . metoCLOPramide (REGLAN) injection  10 mg Intravenous Q6H  . mupirocin ointment   Nasal BID  . [START ON 12/09/2017] pantoprazole  40 mg Oral Daily  . sodium chloride flush  3 mL Intravenous Q12H  . thiamine  100 mg Oral Daily    Infusions: . sodium chloride    . sodium chloride    . sodium chloride    . albumin human    . amiodarone 30 mg/hr  (12/08/17 0800)  . cefUROXime (ZINACEF)  IV Stopped (12/08/17 0551)  . dexmedetomidine (PRECEDEX) IV infusion 1.2 mcg/kg/hr (12/08/17 0930)  . EPINEPHrine 4 mg in dextrose 5% 250 mL infusion (16 mcg/mL) 1 mcg/min (12/08/17 0800)  . insulin (NOVOLIN-R) infusion 4 Units/hr (12/08/17 0900)  . lactated ringers 10 mL/hr at 12/08/17 0800  . lactated ringers 10 mL/hr at 12/08/17 0800  . lidocaine 2 mg/min (12/08/17 0800)  . milrinone 0.25 mcg/kg/min (12/08/17 0800)  . nitroGLYCERIN Stopped (12/07/17 1900)  . norepinephrine (LEVOPHED) Adult infusion 2 mcg/min (12/08/17 0800)  . phenylephrine (NEO-SYNEPHRINE) Adult infusion Stopped (12/07/17 1520)  . vancomycin Stopped (12/08/17 0900)    Patient Profile   [name] is a 56 y.o. male with unknown medical history who presented to [facility] 11/29/17 after VT/VF arrest. Intubated emergently in ER. Chest CT with RUL collapse. Pt underwent bronch and taken emergently to cath lab.  Cath showed Severe 3v CAD and elevated LVEDP. IABP placed and started on milrinone with low output CHF.   On 12/10 IABP removed and had recurrent VF. IABP replaced.  Assessment/Plan   1. Cardiogenic shock: EF 25-30% by echo due to iCM.  S/p CABG 12/14.  This morning, good co-ox and adequate CI by Luiz Blare.  He is on epinephrine at 1 and norepinephrine at 2 with milrinone 0.25. Stable BP.  IABP has been weaned to 1:3.  - IABP 1:3 and heparin off.  Will remove IABP today.  - With VT, d/c low dose epinephrine.  - Continue norepinephrine gtt and milrinone 0.25 for now.  - CVP 7, will not diurese.  - Can restart digoxin 0.125.  2. CAD: Severe 3vD as above.  S/p CABG on 12/14.  - Continue ASA/statin.  3. Acute respiratory failure with RUL collapse and aspiration PNA by CT: Vent post-CABG.  4. Severe ETOH abuse 5. VT: NSVT last night, started lidocaine.  - Will stop epinephrine (only on low dose).  - Will stop lidocaine at this point and reload with amiodarone => 60 mg/hr x 6  hrs when 30 mg/hr.  6. ID: Fever 101. Will send blood cultures, urine culture, UA.  CXR ok.   CRITICAL CARE Performed by: Loralie Champagne  Total critical care time: 35  minutes  Critical care time was exclusive of separately billable procedures and treating other patients.  Critical care was necessary to treat or prevent imminent or life-threatening deterioration.  Critical care was time spent personally by me on the following activities: development of treatment plan with patient and/or surrogate as well as nursing, discussions with consultants, evaluation of patient's response to treatment, examination of patient, obtaining history from patient or surrogate, ordering and performing treatments and interventions, ordering and review of laboratory studies, ordering and review of radiographic studies, pulse oximetry and re-evaluation of patient's condition. Length of Stay: [place], MD  12/08/2017, 10:40 AM  Advanced Heart Failure Team Pager [phone] (M-F; 7a - 4p)  Please contact [facility] for night-coverage after hours (4p -7a ) and weekends on [email or web address]

## 2017-12-08 NOTE — Procedures (Signed)
Extubation Procedure Note  Patient Details:   Name: [name] DOB: 10/08/1961 MRN: [id number]   Airway Documentation:     Evaluation  O2 sats: stable throughout Complications: No apparent complications Patient did tolerate procedure well. Bilateral Breath Sounds: Rhonchi   Yes   Patient tolerated rapid wean. Positive for cuff leak. NIF greater than -40 and VC 1.6L. Patient extubated to a 4 lpm nasal cannula. No signs of dyspnea or stridor noted. Patient instructed on the Incentive Spirometer achieving 1100 mL three times. RN at bedside.   Ancil BoozerSmallwood, Gladyse Corvin 12/08/2017, 7:16 PM

## 2017-12-08 NOTE — Progress Notes (Addendum)
[facility] DAILY ICU PROGRESS NOTE                   [address]            [place] [address]          [phone]   1 Day Post-Op Procedure(s) (LRB): CORONARY ARTERY BYPASS GRAFTING (CABG) times four using left internal mammary artery and right saphenous vein using endoscope for harvest. (N/A) TRANSESOPHAGEAL ECHOCARDIOGRAM (TEE) (N/A)  Total Length of Stay:  LOS: 9 days   Subjective: Patient awake and alert. He states he is doing "good".  Objective: Vital signs in last 24 hours: Temp:  [99.1 F (37.3 C)-102 F (38.9 C)] 99.7 F (37.6 C) (12/15 1900) Pulse Rate:  [24-118] 68 (12/15 1900) Cardiac Rhythm: Normal sinus rhythm (12/15 1200) Resp:  [0-39] 34 (12/15 1900) BP: (87-139)/(51-103) 87/66 (12/15 1900) SpO2:  [87 %-100 %] 98 % (12/15 1956) Arterial Line BP: (74-160)/(35-74) 109/49 (12/15 1900) FiO2 (%):  [40 %-50 %] 40 % (12/15 1825) Weight:  [158 lb 1.1 oz (71.7 kg)] 158 lb 1.1 oz (71.7 kg) (12/15 0439)  Filed Weights   12/06/17 0500 12/07/17 0500 12/08/17 0439  Weight: 154 lb 15.7 oz (70.3 kg) 147 lb 11.3 oz (67 kg) 158 lb 1.1 oz (71.7 kg)    Weight change: 10 lb 5.8 oz (4.7 kg)   Hemodynamic parameters for last 24 hours: PAP: (20-44)/(6-30) 24/12 CVP:  [6 mmHg-14 mmHg] 7 mmHg CO:  [3.9 L/min-4.2 L/min] 4 L/min CI:  [1.9 L/min/m2-2.1 L/min/m2] 2 L/min/m2  Intake/Output from previous day: 12/14 0701 - 12/15 0700 In: 3823.6 [I.V.:2743.6; Blood:300; NG/GT:30; IV Piggyback:750] Out: 7673 [Urine:3520; Blood:600; Chest Tube:430]  Intake/Output this shift: Total I/O In: 100 [IV Piggyback:100] Out: -   Current Meds: Scheduled Meds: . acetaminophen  1,000 mg Oral Q6H   Or  . acetaminophen (TYLENOL) oral liquid 160 mg/5 mL  1,000 mg Per Tube Q6H  . aspirin EC  325 mg Oral Daily   Or  . aspirin  324 mg Per Tube Daily  . atorvastatin  80 mg Oral q1800  . bisacodyl  10 mg Oral Daily   Or  . bisacodyl  10 mg Rectal Daily  . chlorhexidine  gluconate (MEDLINE KIT)  15 mL Mouth Rinse BID  . Chlorhexidine Gluconate Cloth  6 each Topical Daily  . digoxin  0.125 mg Oral Daily  . docusate sodium  200 mg Oral Daily  . feeding supplement (ENSURE ENLIVE)  237 mL Oral TID BM  . feeding supplement (PRO-STAT SUGAR FREE 64)  30 mL Oral BID WC  . folic acid  1 mg Oral Daily  . insulin regular  0-10 Units Intravenous TID WC  . levalbuterol  0.63 mg Nebulization TID  . mouth rinse  15 mL Mouth Rinse 10 times per day  . metoCLOPramide (REGLAN) injection  10 mg Intravenous Q6H  . mupirocin ointment   Nasal BID  . [START ON 12/09/2017] pantoprazole  40 mg Oral Daily  . sodium chloride flush  3 mL Intravenous Q12H  . thiamine  100 mg Oral Daily   Continuous Infusions: . sodium chloride    . sodium chloride    . sodium chloride    . amiodarone 30 mg/hr (12/08/17 1645)  . cefUROXime (ZINACEF)  IV Stopped (12/08/17 1645)  . dexmedetomidine (PRECEDEX) IV infusion 0.2 mcg/kg/hr (12/08/17 1845)  . EPINEPHrine 4 mg in dextrose 5% 250 mL infusion (16 mcg/mL) 2 mcg/min (12/08/17 1600)  .  insulin (NOVOLIN-R) infusion 3.1 Units/hr (12/08/17 1939)  . lactated ringers 10 mL/hr at 12/08/17 1600  . lactated ringers 10 mL/hr at 12/08/17 1600  . milrinone 0.25 mcg/kg/min (12/08/17 1600)  . nitroGLYCERIN Stopped (12/07/17 1900)  . norepinephrine (LEVOPHED) Adult infusion 2 mcg/min (12/08/17 1600)  . phenylephrine (NEO-SYNEPHRINE) Adult infusion Stopped (12/07/17 1520)  . potassium chloride 10 mEq (12/08/17 1905)  . vancomycin Stopped (12/08/17 0900)   PRN Meds:.sodium chloride, metoprolol tartrate, midazolam, morphine injection, ondansetron (ZOFRAN) IV, oxyCODONE, sodium chloride flush, traMADol  General appearance: alert, cooperative and no distress Neurologic: intact Heart: RRR Lungs: Diminshed at bases Abdomen: Soft, non tender, bowel sounds present Extremities: Mild LE edema Wounds: Right LE dressing is clean and dry.  Lab  Results: CBC: Recent Labs    12/08/17 0411 12/08/17 1554 12/08/17 1607  WBC 8.8 10.3  --   HGB 10.4* 10.3* 9.9*  HCT 29.7* 30.1* 29.0*  PLT 162 162  --    BMET:  Recent Labs    12/07/17 0400  12/08/17 0411 12/08/17 1554 12/08/17 1607  NA 140   < > 138  --  141  K 3.5   < > 4.1  --  3.6  CL 108   < > 109  --  103  CO2 26  --  21*  --   --   GLUCOSE 130*   < > 130*  --  120*  BUN 10   < > 8  --  7  CREATININE 1.01   < > 0.92 0.87 0.80  CALCIUM 8.8*  --  8.0*  --   --    < > = values in this interval not displayed.    CMET: Lab Results  Component Value Date   WBC 10.3 12/08/2017   HGB 9.9 (L) 12/08/2017   HCT 29.0 (L) 12/08/2017   PLT 162 12/08/2017   GLUCOSE 120 (H) 12/08/2017   TRIG 101 11/29/2017   ALT 29 12/07/2017   AST 23 12/07/2017   NA 141 12/08/2017   K 3.6 12/08/2017   CL 103 12/08/2017   CREATININE 0.80 12/08/2017   BUN 7 12/08/2017   CO2 21 (L) 12/08/2017   TSH 1.844 12/04/2017   INR 1.50 12/08/2017   HGBA1C 5.0 12/06/2017      PT/INR:  Recent Labs    12/08/17 0411  LABPROT 18.0*  INR 1.50   Radiology: Dg Chest Port 1 View  Result Date: 12/08/2017 CLINICAL DATA:  Status post coronary bypass graft EXAM: PORTABLE CHEST 1 VIEW COMPARISON:  12/07/2017 FINDINGS: Cardiac shadow is enlarged but stable. Postoperative changes are again seen. Swan-Ganz catheter is noted in the pulmonary outflow tract. Endotracheal tube, nasogastric catheter, mediastinal drain and left thoracostomy catheter are again noted and stable. The lungs are well aerated bilaterally. No pneumothorax is seen. Left-sided PICC line is also noted in satisfactory position. Multiple rib fractures are noted particularly on the right. IMPRESSION: Tubes and lines as described above. No acute abnormality noted. Electronically Signed   By: Inez Catalina M.D.   On: 12/08/2017 07:28    Assessment/Plan: S/P Procedure(s) (LRB): CORONARY ARTERY BYPASS GRAFTING (CABG) times four using left  internal mammary artery and right saphenous vein using endoscope for harvest. (N/A) TRANSESOPHAGEAL ECHOCARDIOGRAM (TEE) (N/A)  1. CV-Cardiogenic shock. IABP removed earlier today. Had 30+ beats of NSVT 12/14 with brief loss of pulse-lidocaine drip was started. Since earlier this am, he has been off Lidocaine and Epinephrine drips. On Nor epinephrine, and Milrinone drips. Also, on  Digoxin 0.125 mg daily.Co ox earlier 66.7. CVP 6. CO/CI 4 and 2 respectively. 2. Pulmonary- On 4 liters via [place]. Will wean as able over next several days.Chest tubes with 330 last 12 hours. CXR no pneumothorax, right rib fractures. Encourage incentive spirometer. 3. ABL anemia-H and H increased to 10.4 and 29.7 4. ID-was on Zosyn but stopped earlier today. On Vancomycin. Fever to 101 the am of 12/14. WBC this am 10,300. UA negative. CXR showed no pneumonia. UC and BC results pending. Questionable etiology.  5. History or ETOH abuse-continue folic acid and Thiamine. 6. CBGs 130/161/137. Pre op HGA1C 5. Will stop Insulin drip, continue accu checks and SS PRN.  Donielle Liston Alba PA-C  patient examined and medical record reviewed,agree with above note. Tharon Aquas Trigt III 12/09/2017   12/08/2017 7:58 PM

## 2017-12-08 NOTE — Progress Notes (Signed)
Sheath and balloon pump removed from Lt Femoral Artery. Manual Pressure held for 30 minutes. Hemostasis achieved. VSS. Rt groin site site with no active bleeding or hematoma noted. 4x4 and tegaderm applied to Lt groin site. Hannah,RN in the room during sheath pull. Hannah,RN assessed groin site. Will continue to monitor site for any bleeding or hematoma. Patient resting at this time.

## 2017-12-09 ENCOUNTER — Inpatient Hospital Stay (HOSPITAL_COMMUNITY): Payer: Medicaid Other

## 2017-12-09 DIAGNOSIS — Z951 Presence of aortocoronary bypass graft: Secondary | ICD-10-CM

## 2017-12-09 LAB — EXPECTORATED SPUTUM ASSESSMENT W GRAM STAIN, RFLX TO RESP C

## 2017-12-09 LAB — BASIC METABOLIC PANEL
Anion gap: 8 (ref 5–15)
BUN: 8 mg/dL (ref 6–20)
CHLORIDE: 106 mmol/L (ref 101–111)
CO2: 22 mmol/L (ref 22–32)
CREATININE: 0.83 mg/dL (ref 0.61–1.24)
Calcium: 8.1 mg/dL — ABNORMAL LOW (ref 8.9–10.3)
GFR calc non Af Amer: >60 mL/min (ref >60)
Glucose, Bld: 123 mg/dL — ABNORMAL HIGH (ref 65–99)
POTASSIUM: 4.1 mmol/L (ref 3.5–5.1)
Sodium: 136 mmol/L (ref 135–145)

## 2017-12-09 LAB — CBC WITH DIFFERENTIAL/PLATELET
Basophils Absolute: 0 10*3/uL (ref 0.0–0.1)
Basophils Relative: 0 %
EOS ABS: 0.1 10*3/uL (ref 0.0–0.7)
Eosinophils Relative: 1 %
HCT: 31.9 % — ABNORMAL LOW (ref 39.0–52.0)
HEMOGLOBIN: 10.6 g/dL — AB (ref 13.0–17.0)
LYMPHS ABS: 0.5 10*3/uL — AB (ref 0.7–4.0)
Lymphocytes Relative: 4 %
MCH: 30.9 pg (ref 26.0–34.0)
MCHC: 33.2 g/dL (ref 30.0–36.0)
MCV: 93 fL (ref 78.0–100.0)
Monocytes Absolute: 1.1 10*3/uL — ABNORMAL HIGH (ref 0.1–1.0)
Monocytes Relative: 9 %
NEUTROS PCT: 86 %
Neutro Abs: 11.6 10*3/uL — ABNORMAL HIGH (ref 1.7–7.7)
Platelets: 159 10*3/uL (ref 150–400)
RBC: 3.43 MIL/uL — AB (ref 4.22–5.81)
RDW: 13.1 % (ref 11.5–15.5)
WBC: 13.3 10*3/uL — ABNORMAL HIGH (ref 4.0–10.5)

## 2017-12-09 LAB — POCT I-STAT 3, ART BLOOD GAS (G3+)
Acid-base deficit: 3 mmol/L — ABNORMAL HIGH (ref 0.0–2.0)
BICARBONATE: 20.9 mmol/L (ref 20.0–28.0)
O2 Saturation: 99 %
PCO2 ART: 35.5 mmHg (ref 32.0–48.0)
TCO2: 22 mmol/L (ref 22–32)
pH, Arterial: 7.382 (ref 7.350–7.450)
pO2, Arterial: 153 mmHg — ABNORMAL HIGH (ref 83.0–108.0)

## 2017-12-09 LAB — COOXEMETRY PANEL
Carboxyhemoglobin: 1.3 % (ref 0.5–1.5)
Methemoglobin: 1 % (ref 0.0–1.5)
O2 SAT: 67.2 %
TOTAL HEMOGLOBIN: 12.5 g/dL (ref 12.0–16.0)

## 2017-12-09 LAB — URINE CULTURE: CULTURE: NO GROWTH

## 2017-12-09 LAB — GLUCOSE, CAPILLARY
GLUCOSE-CAPILLARY: 138 mg/dL — AB (ref 65–99)
Glucose-Capillary: 129 mg/dL — ABNORMAL HIGH (ref 65–99)
Glucose-Capillary: 112 mg/dL — ABNORMAL HIGH (ref 65–99)
Glucose-Capillary: 144 mg/dL — ABNORMAL HIGH (ref 65–99)
Glucose-Capillary: 157 mg/dL — ABNORMAL HIGH (ref 65–99)
Glucose-Capillary: 105 mg/dL — ABNORMAL HIGH (ref 65–99)

## 2017-12-09 LAB — EXPECTORATED SPUTUM ASSESSMENT W REFEX TO RESP CULTURE

## 2017-12-09 MED ORDER — AMIODARONE HCL 200 MG PO TABS
200.0000 mg | ORAL_TABLET | Freq: Two times a day (BID) | ORAL | Status: DC
  Administered 2017-12-09 – 2017-12-13 (×9): 200 mg via ORAL
  Filled 2017-12-09 (×9): qty 1

## 2017-12-09 MED ORDER — FUROSEMIDE 10 MG/ML IJ SOLN
20.0000 mg | Freq: Every day | INTRAMUSCULAR | Status: DC
  Administered 2017-12-09: 20 mg via INTRAVENOUS
  Filled 2017-12-09: qty 2

## 2017-12-09 MED ORDER — PIPERACILLIN-TAZOBACTAM 3.375 G IVPB
3.3750 g | Freq: Three times a day (TID) | INTRAVENOUS | Status: DC
  Administered 2017-12-09: 3.375 g via INTRAVENOUS
  Filled 2017-12-09 (×3): qty 50

## 2017-12-09 MED ORDER — ENOXAPARIN SODIUM 40 MG/0.4ML ~~LOC~~ SOLN
40.0000 mg | SUBCUTANEOUS | Status: DC
  Administered 2017-12-09 – 2017-12-12 (×4): 40 mg via SUBCUTANEOUS
  Filled 2017-12-09 (×4): qty 0.4

## 2017-12-09 MED ORDER — LOSARTAN POTASSIUM 25 MG PO TABS
12.5000 mg | ORAL_TABLET | Freq: Two times a day (BID) | ORAL | Status: DC
  Administered 2017-12-09 – 2017-12-10 (×3): 12.5 mg via ORAL
  Filled 2017-12-09 (×3): qty 1

## 2017-12-09 MED ORDER — VANCOMYCIN HCL IN DEXTROSE 1-5 GM/200ML-% IV SOLN
1000.0000 mg | Freq: Two times a day (BID) | INTRAVENOUS | Status: DC
  Administered 2017-12-09 – 2017-12-11 (×6): 1000 mg via INTRAVENOUS
  Filled 2017-12-09 (×8): qty 200

## 2017-12-09 MED ORDER — PIPERACILLIN-TAZOBACTAM 3.375 G IVPB
3.3750 g | Freq: Three times a day (TID) | INTRAVENOUS | Status: DC
  Administered 2017-12-09 – 2017-12-12 (×8): 3.375 g via INTRAVENOUS
  Filled 2017-12-09 (×12): qty 50

## 2017-12-09 NOTE — Progress Notes (Signed)
2 Days Post-Op Procedure(s) (LRB): CORONARY ARTERY BYPASS GRAFTING (CABG) times four using left internal mammary artery and right saphenous vein using endoscope for harvest. (N/A) TRANSESOPHAGEAL ECHOCARDIOGRAM (TEE) (N/A) Subjective: Pre and postop fevers Will get OOB today nsr  Objective: Vital signs in last 24 hours: Temp:  [99.1 F (37.3 C)-100.9 F (38.3 C)] 100 F (37.8 C) (12/16 1000) Pulse Rate:  [35-96] 91 (12/16 1000) Cardiac Rhythm: Normal sinus rhythm (12/16 0819) Resp:  [5-38] 33 (12/16 1000) BP: (87-152)/(51-91) 152/91 (12/16 1000) SpO2:  [87 %-100 %] 98 % (12/16 1000) Arterial Line BP: (101-185)/(47-72) 127/72 (12/16 0900) FiO2 (%):  [40 %-50 %] 40 % (12/15 1825) Weight:  [159 lb 6.3 oz (72.3 kg)] 159 lb 6.3 oz (72.3 kg) (12/16 0430)  Hemodynamic parameters for last 24 hours: PAP: (20-34)/(6-15) 32/12 CVP:  [4 mmHg-9 mmHg] 5 mmHg CO:  [4 L/min-6.5 L/min] 6.5 L/min CI:  [2 L/min/m2-3.2 L/min/m2] 3.2 L/min/m2  Intake/Output from previous day: 12/15 0701 - 12/16 0700 In: 2817.8 [I.V.:1772.8; NG/GT:195; IV Piggyback:850] Out: 2082 [Urine:1292; Emesis/NG output:200; Chest Tube:590] Intake/Output this shift: Total I/O In: 112.6 [I.V.:112.6] Out: 125 [Urine:125]       Exam    General- alert and comfortable   Lungs- clear without rales, wheezes   Cor- regular rate and rhythm, no murmur , gallop   Abdomen- soft, non-tender   Extremities - warm, non-tender, minimal edema   Neuro- oriented, appropriate, no focal weakness   Lab Results: Recent Labs    12/08/17 1554 12/08/17 1607 12/09/17 0428  WBC 10.3  --  13.3*  HGB 10.3* 9.9* 10.6*  HCT 30.1* 29.0* 31.9*  PLT 162  --  159   BMET:  Recent Labs    12/08/17 0411  12/08/17 1607 12/09/17 0428  NA 138  --  141 136  K 4.1  --  3.6 4.1  CL 109  --  103 106  CO2 21*  --   --  22  GLUCOSE 130*  --  120* 123*  BUN 8  --  7 8  CREATININE 0.92   < > 0.80 0.83  CALCIUM 8.0*  --   --  8.1*   < > =  values in this interval not displayed.    PT/INR:  Recent Labs    12/08/17 0411  LABPROT 18.0*  INR 1.50   ABG    Component Value Date/Time   PHART 7.382 12/09/2017 0450   HCO3 20.9 12/09/2017 0450   TCO2 22 12/09/2017 0450   ACIDBASEDEF 3.0 (H) 12/09/2017 0450   O2SAT 67.2 12/09/2017 0508   CBG (last 3)  Recent Labs    12/09/17 0452 12/09/17 0716 12/09/17 1112  GLUCAP 129* 112* 144*    Assessment/Plan: S/P Procedure(s) (LRB): CORONARY ARTERY BYPASS GRAFTING (CABG) times four using left internal mammary artery and right saphenous vein using endoscope for harvest. (N/A) TRANSESOPHAGEAL ECHOCARDIOGRAM (TEE) (N/A) Mobilize Diuresis slow milrinone wean   LOS: 10 days    Kathlee Nationseter Van Trigt III 12/09/2017

## 2017-12-09 NOTE — Progress Notes (Signed)
RN received call from micro lab. Sputum culture sent down has to much saliva to use for testing. This is most of what the patient is spitting out so they will be unable to complete order request.

## 2017-12-09 NOTE — Progress Notes (Signed)
Patient ID: [name], male   DOB: 1961/01/31, 56 y.o.   MRN: [id number]    Advanced Heart Failure Progress   Primary Cardiologist:  New to [facility] (Dr. Saunders Revel) Primary HF: New (Dr. Haroldine Laws)   HPI:    IABP removed 12/10 and he had recurrent VF arrest. IABP replaced.  CABG 12/14.    No further VT/NSVT. IABP removed 12/15.  Off norepinephrine and epinephrine, remains on milrinone 0.25 and amiodarone 30 mg/hr.  SBP 120s.  Extubated 12/15.   Fever to 101 overnight again. CXR with L basilar infiltrate versus atelectasis (personally reviewed).    Swan numbers CVP 4-5 PA 26/8 CI 3.2 Co-ox 67%  Echo: EF ~30% RV normal.   LHC 11/29/17 Left Main  Dist LM lesion 40% stenosed  Left Anterior Descending  Prox LAD-1 lesion is 60% stenosed. The lesion is calcified.  Prox LAD-2 lesion 80% stenosed  First Diagonal Branch  Vessel is small in size.  Ost 1st Diag to 1st Diag lesion 70% stenosed  Second Diagonal Branch  Vessel is small in size. There is mild disease in the vessel.  Left Circumflex  Ost Cx lesion 30% stenosed  Prox Cx lesion is 80% stenosed. The lesion is calcified.  Prox Cx to Mid Cx lesion 80% stenosed  First Obtuse Marginal Branch  Vessel is small in size.  Ost 1st Mrg lesion 90% stenosed  Second Obtuse Marginal Branch  Ost 2nd Mrg to 2nd Mrg lesion 60% stenosed  Right Coronary Artery  Collaterals  Mid RCA filled by collaterals from Prox RCA.    Prox RCA to Mid RCA lesion is 100% stenosed. The lesion is chronically occluded with bridging and left-to-right collateral flow.  Right Posterior Descending Artery  Collaterals  RPDA filled by collaterals from 2nd Sept.     Objective:    Vital Signs:   Temp:  [99.1 F (37.3 C)-100.9 F (38.3 C)] 100.2 F (37.9 C) (12/16 0900) Pulse Rate:  [35-96] 88 (12/16 0900) Resp:  [5-38] 26 (12/16 0900) BP: (87-138)/(51-89) 133/87 (12/16 0900) SpO2:  [87 %-100 %] 100 % (12/16 0900) Arterial Line BP: (101-185)/(47-72)  127/72 (12/16 0900) FiO2 (%):  [40 %-50 %] 40 % (12/15 1825) Weight:  [159 lb 6.3 oz (72.3 kg)] 159 lb 6.3 oz (72.3 kg) (12/16 0430) Last BM Date: 12/06/17  Weight change: Filed Weights   12/07/17 0500 12/08/17 0439 12/09/17 0430  Weight: 147 lb 11.3 oz (67 kg) 158 lb 1.1 oz (71.7 kg) 159 lb 6.3 oz (72.3 kg)    Intake/Output:   Intake/Output Summary (Last 24 hours) at 12/09/2017 0913 Last data filed at 12/09/2017 0800 Gross per 24 hour  Intake 1799.61 ml  Output 1952 ml  Net -152.39 ml      Physical Exam   CVP 4-5 General: NAD Neck: No JVD, no thyromegaly or thyroid nodule.  Lungs: Clear to auscultation bilaterally with normal respiratory effort. CV: Nondisplaced PMI.  Heart regular S1/S2, no S3/S4, no murmur.  No peripheral edema.   Abdomen: Soft, nontender, no hepatosplenomegaly, no distention.  Skin: Intact without lesions or rashes.  Neurologic: Alert and oriented x 3.  Psych: Normal affect. Extremities: No clubbing or cyanosis.  HEENT: Normal.    Telemetry   NSR 90s (personally reviewed)   Labs   Basic Metabolic Panel: Recent Labs  Lab 12/03/17 0354 12/04/17 0331 12/05/17 0451 12/06/17 0503 12/07/17 0400  12/07/17 1335 12/07/17 1455 12/07/17 2033 12/07/17 2045 12/08/17 0411 12/08/17 1554 12/08/17 1607 12/09/17 0428  NA 137  138 139 137 140   < > 143 145  --  143 138  --  141 136  K 4.2 4.1 3.6 3.6 3.5   < > 3.7 3.6  --  4.5 4.1  --  3.6 4.1  CL 106 106 107 105 108   < > 104  --   --  108 109  --  103 106  CO2 '23 25 26 25 26  ' --   --   --   --   --  21*  --   --  22  GLUCOSE 105* 108* 110* 124* 130*   < > 167* 136*  --  115* 130*  --  120* 123*  BUN 5* '8 11 13 10   ' < > 7  --   --  8 8  --  7 8  CREATININE 0.86 0.94 1.00 1.04 1.01   < > 0.60*  --  0.85 0.70 0.92 0.87 0.80 0.83  CALCIUM 8.5* 8.5* 8.4* 8.6* 8.8*  --   --   --   --   --  8.0*  --   --  8.1*  MG 1.7 2.1  --   --   --   --   --   --  2.8*  --  2.2 2.1  --   --   PHOS 3.7 3.0  --   --    --   --   --   --   --   --   --   --   --   --    < > = values in this interval not displayed.    Liver Function Tests: Recent Labs  Lab 12/05/17 0451 12/06/17 0503 12/07/17 0400  AST '18 22 23  ' ALT '17 22 29  ' ALKPHOS 30* 32* 40  BILITOT 0.8 0.6 0.7  PROT 5.2* 5.6* 5.6*  ALBUMIN 2.7* 2.8* 3.0*   No results for input(s): LIPASE, AMYLASE in the last 168 hours. No results for input(s): AMMONIA in the last 168 hours.  CBC: Recent Labs  Lab 12/07/17 1502 12/07/17 2033 12/07/17 2045 12/08/17 0411 12/08/17 1554 12/08/17 1607 12/09/17 0428  WBC 15.7* 9.2  --  8.8 10.3  --  13.3*  NEUTROABS  --   --   --   --   --   --  11.6*  HGB 11.2* 10.4* 9.5* 10.4* 10.3* 9.9* 10.6*  HCT 32.5* 30.4* 28.0* 29.7* 30.1* 29.0* 31.9*  MCV 91.0 90.7  --  91.4 92.3  --  93.0  PLT 161 152  --  162 162  --  159    Cardiac Enzymes: No results for input(s): CKTOTAL, CKMB, CKMBINDEX, TROPONINI in the last 168 hours.  BNP: BNP (last 3 results) No results for input(s): BNP in the last 8760 hours.  ProBNP (last 3 results) No results for input(s): PROBNP in the last 8760 hours.   CBG: Recent Labs  Lab 12/08/17 1816 12/08/17 1844 12/08/17 2001 12/09/17 0452 12/09/17 0716  GLUCAP 161* 137* 99 129* 112*    Coagulation Studies: Recent Labs    12/07/17 1502 12/08/17 0411  LABPROT 17.8* 18.0*  INR 1.48 1.50    Imaging   Dg Chest Port 1 View  Result Date: 12/09/2017 CLINICAL DATA:  Pleural effusion EXAM: PORTABLE CHEST 1 VIEW COMPARISON:  12/08/2017 FINDINGS: Cardiac shadow is enlarged. Postsurgical changes are again seen and stable. Mediastinal drain and left thoracostomy catheter are again noted and stable. Swan-Ganz catheter from a  left subclavian approach is seen. The lungs are well-aerated. Very minimal left basilar atelectasis is noted. No pneumothorax is seen. Old rib fractures are noted. IMPRESSION: Minimal left basilar atelectasis. Electronically Signed   By: Inez Catalina M.D.    On: 12/09/2017 07:53     Medications:     Current Medications: . acetaminophen  1,000 mg Oral Q6H   Or  . acetaminophen (TYLENOL) oral liquid 160 mg/5 mL  1,000 mg Per Tube Q6H  . aspirin EC  325 mg Oral Daily   Or  . aspirin  324 mg Per Tube Daily  . atorvastatin  80 mg Oral q1800  . bisacodyl  10 mg Oral Daily   Or  . bisacodyl  10 mg Rectal Daily  . chlorhexidine  15 mL Mouth Rinse BID  . chlorhexidine gluconate (MEDLINE KIT)  15 mL Mouth Rinse BID  . Chlorhexidine Gluconate Cloth  6 each Topical Daily  . digoxin  0.125 mg Oral Daily  . docusate sodium  200 mg Oral Daily  . feeding supplement (ENSURE ENLIVE)  237 mL Oral TID BM  . feeding supplement (PRO-STAT SUGAR FREE 64)  30 mL Oral BID WC  . folic acid  1 mg Oral Daily  . insulin aspart  0-24 Units Subcutaneous Q4H  . levalbuterol  0.63 mg Nebulization TID  . losartan  12.5 mg Oral BID  . mouth rinse  15 mL Mouth Rinse q12n4p  . metoCLOPramide (REGLAN) injection  10 mg Intravenous Q6H  . mupirocin ointment   Nasal BID  . pantoprazole  40 mg Oral Daily  . sodium chloride flush  3 mL Intravenous Q12H  . thiamine  100 mg Oral Daily    Infusions: . sodium chloride    . sodium chloride    . sodium chloride    . amiodarone 30 mg/hr (12/09/17 0800)  . dexmedetomidine (PRECEDEX) IV infusion Stopped (12/08/17 2100)  . lactated ringers 10 mL/hr at 12/09/17 0800  . lactated ringers 10 mL/hr at 12/09/17 0800  . milrinone 0.25 mcg/kg/min (12/09/17 0800)  . nitroGLYCERIN Stopped (12/07/17 1900)  . norepinephrine (LEVOPHED) Adult infusion Stopped (12/09/17 0300)  . phenylephrine (NEO-SYNEPHRINE) Adult infusion Stopped (12/07/17 1520)    Patient Profile   Madison Albea is a 56 y.o. male with unknown medical history who presented to [facility] 11/29/17 after VT/VF arrest. Intubated emergently in ER. Chest CT with RUL collapse. Pt underwent bronch and taken emergently to cath lab.  Cath showed Severe 3v CAD and elevated  LVEDP. IABP placed and started on milrinone with low output CHF.   On 12/10 IABP removed and had recurrent VF. IABP replaced.  Assessment/Plan   1. Cardiogenic shock: EF 25-30% by echo due to iCM.  S/p CABG 12/14.  This morning, good co-ox and CI by Luiz Blare.  IABP removed 12/15 and norepinephrine/epinephrine stopped.  He remains on milrinone 0.25.  CVP 4-5.   - Remove Swan today.  - Decrease milrinone to 0.125 - Start losartan 12.5 mg bid.    - CVP 4-5, will not diurese.  - Continue digoxin 0.125.  2. CAD: Severe 3vD as above.  S/p CABG on 12/14.  - Continue ASA/statin.  3. ID: ?left basilar infiltrate.  Tmax 101 again last night, blood/urine cultures sent.  He has been off vancomyin since 12/7 and Zosyn since 12/13.   - Would restart empiric vancomycin/Zosyn for now, culture sputum as well.   4. Severe ETOH abuse 5. VT: No further VT/NSVT.   - Continue  amiodarone gtt while on milrinone.    Mobilize.   CRITICAL CARE Performed by: Loralie Champagne  Total critical care time: 35  minutes  Critical care time was exclusive of separately billable procedures and treating other patients.  Critical care was necessary to treat or prevent imminent or life-threatening deterioration.  Critical care was time spent personally by me on the following activities: development of treatment plan with patient and/or surrogate as well as nursing, discussions with consultants, evaluation of patient's response to treatment, examination of patient, obtaining history from patient or surrogate, ordering and performing treatments and interventions, ordering and review of laboratory studies, ordering and review of radiographic studies, pulse oximetry and re-evaluation of patient's condition. Length of Stay: [place], [place]  12/09/2017, 9:13 AM  Advanced Heart Failure Team Pager [phone] (M-F; 7a - 4p)  Please contact [facility] for night-coverage after hours (4p -7a ) and weekends on [email or web address]

## 2017-12-09 NOTE — Progress Notes (Signed)
Pharmacy Antibiotic Note  [name] is a 56 y.o. male admitted on 11/29/2017. Started on vancomycin and zosyn for fever and possible RUL pna after cardiac arrest. S/p IABP, s/p CABG now with elevated wbc up 13 (wnl last several days), spike Temp 102 last pm BCX and UCx sent Last dose zosyn 12/13 Last dose vacn 12/7 (except for surgical px doses 12/14&15)  Plan: Restart vancomcyin 1gm IV q12h Restart zosyn 3.375gm IV q8h EI  Height: 5\' 10"  (177.8 cm) Weight: 159 lb 6.3 oz (72.3 kg) IBW/kg (Calculated) : 73  Temp (24hrs), Avg:100.4 F (38 C), Min:99.1 F (37.3 C), Max:100.9 F (38.3 C)  Recent Labs  Lab 12/07/17 1502 12/07/17 2033 12/07/17 2045 12/08/17 0411 12/08/17 1554 12/08/17 1607 12/09/17 0428  WBC 15.7* 9.2  --  8.8 10.3  --  13.3*  CREATININE  --  0.85 0.70 0.92 0.87 0.80 0.83    Estimated Creatinine Clearance: 104 mL/min (by C-G formula based on SCr of 0.83 mg/dL).    No Known Allergies  Jose SauersLisa Daiton [name] Pharm.D. CPP, BCPS Clinical Pharmacist [phone] 12/09/2017 9:22 AM

## 2017-12-10 ENCOUNTER — Inpatient Hospital Stay (HOSPITAL_COMMUNITY): Payer: Medicaid Other

## 2017-12-10 ENCOUNTER — Encounter (HOSPITAL_COMMUNITY): Payer: Self-pay | Admitting: Cardiothoracic Surgery

## 2017-12-10 LAB — TYPE AND SCREEN
ABO/RH(D): A POS
Antibody Screen: NEGATIVE
Unit division: 0
Unit division: 0
Unit division: 0
Unit division: 0

## 2017-12-10 LAB — GLUCOSE, CAPILLARY
GLUCOSE-CAPILLARY: 104 mg/dL — AB (ref 65–99)
GLUCOSE-CAPILLARY: 98 mg/dL (ref 65–99)
GLUCOSE-CAPILLARY: 124 mg/dL — AB (ref 65–99)
GLUCOSE-CAPILLARY: 96 mg/dL (ref 65–99)
Glucose-Capillary: 97 mg/dL (ref 65–99)
Glucose-Capillary: 105 mg/dL — ABNORMAL HIGH (ref 65–99)

## 2017-12-10 LAB — BPAM RBC
Blood Product Expiration Date: 201901012359
Blood Product Expiration Date: 201901012359
Blood Product Expiration Date: 201901012359
Blood Product Expiration Date: 201901012359
ISSUE DATE / TIME: 201812140838
ISSUE DATE / TIME: 201812140838
ISSUE DATE / TIME: 201812161019
ISSUE DATE / TIME: 201812161019
Unit Type and Rh: 6200
Unit Type and Rh: 6200
Unit Type and Rh: 6200
Unit Type and Rh: 6200

## 2017-12-10 LAB — BASIC METABOLIC PANEL
ANION GAP: 7 (ref 5–15)
BUN: 7 mg/dL (ref 6–20)
CALCIUM: 8.2 mg/dL — AB (ref 8.9–10.3)
CO2: 27 mmol/L (ref 22–32)
Chloride: 105 mmol/L (ref 101–111)
Creatinine, Ser: 0.81 mg/dL (ref 0.61–1.24)
Glucose, Bld: 120 mg/dL — ABNORMAL HIGH (ref 65–99)
Potassium: 3.7 mmol/L (ref 3.5–5.1)
Sodium: 139 mmol/L (ref 135–145)

## 2017-12-10 LAB — CBC WITH DIFFERENTIAL/PLATELET
BASOS ABS: 0 10*3/uL (ref 0.0–0.1)
BASOS PCT: 0 %
Eosinophils Absolute: 0.2 10*3/uL (ref 0.0–0.7)
Eosinophils Relative: 1 %
HEMATOCRIT: 31.6 % — AB (ref 39.0–52.0)
HEMOGLOBIN: 10.7 g/dL — AB (ref 13.0–17.0)
Lymphocytes Relative: 5 %
Lymphs Abs: 0.7 10*3/uL (ref 0.7–4.0)
MCH: 31.4 pg (ref 26.0–34.0)
MCHC: 33.9 g/dL (ref 30.0–36.0)
MCV: 92.7 fL (ref 78.0–100.0)
Monocytes Absolute: 1.3 10*3/uL — ABNORMAL HIGH (ref 0.1–1.0)
Monocytes Relative: 9 %
NEUTROS ABS: 11.2 10*3/uL — AB (ref 1.7–7.7)
NEUTROS PCT: 85 %
Platelets: 163 10*3/uL (ref 150–400)
RBC: 3.41 MIL/uL — ABNORMAL LOW (ref 4.22–5.81)
RDW: 12.9 % (ref 11.5–15.5)
WBC: 13.4 10*3/uL — AB (ref 4.0–10.5)

## 2017-12-10 LAB — COOXEMETRY PANEL
CARBOXYHEMOGLOBIN: 1.2 % (ref 0.5–1.5)
METHEMOGLOBIN: 1.1 % (ref 0.0–1.5)
O2 Saturation: 57.2 %
Total hemoglobin: 16.8 g/dL — ABNORMAL HIGH (ref 12.0–16.0)

## 2017-12-10 MED ORDER — LOSARTAN POTASSIUM 25 MG PO TABS
25.0000 mg | ORAL_TABLET | Freq: Two times a day (BID) | ORAL | Status: DC
  Administered 2017-12-10 – 2017-12-13 (×6): 25 mg via ORAL
  Filled 2017-12-10 (×6): qty 1

## 2017-12-10 MED ORDER — SPIRONOLACTONE 12.5 MG HALF TABLET
12.5000 mg | ORAL_TABLET | Freq: Every day | ORAL | Status: DC
  Administered 2017-12-10: 12.5 mg via ORAL
  Filled 2017-12-10 (×2): qty 1

## 2017-12-10 MED ORDER — MORPHINE SULFATE (PF) 4 MG/ML IV SOLN: 2.0000 mg | INTRAVENOUS | Status: DC | PRN

## 2017-12-10 MED ORDER — FUROSEMIDE 10 MG/ML IJ SOLN
20.0000 mg | Freq: Once | INTRAMUSCULAR | Status: AC
  Administered 2017-12-10: 20 mg via INTRAVENOUS
  Filled 2017-12-10: qty 2

## 2017-12-10 MED ORDER — POTASSIUM CHLORIDE 10 MEQ/50ML IV SOLN
10.0000 meq | INTRAVENOUS | Status: AC
  Administered 2017-12-10 (×3): 10 meq via INTRAVENOUS
  Filled 2017-12-10 (×3): qty 50

## 2017-12-10 MED FILL — Potassium Chloride Inj 2 mEq/ML: INTRAVENOUS | Qty: 10 | Status: AC

## 2017-12-10 MED FILL — Magnesium Sulfate Inj 50%: INTRAMUSCULAR | Qty: 10 | Status: AC

## 2017-12-10 MED FILL — Heparin Sodium (Porcine) Inj 1000 Unit/ML: INTRAMUSCULAR | Qty: 30 | Status: AC

## 2017-12-10 NOTE — Progress Notes (Signed)
Chest tube, foley, and introducer have all been removed per order. No noted complications. Patient resting quietly in bed. Would like to go for a walk before lunch.

## 2017-12-10 NOTE — Progress Notes (Signed)
PT Cancellation Note  Patient Details Name: [name] MRN: [id number] DOB: 02/07/1961   Cancelled Treatment:    Reason Eval/Treat Not Completed: Other (comment). Pt just back to bed for tubes to be removed. Will see later today.   Angelina OkCary W Maycok 12/10/2017, 9:25 AM Skip Mayerary Daryll Spisak PT [phone]

## 2017-12-10 NOTE — Progress Notes (Signed)
Patient ID: [name], male   DOB: November 10, 1961, 56 y.o.   MRN: [id number]    Advanced Heart Failure Progress   Primary Cardiologist:  New to [facility] (Dr. Saunders Revel) Primary HF: New (Dr. Haroldine Laws)   HPI:    IABP removed 12/10 and he had recurrent VF arrest. IABP replaced.  CABG 12/14.   IABP removed 12/15.  Extubated 12/15.  Swan pulled 12/09/17.  No further VT/NSVT. Off norepinephrine and epinephrine.  Coox 57.2% on milrinone 0.125 mcg/kg/min. Now on po amiodarone. SBP 100-110s.    Tmax 100.9. CXR this am pending.  WBC 13.3 -> 13.4. Weight down 3 lbs.   Feeling better. Denies CP, SOB, lightheadedness, or dizziness.   TTE 11/30/17 25-30% RV normal.  TTE 12/04/2017 LVEF 40-45% (On IABP) TEE 12/07/17 LVEF 45-50% (On IABP)  LHC 11/29/17 Left Main  Dist LM lesion 40% stenosed  Left Anterior Descending  Prox LAD-1 lesion is 60% stenosed. The lesion is calcified.  Prox LAD-2 lesion 80% stenosed  First Diagonal Branch  Vessel is small in size.  Ost 1st Diag to 1st Diag lesion 70% stenosed  Second Diagonal Branch  Vessel is small in size. There is mild disease in the vessel.  Left Circumflex  Ost Cx lesion 30% stenosed  Prox Cx lesion is 80% stenosed. The lesion is calcified.  Prox Cx to Mid Cx lesion 80% stenosed  First Obtuse Marginal Branch  Vessel is small in size.  Ost 1st Mrg lesion 90% stenosed  Second Obtuse Marginal Branch  Ost 2nd Mrg to 2nd Mrg lesion 60% stenosed  Right Coronary Artery  Collaterals  Mid RCA filled by collaterals from Prox RCA.    Prox RCA to Mid RCA lesion is 100% stenosed. The lesion is chronically occluded with bridging and left-to-right collateral flow.  Right Posterior Descending Artery  Collaterals  RPDA filled by collaterals from 2nd Sept.   Objective:    Vital Signs:   Temp:  [98.6 F (37 C)-100.9 F (38.3 C)] 98.7 F (37.1 C) (12/17 0400) Pulse Rate:  [88-101] 93 (12/17 0700) Resp:  [22-45] 26 (12/17 0700) BP:  (107-152)/(65-92) 137/89 (12/17 0557) SpO2:  [92 %-100 %] 92 % (12/17 0700) Arterial Line BP: (115-158)/(70-72) 115/71 (12/16 1100) Weight:  [156 lb 4.9 oz (70.9 kg)] 156 lb 4.9 oz (70.9 kg) (12/17 0500) Last BM Date: 12/09/17  Weight change: Filed Weights   12/08/17 0439 12/09/17 0430 12/10/17 0500  Weight: 158 lb 1.1 oz (71.7 kg) 159 lb 6.3 oz (72.3 kg) 156 lb 4.9 oz (70.9 kg)    Intake/Output:   Intake/Output Summary (Last 24 hours) at 12/10/2017 0752 Last data filed at 12/10/2017 0700 Gross per 24 hour  Intake 217.6 ml  Output 3197 ml  Net -2979.4 ml    Physical Exam    CVP 3-4 cm  General: Fatigued appearing.  HEENT: Normal Neck: Supple. JVP not elevated. Carotids 2+ bilat; no bruits. No thyromegaly or nodule noted. Cor: PMI nondisplaced. RRR, No M/G/R noted Lungs: CTAB, normal effort. Abdomen: Soft, non-tender, non-distended, no HSM. No bruits or masses. +BS  Extremities: No cyanosis, clubbing, or rash. Trace to 1+ edema. Cool to the touch.  Neuro: Alert & orientedx3, cranial nerves grossly intact. moves all 4 extremities w/o difficulty. Affect pleasant   Telemetry   NSR 90s, personally reviewed.   Labs   Basic Metabolic Panel: Recent Labs  Lab 12/04/17 0331  12/06/17 0503 12/07/17 0400  12/07/17 2033 12/07/17 2045 12/08/17 0411 12/08/17 1554 12/08/17 1607 12/09/17 8850  12/10/17 0425  NA 138   < > 137 140   < >  --  143 138  --  141 136 139  K 4.1   < > 3.6 3.5   < >  --  4.5 4.1  --  3.6 4.1 3.7  CL 106   < > 105 108   < >  --  108 109  --  103 106 105  CO2 25   < > 25 26  --   --   --  21*  --   --  22 27  GLUCOSE 108*   < > 124* 130*   < >  --  115* 130*  --  120* 123* 120*  BUN 8   < > 13 10   < >  --  8 8  --  _0 CREATININE 0.94   < > 1.04 1.01   < > 0.85 0.70 0.92 0.87 0.80 0.83 0.81  CALCIUM 8.5*   < > 8.6* 8.8*  --   --   --  8.0*  --   --  8.1* 8.2*  MG 2.1  --   --   --   --  2.8*  --  2.2 2.1  --   --   --   PHOS 3.0  --   --   --    --   --   --   --   --   --   --   --    < > = values in this interval not displayed.    Liver Function Tests: Recent Labs  Lab 12/05/17 0451 12/06/17 0503 12/07/17 0400  AST _1 ALT _2 ALKPHOS 30* 32* 40  BILITOT 0.8 0.6 0.7  PROT 5.2* 5.6* 5.6*  ALBUMIN 2.7* 2.8* 3.0*   No results for input(s): LIPASE, AMYLASE in the last 168 hours. No results for input(s): AMMONIA in the last 168 hours.  CBC: Recent Labs  Lab 12/07/17 2033  12/08/17 0411 12/08/17 1554 12/08/17 1607 12/09/17 0428 12/10/17 0425  WBC 9.2  --  8.8 10.3  --  13.3* 13.4*  NEUTROABS  --   --   --   --   --  11.6* 11.2*  HGB 10.4*   < > 10.4* 10.3* 9.9* 10.6* 10.7*  HCT 30.4*   < > 29.7* 30.1* 29.0* 31.9* 31.6*  MCV 90.7  --  91.4 92.3  --  93.0 92.7  PLT 152  --  162 162  --  159 163   < > = values in this interval not displayed.   Cardiac Enzymes: No results for input(s): CKTOTAL, CKMB, CKMBINDEX, TROPONINI in the last 168 hours.  BNP: BNP (last 3 results) No results for input(s): BNP in the last 8760 hours.  ProBNP (last 3 results) No results for input(s): PROBNP in the last 8760 hours.  CBG: Recent Labs  Lab 12/09/17 1112 12/09/17 1516 12/09/17 1917 12/09/17 2308 12/10/17 0349  GLUCAP 144* 138* 157* 105* 98   Coagulation Studies: Recent Labs    12/07/17 1502 12/08/17 0411  LABPROT 17.8* 18.0*  INR 1.48 1.50    Imaging   No results found.  Medications:    Current Medications: . acetaminophen  1,000 mg Oral Q6H   Or  . acetaminophen (TYLENOL) oral liquid 160 mg/5 mL  1,000 mg Per Tube Q6H  . amiodarone  200 mg Oral BID  . aspirin EC  325 mg Oral Daily   Or  . aspirin  324 mg Per Tube Daily  . atorvastatin  80 mg Oral q1800  . bisacodyl  10 mg Oral Daily   Or  . bisacodyl  10 mg Rectal Daily  . chlorhexidine  15 mL Mouth Rinse BID  . chlorhexidine gluconate (MEDLINE KIT)  15 mL Mouth Rinse BID  . Chlorhexidine Gluconate Cloth  6 each Topical Daily  .  digoxin  0.125 mg Oral Daily  . docusate sodium  200 mg Oral Daily  . enoxaparin (LOVENOX) injection  40 mg Subcutaneous Q24H  . feeding supplement (ENSURE ENLIVE)  237 mL Oral TID BM  . feeding supplement (PRO-STAT SUGAR FREE 64)  30 mL Oral BID WC  . folic acid  1 mg Oral Daily  . furosemide  20 mg Intravenous Daily  . insulin aspart  0-24 Units Subcutaneous Q4H  . levalbuterol  0.63 mg Nebulization TID  . losartan  12.5 mg Oral BID  . mouth rinse  15 mL Mouth Rinse q12n4p  . metoCLOPramide (REGLAN) injection  10 mg Intravenous Q6H  . mupirocin ointment   Nasal BID  . pantoprazole  40 mg Oral Daily  . sodium chloride flush  3 mL Intravenous Q12H  . thiamine  100 mg Oral Daily    Infusions: . sodium chloride    . sodium chloride    . sodium chloride    . milrinone 0.125 mcg/kg/min (12/10/17 0700)  . piperacillin-tazobactam (ZOSYN)  IV Stopped (12/10/17 0526)  . potassium chloride 10 mEq (12/10/17 0643)  . vancomycin Stopped (12/09/17 2229)    Patient Profile   [name] is a 56 y.o. male with unknown medical history who presented to [facility] 11/29/17 after VT/VF arrest. Intubated emergently in ER. Chest CT with RUL collapse. Pt underwent bronch and taken emergently to cath lab.  Cath showed Severe 3v CAD and elevated LVEDP. IABP placed and started on milrinone with low output CHF.   On 12/10 IABP removed and had recurrent VF. IABP replaced.  Assessment/Plan   1. Cardiogenic shock: EF 11/30/17 25-30% by echo due to iCM.  S/p CABG 12/14.  - IABP removed 12/15 and norepinephrine/epinephrine stopped.   - Coox 57.2% on milrinone 0.125 mcg/kg/min. Will stop and follow coox.  - CVP 3-4 cm, but LEs with continued 1+ edema. Lasix 20 mg x 1 today.  - Increase losartan to 25 mg BID.  - Add spiro 12.5 mg daily. - Continue digoxin 0.125 mg daily.  2. CAD: Severe 3vD as above.  S/p CABG on 12/14.  - Continue ASA/statin. Denies CP.  3. ID: ?left basilar infiltrate.  Tmax 100.9.   Had been off vancomyin since 12/7 and Zosyn since 12/13, but Vancomycin/Zosyn resumed 12/09/17. Sputum culture pending.  - UA negative. BCx NGTD. UCx NGTD. 4. Severe ETOH abuse - CIWA protocol 5. VT: No further VT/NSVT.   - Continue amiodarone gtt while on milrinone.   Length of Stay: [place], [place]  12/10/2017, 7:52 AM  Advanced Heart Failure Team Pager [phone] (M-F; 7a - 4p)  Please contact [facility] for night-coverage after hours (4p -7a ) and weekends on [email or web address]  Patient seen and examined with the above-signed Advanced Practice Provider and/or Housestaff. I personally reviewed laboratory data, imaging studies and relevant notes. I independently examined the patient and formulated the important aspects of the plan. I have edited the note to reflect any of my changes or salient points. I have personally  discussed the plan with the patient and/or family.  Remains mildly volume overloaded. Give lasix 20 IV again today. Add spiro. Co-ox marginal on low-dose milrinone. Will stop milrinone and follow. No b-blocker yet. Still with low-grade temps. Continue abx for now. Continue aggressive pulmonary toilet. Mobilize. Rhythm stable. Hopefully can stop amio tomorrow.   Glori Bickers, MD  10:04 AM

## 2017-12-10 NOTE — Op Note (Signed)
NAME:  [name], Jose                   ACCOUNT NO.:  MEDICAL RECORD NO.:  [id number]  LOCATION:                                 FACILITY:  PHYSICIAN:  Kerin PernaPeter Van Trigt, M.D.  DATE OF BIRTH:  [birth year]  DATE OF PROCEDURE:  12/07/2017 DATE OF DISCHARGE:                              OPERATIVE REPORT   OPERATION: 1. Coronary artery bypass grafting x4 (left internal mammary artery     LAD, saphenous vein graft to OM1, saphenous vein graft to OM2,     saphenous vein graft to distal RCA). 2. Endoscopic harvest of right leg greater saphenous vein.  SURGEON:  Kerin PernaPeter Van Trigt, MD.  ASSISTANT:  Doree Fudgeonielle Zimmerman, PA-C.  PREOPERATIVE DIAGNOSES:  Severe three-vessel coronary artery disease, ejection fraction 25-30%, preoperative intra-aortic balloon pump, preoperative ventricular fibrillation arrest x2 because of critical coronary anatomy.  POSTOPERATIVE DIAGNOSES:  Severe three-vessel coronary artery disease, ejection fraction 25-30%, preoperative intra-aortic balloon pump, preoperative ventricular fibrillation arrest x2 because of critical coronary anatomy.  ANESTHESIA:  General by Dr. Autumn PattyEdmond Fitzgerald.  CLINICAL NOTE:  [name] is a 56 year old Caucasian male, nonsmoker, who had a witnessed VFib arrest at his home and was treated with CPR by a neighbor and EMS.  He was transported to Jose hospital, cardioverted back to sinus rhythm from VFib arrest and supported on Jose ventilator for several days.  Echo showed poor LV function.  Catheterization showed severe 3-vessel coronary artery disease.  He gradually improved and had no neurologic deficit with a clear head CT scan.  He initially had probable aspiration pneumonia, which cleared.  A balloon pump was placed at Jose time of his admission and that was maintained through his hospitalization until surgery.  I saw [name] in consultation to evaluate for multivessel CABG after he was extubated and was able to start an oral  diet with adequate renal function and pulmonary function and no evidence of pneumonia.  I felt that [name]'s best long-term therapy was multivessel CABG and discussed Jose procedure in detail with [name].  He understood Jose location of Jose surgical incisions, Jose use of general anesthesia and cardiopulmonary bypass, and Jose expected postoperative hospital recovery.  He understood Jose risks of death, bleeding, blood transfusion, infection, postoperative pulmonary problems including pleural effusion, organ failure, and MI.  He agreed to proceed with surgery under what I felt was an informed consent.  OPERATIVE FINDINGS:  LV dilatation with anterolateral wall thinning, small but usable saphenous vein for conduit, small but good flow in Jose left IMA conduit and improvement in global LV function with separation from cardiopulmonary bypass performed without difficulty.  DESCRIPTION OF PROCEDURE:  [name] was brought to Jose operating room and placed supine on Jose operating table.  General anesthesia was induced under invasive hemodynamic monitoring.  A transesophageal echo probe was placed by Jose anesthesia team.  [name] was prepped and draped as a sterile field.  A proper time-out was performed.  A sternal incision was made as Jose saphenous vein was harvested endoscopically from Jose right leg.  Jose left internal mammary artery was harvested as a pedicle graft from its origin at Jose subclavian  vessels.  It was 1.5-mm vessel with excellent flow.  Jose sternal retractor was placed, and Jose pericardium was opened and suspended.  Pursestrings were placed in Jose ascending aorta and right atrium.  Heparin was administered and when ACT was documented as being therapeutic, [name] was cannulated and placed on cardiopulmonary bypass.  Jose coronaries were identified for grafting.  They were small and heavily diseased proximally.  Jose right coronary was a suboptimal, but graftable  target.  Jose LAD was small distally, but was grafted at Jose distal third of Jose vessel.  Jose circumflex vessels were small, but graftable.  Cardioplegic cannulas were placed both antegrade and retrograde cold blood cardioplegia.  [name] was cooled to 32 degrees.  Jose aortic crossclamp was applied and a liter of cold blood cardioplegia was delivered in split doses between Jose antegrade aortic and retrograde coronary sinus catheters.  There was good cardioplegic arrest and septal temperature dropped less than 12 degrees.  Cardioplegia was delivered every 20 minutes.  Jose distal coronary anastomoses were performed.  Jose first distal anastomosis was Jose distal RCA.  This was occluded proximally.  It was 1.5-mm vessel.  Reverse saphenous vein was sewn end-to-side with running 7-0 Prolene with good flow through Jose graft.  Cardioplegia was redosed.  Jose second distal anastomosis was Jose OM2 branch of Jose left circumflex. This was a 1.5-mm vessel with proximal 95% stenosis.  A reverse saphenous vein was sewn end-to-side with a running 7-0 Prolene with good flow through Jose graft.  Cardioplegia was redosed.  Jose third distal anastomosis was Jose OM1.  This was a smaller 1.2-mm vessel proximal 90% stenosis.  A smaller saphenous vein was sewn end-to- side with running 8-0 Prolene.  There was a good flow through Jose graft. Cardioplegia was redosed.  Jose fourth distal anastomosis was Jose distal through Jose LAD.  There was soft plaque in this area of Jose vessel, but a 1.5 mm probe passed easily proximally and distally.  Jose left IMA pedicle was brought through an opening in Jose left lateral pericardium, was brought down onto Jose LAD and sewn end-to-side with running 8-0 Prolene.  There was good flow through Jose anastomosis after briefly releasing Jose pedicle bulldog on Jose mammary artery.  Jose bulldog was reapplied, and Jose pedicle was secured to Jose epicardium with 6-0 Prolene sutures.   Cardioplegia was redosed.  While Jose crossclamp was still in place, 2 proximal vein anastomoses were performed in Jose ascending aorta using a 4.0 mm punch running 6-0 Prolene.  Jose third proximal anastomosis of Jose OM1 was sewn to Jose proximal segment of Jose OM2 vein graft close to where Jose OM2 graft was sewn to Jose aorta. Prior to removing Jose clamp, air was vented from Jose coronaries with a dose of retrograde warm blood cardioplegia.  Jose crossclamp was removed.  Jose heart was cardioverted back to a regular rhythm.  [name] was rewarmed and reperfused.  Jose grafts were opened and each had good flow and hemostasis was documented at Jose proximal and distal sites. Temporary pacing wires were applied.  [name] was rewarmed and reperfused.  Jose lungs were expanded.  [name] was then weaned from cardiopulmonary bypass on a low-dose of inotropes.  Jose balloon pump was re-initiated at our augmentation of 1:1.  [name]'s hemodynamics off bypass were stable.  Echo showed improved global LV function.  Protamine was administered without adverse reaction.  Jose cannulas were removed. Jose mediastinum was irrigated with warm saline.  Jose superior pericardial fat was closed it over Jose aorta.  Anterior mediastinal and left pleural chest tubes were placed and brought out through separate incisions.  Jose sternum was closed with a wire. [name] remained stable.  Jose pectoralis fascia was closed with a running #1 Vicryl.  Jose subcutaneous and skin layers were closed using running Vicryl and sterile dressings were applied.  Total cardiopulmonary bypass time was 148 minutes.     Kerin PernaPeter Van Trigt, M.D.     PV/MEDQ  D:  12/07/2017  T:  12/08/2017  Job:  [id number]  cc:   Bevelyn Bucklesaniel R. [name], MD

## 2017-12-10 NOTE — Progress Notes (Signed)
      [address]       [place] [id number]             [phone]      Resting comfortably  BP 125/76   Pulse 96   Temp (!) 97.5 F (36.4 C) (Oral)   Resp (!) 32   Ht 5\' 10"  (1.778 m)   Wt 156 lb 4.9 oz (70.9 kg)   SpO2 93%   BMI 22.43 kg/m    Intake/Output Summary (Last 24 hours) at 12/10/2017 1851 Last data filed at 12/10/2017 1800 Gross per 24 hour  Intake 1062.5 ml  Output 3267 ml  Net -2204.5 ml   Diuresing well  Viviann SpareSteven C. Dorris FetchHendrickson, MD [facility] [phone]

## 2017-12-10 NOTE — Care Management Note (Addendum)
Case Management Note  Patient Details  Name: [name] MRN: [id number] Date of Birth: 06/20/1961  Subjective/Objective:    From home alone,  acute v fib arrest, severe 3 vessel CAD with severe LV dysfunction and cardiogenic shock POD 1 left heart cath and coronary angiography, IABP insertion.  Possible candidate for later CABG after neuro and pulmonary status improves.   12/12 1118 Letha Capeeborah Javeria Briski RN, BSN- IABP removed 12/10, then recurrent VF arrest, IABP replaced, conts on neo, milrinone, tentatively plan for CABG 12/14, conts on zosyn for asp pna, possible TPN.     12/17 1122 Letha Capeeborah Hamad Whyte RN, BSN - POD 3 CABG, co - ox 57.2, mlrinone weaned to off, temp 100.9,cxr pending , wbc 13.4, sputum cx pending, mildly volume overloaded, conts on vanc/zosyn, ivk . 3, lasix iv x 1.  He has no insurance no PCP, [facility] at 10:30, he can utilized the pharmacy there for medication assistance.  He states he spoke with financial counselor and they are helping him to apply for Medicaid.  He states he would like to have a cane.  Order is in for cane , will need to be brought to his room at discharge.                               Action/Plan: Order in for cane, will need to be brought to his room at discharge.   Expected Discharge Date:                  Expected Discharge Plan:     In-House Referral:     Discharge planning Services  CM Consult  Post Acute Care Choice:    Choice offered to:     DME Arranged:    DME Agency:     HH Arranged:    HH Agency:     Status of Service:  In process, will continue to follow  If discussed at Long Length of Stay Meetings, dates discussed:    Additional Comments:  Leone Havenaylor, Torrez Renfroe Clinton, RN 12/10/2017, 2:34 PM

## 2017-12-10 NOTE — Progress Notes (Signed)
3 Days Post-Op Procedure(s) (LRB): CORONARY ARTERY BYPASS GRAFTING (CABG) times four using left internal mammary artery and right saphenous vein using endoscope for harvest. (N/A) TRANSESOPHAGEAL ECHOCARDIOGRAM (TEE) (N/A) Subjective: CABG 4 after V. fib arrest with ischemic cardiomyopathy Patient mobilize out of bed, chest tubes removed Maintaining sinus rhythm Diuresing well Slow wean of milrinone Chest x-ray clear  Objective: Vital signs in last 24 hours: Temp:  [97.5 F (36.4 C)-99.9 F (37.7 C)] 97.5 F (36.4 C) (12/17 1536) Pulse Rate:  [88-103] 95 (12/17 1600) Cardiac Rhythm: Normal sinus rhythm (12/17 1600) Resp:  [19-35] 33 (12/17 1600) BP: (103-145)/(65-99) 115/78 (12/17 1600) SpO2:  [92 %-100 %] 96 % (12/17 1600) Weight:  [156 lb 4.9 oz (70.9 kg)] 156 lb 4.9 oz (70.9 kg) (12/17 0500)  Hemodynamic parameters for last 24 hours: CVP:  [4 mmHg-11 mmHg] 4 mmHg  Intake/Output from previous day: 12/16 0701 - 12/17 0700 In: 217.6 [I.V.:217.6] Out: 3197 [Urine:2965; Stool:12; Chest Tube:220] Intake/Output this shift: Total I/O In: 730 [P.O.:480; IV Piggyback:250] Out: 1752 [Urine:1750; Stool:2]       Exam    General- alert and comfortable   Lungs- clear without rales, wheezes   Cor- regular rate and rhythm, no murmur , gallop   Abdomen- soft, non-tender   Extremities - warm, non-tender, minimal edema   Neuro- oriented, appropriate, no focal weakness   Lab Results: Recent Labs    12/09/17 0428 12/10/17 0425  WBC 13.3* 13.4*  HGB 10.6* 10.7*  HCT 31.9* 31.6*  PLT 159 163   BMET:  Recent Labs    12/09/17 0428 12/10/17 0425  NA 136 139  K 4.1 3.7  CL 106 105  CO2 22 27  GLUCOSE 123* 120*  BUN 8 7  CREATININE 0.83 0.81  CALCIUM 8.1* 8.2*    PT/INR:  Recent Labs    12/08/17 0411  LABPROT 18.0*  INR 1.50   ABG    Component Value Date/Time   PHART 7.382 12/09/2017 0450   HCO3 20.9 12/09/2017 0450   TCO2 22 12/09/2017 0450   ACIDBASEDEF  3.0 (H) 12/09/2017 0450   O2SAT 57.2 12/10/2017 0430   CBG (last 3)  Recent Labs    12/10/17 0746 12/10/17 1125 12/10/17 1546  GLUCAP 97 124* 96    Assessment/Plan: S/P Procedure(s) (LRB): CORONARY ARTERY BYPASS GRAFTING (CABG) times four using left internal mammary artery and right saphenous vein using endoscope for harvest. (N/A) TRANSESOPHAGEAL ECHOCARDIOGRAM (TEE) (N/A) Mobilize Diuresis d/c tubes/lines Continue low-dose milrinone   LOS: 11 days    Kathlee Nationseter Van Trigt III 12/10/2017

## 2017-12-10 NOTE — Evaluation (Signed)
Physical Therapy Evaluation Patient Details Name: [name] MRN: [id number] DOB: 07/07/1961 Today's Date: 12/10/2017   History of Present Illness  Pt adm 12/6 with vfib arrest and found to have severe 3V CAD with severe LV dysfunction and cardiogenic shock . Had IABP placed. IABP removed 12/10 and then recurrent vfib arrest with IABP replaced. Pt underwent CABG x 4 on 12/14. PMH - ETOH abuse  Clinical Impression  Pt doing very well with mobility. Will likely just see one more time for stairs and amb without an assistive device.    Follow Up Recommendations No PT follow up    Equipment Recommendations  None recommended by PT    Recommendations for Other Services       Precautions / Restrictions Precautions Precautions: Sternal      Mobility  Bed Mobility Overal bed mobility: Needs Assistance Bed Mobility: Supine to Sit     Supine to sit: Min assist     General bed mobility comments: Assist to elevate trunk while following sternal precautions  Transfers Overall transfer level: Needs assistance Equipment used: 4-wheeled walker Transfers: Sit to/from Stand Sit to Stand: Supervision         General transfer comment: supervision for lines. Verbal cues to follow sternal precautions  Ambulation/Gait Ambulation/Gait assistance: Supervision Ambulation Distance (Feet): 700 Feet Assistive device: 4-wheeled walker Gait Pattern/deviations: Step-through pattern   Gait velocity interpretation: at or above normal speed for age/gender General Gait Details: Steady gait with rollator. VSS  Stairs            Wheelchair Mobility    Modified Rankin (Stroke Patients Only)       Balance Overall balance assessment: No apparent balance deficits (not formally assessed)                                           Pertinent Vitals/Pain Pain Assessment: No/denies pain    Home Living Family/patient expects to be discharged to:: Private  residence Living Arrangements: Alone Available Help at Discharge: Friend(s);Available PRN/intermittently;Family Type of Home: Mobile home Home Access: Stairs to enter Entrance Stairs-Rails: Doctor, general practiceight;Left Entrance Stairs-Number of Steps: 5 Home Layout: One level Home Equipment: None      Prior Function Level of Independence: Independent               Hand Dominance        Extremity/Trunk Assessment   Upper Extremity Assessment Upper Extremity Assessment: Overall WFL for tasks assessed    Lower Extremity Assessment Lower Extremity Assessment: Generalized weakness       Communication   Communication: No difficulties  Cognition Arousal/Alertness: Awake/alert Behavior During Therapy: WFL for tasks assessed/performed Overall Cognitive Status: Within Functional Limits for tasks assessed                                        General Comments      Exercises     Assessment/Plan    PT Assessment Patient needs continued PT services  PT Problem List Decreased mobility;Decreased knowledge of precautions       PT Treatment Interventions Gait training;Stair training;Functional mobility training;Patient/family education    PT Goals (Current goals can be found in the Care Plan section)  Acute Rehab PT Goals Patient Stated Goal: go home PT Goal Formulation: With patient Time For  Goal Achievement: 12/14/17 Potential to Achieve Goals: Good    Frequency Min 3X/week   Barriers to discharge Inaccessible home environment stairs to enter    Co-evaluation               AM-PAC PT "6 Clicks" Daily Activity  Outcome Measure Difficulty turning over in bed (including adjusting bedclothes, sheets and blankets)?: A Little Difficulty moving from lying on back to sitting on the side of the bed? : Unable Difficulty sitting down on and standing up from a chair with arms (e.g., wheelchair, bedside commode, etc,.)?: A Little Help needed moving to and from a  bed to chair (including a wheelchair)?: A Little Help needed walking in hospital room?: A Little Help needed climbing 3-5 steps with a railing? : A Little 6 Click Score: 16    End of Session   Activity Tolerance: Patient tolerated treatment well Patient left: in chair;with call bell/phone within reach Nurse Communication: Mobility status PT Visit Diagnosis: Other abnormalities of gait and mobility (R26.89)    Time: [id number] PT Time Calculation (min) (ACUTE ONLY): 22 min   Charges:   PT Evaluation $PT Eval Moderate Complexity: 1 Mod     PT G CodesMarland Kitchen:        [facility] Ruth[facility] Occhipint[facility] PT [phone]   Angelina OkCary W [facility] 12/10/2017, 3:09 PM

## 2017-12-11 ENCOUNTER — Inpatient Hospital Stay (HOSPITAL_COMMUNITY): Payer: Medicaid Other

## 2017-12-11 DIAGNOSIS — I472 Ventricular tachycardia: Secondary | ICD-10-CM

## 2017-12-11 LAB — CBC WITH DIFFERENTIAL/PLATELET
BASOS ABS: 0 10*3/uL (ref 0.0–0.1)
Basophils Relative: 0 %
EOS ABS: 0.3 10*3/uL (ref 0.0–0.7)
EOS PCT: 3 %
HCT: 31 % — ABNORMAL LOW (ref 39.0–52.0)
Hemoglobin: 10.3 g/dL — ABNORMAL LOW (ref 13.0–17.0)
LYMPHS PCT: 9 %
Lymphs Abs: 1 10*3/uL (ref 0.7–4.0)
MCH: 31 pg (ref 26.0–34.0)
MCHC: 33.2 g/dL (ref 30.0–36.0)
MCV: 93.4 fL (ref 78.0–100.0)
MONO ABS: 1 10*3/uL (ref 0.1–1.0)
Monocytes Relative: 10 %
Neutro Abs: 8.4 10*3/uL — ABNORMAL HIGH (ref 1.7–7.7)
Neutrophils Relative %: 78 %
PLATELETS: 218 10*3/uL (ref 150–400)
RBC: 3.32 MIL/uL — ABNORMAL LOW (ref 4.22–5.81)
RDW: 12.8 % (ref 11.5–15.5)
WBC: 10.7 10*3/uL — AB (ref 4.0–10.5)

## 2017-12-11 LAB — BASIC METABOLIC PANEL
Anion gap: 9 (ref 5–15)
BUN: 7 mg/dL (ref 6–20)
CALCIUM: 7.9 mg/dL — AB (ref 8.9–10.3)
CO2: 26 mmol/L (ref 22–32)
CREATININE: 0.9 mg/dL (ref 0.61–1.24)
Chloride: 104 mmol/L (ref 101–111)
GLUCOSE: 118 mg/dL — AB (ref 65–99)
Potassium: 3.3 mmol/L — ABNORMAL LOW (ref 3.5–5.1)
Sodium: 139 mmol/L (ref 135–145)

## 2017-12-11 LAB — COOXEMETRY PANEL
Carboxyhemoglobin: 1.6 % — ABNORMAL HIGH (ref 0.5–1.5)
METHEMOGLOBIN: 1 % (ref 0.0–1.5)
O2 SAT: 52.3 %
TOTAL HEMOGLOBIN: 12.2 g/dL (ref 12.0–16.0)

## 2017-12-11 LAB — DIGOXIN LEVEL: Digoxin Level: <0.2 ng/mL — ABNORMAL LOW (ref 0.8–2.0)

## 2017-12-11 MED ORDER — POTASSIUM CHLORIDE CRYS ER 20 MEQ PO TBCR
20.0000 meq | EXTENDED_RELEASE_TABLET | ORAL | Status: DC | PRN
  Administered 2017-12-11: 20 meq via ORAL
  Filled 2017-12-11: qty 1

## 2017-12-11 MED ORDER — FUROSEMIDE 20 MG PO TABS
20.0000 mg | ORAL_TABLET | Freq: Every day | ORAL | Status: DC
  Administered 2017-12-11 – 2017-12-12 (×2): 20 mg via ORAL
  Filled 2017-12-11 (×2): qty 1

## 2017-12-11 MED ORDER — SPIRONOLACTONE 25 MG PO TABS
25.0000 mg | ORAL_TABLET | Freq: Every day | ORAL | Status: DC
  Administered 2017-12-11 – 2017-12-13 (×3): 25 mg via ORAL
  Filled 2017-12-11 (×3): qty 1

## 2017-12-11 NOTE — Progress Notes (Signed)
4 Days Post-Op Procedure(s) (LRB): CORONARY ARTERY BYPASS GRAFTING (CABG) times four using left internal mammary artery and right saphenous vein using endoscope for harvest. (N/A) TRANSESOPHAGEAL ECHOCARDIOGRAM (TEE) (N/A) Subjective  Urgent CABG status post V. fib arrest with ischemic cardiomyopathy Patient progressing, off drips maintaining sinus rhythm Chest x-ray clear Tolerating regular diet Ready to move to stepdown  Objective: Vital signs in last 24 hours: Temp:  [98 F (36.7 C)-99 F (37.2 C)] 98 F (36.7 C) (12/18 1144) Pulse Rate:  [88-99] 95 (12/18 1400) Cardiac Rhythm: Normal sinus rhythm (12/18 1143) Resp:  [21-33] 23 (12/18 1400) BP: (99-139)/(56-87) 129/72 (12/18 1200) SpO2:  [91 %-99 %] 95 % (12/18 1400) Weight:  [151 lb 7.3 oz (68.7 kg)] 151 lb 7.3 oz (68.7 kg) (12/18 0600)  Hemodynamic parameters for last 24 hours: CVP:  [4 mmHg-8 mmHg] 8 mmHg  Intake/Output from previous day: 12/17 0701 - 12/18 0700 In: 1270 [P.O.:720; IV Piggyback:550] Out: 3027 [Urine:3025; Stool:2] Intake/Output this shift: Total I/O In: 490 [P.O.:240; IV Piggyback:250] Out: 850 [Urine:850]       Exam    General- alert and comfortable   Lungs- clear without rales, wheezes   Cor- regular rate and rhythm, no murmur , gallop   Abdomen- soft, non-tender   Extremities - warm, non-tender, minimal edema   Neuro- oriented, appropriate, no focal weakness   Lab Results: Recent Labs    12/10/17 0425 12/11/17 0455  WBC 13.4* 10.7*  HGB 10.7* 10.3*  HCT 31.6* 31.0*  PLT 163 218   BMET:  Recent Labs    12/10/17 0425 12/11/17 0455  NA 139 139  K 3.7 3.3*  CL 105 104  CO2 27 26  GLUCOSE 120* 118*  BUN 7 7  CREATININE 0.81 0.90  CALCIUM 8.2* 7.9*    PT/INR: No results for input(s): LABPROT, INR in the last 72 hours. ABG    Component Value Date/Time   PHART 7.382 12/09/2017 0450   HCO3 20.9 12/09/2017 0450   TCO2 22 12/09/2017 0450   ACIDBASEDEF 3.0 (H) 12/09/2017 0450    O2SAT 52.3 12/11/2017 0500   CBG (last 3)  Recent Labs    12/10/17 0746 12/10/17 1125 12/10/17 1546  GLUCAP 97 124* 96    Assessment/Plan: S/P Procedure(s) (LRB): CORONARY ARTERY BYPASS GRAFTING (CABG) times four using left internal mammary artery and right saphenous vein using endoscope for harvest. (N/A) TRANSESOPHAGEAL ECHOCARDIOGRAM (TEE) (N/A) Mobilize Diuresis Plan for transfer to step-down: see transfer orders   LOS: 12 days    Kathlee Nationseter Van Trigt III 12/11/2017

## 2017-12-11 NOTE — Progress Notes (Signed)
1400 Chest PT held. Patient refused. Stated he just finished with walking and he's tired.

## 2017-12-11 NOTE — Progress Notes (Signed)
[facility] Patient going to room 01. All personal items accounted for.

## 2017-12-11 NOTE — Progress Notes (Signed)
Physical Therapy Treatment Patient Details Name: [name] MRN: [id number] DOB: 11/21/1961 Today's Date: 12/11/2017    History of Present Illness Pt adm 12/6 with vfib arrest and found to have severe 3V CAD with severe LV dysfunction and cardiogenic shock . Had IABP placed. IABP removed 12/10 and then recurrent vfib arrest with IABP replaced. Pt underwent CABG x 4 on 12/14. PMH - ETOH abuse    PT Comments    Pt remains impulsive during gait training and required education on pacing, endurance and performing too much activity.  Pt required re-education on sternal precautions as he was unable to verbalize.  Balance is not an issue but patient would benefit from 1 more visit to ensure he can recall and maintain sternal precautions during transfers and bed mobility.  Pt will also benefit from endurance training and education on pacing.   Follow Up Recommendations  No PT follow up     Equipment Recommendations  None recommended by PT    Recommendations for Other Services       Precautions / Restrictions Precautions Precautions: Sternal Restrictions Weight Bearing Restrictions: Yes(sternal precautions)    Mobility  Bed Mobility               General bed mobility comments: Pt standing on arrival and positioned in recliner post session.    Transfers Overall transfer level: Needs assistance Equipment used: None Transfers: Sit to/from Stand Sit to Stand: Supervision         General transfer comment: supervision for lines. Verbal cues to follow sternal precautions  Ambulation/Gait Ambulation/Gait assistance: Supervision Ambulation Distance (Feet): 800 Feet Assistive device: None Gait Pattern/deviations: Step-through pattern;Trunk flexed   Gait velocity interpretation: Below normal speed for age/gender General Gait Details: Cues for forward gaze and cervical extension.  Pt with increased respirations during gait training to 35rpm. HR 110bpm, and O2 sats varried  from 85%-100%, at one point O2 sats decreased to 64% but pleth wasnt accurate.  Informed nursing.     Stairs Stairs: Yes   Stair Management: One rail Right;Forwards;Alternating pattern Number of Stairs: 6 General stair comments: Pt performed stair training with R rail descending and L rail ascending.  Pt required cues for sequencing and safety.    Wheelchair Mobility    Modified Rankin (Stroke Patients Only)       Balance Overall balance assessment: No apparent balance deficits (not formally assessed)                                          Cognition Arousal/Alertness: Awake/alert Behavior During Therapy: WFL for tasks assessed/performed Overall Cognitive Status: Within Functional Limits for tasks assessed                                        Exercises      General Comments        Pertinent Vitals/Pain Pain Assessment: Faces Faces Pain Scale: Hurts a little bit Pain Descriptors / Indicators: Tightness Pain Intervention(s): Monitored during session;Repositioned    Home Living                      Prior Function            PT Goals (current goals can now be found in the care plan section)  Acute Rehab PT Goals Patient Stated Goal: go home Potential to Achieve Goals: Good Progress towards PT goals: Progressing toward goals    Frequency    Min 3X/week      PT Plan Current plan remains appropriate    Co-evaluation              AM-PAC PT "6 Clicks" Daily Activity  Outcome Measure  Difficulty turning over in bed (including adjusting bedclothes, sheets and blankets)?: A Little Difficulty moving from lying on back to sitting on the side of the bed? : A Little Difficulty sitting down on and standing up from a chair with arms (e.g., wheelchair, bedside commode, etc,.)?: None Help needed moving to and from a bed to chair (including a wheelchair)?: None Help needed walking in hospital room?: A Little Help  needed climbing 3-5 steps with a railing? : A Little 6 Click Score: 20    End of Session   Activity Tolerance: Patient tolerated treatment well Patient left: in chair;with call bell/phone within reach Nurse Communication: Mobility status PT Visit Diagnosis: Other abnormalities of gait and mobility (R26.89)     Time: [id number] PT Time Calculation (min) (ACUTE ONLY): 15 min  Charges:  $Gait Training: 8-22 mins                    G Codes:       [name], PTA pager [phone]    [name] 12/11/2017, 2:28 PM

## 2017-12-11 NOTE — Progress Notes (Signed)
4 Days Post-Op Procedure(s) (LRB): CORONARY ARTERY BYPASS GRAFTING (CABG) times four using left internal mammary artery and right saphenous vein using endoscope for harvest. (N/A) TRANSESOPHAGEAL ECHOCARDIOGRAM (TEE) (N/A) Subjective: waiking well Fever resolved    nsr  ready for step  down  Objective: Vital signs in last 24 hours: Temp:  [97.5 F (36.4 C)-99 F (37.2 C)] 98.5 F (36.9 C) (12/18 0805) Pulse Rate:  [89-103] 90 (12/18 0800) Cardiac Rhythm: Normal sinus rhythm (12/18 0801) Resp:  [19-33] 25 (12/18 0800) BP: (99-139)/(56-99) 135/85 (12/18 0600) SpO2:  [91 %-98 %] 94 % (12/18 0800) Weight:  [151 lb 7.3 oz (68.7 kg)] 151 lb 7.3 oz (68.7 kg) (12/18 0600)  Hemodynamic parameters for last 24 hours: CVP:  [4 mmHg-8 mmHg] 8 mmHg  Intake/Output from previous day: 12/17 0701 - 12/18 0700 In: 1270 [P.O.:720; IV Piggyback:550] Out: 3027 [Urine:3025; Stool:2] Intake/Output this shift: No intake/output data recorded.       Exam    General- alert and comfortable   Lungs- clear without rales, wheezes   Cor- regular rate and rhythm, no murmur , gallop   Abdomen- soft, non-tender   Extremities - warm, non-tender, minimal edema   Neuro- oriented, appropriate, no focal weakness   Lab Results: Recent Labs    12/10/17 0425 12/11/17 0455  WBC 13.4* 10.7*  HGB 10.7* 10.3*  HCT 31.6* 31.0*  PLT 163 218   BMET:  Recent Labs    12/10/17 0425 12/11/17 0455  NA 139 139  K 3.7 3.3*  CL 105 104  CO2 27 26  GLUCOSE 120* 118*  BUN 7 7  CREATININE 0.81 0.90  CALCIUM 8.2* 7.9*    PT/INR: No results for input(s): LABPROT, INR in the last 72 hours. ABG    Component Value Date/Time   PHART 7.382 12/09/2017 0450   HCO3 20.9 12/09/2017 0450   TCO2 22 12/09/2017 0450   ACIDBASEDEF 3.0 (H) 12/09/2017 0450   O2SAT 52.3 12/11/2017 0500   CBG (last 3)  Recent Labs    12/10/17 0746 12/10/17 1125 12/10/17 1546  GLUCAP 97 124* 96    Assessment/Plan: S/P  Procedure(s) (LRB): CORONARY ARTERY BYPASS GRAFTING (CABG) times four using left internal mammary artery and right saphenous vein using endoscope for harvest. (N/A) TRANSESOPHAGEAL ECHOCARDIOGRAM (TEE) (N/A) Mobilize d/c tubes/lines Plan for transfer to step-down: see transfer orders cont antibiotics   LOS: 12 days    [name] 12/11/2017

## 2017-12-11 NOTE — Progress Notes (Signed)
Patient ID: [name], male   DOB: Jan 24, 1961, 56 y.o.   MRN: [id number]    Advanced Heart Failure Progress   Primary Cardiologist:  New to [facility] (Dr. Okey Dupre) Primary HF: New (Dr. Gala Romney)   HPI:    IABP removed 12/10 and he had recurrent VF arrest. IABP replaced.  CABG 12/14.   IABP removed 12/15.  Extubated 12/15.  Swan pulled 12/09/17.  No further VT/NSVT. Off norepinephrine and epinephrine.  Coox 52.3% off milrinone. PICC line pulled.   Tmax 99. CXR this am pending. WBC 13.3 -> 13.4 -> 10.7.    Feeling great. Did 3 laps around the unit without difficult. He is mildly lightheaded with rapid standing, but it does not limit his activity. Denies CP or SOB.   TTE 11/30/17 25-30% RV normal.  TTE 12/04/2017 LVEF 40-45% (On IABP) TEE 12/07/17 LVEF 45-50% (On IABP)  LHC 11/29/17 Left Main  Dist LM lesion 40% stenosed  Left Anterior Descending  Prox LAD-1 lesion is 60% stenosed. The lesion is calcified.  Prox LAD-2 lesion 80% stenosed  First Diagonal Branch  Vessel is small in size.  Ost 1st Diag to 1st Diag lesion 70% stenosed  Second Diagonal Branch  Vessel is small in size. There is mild disease in the vessel.  Left Circumflex  Ost Cx lesion 30% stenosed  Prox Cx lesion is 80% stenosed. The lesion is calcified.  Prox Cx to Mid Cx lesion 80% stenosed  First Obtuse Marginal Branch  Vessel is small in size.  Ost 1st Mrg lesion 90% stenosed  Second Obtuse Marginal Branch  Ost 2nd Mrg to 2nd Mrg lesion 60% stenosed  Right Coronary Artery  Collaterals  Mid RCA filled by collaterals from Prox RCA.    Prox RCA to Mid RCA lesion is 100% stenosed. The lesion is chronically occluded with bridging and left-to-right collateral flow.  Right Posterior Descending Artery  Collaterals  RPDA filled by collaterals from 2nd Sept.   Objective:    Vital Signs:   Temp:  [97.5 F (36.4 C)-99 F (37.2 C)] 98.5 F (36.9 C) (12/18 0805) Pulse Rate:  [89-103] 90 (12/18  0800) Resp:  [19-33] 25 (12/18 0800) BP: (99-139)/(56-99) 135/85 (12/18 0600) SpO2:  [91 %-98 %] 94 % (12/18 0800) Weight:  [151 lb 7.3 oz (68.7 kg)] 151 lb 7.3 oz (68.7 kg) (12/18 0600) Last BM Date: 12/09/17  Weight change: Filed Weights   12/09/17 0430 12/10/17 0500 12/11/17 0600  Weight: 159 lb 6.3 oz (72.3 kg) 156 lb 4.9 oz (70.9 kg) 151 lb 7.3 oz (68.7 kg)    Intake/Output:   Intake/Output Summary (Last 24 hours) at 12/11/2017 0812 Last data filed at 12/11/2017 0130 Gross per 24 hour  Intake 1270 ml  Output 3027 ml  Net -1757 ml    Physical Exam   General: Well appearing. No resp difficulty. HEENT: Normal Neck: Supple. JVP 5-6. Carotids 2+ bilat; no bruits. No thyromegaly or nodule noted. Cor: PMI nondisplaced. RRR, No M/G/R noted Lungs: CTAB, normal effort. Abdomen: Soft, non-tender, non-distended, no HSM. No bruits or masses. +BS  Extremities: No cyanosis, clubbing, or rash. Trace to 1+ edema. Vein graft sites stable, C/D/I. Neuro: Alert & orientedx3, cranial nerves grossly intact. moves all 4 extremities w/o difficulty. Affect pleasant   Telemetry   NSR 90-100s, personally reviewed.   Labs   Basic Metabolic Panel: Recent Labs  Lab 12/07/17 0400  12/07/17 2033  12/08/17 0411 12/08/17 1554 12/08/17 1607 12/09/17 0428 12/10/17 0425 12/11/17 0455  NA 140   < >  --    < > 138  --  141 136 139 139  K 3.5   < >  --    < > 4.1  --  3.6 4.1 3.7 3.3*  CL 108   < >  --    < > 109  --  103 106 105 104  CO2 26  --   --   --  21*  --   --  22 27 26   GLUCOSE 130*   < >  --    < > 130*  --  120* 123* 120* 118*  BUN 10   < >  --    < > 8  --  7 8 7 7   CREATININE 1.01   < > 0.85   < > 0.92 0.87 0.80 0.83 0.81 0.90  CALCIUM 8.8*  --   --   --  8.0*  --   --  8.1* 8.2* 7.9*  MG  --   --  2.8*  --  2.2 2.1  --   --   --   --    < > = values in this interval not displayed.    Liver Function Tests: Recent Labs  Lab 12/05/17 0451 12/06/17 0503 12/07/17 0400  AST  18 22 23   ALT 17 22 29   ALKPHOS 30* 32* 40  BILITOT 0.8 0.6 0.7  PROT 5.2* 5.6* 5.6*  ALBUMIN 2.7* 2.8* 3.0*   No results for input(s): LIPASE, AMYLASE in the last 168 hours. No results for input(s): AMMONIA in the last 168 hours.  CBC: Recent Labs  Lab 12/08/17 0411 12/08/17 1554 12/08/17 1607 12/09/17 0428 12/10/17 0425 12/11/17 0455  WBC 8.8 10.3  --  13.3* 13.4* 10.7*  NEUTROABS  --   --   --  11.6* 11.2* 8.4*  HGB 10.4* 10.3* 9.9* 10.6* 10.7* 10.3*  HCT 29.7* 30.1* 29.0* 31.9* 31.6* 31.0*  MCV 91.4 92.3  --  93.0 92.7 93.4  PLT 162 162  --  159 163 218   Cardiac Enzymes: No results for input(s): CKTOTAL, CKMB, CKMBINDEX, TROPONINI in the last 168 hours.  BNP: BNP (last 3 results) No results for input(s): BNP in the last 8760 hours.  ProBNP (last 3 results) No results for input(s): PROBNP in the last 8760 hours.  CBG: Recent Labs  Lab 12/09/17 2308 12/10/17 0349 12/10/17 0746 12/10/17 1125 12/10/17 1546  GLUCAP 105* 98 97 124* 96   Coagulation Studies: No results for input(s): LABPROT, INR in the last 72 hours.  Imaging   No results found.  Medications:    Current Medications: . acetaminophen  1,000 mg Oral Q6H   Or  . acetaminophen (TYLENOL) oral liquid 160 mg/5 mL  1,000 mg Per Tube Q6H  . amiodarone  200 mg Oral BID  . aspirin EC  325 mg Oral Daily   Or  . aspirin  324 mg Per Tube Daily  . atorvastatin  80 mg Oral q1800  . bisacodyl  10 mg Oral Daily   Or  . bisacodyl  10 mg Rectal Daily  . chlorhexidine  15 mL Mouth Rinse BID  . Chlorhexidine Gluconate Cloth  6 each Topical Daily  . digoxin  0.125 mg Oral Daily  . docusate sodium  200 mg Oral Daily  . enoxaparin (LOVENOX) injection  40 mg Subcutaneous Q24H  . feeding supplement (ENSURE ENLIVE)  237 mL Oral TID BM  . feeding supplement (PRO-STAT  SUGAR FREE 64)  30 mL Oral BID WC  . folic acid  1 mg Oral Daily  . levalbuterol  0.63 mg Nebulization TID  . losartan  25 mg Oral BID  .  mouth rinse  15 mL Mouth Rinse q12n4p  . metoCLOPramide (REGLAN) injection  10 mg Intravenous Q6H  . mupirocin ointment   Nasal BID  . pantoprazole  40 mg Oral Daily  . sodium chloride flush  3 mL Intravenous Q12H  . spironolactone  12.5 mg Oral Daily  . thiamine  100 mg Oral Daily    Infusions: . sodium chloride    . sodium chloride    . sodium chloride    . piperacillin-tazobactam (ZOSYN)  IV Stopped (12/11/17 0530)  . vancomycin Stopped (12/11/17 0000)    Patient Profile   Briant CedarWesley Clay Stief is a 56 y.o. male with unknown medical history who [facility] after VT/VF arrest. Intubated emergently in ER. Chest CT with RUL collapse. Pt underwent bronch and taken emergently to cath lab.  Cath showed Severe 3v CAD and elevated LVEDP. IABP placed and started on milrinone with low output CHF.   On 12/10 IABP removed and had recurrent VF. IABP replaced.  Assessment/Plan   1. Cardiogenic shock: EF 11/30/17 25-30% by echo due to iCM.  S/p CABG 12/14.  - IABP removed 12/15 and norepinephrine/epinephrine stopped.   - Volume status stable to dry with lightheadedness. Weight down 5 lbs. Down to below pre-op weight. - Coox 52.3% off milrinone. PICC line removed.  - Continue losartan 25 mg BID. Can consider entresto as tolerated. Will not today with lightheadedness  - Increase spiro to 25 mg daily.  - Continue digoxin 0.125 mg daily.  -  2. CAD: Severe 3vD as above.  S/p CABG on 12/14.  - Continue ASA/statin. No s/s of ischemia.  3. ID: ?left basilar infiltrate.   - Tmax 99. Had been off vancomyin since 12/7 and Zosyn since 12/13, but Vancomycin/Zosyn resumed 12/09/17. Sputum culture pending.  - UA negative. UCx and BCx NGTD.  4. Severe ETOH abuse - CIWA protocol.  5. VT: No further VT/NSVT.   - Now on po amiodarone.   Doing well. To floor today. Will continue to adjust meds as tolerated.   Length of Stay: [address]12  Luane SchoolMichael Andrew Tillery, PA-C  12/11/2017, 8:12  AM  Advanced Heart Failure Team Pager [phone] (M-F; 7a - 4p)  Please contact [facility] for night-coverage after hours (4p -7a ) and weekends on [email or web address]  Patient seen and examined with the above-signed Advanced Practice Provider and/or Housestaff. I personally reviewed laboratory data, imaging studies and relevant notes. I independently examined the patient and formulated the important aspects of the plan. I have edited the note to reflect any of my changes or salient points. I have personally discussed the plan with the patient and/or family.  Making progress. Ambulating without difficulty. BP stable. No further VT on po amio. Can likely stop soon. Co-ox marginal but recent EF improved to 45% (on IABP).   Can go to SDU. Remains lightheaded. Agree with titrating spiro and losartan. Co-ox marginal so will hold b-blocker today. Will start tomorrow.   Arvilla Meresaniel [name], MD  9:21 AM

## 2017-12-11 NOTE — Progress Notes (Signed)
Patient arrived to unit. Patient is alert and oriented and VS 139/95 B/P, heart rate 96,Rep 21. Patient has all belongings. He is resting in bed comfortably. Called Central Telemetry and put him on the monitor.

## 2017-12-12 ENCOUNTER — Inpatient Hospital Stay: Payer: Self-pay | Admitting: Critical Care Medicine

## 2017-12-12 ENCOUNTER — Inpatient Hospital Stay (HOSPITAL_COMMUNITY): Payer: Medicaid Other

## 2017-12-12 DIAGNOSIS — Z951 Presence of aortocoronary bypass graft: Secondary | ICD-10-CM

## 2017-12-12 HISTORY — DX: Presence of aortocoronary bypass graft: Z95.1

## 2017-12-12 LAB — CBC WITH DIFFERENTIAL/PLATELET
Basophils Absolute: 0 10*3/uL (ref 0.0–0.1)
Basophils Relative: 0 %
EOS ABS: 0.3 10*3/uL (ref 0.0–0.7)
EOS PCT: 3 %
HCT: 33 % — ABNORMAL LOW (ref 39.0–52.0)
Hemoglobin: 10.9 g/dL — ABNORMAL LOW (ref 13.0–17.0)
LYMPHS ABS: 1.1 10*3/uL (ref 0.7–4.0)
Lymphocytes Relative: 13 %
MCH: 30.8 pg (ref 26.0–34.0)
MCHC: 33 g/dL (ref 30.0–36.0)
MCV: 93.2 fL (ref 78.0–100.0)
MONO ABS: 1 10*3/uL (ref 0.1–1.0)
MONOS PCT: 11 %
Neutro Abs: 6.3 10*3/uL (ref 1.7–7.7)
Neutrophils Relative %: 73 %
PLATELETS: 244 10*3/uL (ref 150–400)
RBC: 3.54 MIL/uL — AB (ref 4.22–5.81)
RDW: 12.7 % (ref 11.5–15.5)
WBC: 8.7 10*3/uL (ref 4.0–10.5)

## 2017-12-12 LAB — BASIC METABOLIC PANEL
Anion gap: 10 (ref 5–15)
BUN: 9 mg/dL (ref 6–20)
CALCIUM: 8.4 mg/dL — AB (ref 8.9–10.3)
CO2: 24 mmol/L (ref 22–32)
CREATININE: 0.97 mg/dL (ref 0.61–1.24)
Chloride: 105 mmol/L (ref 101–111)
GFR calc Af Amer: >60 mL/min (ref >60)
GLUCOSE: 102 mg/dL — AB (ref 65–99)
Potassium: 3.1 mmol/L — ABNORMAL LOW (ref 3.5–5.1)
SODIUM: 139 mmol/L (ref 135–145)

## 2017-12-12 MED ORDER — CARVEDILOL 3.125 MG PO TABS
3.1250 mg | ORAL_TABLET | Freq: Two times a day (BID) | ORAL | Status: DC
  Administered 2017-12-12 – 2017-12-13 (×4): 3.125 mg via ORAL
  Filled 2017-12-12 (×4): qty 1

## 2017-12-12 MED ORDER — LEVOFLOXACIN IN D5W 750 MG/150ML IV SOLN
750.0000 mg | INTRAVENOUS | Status: DC
  Administered 2017-12-12: 750 mg via INTRAVENOUS
  Filled 2017-12-12 (×2): qty 150

## 2017-12-12 MED ORDER — POTASSIUM CHLORIDE CRYS ER 20 MEQ PO TBCR
40.0000 meq | EXTENDED_RELEASE_TABLET | Freq: Two times a day (BID) | ORAL | Status: DC
  Administered 2017-12-12: 40 meq via ORAL
  Filled 2017-12-12: qty 2

## 2017-12-12 NOTE — Progress Notes (Addendum)
      [address]       [place] [id number]             [phone]      5 Days Post-Op Procedure(s) (LRB): CORONARY ARTERY BYPASS GRAFTING (CABG) times four using left internal mammary artery and right saphenous vein using endoscope for harvest. (N/A) TRANSESOPHAGEAL ECHOCARDIOGRAM (TEE) (N/A)   Subjective:  States he is doing awesome.  Has no complaints.  Hoping to get out of here soon.  He states he has stuff he needs to get taken care of.  + ambulation  + BM  Objective: Vital signs in last 24 hours: Temp:  [97.5 F (36.4 C)-99.4 F (37.4 C)] 97.5 F (36.4 C) (12/19 0807) Pulse Rate:  [81-106] 106 (12/19 0807) Cardiac Rhythm: Normal sinus rhythm (12/19 0700) Resp:  [22-30] 28 (12/19 0807) BP: (112-145)/(72-97) 123/97 (12/19 0807) SpO2:  [92 %-100 %] 100 % (12/19 0807) Weight:  [153 lb 8 oz (69.6 kg)] 153 lb 8 oz (69.6 kg) (12/19 0403)  Intake/Output from previous day: 12/18 0701 - 12/19 0700 In: 490 [P.O.:240; IV Piggyback:250] Out: 850 [Urine:850]  General appearance: alert, cooperative and no distress Heart: regular rate and rhythm Lungs: diminished breath sounds bibasilar Abdomen: soft, non-tender; bowel sounds normal; no masses,  no organomegaly Extremities: edema none present Wound: clean and dry  Lab Results: Recent Labs    12/11/17 0455 12/12/17 0538  WBC 10.7* 8.7  HGB 10.3* 10.9*  HCT 31.0* 33.0*  PLT 218 244   BMET:  Recent Labs    12/11/17 0455 12/12/17 0538  NA 139 139  K 3.3* 3.1*  CL 104 105  CO2 26 24  GLUCOSE 118* 102*  BUN 7 9  CREATININE 0.90 0.97  CALCIUM 7.9* 8.4*    PT/INR: No results for input(s): LABPROT, INR in the last 72 hours. ABG    Component Value Date/Time   PHART 7.382 12/09/2017 0450   HCO3 20.9 12/09/2017 0450   TCO2 22 12/09/2017 0450   ACIDBASEDEF 3.0 (H) 12/09/2017 0450   O2SAT 52.3 12/11/2017 0500   CBG (last 3)  Recent Labs    12/10/17 0746 12/10/17 1125 12/10/17 1546  GLUCAP 97  124* 96    Assessment/Plan: S/P Procedure(s) (LRB): CORONARY ARTERY BYPASS GRAFTING (CABG) times four using left internal mammary artery and right saphenous vein using endoscope for harvest. (N/A) TRANSESOPHAGEAL ECHOCARDIOGRAM (TEE) (N/A)  1. CV- NSR, mild tachycardia- continue Amiodarone, Cozaar, Digoxin 2. Pulm- bilateral pleural effusion L>R, patient is asymptomatic, good sats off oxygen, continue IS 3. Renal- creatinine has been WNL, weight is below baseline... Continue Lasix, Spironolactone 4. Hypokalemic- 3.1 after runs yesterday, will give 80 mg today... Must monitor closely with use of Spiro 5. D/C EPW 6. Dispo- patient stable, d/c EPW today, repeat BMET in AM, possibly ready for d/c in next 24-48 hours   LOS: 13 days    Lowella Dandyrin Barrett 12/12/2017 Patient getting stronger, incisions healing, chest x-ray with mild left hemidiaphragm elevation but no significant edema Patient will need his heart failure medicines titrated and optimized prior to discharge patient examined and medical record reviewed,agree with above note. Kathlee Nationseter Van Trigt III 12/12/2017

## 2017-12-12 NOTE — Discharge Summary (Addendum)
Physician Discharge Summary  Patient ID: [name] MRN: [id number] DOB/AGE: 54/04/1961 54 y.o.  Admit date: 11/29/2017 Discharge date: 12/14/2017  Admission Diagnoses: STEMI with V-Fib arrest Patient Active Problem List   Diagnosis Date Noted  . Coronary artery disease 12/07/2017  . Cardiac arrest (HCC) 11/29/2017  . Aspiration pneumonia [facility](HCC)    Discharge Diagnoses:   Patient Active Problem List   Diagnosis Date Noted  . 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 Coronary artery disease 12/07/2017  . Cardiac arrest (HCC) 11/29/2017  . Aspiration pneumonia (HCC)         STEMI with V-Fib arrest  Discharged Condition: good  History of Present Illness:  [name] is a 56 yo white male brought to the ED with a V. Fib arrest.  This occurred while the patient was chopping wood.  It was witnessed by his neighbor who started CPR.  EMS was contacted and brought patient to the ED.  He required intubation in the field.  EKG showed ST depression and Cardiology and Critical care consultation was requested.  CT scan of the chest showed collapse of the RUL.  CCM performed bronchoscopy.  Cardiology felt the patient would require emergent cardiac catheterization.  This was performed on 11/29/2017 and showed severe 3V CAD with placement of IACP.  He ultimately required AHF consultation for cardiogenic shock,  who placed patient on Milrinone and started patient on aggressive IV diuretics.  He was not felt to be a candidate for hypothermic protocol.  It was felt coronary bypass grafting would be indicated.  He was evaluated by Dr. [name] who was in agreement if the patient recovered neurologically and could get extubated prior to proceeding with surgery.  The patient was aggressively managed by heart failure and critical care.  He extubated himself on 12/9 which he tolerated without difficulty.  He was having episodes of severe agitation and family admitted to the patient being a heavy drinker.  He was treated  with broad spectrum antibiotics.  His balloon pump was removed on 12/10 without difficulty.  He again developed cardiac arrest on 12/10.  He was successfully resuscitated.  EKG obtained showed more evidence of ischemia.  Ultimately his balloon pump had to be replaced.  He denied chest pain post procedure.  He remained clinically stable.  He required TPN prior to surgery as his albumin level was low.  Echocardiogram was obtained and showed a low EF of 25-30 % but no MR was present.    Hospital Course:   He was medically stable and taken to the operating room on 12/07/2017.  He underwent CABG x 4 utilizing LIMA to LAD, SVG to OM1, SVG to OM2, and SVG to RCA.  He also underwent endoscopic harvest of greater saphenous vein from his right leg.  He tolerated the procedure and was taken to the SICU in stable condition.  He developed narrow complex VT post operatively with loss of pulse.  CPR was not initiated as patient converted to NSR without intervention.  He was treated with Lidocaine and Amiodarone.  He was weaned and extubated on POD #1.  During his stay in the SICU he was weaned off Epinephrine, Levophed and Milrinone drips as tolerated.  These were monitored by the AHF team and weaned as they felt appropriate.  He was aggressively diuresed for hypervolemia.  His chest tubes and arterial lines were removed without difficulty.  He developed post operative fever and was covered with broad spectrum antibiotics.  He was ambulating  independently and maintaining NSR.  He was transferred to the telemetry unit on 12/11/2017.  He continues to make progress.  He is maintaining NSR.  He continues to respond well to diuretic therapy.  He is hypokalemic and required additional supplementation.  His heart failure medications are being titrated by the AHF as his vitals allow.  His pacing wires were removed without difficulty.  He continues to ambulate independently.  He is medically stable for discharge home today.                    Consults: cardiology and pulmonary/intensive care  Significant Diagnostic Studies: angiography:   Conclusions: 1. Severe 3-vessel coronary artery disease, as detailed below. 2. Severely elevated left ventricular filling pressure. 3. Successful placement of intraaortic balloon pump via the right common femoral artery.  Recommendations: 1. Diuresis and hemodynamic support (IABP and inotropes) per Dr. [name] and the advanced heart failure team. 2. Obtain transthoracic echo to assess LVEF. 3. If patient has a reasonable neurologic recovery, cardiac surgery consultation is recommended for revascularization of severe three-vessel disease. 4. Start heparin infusion 2 hours after removal of right radial artery sheath.  Treatments: surgery:   1. Coronary artery bypass grafting x4 (left internal mammary artery     LAD, saphenous vein graft to OM1, saphenous vein graft to OM2,     saphenous vein graft to distal RCA). 2. Endoscopic harvest of right leg greater saphenous vein.  Disposition: SNF  Discharge Medications:  The patient has been discharged on:   1.Beta Blocker:  Yes [   ]                              No   [  x ]                              If No, reason: low EF, V. Fib arrest  2.Ace Inhibitor/ARB: Yes [ x  ]                                     No  [    ]                                     If No, reason:  3.Statin:   Yes [ x  ]                  No  [   ]                  If No, reason:  4.Ecasa:  Yes  [ x  ]                  No   [   ]                  If No, reason:      Allergies as of 12/13/2017   No Known Allergies     Medication List    TAKE these medications   amiodarone 200 MG tablet Commonly known as:  PACERONE Take 1 tablet (200 mg total) by mouth 2 (two) times daily. Decrease to 200 mg daily starting 12/17/2017   aspirin 325 MG  EC tablet Take 1 tablet (325 mg total) by mouth daily.   atorvastatin 80 MG tablet Commonly known as:   LIPITOR Take 1 tablet (80 mg total) by mouth daily at 6 PM.   carvedilol 3.125 MG tablet Commonly known as:  COREG Take 1 tablet (3.125 mg total) by mouth 2 (two) times daily with a meal.   digoxin 0.125 MG tablet Commonly known as:  LANOXIN Take 1 tablet (0.125 mg total) by mouth daily.   levofloxacin 750 MG tablet Commonly known as:  LEVAQUIN Take 1 tablet (750 mg total) by mouth daily.   oxyCODONE 5 MG immediate release tablet Commonly known as:  Oxy IR/ROXICODONE Take 1-2 tablets (5-10 mg total) by mouth every 3 (three) hours as needed for severe pain.   sacubitril-valsartan 24-26 MG Commonly known as:  ENTRESTO Take 1 tablet by mouth 2 (two) times daily.   spironolactone 25 MG tablet Commonly known as:  ALDACTONE Take 1 tablet (25 mg total) by mouth daily.      Follow-up Information    [facility] Follow up on 01/09/2018.   Why:  10:30 for hospital follow up  Contact information: [address] [place] [address] [phone]       [facility]. Go on 01/01/2018.   Specialty:  Cardiology Why:  10:30 AM, Advanced [facility] Failure [facility], parking code [id number] Contact information: [address] [id number] [place] [address] [phone]       [name], MD Follow up on 01/16/2018.   Specialty:  Cardiothoracic Surgery Why:  Appointment is at 12:45, please get CXR at 12:15 at [facility] located on first floor of our office building Contact information: [address] [place] [address] [phone]        [facility] - Dme Follow up.   Why:  They will do your home health care at your home Contact information: [address] [place] [address] [phone]           Signed: Lowella [name] 12/14/2017, 1:14 PM

## 2017-12-12 NOTE — Progress Notes (Signed)
CARDIAC REHAB PHASE I   PRE:  Rate/Rhythm: 90 SR  BP:  Supine:   Sitting: 140/95  Standing:    SaO2: 98%RA  MODE:  Ambulation: 800 ft   POST:  Rate/Rhythm: 101 ST  BP:  Supine:   Sitting: 133/77  Standing:    SaO2: 98%RA 1048-1115 Pt walked 800 ft with steady gait. Tolerated well. Reminded pt of sternal precautions. To sitting in recliner after walk. Encouraged IS and flutter valve.   Luetta Nuttingharlene Daiwik Buffalo, RN BSN  12/12/2017 11:10 AM

## 2017-12-12 NOTE — Progress Notes (Signed)
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 Had IABP placed. IABP removed 12/10 and then recurrent vfib arrest with IABP replaced. Pt underwent CABG x 4 on 12/14. PMH - ETOH abuse.   PT Comments    Pt has progressed well with mobility. Indep for bed mobility, transfers, and ambulation. Good ability to maintain sternal precautions throughout session. Will have assist from brother at d/c. Has met short-term acute PT goals. All education completed and questions answered. Pt has no further concerns. D/c acute PT.   Follow Up Recommendations  No PT follow up     Equipment Recommendations  None recommended by PT    Recommendations for Other Services       Precautions / Restrictions Precautions Precautions: Sternal Restrictions Other Position/Activity Restrictions: Sternal    Mobility  Bed Mobility Overal bed mobility: Independent Bed Mobility: Supine to Sit;Sit to Supine           General bed mobility comments: Able to maintain sternal precautions hugging heart pillow. Return to supine with no pillow necessary  Transfers Overall transfer level: Independent Equipment used: None                Ambulation/Gait Ambulation/Gait assistance: Independent Ambulation Distance (Feet): 700 Feet Assistive device: None Gait Pattern/deviations: Step-through pattern         Stairs            Wheelchair Mobility    Modified Rankin (Stroke Patients Only)       Balance Overall balance assessment: No apparent balance deficits (not formally assessed)                                          Cognition Arousal/Alertness: Awake/alert Behavior During Therapy: WFL for tasks assessed/performed Overall Cognitive Status: Within  Functional Limits for tasks assessed                                        Exercises      General Comments        Pertinent Vitals/Pain Pain Assessment: No/denies pain    Home Living                      Prior Function            PT Goals (current goals can now be found in the care plan section) Progress towards PT goals: Goals met/education completed, patient discharged from PT    Frequency    Min 3X/week      PT Plan Current plan remains appropriate    Co-evaluation              AM-PAC PT "6 Clicks" Daily Activity  Outcome Measure  Difficulty turning over in bed (including adjusting bedclothes, sheets and blankets)?: None Difficulty moving from lying on back to sitting on the side of the bed? : None Difficulty sitting down on and standing up from a chair with arms (e.g., wheelchair, bedside commode, etc,.)?: None Help needed moving to and from a bed to chair (including a wheelchair)?: None Help needed walking in hospital room?:  None Help needed climbing 3-5 steps with a railing? : A Little 6 Click Score: 23    End of Session Equipment Utilized During Treatment: Gait belt Activity Tolerance: Patient tolerated treatment well Patient left: in bed;with call bell/phone within reach Nurse Communication: Mobility status PT Visit Diagnosis: Other abnormalities of gait and mobility (R26.89)     Time: 5790-0920 PT Time Calculation (min) (ACUTE ONLY): 18 min  Charges:  $Gait Training: 8-22 mins                    G Codes:      Mabeline Caras, PT, DPT Acute Rehab Services  Pager: [place] 12/12/2017, 5:59 PM

## 2017-12-12 NOTE — Progress Notes (Signed)
Patient ID: [name], male   DOB: 1961/03/31, 56 y.o.   MRN: [id number]    Advanced Heart Failure Progress   Primary Cardiologist:  New to [facility] (Dr. Okey Dupre) Primary HF: New (Dr. Gala Romney)   HPI:    IABP removed 12/10 and he had recurrent VF arrest. IABP replaced.  CABG 12/14.   IABP removed 12/15.  Extubated 12/15.  Swan pulled 12/09/17.  No further VT/NSVT. Off norepinephrine and epinephrine.  Coox 52.3% off milrinone 12/11/17. PICC line pulled.   Tmax 99.4. CXR this am with small to moderate left and small right pleural effusion.  WBC 13.3 -> 13.4 -> 10.7 -> 8.7  Feeling great. Hasn't been up walking yet but walked the halls multiple times yesterday without difficulty. Specifically denies CP, lightheadedness, or dizziness. No SOB.   TTE 11/30/17 25-30% RV normal.  TTE 12/04/2017 LVEF 40-45% (On IABP) TEE 12/07/17 LVEF 45-50% (On IABP)  LHC 11/29/17 Left Main  Dist LM lesion 40% stenosed  Left Anterior Descending  Prox LAD-1 lesion is 60% stenosed. The lesion is calcified.  Prox LAD-2 lesion 80% stenosed  First Diagonal Branch  Vessel is small in size.  Ost 1st Diag to 1st Diag lesion 70% stenosed  Second Diagonal Branch  Vessel is small in size. There is mild disease in the vessel.  Left Circumflex  Ost Cx lesion 30% stenosed  Prox Cx lesion is 80% stenosed. The lesion is calcified.  Prox Cx to Mid Cx lesion 80% stenosed  First Obtuse Marginal Branch  Vessel is small in size.  Ost 1st Mrg lesion 90% stenosed  Second Obtuse Marginal Branch  Ost 2nd Mrg to 2nd Mrg lesion 60% stenosed  Right Coronary Artery  Collaterals  Mid RCA filled by collaterals from Prox RCA.    Prox RCA to Mid RCA lesion is 100% stenosed. The lesion is chronically occluded with bridging and left-to-right collateral flow.  Right Posterior Descending Artery  Collaterals  RPDA filled by collaterals from 2nd Sept.   Objective:    Vital Signs:   Temp:  [97.5 F (36.4 C)-99.4 F  (37.4 C)] 97.5 F (36.4 C) (12/19 0807) Pulse Rate:  [81-106] 106 (12/19 0807) Resp:  [22-30] 28 (12/19 0807) BP: (122-145)/(72-97) 123/97 (12/19 0807) SpO2:  [92 %-100 %] 100 % (12/19 0807) Weight:  [153 lb 8 oz (69.6 kg)] 153 lb 8 oz (69.6 kg) (12/19 0403) Last BM Date: 12/11/17  Weight change: Filed Weights   12/10/17 0500 12/11/17 0600 12/12/17 0403  Weight: 156 lb 4.9 oz (70.9 kg) 151 lb 7.3 oz (68.7 kg) 153 lb 8 oz (69.6 kg)    Intake/Output:   Intake/Output Summary (Last 24 hours) at 12/12/2017 0916 Last data filed at 12/11/2017 1400 Gross per 24 hour  Intake 240 ml  Output 600 ml  Net -360 ml    Physical Exam   General: Well appearing. No resp difficulty. HEENT: Normal Neck: Supple. JVP 5-6. Carotids 2+ bilat; no bruits. No thyromegaly or nodule noted. Cor: PMI nondisplaced. RRR, No M/G/R noted Lungs: CTAB, normal effort. Abdomen: Soft, non-tender, non-distended, no HSM. No bruits or masses. +BS  Extremities: No cyanosis, clubbing, or rash. R and LLE no edema.  Neuro: Alert & orientedx3, cranial nerves grossly intact. moves all 4 extremities w/o difficulty. Affect pleasant   Telemetry   NSR 90-100s, personally reviewed.   Labs   Basic Metabolic Panel: Recent Labs  Lab 12/07/17 2033  12/08/17 0411 12/08/17 1554 12/08/17 1607 12/09/17 0428 12/10/17 0425 12/11/17 0455  12/12/17 0538  NA  --    < > 138  --  141 136 139 139 139  K  --    < > 4.1  --  3.6 4.1 3.7 3.3* 3.1*  CL  --    < > 109  --  103 106 105 104 105  CO2  --   --  21*  --   --  22 27 26 24   GLUCOSE  --    < > 130*  --  120* 123* 120* 118* 102*  BUN  --    < > 8  --  7 8 7 7 9   CREATININE 0.85   < > 0.92 0.87 0.80 0.83 0.81 0.90 0.97  CALCIUM  --   --  8.0*  --   --  8.1* 8.2* 7.9* 8.4*  MG 2.8*  --  2.2 2.1  --   --   --   --   --    < > = values in this interval not displayed.    Liver Function Tests: Recent Labs  Lab 12/06/17 0503 12/07/17 0400  AST 22 23  ALT 22 29    ALKPHOS 32* 40  BILITOT 0.6 0.7  PROT 5.6* 5.6*  ALBUMIN 2.8* 3.0*   No results for input(s): LIPASE, AMYLASE in the last 168 hours. No results for input(s): AMMONIA in the last 168 hours.  CBC: Recent Labs  Lab 12/08/17 1554 12/08/17 1607 12/09/17 0428 12/10/17 0425 12/11/17 0455 12/12/17 0538  WBC 10.3  --  13.3* 13.4* 10.7* 8.7  NEUTROABS  --   --  11.6* 11.2* 8.4* 6.3  HGB 10.3* 9.9* 10.6* 10.7* 10.3* 10.9*  HCT 30.1* 29.0* 31.9* 31.6* 31.0* 33.0*  MCV 92.3  --  93.0 92.7 93.4 93.2  PLT 162  --  159 163 218 244   Cardiac Enzymes: No results for input(s): CKTOTAL, CKMB, CKMBINDEX, TROPONINI in the last 168 hours.  BNP: BNP (last 3 results) No results for input(s): BNP in the last 8760 hours.  ProBNP (last 3 results) No results for input(s): PROBNP in the last 8760 hours.  CBG: Recent Labs  Lab 12/09/17 2308 12/10/17 0349 12/10/17 0746 12/10/17 1125 12/10/17 1546  GLUCAP 105* 98 97 124* 96   Coagulation Studies: No results for input(s): LABPROT, INR in the last 72 hours.  Imaging   Dg Chest 2 View  Result Date: 12/12/2017 CLINICAL DATA:  56 year old male postoperative day 5 status post CABG. EXAM: CHEST  2 VIEW COMPARISON:  12/11/2017 and earlier. FINDINGS: PA and lateral views. Left PICC line has been removed. Epicardial pacer wires remain. Stable cardiac size and mediastinal contours. Small to moderate left and small right pleural effusions with associated bibasilar opacity. No pulmonary edema. No pneumothorax. Negative visible bowel gas pattern. Visualized tracheal air column is within normal limits. IMPRESSION: 1. Left PICC line removed.  Epicardial pacer wires remain. 2. Small to moderate left and small right pleural effusions. Electronically Signed   By: Odessa Fleming M.D.   On: 12/12/2017 07:40    Medications:    Current Medications: . acetaminophen  1,000 mg Oral Q6H   Or  . acetaminophen (TYLENOL) oral liquid 160 mg/5 mL  1,000 mg Per Tube Q6H  .  amiodarone  200 mg Oral BID  . aspirin EC  325 mg Oral Daily  . atorvastatin  80 mg Oral q1800  . bisacodyl  10 mg Oral Daily   Or  . bisacodyl  10 mg  Rectal Daily  . chlorhexidine  15 mL Mouth Rinse BID  . Chlorhexidine Gluconate Cloth  6 each Topical Daily  . digoxin  0.125 mg Oral Daily  . docusate sodium  200 mg Oral Daily  . enoxaparin (LOVENOX) injection  40 mg Subcutaneous Q24H  . feeding supplement (ENSURE ENLIVE)  237 mL Oral TID BM  . feeding supplement (PRO-STAT SUGAR FREE 64)  30 mL Oral BID WC  . folic acid  1 mg Oral Daily  . furosemide  20 mg Oral Daily  . levalbuterol  0.63 mg Nebulization TID  . losartan  25 mg Oral BID  . mouth rinse  15 mL Mouth Rinse q12n4p  . metoCLOPramide (REGLAN) injection  10 mg Intravenous Q6H  . mupirocin ointment   Nasal BID  . pantoprazole  40 mg Oral Daily  . potassium chloride  40 mEq Oral BID  . sodium chloride flush  3 mL Intravenous Q12H  . spironolactone  25 mg Oral Daily  . thiamine  100 mg Oral Daily    Infusions: . sodium chloride    . sodium chloride    . sodium chloride    . piperacillin-tazobactam (ZOSYN)  IV 3.375 g (12/12/17 0240)  . vancomycin Stopped (12/11/17 2202)    Patient Profile   Briant CedarWesley Clay Godsey is a 56 y.o. male with unknown medical history who [facility] after VT/VF arrest. Intubated emergently in ER. Chest CT with RUL collapse. Pt underwent bronch and taken emergently to cath lab.  Cath showed Severe 3v CAD and elevated LVEDP. IABP placed and started on milrinone with low output CHF.   On 12/10 IABP removed and had recurrent VF. IABP replaced.  Assessment/Plan   1. Cardiogenic shock: EF 11/30/17 25-30% by echo due to iCM.  S/p CABG 12/14.  - IABP removed 12/15 and norepinephrine/epinephrine stopped.   - Volume status stable.  - Weight stable. - Coox 52.3% off milrinone. PICC line removed.  - Continue losartan 25 mg BID. Can consider entresto as tolerated. Will not today with  lightheadedness  - Continue spiro 25 mg daily.  - Continue digoxin 0.125 mg daily.  - Start coreg 3.125 mg BID  2. CAD: Severe 3vD as above.  S/p CABG on 12/14.  - Continue ASA/statin. No s/s of ischemia.    3. ID: ?left basilar infiltrate.   - Tmax 99.4. Had been off vancomyin since 12/7 and Zosyn since 12/13, but Vancomycin/Zosyn resumed 12/09/17.  - Acceptable sputum specimen not obtained. - UA negative. UCx negative - BCx NG x 3 days.  - Can likely narrow ABX.  4. Severe ETOH abuse - CIWA protocol. No change.  5. VT: No further VT/NSVT.   - Now on po amiodarone. No change.   Continue to adjust meds. Likely home later this week.   Length of Stay: [address]13  Graciella FreerMichael Andrew Tillery, [place]PA-[place]  12/12/2017, 9:16 AM  Advanced Heart Failure Team Pager [phone] (M-F; 7a - 4p)  Please contact [facility] for night-coverage after hours (4p -7a ) and weekends on [email or web address]  Patient seen and examined with the above-signed Advanced Practice Provider and/or Housestaff. I personally reviewed laboratory data, imaging studies and relevant notes. I independently examined the patient and formulated the important aspects of the plan. I have edited the note to reflect any of my changes or salient points. I have personally discussed the plan with the patient and/or family.  Overall doing fairly well. Co-ox marginal yesterday. PICC now out. Mild lightheadedness. BP soft  but ok. Rhythms stable.  Continue to ambulate.   Would be careful with carvedilol. Will follow. Can change abx to levaquin for 3 more days.   Arvilla Meresaniel Didier Brandenburg, MD  3:04 PM

## 2017-12-12 NOTE — Progress Notes (Signed)
Heart Failure Navigator Consult Note  Presentation: Per Joanell RisingAndy [facility]illery--[facility] Marvis MoellerClay Honse is a 56 y.o. male with unknown medical history who [facility] after VT/VF arrest. Intubated emergently in ER. Chest CT with RUL collapse. Pt underwent bronch and taken emergently to cath lab.  Cath showed Severe 3v CAD and elevated LVEDP. IABP placed and started on milrinone with low output CHF   Social History   Socioeconomic History  . Marital status: None    Spouse name: None  . Number of children: None  . Years of education: None  . Highest education level: None  Social Needs  . Financial resource strain: None  . Food insecurity - worry: None  . Food insecurity - inability: None  . Transportation needs - medical: None  . Transportation needs - non-medical: None  Occupational History  . None  Tobacco Use  . Smoking status: Never Smoker  . Smokeless tobacco: Never Used  Substance and Sexual Activity  . Alcohol use: None  . Drug use: No  . Sexual activity: Not Currently  Other Topics Concern  . None  Social History Narrative  . None    ECHO:Study Conclusions--12/04/17  - Left ventricle: The cavity size was mildly dilated. Wall   thickness was increased in a pattern of mild LVH. Systolic   function was mildly to moderately reduced. The estimated ejection   fraction was in the range of 40% to 45%. There is akinesis of the   basal-midinferolateral and inferior myocardium. Features are   consistent with a pseudonormal left ventricular filling pattern,   with concomitant abnormal relaxation and increased filling   pressure (grade 2 diastolic dysfunction). - Mitral valve: Calcified annulus. - Right ventricle: The cavity size was mildly dilated.  Impressions:  - Akinesis of the basal inferior and inferolateral walls with   overall mild to moderate LV systolic dysfunction; moderate   diastolic dysfunction; mild LVH; mild LVE; mild RVE.  BNP No results found  for: BNP  ProBNP No results found for: PROBNP   Education Assessment and Provision:  Detailed education and instructions provided on heart failure disease management including the following:  Signs and symptoms of Heart Failure When to call the physician Importance of daily weights Low sodium diet Fluid restriction Medication management Anticipated future follow-up appointments  Patient education given on each of the above topics.  Patient acknowledges understanding and acceptance of all instructions.  I spoke with the patient regarding his hospitalization and HF recommendations going forward.  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  His brother is coming to live with him in order to "work" so that he can retain his home.  He does not currently have insurance and says that without assistance -he will not be able to get medications at the time of discharge. I will provide HF medications through the [facility] at discharge.  I will also refer him to [facility] for ongoing financial assistance and help with ? Medicaid/Disability application.  I have also provided a scale and discussed the importance of daily weights and when to call the physician.  I  reviewed a low sodium diet and high sodium foods to avoid.  He admits that this is something that he will need to "work on".  He will follow with the [facility] as well as [facility] after discharge.     Education Materials:  "Living  Better With Heart Failure" Booklet, Daily Weight Tracker Tool    High Risk Criteria for Readmission and/or Poor Patient Outcomes:    EF <30%- No 40-45% with grade 2 dias  2 or more admissions in 6 months-No  Difficult social situation- Yes- patient with no insurance noted.  Demonstrates medication noncompliance- denies--however no home meds taken prior to admit   Barriers of Care:  Questionable insight into his health, health  literacy, knowledge and compliance  Discharge Planning:   Plans to return to home in [place].  His brother plans to come to stay with patient indefinitely.  He would benefit from referral to the [facility] due to limited insight and high risk for readmission.  I will send referral to Paramedic team.

## 2017-12-12 NOTE — Discharge Instructions (Signed)
Coronary Artery Bypass Grafting, Care After °This sheet gives you information about how to care for yourself after your procedure. Your health care provider may also give you more specific instructions. If you have problems or questions, contact your health care provider. °What can I expect after the procedure? °After the procedure, it is common to have: °· Nausea and a lack of appetite. °· Constipation. °· Weakness and fatigue. °· Depression or irritability. °· Pain or discomfort in your incision areas. ° °Follow these instructions at home: °Medicines °· Take over-the-counter and prescription medicines only as told by your health care provider. Do not stop taking medicines or start any new medicines without approval from your health care provider. °· If you were prescribed an antibiotic medicine, take it as told by your health care provider. Do not stop taking the antibiotic even if you start to feel better. °· Do not drive or use heavy machinery while taking prescription pain medicine. °Incision care °· Follow instructions from your health care provider about how to take care of your incisions. Make sure you: °? Wash your hands with soap and water before you change your bandage (dressing). If soap and water are not available, use hand sanitizer. °? Change your dressing as told by your health care provider. °? Leave stitches (sutures), skin glue, or adhesive strips in place. These skin closures may need to stay in place for 2 weeks or longer. If adhesive strip edges start to loosen and curl up, you may trim the loose edges. Do not remove adhesive strips completely unless your health care provider tells you to do that. °· Keep incision areas clean, dry, and protected. °· Check your incision areas every day for signs of infection. Check for: °? More redness, swelling, or pain. °? More fluid or blood. °? Warmth. °? Pus or a bad smell. °· If incisions were made in your legs: °? Avoid crossing your legs. °? Avoid  sitting for long periods of time. Change positions every 30 minutes. °? Raise (elevate) your legs when you are sitting. °Bathing °· Do not take baths, swim, or use a hot tub until your health care provider approves. °· Only take sponge baths. Pat the incisions dry. Do not rub incisions with a washcloth or towel. °· Ask your health care provider when you can shower. °Eating and drinking °· Eat foods that are high in fiber, such as raw fruits and vegetables, whole grains, beans, and nuts. Meats should be lean cut. Avoid canned, processed, and fried foods. This can help prevent constipation and is a recommended part of a heart-healthy diet. °· Drink enough fluid to keep your urine clear or pale yellow. °· Limit alcohol intake to no more than 1 drink a day for nonpregnant women and 2 drinks a day for men. One drink equals 12 oz of beer, 5 oz of wine, or 1½ oz of hard liquor. °Activity °· Rest and limit your activity as told by your health care provider. You may be instructed to: °? Stop any activity right away if you have chest pain, shortness of breath, irregular heartbeats, or dizziness. Get help right away if you have any of these symptoms. °? Move around frequently for short periods or take short walks as directed by your health care provider. Gradually increase your activities. You may need physical therapy or cardiac rehabilitation to help strengthen your muscles and build your endurance. °? Avoid lifting, pushing, or pulling anything that is heavier than 10 lb (4.5 kg) for at   least 6 weeks or as told by your health care provider. °· Do not drive until your health care provider approves. °· Ask your health care provider when you may return to work. °· Ask your health care provider when you may resume sexual activity. °General instructions °· Do not use any products that contain nicotine or tobacco, such as cigarettes and e-cigarettes. If you need help quitting, ask your health care provider. °· Take 2-3 deep  breaths every few hours during the day, while you recover. This helps expand your lungs and prevent complications like pneumonia after surgery. °· If you were given a device called an incentive spirometer, use it several times a day to practice deep breathing. Support your chest with a pillow or your arms when you take deep breaths or cough. °· Wear compression stockings as told by your health care provider. These stockings help to prevent blood clots and reduce swelling in your legs. °· Weigh yourself every day. This helps identify if your body is holding (retaining) fluid that may make your heart and lungs work harder. °· Keep all follow-up visits as told by your health care provider. This is important. °Contact a health care provider if: °· You have more redness, swelling, or pain around any incision. °· You have more fluid or blood coming from any incision. °· Any incision feels warm to the touch. °· You have pus or a bad smell coming from any incision °· You have a fever. °· You have swelling in your ankles or legs. °· You have pain in your legs. °· You gain 2 lb (0.9 kg) or more a day. °· You are nauseous or you vomit. °· You have diarrhea. °Get help right away if: °· You have chest pain that spreads to your jaw or arms. °· You are short of breath. °· You have a fast or irregular heartbeat. °· You notice a "clicking" in your breastbone (sternum) when you move. °· You have numbness or weakness in your arms or legs. °· You feel dizzy or light-headed. °Summary °· After the procedure, it is common to have pain or discomfort in the incision areas. °· Do not take baths, swim, or use a hot tub until your health care provider approves. °· Gradually increase your activities. You may need physical therapy or cardiac rehabilitation to help strengthen your muscles and build your endurance. °· Weigh yourself every day. This helps identify if your body is holding (retaining) fluid that may make your heart and lungs work  harder. °This information is not intended to replace advice given to you by your health care provider. Make sure you discuss any questions you have with your health care provider. °Document Released: 06/30/2005 Document Revised: 10/30/2016 Document Reviewed: 10/30/2016 °Elsevier Interactive Patient Education © 2018 [employer] ° ° °Endoscopic Saphenous Vein Harvesting, Care After °Refer to this sheet in the next few weeks. These instructions provide you with information about caring for yourself after your procedure. Your health care provider may also give you more specific instructions. Your treatment has been planned according to current medical practices, but problems sometimes occur. Call your health care provider if you have any problems or questions after your procedure. °What can I expect after the procedure? °After the procedure, it is common to have: °· Pain. °· Bruising. °· Swelling. °· Numbness. ° °Follow these instructions at home: °Medicine °· Take over-the-counter and prescription medicines only as told by your health care provider. °· Do not drive or operate heavy machinery   while taking prescription pain medicine. °Incision care ° °· Follow instructions from your health care provider about how to take care of the cut made during surgery (incision). Make sure you: °? Wash your hands with soap and water before you change your bandage (dressing). If soap and water are not available, use hand sanitizer. °? Change your dressing as told by your health care provider. °? Leave stitches (sutures), skin glue, or adhesive strips in place. These skin closures may need to be in place for 2 weeks or longer. If adhesive strip edges start to loosen and curl up, you may trim the loose edges. Do not remove adhesive strips completely unless your health care provider tells you to do that. °· Check your incision area every day for signs of infection. Check for: °? More redness, swelling, or pain. °? More fluid or  blood. °? Warmth. °? Pus or a bad smell. °General instructions °· Raise (elevate) your legs above the level of your heart while you are sitting or lying down. °· Do any exercises your health care providers have given you. These may include deep breathing, coughing, and walking exercises. °· Do not shower, take baths, swim, or use a hot tub unless told by your health care provider. °· Wear your elastic stocking if told by your health care provider. °· Keep all follow-up visits as told by your health care provider. This is important. °Contact a health care provider if: °· Medicine does not help your pain. °· Your pain gets worse. °· You have new leg bruises or your leg bruises get bigger. °· You have a fever. °· Your leg feels numb. °· You have more redness, swelling, or pain around your incision. °· You have more fluid or blood coming from your incision. °· Your incision feels warm to the touch. °· You have pus or a bad smell coming from your incision. °Get help right away if: °· Your pain is severe. °· You develop pain, tenderness, warmth, redness, or swelling in any part of your leg. °· You have chest pain. °· You have trouble breathing. °This information is not intended to replace advice given to you by your health care provider. Make sure you discuss any questions you have with your health care provider. °Document Released: 08/23/2011 Document Revised: 05/18/2016 Document Reviewed: 10/25/2015 °Elsevier Interactive Patient Education © 2018 Elsevier Inc. ° ° °

## 2017-12-12 NOTE — Plan of Care (Signed)
  Education: Knowledge of General Education information will improve 12/12/2017 2143 - Progressing by Renelda MomWorley, Kaula Klenke L, RN   Health Behavior/Discharge Planning: Ability to manage health-related needs will improve 12/12/2017 2143 - Progressing by Renelda MomWorley, Ellyce Lafevers L, RN

## 2017-12-12 NOTE — Progress Notes (Signed)
Pharmacy Antibiotic Note  [name] is a 56 y.o. male admitted on 11/29/2017 with cardiac arrest treated with 13 days of Zosyn for aspiration pneumonia.  Pharmacy has been consulted for Levaquin dosing to finish 3 more days. WBC has trended down to 8.7. Afebrile. CrCl ~ 85 mL/min.   Plan: -Levaquin 750 mg IV Q 24 hours x 3 days -Pharmacy to sign off since end date in place and no further drug dosage adjustment needed  Height: _0  (177.8 cm) Weight: 153 lb 8 oz (69.6 kg) IBW/kg (Calculated) : 73  Temp (24hrs), Avg:98.4 F (36.9 C), Min:97.5 F (36.4 C), Max:99.4 F (37.4 C)  Recent Labs  Lab 12/08/17 1554 12/08/17 1607 12/09/17 0428 12/10/17 0425 12/11/17 0455 12/12/17 0538  WBC 10.3  --  13.3* 13.4* 10.7* 8.7  CREATININE 0.87 0.80 0.83 0.81 0.90 0.97    Estimated Creatinine Clearance: 85.7 mL/min (by C-G formula based on SCr of 0.97 mg/dL).    No Known Allergies  Antimicrobials this admission: 12/6 Vanc >> 12/7 restarted 12/14 for pre/post op ABX stops 12/15 restart > 12/16 > 12/19 12/6 Zosyn >>_1 Levaquin  Dose adjustments this admission: None   Microbiology results: 12/16 sputum: neg 12/15 BCx x2: NGTD 12/6 BCx x 2: NGTD 12/7 UCx: neg 12/6 BAL: ngF 12/6 MRSA PCR: neg  Thank you for allowing pharmacy to be a part of this patient's care.  Albertina Parr, PharmD., BCPS Clinical Pharmacist Pager [phone]

## 2017-12-12 NOTE — Progress Notes (Signed)
Pt is able to do flutter on his own.  He is working with it and the incentive spirometer.

## 2017-12-12 NOTE — Progress Notes (Signed)
Nutrition Follow-up  DOCUMENTATION CODES:   Not applicable  INTERVENTION:    Continue Ensure Enlive po BID, each supplement provides 350 kcal and 20 grams of protein  Continue Prostat liquid protein po 30 ml BID with meals, each supplement provides 100 kcal, 15 grams protein  NUTRITION DIAGNOSIS:   Inadequate oral intake related to acute illness as evidenced by NPO status, ongoing  GOAL:   Patient will meet greater than or equal to 90% of their needs, met  MONITOR:   PO intake, Supplement acceptance, Labs, Weight trends, I & O's  ASSESSMENT:   56 yo male with no known PMH admitted after he collapsed chopping firewood. Pt with VF arrest, VDRF post arrest requiring intubation, RUL collapse   12/09 Self-Extubated 12/10 IABP removed and had recurrent VF arrest, IABP replaced  Pt s/p CABG procedure 12/14.  Extubated 12/15. PO intake 100% per flowsheet records. Has Ensure Enlive & Prostat supplements ordered. Labs and medications reviewed. CBG's 97-124-96.  Diet Order:  Diet 2 gram sodium Room service appropriate? Yes; Fluid consistency: Thin  EDUCATION NEEDS:   Education needs have been addressed  Skin:  Skin Assessment: Reviewed RN Assessment  Last BM:  12/18  Height:   Ht Readings from Last 1 Encounters:  12/06/17 5' 10" (1.778 m)   Weight:   Wt Readings from Last 1 Encounters:  12/12/17 153 lb 8 oz (69.6 kg)   Ideal Body Weight:  72.7 kg  BMI:  Body mass index is 22.02 kg/m.  Estimated Nutritional Needs:   Kcal:  2000-2200  Protein:  105-120 gm  Fluid:  2.0-2.2 L  Katie , RD, LDN Pager #: [phone] After-Hours Pager #: [phone]   

## 2017-12-13 DIAGNOSIS — I2584 Coronary atherosclerosis due to calcified coronary lesion: Secondary | ICD-10-CM

## 2017-12-13 LAB — CULTURE, BLOOD (ROUTINE X 2)
CULTURE: NO GROWTH
Culture: NO GROWTH
Special Requests: ADEQUATE
Special Requests: ADEQUATE

## 2017-12-13 LAB — CBC WITH DIFFERENTIAL/PLATELET
BASOS ABS: 0 10*3/uL (ref 0.0–0.1)
BASOS PCT: 1 %
EOS ABS: 0.2 10*3/uL (ref 0.0–0.7)
EOS PCT: 3 %
HCT: 31.1 % — ABNORMAL LOW (ref 39.0–52.0)
Hemoglobin: 10.3 g/dL — ABNORMAL LOW (ref 13.0–17.0)
Lymphocytes Relative: 18 %
Lymphs Abs: 1.2 10*3/uL (ref 0.7–4.0)
MCH: 31.1 pg (ref 26.0–34.0)
MCHC: 33.1 g/dL (ref 30.0–36.0)
MCV: 94 fL (ref 78.0–100.0)
MONO ABS: 0.7 10*3/uL (ref 0.1–1.0)
MONOS PCT: 10 %
NEUTROS ABS: 4.8 10*3/uL (ref 1.7–7.7)
Neutrophils Relative %: 68 %
PLATELETS: 315 10*3/uL (ref 150–400)
RBC: 3.31 MIL/uL — ABNORMAL LOW (ref 4.22–5.81)
RDW: 13.1 % (ref 11.5–15.5)
WBC: 6.9 10*3/uL (ref 4.0–10.5)

## 2017-12-13 LAB — BASIC METABOLIC PANEL
ANION GAP: 9 (ref 5–15)
BUN: 7 mg/dL (ref 6–20)
CALCIUM: 8.3 mg/dL — AB (ref 8.9–10.3)
CO2: 23 mmol/L (ref 22–32)
CREATININE: 0.91 mg/dL (ref 0.61–1.24)
Chloride: 107 mmol/L (ref 101–111)
Glucose, Bld: 97 mg/dL (ref 65–99)
Potassium: 3.6 mmol/L (ref 3.5–5.1)
Sodium: 139 mmol/L (ref 135–145)

## 2017-12-13 MED ORDER — POTASSIUM CHLORIDE CRYS ER 20 MEQ PO TBCR: 20.0000 meq | EXTENDED_RELEASE_TABLET | Freq: Every day | ORAL | Status: DC

## 2017-12-13 MED ORDER — LEVOFLOXACIN 750 MG PO TABS: 750.0000 mg | ORAL_TABLET | ORAL | 0 refills | Status: DC

## 2017-12-13 MED ORDER — OXYCODONE HCL 5 MG PO TABS: 5.0000 mg | ORAL_TABLET | ORAL | 0 refills | Status: DC | PRN

## 2017-12-13 MED ORDER — SPIRONOLACTONE 25 MG PO TABS: 25.0000 mg | ORAL_TABLET | Freq: Every day | ORAL | 3 refills | Status: DC

## 2017-12-13 MED ORDER — POTASSIUM CHLORIDE CRYS ER 20 MEQ PO TBCR
40.0000 meq | EXTENDED_RELEASE_TABLET | Freq: Once | ORAL | Status: AC
  Administered 2017-12-13: 40 meq via ORAL
  Filled 2017-12-13: qty 2

## 2017-12-13 MED ORDER — ASPIRIN 325 MG PO TBEC: 325.0000 mg | DELAYED_RELEASE_TABLET | Freq: Every day | ORAL | 0 refills | Status: DC

## 2017-12-13 MED ORDER — DIGOXIN 125 MCG PO TABS: 0.1250 mg | ORAL_TABLET | Freq: Every day | ORAL | 3 refills | Status: DC

## 2017-12-13 MED ORDER — SACUBITRIL-VALSARTAN 24-26 MG PO TABS: 1.0000 | ORAL_TABLET | Freq: Two times a day (BID) | ORAL | 3 refills | Status: DC

## 2017-12-13 MED ORDER — LEVOFLOXACIN 750 MG PO TABS
750.0000 mg | ORAL_TABLET | ORAL | Status: DC
  Administered 2017-12-13: 750 mg via ORAL
  Filled 2017-12-13: qty 1

## 2017-12-13 MED ORDER — CARVEDILOL 3.125 MG PO TABS: 3.1250 mg | ORAL_TABLET | Freq: Two times a day (BID) | ORAL | 3 refills | Status: DC

## 2017-12-13 MED ORDER — ATORVASTATIN CALCIUM 80 MG PO TABS: 80.0000 mg | ORAL_TABLET | Freq: Every day | ORAL | 3 refills | Status: DC

## 2017-12-13 MED ORDER — AMIODARONE HCL 200 MG PO TABS: 200.0000 mg | ORAL_TABLET | Freq: Two times a day (BID) | ORAL | 1 refills | Status: DC

## 2017-12-13 MED ORDER — SACUBITRIL-VALSARTAN 24-26 MG PO TABS
1.0000 | ORAL_TABLET | Freq: Two times a day (BID) | ORAL | Status: DC
  Filled 2017-12-13: qty 1

## 2017-12-13 MED FILL — levoFLOXacin 750 MG TABS: 750 | 1 days supply | Qty: 1 | Fill #0

## 2017-12-13 MED FILL — DIGOXIN 0.125 MG TABLET: 125 | 34 days supply | Qty: 34 | Fill #0

## 2017-12-13 MED FILL — AMIODARONE HCL 200 MG TAB: 200 | 34 days supply | Qty: 41 | Fill #0

## 2017-12-13 MED FILL — ENTRESTO 24 MG-26 MG TABLET: 24-26 | 30 days supply | Qty: 60 | Fill #0

## 2017-12-13 MED FILL — ASPIRIN ADULT LOW STRENGTH: 81 | 34 days supply | Qty: 34 | Fill #0

## 2017-12-13 MED FILL — CARVEDILOL 3.125 MG TABLET: 3.125 | 34 days supply | Qty: 68 | Fill #0

## 2017-12-13 MED FILL — ATORVASTATIN 80 MG TABLET: 80 | 34 days supply | Qty: 34 | Fill #0

## 2017-12-13 MED FILL — SPIRONOLACTONE 25 MG TABLET: 25 | 34 days supply | Qty: 34 | Fill #0

## 2017-12-13 NOTE — Care Management Note (Signed)
Case Management Note  Patient Details  Name: [name] MRN: [id number] Date of Birth: 07/13/1961               Action/Plan: CM talked to patient about HHC choice, pt chose [facility]; [facility]. Also ordered a cane for home. Medication provided through [facility].  Expected Discharge Date:   12/13/2017               Expected Discharge Plan:  Home w Home Health Services  Discharge planning Services  CM Consult  Choice offered to:  Patient  DME Arranged:  [facility] DME Agency:  [facility]  HH Arranged:  RN, [facility]  [facility]  Status of Service:  In process, will continue to follow  Reola MosherChandler, Peola Joynt L, RN,MHA,BSN [phone] 12/13/2017, 11:57 AM

## 2017-12-13 NOTE — Progress Notes (Signed)
Pt discharging home brother.  All instructions and medications given and reviewed.  Pt has concerns about getting to follow up appointments.  Emphasised importance of consistent follow up care and encouraged to access community resources.

## 2017-12-13 NOTE — Progress Notes (Signed)
Patient ID: [name], male   DOB: 01/08/1961, 56 y.o.   MRN: [id number]    Advanced Heart Failure Progress   Primary Cardiologist:  [facility] (Dr. Okey DupreEnd) Primary HF: New (Dr. [name])   HPI:    IABP removed 12/10 and he had recurrent VF arrest. IABP replaced.  CABG 12/14.   IABP removed 12/15.  Extubated 12/15.  Swan pulled 12/09/17.  No further VT/NSVT. Off norepinephrine and epinephrine.  Coox 52.3% off milrinone 12/11/17. PICC line pulled.   Tmax 98.7. CXR 12/12/17  with small to moderate left and small right pleural effusion.  WBC 13.3 -> 13.4 -> 10.7 -> 8.7 -> 6.9  Feeling great this am. Anxious to go home. Denies SOB or lightheadedness walking the halls. No CP. SBP ~120.   TTE 11/30/17 25-30% RV normal.  TTE 12/04/2017 LVEF 40-45% (On IABP) TEE 12/07/17 LVEF 45-50% (On IABP)  LHC 11/29/17 Left Main  Dist LM lesion 40% stenosed  Left Anterior Descending  Prox LAD-1 lesion is 60% stenosed. The lesion is calcified.  Prox LAD-2 lesion 80% stenosed  First Diagonal Branch  Vessel is small in size.  Ost 1st Diag to 1st Diag lesion 70% stenosed  Second Diagonal Branch  Vessel is small in size. There is mild disease in the vessel.  Left Circumflex  Ost Cx lesion 30% stenosed  Prox Cx lesion is 80% stenosed. The lesion is calcified.  Prox Cx to Mid Cx lesion 80% stenosed  First Obtuse Marginal Branch  Vessel is small in size.  Ost 1st Mrg lesion 90% stenosed  Second Obtuse Marginal Branch  Ost 2nd Mrg to 2nd Mrg lesion 60% stenosed  Right Coronary Artery  Collaterals  Mid RCA filled by collaterals from Prox RCA.    Prox RCA to Mid RCA lesion is 100% stenosed. The lesion is chronically occluded with bridging and left-to-right collateral flow.  Right Posterior Descending Artery  Collaterals  RPDA filled by collaterals from 2nd Sept.   Objective:    Vital Signs:   Temp:  [97.8 F (36.6 C)-98.7 F (37.1 C)] 97.8 F (36.6 C) (12/20 0439) Pulse Rate:   [83-90] 83 (12/20 0439) Resp:  [18-31] 28 (12/20 0439) BP: (110-144)/(61-86) 144/80 (12/20 0439) SpO2:  [91 %-100 %] 94 % (12/20 0439) Weight:  [149 lb 14.4 oz (68 kg)] 149 lb 14.4 oz (68 kg) (12/20 0442) Last BM Date: 12/11/17  Weight change: Filed Weights   12/11/17 0600 12/12/17 0403 12/13/17 0442  Weight: 151 lb 7.3 oz (68.7 kg) 153 lb 8 oz (69.6 kg) 149 lb 14.4 oz (68 kg)    Intake/Output:   Intake/Output Summary (Last 24 hours) at 12/13/2017 0908 Last data filed at 12/12/2017 1246 Gross per 24 hour  Intake 240 ml  Output -  Net 240 ml    Physical Exam   General: Well appearing. No resp difficulty. HEENT: Normal Neck: Supple. JVP 5-6. Carotids 2+ bilat; no bruits. No thyromegaly or nodule noted. Cor: PMI nondisplaced. RRR, No M/G/R noted Lungs: CTAB, normal effort. Abdomen: Soft, non-tender, non-distended, no HSM. No bruits or masses. +BS  Extremities: No cyanosis, clubbing, or rash. R and LLE no edema.  Neuro: Alert & orientedx3, cranial nerves grossly intact. moves all 4 extremities w/o difficulty. Affect pleasant   Telemetry   NSR 90-100s, personally reviewed.   Labs   Basic Metabolic Panel: Recent Labs  Lab 12/07/17 2033  12/08/17 0411 12/08/17 1554  12/09/17 [id number] 12/10/17 0425 12/11/17 [id number] 12/12/17 [id number] 12/13/17 [id number]  NA  --    < > 138  --    < > 136 139 139 139 139  K  --    < > 4.1  --    < > 4.1 3.7 3.3* 3.1* 3.6  CL  --    < > 109  --    < > 106 105 104 105 107  CO2  --   --  21*  --   --  22 27 26 24 23   GLUCOSE  --    < > 130*  --    < > 123* 120* 118* 102* 97  BUN  --    < > 8  --    < > 8 7 7 9 7   CREATININE 0.85   < > 0.92 0.87   < > 0.83 0.81 0.90 0.97 0.91  CALCIUM  --   --  8.0*  --   --  8.1* 8.2* 7.9* 8.4* 8.3*  MG 2.8*  --  2.2 2.1  --   --   --   --   --   --    < > = values in this interval not displayed.    Liver Function Tests: Recent Labs  Lab 12/07/17 0400  AST 23  ALT 29  ALKPHOS 40  BILITOT 0.7  PROT 5.6*    ALBUMIN 3.0*   No results for input(s): LIPASE, AMYLASE in the last 168 hours. No results for input(s): AMMONIA in the last 168 hours.  CBC: Recent Labs  Lab 12/09/17 0428 12/10/17 0425 12/11/17 0455 12/12/17 0538 12/13/17 0351  WBC 13.3* 13.4* 10.7* 8.7 6.9  NEUTROABS 11.6* 11.2* 8.4* 6.3 4.8  HGB 10.6* 10.7* 10.3* 10.9* 10.3*  HCT 31.9* 31.6* 31.0* 33.0* 31.1*  MCV 93.0 92.7 93.4 93.2 94.0  PLT 159 163 218 244 315   Cardiac Enzymes: No results for input(s): CKTOTAL, CKMB, CKMBINDEX, TROPONINI in the last 168 hours.  BNP: BNP (last 3 results) No results for input(s): BNP in the last 8760 hours.  ProBNP (last 3 results) No results for input(s): PROBNP in the last 8760 hours.  CBG: Recent Labs  Lab 12/09/17 2308 12/10/17 0349 12/10/17 0746 12/10/17 1125 12/10/17 1546  GLUCAP 105* 98 97 124* 96   Coagulation Studies: No results for input(s): LABPROT, INR in the last 72 hours.  Imaging   No results found.  Medications:    Current Medications: . amiodarone  200 mg Oral BID  . aspirin EC  325 mg Oral Daily  . atorvastatin  80 mg Oral q1800  . bisacodyl  10 mg Oral Daily   Or  . bisacodyl  10 mg Rectal Daily  . carvedilol  3.125 mg Oral BID WC  . chlorhexidine  15 mL Mouth Rinse BID  . Chlorhexidine Gluconate Cloth  6 each Topical Daily  . digoxin  0.125 mg Oral Daily  . docusate sodium  200 mg Oral Daily  . enoxaparin (LOVENOX) injection  40 mg Subcutaneous Q24H  . feeding supplement (ENSURE ENLIVE)  237 mL Oral TID BM  . feeding supplement (PRO-STAT SUGAR FREE 64)  30 mL Oral BID WC  . folic acid  1 mg Oral Daily  . furosemide  20 mg Oral Daily  . levalbuterol  0.63 mg Nebulization TID  . losartan  25 mg Oral BID  . mouth rinse  15 mL Mouth Rinse q12n4p  . mupirocin ointment   Nasal BID  . pantoprazole  40 mg Oral Daily  .  potassium chloride  20 mEq Oral Daily  . sodium chloride flush  3 mL Intravenous Q12H  . spironolactone  25 mg Oral Daily  .  thiamine  100 mg Oral Daily    Infusions: . sodium chloride    . sodium chloride    . sodium chloride    . levofloxacin [facility](LEVAQUIN) IV Stopped (12/12/17 2207)    Patient Profile   [name] is a 56 y.o. male with unknown medical history who [facility] after VT/VF arrest. Intubated emergently in ER. Chest CT with RUL collapse. Pt underwent bronch and taken emergently to cath lab.  Cath showed Severe 3v CAD and elevated LVEDP. IABP placed and started on milrinone with low output CHF.   On 12/10 IABP removed and had recurrent VF. IABP replaced.  Assessment/Plan   1. Cardiogenic shock: EF 11/30/17 25-30% by echo due to iCM.  S/p CABG 12/14.  - IABP removed 12/15 and norepinephrine/epinephrine stopped.   - Volume status stable.  - STOP lasix and K with switch to Entresto.  - Coox 52.3% off milrinone. PICC line removed.  - Stop losartan. Start Entresto 24/26 mg BID.  - Continue spiro 25 mg daily.  - Continue digoxin 0.125 mg daily.  - Continue coreg 3.125 mg BID for now.  2. CAD: Severe 3vD as above.  S/p CABG on 12/14.  - Continue ASA/statin. No s/s of ischemia.    3. ID: ?left basilar infiltrate.   - Afebrile.  Had been off vancomyin since 12/7 and Zosyn since 12/13, but Vancomycin/Zosyn resumed 12/09/17.  - Acceptable sputum specimen not obtained. - UA negative. UCx negative - BCx NGTD.  - Now on Levaquin. Will finish 12/14/17.  4. Severe ETOH abuse - CIWA protocol. No change.  5. VT: No further VT/NSVT.   - Now on po amiodarone. No change.   Doing well. Likely OK for home today vs tomorrow. Will finalize with MD.   HF medications for home Entresto 24/26 mg BID Spironoalctone 25 mg daily Digoxin 0.125 mg daily Coreg 3.125 mg BID Atorvastatin 80 mg daily Ecasa 81 mg daily Levaquin for 2 more days  HF clinic follow up made for 01/01/17 (First available)  Length of Stay: [address]  Michael Andrew Tillery, PA-C  12/13/2017, 9:08 AM  Advanced Heart  Failure Team Pager [phone] (M-F; 7a - 4p)  Please contact [facility] for night-coverage after hours (4p -7a ) and weekends on [email or web address]  Patient seen and examined with the above-signed Advanced Practice Provider and/or Housestaff. I personally reviewed laboratory data, imaging studies and relevant notes. I independently examined the patient and formulated the important aspects of the plan. I have edited the note to reflect any of my changes or salient points. I have personally discussed the plan with the patient and/or family.  Looks great this am. Will switch losartan to [employer]. Ok for d/c home. We discussed need for ETOH cessation. REfer CR. Will f/u in HF Clinic.   Arvilla Meresaniel Eion Timbrook, MD  12:02 PM

## 2017-12-13 NOTE — Progress Notes (Signed)
CARDIAC REHAB PHASE I   PRE:  Rate/Rhythm: 90 SR  Up In ROOM      MODE:  Ambulation: 800 ft   POST:  Rate/Rhythm: 102 ST  BP:  Supine:   Sitting: 176/79  leg  Standing:    SaO2: 97%RA 1110-1155 Pt walked 800 ft with steady gait. Tolerated well. CHF ed done with pt who voiced understanding. Will need reinforcement. Encouraged him to read materials. Pt remembered that he is to weigh daily to monitor fluid. Reviewed zones of when to call MD per CHF booklet. Gave low sodium diets and discussed 2000 mg sodium restriction. Discussed no alcohol. Pt stated he does not smoke. Brother will smoke outside. Reviewed IS, sternal precautions, ex ed, flutter valve and CRP 2. Pt and his brother do not drive. Pt has no transportation for CRP 2 so he did not want referral to program. Did not want to view discharge video.   Luetta Nuttingharlene Kerria Sapien, RN BSN  12/13/2017 11:48 AM

## 2017-12-13 NOTE — Progress Notes (Addendum)
The following medications were sent to be filled at [facility] through the [facility] per [facility].   Entresto 24/26 mg BID Spironoalctone 25 mg daily Digoxin 0.125 mg daily Coreg 3.125 mg BID Atorvastatin 80 mg daily Amiodarone 200 mg BID x 1 week then 200 daily ECASA 81 mg daily Levaquin 750 mg daily x 1 day.  Above medications delivered to patient's bedside. I have also referred patient for HF Community Paramedicine Program.  Johnnye SimaZach Shelton- HF Community Paramedic came to meet patient while he remains hospitalized and will see patient in his home after discharge.

## 2017-12-13 NOTE — Progress Notes (Addendum)
      [address]       [place] [id number]             [phone]      6 Days Post-Op Procedure(s) (LRB): CORONARY ARTERY BYPASS GRAFTING (CABG) times four using left internal mammary artery and right saphenous vein using endoscope for harvest. (N/A) TRANSESOPHAGEAL ECHOCARDIOGRAM (TEE) (N/A)   Subjective:  No new complaints.  He is up at the sink eating breakfast, wants to go home.  + ambulation  + BM  Objective: Vital signs in last 24 hours: Temp:  [97.5 F (36.4 C)-98.7 F (37.1 C)] 97.8 F (36.6 C) (12/20 0439) Pulse Rate:  [83-106] 83 (12/20 0439) Cardiac Rhythm: Normal sinus rhythm (12/19 2000) Resp:  [18-31] 28 (12/20 0439) BP: (110-144)/(61-97) 144/80 (12/20 0439) SpO2:  [91 %-100 %] 94 % (12/20 0439) Weight:  [149 lb 14.4 oz (68 kg)] 149 lb 14.4 oz (68 kg) (12/20 0442)  Intake/Output from previous day: 12/19 0701 - 12/20 0700 In: 240 [P.O.:240] Out: -   General appearance: alert, cooperative and no distress Heart: regular rate and rhythm Lungs: clear to auscultation bilaterally Abdomen: soft, non-tender; bowel sounds normal; no masses,  no organomegaly Extremities: edema 1+ Wound: clean and dry  Lab Results: Recent Labs    12/12/17 0538 12/13/17 0351  WBC 8.7 6.9  HGB 10.9* 10.3*  HCT 33.0* 31.1*  PLT 244 315   BMET:  Recent Labs    12/12/17 0538 12/13/17 0351  NA 139 139  K 3.1* 3.6  CL 105 107  CO2 24 23  GLUCOSE 102* 97  BUN 9 7  CREATININE 0.97 0.91  CALCIUM 8.4* 8.3*    PT/INR: No results for input(s): LABPROT, INR in the last 72 hours. ABG    Component Value Date/Time   PHART 7.382 12/09/2017 0450   HCO3 20.9 12/09/2017 0450   TCO2 22 12/09/2017 0450   ACIDBASEDEF 3.0 (H) 12/09/2017 0450   O2SAT 52.3 12/11/2017 0500   CBG (last 3)  Recent Labs    12/10/17 0746 12/10/17 1125 12/10/17 1546  GLUCAP 97 124* 96    Assessment/Plan: S/P Procedure(s) (LRB): CORONARY ARTERY BYPASS GRAFTING (CABG) times four  using left internal mammary artery and right saphenous vein using endoscope for harvest. (N/A) TRANSESOPHAGEAL ECHOCARDIOGRAM (TEE) (N/A)  1. CV- NSR with bigeminy- continue Coreg, Amiodarone, Digoxin, Cozaar per AHF 2. Pulm- no acute issues, off oxygen 3. Renal- creatinine stable, hypokalemia improved- on lasix and spironolactone 4. Dispo- patient with bigeminy this morning, potassium level improved will continue low dose supplementation, home once medications optimized by AHF team  LOS: 14 days    Erin Barrett 12/13/2017  CXR looks fine Incisions clean, dry Will need HHN at DC patient examined and medical record reviewed,agree with above note. Kathlee Nationseter Van Trigt III 12/13/2017

## 2017-12-14 ENCOUNTER — Telehealth (HOSPITAL_COMMUNITY): Payer: Self-pay | Admitting: Surgery

## 2017-12-14 NOTE — Telephone Encounter (Signed)
HF Community Paramedic to assess patient's transportation options and will inform clinic if taxi or other transportation options need to be utilized to insure that patient make his upcoming follow-up appts.

## 2017-12-20 ENCOUNTER — Emergency Department (HOSPITAL_COMMUNITY): Payer: Medicaid Other

## 2017-12-20 ENCOUNTER — Telehealth (HOSPITAL_COMMUNITY): Payer: Self-pay | Admitting: Surgery

## 2017-12-20 ENCOUNTER — Other Ambulatory Visit (HOSPITAL_COMMUNITY): Payer: Self-pay

## 2017-12-20 ENCOUNTER — Other Ambulatory Visit: Payer: Self-pay

## 2017-12-20 ENCOUNTER — Observation Stay (HOSPITAL_COMMUNITY)
Admission: EM | Admit: 2017-12-20 | Discharge: 2017-12-21 | Disposition: A | Discharge status: Home / Self Care | Payer: Medicaid Other | Attending: Internal Medicine | Admitting: Internal Medicine

## 2017-12-20 ENCOUNTER — Encounter (HOSPITAL_COMMUNITY): Payer: Self-pay | Admitting: Emergency Medicine

## 2017-12-20 DIAGNOSIS — W19XXXA Unspecified fall, initial encounter: Secondary | ICD-10-CM

## 2017-12-20 DIAGNOSIS — Z951 Presence of aortocoronary bypass graft: Secondary | ICD-10-CM | POA: Diagnosis not present

## 2017-12-20 DIAGNOSIS — R296 Repeated falls: Secondary | ICD-10-CM | POA: Diagnosis not present

## 2017-12-20 DIAGNOSIS — K5900 Constipation, unspecified: Secondary | ICD-10-CM | POA: Insufficient documentation

## 2017-12-20 DIAGNOSIS — J90 Pleural effusion, not elsewhere classified: Secondary | ICD-10-CM | POA: Insufficient documentation

## 2017-12-20 DIAGNOSIS — R0781 Pleurodynia: Secondary | ICD-10-CM

## 2017-12-20 DIAGNOSIS — I951 Orthostatic hypotension: Secondary | ICD-10-CM | POA: Diagnosis present

## 2017-12-20 DIAGNOSIS — N179 Acute kidney failure, unspecified: Secondary | ICD-10-CM

## 2017-12-20 DIAGNOSIS — E871 Hypo-osmolality and hyponatremia: Secondary | ICD-10-CM

## 2017-12-20 DIAGNOSIS — Z8674 Personal history of sudden cardiac arrest: Secondary | ICD-10-CM

## 2017-12-20 DIAGNOSIS — Z7982 Long term (current) use of aspirin: Secondary | ICD-10-CM | POA: Diagnosis not present

## 2017-12-20 DIAGNOSIS — R197 Diarrhea, unspecified: Secondary | ICD-10-CM | POA: Insufficient documentation

## 2017-12-20 DIAGNOSIS — I5042 Chronic combined systolic (congestive) and diastolic (congestive) heart failure: Secondary | ICD-10-CM | POA: Diagnosis not present

## 2017-12-20 DIAGNOSIS — E875 Hyperkalemia: Secondary | ICD-10-CM

## 2017-12-20 DIAGNOSIS — Z79899 Other long term (current) drug therapy: Secondary | ICD-10-CM | POA: Diagnosis not present

## 2017-12-20 DIAGNOSIS — I252 Old myocardial infarction: Secondary | ICD-10-CM

## 2017-12-20 DIAGNOSIS — I2510 Atherosclerotic heart disease of native coronary artery without angina pectoris: Secondary | ICD-10-CM | POA: Diagnosis not present

## 2017-12-20 DIAGNOSIS — Z8249 Family history of ischemic heart disease and other diseases of the circulatory system: Secondary | ICD-10-CM

## 2017-12-20 DIAGNOSIS — R55 Syncope and collapse: Principal | ICD-10-CM | POA: Insufficient documentation

## 2017-12-20 DIAGNOSIS — S0003XA Contusion of scalp, initial encounter: Secondary | ICD-10-CM

## 2017-12-20 DIAGNOSIS — W1839XA Other fall on same level, initial encounter: Secondary | ICD-10-CM

## 2017-12-20 DIAGNOSIS — G8929 Other chronic pain: Secondary | ICD-10-CM

## 2017-12-20 DIAGNOSIS — I998 Other disorder of circulatory system: Secondary | ICD-10-CM

## 2017-12-20 HISTORY — DX: Pure hypercholesterolemia, unspecified: E78.00

## 2017-12-20 HISTORY — DX: Cardiac arrest, cause unspecified: I46.9

## 2017-12-20 HISTORY — DX: Unspecified osteoarthritis, unspecified site: M19.90

## 2017-12-20 HISTORY — DX: Essential (primary) hypertension: I10

## 2017-12-20 HISTORY — DX: Alcohol abuse, in remission: F10.11

## 2017-12-20 HISTORY — DX: Unspecified convulsions: R56.9

## 2017-12-20 HISTORY — DX: Gastro-esophageal reflux disease without esophagitis: K21.9

## 2017-12-20 HISTORY — DX: Syncope and collapse: R55

## 2017-12-20 HISTORY — DX: Atherosclerotic heart disease of native coronary artery without angina pectoris: I25.10

## 2017-12-20 LAB — I-STAT CG4 LACTIC ACID, ED: LACTIC ACID, VENOUS: 1.51 mmol/L (ref 0.5–1.9)

## 2017-12-20 LAB — COMPREHENSIVE METABOLIC PANEL
ALT: 18 U/L (ref 17–63)
ANION GAP: 8 (ref 5–15)
AST: 25 U/L (ref 15–41)
Albumin: 2.9 g/dL — ABNORMAL LOW (ref 3.5–5.0)
Alkaline Phosphatase: 89 U/L (ref 38–126)
BUN: 24 mg/dL — ABNORMAL HIGH (ref 6–20)
CHLORIDE: 101 mmol/L (ref 101–111)
CO2: 20 mmol/L — AB (ref 22–32)
Calcium: 8.1 mg/dL — ABNORMAL LOW (ref 8.9–10.3)
Creatinine, Ser: 2.01 mg/dL — ABNORMAL HIGH (ref 0.61–1.24)
GFR calc non Af Amer: 36 mL/min — ABNORMAL LOW (ref >60)
GFR, EST AFRICAN AMERICAN: 42 mL/min — AB (ref >60)
Glucose, Bld: 94 mg/dL (ref 65–99)
Potassium: 5.2 mmol/L — ABNORMAL HIGH (ref 3.5–5.1)
SODIUM: 129 mmol/L — AB (ref 135–145)
Total Bilirubin: 1.1 mg/dL (ref 0.3–1.2)
Total Protein: 5.8 g/dL — ABNORMAL LOW (ref 6.5–8.1)

## 2017-12-20 LAB — URINALYSIS, ROUTINE W REFLEX MICROSCOPIC
Bilirubin Urine: NEGATIVE
Glucose, UA: NEGATIVE mg/dL
Hgb urine dipstick: NEGATIVE
KETONES UR: NEGATIVE mg/dL
LEUKOCYTES UA: NEGATIVE
NITRITE: NEGATIVE
PH: 6 (ref 5.0–8.0)
PROTEIN: NEGATIVE mg/dL
Specific Gravity, Urine: 1.003 — ABNORMAL LOW (ref 1.005–1.030)

## 2017-12-20 LAB — CBC
HEMATOCRIT: 34.4 % — AB (ref 39.0–52.0)
Hemoglobin: 11.4 g/dL — ABNORMAL LOW (ref 13.0–17.0)
MCH: 30.5 pg (ref 26.0–34.0)
MCHC: 33.1 g/dL (ref 30.0–36.0)
MCV: 92 fL (ref 78.0–100.0)
Platelets: 503 10*3/uL — ABNORMAL HIGH (ref 150–400)
RBC: 3.74 MIL/uL — ABNORMAL LOW (ref 4.22–5.81)
RDW: 13.5 % (ref 11.5–15.5)
WBC: 18.1 10*3/uL — AB (ref 4.0–10.5)

## 2017-12-20 LAB — MAGNESIUM: Magnesium: 2.2 mg/dL (ref 1.7–2.4)

## 2017-12-20 LAB — DIGOXIN LEVEL: DIGOXIN LVL: 1.2 ng/mL (ref 0.8–2.0)

## 2017-12-20 LAB — TROPONIN I: Troponin I: 0.29 ng/mL (ref <0.03)

## 2017-12-20 LAB — OSMOLALITY: OSMOLALITY: 285 mosm/kg (ref 275–295)

## 2017-12-20 LAB — CBG MONITORING, ED: Glucose-Capillary: 86 mg/dL (ref 65–99)

## 2017-12-20 LAB — POTASSIUM: Potassium: 4.6 mmol/L (ref 3.5–5.1)

## 2017-12-20 MED ORDER — ENSURE ENLIVE PO LIQD
237.0000 mL | Freq: Two times a day (BID) | ORAL | Status: DC
  Administered 2017-12-21: 237 mL via ORAL

## 2017-12-20 MED ORDER — ASPIRIN EC 81 MG PO TBEC
81.0000 mg | DELAYED_RELEASE_TABLET | Freq: Every day | ORAL | Status: DC
  Administered 2017-12-21: 81 mg via ORAL
  Filled 2017-12-20 (×2): qty 1

## 2017-12-20 MED ORDER — AMIODARONE HCL 200 MG PO TABS
200.0000 mg | ORAL_TABLET | Freq: Every day | ORAL | Status: DC
  Administered 2017-12-21: 200 mg via ORAL
  Filled 2017-12-20: qty 1

## 2017-12-20 MED ORDER — SODIUM CHLORIDE 0.9 % IV BOLUS (SEPSIS)
250.0000 mL | Freq: Once | INTRAVENOUS | Status: AC
  Administered 2017-12-20: 250 mL via INTRAVENOUS

## 2017-12-20 MED ORDER — ENOXAPARIN SODIUM 40 MG/0.4ML ~~LOC~~ SOLN
40.0000 mg | SUBCUTANEOUS | Status: DC
  Administered 2017-12-20: 40 mg via SUBCUTANEOUS
  Filled 2017-12-20: qty 0.4

## 2017-12-20 MED ORDER — SODIUM CHLORIDE 0.9 % IV BOLUS (SEPSIS)
500.0000 mL | Freq: Once | INTRAVENOUS | Status: AC
  Administered 2017-12-20: 500 mL via INTRAVENOUS

## 2017-12-20 MED ORDER — ATORVASTATIN CALCIUM 80 MG PO TABS
80.0000 mg | ORAL_TABLET | Freq: Every day | ORAL | Status: DC
  Filled 2017-12-20: qty 1

## 2017-12-20 NOTE — ED Provider Notes (Signed)
[facility] EMERGENCY DEPARTMENT Provider Note   CSN: [id number] Arrival date & time: 12/20/17  1240     History   Chief Complaint Chief Complaint  Patient presents with  . Loss of Consciousness    HPI [name] is a 56 y.o. male.  HPI Patient recently discharged after cardiac arrest and CABG x4.  States for the last few days he has had lightheadedness especially when changing positions.  Yesterday he got up shortly after taking his medication and had brief syncopal episode falling forward and striking his head on the left.  This morning had near syncopal episode after standing up    History reviewed. No pertinent past medical history.  Patient Active Problem List   Diagnosis Date Noted  . 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 Coronary artery disease 12/07/2017  . Cardiac arrest (HCC) 11/29/2017  . Aspiration pneumonia [facility](HCC)     Past Surgical History:  Procedure Laterality Date  . CORONARY ARTERY BYPASS GRAFT N/A 12/07/2017   Procedure: CORONARY ARTERY BYPASS GRAFTING (CABG) times four using left internal mammary artery and right saphenous vein using endoscope for harvest.;  Surgeon: Kerin PernaVan Trigt, Peter, MD;  [facility];  Service: Open Heart Surgery;  Laterality: N/A;  . IABP INSERTION N/A 11/29/2017   Procedure: IABP Insertion;  Surgeon: [name], [name], MD;  Location: [facility] INVASIVE CV LAB;  Service: Cardiovascular;  Laterality: N/A;  . IABP INSERTION N/A 12/03/2017   Procedure: IABP INSERTION;  Surgeon: Dolores PattyBensimhon, [name], MD;  Location: [facility] INVASIVE CV LAB;  Service: Cardiovascular;  Laterality: N/A;  . LEFT HEART CATH AND CORONARY ANGIOGRAPHY N/A 11/29/2017   Procedure: LEFT HEART CATH AND CORONARY ANGIOGRAPHY;  Surgeon: [name], [name], MD;  Location: [facility] INVASIVE CV LAB;  Service: Cardiovascular;  Laterality: N/A;  . RIGHT HEART CATH N/A 11/29/2017   Procedure: RIGHT HEART CATH;  Surgeon: Dolores PattyBensimhon, [name], MD;  Location: [facility] INVASIVE CV LAB;   Service: Cardiovascular;  Laterality: N/A;  . TEE WITHOUT CARDIOVERSION N/A 12/07/2017   Procedure: TRANSESOPHAGEAL ECHOCARDIOGRAM (TEE);  Surgeon: [name], [name], MD;  [facility];  Service: Open Heart Surgery;  Laterality: N/A;       Home Medications    Prior to Admission medications   Medication Sig Start Date End Date Taking? Authorizing Provider  amiodarone (PACERONE) 200 MG tablet Take 1 tablet (200 mg total) by mouth 2 (two) times daily. Decrease to 200 mg daily starting 12/17/2017 Patient taking differently: Take 200 mg by mouth daily. Decrease to 200 mg daily starting 12/17/2017 12/13/17  Yes Barrett, Erin R, PA-C  ASPIRIN ADULT LOW STRENGTH 81 MG EC tablet Take 81 mg by mouth daily. 12/13/17  Yes [provider]  atorvastatin (LIPITOR) 80 MG tablet Take 1 tablet (80 mg total) by mouth daily at 6 PM. 12/13/17  Yes Barrett, Erin R, PA-C  carvedilol (COREG) 3.125 MG tablet Take 1 tablet (3.125 mg total) by mouth 2 (two) times daily with a meal. 12/13/17  Yes Barrett, Erin R, PA-C  digoxin (LANOXIN) 0.125 MG tablet Take 1 tablet (0.125 mg total) by mouth daily. 12/14/17  Yes Barrett, Erin R, PA-C  sacubitril-valsartan (ENTRESTO) 24-26 MG Take 1 tablet by mouth 2 (two) times daily. 12/13/17  Yes Barrett, Rae RoamErin R, PA-C  spironolactone (ALDACTONE) 25 MG tablet Take 1 tablet (25 mg total) by mouth daily. 12/14/17  Yes Barrett, Rae RoamErin R, PA-C  aspirin EC 325 MG EC tablet Take 1 tablet (325 mg total) by mouth daily.  Patient not taking: Reported on 12/20/2017 12/14/17   Barrett, Rae Roam, PA-C  oxyCODONE (OXY IR/ROXICODONE) 5 MG immediate release tablet Take 1-2 tablets (5-10 mg total) by mouth every 3 (three) hours as needed for severe pain. Patient not taking: Reported on 12/20/2017 12/13/17   Barrett, Rae Roam, PA-C    Family History History reviewed. No pertinent family history.  Social History Social History   Tobacco Use  . Smoking status: Never Smoker  . Smokeless  tobacco: Never Used  Substance Use Topics  . Alcohol use: Not on file  . Drug use: No     Allergies   Patient has no known allergies.   Review of Systems Review of Systems  Constitutional: Positive for fatigue. Negative for chills and fever.  Eyes: Negative for visual disturbance.  Respiratory: Negative for cough and shortness of breath.   Cardiovascular: Positive for chest pain. Negative for palpitations and leg swelling.  Gastrointestinal: Negative for abdominal pain, diarrhea, nausea and vomiting.  Musculoskeletal: Negative for back pain, myalgias, neck pain and neck stiffness.  Skin: Negative for rash and wound.  Neurological: Positive for dizziness, syncope, light-headedness and headaches. Negative for weakness and numbness.  All other systems reviewed and are negative.    Physical Exam Updated Vital Signs BP 102/66   Pulse 84   Temp 97.6 F (36.4 C) (Oral)   Resp 19   Ht 5\' 10"  (1.778 m)   Wt 72.6 kg (160 lb)   SpO2 100%   BMI 22.96 kg/m   Physical Exam  Constitutional: He is oriented to person, place, and time. He appears well-developed and well-nourished.  HENT:  Head: Normocephalic.  Mouth/Throat: Oropharynx is clear and moist.  Patient with small hematoma to the left parietal region of the scalp.  No underlying bony deformity.  Eyes: EOM are normal. Pupils are equal, round, and reactive to light.  Neck: Normal range of motion. Neck supple.  No posterior midline cervical tenderness to palpation.  Cardiovascular: Normal rate and regular rhythm. Exam reveals no gallop and no friction rub.  No murmur heard. Pulmonary/Chest: Effort normal.  Diminished breath sounds left base  Abdominal: Soft. Bowel sounds are normal. There is no tenderness. There is no rebound and no guarding.  Musculoskeletal: Normal range of motion. He exhibits no edema or tenderness.  Neurological: He is alert and oriented to person, place, and time.  Moves all extremities without focal  deficit.  Sensation intact.  Skin: Skin is warm and dry. Capillary refill takes less than 2 seconds. No rash noted. No erythema.  Psychiatric: He has a normal mood and affect. His behavior is normal.  Nursing note and vitals reviewed.    ED Treatments / Results  Labs (all labs ordered are listed, but only abnormal results are displayed) Labs Reviewed  CBC - Abnormal; Notable for the following components:      Result Value   WBC 18.1 (*)    RBC 3.74 (*)    Hemoglobin 11.4 (*)    HCT 34.4 (*)    Platelets 503 (*)    All other components within normal limits  URINALYSIS, ROUTINE W REFLEX MICROSCOPIC - Abnormal; Notable for the following components:   Specific Gravity, Urine 1.003 (*)    All other components within normal limits  COMPREHENSIVE METABOLIC PANEL - Abnormal; Notable for the following components:   Sodium 129 (*)    Potassium 5.2 (*)    CO2 20 (*)    BUN 24 (*)    Creatinine, Ser  2.01 (*)    Calcium 8.1 (*)    Total Protein 5.8 (*)    Albumin 2.9 (*)    GFR calc non Af Amer 36 (*)    GFR calc Af Amer 42 (*)    All other components within normal limits  TROPONIN I - Abnormal; Notable for the following components:   Troponin I 0.29 (*)    All other components within normal limits  MAGNESIUM  DIGOXIN LEVEL  CBG MONITORING, ED  I-STAT CG4 LACTIC ACID, ED    EKG  EKG Interpretation None       Radiology Ct Head Wo Contrast  Result Date: 12/20/2017 CLINICAL DATA:  Syncope EXAM: CT HEAD WITHOUT CONTRAST TECHNIQUE: Contiguous axial images were obtained from the base of the skull through the vertex without intravenous contrast. COMPARISON:  11/29/2017 FINDINGS: Brain: No acute territorial infarction, hemorrhage or intracranial mass is visualized. 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 Small old appearing infarct in the left cerebellum. Mild small vessel ischemic changes of the white matter. Mild to moderate atrophy. Stable ventricle size. Vascular: No  hyperdense vessels.  Carotid artery calcification Skull: No fracture Sinuses/Orbits: Mild mucosal thickening in the ethmoid sinuses. No acute orbital abnormality. Other: None IMPRESSION: 1. No CT evidence for acute intracranial abnormality. 2. Atrophy and mild small vessel ischemic changes of the white matter. Electronically Signed   By: Jasmine PangKim  Fujinaga M.D.   On: 12/20/2017 14:02   Dg Chest Port 1 View  Result Date: 12/20/2017 CLINICAL DATA:  56 year old male status post CABG on 12/07/2017. Syncopal episode, falls. Hypotensive. EXAM: PORTABLE CHEST 1 VIEW COMPARISON:  12/12/2017 and earlier. FINDINGS: Portable AP upright view at 1300 hours. Progressed small to moderate left pleural effusion with associated elevation of the left hemidiaphragm. No pneumothorax or pulmonary edema. Trace if any residual right pleural effusion prior CABG. Negative visible bowel gas pattern. Stable cardiac size and mediastinal contours. Nonacute anterior right rib fractures. IMPRESSION: 1. Unresolved and mildly progressed left pleural effusion since 12/12/2017. Superimposed elevated left hemidiaphragm. 2. No other acute cardiopulmonary abnormality identified. Electronically Signed   By: Odessa FlemingH  Hall M.D.   On: 12/20/2017 13:23    Procedures Procedures (including critical care time)  Medications Ordered in ED Medications  sodium chloride 0.9 % bolus 500 mL (500 mLs Intravenous New Bag/Given 12/20/17 1545)  sodium chloride 0.9 % bolus 250 mL (0 mLs Intravenous Stopped 12/20/17 1309)  sodium chloride 0.9 % bolus 250 mL (0 mLs Intravenous Stopped 12/20/17 1435)     Initial Impression / Assessment and Plan / ED Course  I have reviewed the triage vital signs and the nursing notes.  Pertinent labs & imaging results that were available during my care of the patient were reviewed by me and considered in my medical decision making (see chart for details).    Patient remains orthostatic after initial fluid bolus.  Seen by Dr.  Sampson GoonBen Simon.  Patient does have acute kidney injury likely due to dehydration.  Will admit to internal medicine for gradual rehydration and monitoring. Final Clinical Impressions(s) / ED Diagnoses   Final diagnoses:  Orthostasis  AKI (acute kidney injury) [facility](HCC)    ED Discharge Orders    None       Loren RacerYelverton, Nysia Dell, MD 12/20/17 [phone]

## 2017-12-20 NOTE — ED Notes (Signed)
Patient transported to CT 

## 2017-12-20 NOTE — ED Triage Notes (Signed)
Per EMS, patient is from home complaining of syncopal episodes x 3 days with falls.  Knot noted on left side of head.  Hypotensive.  Hx of cardiac arrest/post CPR 11/29/2017.  Bypass with stents placed.  NKDA.  80/60 initial pressure.  After 500ml of NS, 102/60.

## 2017-12-20 NOTE — Telephone Encounter (Signed)
Patient was referred to and enrolled into the HF Community Paramedicine Program during his previous hospitalization.  He had a home visit today prior to coming to ED.  His [place] and this makes him ineligible for the [facility] going forward.

## 2017-12-20 NOTE — Progress Notes (Signed)
Paramedicine Encounter    Patient ID: [name], male    DOB: 09/18/1961, 56 y.o.   MRN: [id number]   No care team member to display  Patient Active Problem List   Diagnosis Date Noted  . 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 Coronary artery disease 12/07/2017  . Cardiac arrest (HCC) 11/29/2017  . Aspiration pneumonia (HCC)     Current Outpatient Medications:  .  amiodarone (PACERONE) 200 MG tablet, Take 1 tablet (200 mg total) by mouth 2 (two) times daily. Decrease to 200 mg daily starting 12/17/2017, Disp: 60 tablet, Rfl: 1 .  aspirin EC 325 MG EC tablet, Take 1 tablet (325 mg total) by mouth daily., Disp: 30 tablet, Rfl: 0 .  atorvastatin (LIPITOR) 80 MG tablet, Take 1 tablet (80 mg total) by mouth daily at 6 PM., Disp: 30 tablet, Rfl: 3 .  carvedilol (COREG) 3.125 MG tablet, Take 1 tablet (3.125 mg total) by mouth 2 (two) times daily with a meal., Disp: 60 tablet, Rfl: 3 .  digoxin (LANOXIN) 0.125 MG tablet, Take 1 tablet (0.125 mg total) by mouth daily., Disp: 30 tablet, Rfl: 3 .  levofloxacin (LEVAQUIN) 750 MG tablet, Take 1 tablet (750 mg total) by mouth daily., Disp: 2 tablet, Rfl: 0 .  sacubitril-valsartan (ENTRESTO) 24-26 MG, Take 1 tablet by mouth 2 (two) times daily., Disp: 60 tablet, Rfl: 3 .  spironolactone (ALDACTONE) 25 MG tablet, Take 1 tablet (25 mg total) by mouth daily., Disp: 30 tablet, Rfl: 3 .  oxyCODONE (OXY IR/ROXICODONE) 5 MG immediate release tablet, Take 1-2 tablets (5-10 mg total) by mouth every 3 (three) hours as needed for severe pain. (Patient not taking: Reported on 12/20/2017), Disp: 30 tablet, Rfl: 0 No Known Allergies    Social History   Socioeconomic History  . Marital status: Not on file    Spouse name: Not on file  . Number of children: Not on file  . Years of education: Not on file  . Highest education level: Not on file  Social Needs  . Financial resource strain: Not on file  . Food insecurity - worry: Not on file  . Food insecurity -  inability: Not on file  . Transportation needs - medical: Not on file  . Transportation needs - non-medical: Not on file  Occupational History  . Not on file  Tobacco Use  . Smoking status: Never Smoker  . Smokeless tobacco: Never Used  Substance and Sexual Activity  . Alcohol use: Not on file  . Drug use: No  . Sexual activity: Not Currently  Other Topics Concern  . Not on file  Social History Narrative  . Not on file    Physical Exam      Future Appointments  Date Time Provider Department Center  01/01/2018  2:30 PM [facility] PA/NP [facility] None  01/09/2018 10:30 AM Storm FriskWright, Patrick E, MD [facility] None  01/16/2018  1:00 PM [name], [name], MD [facility]    BP (!) 60/40 (BP Location: Right Arm, Patient Position: Sitting, Cuff Size: Normal)   Pulse 78   Resp 18   [name] was seen at home for his initial visit today. While nearing his address I noted that [place] but due to his history of cardiac arrest earlier this month, I decided to complete this visit and then advise the clinic the [facility] could not be [facility]. When I arrived at the door, [name] came to  the door and looked through his window but seemed to be having trouble focusing. He suddenly collapsed before opening the door. I made entry into the home and he was alert and oriented but said he was very dizzy and had been for several days. He reported that he had taken several falls ober the past 3 or 4 days. He also stated he had a bloody bowel movement last night that turned the toilet bowl water a bright red. His blood pressure was palpated at the brachial artery and found to be 60 mm/Hg. He said he has been walking everyday before he takes his medications because after he takes them he becomes dizzy and light sensitive. I contacted the clinic with the aforementioned findings and the requested he be seen in the ED. [facility] and [facility].   While waiting for the ambulance to arrive he told me that is is unable to pay his rent and power bill.   Jacqualine CodeZackery R Jackye Dever, EMT 12/20/17  ACTION: Home visit completed

## 2017-12-20 NOTE — ED Notes (Signed)
CBG taken at 13:11 was 86.

## 2017-12-20 NOTE — Consult Note (Signed)
Advanced Heart Failure Team Consult Note   Primary Cardiologist:  [name]  Reason for Consultation:  Syncope, orthostasis  HPI:    [name] is seen today for evaluation of syncope, orthostasis and AKI at the request of Dr. Ranae PalmsYelverton  [name] is a 56 yo white male with h/o ETOH abuse, VF arrest, CAD s/p CABG 12/07/17 and ischemic CM with EF 25-30%  [facility] 11/[facility]/2017 after experiencing a VF arrest while chopping wood.  It was witnessed by his neighbor who started CPR.  EMS was contacted and brought patient to the ED.  He required intubation in the field. CT scan of the chest showed collapse of the RUL.  CCM performed bronchoscopy.  Cardiology felt the patient would require emergent cardiac catheterization whch showed severe 3V CAD with placement of IABP.  Echocardiogram was obtained and showed a low EF of 25-30 %. After medical stabilization, he underwent CABG x 4 utilizing LIMA to LAD, SVG to OM1, SVG to OM2, and SVG to RCA on 12/07/17. He required slow milrinone wean post-op due to low EF. He was discharged on 12/14/17 on spiro 25 daily, digoxin 0.125, carvedilol 3.125 and Entresto 24/26 bid.  He had been doing well until last few days when he began to feel more weak and became dizzy when standing. Yesterday he got up shortly after taking his medication and had brief syncopal episode falling forward and striking his head on the left.  This morning had near syncopal episode after standing up. He denies CP, SOB, orthopnea, PND or focal neuro symptoms.   In ER found to be orthostatic with AKI. Head CT negative for bleed.   Review of Systems: [y] = yes, [ ]  = no   General: Weight gain [ ] ; Weight loss [ ] ; Anorexia [ ] ; Fatigue [ ] ; Fever [ ] ; Chills [ ] ; Weakness [ y]  Cardiac: Chest pain/pressure [ ] ; Resting SOB [ ] ; Exertional SOB [ ] ; Orthopnea [ ] ; Pedal Edema [ ] ; Palpitations [ ] ; Syncope Cove.Etienne[y ]; Presyncope Cove.Etienne[y ]; Paroxysmal nocturnal dyspnea[ ]     Pulmonary: Cough [ ] ; Wheezing[ ] ; Hemoptysis[ ] ; Sputum [ ] ; Snoring [ ]   GI: Vomiting[ ] ; Dysphagia[ ] ; Melena[ ] ; Hematochezia [ ] ; Heartburn[ ] ; Abdominal pain [ ] ; Constipation [ ] ; Diarrhea [ ] ; BRBPR [ ]   GU: Hematuria[ ] ; Dysuria [ ] ; Nocturia[ ]   Vascular: Pain in legs with walking [ ] ; Pain in feet with lying flat [ ] ; Non-healing sores [ ] ; Stroke [ ] ; TIA [ ] ; Slurred speech [ ] ;  Neuro: Headaches[ ] ; Vertigo[ ] ; Seizures[ ] ; Paresthesias[ ] ;Blurred vision [ ] ; Diplopia [ ] ; Vision changes [ ]   Ortho/Skin: Arthritis [ y]; Joint pain [ y]; Muscle pain [ ] ; Joint swelling [ ] ; Back Pain [ ] ; Rash [ ]   Psych: Depression[ ] ; Anxiety[ ]   Heme: Bleeding problems [ ] ; Clotting disorders [ ] ; Anemia [ ]   Endocrine: Diabetes [ ] ; Thyroid dysfunction[ ]   Home Medications Prior to Admission medications   Medication Sig Start Date End Date Taking? Authorizing Provider  amiodarone (PACERONE) 200 MG tablet Take 1 tablet (200 mg total) by mouth 2 (two) times daily. Decrease to 200 mg daily starting 12/17/2017 Patient taking differently: Take 200 mg by mouth daily. Decrease to 200 mg daily starting 12/17/2017 12/13/17  Yes Barrett, Erin R, PA-C  ASPIRIN ADULT LOW STRENGTH 81 MG EC tablet Take 81 mg by mouth daily. 12/13/17  Yes  [provider]  atorvastatin (LIPITOR) 80 MG tablet Take 1 tablet (80 mg total) by mouth daily at 6 PM. 12/13/17  Yes Barrett, Erin R, PA-C  carvedilol (COREG) 3.125 MG tablet Take 1 tablet (3.125 mg total) by mouth 2 (two) times daily with a meal. 12/13/17  Yes Barrett, Erin R, PA-C  digoxin (LANOXIN) 0.125 MG tablet Take 1 tablet (0.125 mg total) by mouth daily. 12/14/17  Yes Barrett, Erin R, PA-C  sacubitril-valsartan (ENTRESTO) 24-26 MG Take 1 tablet by mouth 2 (two) times daily. 12/13/17  Yes Barrett, Rae Roam, PA-C  spironolactone (ALDACTONE) 25 MG tablet Take 1 tablet (25 mg total) by mouth daily. 12/14/17  Yes Barrett, Rae Roam, PA-C  aspirin EC 325 MG EC  tablet Take 1 tablet (325 mg total) by mouth daily. Patient not taking: Reported on 12/20/2017 12/14/17   Barrett, Rae Roam, PA-C  oxyCODONE (OXY IR/ROXICODONE) 5 MG immediate release tablet Take 1-2 tablets (5-10 mg total) by mouth every 3 (three) hours as needed for severe pain. Patient not taking: Reported on 12/20/2017 12/13/17   Barrett, Rae Roam, PA-C    Past Medical History: History reviewed. No pertinent past medical history.  Past Surgical History: Past Surgical History:  Procedure Laterality Date  . CORONARY ARTERY BYPASS GRAFT N/A 12/07/2017   Procedure: CORONARY ARTERY BYPASS GRAFTING (CABG) times four using left internal mammary artery and right saphenous vein using endoscope for harvest.;  Surgeon: [name], MD;  Location: [facility] OR;  Service: Open Heart Surgery;  Laterality: N/A;  . IABP INSERTION N/A 11/29/2017   Procedure: IABP Insertion;  Surgeon: [name], MD;  Location: [facility] INV[facility] CV LAB;  Service: Cardiovascular;  Laterality: N/A;  . IABP INSERTION N/A 12/03/2017   Procedure: IABP INSERTION;  Surgeon: [name], MD;  Location: [facility] IN[facility];  Service: Cardiovascular;  Laterality: N/A;  . LEFT HEART CATH AND CORONARY ANGIOGRAPHY N/A 11/29/2017   Procedure: LEFT HEART CATH AND CORONARY ANGIOGRAPHY;  Surgeon: [name], MD;  Location: [facility] INV[facility] CV LAB;  Service: Cardiovascular;  Laterality: N/A;  . RIGHT HEART CATH N/A 11/29/2017   Procedure: RIGHT HEART CATH;  Surgeon: [name], MD;  Location: [facility] IN[facility] CV LAB;  Service: Cardiovascular;  Laterality: N/A;  . TEE WITHOUT CARDIOVERSION N/A 12/07/2017   Procedure: TRANSESOPHAGEAL ECHOCARDIOGRAM (TEE);  Surgeon: Donata Clay, Theron Arista, MD;  Location: [facility] OR;  Service: Open Heart Surgery;  Laterality: N/A;    Family History: History reviewed. No pertinent family history.  Social History: Social History   Socioeconomic History  . Marital status: Single    Spouse name: None  . Number  of children: None  . Years of education: None  . Highest education level: None  Social Needs  . Financial resource strain: None  . Food insecurity - worry: None  . Food insecurity - inability: None  . Transportation needs - medical: None  . Transportation needs - non-medical: None  Occupational History  . None  Tobacco Use  . Smoking status: Never Smoker  . Smokeless tobacco: Never Used  Substance and Sexual Activity  . Alcohol use: None  . Drug use: No  . Sexual activity: Not Currently  Other Topics Concern  . None  Social History Narrative  . None    Allergies:  No Known Allergies  Objective:    Vital Signs:   Temp:  [97.6 F (36.4 C)] 97.6 F (36.4 C) (12/27 1245) Pulse Rate:  [75-117] 84 (12/27 1600) Resp:  [15-24]  19 (12/27 1600) BP: (60-102)/(40-72) 102/66 (12/27 1600) SpO2:  [73 %-100 %] 100 % (12/27 1600) Weight:  [65.8 kg (145 lb)-72.6 kg (160 lb)] 72.6 kg (160 lb) (12/27 1245)    Weight change: Filed Weights   12/20/17 1244 12/20/17 1245  Weight: 65.8 kg (145 lb) 72.6 kg (160 lb)    Intake/Output:   Intake/Output Summary (Last 24 hours) at 12/20/2017 1720 Last data filed at 12/20/2017 1309 Gross per 24 hour  Intake 250 ml  Output -  Net 250 ml      Physical Exam    General:  Well appearing. No resp difficulty HEENT: normal Neck: supple. JVP flat . Carotids 2+ bilat; no bruits. No lymphadenopathy or thyromegaly appreciated. Cor: PMI nondisplaced. Regular rate & rhythm. No rubs, gallops or murmurs. Lungs: clear decreased throughout Abdomen: soft, nontender, nondistended. No hepatosplenomegaly. No bruits or masses. Good bowel sounds. Extremities: no cyanosis, clubbing, rash, edema Neuro: alert & orientedx3, cranial nerves grossly intact. moves all 4 extremities w/o difficulty. Affect pleasant   Telemetry   NSR 70-80s Personally reviewed   EKG    NSR 78 diffuse non-specific ST-T wave abnormalities. Personally reviewed   Labs    Basic Metabolic Panel: Recent Labs  Lab 12/20/17 1256  NA 129*  K 5.2*  CL 101  CO2 20*  GLUCOSE 94  BUN 24*  CREATININE 2.01*  CALCIUM 8.1*  MG 2.2    Liver Function Tests: Recent Labs  Lab 12/20/17 1256  AST 25  ALT 18  ALKPHOS 89  BILITOT 1.1  PROT 5.8*  ALBUMIN 2.9*   No results for input(s): LIPASE, AMYLASE in the last 168 hours. No results for input(s): AMMONIA in the last 168 hours.  CBC: Recent Labs  Lab 12/20/17 1256  WBC 18.1*  HGB 11.4*  HCT 34.4*  MCV 92.0  PLT 503*    Cardiac Enzymes: Recent Labs  Lab 12/20/17 1256  TROPONINI 0.29*    BNP: BNP (last 3 results) No results for input(s): BNP in the last 8760 hours.  ProBNP (last 3 results) No results for input(s): PROBNP in the last 8760 hours.   CBG: Recent Labs  Lab 12/20/17 1310  GLUCAP 86    Coagulation Studies: No results for input(s): LABPROT, INR in the last 72 hours.   Imaging   Ct Head Wo Contrast  Result Date: 12/20/2017 CLINICAL DATA:  Syncope EXAM: CT HEAD WITHOUT CONTRAST TECHNIQUE: Contiguous axial images were obtained from the base of the skull through the vertex without intravenous contrast. COMPARISON:  11/29/2017 FINDINGS: Brain: No acute territorial infarction, hemorrhage or intracranial mass is visualized. 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 Small old appearing infarct in the left cerebellum. Mild small vessel ischemic changes of the white matter. Mild to moderate atrophy. Stable ventricle size. Vascular: No hyperdense vessels.  Carotid artery calcification Skull: No fracture Sinuses/Orbits: Mild mucosal thickening in the ethmoid sinuses. No acute orbital abnormality. Other: None IMPRESSION: 1. No CT evidence for acute intracranial abnormality. 2. Atrophy and mild small vessel ischemic changes of the white matter. Electronically Signed   By: Jasmine Pang M.D.   On: 12/20/2017 14:02   Dg Chest Port 1 View  Result Date: 12/20/2017 CLINICAL  DATA:  56 year old male status post CABG on 12/07/2017. Syncopal episode, falls. Hypotensive. EXAM: PORTABLE CHEST 1 VIEW COMPARISON:  12/12/2017 and earlier. FINDINGS: Portable AP upright view at 1300 hours. Progressed small to moderate left pleural effusion with associated elevation of the left hemidiaphragm. No pneumothorax or  pulmonary edema. Trace if any residual right pleural effusion prior CABG. Negative visible bowel gas pattern. Stable cardiac size and mediastinal contours. Nonacute anterior right rib fractures. IMPRESSION: 1. Unresolved and mildly progressed left pleural effusion since 12/12/2017. Superimposed elevated left hemidiaphragm. 2. No other acute cardiopulmonary abnormality identified. Electronically Signed   By: Odessa FlemingH  Hall M.D.   On: 12/20/2017 13:23       Patient Profile   [name] is a 56 yo white male with h/o ETOH abuse, VF arrest, CAD s/p CABG 12/07/17 and ischemic CM with EF 25-30% who presents with syncope, orthostasis and syncope  Assessment/Plan   1. Syncope or orthostatic hypotension - this is related to volume depletion from Entresto and spironolactone. Will hold these for now - would hydrate with 1L NS in ER. If improved can go home with close f/u in HF Clinic. If still symptomatic can admit to hospitalist service for overnight observation. D/w Dr. Ranae PalmsYelverton. - head CT (Personally reviewed) shows no evidence of bleed  2. AKI - as above related ATN from overdiuresis. Hold spiro and Entresto. Hydrate and follow.   3. CAD  - s/p recent CABG. - no s/s ischemia - continue statin, ASA. Hold b-blocker for now with low BP  4. Ischemic cardiomyopathy - EF 25-30% pre CABG - volume status low - NYHA I-II - hold all HF meds for now until BP improves.  - dig level is high. Will stop dig completely  5. Elevated left hemidiaphragm on CXR with small to moderate left pleural effusion - has f/u with TCTs   Arvilla Meresaniel [name], MD  5:34 PM    Length of Stay:  0  Arvilla Meresaniel [name], MD  12/20/2017, 5:20 PM  Advanced Heart Failure Team Pager [phone] (M-F; 7a - 4p)  Please contact [facility] for night-coverage after hours (4p -7a ) and weekends on [email or web address]

## 2017-12-20 NOTE — ED Notes (Signed)
Admitting team at bedside.

## 2017-12-20 NOTE — ED Notes (Addendum)
Pt states pain at ribs 6/10 with movment.

## 2017-12-20 NOTE — H&P (Signed)
Date: 12/20/2017               Patient Name:  [name] MRN: [id number]  DOB: 03/06/1961 Age / Sex: 56 y.o., male   PCP: Patient, No Pcp Per         Medical Service: Internal Medicine Teaching Service         Attending Physician: Dr. Mikey BussingHoffman, Marthenia RollingErik C, DO    First Contact: Dr. Alinda [name] Pager: [phone]  Second Contact: Dr. Obie [name] Pager: [phone]       After Hours (After 5p/  First Contact Pager: [phone]  weekends / holidays): Second Contact Pager: [phone]   Chief Complaint: Syncope  History of Present Illness: [name] is a 56 yo M with a history of CAD (s/p CABG x 4 on 12/07/17 following presentation with V.Fib arrest) who presents with Syncope. He states he has been feeling lightheaded upon standing for the past 3 days. He states that in that time he has become significantly lightheaded 3 time and has fallen twice. The first time was a day or two ago when he fell and hit his head. He states he did not loose consciousness nor notice any bleed. He states he was wearing a thick hat at the time, which helped to cushion the fall. He reports a small bump at the site of his head injury, but no open wound. The second time he fell was today when he was getting up to open the door for EMS who was coming by to check on him. He was then transported to the ED. He states he has been drink 6-7 bottles of water a day. He endorses some diarrhea and constipation since his recent admission and state that he feels as though light look extra bright after taking his morning medication. He denies chest (othe than chronic rib pain from broken ribs). He denies dyspnea, abdominal pain, or headaches.  In the ED, Vital signs significant for hypotension down to 80s/60s and RR into the low 20s. CBC showed WBC 18.1, Plt 503; CMP showed Na 129, K 5.2, Co2 20, BUN 24, Cr 2.01. Troponin 0.29. EKG showed sinus rhythm, low voltage (precordial leads), nonspecific T abnormalities. CXR showed left pleural effusion  persistent from previous effusion without acute abnormality. CT Head was without acute abnormality. He received 1L IVF in the ED. Patient to be admitted for further workup and care.  Meds:  Current Meds  Medication Sig  . amiodarone (PACERONE) 200 MG tablet Take 1 tablet (200 mg total) by mouth 2 (two) times daily. Decrease to 200 mg daily starting 12/17/2017 (Patient taking differently: Take 200 mg by mouth daily. Decrease to 200 mg daily starting 12/17/2017)  . ASPIRIN ADULT LOW STRENGTH 81 MG EC tablet Take 81 mg by mouth daily.  Marland Kitchen. atorvastatin (LIPITOR) 80 MG tablet Take 1 tablet (80 mg total) by mouth daily at 6 PM.  . carvedilol (COREG) 3.125 MG tablet Take 1 tablet (3.125 mg total) by mouth 2 (two) times daily with a meal.  . digoxin (LANOXIN) 0.125 MG tablet Take 1 tablet (0.125 mg total) by mouth daily.  . sacubitril-valsartan (ENTRESTO) 24-26 MG Take 1 tablet by mouth 2 (two) times daily.  Marland Kitchen. spironolactone (ALDACTONE) 25 MG tablet Take 1 tablet (25 mg total) by mouth daily.     Allergies: Allergies as of 12/20/2017  . (No Known Allergies)   History reviewed. No pertinent past medical history.  Family History:  Family History  Problem Relation Age of  Onset  . Heart attack Other   - Confirmed on admission  Social History:  Social History   Tobacco Use  . Smoking status: Never Smoker  . Smokeless tobacco: Never Used  Substance Use Topics  . Alcohol use: Not on file  . Drug use: No  - Confirmed on Admission  Review of Systems: A complete ROS was negative except as per HPI. Physical Exam: Blood pressure 120/80, pulse 81, temperature 97.8 F (36.6 [name]), temperature source Oral, resp. rate 18, height 5\' 10"  (1.778 m), weight 144 lb 3.2 oz (65.4 kg), SpO2 98 %.  Physical Exam  Constitutional: He appears well-developed and well-nourished.  HENT:  Small hematoma at Left Parietal scalp  Eyes: EOM are normal. Right eye exhibits no discharge. Left eye exhibits no discharge.    Cardiovascular: Normal rate, regular rhythm, normal heart sounds and intact distal pulses.  Pulmonary/Chest: Effort normal. No respiratory distress.  Basilar Crackles  Abdominal: Soft. Bowel sounds are normal. He exhibits no distension. There is no tenderness.  Musculoskeletal: He exhibits edema. He exhibits no deformity.  Trace-1+ pitting edema RLE No Edema LLE  Neurological: He is alert.  Skin: Skin is warm and dry.    EKG:  Sinus rhythm Borderline short PR interval Low voltage, precordial leads Nonspecific T abnormalities, lateral leads  CXR: personally reviewed and I agree with: IMPRESSION: 1. Unresolved and mildly progressed left pleural effusion since 12/12/2017. Superimposed elevated left hemidiaphragm. 2. No other acute cardiopulmonary abnormality identified.  CT Head WO Contrast: IMPRESSION: 1. No CT evidence for acute intracranial abnormality. 2. Atrophy and mild small vessel ischemic changes of the white matter.  Assessment & Plan by Problem:  [name] is a 56 yo M with a history of CAD (s/p CABG x 4 on 12/07/17 following presentation with V.Fib arrest) who presents with Syncope.  Syncope: 3 days of lightheadedness upon standing with 2 falls (one resulting in head injury). CT head without acute abnormality. Orthostatic vitals positive. Patient's syncope most likely related to volume depletion from entresto and spironolactone per heart failure team. Received 1L IVF in ED. - Appreciate Heart Failure Team Recommendations - Hold entresto and spironolactone - Repeat orthostatic vitals - AM CBC and BMP  AKI: BUN 24, Cr 2.01 on admission. Likely 2/2 volume depletion from entresto and spironolactone as above. Received 1.5L IVF in ED. Hyponatremia and mild hyperkalemia noted on initial BMP. - AM BMP  CAD: S/P CABG x4 earlier this month, following V. Fib arrest. Troponin 0.29 on admission; In the setting of hypotension in a patient with ischemic cardiomyopathy and recent  CABG x4; Likely Type II NSTEMI. - Appreciate Heart Failure Team Recommendations - Hold home Coreg in the setting of orthostatic hypotension - Continue home atorvastatin - Continue home ASA  Ischemic Cardiomyopathy: EF 25-30% pre-CABG (11/30/17) and 40-45% post CABG (12/04/17).   - Appreciate Heart Failure Team Recommendations - Holding home Entresto, Spironolactone, and Coreg - Discontinue Digoxin  FEN: Heart Diet DVT Prophylaxis: Lovenox Code Status: Full  Dispo: Admit patient to Observation with expected length of stay less than 2 midnights.  Signed: Beola CordMelvin, Wally Behan, MD 12/20/2017, 8:36 PM  Pager: [phone]

## 2017-12-20 NOTE — ED Notes (Signed)
Dr. Tommye StandardBensamione at bedside

## 2017-12-20 NOTE — ED Notes (Signed)
Pt denied any dizziness or lightheadedness during orthostatic VS taken at 14:38. Stated he felt much better than this morning.

## 2017-12-20 NOTE — ED Notes (Signed)
Pt aware that urine specimen needed.  Unable to urinate at this time.  

## 2017-12-20 NOTE — ED Notes (Signed)
EDP made aware of critical troponin of 0.29

## 2017-12-21 ENCOUNTER — Observation Stay (HOSPITAL_COMMUNITY): Payer: Medicaid Other

## 2017-12-21 DIAGNOSIS — I255 Ischemic cardiomyopathy: Secondary | ICD-10-CM

## 2017-12-21 DIAGNOSIS — I2510 Atherosclerotic heart disease of native coronary artery without angina pectoris: Secondary | ICD-10-CM

## 2017-12-21 DIAGNOSIS — I951 Orthostatic hypotension: Secondary | ICD-10-CM

## 2017-12-21 DIAGNOSIS — M546 Pain in thoracic spine: Secondary | ICD-10-CM

## 2017-12-21 LAB — CBC
HEMATOCRIT: 35.9 % — AB (ref 39.0–52.0)
HEMOGLOBIN: 11.5 g/dL — AB (ref 13.0–17.0)
MCH: 30 pg (ref 26.0–34.0)
MCHC: 32 g/dL (ref 30.0–36.0)
MCV: 93.7 fL (ref 78.0–100.0)
Platelets: 404 10*3/uL — ABNORMAL HIGH (ref 150–400)
RBC: 3.83 MIL/uL — ABNORMAL LOW (ref 4.22–5.81)
RDW: 13.8 % (ref 11.5–15.5)
WBC: 7.8 10*3/uL (ref 4.0–10.5)

## 2017-12-21 LAB — SODIUM, URINE, RANDOM: Sodium, Ur: 63 mmol/L

## 2017-12-21 LAB — BASIC METABOLIC PANEL
ANION GAP: 8 (ref 5–15)
BUN: 15 mg/dL (ref 6–20)
CO2: 24 mmol/L (ref 22–32)
Calcium: 8.4 mg/dL — ABNORMAL LOW (ref 8.9–10.3)
Chloride: 103 mmol/L (ref 101–111)
Creatinine, Ser: 1.23 mg/dL (ref 0.61–1.24)
GFR calc Af Amer: >60 mL/min (ref >60)
GLUCOSE: 92 mg/dL (ref 65–99)
POTASSIUM: 4.9 mmol/L (ref 3.5–5.1)
Sodium: 135 mmol/L (ref 135–145)

## 2017-12-21 LAB — OSMOLALITY, URINE: OSMOLALITY UR: 296 mosm/kg — AB (ref 300–900)

## 2017-12-21 MED ORDER — LOSARTAN POTASSIUM 25 MG PO TABS: 12.5000 mg | ORAL_TABLET | Freq: Every day | ORAL | 0 refills | Status: DC

## 2017-12-21 MED ORDER — ACETAMINOPHEN 325 MG PO TABS: 650.0000 mg | ORAL_TABLET | Freq: Four times a day (QID) | ORAL | Status: DC | PRN

## 2017-12-21 MED ORDER — AMIODARONE HCL 200 MG PO TABS: 200.0000 mg | ORAL_TABLET | Freq: Every day | ORAL | 0 refills | Status: DC

## 2017-12-21 MED ORDER — SPIRONOLACTONE 25 MG PO TABS: 12.5000 mg | ORAL_TABLET | Freq: Every day | ORAL | 0 refills | Status: DC

## 2017-12-21 MED FILL — LOSARTAN POTASSIUM 25 MG TA: 25 | 34 days supply | Qty: 17 | Fill #0

## 2017-12-21 NOTE — Progress Notes (Signed)
Patient ready for discharge from unit to home.Cab voucher provided. All discharge instructions and rx reviewed with patient. Personal belongings with patient. Patient ofers no c/o discomfort, no distress noted. Stressed importance of keeping follow up appts with all physicians involved in plan of care.

## 2017-12-21 NOTE — Progress Notes (Addendum)
Advanced Heart Failure Rounding Note  Primary Cardiologist: Dr. Gala Romney   Subjective:    Feeling better this am. States he was drinking a lot of fluid at home. Denies lightheadedness or dizziness.   Creatinine 2.0 -> 1.23. K 4.9.   I/O negative, but received > 1 L of NS and not recorded accurately.   Objective:   Weight Range: 142 lb 8 oz (64.6 kg) Body mass index is 20.45 kg/m.   Vital Signs:   Temp:  [97.6 F (36.4 C)-98.2 F (36.8 C)] 98.1 F (36.7 C) (12/28 0658) Pulse Rate:  [75-117] 85 (12/28 0658) Resp:  [13-24] 18 (12/28 0658) BP: (60-120)/(40-80) 102/67 (12/28 0658) SpO2:  [5 %-100 %] 5 % (12/28 0658) Weight:  [142 lb 8 oz (64.6 kg)-160 lb (72.6 kg)] 142 lb 8 oz (64.6 kg) (12/28 0658) Last BM Date: 12/20/17  Weight change: Filed Weights   12/20/17 1245 12/20/17 1854 12/21/17 0658  Weight: 160 lb (72.6 kg) 144 lb 3.2 oz (65.4 kg) 142 lb 8 oz (64.6 kg)    Intake/Output:   Intake/Output Summary (Last 24 hours) at 12/21/2017 0855 Last data filed at 12/21/2017 0600 Gross per 24 hour  Intake 730 ml  Output 2725 ml  Net -1995 ml      Physical Exam    General: Well appearing. No resp difficulty. HEENT: Normal Neck: Supple. JVP 6-7. Carotids 2+ bilat; no bruits. No thyromegaly or nodule noted. Cor: PMI nondisplaced. RRR, No M/G/R noted Lungs: CTAB, normal effort. Abdomen: Soft, non-tender, non-distended, no HSM. No bruits or masses. +BS  Extremities: No cyanosis, clubbing, or rash. R and LLE no edema.  Neuro: Alert & orientedx3, cranial nerves grossly intact. moves all 4 extremities w/o difficulty. Affect pleasant   Telemetry   NSR 70-80s, personally reviewed  EKG    No new tracings.   Labs    CBC Recent Labs    12/20/17 1256 12/21/17 0513  WBC 18.1* 7.8  HGB 11.4* 11.5*  HCT 34.4* 35.9*  MCV 92.0 93.7  PLT 503* 404*   Basic Metabolic Panel Recent Labs    16/10/96 1256 12/20/17 2238 12/21/17 0513  NA 129*  --  135  K 5.2*  4.6 4.9  CL 101  --  103  CO2 20*  --  24  GLUCOSE 94  --  92  BUN 24*  --  15  CREATININE 2.01*  --  1.23  CALCIUM 8.1*  --  8.4*  MG 2.2  --   --    Liver Function Tests Recent Labs    12/20/17 1256  AST 25  ALT 18  ALKPHOS 89  BILITOT 1.1  PROT 5.8*  ALBUMIN 2.9*   No results for input(s): LIPASE, AMYLASE in the last 72 hours. Cardiac Enzymes Recent Labs    12/20/17 1256  TROPONINI 0.29*    BNP: BNP (last 3 results) No results for input(s): BNP in the last 8760 hours.  ProBNP (last 3 results) No results for input(s): PROBNP in the last 8760 hours.   D-Dimer No results for input(s): DDIMER in the last 72 hours. Hemoglobin A1C No results for input(s): HGBA1C in the last 72 hours. Fasting Lipid Panel No results for input(s): CHOL, HDL, LDLCALC, TRIG, CHOLHDL, LDLDIRECT in the last 72 hours. Thyroid Function Tests No results for input(s): TSH, T4TOTAL, T3FREE, THYROIDAB in the last 72 hours.  Invalid input(s): FREET3  Other results:   Imaging    Ct Head Wo Contrast  Result Date: 12/20/2017  CLINICAL DATA:  Syncope EXAM: CT HEAD WITHOUT CONTRAST TECHNIQUE: Contiguous axial images were obtained from the base of the skull through the vertex without intravenous contrast. COMPARISON:  11/29/2017 FINDINGS: Brain: No acute territorial infarction, hemorrhage or intracranial mass is visualized. 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 Small old appearing infarct in the left cerebellum. Mild small vessel ischemic changes of the white matter. Mild to moderate atrophy. Stable ventricle size. Vascular: No hyperdense vessels.  Carotid artery calcification Skull: No fracture Sinuses/Orbits: Mild mucosal thickening in the ethmoid sinuses. No acute orbital abnormality. Other: None IMPRESSION: 1. No CT evidence for acute intracranial abnormality. 2. Atrophy and mild small vessel ischemic changes of the white matter. Electronically Signed   By: Jasmine PangKim  Fujinaga M.D.   On:  12/20/2017 14:02   Dg Chest Port 1 View  Result Date: 12/20/2017 CLINICAL DATA:  56 year old male status post CABG on 12/07/2017. Syncopal episode, falls. Hypotensive. EXAM: PORTABLE CHEST 1 VIEW COMPARISON:  12/12/2017 and earlier. FINDINGS: Portable AP upright view at 1300 hours. Progressed small to moderate left pleural effusion with associated elevation of the left hemidiaphragm. No pneumothorax or pulmonary edema. Trace if any residual right pleural effusion prior CABG. Negative visible bowel gas pattern. Stable cardiac size and mediastinal contours. Nonacute anterior right rib fractures. IMPRESSION: 1. Unresolved and mildly progressed left pleural effusion since 12/12/2017. Superimposed elevated left hemidiaphragm. 2. No other acute cardiopulmonary abnormality identified. Electronically Signed   By: Odessa FlemingH  Hall M.D.   On: 12/20/2017 13:23      Medications:     Scheduled Medications: . amiodarone  200 mg Oral Daily  . aspirin EC  81 mg Oral Daily  . atorvastatin  80 mg Oral q1800  . enoxaparin (LOVENOX) injection  40 mg Subcutaneous Q24H  . feeding supplement (ENSURE ENLIVE)  237 mL Oral BID BM     Infusions:   PRN Medications:      Patient Profile   [name] is a 56 yo white male with h/o ETOH abuse, VF arrest, CAD s/p CABG 12/07/17 and ischemic CM with EF 25-30% who presents with syncope, orthostasis and syncope  Assessment/Plan   1. Syncope or orthostatic hypotension - Likely related to volume depletion from Entresto and spironolactone. Now on hold.  - Creatinine much improved this am.  - Would continue to hold Entresto and spiro, and send home on only Losartan 12.5 mg QHS. - head CT (Personally reviewed) shows no evidence of bleed  2. AKI - As above related ATN from overdiuresis.  [name]- Spiro and Sherryll BurgerEntresto on hold.  - Now much improved with hydration.   - Ok for discharge from HF perspective. Will keep 1/8 appt and schedule labs for next week.   3. CAD  - S/p  recent CABG. - No s/s of ischemia.    - continue statin, ASA. Hold b-blocker for now with low BP  4. Ischemic cardiomyopathy - EF 25-30% pre CABG - Volume status stable to low.  - BP improved with hydration.  - dig level is high. Will stop dig completely.   5. Elevated left hemidiaphragm on CXR with small to moderate left pleural effusion - has f/u with TCTs. No change.   Will work for transportation for next week.   Medication concerns reviewed with patient and pharmacy team. Barriers identified: None at this time.   Length of Stay: 1  Luane SchoolMichael Andrew Tillery, PA-C  12/21/2017, 8:55 AM  Advanced Heart Failure Team Pager [phone] (M-F; 7a - 4p)  Please  contact [facility] for night-coverage after hours (4p -7a ) and weekends on [email or web address]  Patient seen and examined with the above-signed Advanced Practice Provider and/or Housestaff. I personally reviewed laboratory data, imaging studies and relevant notes. I independently examined the patient and formulated the important aspects of the plan. I have edited the note to reflect any of my changes or salient points. I have personally discussed the plan with the patient and/or family.  Much better today. No longer orthostatic. Creatinine abc to baseline. Can go home today.  Would hold entresto and cut spiro to 12.5  Meds for d/c: ECASA 81 Spiro 12.5 daily (decreasd dose) Losartan 12.5 mg Daily  Atorva 80 daily Carvedilol 3.125 bid Amiodarone 200 mg daily  I will see him in clinic next Thursday with labs.  Arvilla Meresaniel Nollie Shiflett, MD  10:40 AM

## 2017-12-21 NOTE — Plan of Care (Signed)
  Elimination: Will not experience complications related to urinary retention 12/21/2017 0424 - Completed/Met by Evert Kohl, RN   Pain Managment: General experience of comfort will improve 12/21/2017 0424 - Completed/Met by Evert Kohl, RN   Elimination: Will not experience complications related to bowel motility 12/21/2017 0424 - Completed/Met by Evert Kohl, RN

## 2017-12-21 NOTE — Discharge Instructions (Addendum)
Please decrease your spironolactone dose to 12.5mg  daily.  Please stop taking the digoxin and entresto. Please start taking losartan 12.5mg  daily. Please continue to take aspirin 81mg  daily, atorvastatin 80mg  daily, carvedilol 3.125mg  twice a day, and amiodarone 200mg  daily.  Please follow up with the heart doctors on 12/27/2017 at 10:40am.

## 2017-12-21 NOTE — Progress Notes (Signed)
Subjective:  [name] was seen lying in bed comfortably this morning. He denies dizziness or lightheadedness this morning. Denies shortness of breath. Reports that he was able to ambulate in his room a little this morning without issue. Endorses rib pain that is chronic as well as midline back pain.  Objective:  Vital signs in last 24 hours: Vitals:   12/20/17 1800 12/20/17 1854 12/20/17 2353 12/21/17 0205  BP: (!) 102/59 120/80 100/60 108/71  Pulse: 85 81 87 84  Resp: 18 18 16 18   Temp:  97.8 F (36.6 C) 98.2 F (36.8 C) 98 F (36.7 C)  TempSrc:  Oral Oral Oral  SpO2: 100% 98% 96% 95%  Weight:  144 lb 3.2 oz (65.4 kg)    Height:  5\' 10"  (1.778 m)     GEN: Well-appearing, well-nourished. Alert and oriented. No acute distress.  RESP: Clear to auscultation bilaterally. No wheezes, rales, or rhonchi. No increased work of breathing. CV: Normal rate and regular rhythm. No murmurs, gallops, or rubs. No LE edema. ABD: Soft. Non-tender. Non-distended. Normoactive bowel sounds. EXT: No edema. Warm and well perfused. BACK: Tender to palpation on vertebral around level T2-3. No bruising or skin breaks evident. NEURO: Cranial nerves II-XII grossly intact. Able to lift all four extremities against gravity. No apparent audiovisual hallucinations. Speech fluent and appropriate. PSYCH: Patient is calm and pleasant. Appropriate affect. Well-groomed; speech is appropriate and on-subject.  Labs CBC Latest Ref Rng & Units 12/21/2017 12/20/2017 12/13/2017  WBC 4.0 - 10.5 K/uL 7.8 18.1(H) 6.9  Hemoglobin 13.0 - 17.0 g/dL 11.5(L) 11.4(L) 10.3(L)  Hematocrit 39.0 - 52.0 % 35.9(L) 34.4(L) 31.1(L)  Platelets 150 - 400 K/uL 404(H) 503(H) 315   CMP Latest Ref Rng & Units 12/21/2017 12/20/2017 12/20/2017  Glucose 65 - 99 mg/dL 92 - 94  BUN 6 - 20 mg/dL 15 - 16(X24(H)  Creatinine 0.61 - 1.24 mg/dL 0.961.23 - 0.45(W2.01(H)  Sodium 135 - 145 mmol/L 135 - 129(L)  Potassium 3.5 - 5.1 mmol/L 4.9 4.6 5.2(H)  Chloride  101 - 111 mmol/L 103 - 101  CO2 22 - 32 mmol/L 24 - 20(L)  Calcium 8.9 - 10.3 mg/dL 0.9(W8.4(L) - 8.1(L)  Total Protein 6.5 - 8.1 g/dL - - 5.8(L)  Total Bilirubin 0.3 - 1.2 mg/dL - - 1.1  Alkaline Phos 38 - 126 U/L - - 89  AST 15 - 41 U/L - - 25  ALT 17 - 63 U/L - - 18   Assessment/Plan:  Active Problems:   Acute kidney injury (HCC)   Syncope  [name] is a 56 yo M with a history of CAD (s/p CABG x 4 on 12/07/17 following presentation with V.Fib arrest) who presents with syncope.  Syncope, Fall Orthostatic vitals positive on admission. S/p 1L IVF bolus in ED. Repeat orthostatics negative. Syncope most likely related to volume depletion from entresto and spironolactone per heart failure team. - Heart Failure Team consulted; appreciate their recommendations. - Repeat orthostatic vitals - Will discharge with decreased dose of spironolactone (12.5mg  daily) - Digoxin and entresto discontinued - Discharge with ASA 81mg  daily, losartan 12.5mg  daily, atorvastatin 80mg  daily, carvedilol 3.125mg  BID, and amiodarone 200mg  daily. - Plan to f/u with Heart Failure outpatient on 1/3 - Patient being discharged with medications in hand. - Discharge today  Back Pain Around the level of T2-3. Thoracic x-ray negative for acute fracture. May be MSK or contusion from fall yesterday. No bruising evident on exam. - acetaminophen 650mg  PRN for pain  AKI,  resolved Cr now 1.23, down from 2.01 on admission, s/p 1L IVF bolus. Likely 2/2 volume depletion from entresto and spironolactone as above. Hyponatremia and hyperkalemia resolved.  CAD Ischemic Cardiomyopathy S/P CABG x4 earlier this month, following V. Fib arrest. Troponin 0.29 on admission; In the setting of hypotension in a patient with ischemic cardiomyopathy and recent CABG x4. Likely Type II NSTEMI. EF 25-30% pre-CABG (11/30/17) and 40-45% post CABG (12/04/17). - Appreciate Heart Failure Team Recommendations - Continue home atorvastatin -  Continue home ASA - Will discharge with above medications. - Discontinue Digoxin  Dispo: Anticipated discharge today.  Scherrie GerlachHuang, Reiley Bertagnolli, MD 12/21/2017, 6:27 AM Pager: Demetrius CharityP [phone]

## 2017-12-21 NOTE — Plan of Care (Signed)
  Education: Knowledge of General Education information will improve 12/21/2017 0732 - Progressing by Chaya Janunn, Athan Casalino K, RN

## 2017-12-21 NOTE — Progress Notes (Signed)
I went in to speak with [name] regarding his possible discharge today and follow-up appt in the [facility]. I will ensure that he has cab transportation to his follow-up appt in the [facility] Clinic on 12/27/17 at 10:40 with Dr. [name].  Mr [name] is aware of the appt and cab ride.  He does say that he will not have transportation home from the hospital today when discharged.  I have informed the Care Manager of his lack of transportation home.

## 2017-12-21 NOTE — Plan of Care (Signed)
  Education: Knowledge of General Education information will improve 12/21/2017 0423 - Completed/Met by Evert Kohl, RN   Clinical Measurements: Ability to maintain clinical measurements within normal limits will improve 12/21/2017 0423 - Completed/Met by Garnett-Mellinger, Sophronia Simas, RN

## 2017-12-21 NOTE — Progress Notes (Addendum)
Patient meds filled through the HF Fund at the time of discharge last hospitalization due to lack of insurance and inability to afford.  Losartan has been added per Dr. [name] this hospitalization and I sent this prescription to [facility] to be filled through the HF Fund. I delivered this medication to his bedside.  I also reconciled his home meds (he has them here) and reviewed changes made to his medications for home.  He acknowledges understanding and can teach back.

## 2017-12-21 NOTE — Plan of Care (Signed)
  Pain Managment: General experience of comfort will improve 12/21/2017 0424 - Completed/Met by Evert Kohl, RN   Safety: Ability to remain free from injury will improve 12/21/2017 0424 - Completed/Met by Garnett-Mellinger, Sophronia Simas, RN

## 2017-12-21 NOTE — Discharge Summary (Signed)
Name: [name] MRN: [id number] DOB: 07/28/1961 56 y.o. PCP: Patient, No Pcp Per  Date of Admission: 12/20/2017 12:40 PM Date of Discharge: 12/21/2017 Attending Physician: Gust RungHoffman, Erik C, DO  Discharge Diagnosis: 1. Syncope 2. AKI  Principal Problem:   Orthostatic hypotension Active Problems:   Acute kidney injury [facility](HCC)   Syncope   Discharge Medications: Allergies as of 12/21/2017   No Known Allergies     Medication List    STOP taking these medications   digoxin 0.125 MG tablet Commonly known as:  LANOXIN   oxyCODONE 5 MG immediate release tablet Commonly known as:  Oxy IR/ROXICODONE   sacubitril-valsartan 24-26 MG Commonly known as:  ENTRESTO     TAKE these medications   amiodarone 200 MG tablet Commonly known as:  PACERONE Take 1 tablet (200 mg total) by mouth daily. Start taking on:  12/22/2017 What changed:    when to take this  additional instructions   ASPIRIN ADULT LOW STRENGTH 81 MG EC tablet Generic drug:  aspirin Take 81 mg by mouth daily. What changed:  Another medication with the same name was removed. Continue taking this medication, and follow the directions you see here.   atorvastatin 80 MG tablet Commonly known as:  LIPITOR Take 1 tablet (80 mg total) by mouth daily at 6 PM.   carvedilol 3.125 MG tablet Commonly known as:  COREG Take 1 tablet (3.125 mg total) by mouth 2 (two) times daily with a meal.   losartan 25 MG tablet Commonly known as:  COZAAR Take 0.5 tablets (12.5 mg total) by mouth daily.   spironolactone 25 MG tablet Commonly known as:  ALDACTONE Take 0.5 tablets (12.5 mg total) by mouth daily. What changed:  how much to take       Disposition and follow-up:   Jose Marvis MoellerClay [name] [facility] Good [facility].  At the hospital follow up visit please address:  1.  Any more episodes of syncope/near syncope? - Please ensure he is taking his medications. Entresto  and digoxin discontinued. Losartan 12.5mg  daily started. Spironolactone decreased to 12.5mg  daily. Continue carvedilol, aspirin, amiodarone, and atorvastatin.  2.  Labs / imaging needed at time of follow-up: BP, HR  3.  Pending labs/ test needing follow-up: None  Follow-up Appointments: Follow-up Information    [facility] - Dme Follow up.   Why:  a home health care nurse will go to your home Contact information: [address] [place] [address] [phone]        [name], Bevelyn Bucklesaniel R, MD. Go on 12/27/2017.   Specialty:  Cardiology Why:  at 10:40 AM in the Advanced [facility] Failure Clinic.  Please bring all medications to appt.  Cab will be to pick you up at 10:00-10:15. Contact information: [address] [place] [address] [phone]          Hospital Course by problem list: Principal Problem:   Orthostatic hypotension Active Problems:   Acute kidney injury (HCC)   Syncope   1. Syncope Initially admitted on 12/27 with brief syncopal episode with fall and left head trauma, as well as lightheadedness for 3 days. CT head negative. Orthostatics positive. Received 1L IVF bolus. Repeat orthostatics were negative. Syncopal episodes likely related to volume depletion secondary to entresto and spironolactone. Heart failure was consulted and they recommended decreasing spironolactone to half of his dose (12.5mg  daily), discontinuing digoxin and entresto, starting losartan 12.5mg  daily, and continuing ECASA  81mg  daily, atorvastatin 80mg  daily, carvedilol 3.125mg  BID, and amiodarone 200mg  daily. Patient will follow up with heart failure outpatient. He was discharged with medications in hand.  2. AKI Resolved with IVF. Likely secondary to volume depletion secondary to entresto and spironolactone.  3. Midline back pain Patient complaining of midline back pain around the level of T2-3. Thoracic x-ray negative for acute abnormality. Exam without  bruising or evidence of lesions. Possibly MSK in etiology from fall.  Discharge Vitals:   BP 104/66 (BP Location: Right Arm)   Pulse 85   Temp 97.9 F (36.6 C) (Oral)   Resp 18   Ht 5\' 10"  (1.778 m)   Wt 142 lb 8 oz (64.6 kg)   SpO2 97%   BMI 20.45 kg/m   Pertinent Labs, Studies, and Procedures:  CBC Latest Ref Rng & Units 12/21/2017 12/20/2017 12/13/2017  WBC 4.0 - 10.5 K/uL 7.8 18.1(H) 6.9  Hemoglobin 13.0 - 17.0 g/dL 11.5(L) 11.4(L) 10.3(L)  Hematocrit 39.0 - 52.0 % 35.9(L) 34.4(L) 31.1(L)  Platelets 150 - 400 K/uL 404(H) 503(H) 315   CMP Latest Ref Rng & Units 12/21/2017 12/20/2017 12/20/2017  Glucose 65 - 99 mg/dL 92 - 94  BUN 6 - 20 mg/dL 15 - 16(X24(H)  Creatinine 0.61 - 1.24 mg/dL 0.961.23 - 0.45(W2.01(H)  Sodium 135 - 145 mmol/L 135 - 129(L)  Potassium 3.5 - 5.1 mmol/L 4.9 4.6 5.2(H)  Chloride 101 - 111 mmol/L 103 - 101  CO2 22 - 32 mmol/L 24 - 20(L)  Calcium 8.9 - 10.3 mg/dL 0.9(W8.4(L) - 8.1(L)  Total Protein 6.5 - 8.1 g/dL - - 5.8(L)  Total Bilirubin 0.3 - 1.2 mg/dL - - 1.1  Alkaline Phos 38 - 126 U/L - - 89  AST 15 - 41 U/L - - 25  ALT 17 - 63 U/L - - 18   Mg 2.2 Trop 0.29 Dig level 1.2 Lactic acid 1.51  CXR 12/20/2017 1. Unresolved and mildly progressed left pleural effusion since 12/12/2017. Superimposed elevated left hemidiaphragm. 2. No other acute cardiopulmonary abnormality identified.  CT head 12/20/2017 1. No CT evidence for acute intracranial abnormality. 2. Atrophy and mild small vessel ischemic changes of the white matter.  Thoracic x-ray 12/21/2017 Post CABG. Minimal dextroconvex thoracic scoliosis and osseous demineralization. No acute thoracic spine abnormalities.  Discharge Instructions: Discharge Instructions    (HEART FAILURE PATIENTS) Call MD:  Anytime you have any of the following symptoms: 1) 3 pound weight gain in 24 hours or 5 pounds in 1 week 2) shortness of breath, with or without a dry hacking cough 3) swelling in the hands, feet or stomach  4) if you have to sleep on extra pillows at night in order to breathe.   Complete by:  As directed    Call MD for:  persistant dizziness or light-headedness   Complete by:  As directed    Call MD for:  redness, tenderness, or signs of infection (pain, swelling, redness, odor or green/yellow discharge around incision site)   Complete by:  As directed    Call MD for:  severe uncontrolled pain   Complete by:  As directed    Call MD for:  temperature >100.4   Complete by:  As directed    Diet - low sodium heart healthy   Complete by:  As directed    Increase activity slowly   Complete by:  As directed       Signed: Scherrie GerlachHuang, Alette Kataoka, MD 12/21/2017, 1:04 PM   Pager: Demetrius CharityP [phone]

## 2017-12-21 NOTE — Clinical Social Work Note (Signed)
Clinical Social Work Assessment  Patient Details  Name: [name] MRN: [id number] Date of Birth: [birth year]  Date of referral:  12/21/17               Reason for consult:  Transportation, [employer] sought to share information with:    Permission granted to share information::  No  Name::        Agency::     Relationship::     Contact Information:     Housing/Transportation Living arrangements for the past 2 months:  Single Family Home Source of Information:  Patient, Medical Team Patient Interpreter Needed:  None Criminal Activity/Legal Involvement Pertinent to Current Situation/Hospitalization:  No - Comment as needed Significant Relationships:  Siblings Lives with:  Siblings Do you feel safe going back to the place where you live?  Yes Need for family participation in patient care:  Yes (Comment)  Care giving concerns:  Consult for no income, insurance, or transportation.   Social Worker assessment / plan:  CSW met with patient. No supports at bedside. CSW introduced role and inquired about needs. Patient confirmed he needed a cab voucher to return home. He and family cannot afford to pay for a cab out of pocket and no one can pick him up. Patient has no income and has applied for disability twice. His brother recently moved in with him so he could pay the rent. Patient agreeable to receiving resources. CSW provided [facility] community resources packet and information on [facility]. Cab voucher approved by Surveyor, quantity of social work and placed at [employer] for RN to call when patient is ready. No further concerns. CSW signing off as social work intervention is no longer needed.  Employment status:  Unemployed Forensic scientist:  Self Pay (Medicaid Pending) PT Recommendations:  Not assessed at this time Information / Referral to community resources:  Other (Comment Required)(Community resources.)  Patient/Family's Response  to care:  Patient agreeable to receiving resources. Patient's siblings supportive and involved in patient's care. Patient appreciated social work intervention.  Patient/Family's Understanding of and Emotional Response to Diagnosis, Current Treatment, and Prognosis:  Patient has a good understanding of the reason for admission and social work consult. Patient appears happy with hospital care.  Emotional Assessment Appearance:  Appears stated age Attitude/Demeanor/Rapport:  Other(Pleasant) Affect (typically observed):  Accepting, Appropriate, Calm, Pleasant Orientation:  Oriented to Self, Oriented to Place, Oriented to  Time, Oriented to Situation Alcohol / Substance use:  Never Used Psych involvement (Current and /or in the community):  No (Comment)  Discharge Needs  Concerns to be addressed:  Care Coordination Readmission within the last 30 days:  Yes Current discharge risk:  None Barriers to Discharge:  No Barriers Identified   Candie Chroman, LCSW 12/21/2017, 1:06 PM

## 2017-12-25 DIAGNOSIS — Z48812 Encounter for surgical aftercare following surgery on the circulatory system: Secondary | ICD-10-CM

## 2017-12-27 ENCOUNTER — Ambulatory Visit (HOSPITAL_COMMUNITY)
Admit: 2017-12-27 | Discharge: 2017-12-27 | Disposition: A | Discharge status: Home / Self Care | Payer: Medicaid Other | Source: Ambulatory Visit | Attending: Internal Medicine | Admitting: Internal Medicine

## 2017-12-27 VITALS — BP 122/64 | HR 73 | Wt 153.1 lb

## 2017-12-27 DIAGNOSIS — E7800 Pure hypercholesterolemia, unspecified: Secondary | ICD-10-CM | POA: Diagnosis not present

## 2017-12-27 DIAGNOSIS — Z8674 Personal history of sudden cardiac arrest: Secondary | ICD-10-CM | POA: Diagnosis not present

## 2017-12-27 DIAGNOSIS — Z7982 Long term (current) use of aspirin: Secondary | ICD-10-CM | POA: Diagnosis not present

## 2017-12-27 DIAGNOSIS — N179 Acute kidney failure, unspecified: Secondary | ICD-10-CM

## 2017-12-27 DIAGNOSIS — K219 Gastro-esophageal reflux disease without esophagitis: Secondary | ICD-10-CM | POA: Diagnosis not present

## 2017-12-27 DIAGNOSIS — Z79899 Other long term (current) drug therapy: Secondary | ICD-10-CM | POA: Diagnosis not present

## 2017-12-27 DIAGNOSIS — I252 Old myocardial infarction: Secondary | ICD-10-CM | POA: Insufficient documentation

## 2017-12-27 DIAGNOSIS — I110 Hypertensive heart disease with heart failure: Secondary | ICD-10-CM | POA: Diagnosis not present

## 2017-12-27 DIAGNOSIS — I5022 Chronic systolic (congestive) heart failure: Secondary | ICD-10-CM | POA: Diagnosis present

## 2017-12-27 DIAGNOSIS — I2510 Atherosclerotic heart disease of native coronary artery without angina pectoris: Secondary | ICD-10-CM | POA: Diagnosis not present

## 2017-12-27 DIAGNOSIS — Z951 Presence of aortocoronary bypass graft: Secondary | ICD-10-CM | POA: Insufficient documentation

## 2017-12-27 LAB — CBC
HEMATOCRIT: 36.7 % — AB (ref 39.0–52.0)
HEMOGLOBIN: 11.8 g/dL — AB (ref 13.0–17.0)
MCH: 30.3 pg (ref 26.0–34.0)
MCHC: 32.2 g/dL (ref 30.0–36.0)
MCV: 94.1 fL (ref 78.0–100.0)
Platelets: 385 10*3/uL (ref 150–400)
RBC: 3.9 MIL/uL — ABNORMAL LOW (ref 4.22–5.81)
RDW: 13.6 % (ref 11.5–15.5)
WBC: 9.6 10*3/uL (ref 4.0–10.5)

## 2017-12-27 LAB — BASIC METABOLIC PANEL
Anion gap: 7 (ref 5–15)
BUN: 7 mg/dL (ref 6–20)
CALCIUM: 9.3 mg/dL (ref 8.9–10.3)
CHLORIDE: 102 mmol/L (ref 101–111)
CO2: 28 mmol/L (ref 22–32)
Creatinine, Ser: 1.09 mg/dL (ref 0.61–1.24)
GFR calc Af Amer: >60 mL/min (ref >60)
GFR calc non Af Amer: >60 mL/min (ref >60)
GLUCOSE: 91 mg/dL (ref 65–99)
POTASSIUM: 4.2 mmol/L (ref 3.5–5.1)
Sodium: 137 mmol/L (ref 135–145)

## 2017-12-27 MED ORDER — FUROSEMIDE 20 MG PO TABS: 20.0000 mg | ORAL_TABLET | ORAL | 3 refills | Status: DC | PRN

## 2017-12-27 MED ORDER — LOSARTAN POTASSIUM 25 MG PO TABS: 25.0000 mg | ORAL_TABLET | Freq: Every day | ORAL | 0 refills | Status: DC

## 2017-12-27 MED FILL — FUROSEMIDE 20 MG TABS: 20 | 30 days supply | Qty: 30 | Fill #0

## 2017-12-27 MED FILL — LOSARTAN POTASSIUM 25 MG TA: 25 | 34 days supply | Qty: 34 | Fill #0

## 2017-12-27 NOTE — Patient Instructions (Signed)
Increase Losartan to 25 mg (1 tab) DAILY  Start Furosemide (Lasix) 20 mg AS NEEDED for weight gain or swelling  Labs today  Your physician recommends that you schedule a follow-up appointment in: 4 weeks with Fara ChuteErika, Pharm D  Your physician recommends that you schedule a follow-up appointment in: 7 weeks with Otilio SaberAndy Tillery, PA

## 2017-12-27 NOTE — Progress Notes (Signed)
Heart Failure Clinic Social Work Assessment  [name]  presents today in association with an Advanced Heart Failure Clinic Appointment.  Patient seen today by CSW for follow up/assistance on Financial difficulties and/or  Food, Interior and spatial designerinances and Transportation   Social Determinants impacting successful heart failure regimen:  Housing: patient lives with his brother. Food: Do you have enough food? Yes Patient has food stamps for $192 monthly Do you know and understand healthy eating and how that affects your heart failure diagnosis? Yes  Do you follow a low salt diet?  Yes  Utilities: Do you have gas and/or electricity on in your home? Yes  Income: What is your source of income? Pending disability Insurance: pending medicaid Transportation: Do you have transportation to your medical appointments? No Patient was provided taxi ride for today's visit.    Daily Health Needs: Do you have a scale and weigh each day?  Yes  Do you have your medications? Yes  Are you able to adhere to your medication regimen? Yes   Do you ever take medications differently than prescribed? No  Do you know the zones of Heart Failure?  Yes  Do you know how to contact the HF Clinic appropriately with worsening symptoms or weight increases?  Yes   Do you have any identified obstacles / challenges for adherence to current treatment plan?Yes  Challenged with lack of income and insurance.  Patient appears to be overwhelmed with health needs and pending medicaid/Social Security.   CSW assisted patient with [employer] [facility] Supportive Counseling with the goal to Increase adequate support systems for patient/family   Heart Failure Clinic Social Worker will continue to coordinate and monitor patient's treatment plan as needed.  CSW continues to be available for identified needs. Jose BeechJackie Gerilynn Mccullars, LCSW, CCSW-MCS [phone]

## 2017-12-27 NOTE — Progress Notes (Signed)
Prescriptions for Losartan and Furosemide [facility]

## 2017-12-27 NOTE — Progress Notes (Signed)
Advanced Heart Failure Clinic Note   Primary Cardiologist: Dr. Gala [name]   HPI:  [name] is a 57 y.o. male with h/o ETOH, VF Arrest, CAD s/p CABG 12/07/17, and ICM with EF 25-30%.  Admitted 12/6 - 12/14/17 with V-Fib arrest while chopping wood. Rrceived CPR in the field. R/LHC with severe 3vD as below and cardiogenic shock requiring IABP. Required milrinone with continued shock. Pt improved and IABP removed 12/10, but pt had recurrent VF arrest so IABP replaced. Stabilized and underwent CABG 12/07/17. Tolerated gradual wean of meds and extubation with no furthe events. HF medications titrated as tolerated.  Admitted 12/27 -> 12/28 with syncope and AKI thought to be 2/2 volume depletion on lasix, spiro, and Entresto. CT negative for ICH.   Improved rapidly with holding meds and gentle IVF. Entresto stopped. Spiro cut back and switched to low dose losartan.   Pt presents today for post hospital follow up. Not a great historian. Feeling OK overall. He states he gets SOB changing his clothes, but he can walk around the neighborhood without SOB. Remains tired and with low energy level. Remains mildly lightheaded with rapid standing, but not marked or limiting. Says only lasts a few seconds if he stands up too quickly. Denies exertional CP. Still having positional soreness and stiffness at surgical sites.  Mild peripheral edema R>L. No further near syncope. Has been taking all of his medications. Not weighing at home.  Review of systems complete and found to be negative unless listed in HPI.    TTE 11/30/17 25-30% RV normal.  TTE 12/04/2017 LVEF 40-45% (On IABP) TEE 12/07/17 LVEF 45-50% (On IABP)  LHC 11/29/17 Left Main  Dist LM lesion 40% stenosed  Left Anterior Descending  Prox LAD-1 lesion is 60% stenosed. The lesion is calcified.  Prox LAD-2 lesion 80% stenosed  First Diagonal Branch  Vessel is small in size.  Ost 1st Diag to 1st Diag lesion 70% stenosed  Second Diagonal  Branch  Vessel is small in size. There is mild disease in the vessel.  Left Circumflex  Ost Cx lesion 30% stenosed  Prox Cx lesion is 80% stenosed. The lesion is calcified.  Prox Cx to Mid Cx lesion 80% stenosed  First Obtuse Marginal Branch  Vessel is small in size.  Ost 1st Mrg lesion 90% stenosed  Second Obtuse Marginal Branch  Ost 2nd Mrg to 2nd Mrg lesion 60% stenosed  Right Coronary Artery  Collaterals  Mid RCA filled by collaterals from Prox RCA.    Prox RCA to Mid RCA lesion is 100% stenosed. The lesion is chronically occluded with bridging and left-to-right collateral flow.  Right Posterior Descending Artery  Collaterals  RPDA filled by collaterals from 2nd Sept.   Past Medical History:  Diagnosis Date  . Arthritis    "hands; maybe my toes" (12/20/2017)  . Cardiac arrest (HCC) 11/29/2017   hx VF arrest/notes 12/20/2017  . Coronary artery disease   . GERD (gastroesophageal reflux disease)   . H/O ETOH abuse    /notes 12/20/2017  . High cholesterol   . Hypertension   . Seizures (HCC) ~ 2016; ~ 2017   "I've had a couple; I was at work" (12/20/2017)  . Syncope and collapse    "last few days" (12/20/2017)   Current Outpatient Medications  Medication Sig Dispense Refill  . amiodarone (PACERONE) 200 MG tablet Take 1 tablet (200 mg total) by mouth daily. 30 tablet 0  . ASPIRIN ADULT LOW STRENGTH 81 MG EC tablet Take  81 mg by mouth daily.  0  . atorvastatin (LIPITOR) 80 MG tablet Take 1 tablet (80 mg total) by mouth daily at 6 PM. 30 tablet 3  . carvedilol (COREG) 3.125 MG tablet Take 1 tablet (3.125 mg total) by mouth 2 (two) times daily with a meal. 60 tablet 3  . losartan (COZAAR) 25 MG tablet Take 0.5 tablets (12.5 mg total) by mouth daily. 30 tablet 0  . spironolactone (ALDACTONE) 25 MG tablet Take 0.5 tablets (12.5 mg total) by mouth daily. 30 tablet 0   No current facility-administered medications for this encounter.    No Known Allergies   Social History    Socioeconomic History  . Marital status: Divorced    Spouse name: Not on file  . Number of children: Not on file  . Years of education: Not on file  . Highest education level: Not on file  Social Needs  . Financial resource strain: Not on file  . Food insecurity - worry: Not on file  . Food insecurity - inability: Not on file  . Transportation needs - medical: Not on file  . Transportation needs - non-medical: Not on file  Occupational History  . Not on file  Tobacco Use  . Smoking status: Never Smoker  . Smokeless tobacco: Never Used  Substance and Sexual Activity  . Alcohol use: Yes    Comment: 12/20/2017 "I quit drinking after I had the heart attack"  . Drug use: No  . Sexual activity: Not Currently  Other Topics Concern  . Not on file  Social History Narrative  . Not on file   Family History  Problem Relation Age of Onset  . Heart attack Other    Vitals:   12/27/17 1126  BP: 122/64  Pulse: 73  SpO2: 100%  Weight: 153 lb 2 oz (69.5 kg)   Wt Readings from Last 3 Encounters:  12/27/17 153 lb 2 oz (69.5 kg)  12/21/17 142 lb 8 oz (64.6 kg)  12/13/17 149 lb 14.4 oz (68 kg)   Orthostatics Sitting 126/78 Standing 130/80  PHYSICAL EXAM: General:  NAD  HEENT: normal. Anicteric Neck: supple. no JVD. Carotids 2+ bilat; no bruits. No lymphadenopathy or thyromegaly appreciated. Cor: PMI nondisplaced. Regular rate & rhythm. No rubs, gallops or murmurs. Sternotomy healing well Lungs: clear Abdomen: soft, nontender, nondistended. No hepatosplenomegaly. No bruits or masses. Good bowel sounds. Extremities: no cyanosis, clubbing, rash, trace edema. Warm. Good cap refill  Neuro: alert & oriented x 3, cranial nerves grossly intact. moves all 4 extremities w/o difficulty. Affect pleasant.  ASSESSMENT & PLAN:  1. Chronic systolic CHF with ICM - NYHA III symptoms - Volume status looks OK. - Not on lasix currently - Continue losartan 12.5 mg daily. Did not tolerate  Entresto with orthostasis, syncope, and AKI - Continue spiro 12.5 mg qhs. BMET today.  - Continue coreg 3.125 mg BID for now.  - Reinforced fluid restriction to < 2 L daily, sodium restriction to less than 2000 mg daily, and the importance of daily weights.    2. CAD s/p CABG 12/07/2017 - No s/s of ischemia.    - Continue atorvastatin 80 mg daily - Continue ASA 81 mg daily.    3. Recent AKI - BMET today.   4. VF arrest in setting of STEMI - Has been on amiodarone. OK to stop.    Graciella FreerMichael Andrew Tillery, PA-C 12/27/17   Patient seen and examined with the above-signed Advanced Practice Provider and/or Housestaff. I personally reviewed  laboratory data, imaging studies and relevant notes. I independently examined the patient and formulated the important aspects of the plan. I have edited the note to reflect any of my changes or salient points. I have personally discussed the plan with the patient and/or family.  He is much improved. Volume status stable. Not orthostatic (BPs checked personally). Renal function improved. Will increase losartan (failed Entresto due to hypotension and AKI). Can use lasix as needed. Continue spiro and carvedilol. Unable to attend CR due to lack of transportation. Can stop amio. Repeat echo in 2-3 months to assess need for ICD. F/u with PharmD for ongoing med titration.   Arvilla Meres, MD  9:18 PM

## 2017-12-28 MED FILL — Heparin Sodium (Porcine) Inj 1000 Unit/ML: INTRAMUSCULAR | Qty: 20 | Status: AC

## 2017-12-28 MED FILL — Sodium Bicarbonate IV Soln 8.4%: INTRAVENOUS | Qty: 50 | Status: AC

## 2017-12-28 MED FILL — Mannitol IV Soln 20%: INTRAVENOUS | Qty: 500 | Status: AC

## 2017-12-28 MED FILL — Heparin Sodium (Porcine) Inj 1000 Unit/ML: INTRAMUSCULAR | Qty: 30 | Status: AC

## 2017-12-28 MED FILL — Sodium Chloride IV Soln 0.9%: INTRAVENOUS | Qty: 2000 | Status: AC

## 2017-12-28 MED FILL — Lidocaine HCl IV Inj 20 MG/ML: INTRAVENOUS | Qty: 10 | Status: AC

## 2017-12-28 MED FILL — Electrolyte-R (PH 7.4) Solution: INTRAVENOUS | Qty: 6000 | Status: AC

## 2017-12-28 MED FILL — Calcium Chloride Inj 10%: INTRAVENOUS | Qty: 10 | Status: AC

## 2018-01-01 ENCOUNTER — Encounter (HOSPITAL_COMMUNITY): Payer: Self-pay

## 2018-01-07 ENCOUNTER — Telehealth: Payer: Self-pay | Admitting: Licensed Clinical Social Worker

## 2018-01-07 NOTE — Telephone Encounter (Signed)
CSW received call from [place] [facility] [phone] stating patient has challenges with transportation to and from MD appointments. [place] therefore not eligible for the [facility] card or county transport options. [facility] SW will explore distance from bus line and feasibility of getting to bus line and offering patient some bus passes. CSW will contact [facility] SW to discuss possible transport option offered by their clinic to his appointment on Wednesday. CSW available as needed and will follow up with patient on next clinic visit to discuss further possible transport options. Lasandra BeechJackie Tawanda Schall, LCSW, CCSW-MCS [phone]

## 2018-01-08 ENCOUNTER — Telehealth: Payer: Self-pay

## 2018-01-08 ENCOUNTER — Telehealth: Payer: Self-pay | Admitting: Licensed Clinical Social Worker

## 2018-01-08 NOTE — Telephone Encounter (Signed)
Call received from Massie MaroonJackie Brenna, LCSW inquiring about transportation for the patient to his medical appointments. [place]. Provided her with information for [facility].  They provide [place] service [place] on Tuesdays between 0900-1200. The cost is $10.00 round trip and the ride needs to be scheduled 2-3 days in advance. The patient must call [facility] to register.

## 2018-01-08 NOTE — Telephone Encounter (Signed)
CSW left message for patient to return call to discuss further potential transportation option through [facility]( [place]). Patient's neighbor will return call later today with patient to discuss. Lasandra BeechJackie Schon Zeiders, LCSW, CCSW-MCS [phone]

## 2018-01-09 ENCOUNTER — Inpatient Hospital Stay: Payer: Self-pay | Admitting: Critical Care Medicine

## 2018-01-09 ENCOUNTER — Telehealth: Payer: Self-pay | Admitting: Licensed Clinical Social Worker

## 2018-01-09 NOTE — Telephone Encounter (Signed)
CSW continuing to reach patient who has no transportation to upcoming appointments. [place] and has no phone and only access to communication is thorough his neighbor who has a cell and will bring it to him in the evenings to return call to CSW. CSW attempted to reach patient's sister who is listed in emergency contacts although she states she has no contact with her brothers and stated that the other brother listed in emergency contact is "in jail I think". CSW will continue to attempt contact with patient via the neighbor to pursue [facility] as a possible options for transportation. Lasandra BeechJackie Greogry Goodwyn, LCSW, CCSW-MCS [phone]

## 2018-01-16 ENCOUNTER — Telehealth (HOSPITAL_COMMUNITY): Payer: Self-pay | Admitting: Surgery

## 2018-01-16 ENCOUNTER — Ambulatory Visit: Payer: Self-pay | Admitting: Cardiothoracic Surgery

## 2018-01-16 ENCOUNTER — Telehealth: Payer: Self-pay | Admitting: Licensed Clinical Social Worker

## 2018-01-16 MED ORDER — ASPIRIN ADULT LOW STRENGTH 81 MG PO TBEC: 81.0000 mg | DELAYED_RELEASE_TABLET | Freq: Every day | ORAL | 3 refills | Status: DC

## 2018-01-16 MED ORDER — ATORVASTATIN CALCIUM 80 MG PO TABS: 80.0000 mg | ORAL_TABLET | Freq: Every day | ORAL | 3 refills | Status: DC

## 2018-01-16 MED ORDER — FUROSEMIDE 20 MG PO TABS: 20.0000 mg | ORAL_TABLET | ORAL | 3 refills | Status: DC | PRN

## 2018-01-16 MED ORDER — SPIRONOLACTONE 25 MG PO TABS: 12.5000 mg | ORAL_TABLET | Freq: Every day | ORAL | 3 refills | Status: DC

## 2018-01-16 MED ORDER — LOSARTAN POTASSIUM 25 MG PO TABS: 25.0000 mg | ORAL_TABLET | Freq: Every day | ORAL | 3 refills | Status: DC

## 2018-01-16 MED ORDER — AMIODARONE HCL 200 MG PO TABS: 200.0000 mg | ORAL_TABLET | Freq: Every day | ORAL | 0 refills | Status: DC

## 2018-01-16 MED ORDER — CARVEDILOL 3.125 MG PO TABS: 3.1250 mg | ORAL_TABLET | Freq: Two times a day (BID) | ORAL | 3 refills | Status: DC

## 2018-01-16 MED FILL — ATORVASTATIN 80 MG TABLET: 80 | 30 days supply | Qty: 30 | Fill #0

## 2018-01-16 MED FILL — SPIRONOLACTONE 25 MG TABLET: 25 | 34 days supply | Qty: 17 | Fill #0

## 2018-01-16 MED FILL — CARVEDILOL 3.125 MG TABLET: 3.125 | 30 days supply | Qty: 60 | Fill #0

## 2018-01-16 MED FILL — FUROSEMIDE 20 MG TABS: 20 | 30 days supply | Qty: 90 | Fill #0

## 2018-01-16 MED FILL — AMIODARONE HCL 200 MG TAB: 200 | 30 days supply | Qty: 30 | Fill #0

## 2018-01-16 MED FILL — ASPIRIN ADULT LOW STRENGTH: 81 | 30 days supply | Qty: 30 | Fill #0

## 2018-01-16 NOTE — Telephone Encounter (Signed)
CSW spoke with patient and provided a Tracphone with 200 minutes as he has no means of communication. [place] and as a result [place] not meet eligibility for [place] transport services. CSW provided information and referred him to [facility] which [place] on Tuesdays between 9a and 12 noon. CSW assisted by Driscilla Moatsaphne Wood were able to arrange all upcoming appointments for Tuesdays mornings and scheduled through [facility].  Patient reports he lives in a trailer with his brother Judie GrieveBryan who works in the home site in [employer] for rent. Patient has food stamps and reports he has a pending [employer] app which is in review. Patient has his medications through the HF fund and Driscilla MoatsDaphne Wood will follow up on needed refills until he can make his appointments in the HF clinic. CSW will continue to follow and address needs as they arise. CSW will meet with patient on next clinic visit and follow up with [facility] for transport needs. Lasandra BeechJackie Camille Thau, LCSW, CCSW-MCS [phone]

## 2018-01-16 NOTE — Telephone Encounter (Signed)
[name] called to say that he is almost out of all medications.  I have sent all medications to [facility] to be filled through the HF Fund.  The HF Community Paramedic will take all medications to patient's home tomorrow so that patient does not go without medications.

## 2018-01-17 ENCOUNTER — Telehealth: Payer: Self-pay | Admitting: Licensed Clinical Social Worker

## 2018-01-17 NOTE — Telephone Encounter (Signed)
CSW contacted patient to inform of rescheduled appointment at [facility] due to transport needs. Patient informed appointment is on 01/29/18 at 9:30am and arrangements made with [facility] to transport to [facility]. Patient verbalizes understanding and will call if further questions. Lasandra BeechJackie Netha Dafoe, LCSW, CCSW-MCS [phone]

## 2018-01-21 ENCOUNTER — Encounter (HOSPITAL_COMMUNITY): Payer: Self-pay | Admitting: *Deleted

## 2018-01-21 NOTE — Progress Notes (Signed)
Received medical records request from [facility] on 01/11/2018.  CASE #[id number]  Requested records faxed today to [phone].  Original request will be scanned to patient's electronic medical record.

## 2018-01-22 ENCOUNTER — Ambulatory Visit: Payer: Self-pay

## 2018-01-24 ENCOUNTER — Ambulatory Visit (HOSPITAL_COMMUNITY): Payer: Medicaid Other

## 2018-01-25 ENCOUNTER — Other Ambulatory Visit: Payer: Self-pay | Admitting: Cardiothoracic Surgery

## 2018-01-25 DIAGNOSIS — Z951 Presence of aortocoronary bypass graft: Secondary | ICD-10-CM

## 2018-01-28 ENCOUNTER — Ambulatory Visit: Payer: Self-pay

## 2018-01-28 ENCOUNTER — Telehealth: Payer: Self-pay | Admitting: Licensed Clinical Social Worker

## 2018-01-28 NOTE — Telephone Encounter (Signed)
CSW spoke with patient via phone to confirm transportation plans for tomorrow's appointment at [facility]. Patient verbalizes understanding of plan and need for $10 payment to [facility]. CSW continues to follow for transportation and disability follow up. Lasandra BeechJackie Truman Aceituno, LCSW, CCSW-MCS [phone]

## 2018-01-29 ENCOUNTER — Ambulatory Visit: Payer: Self-pay

## 2018-01-29 ENCOUNTER — Encounter: Payer: Self-pay | Admitting: Licensed Clinical Social Worker

## 2018-01-29 ENCOUNTER — Ambulatory Visit (INDEPENDENT_AMBULATORY_CARE_PROVIDER_SITE_OTHER): Payer: Self-pay | Admitting: Physician Assistant

## 2018-01-29 ENCOUNTER — Other Ambulatory Visit: Payer: Self-pay

## 2018-01-29 ENCOUNTER — Ambulatory Visit
Admission: RE | Admit: 2018-01-29 | Discharge: 2018-01-29 | Disposition: A | Discharge status: Home / Self Care | Payer: Medicaid Other | Source: Ambulatory Visit | Attending: Cardiothoracic Surgery | Admitting: Cardiothoracic Surgery

## 2018-01-29 VITALS — BP 117/83 | HR 79 | Resp 18 | Ht 70.0 in | Wt 147.8 lb

## 2018-01-29 DIAGNOSIS — Z951 Presence of aortocoronary bypass graft: Secondary | ICD-10-CM

## 2018-01-29 DIAGNOSIS — I2510 Atherosclerotic heart disease of native coronary artery without angina pectoris: Secondary | ICD-10-CM

## 2018-01-29 NOTE — Progress Notes (Signed)
HPI: Patient returns for routine postoperative follow-up having undergone CABG x 4 on 12/07/2017.  He presented with a  V. Fib arrest on 11/29/2018, requiring balloon pump placement.  He was medically treated by the AHF team and continues follow up with them currently.  The patient's early postoperative recovery while in the hospital was as expected.  Since hospital discharge the patient has done pretty well.  He continues to have some chest "tightness in his left upper chest area.  He continues to have some exertional shortness of breath.  He has been ambulating several times per day.  He states his weight has been stable and he has been monitored by the heart failure clinic.  His appetite has improved.   Current Outpatient Medications  Medication Sig Dispense Refill  . amiodarone (PACERONE) 200 MG tablet Take 1 tablet (200 mg total) by mouth daily. 30 tablet 0  . ASPIRIN ADULT LOW STRENGTH 81 MG EC tablet Take 1 tablet (81 mg total) by mouth daily. 30 tablet 3  . atorvastatin (LIPITOR) 80 MG tablet Take 1 tablet (80 mg total) by mouth daily at 6 PM. 30 tablet 3  . carvedilol (COREG) 3.125 MG tablet Take 1 tablet (3.125 mg total) by mouth 2 (two) times daily with a meal. 60 tablet 3  . furosemide (LASIX) 20 MG tablet Take 1 tablet (20 mg total) by mouth as needed. 90 tablet 3  . losartan (COZAAR) 25 MG tablet Take 1 tablet (25 mg total) by mouth daily. 30 tablet 3  . spironolactone (ALDACTONE) 25 MG tablet Take 0.5 tablets (12.5 mg total) by mouth daily. 30 tablet 3   No current facility-administered medications for this visit.     Physical Exam:  BP 117/83 (BP Location: Right Arm, Patient Position: Sitting, Cuff Size: Large)   Pulse 79   Resp 18   Ht 5\' 10"  (1.778 m)   Wt 147 lb 12.8 oz (67 kg)   SpO2 98% Comment: RA  BMI 21.21 kg/m    Gen: no apparent distress Heart: RRR Lungs: CTA bilaterally Ext: minimal edema present Incisions: clean and dry  Diagnostic Tests:  CXR:  improvement of pleural effusion, sternal wires remain intact   A/P: 1. Chronic Systolic CHF- class III symptoms, weight is stable, following with AHF clinic 2. CV- NSR, BP well controlled 3. Pleural effusion, resolved 4. Dispo- patient doing well, continue close follow up with AHF clinic, will return to clinic in 6 months for follow up  Lowella DandyErin Barrett, PA-C [facility] [phone]

## 2018-01-29 NOTE — Progress Notes (Signed)
CSW met with patient at [place] office. Patient was successful with first transportation experience with [facility] and grateful for the assistance. Patient had paperwork for [place] program needing completion. Patient also shared [employer] and possible disconnection at the end of the month if not paid. CSW assisted with paperwork and financial assistance for minium payment for [facility] bill to avoid any interruption in service. Patient verbalizes understanding of follow up visits next week and transport arranged through [place]. CSW will continue to follow for support and resources. Raquel Sarna, [place], [place]

## 2018-02-04 ENCOUNTER — Ambulatory Visit: Payer: Self-pay

## 2018-02-05 ENCOUNTER — Telehealth: Payer: Self-pay | Admitting: Licensed Clinical Social Worker

## 2018-02-05 ENCOUNTER — Ambulatory Visit (HOSPITAL_COMMUNITY): Payer: Medicaid Other

## 2018-02-05 NOTE — Telephone Encounter (Signed)
Patient's appointment for today had to be rescheduled and CSW contacted [facility] to confirm transport for next week's appointment. Patient aware and verbalizes understanding of follow up. CSW will meet patient next week during clinic visit. Lasandra BeechJackie Mishawn Hemann, LCSW, CCSW-MCS [phone]

## 2018-02-11 ENCOUNTER — Ambulatory Visit (HOSPITAL_COMMUNITY): Payer: Medicaid Other

## 2018-02-12 ENCOUNTER — Ambulatory Visit (HOSPITAL_COMMUNITY)
Admission: RE | Admit: 2018-02-12 | Discharge: 2018-02-12 | Disposition: A | Discharge status: Home / Self Care | Payer: Medicaid Other | Source: Ambulatory Visit | Attending: Cardiology | Admitting: Cardiology

## 2018-02-12 ENCOUNTER — Telehealth (HOSPITAL_COMMUNITY): Payer: Self-pay | Admitting: Surgery

## 2018-02-12 ENCOUNTER — Encounter (HOSPITAL_COMMUNITY): Payer: Self-pay

## 2018-02-12 VITALS — BP 128/98 | HR 70 | Wt 153.8 lb

## 2018-02-12 DIAGNOSIS — I5022 Chronic systolic (congestive) heart failure: Secondary | ICD-10-CM | POA: Insufficient documentation

## 2018-02-12 DIAGNOSIS — Z951 Presence of aortocoronary bypass graft: Secondary | ICD-10-CM

## 2018-02-12 DIAGNOSIS — I110 Hypertensive heart disease with heart failure: Secondary | ICD-10-CM | POA: Insufficient documentation

## 2018-02-12 DIAGNOSIS — R55 Syncope and collapse: Secondary | ICD-10-CM | POA: Insufficient documentation

## 2018-02-12 DIAGNOSIS — Z8674 Personal history of sudden cardiac arrest: Secondary | ICD-10-CM | POA: Diagnosis not present

## 2018-02-12 DIAGNOSIS — K219 Gastro-esophageal reflux disease without esophagitis: Secondary | ICD-10-CM | POA: Diagnosis not present

## 2018-02-12 DIAGNOSIS — Z79899 Other long term (current) drug therapy: Secondary | ICD-10-CM | POA: Insufficient documentation

## 2018-02-12 DIAGNOSIS — E7800 Pure hypercholesterolemia, unspecified: Secondary | ICD-10-CM | POA: Diagnosis not present

## 2018-02-12 DIAGNOSIS — I2510 Atherosclerotic heart disease of native coronary artery without angina pectoris: Secondary | ICD-10-CM | POA: Diagnosis not present

## 2018-02-12 DIAGNOSIS — Z7982 Long term (current) use of aspirin: Secondary | ICD-10-CM | POA: Diagnosis not present

## 2018-02-12 DIAGNOSIS — N179 Acute kidney failure, unspecified: Secondary | ICD-10-CM | POA: Diagnosis not present

## 2018-02-12 LAB — BASIC METABOLIC PANEL
Anion gap: 12 (ref 5–15)
BUN: 11 mg/dL (ref 6–20)
CO2: 23 mmol/L (ref 22–32)
CREATININE: 1.16 mg/dL (ref 0.61–1.24)
Calcium: 9 mg/dL (ref 8.9–10.3)
Chloride: 102 mmol/L (ref 101–111)
GFR calc Af Amer: >60 mL/min (ref >60)
GLUCOSE: 103 mg/dL — AB (ref 65–99)
Potassium: 4.2 mmol/L (ref 3.5–5.1)
Sodium: 137 mmol/L (ref 135–145)

## 2018-02-12 MED ORDER — ATORVASTATIN CALCIUM 80 MG PO TABS: 80.0000 mg | ORAL_TABLET | Freq: Every day | ORAL | 3 refills | Status: DC

## 2018-02-12 MED ORDER — SPIRONOLACTONE 25 MG PO TABS: 25.0000 mg | ORAL_TABLET | Freq: Every day | ORAL | 11 refills | Status: DC

## 2018-02-12 MED ORDER — LOSARTAN POTASSIUM 25 MG PO TABS: 25.0000 mg | ORAL_TABLET | Freq: Every day | ORAL | 3 refills | Status: DC

## 2018-02-12 MED ORDER — ASPIRIN ADULT LOW STRENGTH 81 MG PO TBEC: 81.0000 mg | DELAYED_RELEASE_TABLET | Freq: Every day | ORAL | 3 refills | Status: DC

## 2018-02-12 MED ORDER — FUROSEMIDE 20 MG PO TABS: 20.0000 mg | ORAL_TABLET | ORAL | 3 refills | Status: DC | PRN

## 2018-02-12 MED ORDER — CARVEDILOL 3.125 MG PO TABS: 3.1250 mg | ORAL_TABLET | Freq: Two times a day (BID) | ORAL | 3 refills | Status: DC

## 2018-02-12 MED FILL — SPIRONOLACTONE 25 MG TABLET: 25 | 30 days supply | Qty: 30 | Fill #0

## 2018-02-12 MED FILL — LOSARTAN POTASSIUM 25 MG TA: 25 | 30 days supply | Qty: 30 | Fill #0

## 2018-02-12 NOTE — Progress Notes (Signed)
Advanced Heart Failure Clinic Note   Primary Cardiologist: Dr. Gala Romney   HPI:  [name] is a 57 y.o. male with h/o ETOH, VF Arrest, CAD s/p CABG 12/07/17, and ICM with EF 25-30%.  Admitted 12/6 - 12/14/17 with V-Fib arrest while chopping wood. Rrceived CPR in the field. R/LHC with severe 3vD as below and cardiogenic shock requiring IABP. Required milrinone with continued shock. Pt improved and IABP removed 12/10, but pt had recurrent VF arrest so IABP replaced. Stabilized and underwent CABG 12/07/17. Tolerated gradual wean of meds and extubation with no furthe events. HF medications titrated as tolerated.  Admitted 12/27 -> 12/28 with syncope and AKI thought to be 2/2 volume depletion on lasix, spiro, and Entresto. CT negative for ICH.   Improved rapidly with holding meds and gentle IVF. Entresto stopped. Spiro cut back and switched to low dose losartan.   He presents today for regular follow up. Not a great historian. Continues to feel better overall. No longer SOB changing clothes. Walks several blocks around his neighborhood only taking occasional breaks. He continues to have intermittent lightheadedness with rapid standing. Denies exertional CP. Continues to have positional soreness and stiffness at surgical sites. Occasional has mild peripheral edema, R>L. No further near syncope. Taking all medications as directed. Doesn't weigh daily at home.   Review of systems complete and found to be negative unless listed in HPI.    TTE 11/30/17 25-30% RV normal.  TTE 12/04/2017 LVEF 40-45% (On IABP) TEE 12/07/17 LVEF 45-50% (On IABP)  LHC 11/29/17 Left Main  Dist LM lesion 40% stenosed  Left Anterior Descending  Prox LAD-1 lesion is 60% stenosed. The lesion is calcified.  Prox LAD-2 lesion 80% stenosed  First Diagonal Branch  Vessel is small in size.  Ost 1st Diag to 1st Diag lesion 70% stenosed  Second Diagonal Branch  Vessel is small in size. There is mild disease in the  vessel.  Left Circumflex  Ost Cx lesion 30% stenosed  Prox Cx lesion is 80% stenosed. The lesion is calcified.  Prox Cx to Mid Cx lesion 80% stenosed  First Obtuse Marginal Branch  Vessel is small in size.  Ost 1st Mrg lesion 90% stenosed  Second Obtuse Marginal Branch  Ost 2nd Mrg to 2nd Mrg lesion 60% stenosed  Right Coronary Artery  Collaterals  Mid RCA filled by collaterals from Prox RCA.    Prox RCA to Mid RCA lesion is 100% stenosed. The lesion is chronically occluded with bridging and left-to-right collateral flow.  Right Posterior Descending Artery  Collaterals  RPDA filled by collaterals from 2nd Sept.   Past Medical History:  Diagnosis Date  . Arthritis    "hands; maybe my toes" (12/20/2017)  . Cardiac arrest (HCC) 11/29/2017   hx VF arrest/notes 12/20/2017  . Coronary artery disease   . GERD (gastroesophageal reflux disease)   . H/O ETOH abuse    /notes 12/20/2017  . High cholesterol   . Hypertension   . Seizures (HCC) ~ 2016; ~ 2017   "I've had a couple; I was at work" (12/20/2017)  . Syncope and collapse    "last few days" (12/20/2017)   Current Outpatient Medications  Medication Sig Dispense Refill  . amiodarone (PACERONE) 200 MG tablet Take 1 tablet (200 mg total) by mouth daily. 30 tablet 0  . ASPIRIN ADULT LOW STRENGTH 81 MG EC tablet Take 1 tablet (81 mg total) by mouth daily. 30 tablet 3  . atorvastatin (LIPITOR) 80 MG tablet Take 1  tablet (80 mg total) by mouth daily at 6 PM. 30 tablet 3  . carvedilol (COREG) 3.125 MG tablet Take 1 tablet (3.125 mg total) by mouth 2 (two) times daily with a meal. 60 tablet 3  . furosemide (LASIX) 20 MG tablet Take 1 tablet (20 mg total) by mouth as needed. 90 tablet 3  . losartan (COZAAR) 25 MG tablet Take 1 tablet (25 mg total) by mouth daily. 30 tablet 3  . spironolactone (ALDACTONE) 25 MG tablet Take 0.5 tablets (12.5 mg total) by mouth daily. 30 tablet 3   No current facility-administered medications for this  encounter.    No Known Allergies   Social History   Socioeconomic History  . Marital status: Divorced    Spouse name: Not on file  . Number of children: Not on file  . Years of education: Not on file  . Highest education level: Not on file  Social Needs  . Financial resource strain: Not on file  . Food insecurity - worry: Not on file  . Food insecurity - inability: Not on file  . Transportation needs - medical: Not on file  . Transportation needs - non-medical: Not on file  Occupational History  . Not on file  Tobacco Use  . Smoking status: Never Smoker  . Smokeless tobacco: Never Used  Substance and Sexual Activity  . Alcohol use: Yes    Comment: 12/20/2017 "I quit drinking after I had the heart attack"  . Drug use: No  . Sexual activity: Not Currently  Other Topics Concern  . Not on file  Social History Narrative  . Not on file   Family History  Problem Relation Age of Onset  . Heart attack Other    Vitals:   02/12/18 0914  BP: (!) 128/98  Pulse: 70  SpO2: 95%  Weight: 153 lb 12.8 oz (69.8 kg)   Orthostatics Sitting SBP 132 by palp Standing SBP 126 by palp  Wt Readings from Last 3 Encounters:  02/12/18 153 lb 12.8 oz (69.8 kg)  01/29/18 147 lb 12.8 oz (67 kg)  12/27/17 153 lb 2 oz (69.5 kg)    PHYSICAL EXAM: General: Well appearing. No resp difficulty. HEENT: Normal Neck: Supple. JVP 5-6. Carotids 2+ bilat; no bruits. No thyromegaly or nodule noted. Cor: PMI nondisplaced. RRR, No M/G/R noted Lungs: CTAB, normal effort. Abdomen: Soft, non-tender, non-distended, no HSM. No bruits or masses. +BS  Extremities: No cyanosis, clubbing, or rash. R and LLE no edema.  Neuro: Alert & orientedx3, cranial nerves grossly intact. moves all 4 extremities w/o difficulty. Affect pleasant   ASSESSMENT & PLAN:  1. Chronic systolic CHF with ICM - NYHA II-III symptoms - Volume status stable on exam.  - Not on lasix currently - Continue losartan 25 mg daily. Has  previously failed Entresto with orthostasis, syncope, and AKI - Increase spiro 25 mg qhs. BMET today and 10 days.  - Continue coreg 3.125 mg BID for now.  - Reinforced fluid restriction to < 2 L daily, sodium restriction to less than 2000 mg daily, and the importance of daily weights.    2. CAD s/p CABG 12/07/2017 - No s/s of ischemia.     - Continue atorvastatin 80 mg daily - Continue ASA 81 mg daily.    3. Recent AKI - BMET today.   4. VF arrest in setting of STEMI - Stop amiodarone. No further  Meds as above. BMET today and repeat 10 days. RTC 4 weeks.   Mariam DollarMichael Andrew  Tillery, PA-C 02/12/18   Greater than 50% of the 25 minute visit was spent in counseling/coordination of care regarding disease state education, salt/fluid restriction, sliding scale diuretics, and medication compliance.

## 2018-02-12 NOTE — Progress Notes (Signed)
HF MD: Leory PlowmanBENISMHON  HPI:  [name] is a 57 y.o. Caucasian male with h/o ETOH, VF Arrest, CAD s/p CABG 12/07/17, and ICM with EF 25-30%.  Admitted 12/6 - 12/14/17 with V-Fib arrest while chopping wood. Rrceived CPR in the field. R/LHC with severe 3vD as below and cardiogenic shock requiring IABP. Required milrinone with continued shock. Pt improved and IABP removed 12/10, but pt had recurrent VF arrest so IABP replaced. Stabilized and underwent CABG 12/07/17. Tolerated gradual wean of meds and extubation with no furthe events. HF medications titrated as tolerated.  Admitted 12/27 -> 12/28 with syncope and AKI thought to be 2/2 volume depletion on lasix, spiro, and Entresto. CT negative for ICH. Improved rapidly with holding meds and gentle IVF. Entresto stopped. Spiro cut back and switched to low dose losartan.   Pt presents today for pharmacist medication reconciliation and PA medication titration. At last HF clinic visit on 12/27/17, his losartan was increased to 25 mg daily and furosemide was added PRN for weight gain or swelling.  Not a great historian and does not have good understanding of his regimen. Feeling OK overall. Remains tired and with low energy level. Remains mildly lightheaded with rapid standing, but no further syncopal episodes. Denies exertional CP.    Marland Kitchen. Shortness of breath/dyspnea on exertion? Yes - stable  . Orthopnea/PND? no . Edema? no . Lightheadedness/dizziness? yes . Daily weights at home? yes . Blood pressure/heart rate monitoring at home? no . Following low-sodium/fluid-restricted diet? no  HF Medications: Carvedilol 3.125 mg PO BID Furosemide 20 mg PO PRN weight gain/SOB - none recently  Losartan 25 mg PO daily Spironolactone 12.5 mg PO daily   Has the patient been experiencing any side effects to the medications prescribed?  no  Does the patient have any problems obtaining medications due to transportation or finances?   Yes - [place] [employer] FAMILY  PLANNING; using HF fund currently  Understanding of regimen: poor Understanding of indications: poor Potential of compliance: good Patient understands to avoid NSAIDs. Patient understands to avoid decongestants.    Pertinent Lab Values: . 02/12/18: Serum creatinine 1.16, BUN 11, Potassium 4.2, Sodium 137  Vital Signs: . Weight: 153 lb (dry weight: 147-150 lb) . Blood pressure: 128/98 mmHg  . Heart rate: 70 bpm   Assessment: 1. 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 NYHA class IIIsymptoms.  - Reports great compliance with his medications, continues to utilize HF fund  - Per discussion with Mardelle MatteAndy, PA-C will increase spironolactone to 25 mg daily   - Continue carvedilol 3.125 mg BID, furosemide 20 mg PRN and losartan 25 mg daily  - Basic disease state pathophysiology, medication indication, mechanism and side effects reviewed at length with patient and he verbalized understanding  Plan: 1) Medication changes: Based on clinical presentation, vital signs and recent labs will increase spironolactone to 25 mg daily  2) Labs: BMET today and in 10 days  3) Follow-up: 4 weeks with NP/PA    Cicero DuckErika K. Bonnye FavaNicolsen, PharmD, BCPS, CPP Clinical Pharmacist Pager: [phone] Phone: [phone] 02/12/2018 9:08 AM

## 2018-02-12 NOTE — Progress Notes (Signed)
Heart Failure Clinic Social Work Assessment  [name]  presents today in association with an Advanced Heart Failure Clinic Appointment.  Patient seen today by CSW for follow up/assistance on Financial difficulties Health problems and/or  Finances and Transportation   Social Determinants impacting successful heart failure regimen:  Housing: patient lives with brother. Food: Do you have enough food? Yes  Patient receives food stamps. Do you know and understand healthy eating and how that affects your heart failure diagnosis? Yes  Do you follow a low salt diet?  Yes  Utilities: Do you have gas and/or electricity on in your home? Yes  Income: What is your source of income? Pending SSI Insurance: pending SSI Transportation: Do you have transportation to your medical appointments?Yes  If yes, how? [facility]- all appointments must be scheduled for Tuesday mornings.    Daily Health Needs: Do you have a scale and weigh each day?  Yes  Do you have your medications? Yes supplied by HF fund Are you able to adhere to your medication regimen? Yes   Do you ever take medications differently than prescribed? No  Do you know the zones of Heart Failure?  Yes  Do you know how to contact the HF Clinic appropriately with worsening symptoms or weight increases?  Yes    Do you have an Advanced Directive?  No  If not, would you like to complete?   Do you have any identified obstacles / challenges for adherence to current treatment plan?Yes  finances and transportation  Patient appears to be coping well under stressful conditions and lack of financial resources. Patient grateful for assistance and motivated for improved health.     CSW assisted patient with [employer], Supportive Counseling and follow up call to [facility] to inquire about pending application with the goal to Increase healthy adjustment to current life circumstances, Increase adequate support systems for patient/family and obtain  benefits for health insurance and income.   Heart Failure Clinic Social Worker will continue to coordinate and monitor patient's treatment plan as needed.  CSW continues to be available for identified needs.Jose BeechJackie Briahnna Harries, LCSW, CCSW-MCS [phone]

## 2018-02-12 NOTE — Telephone Encounter (Signed)
Patient's medications sent to [facility] for refill through the HF Fund.  Delle ReiningZachary Shelton -HF Community Paramedic will pick up and deliver medications to patient today.

## 2018-02-12 NOTE — Addendum Note (Signed)
Encounter addended by: Marcy SirenBrennan, Claudia Greenley S, LCSW on: 02/12/2018 4:34 PM  Actions taken: Sign clinical note

## 2018-02-12 NOTE — Patient Instructions (Signed)
STOP Amiodarone.  INCREASE Spironolactone to 25 mg (1 whole tablet) once daily.  Routine lab work today. Will notify you of abnormal results, otherwise no news is good news!  Return in 1-2 weeks to repeat labs.  Follow up 4 weeks with Otilio SaberAndy Tillery PA-C.  Follow up 2 months with echocardiogram and appointment with Dr. [name].  Take all medication as prescribed the day of your appointment. Bring all medications with you to your appointment.  Do the following things EVERYDAY: 1) Weigh yourself in the morning before breakfast. Write it down and keep it in a log. 2) Take your medicines as prescribed 3) Eat low salt foods-Limit salt (sodium) to 2000 mg per day.  4) Stay as active as you can everyday 5) Limit all fluids for the day to less than 2 liters

## 2018-02-13 ENCOUNTER — Telehealth: Payer: Self-pay | Admitting: Licensed Clinical Social Worker

## 2018-02-13 NOTE — Telephone Encounter (Signed)
CSW received return call from [facility] to schedule transport for upcoming HF clinic appointments. Dates provided and confirmed availability. Patient all set for transport. CSW unable to confirm with patient as his cell phone is out of minutes. CSW will follow to assist with cell minutes. Lasandra BeechJackie Abdurahman Rugg, LCSW, CCSW-MCS [phone]

## 2018-02-13 NOTE — Telephone Encounter (Signed)
CSW contacted [facility] to arrange transportation for the next two upcoming HF clinic appointments. Message left for return call. Lasandra BeechJackie Aceson Labell, LCSW, CCSW-MCS [phone]

## 2018-02-14 ENCOUNTER — Encounter (HOSPITAL_COMMUNITY): Payer: Medicaid Other

## 2018-02-26 ENCOUNTER — Ambulatory Visit (HOSPITAL_COMMUNITY)
Admission: RE | Admit: 2018-02-26 | Discharge: 2018-02-26 | Disposition: A | Discharge status: Home / Self Care | Payer: Medicaid Other | Source: Ambulatory Visit | Attending: Cardiology | Admitting: Cardiology

## 2018-02-26 DIAGNOSIS — I5022 Chronic systolic (congestive) heart failure: Secondary | ICD-10-CM | POA: Insufficient documentation

## 2018-02-26 LAB — BASIC METABOLIC PANEL
Anion gap: 8 (ref 5–15)
BUN: 9 mg/dL (ref 6–20)
CHLORIDE: 104 mmol/L (ref 101–111)
CO2: 27 mmol/L (ref 22–32)
Calcium: 9.4 mg/dL (ref 8.9–10.3)
Creatinine, Ser: 1.11 mg/dL (ref 0.61–1.24)
GFR calc Af Amer: >60 mL/min (ref >60)
GFR calc non Af Amer: >60 mL/min (ref >60)
GLUCOSE: 99 mg/dL (ref 65–99)
POTASSIUM: 4.8 mmol/L (ref 3.5–5.1)
SODIUM: 139 mmol/L (ref 135–145)

## 2018-02-26 MED FILL — CARVEDILOL 3.125 MG TABLET: 3.125 | 30 days supply | Qty: 60 | Fill #0

## 2018-02-26 MED FILL — ASPIRIN ADULT LOW STRENGTH: 81 | 30 days supply | Qty: 30 | Fill #0

## 2018-02-26 MED FILL — LOSARTAN POTASSIUM 25 MG TA: 25 | 30 days supply | Qty: 30 | Fill #0

## 2018-02-26 MED FILL — FUROSEMIDE 20 MG TABS: 20 | 30 days supply | Qty: 30 | Fill #0

## 2018-02-26 MED FILL — SPIRONOLACTONE 25 MG TABLET: 25 | 30 days supply | Qty: 30 | Fill #0

## 2018-02-26 MED FILL — ATORVASTATIN 80 MG TABLET: 80 | 30 days supply | Qty: 30 | Fill #0

## 2018-02-26 NOTE — Progress Notes (Signed)
CSW met with patient in the clinic. Patient arrived in clinic via [facility] and assisted with payment of $10 by patient assistance fund. Patient reports he has pending SSI application but has not received any notification of additional items or a determination. Patient and CSW contacted Disability Determinations to inquire about case status. CSW informed that patient's case is complete and in review. Disability worker unable to give an estimated time of determination. Patient verbalizes understanding and to keep a look out in the mail for further information related to disability approval. Patient states current minutes on his phone but anticipates running out. CSW encouraged patient to call if needed but to try and maintain service as this is the only means of communication for medical appointments. Patient verbalizes understanding. CSW will continue to follow for support and care coordination. Raquel Sarna, [place], [place]

## 2018-02-26 NOTE — Addendum Note (Signed)
Encounter addended by: Marcy SirenBrennan, Evelynne Spiers S, LCSW on: 02/26/2018 12:16 PM  Actions taken: Sign clinical note

## 2018-03-12 ENCOUNTER — Ambulatory Visit (HOSPITAL_COMMUNITY)
Admission: RE | Admit: 2018-03-12 | Discharge: 2018-03-12 | Disposition: A | Discharge status: Home / Self Care | Payer: Medicaid Other | Source: Ambulatory Visit | Attending: Cardiology | Admitting: Cardiology

## 2018-03-12 ENCOUNTER — Encounter (HOSPITAL_COMMUNITY): Payer: Self-pay

## 2018-03-12 VITALS — BP 122/86 | HR 74 | Wt 156.4 lb

## 2018-03-12 DIAGNOSIS — I2510 Atherosclerotic heart disease of native coronary artery without angina pectoris: Secondary | ICD-10-CM

## 2018-03-12 DIAGNOSIS — Z8674 Personal history of sudden cardiac arrest: Secondary | ICD-10-CM | POA: Diagnosis not present

## 2018-03-12 DIAGNOSIS — I110 Hypertensive heart disease with heart failure: Secondary | ICD-10-CM | POA: Diagnosis present

## 2018-03-12 DIAGNOSIS — Z951 Presence of aortocoronary bypass graft: Secondary | ICD-10-CM | POA: Diagnosis not present

## 2018-03-12 DIAGNOSIS — I5022 Chronic systolic (congestive) heart failure: Secondary | ICD-10-CM | POA: Diagnosis not present

## 2018-03-12 DIAGNOSIS — K219 Gastro-esophageal reflux disease without esophagitis: Secondary | ICD-10-CM | POA: Insufficient documentation

## 2018-03-12 DIAGNOSIS — Z79899 Other long term (current) drug therapy: Secondary | ICD-10-CM | POA: Diagnosis not present

## 2018-03-12 DIAGNOSIS — E7800 Pure hypercholesterolemia, unspecified: Secondary | ICD-10-CM | POA: Diagnosis not present

## 2018-03-12 DIAGNOSIS — N179 Acute kidney failure, unspecified: Secondary | ICD-10-CM

## 2018-03-12 DIAGNOSIS — Z7982 Long term (current) use of aspirin: Secondary | ICD-10-CM | POA: Insufficient documentation

## 2018-03-12 LAB — BASIC METABOLIC PANEL
ANION GAP: 11 (ref 5–15)
BUN: 7 mg/dL (ref 6–20)
CO2: 23 mmol/L (ref 22–32)
Calcium: 9.1 mg/dL (ref 8.9–10.3)
Chloride: 105 mmol/L (ref 101–111)
Creatinine, Ser: 1 mg/dL (ref 0.61–1.24)
GFR calc non Af Amer: >60 mL/min (ref >60)
Glucose, Bld: 102 mg/dL — ABNORMAL HIGH (ref 65–99)
POTASSIUM: 4.4 mmol/L (ref 3.5–5.1)
Sodium: 139 mmol/L (ref 135–145)

## 2018-03-12 MED ORDER — CARVEDILOL 6.25 MG PO TABS: 6.2500 mg | ORAL_TABLET | Freq: Two times a day (BID) | ORAL | 11 refills | Status: DC

## 2018-03-12 MED FILL — CARVEDILOL 6.25 MG TABLET: 6.25 | 30 days supply | Qty: 60 | Fill #0

## 2018-03-12 NOTE — Patient Instructions (Signed)
INCREASE Carvedilol (Coreg) to 6.25 mg twice daily. Can double up on your current 3.125 mg tablets twice daily (Take 2 tabs twice daily). New Rx has been sent to your pharmacy for 6.25 mg tablets (Take 1 tab twice daily). [facility] under the Heart Failure Fund. Address: [address], [place], [place] [address]  Phone: [phone] [facility]ated [facility].  Routine lab work today. Will notify you of abnormal results, otherwise no news is good news!  Follow up as scheduled.  Take all medication as prescribed the day of your appointment. Bring all medications with you to your appointment.  Do the following things EVERYDAY: 1) Weigh yourself in the morning before breakfast. Write it down and keep it in a log. 2) Take your medicines as prescribed 3) Eat low salt foods-Limit salt (sodium) to 2000 mg per day.  4) Stay as active as you can everyday 5) Limit all fluids for the day to less than 2 liters

## 2018-03-12 NOTE — Progress Notes (Signed)
Advanced Heart Failure Clinic Note   Primary Cardiologist: Dr. [name]   HPI:  [name] is a 57 y.o. male with h/o ETOH, VF Arrest, CAD s/p CABG 12/07/17, and ICM with EF 25-30%.  Admitted 12/6 - 12/14/17 with V-Fib arrest while chopping wood. Rrceived CPR in the field. R/LHC with severe 3vD as below and cardiogenic shock requiring IABP. Required milrinone with continued shock. Pt improved and IABP removed 12/10, but pt had recurrent VF arrest so IABP replaced. Stabilized and underwent CABG 12/07/17. Tolerated gradual wean of meds and extubation with no furthe events. HF medications titrated as tolerated.  Admitted 12/27 -> 12/28 with syncope and AKI thought to be 2/2 volume depletion on lasix, spiro, and Entresto. CT negative for ICH.   Improved rapidly with holding meds and gentle IVF. Entresto stopped. Spiro cut back and switched to low dose losartan.   He presents today for regular follow up.  He says he is feeling better. Able to walk several blocks now and taking less break. Denies lightheadedness or dizziness. Still having some tightness around sternotomy, but no exertional CP. Denies peripheral edema. Still having intermittent numbness in his R leg. Taking all medications as directed.   Review of systems complete and found to be negative unless listed in HPI.    TTE 11/30/17 25-30% RV normal.  TTE 12/04/2017 LVEF 40-45% (On IABP) TEE 12/07/17 LVEF 45-50% (On IABP)  LHC 11/29/17 Left Main  Dist LM lesion 40% stenosed  Left Anterior Descending  Prox LAD-1 lesion is 60% stenosed. The lesion is calcified.  Prox LAD-2 lesion 80% stenosed  First Diagonal Branch  Vessel is small in size.  Ost 1st Diag to 1st Diag lesion 70% stenosed  Second Diagonal Branch  Vessel is small in size. There is mild disease in the vessel.  Left Circumflex  Ost Cx lesion 30% stenosed  Prox Cx lesion is 80% stenosed. The lesion is calcified.  Prox Cx to Mid Cx lesion 80% stenosed  First  Obtuse Marginal Branch  Vessel is small in size.  Ost 1st Mrg lesion 90% stenosed  Second Obtuse Marginal Branch  Ost 2nd Mrg to 2nd Mrg lesion 60% stenosed  Right Coronary Artery  Collaterals  Mid RCA filled by collaterals from Prox RCA.    Prox RCA to Mid RCA lesion is 100% stenosed. The lesion is chronically occluded with bridging and left-to-right collateral flow.  Right Posterior Descending Artery  Collaterals  RPDA filled by collaterals from 2nd Sept.   Past Medical History:  Diagnosis Date  . Arthritis    "hands; maybe my toes" (12/20/2017)  . Cardiac arrest (HCC) 11/29/2017   hx VF arrest/notes 12/20/2017  . Coronary artery disease   . GERD (gastroesophageal reflux disease)   . H/O ETOH abuse    /notes 12/20/2017  . High cholesterol   . Hypertension   . Seizures (HCC) ~ 2016; ~ 2017   "I've had a couple; I was at work" (12/20/2017)  . Syncope and collapse    "last few days" (12/20/2017)   Current Outpatient Medications  Medication Sig Dispense Refill  . ASPIRIN ADULT LOW STRENGTH 81 MG EC tablet Take 1 tablet (81 mg total) by mouth daily. 30 tablet 3  . atorvastatin (LIPITOR) 80 MG tablet Take 1 tablet (80 mg total) by mouth daily at 6 PM. 30 tablet 3  . carvedilol (COREG) 3.125 MG tablet Take 1 tablet (3.125 mg total) by mouth 2 (two) times daily with a meal. 60 tablet 3  .  furosemide (LASIX) 20 MG tablet Take 1 tablet (20 mg total) by mouth as needed. 90 tablet 3  . losartan (COZAAR) 25 MG tablet Take 1 tablet (25 mg total) by mouth daily. 30 tablet 3  . spironolactone (ALDACTONE) 25 MG tablet Take 1 tablet (25 mg total) by mouth daily. 30 tablet 11   No current facility-administered medications for this encounter.    No Known Allergies   Social History   Socioeconomic History  . Marital status: Divorced    Spouse name: Not on file  . Number of children: Not on file  . Years of education: Not on file  . Highest education level: Not on file  Social Needs    . Financial resource strain: Not on file  . Food insecurity - worry: Not on file  . Food insecurity - inability: Not on file  . Transportation needs - medical: Not on file  . Transportation needs - non-medical: Not on file  Occupational History  . Not on file  Tobacco Use  . Smoking status: Never Smoker  . Smokeless tobacco: Never Used  Substance and Sexual Activity  . Alcohol use: Yes    Comment: 12/20/2017 "I quit drinking after I had the heart attack"  . Drug use: No  . Sexual activity: Not Currently  Other Topics Concern  . Not on file  Social History Narrative  . Not on file   Family History  Problem Relation Age of Onset  . Heart attack Other    Vitals:   03/12/18 0857  BP: 122/86  Pulse: 74  SpO2: 96%  Weight: 156 lb 6.4 oz (70.9 kg)   Wt Readings from Last 3 Encounters:  03/12/18 156 lb 6.4 oz (70.9 kg)  02/12/18 153 lb 12.8 oz (69.8 kg)  01/29/18 147 lb 12.8 oz (67 kg)    PHYSICAL EXAM: General: Well appearing. No resp difficulty. HEENT: Normal Neck: Supple. JVP 5-6. Carotids 2+ bilat; no bruits. No thyromegaly or nodule noted. Cor: PMI nondisplaced. RRR, No M/G/R noted Lungs: CTAB, normal effort. Abdomen: Soft, non-tender, non-distended, no HSM. No bruits or masses. +BS  Extremities: No cyanosis, clubbing, or rash. R and LLE no edema.  Neuro: Alert & orientedx3, cranial nerves grossly intact. moves all 4 extremities w/o difficulty. Affect pleasant   ASSESSMENT & PLAN:  1. Chronic systolic CHF with ICM - TTE 11/30/17 25-30% RV normal.  Plan to repeat at next visit.  - NYHA II-III symptoms - Volume status stable on exam. BMET today. - Not on lasix currently - Continue losartan 25 mg daily. Has previously failed Entresto with orthostasis, syncope, and AKI - Continue spiro 25 mg qhs. BMET today and 10 days.  - Increase coreg to 6.25 mg BID.  - Reinforced fluid restriction to < 2 L daily, sodium restriction to less than 2000 mg daily, and the  importance of daily weights.    2. CAD s/p CABG 12/07/2017 - No s/s of ischemia.    - Continue atorvastatin 80 mg daily - Continue ASA 81 mg daily.    3. Recent AKI - BMET today.   4. VF arrest in setting of STEMI - Off amiodarone. Stable.   Meds and labs as above. Keep appt for 2 weeks with echo.   Graciella Freer, PA-C 03/12/18   Greater than 50% of the 25 minute visit was spent in counseling/coordination of care regarding disease state education, salt/fluid restriction, sliding scale diuretics, and medication compliance.

## 2018-03-13 ENCOUNTER — Telehealth: Payer: Self-pay | Admitting: Licensed Clinical Social Worker

## 2018-03-13 NOTE — Telephone Encounter (Signed)
CSW contacted patient to follow up on yesterday's visit. Patient states all went well and has another follow up in 2 weeks. CSW encouraged patient to make transport arrangements with [facility]. Patient stated he has a cut off notice with [employer] for 03/25/18. CSW assisted with resources for payment of bill to avoid disconnection. Patient grateful for the assistance and will follow up on next visit. CSW available as needed. Lasandra BeechJackie Celesta Funderburk, LCSW, CCSW-MCS [phone]

## 2018-03-19 NOTE — Addendum Note (Signed)
Encounter addended by: Graciella Freerillery, Michael Andrew, PA-C on: 03/19/2018 7:16 AM  Actions taken: LOS modified

## 2018-03-22 DIAGNOSIS — Z736 Limitation of activities due to disability: Secondary | ICD-10-CM

## 2018-03-26 ENCOUNTER — Ambulatory Visit (HOSPITAL_BASED_OUTPATIENT_CLINIC_OR_DEPARTMENT_OTHER)
Admission: RE | Admit: 2018-03-26 | Discharge: 2018-03-26 | Disposition: A | Discharge status: Home / Self Care | Payer: Medicaid Other | Source: Ambulatory Visit | Attending: Internal Medicine | Admitting: Internal Medicine

## 2018-03-26 ENCOUNTER — Ambulatory Visit (HOSPITAL_COMMUNITY)
Admission: RE | Admit: 2018-03-26 | Discharge: 2018-03-26 | Disposition: A | Discharge status: Home / Self Care | Payer: Medicaid Other | Source: Ambulatory Visit | Attending: Student | Admitting: Student

## 2018-03-26 VITALS — BP 126/78 | HR 86 | Wt 158.4 lb

## 2018-03-26 DIAGNOSIS — I429 Cardiomyopathy, unspecified: Secondary | ICD-10-CM | POA: Insufficient documentation

## 2018-03-26 DIAGNOSIS — F1010 Alcohol abuse, uncomplicated: Secondary | ICD-10-CM | POA: Diagnosis not present

## 2018-03-26 DIAGNOSIS — I110 Hypertensive heart disease with heart failure: Secondary | ICD-10-CM | POA: Diagnosis not present

## 2018-03-26 DIAGNOSIS — E785 Hyperlipidemia, unspecified: Secondary | ICD-10-CM | POA: Diagnosis not present

## 2018-03-26 DIAGNOSIS — I5022 Chronic systolic (congestive) heart failure: Secondary | ICD-10-CM | POA: Diagnosis present

## 2018-03-26 DIAGNOSIS — Z951 Presence of aortocoronary bypass graft: Secondary | ICD-10-CM | POA: Diagnosis not present

## 2018-03-26 DIAGNOSIS — I498 Other specified cardiac arrhythmias: Secondary | ICD-10-CM | POA: Diagnosis not present

## 2018-03-26 DIAGNOSIS — I2510 Atherosclerotic heart disease of native coronary artery without angina pectoris: Secondary | ICD-10-CM | POA: Diagnosis not present

## 2018-03-26 DIAGNOSIS — I951 Orthostatic hypotension: Secondary | ICD-10-CM

## 2018-03-26 LAB — BASIC METABOLIC PANEL
Anion gap: 12 (ref 5–15)
BUN: 13 mg/dL (ref 6–20)
CALCIUM: 8.8 mg/dL — AB (ref 8.9–10.3)
CHLORIDE: 101 mmol/L (ref 101–111)
CO2: 21 mmol/L — AB (ref 22–32)
Creatinine, Ser: 0.99 mg/dL (ref 0.61–1.24)
GFR calc non Af Amer: >60 mL/min (ref >60)
Glucose, Bld: 108 mg/dL — ABNORMAL HIGH (ref 65–99)
Potassium: 4 mmol/L (ref 3.5–5.1)
Sodium: 134 mmol/L — ABNORMAL LOW (ref 135–145)

## 2018-03-26 MED ORDER — ASPIRIN ADULT LOW STRENGTH 81 MG PO TBEC: 81.0000 mg | DELAYED_RELEASE_TABLET | Freq: Every day | ORAL | 3 refills | Status: DC

## 2018-03-26 MED ORDER — FUROSEMIDE 20 MG PO TABS: 20.0000 mg | ORAL_TABLET | ORAL | 3 refills | Status: DC | PRN

## 2018-03-26 MED ORDER — ATORVASTATIN CALCIUM 80 MG PO TABS: 80.0000 mg | ORAL_TABLET | Freq: Every day | ORAL | 3 refills | Status: DC

## 2018-03-26 MED ORDER — SPIRONOLACTONE 25 MG PO TABS: 25.0000 mg | ORAL_TABLET | Freq: Every day | ORAL | 11 refills | Status: DC

## 2018-03-26 MED ORDER — CARVEDILOL 6.25 MG PO TABS: 6.2500 mg | ORAL_TABLET | Freq: Two times a day (BID) | ORAL | 11 refills | Status: DC

## 2018-03-26 MED ORDER — LOSARTAN POTASSIUM 25 MG PO TABS: 25.0000 mg | ORAL_TABLET | Freq: Every day | ORAL | 3 refills | Status: DC

## 2018-03-26 NOTE — Progress Notes (Signed)
Advanced Heart Failure Clinic Note   Primary Cardiologist: Dr. [name]   HPI:  [name] is a 57 y.o. male with h/o ETOH, VF Arrest, CAD s/p CABG 12/07/17, and ICM with EF 25-30%.  Admitted 12/6 - 12/14/17 with V-Fib arrest while chopping wood. Rrceived CPR in the field. R/LHC with severe 3vD as below and cardiogenic shock requiring IABP. Required milrinone with continued shock. Pt improved and IABP removed 12/10, but pt had recurrent VF arrest so IABP replaced. Stabilized and underwent CABG 12/07/17. Tolerated gradual wean of meds and extubation with no furthe events. HF medications titrated as tolerated.  Admitted 12/27 -> 12/28 with syncope and AKI thought to be 2/2 volume depletion on lasix, spiro, and Entresto. CT negative for ICH.   Improved rapidly with holding meds and gentle IVF. Entresto stopped. Spiro cut back and switched to low dose losartan.   He presents today for regular follow up. Overall feeling OK.  He does get occasional lightheaded with rapid standing. He is mildly orthostatic on exam. He denies exertional CP or dyspnea. No peripheral edema. No orthopnea or PND. No palpitations.  He drinks 2-3 beers daily. Still has intermittent numbness in his R leg since CABG access. Taking all medications as directed.   Echo this am personally reviewed by Dr. [name]. EF appears ~ 35-40%.   Review of systems complete and found to be negative unless listed in HPI.    TTE 11/30/17 25-30% RV normal.  TTE 12/04/2017 LVEF 40-45% (On IABP) TEE 12/07/17 LVEF 45-50% (On IABP)  LHC 11/29/17 Left Main  Dist LM lesion 40% stenosed  Left Anterior Descending  Prox LAD-1 lesion is 60% stenosed. The lesion is calcified.  Prox LAD-2 lesion 80% stenosed  First Diagonal Branch  Vessel is small in size.  Ost 1st Diag to 1st Diag lesion 70% stenosed  Second Diagonal Branch  Vessel is small in size. There is mild disease in the vessel.  Left Circumflex  Ost Cx lesion 30% stenosed   Prox Cx lesion is 80% stenosed. The lesion is calcified.  Prox Cx to Mid Cx lesion 80% stenosed  First Obtuse Marginal Branch  Vessel is small in size.  Ost 1st Mrg lesion 90% stenosed  Second Obtuse Marginal Branch  Ost 2nd Mrg to 2nd Mrg lesion 60% stenosed  Right Coronary Artery  Collaterals  Mid RCA filled by collaterals from Prox RCA.    Prox RCA to Mid RCA lesion is 100% stenosed. The lesion is chronically occluded with bridging and left-to-right collateral flow.  Right Posterior Descending Artery  Collaterals  RPDA filled by collaterals from 2nd Sept.   Past Medical History:  Diagnosis Date  . Arthritis    "hands; maybe my toes" (12/20/2017)  . Cardiac arrest (HCC) 11/29/2017   hx VF arrest/notes 12/20/2017  . Coronary artery disease   . GERD (gastroesophageal reflux disease)   . H/O ETOH abuse    /notes 12/20/2017  . High cholesterol   . Hypertension   . Seizures (HCC) ~ 2016; ~ 2017   "I've had a couple; I was at work" (12/20/2017)  . Syncope and collapse    "last few days" (12/20/2017)   Current Outpatient Medications  Medication Sig Dispense Refill  . ASPIRIN ADULT LOW STRENGTH 81 MG EC tablet Take 1 tablet (81 mg total) by mouth daily. 30 tablet 3  . atorvastatin (LIPITOR) 80 MG tablet Take 1 tablet (80 mg total) by mouth daily at 6 PM. 30 tablet 3  . carvedilol (  COREG) 6.25 MG tablet Take 1 tablet (6.25 mg total) by mouth 2 (two) times daily with a meal. 60 tablet 11  . furosemide (LASIX) 20 MG tablet Take 1 tablet (20 mg total) by mouth as needed. 90 tablet 3  . losartan (COZAAR) 25 MG tablet Take 1 tablet (25 mg total) by mouth daily. 30 tablet 3  . spironolactone (ALDACTONE) 25 MG tablet Take 1 tablet (25 mg total) by mouth daily. 30 tablet 11   No current facility-administered medications for this encounter.    No Known Allergies   Social History   Socioeconomic History  . Marital status: Divorced    Spouse name: Not on file  . Number of  children: Not on file  . Years of education: Not on file  . Highest education level: Not on file  Occupational History  . Not on file  Social Needs  . Financial resource strain: Not on file  . Food insecurity:    Worry: Not on file    Inability: Not on file  . Transportation needs:    Medical: Not on file    Non-medical: Not on file  Tobacco Use  . Smoking status: Never Smoker  . Smokeless tobacco: Never Used  Substance and Sexual Activity  . Alcohol use: Yes    Comment: 12/20/2017 "I quit drinking after I had the heart attack"  . Drug use: No  . Sexual activity: Not Currently  Lifestyle  . Physical activity:    Days per week: Not on file    Minutes per session: Not on file  . Stress: Not on file  Relationships  . Social connections:    Talks on phone: Not on file    Gets together: Not on file    Attends religious service: Not on file    Active member of club or organization: Not on file    Attends meetings of clubs or organizations: Not on file    Relationship status: Not on file  . Intimate partner violence:    Fear of current or ex partner: Not on file    Emotionally abused: Not on file    Physically abused: Not on file    Forced sexual activity: Not on file  Other Topics Concern  . Not on file  Social History Narrative  . Not on file   Family History  Problem Relation Age of Onset  . Heart attack Other    Vitals:   03/26/18 0957  BP: 126/78  Pulse: 86  SpO2: 95%  Weight: 158 lb 6 oz (71.8 kg)   Wt Readings from Last 3 Encounters:  03/26/18 158 lb 6 oz (71.8 kg)  03/12/18 156 lb 6.4 oz (70.9 kg)  02/12/18 153 lb 12.8 oz (69.8 kg)   Orthostatics - Sitting 118/76 - Standing 105/70  PHYSICAL EXAM: General: Well appearing. No resp difficulty. HEENT: Normal anicteric Neck: Supple. JVP 5-6. Carotids 2+ bilat; no bruits. No thyromegaly or nodule noted. Cor: PMI nondisplaced. RRR, No M/G/R noted Lungs: CTAB, normal effort. No wheeze Abdomen: soft,  nontender, nondistended. No hepatosplenomegaly. No bruits or masses. Good bowel sounds. Extremities: no cyanosis, clubbing, rash, edema Neuro: alert & oriented x 3, cranial nerves grossly intact. moves all 4 extremities w/o difficulty. Affect odd   ASSESSMENT & PLAN:  1. Chronic systolic CHF with ICM - TTE 11/30/17 25-30% RV normal.   - Echo today personally reviewed by Dr. Gala Romney. EF appears to have improved to ~35-40% range.  - NYHA III symptoms.   -  Volume status stable on exam. - Not on lasix currently. BMET today with mild orthostasis.  - Continue losartan 25 mg daily. Has previously failed Entresto with orthostasis, syncope, and AKI - Continue spiro 25 mg qhs.  - Continue coreg 6.25 mg BID   - Reinforced fluid restriction to < 2 L daily, sodium restriction to less than 2000 mg daily, and the importance of daily weights.    2. CAD s/p CABG 12/07/2017 - No s/s of ischemia. - Continue atorvastatin 80 mg daily - Continue ASA 81 mg daily.    3. Recent AKI - BMET today.   4. VF arrest in setting of STEMI - Stable.    5. ETOH abuse - Drinks 2-3 beers daily. - Encouraged complete cessation.  6. Mild orthostatic hypotension - Borderline. Follow. No changes to meds today.   Mildly orthostatic. Not on lasix. Continue current med regimen. EF improved on Echo today. Keep 2 month appt.   Graciella Freer, PA-C 03/26/18   Patient seen and examined with the above-signed Advanced Practice Provider and/or Housestaff. I personally reviewed laboratory data, imaging studies and relevant notes. I independently examined the patient and formulated the important aspects of the plan. I have edited the note to reflect any of my changes or salient points. I have personally discussed the plan with the patient and/or family.  Overall doing well. Affect is a bit odd so hard to get good history but seems to be improving steadily. NYHA II. Volume status looks good. No CP. Mildly orthostatic  on exam and by history. Not on lasix. Encouraged to increase salt intake as needed. Avoid ETOH. Echo reviewed personally. EF ~40% so out of ICD range. Check labs today.   Arvilla Meres, MD  11:28 PM

## 2018-03-26 NOTE — Progress Notes (Signed)
  Echocardiogram 2D Echocardiogram has been performed.  Jose PartridgeBrooke S [name] 03/26/2018, 9:39 AM

## 2018-03-26 NOTE — Patient Instructions (Addendum)
Labs today (will call for abnormal results, otherwise no news is good news)  Follow up as scheduled.  

## 2018-03-26 NOTE — Progress Notes (Signed)
CSW met with patient in the clinic. Patient has pending medicaid and brought his paperwork from [facility] office caseworker Dorise Hiss [phone]. CSW assisted with completion and return envelope. CSW contacted worker to confirm needed documentation and return mail. Patient signed release of info for CSW to assist and discuss medicaid application with DDS/Medicaid office.  Patient discussed ongoing phone issues as well as transportation needs. CSW assisted with phone and confirmation of working phone. CSW assisted with transportation needs through [facility] and patient will follow up for arrangements for upcoming appointments. CSW continues to be available as needed for support and resources. Raquel Sarna, [place], [place]

## 2018-04-01 ENCOUNTER — Encounter (HOSPITAL_COMMUNITY): Payer: Self-pay | Admitting: *Deleted

## 2018-04-01 NOTE — Progress Notes (Signed)
[facility], CASE # [id number].    Requested records faxed today to [phone].  Original request will be scanned to patient's electronic medical record.

## 2018-04-16 ENCOUNTER — Telehealth: Payer: Self-pay | Admitting: Licensed Clinical Social Worker

## 2018-04-16 NOTE — Telephone Encounter (Signed)
CSW received call from patient stating he is out of his medications and he received a denial letter from SSI. CSW will follow up with medications in the morning and try to see if paramedic can deliver. CSW will request letter from patient to explore options for possible appeal. Patient grateful for support and aware of follow up tomorrow for medication needs. Lasandra BeechJackie Turner Kunzman, LCSW, CCSW-MCS [phone]

## 2018-04-17 ENCOUNTER — Telehealth: Payer: Self-pay | Admitting: Licensed Clinical Social Worker

## 2018-04-17 ENCOUNTER — Other Ambulatory Visit (HOSPITAL_COMMUNITY): Payer: Self-pay

## 2018-04-17 MED ORDER — FUROSEMIDE 20 MG PO TABS: 20.0000 mg | ORAL_TABLET | ORAL | 2 refills | Status: DC | PRN

## 2018-04-17 MED ORDER — LOSARTAN POTASSIUM 25 MG PO TABS: 25.0000 mg | ORAL_TABLET | Freq: Every day | ORAL | 2 refills | Status: DC

## 2018-04-17 MED ORDER — SPIRONOLACTONE 25 MG PO TABS: 25.0000 mg | ORAL_TABLET | Freq: Every day | ORAL | 2 refills | Status: DC

## 2018-04-17 MED ORDER — CARVEDILOL 6.25 MG PO TABS: 6.2500 mg | ORAL_TABLET | Freq: Two times a day (BID) | ORAL | 2 refills | Status: DC

## 2018-04-17 MED ORDER — ASPIRIN ADULT LOW STRENGTH 81 MG PO TBEC: 81.0000 mg | DELAYED_RELEASE_TABLET | Freq: Every day | ORAL | 2 refills | Status: DC

## 2018-04-17 MED ORDER — ATORVASTATIN CALCIUM 80 MG PO TABS: 80.0000 mg | ORAL_TABLET | Freq: Every day | ORAL | 2 refills | Status: DC

## 2018-04-17 MED FILL — LOSARTAN POTASSIUM 25 MG TA: 25 | 30 days supply | Qty: 30 | Fill #0

## 2018-04-17 MED FILL — ATORVASTATIN 80 MG TABLET: 80 | 30 days supply | Qty: 30 | Fill #0

## 2018-04-17 MED FILL — CARVEDILOL 6.25 MG TABLET: 6.25 | 30 days supply | Qty: 60 | Fill #0

## 2018-04-17 MED FILL — FUROSEMIDE 20 MG TABS: 20 | 30 days supply | Qty: 30 | Fill #0

## 2018-04-17 MED FILL — SPIRONOLACTONE 25 MG TABLET: 25 | 30 days supply | Qty: 30 | Fill #0

## 2018-04-17 NOTE — Telephone Encounter (Signed)
Patient called to request refills on his prescriptions as well as delivery to his home. Patient states he forgot to get medications on last clinic visit and unable to afford at local pharmacy. Patient has a pending medicaid case as well as reports he just received notice that he was denied for SSI. CSW arranged for refills on meds and have asked Zack from our [facility] if could drop off to patient. Patient [place] paramedicine [place] Patient grateful for assistance and will send copy of disability denial letter back with Zack for CSW to review. CSW will contact patient after reciting letter for further assistance regarding possible appeal. Lasandra BeechJackie Shandy Vi, LCSW, CCSW-MCS [phone]

## 2018-05-27 ENCOUNTER — Telehealth (HOSPITAL_COMMUNITY): Payer: Self-pay | Admitting: Vascular Surgery

## 2018-05-27 NOTE — Telephone Encounter (Signed)
Left pt message to  change appt date and time from 6/18 10:20 TO 6/19 10:20 asked pt to call back to confirm

## 2018-06-11 ENCOUNTER — Encounter (HOSPITAL_COMMUNITY): Payer: Medicaid Other | Admitting: Internal Medicine

## 2018-06-12 ENCOUNTER — Other Ambulatory Visit (HOSPITAL_COMMUNITY): Payer: Self-pay | Admitting: Cardiology

## 2018-06-12 ENCOUNTER — Encounter (HOSPITAL_COMMUNITY): Payer: Medicaid Other | Admitting: Internal Medicine

## 2018-06-12 MED ORDER — SPIRONOLACTONE 25 MG PO TABS: 25.0000 mg | ORAL_TABLET | Freq: Every day | ORAL | 2 refills | Status: DC

## 2018-06-12 MED ORDER — LOSARTAN POTASSIUM 25 MG PO TABS: 25.0000 mg | ORAL_TABLET | Freq: Every day | ORAL | 2 refills | Status: DC

## 2018-06-12 MED ORDER — FUROSEMIDE 20 MG PO TABS: 20.0000 mg | ORAL_TABLET | ORAL | 2 refills | Status: DC | PRN

## 2018-06-12 MED ORDER — CARVEDILOL 6.25 MG PO TABS: 6.2500 mg | ORAL_TABLET | Freq: Two times a day (BID) | ORAL | 2 refills | Status: DC

## 2018-06-12 MED ORDER — ATORVASTATIN CALCIUM 80 MG PO TABS: 80.0000 mg | ORAL_TABLET | Freq: Every day | ORAL | 2 refills | Status: DC

## 2018-06-24 ENCOUNTER — Telehealth: Payer: Self-pay | Admitting: Licensed Clinical Social Worker

## 2018-06-24 NOTE — Telephone Encounter (Signed)
CSW received call from patient's brother stating that he has an appointment tomorrow for appeal on Social security. Brother is hopeful to have appeal postponed so he can possibly obtain legal assistance to advocate for patient at appeal. CSW advised to call immediately for request. Brother verbalizes understanding and will follow up. Lasandra BeechJackie Amond Speranza, LCSW, CCSW-MCS [phone]

## 2018-06-26 ENCOUNTER — Telehealth: Payer: Self-pay | Admitting: Licensed Clinical Social Worker

## 2018-06-26 NOTE — Telephone Encounter (Signed)
Patient called to share recent events in his [place] and concerns that he may be evicted. CSW discussed at length and encouraged patient to return call to his landlord and obtain clarification on paperwork and possible eviction. Patient also reminded of up coming appointments and need for scheduling of transportation. Patient will follow up and return call to CSW if needed. Lasandra BeechJackie Lenna Hagarty, LCSW, CCSW-MCS [phone]

## 2018-07-23 DIAGNOSIS — Z736 Limitation of activities due to disability: Secondary | ICD-10-CM

## 2018-08-06 ENCOUNTER — Ambulatory Visit: Payer: Medicaid Other

## 2018-08-06 ENCOUNTER — Telehealth: Payer: Self-pay | Admitting: *Deleted

## 2018-08-06 ENCOUNTER — Telehealth: Payer: Self-pay | Admitting: Licensed Clinical Social Worker

## 2018-08-06 NOTE — Telephone Encounter (Signed)
Patient called requesting assistance with rescheduling appointment at [facility] as he has no transportation. CSW contacted office staff and working out a resolution for a new appointment time and CSW will arrange transport. Patient informed of progress. Lasandra BeechJackie Oaklee Sunga, LCSW, CCSW-MCS [phone]

## 2018-08-07 ENCOUNTER — Ambulatory Visit: Payer: Medicaid Other | Admitting: Cardiothoracic Surgery

## 2018-08-08 ENCOUNTER — Encounter (HOSPITAL_COMMUNITY): Payer: Medicaid Other | Admitting: Internal Medicine

## 2018-09-10 ENCOUNTER — Other Ambulatory Visit (HOSPITAL_COMMUNITY): Payer: Self-pay | Admitting: *Deleted

## 2018-09-10 ENCOUNTER — Ambulatory Visit (HOSPITAL_COMMUNITY)
Admission: RE | Admit: 2018-09-10 | Discharge: 2018-09-10 | Disposition: A | Discharge status: Home / Self Care | Payer: Medicaid Other | Source: Ambulatory Visit | Attending: Internal Medicine | Admitting: Internal Medicine

## 2018-09-10 ENCOUNTER — Encounter (HOSPITAL_COMMUNITY): Payer: Self-pay | Admitting: Internal Medicine

## 2018-09-10 VITALS — BP 136/78 | HR 104 | Wt 146.0 lb

## 2018-09-10 DIAGNOSIS — Z8674 Personal history of sudden cardiac arrest: Secondary | ICD-10-CM | POA: Diagnosis not present

## 2018-09-10 DIAGNOSIS — R000 Tachycardia, unspecified: Secondary | ICD-10-CM | POA: Diagnosis not present

## 2018-09-10 DIAGNOSIS — I110 Hypertensive heart disease with heart failure: Secondary | ICD-10-CM | POA: Insufficient documentation

## 2018-09-10 DIAGNOSIS — Z7982 Long term (current) use of aspirin: Secondary | ICD-10-CM | POA: Diagnosis not present

## 2018-09-10 DIAGNOSIS — R200 Anesthesia of skin: Secondary | ICD-10-CM | POA: Insufficient documentation

## 2018-09-10 DIAGNOSIS — I5022 Chronic systolic (congestive) heart failure: Secondary | ICD-10-CM | POA: Insufficient documentation

## 2018-09-10 DIAGNOSIS — I2510 Atherosclerotic heart disease of native coronary artery without angina pectoris: Secondary | ICD-10-CM | POA: Insufficient documentation

## 2018-09-10 DIAGNOSIS — Z8249 Family history of ischemic heart disease and other diseases of the circulatory system: Secondary | ICD-10-CM | POA: Diagnosis not present

## 2018-09-10 DIAGNOSIS — K219 Gastro-esophageal reflux disease without esophagitis: Secondary | ICD-10-CM | POA: Insufficient documentation

## 2018-09-10 DIAGNOSIS — Z9119 Patient's noncompliance with other medical treatment and regimen: Secondary | ICD-10-CM | POA: Diagnosis not present

## 2018-09-10 DIAGNOSIS — I255 Ischemic cardiomyopathy: Secondary | ICD-10-CM | POA: Diagnosis not present

## 2018-09-10 DIAGNOSIS — M19042 Primary osteoarthritis, left hand: Secondary | ICD-10-CM | POA: Diagnosis not present

## 2018-09-10 DIAGNOSIS — Z79899 Other long term (current) drug therapy: Secondary | ICD-10-CM | POA: Diagnosis not present

## 2018-09-10 DIAGNOSIS — Z951 Presence of aortocoronary bypass graft: Secondary | ICD-10-CM | POA: Insufficient documentation

## 2018-09-10 DIAGNOSIS — I951 Orthostatic hypotension: Secondary | ICD-10-CM | POA: Diagnosis not present

## 2018-09-10 DIAGNOSIS — F10129 Alcohol abuse with intoxication, unspecified: Secondary | ICD-10-CM | POA: Diagnosis not present

## 2018-09-10 DIAGNOSIS — M19041 Primary osteoarthritis, right hand: Secondary | ICD-10-CM | POA: Diagnosis not present

## 2018-09-10 DIAGNOSIS — E7800 Pure hypercholesterolemia, unspecified: Secondary | ICD-10-CM | POA: Diagnosis not present

## 2018-09-10 MED ORDER — CARVEDILOL 3.125 MG PO TABS: 6.2500 mg | ORAL_TABLET | Freq: Two times a day (BID) | ORAL | 3 refills | Status: DC

## 2018-09-10 MED ORDER — LOSARTAN POTASSIUM 25 MG PO TABS: 25.0000 mg | ORAL_TABLET | Freq: Every day | ORAL | 2 refills | Status: DC

## 2018-09-10 MED ORDER — ATORVASTATIN CALCIUM 80 MG PO TABS: 80.0000 mg | ORAL_TABLET | Freq: Every day | ORAL | 2 refills | Status: DC

## 2018-09-10 MED ORDER — ASPIRIN ADULT LOW STRENGTH 81 MG PO TBEC: 81.0000 mg | DELAYED_RELEASE_TABLET | Freq: Every day | ORAL | 2 refills | Status: DC

## 2018-09-10 MED ORDER — CARVEDILOL 6.25 MG PO TABS: 6.2500 mg | ORAL_TABLET | Freq: Two times a day (BID) | ORAL | 2 refills | Status: DC

## 2018-09-10 MED FILL — ATORVASTATIN CALCIUM 80 MG: 80 | 30 days supply | Qty: 30 | Fill #0

## 2018-09-10 MED FILL — CARVEDILOL 6.25 MG TABLET: 6.25 | 30 days supply | Qty: 60 | Fill #0

## 2018-09-10 MED FILL — LOSARTAN POTASSIUM 25 MG TA: 25 | 30 days supply | Qty: 30 | Fill #0

## 2018-09-10 NOTE — Patient Instructions (Addendum)
START taking aspirin 81 mg Once daily  START taking Atorvastatin 80 mg Once daily  START taking Losartan 25 mg Once daily  START taking Carvedilol 3.125 mg Twice Daily  Please call our clinic at [phone], Option 5 when you are close to running our of meds.  Don't wait until you run out please.

## 2018-09-10 NOTE — Progress Notes (Signed)
CSW met with patient in the clinic. Patient reports he has been out of his medications and admitted to regular beer consumption. Patient admitted to drinking a beer prior to coming to his appointment today. He reports he still resides in the trailer park with his brother who covers the rent costs in [place]. He spoke of ongoing issues with some of his neighbors and states "I have to get out but have no where else to go". Patient has no income as he was recently denied by Disability although has medicaid which will cover costs of medications and medical expenses. Patient was unaware of his medical coverage under medicaid and pleased to know that he had access to prescription coverage. CSW assisted with obtaining medications from the outpatient pharmacy as well as transportation home. Patient limited with housing options due to lack of income. CSW discussed alcohol use and offered assistance if patient wishes to stop drinking. Patient verbalizes understanding and will reach out to CSW if needed. CSW available for ongoing support as needed. Raquel Sarna, [place], [place]

## 2018-09-10 NOTE — Progress Notes (Signed)
Advanced Heart Failure Clinic Note   Primary Cardiologist: Dr. Gala Romney   HPI:  [name] is a 57 y.o. male with h/o ETOH, VF Arrest, CAD s/p CABG 12/07/17, and ICM with EF 25-30%.  Admitted 12/6 - 12/14/17 with V-Fib arrest while chopping wood. Rrceived CPR in the field. R/LHC with severe 3vD as below and cardiogenic shock requiring IABP. Required milrinone with continued shock. Pt improved and IABP removed 12/10, but pt had recurrent VF arrest so IABP replaced. Stabilized and underwent CABG 12/07/17. Tolerated gradual wean of meds and extubation with no furthe events. HF medications titrated as tolerated.  Admitted 12/27 -> 12/28 with syncope and AKI thought to be 2/2 volume depletion on lasix, spiro, and Entresto. CT negative for ICH.   Improved rapidly with holding meds and gentle IVF. Entresto stopped. Spiro cut back and switched to low dose losartan.   He presents today for regular follow up. He is clearly intoxicated today and stumbling around the office  Last visit was mildly orthostatic. He has been off his medications since March. He says Automotive engineer with paramedicine was bringing him his medications before. Overall doing fair. He is SOB with ADLs and walking short distances. He has left sided chest soreness that is constant and has been present since his CABG in 2018. No orthopnea or PND. Has occasional mild edema to RLE. He still has numbness in RLE since CABG vein grafting. Still having dizziness with quick standing. He has fallen once in the last month. He says he was having a flashback from a dream and his head went numb and he fell. Denies dizziness. Eating canned foods. Weights: ~150 lbs. Drinks 6 beers daily, already had one this morning.  Echo 03/2018 personally reviewed by Dr. Gala Romney. EF appears ~35-40%.   Review of systems complete and found to be negative unless listed in HPI.   TTE 11/30/17 25-30% RV normal.  TTE 12/04/2017 LVEF 40-45% (On IABP) TEE 12/07/17 LVEF  45-50% (On IABP)  LHC 11/29/17 Left Main  Dist LM lesion 40% stenosed  Left Anterior Descending  Prox LAD-1 lesion is 60% stenosed. The lesion is calcified.  Prox LAD-2 lesion 80% stenosed  First Diagonal Branch  Vessel is small in size.  Ost 1st Diag to 1st Diag lesion 70% stenosed  Second Diagonal Branch  Vessel is small in size. There is mild disease in the vessel.  Left Circumflex  Ost Cx lesion 30% stenosed  Prox Cx lesion is 80% stenosed. The lesion is calcified.  Prox Cx to Mid Cx lesion 80% stenosed  First Obtuse Marginal Branch  Vessel is small in size.  Ost 1st Mrg lesion 90% stenosed  Second Obtuse Marginal Branch  Ost 2nd Mrg to 2nd Mrg lesion 60% stenosed  Right Coronary Artery  Collaterals  Mid RCA filled by collaterals from Prox RCA.    Prox RCA to Mid RCA lesion is 100% stenosed. The lesion is chronically occluded with bridging and left-to-right collateral flow.  Right Posterior Descending Artery  Collaterals  RPDA filled by collaterals from 2nd Sept.   Past Medical History:  Diagnosis Date  . Arthritis    "hands; maybe my toes" (12/20/2017)  . Cardiac arrest (HCC) 11/29/2017   hx VF arrest/notes 12/20/2017  . Coronary artery disease   . GERD (gastroesophageal reflux disease)   . H/O ETOH abuse    /notes 12/20/2017  . High cholesterol   . Hypertension   . Seizures (HCC) ~ 2016; ~ 2017   "I've had a  couple; I was at work" (12/20/2017)  . Syncope and collapse    "last few days" (12/20/2017)   Current Outpatient Medications  Medication Sig Dispense Refill  . ASPIRIN ADULT LOW STRENGTH 81 MG EC tablet Take 1 tablet (81 mg total) by mouth daily. 30 tablet 2  . atorvastatin (LIPITOR) 80 MG tablet Take 1 tablet (80 mg total) by mouth daily at 6 PM. 30 tablet 2  . carvedilol (COREG) 6.25 MG tablet Take 1 tablet (6.25 mg total) by mouth 2 (two) times daily with a meal. 60 tablet 2  . furosemide (LASIX) 20 MG tablet Take 1 tablet (20 mg total) by mouth as  needed. 30 tablet 2  . losartan (COZAAR) 25 MG tablet Take 1 tablet (25 mg total) by mouth daily. 30 tablet 2  . spironolactone (ALDACTONE) 25 MG tablet Take 1 tablet (25 mg total) by mouth daily. 30 tablet 2   No current facility-administered medications for this encounter.    No Known Allergies   Social History   Socioeconomic History  . Marital status: Divorced    Spouse name: Not on file  . Number of children: Not on file  . Years of education: Not on file  . Highest education level: Not on file  Occupational History  . Not on file  Social Needs  . Financial resource strain: Not on file  . Food insecurity:    Worry: Not on file    Inability: Not on file  . Transportation needs:    Medical: Not on file    Non-medical: Not on file  Tobacco Use  . Smoking status: Never Smoker  . Smokeless tobacco: Never Used  Substance and Sexual Activity  . Alcohol use: Yes    Comment: 12/20/2017 "I quit drinking after I had the heart attack"  . Drug use: No  . Sexual activity: Not Currently  Lifestyle  . Physical activity:    Days per week: Not on file    Minutes per session: Not on file  . Stress: Not on file  Relationships  . Social connections:    Talks on phone: Not on file    Gets together: Not on file    Attends religious service: Not on file    Active member of club or organization: Not on file    Attends meetings of clubs or organizations: Not on file    Relationship status: Not on file  . Intimate partner violence:    Fear of current or ex partner: Not on file    Emotionally abused: Not on file    Physically abused: Not on file    Forced sexual activity: Not on file  Other Topics Concern  . Not on file  Social History Narrative  . Not on file   Family History  Problem Relation Age of Onset  . Heart attack Other    Vitals:   09/10/18 1259  BP: 136/78  Pulse: (!) 104  SpO2: 97%  Weight: 66.2 kg (146 lb)   Wt Readings from Last 3 Encounters:  09/10/18 66.2  kg (146 lb)  03/26/18 71.8 kg (158 lb 6 oz)  03/12/18 70.9 kg (156 lb 6.4 oz)   Orthostatics: Sitting: 142/96 Standing: 130/100  PHYSICAL EXAM: General: Disheveled appearing. No resp difficulty. Unsteady on his feet. +tremors. Appears to be intoxicated. HEENT: Normal anicteric Neck: Supple. JVP 5-6. Carotids 2+ bilat; no bruits. No thyromegaly or nodule noted. Cor: PMI nondisplaced. Tachy, regular, No M/G/R noted Lungs: CTAB, normal effort. No  wheeze Abdomen: Soft, non-tender, non-distended, no HSM. No bruits or masses. +BS  Extremities: no cyanosis, clubbing, rash, edema Neuro: Intoxcated. Cranial nerves intact. Moves al 4 extremities without weakness. Stumbling   EKG: Sinus tach 110 bpm   ASSESSMENT & PLAN:  1. Chronic systolic CHF with ICM - TTE 11/30/17 25-30% RV normal.   - Echo 03/2018: EF 35-40% (reviewed by Dr Gala Romney), RV normal - Worse NYHA III symptoms.   - Volume status okay - Not on lasix currently. - Restart losartan 25 mg daily. Has previously failed Entresto with orthostasis, syncope, and AKI - Hold off on spiro with noncompliance.  - Restart coreg 3.125 mg BID   - Reinforced fluid restriction to < 2 L daily, sodium restriction to less than 2000 mg daily, and the importance of daily weights.   - Encouraged him to take his medications every day and to contact us if he is having problems getting them. Instructed him to use medicaid transport for his follow up and to call us if he needs help with it.  2. CAD s/p CABG 12/07/2017 - No s/s ischemia. - Restart ASA and atorvastatin.    3. VF arrest in setting of STEMI - Stable.    4. ETOH abuse - Drinks 6 beers daily. Appears intoxicated today in clinic. - Encouraged complete cessation. No change.   5. Mild orthostatic hypotension - Remains mildly orthostatic off medication. Seems to be intoxicated today.   Restart medications as above. Follow up in 2 months  Alford Highland, NP 09/10/18   Patient seen  and examined with the above-signed Advanced Practice Provider and/or Housestaff. I personally reviewed laboratory data, imaging studies and relevant notes. I independently examined the patient and formulated the important aspects of the plan. I have edited the note to reflect any of my changes or salient points. I have personally discussed the plan with the patient and/or family.  He is intoxicated in clinic today. Has been out of HF meds due to noncompliance.Volume status looks OK. Appears essentially stable NYHA II-III.Longtalk about need for compliance and to stop drinking. Had him meet with SW today in clinic to get meds. Will attempt to re-engage with Paramedicine. Little we can do to help him unless he also starts to help himself.   Arvilla Meres, MD  11:50 PM

## 2018-10-31 ENCOUNTER — Other Ambulatory Visit (HOSPITAL_COMMUNITY): Payer: Self-pay | Admitting: Cardiology

## 2018-10-31 MED ORDER — ASPIRIN ADULT LOW STRENGTH 81 MG PO TBEC: 81.0000 mg | DELAYED_RELEASE_TABLET | Freq: Every day | ORAL | 2 refills | Status: DC

## 2018-10-31 MED ORDER — ATORVASTATIN CALCIUM 80 MG PO TABS: 80.0000 mg | ORAL_TABLET | Freq: Every day | ORAL | 2 refills | Status: DC

## 2018-10-31 MED ORDER — LOSARTAN POTASSIUM 25 MG PO TABS: 25.0000 mg | ORAL_TABLET | Freq: Every day | ORAL | 2 refills | Status: DC

## 2018-10-31 MED ORDER — CARVEDILOL 3.125 MG PO TABS: 6.2500 mg | ORAL_TABLET | Freq: Two times a day (BID) | ORAL | 3 refills | Status: DC

## 2018-10-31 MED FILL — LOSARTAN POTASSIUM 25 MG TA: 25 | 30 days supply | Qty: 30 | Fill #0

## 2018-10-31 MED FILL — ATORVASTATIN CALCIUM 80 MG: 80 | 30 days supply | Qty: 30 | Fill #0

## 2018-10-31 MED FILL — CARVEDILOL 3.125 MG TABLET: 3.125 | 30 days supply | Qty: 60 | Fill #0

## 2019-02-06 ENCOUNTER — Telehealth (HOSPITAL_COMMUNITY): Payer: Self-pay | Admitting: Surgery

## 2019-02-06 NOTE — Telephone Encounter (Signed)
Patient called to report that he had been out of meds for "a while".  He says he was supposed to have an appt and has not been contacted.  I have scheduled an appt on 02/24/2019.  I will forward message to provider to advise if he should restart medications now.

## 2019-02-07 NOTE — Telephone Encounter (Signed)
We can refill coreg, losartan, aspirin, and statin. I would not restart spiro. Thanks [employer]

## 2019-02-12 ENCOUNTER — Other Ambulatory Visit (HOSPITAL_COMMUNITY): Payer: Self-pay | Admitting: Surgery

## 2019-02-12 MED ORDER — ATORVASTATIN CALCIUM 80 MG PO TABS: 80.0000 mg | ORAL_TABLET | Freq: Every day | ORAL | 2 refills | Status: DC

## 2019-02-12 MED ORDER — ASPIRIN ADULT LOW STRENGTH 81 MG PO TBEC: 81.0000 mg | DELAYED_RELEASE_TABLET | Freq: Every day | ORAL | 2 refills | Status: DC

## 2019-02-12 MED ORDER — LOSARTAN POTASSIUM 25 MG PO TABS: 25.0000 mg | ORAL_TABLET | Freq: Every day | ORAL | 2 refills | Status: DC

## 2019-02-12 MED FILL — LOSARTAN POTASSIUM 25 MG TA: 25 | 30 days supply | Qty: 30 | Fill #0

## 2019-02-12 MED FILL — ATORVASTATIN 80 MG TABLET: 80 | 30 days supply | Qty: 30 | Fill #0

## 2019-02-12 NOTE — Telephone Encounter (Signed)
Patient's Losartan , Aspirin and Atorvastatin refill sent to [facility] pharmacy to be filled per Duwaine Maxin NP.  He has a scheduled appt 3/3 at 11:30.  I will ask a HF Community Paramedic to get medications and deliver to patient if possible.

## 2019-02-24 NOTE — Progress Notes (Signed)
Advanced Heart Failure Clinic Note   Primary Cardiologist: Dr. Gala Romney   HPI:  [name] is a 58 y.o. male with h/o ETOH, VF Arrest, CAD s/p CABG 12/07/17, and ICM with EF 25-30%.  Admitted 12/6 - 12/14/17 with V-Fib arrest while chopping wood. Rrceived CPR in the field. R/LHC with severe 3vD as below and cardiogenic shock requiring IABP. Required milrinone with continued shock. Pt improved and IABP removed 12/10, but pt had recurrent VF arrest so IABP replaced. Stabilized and underwent CABG 12/07/17. Tolerated gradual wean of meds and extubation with no furthe events. HF medications titrated as tolerated.  Admitted 12/27 -> 12/28 with syncope and AKI thought to be 2/2 volume depletion on lasix, spiro, and Entresto. CT negative for ICH.   Improved rapidly with holding meds and gentle IVF. Entresto stopped. Spiro cut back and switched to low dose losartan.   He presents today for follow up. At last visit, was clearly intoxicated and out of his medications for ~6 months. Medications restarted. He called 2/13 and stated he was again out of his medications for "awhile". Has re-established with paramedicine and been back on losartan since 2 weeks ago. He is SOB walking short distances. He has chronic, left sided chest soreness/tightness that has been present since his CABG in 2018, also present at last visit. No worse. Not affected by exertion, occasional relieved by stretching. He states he has cut back somewhat on drinking due to finances, but still drinking when he can afford.   EKG: NSR 82 bpm, personally reviewed. No changes compared to last EKG.   Echo 03/2018 personally reviewed by Dr. Gala Romney. EF appears ~35-40%.   Review of systems complete and found to be negative unless listed in HPI.    TTE 11/30/17 25-30% RV normal.  TTE 12/04/2017 LVEF 40-45% (On IABP) TEE 12/07/17 LVEF 45-50% (On IABP)  LHC 11/29/17 Left Main  Dist LM lesion 40% stenosed  Left Anterior Descending    Prox LAD-1 lesion is 60% stenosed. The lesion is calcified.  Prox LAD-2 lesion 80% stenosed  First Diagonal Branch  Vessel is small in size.  Ost 1st Diag to 1st Diag lesion 70% stenosed  Second Diagonal Branch  Vessel is small in size. There is mild disease in the vessel.  Left Circumflex  Ost Cx lesion 30% stenosed  Prox Cx lesion is 80% stenosed. The lesion is calcified.  Prox Cx to Mid Cx lesion 80% stenosed  First Obtuse Marginal Branch  Vessel is small in size.  Ost 1st Mrg lesion 90% stenosed  Second Obtuse Marginal Branch  Ost 2nd Mrg to 2nd Mrg lesion 60% stenosed  Right Coronary Artery  Collaterals  Mid RCA filled by collaterals from Prox RCA.    Prox RCA to Mid RCA lesion is 100% stenosed. The lesion is chronically occluded with bridging and left-to-right collateral flow.  Right Posterior Descending Artery  Collaterals  RPDA filled by collaterals from 2nd Sept.   Past Medical History:  Diagnosis Date  . Arthritis    "hands; maybe my toes" (12/20/2017)  . Cardiac arrest (HCC) 11/29/2017   hx VF arrest/notes 12/20/2017  . Coronary artery disease   . GERD (gastroesophageal reflux disease)   . H/O ETOH abuse    /notes 12/20/2017  . High cholesterol   . Hypertension   . Seizures (HCC) ~ 2016; ~ 2017   "I've had a couple; I was at work" (12/20/2017)  . Syncope and collapse    "last few days" (12/20/2017)  Current Outpatient Medications  Medication Sig Dispense Refill  . ASPIRIN ADULT LOW STRENGTH 81 MG EC tablet Take 1 tablet (81 mg total) by mouth daily. 30 tablet 2  . atorvastatin (LIPITOR) 80 MG tablet Take 1 tablet (80 mg total) by mouth daily at 6 PM. 30 tablet 2  . losartan (COZAAR) 25 MG tablet Take 1 tablet (25 mg total) by mouth daily. 30 tablet 2   No current facility-administered medications for this encounter.    No Known Allergies   Social History   Socioeconomic History  . Marital status: Divorced    Spouse name: Not on file  . Number of  children: Not on file  . Years of education: Not on file  . Highest education level: Not on file  Occupational History  . Not on file  Social Needs  . Financial resource strain: Not on file  . Food insecurity:    Worry: Not on file    Inability: Not on file  . Transportation needs:    Medical: Not on file    Non-medical: Not on file  Tobacco Use  . Smoking status: Never Smoker  . Smokeless tobacco: Never Used  Substance and Sexual Activity  . Alcohol use: Yes    Comment: 12/20/2017 "I quit drinking after I had the heart attack"  . Drug use: No  . Sexual activity: Not Currently  Lifestyle  . Physical activity:    Days per week: Not on file    Minutes per session: Not on file  . Stress: Not on file  Relationships  . Social connections:    Talks on phone: Not on file    Gets together: Not on file    Attends religious service: Not on file    Active member of club or organization: Not on file    Attends meetings of clubs or organizations: Not on file    Relationship status: Not on file  . Intimate partner violence:    Fear of current or ex partner: Not on file    Emotionally abused: Not on file    Physically abused: Not on file    Forced sexual activity: Not on file  Other Topics Concern  . Not on file  Social History Narrative  . Not on file   Family History  Problem Relation Age of Onset  . Heart attack Other    Vitals:   02/25/19 0856  BP: (!) 148/106  Pulse: 93  SpO2: 98%  Weight: 73.3 kg (161 lb 9.6 oz)   Wt Readings from Last 3 Encounters:  02/25/19 73.3 kg (161 lb 9.6 oz)  09/10/18 66.2 kg (146 lb)  03/26/18 71.8 kg (158 lb 6 oz)   PHYSICAL EXAM: General: Well appearing. No resp difficulty. HEENT: Normal Neck: Supple. JVP 5-6. Carotids 2+ bilat; no bruits. No thyromegaly or nodule noted. Cor: PMI nondisplaced. RRR, No M/G/R noted Lungs: CTAB, normal effort. Abdomen: Soft, non-tender, non-distended, no HSM. No bruits or masses. +BS  Extremities:  No cyanosis, clubbing, or rash. R and LLE no edema.  Neuro: Alert & orientedx3, cranial nerves grossly intact. moves all 4 extremities w/o difficulty. Affect pleasant   ASSESSMENT & PLAN:  1. Chronic systolic CHF with ICM - TTE 11/30/17 25-30% RV normal.   - Echo 03/2018: EF 35-40% (reviewed by Dr Gala Romney), RV normal - Will plan to repeat once we get back on medications.  - NYHA III symptoms chronically. - Volume status stable on exam.   - Not on lasix  currently.  - Continue losartan 25 mg daily. Has previously failed Entresto with orthostasis, syncope, and AKI - Hold off on spiro with noncompliance.  - Restart coreg 3.125 mg BID   - Reinforced fluid restriction to < 2 L daily, sodium restriction to less than 2000 mg daily, and the importance of daily weights.   - Encouraged him to take his medications every day and to contact us if he is having problems getting them. Instructed him to use medicaid transport for his follow up and to call us if he needs help with it.  2. CAD s/p CABG 12/07/2017 - No s/s of ischemia.    - Restart ASA and atorvastatin.    3. VF arrest in setting of STEMI - Stable.   4. ETOH abuse - He says he has cut back by necessity, finances. Drinking several times a week when he can afford.  - Encouraged complete cessation.  5. Mild orthostatic hypotension - Has limited med titration. Follow closely.   Has had chronic trouble with non-compliance. Now back with paramedicine. See back in 4-6 weeks for continued med titration. Will need repeat Echo come summer if we are able to titrate his medications.   Graciella Freer, PA-C 02/25/19   Greater than 50% of the 25 minute visit was spent in counseling/coordination of care regarding disease state education, salt/fluid restriction, sliding scale diuretics, and medication compliance.

## 2019-02-25 ENCOUNTER — Encounter (HOSPITAL_COMMUNITY): Payer: Self-pay

## 2019-02-25 ENCOUNTER — Other Ambulatory Visit: Payer: Self-pay

## 2019-02-25 ENCOUNTER — Ambulatory Visit (HOSPITAL_COMMUNITY)
Admission: RE | Admit: 2019-02-25 | Discharge: 2019-02-25 | Disposition: A | Discharge status: Home / Self Care | Payer: Medicaid Other | Source: Ambulatory Visit | Attending: Cardiology | Admitting: Cardiology

## 2019-02-25 VITALS — BP 148/106 | HR 93 | Wt 161.6 lb

## 2019-02-25 DIAGNOSIS — Z79899 Other long term (current) drug therapy: Secondary | ICD-10-CM | POA: Diagnosis not present

## 2019-02-25 DIAGNOSIS — Z7982 Long term (current) use of aspirin: Secondary | ICD-10-CM | POA: Diagnosis not present

## 2019-02-25 DIAGNOSIS — M1990 Unspecified osteoarthritis, unspecified site: Secondary | ICD-10-CM | POA: Insufficient documentation

## 2019-02-25 DIAGNOSIS — I110 Hypertensive heart disease with heart failure: Secondary | ICD-10-CM | POA: Diagnosis not present

## 2019-02-25 DIAGNOSIS — Z7901 Long term (current) use of anticoagulants: Secondary | ICD-10-CM | POA: Diagnosis not present

## 2019-02-25 DIAGNOSIS — F1010 Alcohol abuse, uncomplicated: Secondary | ICD-10-CM | POA: Insufficient documentation

## 2019-02-25 DIAGNOSIS — N179 Acute kidney failure, unspecified: Secondary | ICD-10-CM | POA: Insufficient documentation

## 2019-02-25 DIAGNOSIS — R0602 Shortness of breath: Secondary | ICD-10-CM | POA: Insufficient documentation

## 2019-02-25 DIAGNOSIS — I2510 Atherosclerotic heart disease of native coronary artery without angina pectoris: Secondary | ICD-10-CM | POA: Diagnosis not present

## 2019-02-25 DIAGNOSIS — I5022 Chronic systolic (congestive) heart failure: Secondary | ICD-10-CM | POA: Insufficient documentation

## 2019-02-25 DIAGNOSIS — Z8674 Personal history of sudden cardiac arrest: Secondary | ICD-10-CM | POA: Diagnosis not present

## 2019-02-25 DIAGNOSIS — Z9119 Patient's noncompliance with other medical treatment and regimen: Secondary | ICD-10-CM | POA: Insufficient documentation

## 2019-02-25 DIAGNOSIS — Z951 Presence of aortocoronary bypass graft: Secondary | ICD-10-CM

## 2019-02-25 DIAGNOSIS — I951 Orthostatic hypotension: Secondary | ICD-10-CM | POA: Diagnosis not present

## 2019-02-25 LAB — CBC
HEMATOCRIT: 47.3 % (ref 39.0–52.0)
Hemoglobin: 15.8 g/dL (ref 13.0–17.0)
MCH: 34.1 pg — ABNORMAL HIGH (ref 26.0–34.0)
MCHC: 33.4 g/dL (ref 30.0–36.0)
MCV: 101.9 fL — AB (ref 80.0–100.0)
Platelets: 159 10*3/uL (ref 150–400)
RBC: 4.64 MIL/uL (ref 4.22–5.81)
RDW: 11.9 % (ref 11.5–15.5)
WBC: 9.3 10*3/uL (ref 4.0–10.5)
nRBC: 0 % (ref 0.0–0.2)

## 2019-02-25 LAB — BASIC METABOLIC PANEL
Anion gap: 10 (ref 5–15)
BUN: 7 mg/dL (ref 6–20)
CALCIUM: 9.2 mg/dL (ref 8.9–10.3)
CO2: 23 mmol/L (ref 22–32)
CREATININE: 0.98 mg/dL (ref 0.61–1.24)
Chloride: 105 mmol/L (ref 98–111)
GFR calc Af Amer: >60 mL/min (ref >60)
GLUCOSE: 106 mg/dL — AB (ref 70–99)
Potassium: 4.1 mmol/L (ref 3.5–5.1)
Sodium: 138 mmol/L (ref 135–145)

## 2019-02-25 LAB — BRAIN NATRIURETIC PEPTIDE: B Natriuretic Peptide: 135.4 pg/mL — ABNORMAL HIGH (ref 0.0–100.0)

## 2019-02-25 MED ORDER — CARVEDILOL 3.125 MG PO TABS: 3.1250 mg | ORAL_TABLET | Freq: Two times a day (BID) | ORAL | 3 refills | Status: DC

## 2019-02-25 MED FILL — CARVEDILOL 3.125 MG TABLET: 3.125 | 30 days supply | Qty: 60 | Fill #0

## 2019-02-25 NOTE — Patient Instructions (Signed)
Lab work done today. We will notify you of any abnormal lab work.  RESTART Carvedilol 3.125 (1 tab) twice daily  Follow up with the Advanced Practice Provider in 4-6 weeks

## 2019-04-08 ENCOUNTER — Other Ambulatory Visit: Payer: Self-pay

## 2019-04-08 ENCOUNTER — Other Ambulatory Visit (HOSPITAL_COMMUNITY): Payer: Self-pay

## 2019-04-08 ENCOUNTER — Telehealth (HOSPITAL_COMMUNITY): Payer: Self-pay | Admitting: Licensed Clinical Social Worker

## 2019-04-08 ENCOUNTER — Ambulatory Visit (HOSPITAL_COMMUNITY)
Admission: RE | Admit: 2019-04-08 | Discharge: 2019-04-08 | Disposition: A | Discharge status: Home / Self Care | Payer: Medicaid Other | Source: Ambulatory Visit | Attending: Cardiology | Admitting: Cardiology

## 2019-04-08 DIAGNOSIS — Z91199 Patient's noncompliance with other medical treatment and regimen due to unspecified reason: Secondary | ICD-10-CM

## 2019-04-08 DIAGNOSIS — Z951 Presence of aortocoronary bypass graft: Secondary | ICD-10-CM

## 2019-04-08 DIAGNOSIS — I5022 Chronic systolic (congestive) heart failure: Secondary | ICD-10-CM

## 2019-04-08 DIAGNOSIS — F1010 Alcohol abuse, uncomplicated: Secondary | ICD-10-CM

## 2019-04-08 DIAGNOSIS — Z9119 Patient's noncompliance with other medical treatment and regimen: Secondary | ICD-10-CM

## 2019-04-08 MED ORDER — CARVEDILOL 3.125 MG PO TABS: 3.1250 mg | ORAL_TABLET | Freq: Two times a day (BID) | ORAL | 1 refills | Status: DC

## 2019-04-08 MED ORDER — POTASSIUM CHLORIDE CRYS ER 20 MEQ PO TBCR: 20.0000 meq | EXTENDED_RELEASE_TABLET | ORAL | 0 refills | Status: DC | PRN

## 2019-04-08 MED ORDER — FUROSEMIDE 20 MG PO TABS: 20.0000 mg | ORAL_TABLET | ORAL | 0 refills | Status: DC | PRN

## 2019-04-08 MED ORDER — ASPIRIN ADULT LOW STRENGTH 81 MG PO TBEC: 81.0000 mg | DELAYED_RELEASE_TABLET | Freq: Every day | ORAL | 1 refills | Status: DC

## 2019-04-08 MED ORDER — ATORVASTATIN CALCIUM 80 MG PO TABS: 80.0000 mg | ORAL_TABLET | Freq: Every day | ORAL | 1 refills | Status: DC

## 2019-04-08 MED ORDER — LOSARTAN POTASSIUM 25 MG PO TABS: 25.0000 mg | ORAL_TABLET | Freq: Every day | ORAL | 1 refills | Status: DC

## 2019-04-08 MED FILL — CARVEDILOL 3.125 MG TABLET: 3.125 | 90 days supply | Qty: 180 | Fill #0

## 2019-04-08 MED FILL — ATORVASTATIN 80 MG TABLET: 80 | 90 days supply | Qty: 90 | Fill #0

## 2019-04-08 MED FILL — LOSARTAN POTASSIUM 25 MG TA: 25 | 90 days supply | Qty: 90 | Fill #0

## 2019-04-08 MED FILL — FUROSEMIDE 20 MG TABS: 20 | 6 days supply | Qty: 6 | Fill #0

## 2019-04-08 MED FILL — POTASSIUM CHLORIDE CRYS ER: 20 | 6 days supply | Qty: 6 | Fill #0

## 2019-04-08 NOTE — Progress Notes (Signed)
[name] was seen at home today to the purpose of medication delivery and filling his pillbox. He already had a virtual visit with the HF clinic so nothing further was necessary at today's visit. I will follow up at the request of the HF clinic staff.

## 2019-04-08 NOTE — Progress Notes (Signed)
Instructions provided to patient over the phone. Reports understanding of AVS.  Questions answered. AVS mailed.

## 2019-04-08 NOTE — Addendum Note (Signed)
Encounter addended by: Marisa Hua, RN on: 04/08/2019 10:19 AM  Actions taken: Pharmacy for encounter modified, Order list changed, Clinical Note Signed

## 2019-04-08 NOTE — Telephone Encounter (Signed)
CSW received consult that patient has run out of several medications and needs help obtaining them as he is currently unable to afford them.  CSW called pharmacy who confirms they can waive copay for patient as he is medicaid.  Community paramedic to pick up medications and help patient will pillbox.  CSW will continue to follow and assist as needed.  Burna Sis, LCSW Clinical Social Worker [facility] Clinic Desk#: [phone] Cell#: [phone]

## 2019-04-08 NOTE — Addendum Note (Signed)
Encounter addended by: Alford Highland, NP on: 04/08/2019 9:59 AM  Actions taken: Clinical Note Signed

## 2019-04-08 NOTE — Patient Instructions (Addendum)
Refills for all medicines were sent to [facility]  TAKE Lasix 20mg  (1 tab) today and tomorrow  Take Potassium 20 meq (1 tab) today and tomorrow, THEN as needed if you take a dose of Lasix.  Your physician recommends that you schedule a follow-up appointment in: 1 week for f/u TELEHEALTH phone call scheduled 04/06/19 2pm

## 2019-04-08 NOTE — Progress Notes (Addendum)
Heart Failure TeleHealth Note  Due to national recommendations of social distancing due to COVID 19, Audio/video telehealth visit is felt to be most appropriate for this patient at this time.  See MyChart message from today for patient consent regarding telehealth for [name].  Date:  04/08/2019   ID:  [name], DOB 09/17/1961, MRN [id number]  Location: Home  Provider location: [place] [facility] Type of Visit: Established patient   PCP:  Patient, No Pcp Per  Primary HF: Dr Gala Romney  Chief Complaint: HF follow up   History of Present Illness: [name] is a 58 y.o. male with h/o ETOH, VF Arrest, CAD s/p CABG 12/07/17, and ICM with EF 25-30%.  Admitted 12/6 - 12/14/17 with V-Fib arrest while chopping wood. Rrceived CPR in the field. R/LHC with severe 3vD as below and cardiogenic shock requiring IABP. Required milrinone with continued shock. Pt improved and IABP removed 12/10, but pt had recurrent VF arrest so IABP replaced. Stabilized and underwent CABG 12/07/17. Tolerated gradual wean of meds and extubation with no furthe events. HF medications titrated as tolerated.  Admitted 12/27 -> 12/28 with syncope and AKI thought to be 2/2 volume depletion on lasix, spiro, and Entresto. CT negative for ICH. Improved rapidly with holding meds and gentle IVF. Entresto stopped. Spiro cut back and switched to low dose losartan.   He presents via Web designer for a telehealth visit today. Last visit coreg, ASA, and atorvastatin were restarted. He was drinking less because he could not afford it. Weight was 161 lbs. Overall doing okay. Only taking aspirin and coreg. He feels more SOB. Sometimes SOB with ADls +benopnea. +PND over the last few days. Sleeps in a recliner. No edema, but abdomin is bloated. Has chronic left sided chest pain since his CABG, also feels like he pulled a muscle. Pain is present all the time and is located in left breast area.  No dizziness. No cough, fever, or chills. Staying at home, but has neighbors over. Allergies have been bad. Weight is up to 168 lbs, up from 161 lbs last visit. Appetite is great. Drinking 2-3 beer/day. Limited by income. Eating high salt foods and drinking lots of water.   Denies symptoms worrisome for COVID 19.   Past Medical History:  Diagnosis Date  . Arthritis    "hands; maybe my toes" (12/20/2017)  . Cardiac arrest (HCC) 11/29/2017   hx VF arrest/notes 12/20/2017  . Coronary artery disease   . GERD (gastroesophageal reflux disease)   . H/O ETOH abuse    /notes 12/20/2017  . High cholesterol   . Hypertension   . Seizures (HCC) ~ 2016; ~ 2017   "I've had a couple; I was at work" (12/20/2017)  . Syncope and collapse    "last few days" (12/20/2017)   Past Surgical History:  Procedure Laterality Date  . CARDIAC CATHETERIZATION    . CORONARY ARTERY BYPASS GRAFT N/A 12/07/2017   Procedure: CORONARY ARTERY BYPASS GRAFTING (CABG) times four using left internal mammary artery and right saphenous vein using endoscope for harvest.;  Surgeon: [name], MD;  Location: [facility] OR;  Service: Open Heart Surgery;  Laterality: N/A;  . IABP INSERTION N/A 11/29/2017   Procedure: IABP Insertion;  Surgeon: [name], MD;  Location: [facility] INVASIVE CV LAB;  Service: Cardiovascular;  Laterality: N/A;  . IABP INSERTION N/A 12/03/2017   Procedure: IABP INSERTION;  Surgeon: [name], MD;  Location: [facility] INVASIVE CV LAB;  Service: Cardiovascular;  Laterality: N/A;  . LEFT HEART CATH AND CORONARY ANGIOGRAPHY N/A 11/29/2017   Procedure: LEFT HEART CATH AND CORONARY ANGIOGRAPHY;  Surgeon: [name], [name], MD;  Location: [facility] INVASIVE CV LAB;  Service: Cardiovascular;  Laterality: N/A;  . RIGHT HEART CATH N/A 11/29/2017   Procedure: RIGHT HEART CATH;  Surgeon: Dolores PattyBensimhon, [name], MD;  Location: [facility] INVASIVE CV LAB;  Service: Cardiovascular;  Laterality: N/A;  . TEE WITHOUT CARDIOVERSION N/A  12/07/2017   Procedure: TRANSESOPHAGEAL ECHOCARDIOGRAM (TEE);  Surgeon: [name], [name], MD;  [facility];  Service: Open Heart Surgery;  Laterality: N/A;  . TONSILLECTOMY AND ADENOIDECTOMY  ~ 1972     Current Outpatient Medications  Medication Sig Dispense Refill  . ASPIRIN ADULT LOW STRENGTH 81 MG EC tablet Take 1 tablet (81 mg total) by mouth daily. 30 tablet 2  . carvedilol (COREG) 3.125 MG tablet Take 1 tablet (3.125 mg total) by mouth 2 (two) times daily with a meal. 60 tablet 3  . atorvastatin (LIPITOR) 80 MG tablet Take 1 tablet (80 mg total) by mouth daily at 6 PM. (Patient not taking: Reported on 04/08/2019) 30 tablet 2  . losartan (COZAAR) 25 MG tablet Take 1 tablet (25 mg total) by mouth daily. (Patient not taking: Reported on 04/08/2019) 30 tablet 2   No current facility-administered medications for this encounter.     Allergies:   Patient has no known allergies.   Social History:  The patient  reports that he has never smoked. He has never used smokeless tobacco. He reports current alcohol use. He reports that he does not use drugs.   Family History:  The patient's family history includes Heart attack in an other family member.   ROS:  Please see the history of present illness.   All other systems are personally reviewed and negative.   Exam:  (Video/Tele Health Call; Exam is subjective and or/visual.) General:  Speaks in full sentences. No resp difficulty. Lungs: Normal respiratory effort with conversation.  Abdomen: +distended per patient report Extremities: Pt denies edema. Neuro: Alert & oriented x 3.   Recent Labs: 02/25/2019: B Natriuretic Peptide 135.4; BUN 7; Creatinine, Ser 0.98; Hemoglobin 15.8; Platelets 159; Potassium 4.1; Sodium 138  Personally reviewed   Wt Readings from Last 3 Encounters:  02/25/19 73.3 kg (161 lb 9.6 oz)  09/10/18 66.2 kg (146 lb)  03/26/18 71.8 kg (158 lb 6 oz)      ASSESSMENT AND PLAN:  1. Chronic systolic CHF with ICM -  TTE 11/30/17 25-30% RV normal.   - Echo 03/2018: EF 35-40%, RV normal - NYHA III symptoms chronically. - Volume status sounds elevated. Up 7 lbs on his scale.  - Take lasix 20 mg PRN with 20 meq K. Asked him to take today and tomorrow and then only as needed.  - Restart losartan 25 mg daily. Has previously failed Entresto with orthostasis, syncope, and AKI - Hold off on spiro with noncompliance.  - Continue coreg 3.125 mg BID   - Reinforced fluid restriction to < 2 L daily, sodium restriction to less than 2000 mg daily, and the importance of daily weights.   - Plan to repeat echo after med titration. - [place] eligible for [place]. I will see if we could get one of the paramedics to drop off his medications and a pill box. He says he has to pay for transportation to go to the pharmacy and does not have money for  this or his meds.  2. CAD s/p CABG 12/07/2017 - Has chronic chest pain since CABG, none exertional.  - Continue ASA. Refill atorvastatin.    3. VF arrest in setting of STEMI - Stable  4. ETOH abuse - Encouraged complete cessation. - Drinks 2-3 beers daily.   5. Mild orthostatic hypotension - None recent  6. Noncompliance - Ongoing.    COVID screen The patient does not have any symptoms that suggest any further testing/ screening at this time.  Social distancing reinforced today. Has food stamps. Brother is getting food for him.   Relevant cardiac medications were reviewed at length with the patient today.   The patient does not have concerns regarding their medications at this time.   The following changes were made today:  Restart losartan and atorvastatin. Will send in a few lasix tablets to take PRN. I asked him to take today and tomorrow. I will have to see if our CSW and paramedic can help get his medications to him.   Recommended follow-up: 1 week by telephone  Today, I have spent 21 minutes with the patient with  telehealth technology discussing the above issues .    Signed, Alford Highland, NP  04/08/2019 9:58 AM  [facility] [place] [address] [facility] [place] [address] [phone] (office) [phone] (fax)

## 2019-04-16 ENCOUNTER — Ambulatory Visit (HOSPITAL_COMMUNITY)
Admission: RE | Admit: 2019-04-16 | Discharge: 2019-04-16 | Disposition: A | Discharge status: Home / Self Care | Payer: Medicaid Other | Source: Ambulatory Visit | Attending: Adult Health | Admitting: Adult Health

## 2019-04-16 ENCOUNTER — Encounter (HOSPITAL_COMMUNITY): Payer: Self-pay

## 2019-04-16 ENCOUNTER — Telehealth (HOSPITAL_COMMUNITY): Payer: Self-pay | Admitting: Licensed Clinical Social Worker

## 2019-04-16 ENCOUNTER — Other Ambulatory Visit: Payer: Self-pay

## 2019-04-16 VITALS — Wt 175.0 lb

## 2019-04-16 DIAGNOSIS — Z91199 Patient's noncompliance with other medical treatment and regimen due to unspecified reason: Secondary | ICD-10-CM

## 2019-04-16 DIAGNOSIS — I5022 Chronic systolic (congestive) heart failure: Secondary | ICD-10-CM | POA: Diagnosis not present

## 2019-04-16 DIAGNOSIS — Z9119 Patient's noncompliance with other medical treatment and regimen: Secondary | ICD-10-CM

## 2019-04-16 DIAGNOSIS — F1010 Alcohol abuse, uncomplicated: Secondary | ICD-10-CM

## 2019-04-16 MED ORDER — FUROSEMIDE 20 MG PO TABS: 20.0000 mg | ORAL_TABLET | Freq: Every day | ORAL | 6 refills | Status: DC

## 2019-04-16 MED ORDER — POTASSIUM CHLORIDE CRYS ER 20 MEQ PO TBCR: 20.0000 meq | EXTENDED_RELEASE_TABLET | Freq: Every day | ORAL | 6 refills | Status: DC

## 2019-04-16 NOTE — Addendum Note (Signed)
Encounter addended by: Marisa Hua, RN on: 04/16/2019 2:31 PM  Actions taken: Pharmacy for encounter modified, Diagnosis association updated, Order list changed, Clinical Note Signed

## 2019-04-16 NOTE — Patient Instructions (Addendum)
Start lasix 20 mg (1 tab) daily + Potassium 20 meq (1 tab) daily.   Labs next week We will only contact you if something comes back abnormal or we need to make some changes. Otherwise no news is good news!  Follow up in 4 weeks telephone.   Follow up in 3 months with Dr Leory Plowman and an ECHO.

## 2019-04-16 NOTE — Telephone Encounter (Signed)
CSW consulted to see if community paramedic could help take patient his lasix and potassium.  Community paramedic able to do so tomorrow.  CSW called [facility] pharmacy to request medications be filled- they reported they are currently out of potassium but they will order more and should have it by tomorrow morning.  CSW will continue to follow and assist as needed  Burna Sis, LCSW Clinical Social Worker [facility] Desk#: [phone] Cell#: [phone]

## 2019-04-16 NOTE — Telephone Encounter (Signed)
CSW consulted to help set up transportation to patients LAB appointment for next week.  Appt originally for Wednesday but pt utilizes [facility] and they only transport to [place] on tuesdays- CSW requested appt be changed to Tuesday.  CSW called [facility] to schedule ride- left message with scheduling line  CSW will continue to follow and assist as needed  Burna Sis, LCSW Clinical Social Worker [facility] Desk#: [phone] Cell#: [phone]

## 2019-04-16 NOTE — Progress Notes (Signed)
Heart Failure TeleHealth Note  Due to national recommendations of social distancing due to COVID 19, telehealth visit is felt to be most appropriate for this patient at this time.  I discussed the limitations, risks, security and privacy concerns of performing an evaluation and management service by telephone and the availability of in person appointments. I also discussed with the patient that there may be a patient responsible charge related to this service. The patient expressed understanding and agreed to proceed.   ID:  [name], [name] Mar 20, 1961, MRN [id number]  Location: Home  Provider location: [address], [place] Type of Visit: Established patient   PCP:  Patient, No Pcp Per  Cardiologist:  No primary care provider on file. Primary HF: Dr Gala Romney  Chief Complaint: HF follow up   History of Present Illness: [name] [name] [facility] a 58 y.o.malewith h/o ETOH, VF Arrest, CAD s/p CABG 12/07/17, and ICM with EF 25-30%.  Admitted 12/6 - 12/14/17 with V-Fib arrest while chopping wood. Received CPR in the field. R/LHC with severe 3vD as below and cardiogenic shock requiring IABP. Required milrinone with continued shock. Pt improved and IABP removed 12/10, but pt had recurrent VF arrest so IABP replaced. Stabilized and underwent CABG 12/07/17. Tolerated gradual wean of meds and extubation with no furthe events. HF medications titrated as tolerated.  Admitted 12/27 ->12/28 with syncope and AKI thought to be 2/2 volume depletion on lasix, spiro, and Entresto. CT negative for ICH. Improved rapidly with holding meds and gentle IVF. Entresto stopped. Spiro cut back and switched to low dose losartan.  He presents via Special educational needs teacher for a telehealth visit today. Last week he was out of most of his medications. 90 day supply was ordered and delivered to him by HF paramedic, who also filled his pill box. Overall feeling ok. SOB with exertion. + Bendopnea.  Appetite  ok. Eating high salt diet. Says he has been eating sausage, pizza, and japnese takeout. No fever or chills. Weight at home 175 pounds. Taking all medications. He has food stamps. Drinking 3 beers a day. Having a hard time getting disability. Lives with his brother. Uses [facility].      Pt denies symptoms of cough, fevers, chills, or new SOB worrisome for COVID 19.   Past Medical History:  Diagnosis Date  . Arthritis    "hands; maybe my toes" (12/20/2017)  . Cardiac arrest (HCC) 11/29/2017   hx VF arrest/notes 12/20/2017  . Coronary artery disease   . GERD (gastroesophageal reflux disease)   . H/O ETOH abuse    /notes 12/20/2017  . High cholesterol   . Hypertension   . Seizures (HCC) ~ 2016; ~ 2017   "I've had a couple; I was at work" (12/20/2017)  . Syncope and collapse    "last few days" (12/20/2017)   Past Surgical History:  Procedure Laterality Date  . CARDIAC CATHETERIZATION    . CORONARY ARTERY BYPASS GRAFT N/A 12/07/2017   Procedure: CORONARY ARTERY BYPASS GRAFTING (CABG) times four using left internal mammary artery and right saphenous vein using endoscope for harvest.;  Surgeon: [name], MD;  Location: [facility] OR;  Service: Open Heart Surgery;  Laterality: N/A;  . IABP INSERTION N/A 11/29/2017   Procedure: IABP Insertion;  Surgeon: [name], MD;  Location: [facility] INVASIVE CV LAB;  Service: Cardiovascular;  Laterality: N/A;  . IABP INSERTION N/A 12/03/2017   Procedure: IABP INSERTION;  Surgeon: [name], MD;  Location: [facility] INVASIVE CV  LAB;  Service: Cardiovascular;  Laterality: N/A;  . LEFT HEART CATH AND CORONARY ANGIOGRAPHY N/A 11/29/2017   Procedure: LEFT HEART CATH AND CORONARY ANGIOGRAPHY;  Surgeon: [name], MD;  Location: [facility] INVASIVE CV LAB;  Service: Cardiovascular;  Laterality: N/A;  . RIGHT HEART CATH N/A 11/29/2017   Procedure: RIGHT HEART CATH;  Surgeon: [name], MD;  Location: [facility] INVASIVE CV LAB;  Service: Cardiovascular;  Laterality:  N/A;  . TEE WITHOUT CARDIOVERSION N/A 12/07/2017   Procedure: TRANSESOPHAGEAL ECHOCARDIOGRAM (TEE);  Surgeon: Donata Clay, Theron Arista, MD;  Location: [facility] OR;  Service: Open Heart Surgery;  Laterality: N/A;  . TONSILLECTOMY AND ADENOIDECTOMY  ~ 1972     Current Outpatient Medications  Medication Sig Dispense Refill  . ASPIRIN ADULT LOW STRENGTH 81 MG EC tablet Take 1 tablet (81 mg total) by mouth daily. 90 tablet 1  . atorvastatin (LIPITOR) 80 MG tablet Take 1 tablet (80 mg total) by mouth daily at 6 PM. 90 tablet 1  . carvedilol (COREG) 3.125 MG tablet Take 1 tablet (3.125 mg total) by mouth 2 (two) times daily with a meal. 180 tablet 1  . furosemide (LASIX) 20 MG tablet Take 1 tablet (20 mg total) by mouth as needed. Take today and tomorrow, then as needed with swelling or SOB 6 tablet 0  . losartan (COZAAR) 25 MG tablet Take 1 tablet (25 mg total) by mouth daily. 90 tablet 1  . potassium chloride SA (K-DUR,KLOR-CON) 20 MEQ tablet Take 1 tablet (20 mEq total) by mouth as needed. Take 1 tab when you take a dose of lasix as needed 6 tablet 0   No current facility-administered medications for this encounter.     Wt Readings from Last 3 Encounters:  04/16/19 79.4 kg (175 lb)  02/25/19 73.3 kg (161 lb 9.6 oz)  09/10/18 66.2 kg (146 lb)   Allergies:   Patient has no known allergies.   Social History:  The patient  reports that he has never smoked. He has never used smokeless tobacco. He reports current alcohol use. He reports that he does not use drugs.   Family History:  The patient's family history includes Heart attack in an other family member.   ROS:  Please see the history of present illness.   All other systems are personally reviewed and negative.   Exam:  Tele Health Call; Exam is subjective  General: Speaks in full sentences. No resp difficulty. Lungs: Normal respiratory effort with conversation.  Abdomen: +distended per patient report Extremities: Pt denies edema. Neuro: Alert &  oriented x 3.   Recent Labs: 02/25/2019: B Natriuretic Peptide 135.4; BUN 7; Creatinine, Ser 0.98; Hemoglobin 15.8; Platelets 159; Potassium 4.1; Sodium 138  Personally reviewed   Wt Readings from Last 3 Encounters:  04/16/19 79.4 kg (175 lb)  02/25/19 73.3 kg (161 lb 9.6 oz)  09/10/18 66.2 kg (146 lb)      ASSESSMENT AND PLAN:  1. Chronic systolic CHF with ICM - TTE 11/30/17 25-30% RV normal.  - Echo 03/2018: EF 40-45%  RV normal. He needs another ECHO and a BMET  - NYHAIII symptoms chronically. - Volume status sounds elevated. Start lasix 20 mg daily + 20 meq potassiumdaiy   -Continue losartan 25 mg daily. Has previously failed Entresto with orthostasis, syncope, and AKI - Hold off on spiro with noncompliance.  -Continue coreg 3.125 mg BID - Not eligible for paramedicine because he is outside of [facility].  - Will ask HF Paramedic to help deliver  lasix/ potassium    2. CAD s/p CABG 12/07/2017 -Has chronic chest pain since CABG, none exertional.  - Continue ASA and atorvastatin.   3. VF arrest in setting of STEMI -Stable.  4. ETOH abuse - Encouraged complete cessation. - Drinking 2-3 beers daily.   5. Mild orthostatic hypotension - None recent. No change.   6. Noncompliance Doing ok with medications.    COVID screen The patient does not have any symptoms that suggest any further testing/ screening at this time.  Social distancing reinforced today.  Patient Risk: After full review of this patients clinical status, I feel that they are at moderate risk for cardiac decompensation at this time.  Orders/Follow up: Start lasix 20 mg daily + 20 meq potassium daily. Needs BMET next week. He will need [employer] for transportation. Follow up 4 weeks telephone visit.  Follow up in 3 months with Dr Leory PlowmanBenismhon and an ECHO.   Signed, Kieanna Rollo NP-C  1:43 PM  Today, I have spent  15 minutes with the patient with telehealth technology discussing medications changes.    [facility] [address] [facility] [place] [address] [phone] (office) [phone] (fax)

## 2019-04-16 NOTE — Progress Notes (Signed)
Spoke with patient, discussed visit summary.  Rx sent to [facility].  Spoke with Tawanna Sat, he will pick up meds tomorrow for patient. Pt verbalized understanding.

## 2019-04-22 ENCOUNTER — Other Ambulatory Visit (HOSPITAL_COMMUNITY): Payer: Medicaid Other

## 2019-04-23 ENCOUNTER — Other Ambulatory Visit (HOSPITAL_COMMUNITY): Payer: Medicaid Other

## 2019-04-23 NOTE — Addendum Note (Signed)
Encounter addended by: Noralee Space, RN on: 04/23/2019 12:19 PM  Actions taken: Order list changed, Diagnosis association updated

## 2019-04-24 ENCOUNTER — Other Ambulatory Visit: Payer: Self-pay

## 2019-04-24 ENCOUNTER — Telehealth (HOSPITAL_COMMUNITY): Payer: Self-pay | Admitting: Adult Health

## 2019-04-24 ENCOUNTER — Ambulatory Visit (HOSPITAL_COMMUNITY)
Admission: RE | Admit: 2019-04-24 | Discharge: 2019-04-24 | Disposition: A | Discharge status: Home / Self Care | Payer: Medicaid Other | Source: Ambulatory Visit | Attending: Internal Medicine | Admitting: Internal Medicine

## 2019-04-24 DIAGNOSIS — I5022 Chronic systolic (congestive) heart failure: Secondary | ICD-10-CM | POA: Insufficient documentation

## 2019-04-24 LAB — BASIC METABOLIC PANEL
Anion gap: 15 (ref 5–15)
BUN: 10 mg/dL (ref 6–20)
CO2: 18 mmol/L — ABNORMAL LOW (ref 22–32)
Calcium: 9 mg/dL (ref 8.9–10.3)
Chloride: 107 mmol/L (ref 98–111)
Creatinine, Ser: 0.86 mg/dL (ref 0.61–1.24)
GFR calc Af Amer: >60 mL/min (ref >60)
GFR calc non Af Amer: >60 mL/min (ref >60)
Glucose, Bld: 82 mg/dL (ref 70–99)
Potassium: 4.3 mmol/L (ref 3.5–5.1)
Sodium: 140 mmol/L (ref 135–145)

## 2019-04-24 MED ORDER — FUROSEMIDE 20 MG PO TABS: 20.0000 mg | ORAL_TABLET | ORAL | 6 refills | Status: DC

## 2019-04-24 MED FILL — FUROSEMIDE 20 MG TABS: 20 | 30 days supply | Qty: 30 | Fill #0

## 2019-04-24 NOTE — Telephone Encounter (Signed)
   Received call regarding lasix   He has been taking extra lasix for swelling in his legs. Today I will change lasix to 20 mg every other day and as needed for swelling.  an extra 20 mg as needed.   Amy Clegg NP-C  3:53 PM

## 2019-04-24 NOTE — Progress Notes (Signed)
Sedonia Small       DOB: Oct 04, 1961  Purpose of Visit: Home Visit ReDS reading, vitals HF provider:  Medications: Is the patient taking all medications listed on MAR from Epic? Yes List any medications that are not being taken correctly: no  List any medication refills needed: Lasix  Is the patient able to pick up medications? No.  He states paramedicine usually brings him his medications.    Vitals: BP:  140/108    HR:90    Oxygen:  97% RA Weight:        Physical Exam:  Lung sounds: clear/crackles/wheezing  Heart sounds: regular/irregular  Peripheral edema: yes/no  Wounds: yes/no  Location:  Any patient concerns?  He states he feels swollen, mainly in his abdomen and has intermittent chest tightness/pain.  He has used all his prn Lasix.    ReDS Vest/Clip Reading:  38%  Rhythm Strip:  Is Home Health recommended? No If yes, state reason:   Reported results to Amy, NP at HF clinic who would like Mr. Godeaux to take Lasix 20mg  today and tomorrow then prn as Rx'd.  She will call the paramedicine people to see if they can bring him the lasix tomorrow.  Also asked if he needed any assistance with meals which he says he does.  Will forward his information to [facility] to set him up with meal delivery service.  He knows to call the HF clinic if he has any questions or concerns.   Rose Fillers, RN 04/24/19

## 2019-05-12 ENCOUNTER — Telehealth (HOSPITAL_COMMUNITY): Payer: Self-pay | Admitting: Licensed Clinical Social Worker

## 2019-05-12 NOTE — Telephone Encounter (Signed)
Pt called CSW and informed of current health concerns.  Pt worried he is gaining weight but was 175 yesterday morning and when CSW had him weigh he was 176 this afternoon.  Pt also stating his feet are somewhat swollen and feel funny.  Pt reports shortness of breath and inability to lay flat due to breathing issues.  CSW called and informed APP of pt current symptoms- APP feels pt needs in person appt  CSW will continue to follow and assist as needed  Burna Sis, LCSW Clinical Social Worker [facility] Desk#: [phone] Cell#: [phone]

## 2019-05-14 ENCOUNTER — Telehealth (HOSPITAL_COMMUNITY): Payer: Self-pay

## 2019-05-14 ENCOUNTER — Other Ambulatory Visit: Payer: Self-pay

## 2019-05-14 ENCOUNTER — Ambulatory Visit (HOSPITAL_COMMUNITY)
Admission: RE | Admit: 2019-05-14 | Discharge: 2019-05-14 | Disposition: A | Discharge status: Home / Self Care | Payer: Medicaid Other | Source: Ambulatory Visit | Attending: Cardiology | Admitting: Cardiology

## 2019-05-14 ENCOUNTER — Telehealth (HOSPITAL_COMMUNITY): Payer: Self-pay | Admitting: Licensed Clinical Social Worker

## 2019-05-14 ENCOUNTER — Encounter (HOSPITAL_COMMUNITY): Payer: Self-pay

## 2019-05-14 VITALS — Wt 171.0 lb

## 2019-05-14 DIAGNOSIS — I5022 Chronic systolic (congestive) heart failure: Secondary | ICD-10-CM | POA: Diagnosis not present

## 2019-05-14 DIAGNOSIS — Z91199 Patient's noncompliance with other medical treatment and regimen due to unspecified reason: Secondary | ICD-10-CM

## 2019-05-14 DIAGNOSIS — I2510 Atherosclerotic heart disease of native coronary artery without angina pectoris: Secondary | ICD-10-CM

## 2019-05-14 DIAGNOSIS — F1010 Alcohol abuse, uncomplicated: Secondary | ICD-10-CM

## 2019-05-14 DIAGNOSIS — Z9119 Patient's noncompliance with other medical treatment and regimen: Secondary | ICD-10-CM

## 2019-05-14 MED ORDER — FUROSEMIDE 40 MG PO TABS: 40.0000 mg | ORAL_TABLET | Freq: Every day | ORAL | 3 refills | Status: DC

## 2019-05-14 MED FILL — FUROSEMIDE 40 MG TAB: 40 | 30 days supply | Qty: 60 | Fill #0

## 2019-05-14 NOTE — Telephone Encounter (Signed)
Reviewed AVS:   Start lasix 40 mg daily and extra 40 mg as needed. Lets send in 60 tablets. Please ask zach if he will take him the lasix? /ordered and reached out to [facility]. Magdalene Molly placed   Follow up next Tuesday in the clinic. He is going to contact [employer] for transportation. Marland Kitchen Shirline Frees 26 May @ 11

## 2019-05-14 NOTE — Progress Notes (Signed)
Heart Failure TeleHealth Note  Due to national recommendations of social distancing due to COVID 19, telehealth visit is felt to be most appropriate for this patient at this time.  I discussed the limitations, risks, security and privacy concerns of performing an evaluation and management service by telephone and the availability of in person appointments. I also discussed with the patient that there may be a patient responsible charge related to this service. The patient expressed understanding and agreed to proceed.   ID:  [name], Trompeter 12/30/1960, MRN [id number]  Location: Home  Provider location: [address] [place] Type of Visit: Established patient   PCP:  Patient, No Pcp Per  Cardiologist:  No primary care provider on file. Primary HF: Dr Gala Romney  Chief Complaint:Heart Failure    History of Present Illness: [name] [facility] a 58 y.o.malewith h/o ETOH, VF Arrest, CAD s/p CABG 12/07/17, and ICM with EF 25-30%.  Admitted 12/6 - 12/14/17 with V-Fib arrest while chopping wood. Received CPR in the field. R/LHC with severe 3vD as below and cardiogenic shock requiring IABP. Required milrinone with continued shock. Pt improved and IABP removed 12/10, but pt had recurrent VF arrest so IABP replaced. Stabilized and underwent CABG 12/07/17. Tolerated gradual wean of meds and extubation with no furthe events. HF medications titrated as tolerated.  Admitted 12/27 ->12/28 with syncope and AKI thought to be 2/2 volume depletion on lasix, spiro, and Entresto. CT negative for ICH. Improved rapidly with holding meds and gentle IVF. Entresto stopped. Spiro cut back and switched to low dose losartan.  Patient presents via audio conferencing for a telehealth visit today. Last visit he was instructed to take lasix and potassium daily. This was delivered by HF paramedic. This was changed back to PRN 1 week later after visit with [facility]. He has been set up for meal  delivery. He called our CSW on Monday with SOB and orthopnea. Advised to be seen in clinic, but he has no transportation.  Overall feeling fair.  Remains SOB with exertion. Increased leg edema. Denies PND/Orthopnea. No chest pain. Appetite ok. No fever or chills. Weight at home 171 pounds. Taking all medications but he ran out of lasix few weeks ago. He is not working. He has not transportation.   Pt denies symptoms of cough, fevers, chills, or new SOB worrisome for COVID 19.    Past Medical History:  Diagnosis Date  . Arthritis    "hands; maybe my toes" (12/20/2017)  . Cardiac arrest (HCC) 11/29/2017   hx VF arrest/notes 12/20/2017  . Coronary artery disease   . GERD (gastroesophageal reflux disease)   . H/O ETOH abuse    /notes 12/20/2017  . High cholesterol   . Hypertension   . Seizures (HCC) ~ 2016; ~ 2017   "I've had a couple; I was at work" (12/20/2017)  . Syncope and collapse    "last few days" (12/20/2017)   Past Surgical History:  Procedure Laterality Date  . CARDIAC CATHETERIZATION    . CORONARY ARTERY BYPASS GRAFT N/A 12/07/2017   Procedure: CORONARY ARTERY BYPASS GRAFTING (CABG) times four using left internal mammary artery and right saphenous vein using endoscope for harvest.;  Surgeon: [name], MD;  Location[facility];  Service: Open Heart Surgery;  Laterality: N/A;  . IABP INSERTION N/A 11/29/2017   Procedure: IABP Insertion;  Surgeon: [name], MD;  Location: [facility] INVASIVE CV LAB;  Service: Cardiovascular;  Laterality: N/A;  . IABP INSERTION  N/A 12/03/2017   Procedure: IABP INSERTION;  Surgeon: Dolores PattyBensimhon, [name], MD;  Location: [facility] INVASIVE CV LAB;  Service: Cardiovascular;  Laterality: N/A;  . LEFT HEART CATH AND CORONARY ANGIOGRAPHY N/A 11/29/2017   Procedure: LEFT HEART CATH AND CORONARY ANGIOGRAPHY;  Surgeon: [name], [name], MD;  Location: [facility] INVASIVE CV LAB;  Service: Cardiovascular;  Laterality: N/A;  . RIGHT HEART CATH N/A 11/29/2017   Procedure:  RIGHT HEART CATH;  Surgeon: Dolores PattyBensimhon, [name], MD;  Location: [facility] INVASIVE CV LAB;  Service: Cardiovascular;  Laterality: N/A;  . TEE WITHOUT CARDIOVERSION N/A 12/07/2017   Procedure: TRANSESOPHAGEAL ECHOCARDIOGRAM (TEE);  Surgeon: [name], [name], MD;  [facility];  Service: Open Heart Surgery;  Laterality: N/A;  . TONSILLECTOMY AND ADENOIDECTOMY  ~ 1972     Current Outpatient Medications  Medication Sig Dispense Refill  . ASPIRIN ADULT LOW STRENGTH 81 MG EC tablet Take 1 tablet (81 mg total) by mouth daily. 90 tablet 1  . atorvastatin (LIPITOR) 80 MG tablet Take 1 tablet (80 mg total) by mouth daily at 6 PM. 90 tablet 1  . carvedilol (COREG) 3.125 MG tablet Take 1 tablet (3.125 mg total) by mouth 2 (two) times daily with a meal. 180 tablet 1  . losartan (COZAAR) 25 MG tablet Take 1 tablet (25 mg total) by mouth daily. 90 tablet 1  . potassium chloride SA (K-DUR) 20 MEQ tablet Take 1 tablet (20 mEq total) by mouth daily. Take 1 tab when you take a dose of lasix as needed 30 tablet 6  . furosemide (LASIX) 20 MG tablet Take 1 tablet (20 mg total) by mouth every other day. And as needed. (Patient not taking: Reported on 05/14/2019) 30 tablet 6   No current facility-administered medications for this encounter.     Allergies:   Patient has no known allergies.   Social History:  The patient  reports that he has never smoked. He has never used smokeless tobacco. He reports current alcohol use. He reports that he does not use drugs.   Family History:  The patient's family history includes Heart attack in an other family member.   ROS:  Please see the history of present illness.   All other systems are personally reviewed and negative.    Exam:  Tele Health Call; Exam is subjective  General:  Speaks in full sentences. No resp difficulty. Lungs: Normal respiratory effort with conversation.  Abdomen: No distension per patient report Extremities: Pt denies edema. Neuro: Alert & oriented x  3.   Recent Labs: 02/25/2019: B Natriuretic Peptide 135.4; Hemoglobin 15.8; Platelets 159 04/24/2019: BUN 10; Creatinine, Ser 0.86; Potassium 4.3; Sodium 140  Personally reviewed   Wt Readings from Last 3 Encounters:  05/14/19 77.6 kg (171 lb)  04/24/19 78.5 kg (173 lb)  04/16/19 79.4 kg (175 lb)      ASSESSMENT AND PLAN:  1. Chronic systolic CHF with ICM - TTE 11/30/17 25-30% RV normal.  - Echo 03/2018: EF 40-45%  RV normal. He needs another ECHO and a BMET  - NYHAIII - Volume status elevated. Start lasix 40 mg daily with extra 40 mg as needed.   -Continue losartan 25 mg daily. Has previously failed Entresto with orthostasis, syncope, and AKI - Hold off on spiro with noncompliance.  -Continuecoreg 3.125 mg BID - [place] - HF paramedic has helped us deliver medications in the past.   2. CAD s/p CABG 12/07/2017 -Has chronic chest  pain since CABG, none exertional. -No chest pain.  -ContinueASA and atorvastatin.  3. VF arrest in setting of STEMI -Stable.  4. ETOH abuse - Needs to stop drinking alcohol;.   5. Mild orthostatic hypotension -None recent. No change.   6. Noncompliance Doing ok with medications.   COVID screen The patient does not have any symptoms that suggest any further testing/ screening at this time.  Social distancing reinforced today.  Patient Risk: After full review of this patients clinical status, I feel that they are at moderate risk for cardiac decompensation at this time.  Orders/Follow up: Start lasix 40 mg daily and extra 40 mg as needed. Check BMET. Follow up next Tuesday in the clinic. He is going to contact [employer] for transportation.   Today, I have spent 20  minutes with the patient with telehealth technology discussing the above.     Waneta Martins, NP  05/14/2019 10:12 AM   [facility] [address] Heart and Vascular [place] [address] [phone] (office) [phone] (fax)

## 2019-05-14 NOTE — Addendum Note (Signed)
Encounter addended by: Shea Stakes, RN on: 05/14/2019 10:36 AM  Actions taken: Diagnosis association updated, Pharmacy for encounter modified, Order list changed

## 2019-05-14 NOTE — Telephone Encounter (Signed)
CSW informed that physician would like patient to come for in person appointment next week to assess current health concerns.   CSW called [employer] which is the transport service the patient normally uses but they are still not transporting to [place] due to [place] Virus concerns.  CSW called pt to inform and he is positive that no family or friend would be able to provide transport.  CSW continuing to evaluate options for transportation to get the patient an in person assessment  CSW will continue to follow and assist as needed  Burna Sis, LCSW Clinical Social Worker [facility] Desk#: [phone] Cell#: [phone]

## 2019-05-15 ENCOUNTER — Other Ambulatory Visit (HOSPITAL_COMMUNITY): Payer: Self-pay

## 2019-05-15 NOTE — Progress Notes (Signed)
I saw [name] today, at the request of the HF clinic, to deliver Lasix. His weight had increased significantly and he was without lasix to help remove fluid. He did not require any further assistance. I will follow up at the request of the HF clinic moving forward.   [name], EMT

## 2019-05-16 ENCOUNTER — Telehealth (HOSPITAL_COMMUNITY): Payer: Self-pay | Admitting: Licensed Clinical Social Worker

## 2019-05-16 NOTE — Telephone Encounter (Signed)
Transportation set up for Tuesday at 8am to bring patient to his appointment in the Heart Failure Clinic.  Patient informed and will be ready for transport at 8am.  CSW will continue to follow and assist as needed  Burna Sis, LCSW Clinical Social Worker [facility] Desk#: [phone] Cell#: [phone]

## 2019-05-20 ENCOUNTER — Ambulatory Visit (HOSPITAL_COMMUNITY)
Admission: RE | Admit: 2019-05-20 | Discharge: 2019-05-20 | Disposition: A | Discharge status: Home / Self Care | Payer: Medicaid Other | Source: Ambulatory Visit | Attending: Internal Medicine | Admitting: Internal Medicine

## 2019-05-20 ENCOUNTER — Telehealth (HOSPITAL_COMMUNITY): Payer: Self-pay | Admitting: Licensed Clinical Social Worker

## 2019-05-20 ENCOUNTER — Encounter (HOSPITAL_COMMUNITY): Payer: Self-pay

## 2019-05-20 ENCOUNTER — Other Ambulatory Visit: Payer: Self-pay

## 2019-05-20 VITALS — BP 142/94 | HR 99 | Wt 172.0 lb

## 2019-05-20 DIAGNOSIS — Z79899 Other long term (current) drug therapy: Secondary | ICD-10-CM | POA: Insufficient documentation

## 2019-05-20 DIAGNOSIS — Z8674 Personal history of sudden cardiac arrest: Secondary | ICD-10-CM | POA: Diagnosis not present

## 2019-05-20 DIAGNOSIS — K219 Gastro-esophageal reflux disease without esophagitis: Secondary | ICD-10-CM | POA: Diagnosis not present

## 2019-05-20 DIAGNOSIS — I252 Old myocardial infarction: Secondary | ICD-10-CM | POA: Insufficient documentation

## 2019-05-20 DIAGNOSIS — I5022 Chronic systolic (congestive) heart failure: Secondary | ICD-10-CM | POA: Diagnosis not present

## 2019-05-20 DIAGNOSIS — Z951 Presence of aortocoronary bypass graft: Secondary | ICD-10-CM | POA: Diagnosis not present

## 2019-05-20 DIAGNOSIS — I110 Hypertensive heart disease with heart failure: Secondary | ICD-10-CM | POA: Diagnosis not present

## 2019-05-20 DIAGNOSIS — Z9119 Patient's noncompliance with other medical treatment and regimen: Secondary | ICD-10-CM | POA: Insufficient documentation

## 2019-05-20 DIAGNOSIS — F1010 Alcohol abuse, uncomplicated: Secondary | ICD-10-CM | POA: Insufficient documentation

## 2019-05-20 DIAGNOSIS — I2510 Atherosclerotic heart disease of native coronary artery without angina pectoris: Secondary | ICD-10-CM | POA: Insufficient documentation

## 2019-05-20 DIAGNOSIS — E7800 Pure hypercholesterolemia, unspecified: Secondary | ICD-10-CM | POA: Insufficient documentation

## 2019-05-20 DIAGNOSIS — M1990 Unspecified osteoarthritis, unspecified site: Secondary | ICD-10-CM | POA: Insufficient documentation

## 2019-05-20 DIAGNOSIS — Z7982 Long term (current) use of aspirin: Secondary | ICD-10-CM | POA: Insufficient documentation

## 2019-05-20 DIAGNOSIS — R079 Chest pain, unspecified: Secondary | ICD-10-CM | POA: Diagnosis not present

## 2019-05-20 DIAGNOSIS — I255 Ischemic cardiomyopathy: Secondary | ICD-10-CM | POA: Diagnosis not present

## 2019-05-20 LAB — BASIC METABOLIC PANEL
Anion gap: 14 (ref 5–15)
BUN: 16 mg/dL (ref 6–20)
CO2: 21 mmol/L — ABNORMAL LOW (ref 22–32)
Calcium: 9.4 mg/dL (ref 8.9–10.3)
Chloride: 100 mmol/L (ref 98–111)
Creatinine, Ser: 0.85 mg/dL (ref 0.61–1.24)
GFR calc Af Amer: >60 mL/min (ref >60)
GFR calc non Af Amer: >60 mL/min (ref >60)
Glucose, Bld: 106 mg/dL — ABNORMAL HIGH (ref 70–99)
Potassium: 3.9 mmol/L (ref 3.5–5.1)
Sodium: 135 mmol/L (ref 135–145)

## 2019-05-20 LAB — BRAIN NATRIURETIC PEPTIDE: B Natriuretic Peptide: 104.2 pg/mL — ABNORMAL HIGH (ref 0.0–100.0)

## 2019-05-20 MED ORDER — FUROSEMIDE 80 MG PO TABS: 80.0000 mg | ORAL_TABLET | Freq: Every day | ORAL | 1 refills | Status: DC

## 2019-05-20 NOTE — Progress Notes (Signed)
PCP: None  Primary Cardiologist: Dr Gala Romney   HPI: [name] [facility] a 58 y.o.malewith h/o ETOH, VF Arrest, CAD s/p CABG 12/07/17, and ICM with EF 25-30%.  Admitted 12/6 - 12/14/17 with V-Fib arrest while chopping wood. Received CPR in the field. R/LHC with severe 3vD as below and cardiogenic shock requiring IABP. Required milrinone with continued shock. Pt improved and IABP removed 12/10, but pt had recurrent VF arrest so IABP replaced. Stabilized and underwent CABG 12/07/17. Tolerated gradual wean of meds and extubation with no furthe events. HF medications titrated as tolerated.  Admitted 12/27 ->12/28 with syncope and AKI thought to be 2/2 volume depletion on lasix, spiro, and Entresto. CT negative for ICH. Improved rapidly with holding meds and gentle IVF. Entresto stopped. Spiro cut back and switched to low dose losartan.  Today he returns for HF follow up. Overall feeling fair. Complaining of increase SOB with exertion. + Orthopnea. Denies PND. Appetite ok. No fever or chills. Weight at home  170-172 pounds. Drinking 12 beers a day. Taking all medications. Has trouble with transportation. Has a hard time paying for his medications.   ROS: All systems negative except as listed in HPI, PMH and Problem List.  SH:  Social History   Socioeconomic History  . Marital status: Divorced    Spouse name: Not on file  . Number of children: Not on file  . Years of education: Not on file  . Highest education level: Not on file  Occupational History  . Not on file  Social Needs  . Financial resource strain: Not on file  . Food insecurity:    Worry: Not on file    Inability: Not on file  . Transportation needs:    Medical: Not on file    Non-medical: Not on file  Tobacco Use  . Smoking status: Never Smoker  . Smokeless tobacco: Never Used  Substance and Sexual Activity  . Alcohol use: Yes    Comment: 12/20/2017 "I quit drinking after I had the heart attack"  . Drug use: No   . Sexual activity: Not Currently  Lifestyle  . Physical activity:    Days per week: Not on file    Minutes per session: Not on file  . Stress: Not on file  Relationships  . Social connections:    Talks on phone: Not on file    Gets together: Not on file    Attends religious service: Not on file    Active member of club or organization: Not on file    Attends meetings of clubs or organizations: Not on file    Relationship status: Not on file  . Intimate partner violence:    Fear of current or ex partner: Not on file    Emotionally abused: Not on file    Physically abused: Not on file    Forced sexual activity: Not on file  Other Topics Concern  . Not on file  Social History Narrative  . Not on file    FH:  Family History  Problem Relation Age of Onset  . Heart attack Other     Past Medical History:  Diagnosis Date  . Arthritis    "hands; maybe my toes" (12/20/2017)  . Cardiac arrest (HCC) 11/29/2017   hx VF arrest/notes 12/20/2017  . Coronary artery disease   . GERD (gastroesophageal reflux disease)   . H/O ETOH abuse    /notes 12/20/2017  . High cholesterol   . Hypertension   . Seizures (HCC) ~  2016; ~ 2017   "I've had a couple; I was at work" (12/20/2017)  . Syncope and collapse    "last few days" (12/20/2017)    Current Outpatient Medications  Medication Sig Dispense Refill  . ASPIRIN ADULT LOW STRENGTH 81 MG EC tablet Take 1 tablet (81 mg total) by mouth daily. 90 tablet 1  . atorvastatin (LIPITOR) 80 MG tablet Take 1 tablet (80 mg total) by mouth daily at 6 PM. 90 tablet 1  . carvedilol (COREG) 3.125 MG tablet Take 1 tablet (3.125 mg total) by mouth 2 (two) times daily with a meal. 180 tablet 1  . furosemide (LASIX) 40 MG tablet Take 1 tablet (40 mg total) by mouth daily. May take additional 40 mg as needed. 60 tablet 3  . losartan (COZAAR) 25 MG tablet Take 1 tablet (25 mg total) by mouth daily. 90 tablet 1  . potassium chloride SA (K-DUR) 20 MEQ tablet  Take 1 tablet (20 mEq total) by mouth daily. Take 1 tab when you take a dose of lasix as needed 30 tablet 6   No current facility-administered medications for this encounter.     Vitals:   05/20/19 0852  BP: (!) 142/94  Pulse: 99  SpO2: 95%  Weight: 78 kg (172 lb)   Wt Readings from Last 3 Encounters:  05/20/19 78 kg (172 lb)  05/14/19 77.6 kg (171 lb)  04/24/19 78.5 kg (173 lb)   Vest Clip 40 %   PHYSICAL EXAM: General:  Well appearing. No resp difficulty HEENT: normal Neck: supple. JVP 11-12 . Carotids 2+ bilaterally; no bruits. No lymphadenopathy or thryomegaly appreciated. Cor: PMI normal. Regular rate & rhythm. No rubs, gallops or murmurs. Lungs: clear Abdomen: soft, nontender, distended. No hepatosplenomegaly. No bruits or masses. Good bowel sounds. Extremities: no cyanosis, clubbing, rash, edema Neuro: alert & orientedx3, cranial nerves grossly intact. Moves all 4 extremities w/o difficulty. Affect pleasant.     ASSESSMENT & PLAN: 1. Chronic systolic CHF with ICM - TTE 11/30/17 25-30% RV normal.  - Echo 03/2018: EF40-45%RV normal. - Plan to repeat ECHO in 2 months.  - NYHA IIIb. Volume status elevated. Reds Clip 40%. Discussed limiting fluid intake to < 2 liters per day.  - Increase lasix to 80 mg daily.  -Continuelosartan 25 mg daily. Has previously failed Entresto with orthostasis, syncope, and AKI - Hold off on spiro with noncompliance.  -Continuecoreg 3.125 mg BID - Not eligible for paramedicine [place].  2. CAD s/p CABG 12/07/2017 -Has chronic chest pain since CABG, none exertional. - No chest pain. -ContinueASAandatorvastatin.  3. VF arrest in setting of STEMI -Stable.  4. ETOH abuse - Discussed cessation.    5. Noncompliance Doing ok with medications.  Follow up in 2 weeks to reassess volume status and check BMET.

## 2019-05-20 NOTE — Telephone Encounter (Signed)
CSW spoke with pt to check in following clinic appt today.  Pt reports that appt went well and he is hopeful that extra fluid pills will help to make him feel better as he has continued to be short of breath.  Pt reports no needs at this time  CSW will continue to follow and assist as needed  Burna Sis, LCSW Clinical Social Worker [facility] Desk#: [phone] Cell#: [phone]

## 2019-05-20 NOTE — Patient Instructions (Signed)
Labs were done today. We will call you with any ABNORMAL results. No news is good news!  Your physician has requested that you have an echocardiogram. Echocardiography is a painless test that uses sound waves to create images of your heart. It provides your doctor with information about the size and shape of your heart and how well your heart's chambers and valves are working. This procedure takes approximately one hour. There are no restrictions for this procedure.  This office will call to schedule an appointment with you.  INCREASE Lasix to 80 mg (1 tab) daily, this prescription has been sent to your pharmacy.   Your physician recommends that you schedule a follow-up appointment in: 2 weeks.  PLEASE CUT BACK ON YOUR BEER CONSUMPTION!   Call the office at [phone], option 2 for the nurses, with any questions or concerns. HAPPY Tuesday :)

## 2019-05-22 ENCOUNTER — Telehealth (HOSPITAL_COMMUNITY): Payer: Self-pay | Admitting: Licensed Clinical Social Worker

## 2019-05-22 NOTE — Telephone Encounter (Signed)
Patient was seen in the clinic this week and needing follow up for echo and another physician appt.  CSW involved to help make sure patient has transportation to appts as [facility] is still not providing transport to [place].  CSW had clinic schedule echo and follow up appt on same day, 6/15, so that we only have to get transport once.  CSW set up transport and updated patient on plan- patient in agreement and can make appts on 6/15.  CSW will continue to follow and assist as needed  Burna Sis, LCSW Clinical Social Worker [facility] Desk#: [phone] Cell#: [phone]

## 2019-05-27 ENCOUNTER — Other Ambulatory Visit (HOSPITAL_COMMUNITY): Payer: Medicaid Other

## 2019-06-06 ENCOUNTER — Telehealth (HOSPITAL_COMMUNITY): Payer: Self-pay | Admitting: Licensed Clinical Social Worker

## 2019-06-06 ENCOUNTER — Encounter (HOSPITAL_COMMUNITY): Payer: Medicaid Other

## 2019-06-06 NOTE — Telephone Encounter (Signed)
CSW spoke with [facility] to confirm that pt is still set up for transportation services on Monday to get to his appts- they confirmed pt is on their list for Monday.  CSW spoke with pt to confirm he is still able to make it to Monday appts and remind him that [facility] would be picking him up- pt expressed understanding.  When pt is ready for pick up on Monday clinic staff to call transport coordinator at [phone]  CSW will continue to follow and assist as needed  [name], [facility] Desk#: [phone] Cell#: [phone]

## 2019-06-09 ENCOUNTER — Ambulatory Visit (HOSPITAL_COMMUNITY)
Admission: RE | Admit: 2019-06-09 | Discharge: 2019-06-09 | Disposition: A | Discharge status: Home / Self Care | Payer: Medicaid Other | Source: Ambulatory Visit | Attending: Internal Medicine | Admitting: Internal Medicine

## 2019-06-09 ENCOUNTER — Encounter (HOSPITAL_COMMUNITY): Payer: Self-pay

## 2019-06-09 ENCOUNTER — Other Ambulatory Visit: Payer: Self-pay

## 2019-06-09 VITALS — BP 110/60 | HR 92 | Wt 165.8 lb

## 2019-06-09 DIAGNOSIS — E7800 Pure hypercholesterolemia, unspecified: Secondary | ICD-10-CM | POA: Insufficient documentation

## 2019-06-09 DIAGNOSIS — Z951 Presence of aortocoronary bypass graft: Secondary | ICD-10-CM | POA: Diagnosis not present

## 2019-06-09 DIAGNOSIS — I5022 Chronic systolic (congestive) heart failure: Secondary | ICD-10-CM | POA: Diagnosis not present

## 2019-06-09 DIAGNOSIS — F1010 Alcohol abuse, uncomplicated: Secondary | ICD-10-CM | POA: Diagnosis not present

## 2019-06-09 DIAGNOSIS — Z7982 Long term (current) use of aspirin: Secondary | ICD-10-CM | POA: Diagnosis not present

## 2019-06-09 DIAGNOSIS — Z79899 Other long term (current) drug therapy: Secondary | ICD-10-CM | POA: Diagnosis not present

## 2019-06-09 DIAGNOSIS — I2510 Atherosclerotic heart disease of native coronary artery without angina pectoris: Secondary | ICD-10-CM

## 2019-06-09 DIAGNOSIS — K219 Gastro-esophageal reflux disease without esophagitis: Secondary | ICD-10-CM | POA: Diagnosis not present

## 2019-06-09 DIAGNOSIS — Z91199 Patient's noncompliance with other medical treatment and regimen due to unspecified reason: Secondary | ICD-10-CM

## 2019-06-09 DIAGNOSIS — I110 Hypertensive heart disease with heart failure: Secondary | ICD-10-CM | POA: Diagnosis not present

## 2019-06-09 DIAGNOSIS — I255 Ischemic cardiomyopathy: Secondary | ICD-10-CM | POA: Insufficient documentation

## 2019-06-09 DIAGNOSIS — M1990 Unspecified osteoarthritis, unspecified site: Secondary | ICD-10-CM | POA: Diagnosis not present

## 2019-06-09 DIAGNOSIS — Z8674 Personal history of sudden cardiac arrest: Secondary | ICD-10-CM | POA: Insufficient documentation

## 2019-06-09 DIAGNOSIS — Z9119 Patient's noncompliance with other medical treatment and regimen: Secondary | ICD-10-CM

## 2019-06-09 MED ORDER — CARVEDILOL 6.25 MG PO TABS: 6.2500 mg | ORAL_TABLET | Freq: Two times a day (BID) | ORAL | 3 refills | Status: DC

## 2019-06-09 NOTE — Progress Notes (Signed)
  Echocardiogram 2D Echocardiogram has been performed.  [name] [name] 06/09/2019, 3:57 PM

## 2019-06-09 NOTE — Progress Notes (Signed)
PCP: None  Primary Cardiologist: Dr [name]   [name] a 58 y.o.malewith Jose [name], Jose [name], Jose [name], Jose [name] with EF 25-30%.  Admitted 12/6 - 12/14/17 with V-Fib [name] while chopping wood. Received CPR in the field. R/LHC with severe 3vD as below Jose [name] requiring IABP. Required milrinone with continued [name]. Pt improved Jose [name] 12/10, but pt had recurrent Jose [name] so IABP replaced. Stabilized Jose [name]. Tolerated gradual wean of meds Jose [name]. HF medications titrated as tolerated.  Admitted 12/27 ->12/28 with Jose Jose [name] thought to be 2/2 volume depletion on lasix, spiro, Jose [name]. CT negative for ICH. Improved rapidly with holding meds Jose [name]. Jose [name]. Spiro cut back Jose switched to low dose losartan.  Today he returns for HF follow up. Last visit lasix was increased to 80 mg daily. Complaining of fatigue. SOB with exertion. Denies orthopnea. Appetite ok. No fever or chills. Weight at home 170  pounds. Drinking 8-9 beers per week. Taking all medications. Lives with his 2 brothers. Having a hard time paying his electric bill. Has a hard time paying for medications.    ROS: All systems negative except as listed in HPI, PMH Jose [name].  SH:  Social History   Socioeconomic History  . Marital status: Divorced    Jose [name]  . Number of children: Not on [name]  . Years of education: Not on [name]  . Highest education level: Not on [name]  Occupational History  . Not on [name]  Social Needs  . Financial resource strain: Not on [name]  . Food insecurity    Worry: Not on [name]    Inability: Not on [name]  . Transportation needs    Medical: Not on [name]    Non-medical: Not on [name]  Tobacco Use  . Smoking status: Never Smoker  . Smokeless tobacco: Never Used  Substance Jose [name]  . Alcohol use: Yes    Comment: 12/20/2017 "I quit  drinking after I had the heart attack"  . Drug use: No  . Sexual [name]: Not Currently  Lifestyle  . Physical [name]    Days per week: Not on [name]    Minutes per session: Not on [name]  . Stress: Not on [name]  Relationships  . Social Musicianconnections    Talks on phone: Not on [name]    Gets together: Not on [name]    Attends religious service: Not on [name]    Active member of club or organization: Not on [name]    Attends meetings of clubs or organizations: Not on [name]    Relationship status: Not on [name]  . Intimate partner violence    Fear of current or ex partner: Not on [name]    Emotionally abused: Not on [name]    Physically abused: Not on [name]    Forced sexual [name]: Not on [name]  Other Topics Concern  . Not on [name]  Social History Narrative  . Not on [name]    FH:  Family History  Problem Relation Age of Onset  . Heart attack Other     Past Medical History:  Diagnosis Date  . Arthritis    "hands; maybe my toes" (12/20/2017)  . Cardiac [name] (Jose) 11/29/2017   hx Jose [name] 12/20/2017  . Coronary artery disease   . GERD (gastroesophageal reflux disease)   . Jose [name]    /notes 12/20/2017  .  High cholesterol   . Hypertension   . Seizures (Jose) ~ 2016; ~ 2017   "I've had a couple; I was at work" (12/20/2017)  . Jose [name]    "last few days" (12/20/2017)    Current Outpatient Medications  Medication Sig Dispense Refill  . ASPIRIN ADULT LOW STRENGTH 81 MG EC tablet Take 1 tablet (81 mg total) by mouth daily. 90 tablet 1  . atorvastatin (LIPITOR) 80 MG tablet Take 1 tablet (80 mg total) by mouth daily at 6 PM. 90 tablet 1  . carvedilol (COREG) 3.125 MG tablet Take 1 tablet (3.125 mg total) by mouth 2 (two) times daily with a meal. 180 tablet 1  . furosemide (LASIX) 80 MG tablet Take 1 tablet (80 mg total) by mouth daily. May take additional 40 mg as needed. 90 tablet 1  . losartan (COZAAR) 25 MG tablet Take 1 tablet (25 mg total) by mouth daily. 90  tablet 1  . potassium chloride SA (K-DUR) 20 MEQ tablet Take 1 tablet (20 mEq total) by mouth daily. Take 1 tab when you take a dose of lasix as needed 30 tablet 6   No current facility-administered medications for this encounter.     Vitals:   06/09/19 1540  BP: 110/60  Pulse: 92  SpO2: 98%  Weight: 75.2 kg (165 lb 12.8 oz)   Wt Readings from Last 3 Encounters:  06/09/19 75.2 kg (165 lb 12.8 oz)  05/20/19 78 kg (172 lb)  05/14/19 77.6 kg (171 lb)   ReDS Vest - 06/09/19 1542      ReDS Vest   Estimated volume prior to reading  Med    Fitting Posture  Sitting    Height Marker  Tall    Ruler Value  26    Center Strip  Aligned    ReDS Value  36    Anatomical Comments  station C         PHYSICAL EXAM: General:  No resp difficulty HEENT: normal Neck: supple. JVP 6-7 . Carotids 2+ bilat; no bruits. No lymphadenopathy or thryomegaly appreciated. Cor: PMI nondisplaced. Regular rate & rhythm. No rubs, gallops or murmurs. Lungs: clear Abdomen: soft, nontender, nondistended. No hepatosplenomegaly. No bruits or masses. Good bowel sounds. Extremities: no cyanosis, clubbing, rash, edema Neuro: alert & orientedx3, cranial nerves grossly intact. moves all 4 extremities w/o difficulty. Affect pleasant  EKG: SR with occasional PVCs 85 bpm.    ASSESSMENT & PLAN: 1. Chronic systolic CHF with [name] - TTE 11/30/17 25-30% RV normal. Echo 03/2018: EF40-45%RV normal.  Todays ECHO EF ~ 25%. ECHO reviewed by Dr [name]. I provided him with the results.  -NYHA IIIb. Volume status stable. Continue lasix 80 mg daily.   -Continuelosartan 25 mg daily. Has previously failed Jose with orthostasis, Jose, Jose [name] - Hold off on spiro with noncompliance.  -Increase coreg to 6.25 mg twice a day.  - Not eligible for [place]. - May need to do CPX to help him qualify for disability.  - Check BMEt today.   2. Jose [name] -No  chest pain.  -ContinueASAandatorvastatin.  3. Jose [name] -Stable.  4. [name] [name] We discussed that he needs to avoid alcohol all together.   5. Noncompliance Doing ok with medications.  Follow up 3 weeks. Will need to try Jose find a way to get medications mailed to his house. He has Medicaid so I will follow up with pharmacy  to see if we can switch pharmacies. Unfortunately he cant afford the co-pay.     NP-C  4:42 PM

## 2019-06-09 NOTE — Patient Instructions (Signed)
Lab work done today. We will notify you of any abnormal lab work. No news is good news!  EKG done today.  INCREASE Carvedilol to 6.25mg  two times daily.  Please follow up with the [facility] in 3 weeks.  At the [facility], you and your health needs are our priority. As part of our continuing mission to provide you with exceptional heart care, we have created designated Provider Care Teams. These Care Teams include your primary Cardiologist (physician) and Advanced Practice Providers (APPs- Physician Assistants and Nurse Practitioners) who all work together to provide you with the care you need, when you need it.   You may see any of the following providers on your designated Care Team at your next follow up: Marland Kitchen Dr Glori Bickers . Dr Loralie Champagne . Darrick Grinder, NP

## 2019-06-09 NOTE — Progress Notes (Signed)
ReDS Vest - 06/09/19 1542      ReDS Vest   Estimated volume prior to reading  Med    Fitting Posture  Sitting    Height Marker  Tall    Ruler Value  26    Center Strip  Aligned    ReDS Value  36    Anatomical Comments  station C

## 2019-06-10 ENCOUNTER — Telehealth (HOSPITAL_COMMUNITY): Payer: Self-pay | Admitting: Licensed Clinical Social Worker

## 2019-06-10 NOTE — Telephone Encounter (Signed)
Pt reached out to social work to discuss current housing concerns.  Patient received a letter from his landlord stating that he and his brother were past due on rent and owed a total of $1,400.  The letter stated that if $400 of this was not paid in the next 10 days then the landlord would take next steps to evict him.  Per the patient his brother was the sole income earner int he family but he has been unable to work due to the COVID-19 pandemic (did Architect work outside and was informed there wasn't enough work for him at this time) and due to health concerns.  The brother is currently trying to get VA benefits but no word on the progress of this yet.  CSW inquired about pt status in getting government assistance.  Patient has Medicaid but his SSDI case is still pending so he has no source of income.  CSW called [employer] to see what the status is of the patients disability claim- they report they are awaiting documentation from [facility] to finalize determination- CSW left message with pt case worker Junious Dresser to discuss and see what documentation is needed so we can assist in following up.  CSW also left messages with [place] and [employer] to discuss possible rental assistance.  Message also left for pt land lord, Owens Shark- [phone], to discuss extension on getting the money so we can explore assistance options.  CSW will continue to follow and assist as needed  [name], [facility] Desk#: [phone] Cell#: [phone]

## 2019-06-11 ENCOUNTER — Telehealth (HOSPITAL_COMMUNITY): Payer: Self-pay | Admitting: Licensed Clinical Social Worker

## 2019-06-11 NOTE — Telephone Encounter (Addendum)
CSW able to speak with supervisor at [facility] and confirm what notes they need from heart failure clinic- CSW faxed over all [facility] notes since April 2019 and echo performed this week as requested.  [employer] will review documents and see if they can proceed but are still awaiting further documentation from [place] able to speak with DSS regarding possible emergency assistance but per representative pt does not qualify as he does not have a minor child living with him who is under his care.  They provided CSW with contact information for [employer] who they say might be able to provide assistance.  CSW called [employer] and they said they are currently providing rental assistance but would need proof of crisis.  CSW had pt send copy of letter from his landlord and sent to clinic to fax in for review.  CSW will continue to follow and assist as needed  [name], [facility] Desk#: [phone] Cell#: [phone]

## 2019-06-13 ENCOUNTER — Telehealth (HOSPITAL_COMMUNITY): Payer: Self-pay | Admitting: Licensed Clinical Social Worker

## 2019-06-13 NOTE — Telephone Encounter (Signed)
CSW received call from pt land [facility] [phone].  CSW explained that we were involved and trying to find patient assistance and asked if we could be flexible on the date for which the money is due.  Land lord is agreeable to working with [place] and trying to give [place] more time to help find assistance- he is also agreeable to filling out the paperwork necessary for the [employer] assistance.  CSW messaged [employer] [place] with landlords address to send necessary paperwork.  CSW updated patient and encouraged him to keep exploring other options for getting the $400 in case this assistance program doesn't work out.  CSW will continue to follow and assist as needed  [name], [facility] Desk#: [phone] Cell#: [phone]

## 2019-06-16 ENCOUNTER — Telehealth (HOSPITAL_COMMUNITY): Payer: Self-pay | Admitting: Licensed Clinical Social Worker

## 2019-06-16 NOTE — Telephone Encounter (Signed)
Patient called CSW to say he received paperwork from [facility] regarding his disability application. He has a scheduled appointment on July 27th for medical review to determine eligibility for disability. Patient requesting assistance with transportation to appointment. CSW confirmed with patient that transport can be arranged. Patient grateful and will return call to confirm appointment time. Raquel Sarna, [place], [place]

## 2019-06-17 ENCOUNTER — Telehealth (HOSPITAL_COMMUNITY): Payer: Self-pay | Admitting: Licensed Clinical Social Worker

## 2019-06-17 NOTE — Telephone Encounter (Signed)
CSW spoke with pt regarding current housing concerns.  CSW left another message for [employer] to send patient landlord a form to fill out to request assistance- awaiting return call.  Pt also reports that his brother is going back to work today so hopefully he will be able to pay the landlord small amounts of the rent to hold off any eviction proceedings.  Pt also reports that he has an appt for his disability determination on 7/27 but he is confused about if he needs to go.  CSW called pt [employer] Case worker who reports that she is working on his case currently and that it is looking like he might not need to go since I sent in so much paperwork that they were waiting on.  She will inform pt ASAP regarding if he needs to get transportation for that appt.  CSW will continue to follow and assist as needed  [name], [facility] Desk#: [phone] Cell#: [phone]

## 2019-06-20 ENCOUNTER — Telehealth (HOSPITAL_COMMUNITY): Payer: Self-pay | Admitting: Licensed Clinical Social Worker

## 2019-06-20 NOTE — Telephone Encounter (Signed)
Pt called CSW to inform that he had received paperwork from [employer] to apply for rental assistance.  Pt confused on how to answer all of the questions so CSW assisted in reviewing with patient.  Patient will plan to give to landlord to fill out his portion today and then return to [employer] [place].  Outstanding question is that they require proof of income but patient brother is the only one employed and works under the table- [place] called [employer] to inquire what they would require as proof- left message  CSW will continue to follow and assist as needed  [name], LCSW Clinical Social Worker [facility] Desk#: [phone] Cell#: [phone]

## 2019-06-24 ENCOUNTER — Telehealth (HOSPITAL_COMMUNITY): Payer: Self-pay | Admitting: Adult Health

## 2019-06-24 ENCOUNTER — Telehealth (HOSPITAL_COMMUNITY): Payer: Self-pay | Admitting: Licensed Clinical Social Worker

## 2019-06-24 MED FILL — POTASSIUM CHLORIDE CRYS ER: 20 | 30 days supply | Qty: 30 | Fill #0

## 2019-06-24 MED FILL — FUROSEMIDE 40 MG TAB: 40 | 30 days supply | Qty: 60 | Fill #0

## 2019-06-24 MED FILL — CARVEDILOL 6.25 MG TABLET: 6.25 | 90 days supply | Qty: 180 | Fill #0

## 2019-06-24 NOTE — Telephone Encounter (Signed)
Pt called CSW to inform that he is running out of his medications and needs more.  CSW able to call [employer] and have them mail out medications to patient- pt updated  Pt also confirmed that he completed form from [place] and sent it back to [employer] to request assistance with rent.  CSW will continue to follow and assist as needed  [name], [facility] Desk#: [phone] Cell#: [phone]

## 2019-06-24 NOTE — Telephone Encounter (Signed)
Error

## 2019-06-28 MED FILL — LOSARTAN POTASSIUM 25 MG TA: 25 | 90 days supply | Qty: 90 | Fill #1

## 2019-06-28 MED FILL — ATORVASTATIN 80 MG TABLET: 80 | 30 days supply | Qty: 30 | Fill #0

## 2019-07-09 ENCOUNTER — Telehealth (HOSPITAL_COMMUNITY): Payer: Self-pay | Admitting: Licensed Clinical Social Worker

## 2019-07-09 NOTE — Telephone Encounter (Signed)
Pt called CSW this morning to inform that his power is out.  Patients electrical box was destroyed but states his landlord won't fix it because they are behind $1,600 on rent and he is beginning eviction proceedings.  States that patient would be given a 10 day notice.  CSW called pt landlord who confirms this and states he has submitted paperwork with the court and the patient should be getting served in the next several days as the court date is next Tuesday.  The landlord is stating that he would be unwilling to allow the patient to stay for partial or full payment due to past conflicts.  CSW attempted to discuss further but landlord is unwilling to budge at this time.  CSW will help patient look into alternative living situations but currently brother is working very part time only making a couple hundred dollars a month and the patient is awaiting disability determination.  CSW called pt disability worker Pamala Hurry who confirms that they have everything they need to make a determination and that they should have one in about 2 weeks.  CSW set up transportation for patient for his appointment next week on 7/22 through [employer].  CSW will continue to follow and assist as needed  [name], [facility] Desk#: [phone] Cell#: [phone]

## 2019-07-15 ENCOUNTER — Telehealth (HOSPITAL_COMMUNITY): Payer: Self-pay | Admitting: Licensed Clinical Social Worker

## 2019-07-15 NOTE — Telephone Encounter (Signed)
CSW called pt to check in and remind patient of appt tomorrow- pt confirms he knows about appt and has no barriers to attending.  Pt confirms he has not been in contact with anyone with COVID-19 to his knowledge.  Pt also confirms that he had his court date today but his landlord did not complete the paperwork properly so they were unable to proceed with eviction notice.  Pt states he should be hearing regarding his disability before the month ends so is hopeful he will hear about disability before he is evicted  CSW will continue to follow and assist as needed  [name], [facility] Desk#: [phone] Cell#: [phone]

## 2019-07-16 ENCOUNTER — Encounter (HOSPITAL_COMMUNITY): Payer: Self-pay | Admitting: Internal Medicine

## 2019-07-16 ENCOUNTER — Other Ambulatory Visit: Payer: Self-pay

## 2019-07-16 ENCOUNTER — Ambulatory Visit (HOSPITAL_COMMUNITY)
Admission: RE | Admit: 2019-07-16 | Discharge: 2019-07-16 | Disposition: A | Discharge status: Home / Self Care | Payer: Medicaid Other | Source: Ambulatory Visit | Attending: Internal Medicine | Admitting: Internal Medicine

## 2019-07-16 VITALS — BP 132/78 | HR 74 | Wt 166.5 lb

## 2019-07-16 DIAGNOSIS — R0602 Shortness of breath: Secondary | ICD-10-CM | POA: Diagnosis not present

## 2019-07-16 DIAGNOSIS — Z9119 Patient's noncompliance with other medical treatment and regimen: Secondary | ICD-10-CM | POA: Insufficient documentation

## 2019-07-16 DIAGNOSIS — M1990 Unspecified osteoarthritis, unspecified site: Secondary | ICD-10-CM | POA: Diagnosis not present

## 2019-07-16 DIAGNOSIS — Z9114 Patient's other noncompliance with medication regimen: Secondary | ICD-10-CM | POA: Insufficient documentation

## 2019-07-16 DIAGNOSIS — Z8249 Family history of ischemic heart disease and other diseases of the circulatory system: Secondary | ICD-10-CM | POA: Diagnosis not present

## 2019-07-16 DIAGNOSIS — E7800 Pure hypercholesterolemia, unspecified: Secondary | ICD-10-CM | POA: Insufficient documentation

## 2019-07-16 DIAGNOSIS — Z8674 Personal history of sudden cardiac arrest: Secondary | ICD-10-CM | POA: Insufficient documentation

## 2019-07-16 DIAGNOSIS — I5022 Chronic systolic (congestive) heart failure: Secondary | ICD-10-CM | POA: Diagnosis not present

## 2019-07-16 DIAGNOSIS — Z79899 Other long term (current) drug therapy: Secondary | ICD-10-CM | POA: Insufficient documentation

## 2019-07-16 DIAGNOSIS — Z951 Presence of aortocoronary bypass graft: Secondary | ICD-10-CM

## 2019-07-16 DIAGNOSIS — I110 Hypertensive heart disease with heart failure: Secondary | ICD-10-CM | POA: Diagnosis not present

## 2019-07-16 DIAGNOSIS — I2510 Atherosclerotic heart disease of native coronary artery without angina pectoris: Secondary | ICD-10-CM | POA: Diagnosis not present

## 2019-07-16 LAB — BASIC METABOLIC PANEL
Anion gap: 10 (ref 5–15)
BUN: 12 mg/dL (ref 6–20)
CO2: 24 mmol/L (ref 22–32)
Calcium: 9.3 mg/dL (ref 8.9–10.3)
Chloride: 105 mmol/L (ref 98–111)
Creatinine, Ser: 0.96 mg/dL (ref 0.61–1.24)
GFR calc Af Amer: >60 mL/min (ref >60)
GFR calc non Af Amer: >60 mL/min (ref >60)
Glucose, Bld: 102 mg/dL — ABNORMAL HIGH (ref 70–99)
Potassium: 3.7 mmol/L (ref 3.5–5.1)
Sodium: 139 mmol/L (ref 135–145)

## 2019-07-16 LAB — BRAIN NATRIURETIC PEPTIDE: B Natriuretic Peptide: 125.2 pg/mL — ABNORMAL HIGH (ref 0.0–100.0)

## 2019-07-16 MED ORDER — ENTRESTO 49-51 MG PO TABS: 1.0000 | ORAL_TABLET | Freq: Two times a day (BID) | ORAL | 3 refills | Status: DC

## 2019-07-16 MED ORDER — LOSARTAN POTASSIUM 50 MG PO TABS: 50.0000 mg | ORAL_TABLET | Freq: Every day | ORAL | 6 refills | Status: DC

## 2019-07-16 NOTE — Addendum Note (Signed)
Encounter addended by: Scarlette Calico, RN on: 07/16/2019 12:41 PM  Actions taken: Medication long-term status modified, Pharmacy for encounter modified, Order list changed, Clinical Note Signed

## 2019-07-16 NOTE — Patient Instructions (Addendum)
Start Entresto 49/51 mg Twice daily   Labs done today  You have been referred to EP doctors to discuss getting a defibrillator  Please follow up with our heart failure pharmacist in 3 weeks  Your physician recommends that you schedule a follow-up appointment in: 3 months  At the [facility], you and your health needs are our priority. As part of our continuing mission to provide you with exceptional heart care, we have created designated Provider Care Teams. These Care Teams include your primary Cardiologist (physician) and Advanced Practice Providers (APPs- Physician Assistants and Nurse Practitioners) who all work together to provide you with the care you need, when you need it.   You may see any of the following providers on your designated Care Team at your next follow up: Marland Kitchen Dr Glori Bickers . Dr Loralie Champagne . Darrick Grinder, NP

## 2019-07-16 NOTE — Addendum Note (Signed)
Encounter addended by: Scarlette Calico, RN on: 07/16/2019 12:33 PM  Actions taken: Diagnosis association updated, Flowsheet accepted, Clinical Note Signed, Pharmacy for encounter modified, Order list changed

## 2019-07-16 NOTE — Addendum Note (Signed)
Encounter addended by: Jolaine Artist, MD on: 07/16/2019 12:42 PM  Actions taken: Clinical Note Signed

## 2019-07-16 NOTE — Progress Notes (Addendum)
PCP: None  Primary Cardiologist: Dr Haroldine Laws   HPI: [name] [facility] a 58 y.o.malewith h/o ETOH, VF Arrest, CAD s/p CABG 12/07/17, and ICM with EF 25-30%.  Admitted 12/6 - 12/14/17 with V-Fib arrest while chopping wood. Received CPR in the field. R/LHC with severe 3vD as below and cardiogenic shock requiring IABP. Required milrinone with continued shock. Pt improved and IABP removed 12/10, but pt had recurrent VF arrest so IABP replaced. Stabilized and underwent CABG 12/07/17. Tolerated gradual wean of meds and extubation with no furthe events. HF medications titrated as tolerated.  Admitted 12/27 ->12/28 with syncope and AKI thought to be 2/2 volume depletion on lasix, spiro, and Entresto. CT negative for ICH. Improved rapidly with holding meds and gentle IVF. Entresto stopped. Spiro cut back and switched to low dose losartan.  Today he returns for HF follow up. Says he has been doing ok but about to get evicted from his trailer because he can't pay the bills. Says chest is always tight and he gets SOB with tying his shoes but says he can walk a mile and back if he needs and also can ride his Van bike to the store. No edema, orthopnea or PND. Taking meds all the time. Drinking a few beers a few days per week.  Echo 5/20 EF 25%  ROS: All systems negative except as listed in HPI, PMH and Problem List.  SH:  Social History   Socioeconomic History  . Marital status: Divorced    Spouse name: Not on file  . Number of children: Not on file  . Years of education: Not on file  . Highest education level: Not on file  Occupational History  . Not on file  Social Needs  . Financial resource strain: Not on file  . Food insecurity    Worry: Not on file    Inability: Not on file  . Transportation needs    Medical: Not on file    Non-medical: Not on file  Tobacco Use  . Smoking status: Never Smoker  . Smokeless tobacco: Never Used  Substance and Sexual Activity  . Alcohol use:  Yes    Comment: 12/20/2017 "I quit drinking after I had the heart attack"  . Drug use: No  . Sexual activity: Not Currently  Lifestyle  . Physical activity    Days per week: Not on file    Minutes per session: Not on file  . Stress: Not on file  Relationships  . Social Herbalist on phone: Not on file    Gets together: Not on file    Attends religious service: Not on file    Active member of club or organization: Not on file    Attends meetings of clubs or organizations: Not on file    Relationship status: Not on file  . Intimate partner violence    Fear of current or ex partner: Not on file    Emotionally abused: Not on file    Physically abused: Not on file    Forced sexual activity: Not on file  Other Topics Concern  . Not on file  Social History Narrative  . Not on file    FH:  Family History  Problem Relation Age of Onset  . Heart attack Other     Past Medical History:  Diagnosis Date  . Arthritis    "hands; maybe my toes" (12/20/2017)  . Cardiac arrest ([place]) 11/29/2017   hx VF arrest/notes 12/20/2017  . Coronary  artery disease   . GERD (gastroesophageal reflux disease)   . H/O ETOH abuse    /notes 12/20/2017  . High cholesterol   . Hypertension   . Seizures ([facility]) ~ 2016; ~ 2017   "I've had a couple; I was at work" (12/20/2017)  . Syncope and collapse    "last few days" (12/20/2017)    Current Outpatient Medications  Medication Sig Dispense Refill  . ASPIRIN ADULT LOW STRENGTH 81 MG EC tablet Take 1 tablet (81 mg total) by mouth daily. 90 tablet 1  . atorvastatin (LIPITOR) 80 MG tablet Take 1 tablet (80 mg total) by mouth daily at 6 PM. 90 tablet 1  . carvedilol (COREG) 6.25 MG tablet Take 1 tablet (6.25 mg total) by mouth 2 (two) times daily with a meal. 180 tablet 3  . furosemide (LASIX) 80 MG tablet Take 1 tablet (80 mg total) by mouth daily. May take additional 40 mg as needed. 90 tablet 1  . losartan (COZAAR) 25 MG tablet Take 1 tablet (25  mg total) by mouth daily. 90 tablet 1  . potassium chloride SA (K-DUR) 20 MEQ tablet Take 1 tablet (20 mEq total) by mouth daily. Take 1 tab when you take a dose of lasix as needed 30 tablet 6   No current facility-administered medications for this encounter.     Vitals:   07/16/19 1143 07/16/19 1146  BP:  132/78  Pulse: 74   SpO2: 98%   Weight: 75.5 kg (166 lb 8 oz)    Wt Readings from Last 3 Encounters:  07/16/19 75.5 kg (166 lb 8 oz)  06/09/19 75.2 kg (165 lb 12.8 oz)  05/20/19 78 kg (172 lb)       PHYSICAL EXAM: General:  Well appearing. No resp difficulty HEENT: normal Neck: supple. JVP hard to see but looks mildly elevated Carotids 2+ bilat; no bruits. No lymphadenopathy or thryomegaly appreciated. Cor: PMI nondisplaced. Regular rate & rhythm. No rubs, gallops or murmurs. Lungs: clear Abdomen: soft, nontender, + distended. No hepatosplenomegaly. No bruits or masses. Good bowel sounds. Extremities: no cyanosis, clubbing, rash, edema Neuro: alert & orientedx3, cranial nerves grossly intact. moves all 4 extremities w/o difficulty. Affect pleasant    ASSESSMENT & PLAN: 1. Chronic systolic CHF with ICM - TTE 11/30/17 25-30% RV normal. Echo 03/2018: EF40-45%RV normal.  - ECHO 5/20 EF ~ 25%.  - Hard to assess functional status and his history is variable. Likely NYHA II-III  - Volume status elevated. ReDS 43%   - Will switch losartan to Entresto 49/51 and see if he can tolerate (previously had struggled with hypotension but now seems improved) - Hold off on spiro with noncompliance. Possibly can consider soon -Continue coreg to 6.25 mg bid - [place] - Pending disability eval - May need to do CPX  - Refer to EP for ICD - Refer to PharmD med titration clinic  2. CAD s/p CABG 12/07/2017 - Chest tight constantly but not worse with exertion. Doubt angina -ContinueASAandatorvastatin.  3. VF arrest  in setting of STEMI -Stable.  4. ETOH abuse - We discussed that he needs to avoid alcohol all together.   5. Noncompliance - Doing ok with medications.   Arvilla Meresaniel [name]  MD 12:07 PM

## 2019-07-16 NOTE — Progress Notes (Signed)
ReDS Vest - 07/16/19 1200      ReDS Vest   Fitting Posture  Sitting    Height Marker  --   station C   Ruler Value  28    [place]  Aligned    ReDS Value  43

## 2019-07-18 ENCOUNTER — Other Ambulatory Visit (HOSPITAL_COMMUNITY): Payer: Self-pay

## 2019-07-18 ENCOUNTER — Telehealth (HOSPITAL_COMMUNITY): Payer: Self-pay | Admitting: Licensed Clinical Social Worker

## 2019-07-18 ENCOUNTER — Telehealth (HOSPITAL_COMMUNITY): Payer: Self-pay | Admitting: Pharmacy Technician

## 2019-07-18 MED ORDER — ENTRESTO 49-51 MG PO TABS: 1.0000 | ORAL_TABLET | Freq: Two times a day (BID) | ORAL | 3 refills | Status: DC

## 2019-07-18 MED FILL — POTASSIUM CHLORIDE CRYS ER: 20 | 30 days supply | Qty: 30 | Fill #1

## 2019-07-18 MED FILL — ENTRESTO 49 MG-51 MG TABLET: 49-51 | 30 days supply | Qty: 60 | Fill #0

## 2019-07-18 MED FILL — FUROSEMIDE 40 MG TAB: 40 | 30 days supply | Qty: 60 | Fill #1

## 2019-07-18 MED FILL — LOSARTAN POTASSIUM 25 MG TA: 25 | 30 days supply | Qty: 30 | Fill #1

## 2019-07-18 MED FILL — ASPIRIN LOW DOSE 81 MG TBEC: 81 | 90 days supply | Qty: 90 | Fill #0

## 2019-07-18 MED FILL — ATORVASTATIN 80 MG TABLET: 80 | 30 days supply | Qty: 30 | Fill #0

## 2019-07-18 NOTE — Telephone Encounter (Signed)
Pt called CSW and informed that he is out of his aspirin and can't afford to pick any more up at the store.  CSW called [facility] and had them fill pt aspirin and his other medications that were due to be filled and send them out through mail order.  CSW called pt and informed- no further needs at this time  CSW will continue to follow and assist as needed  [name], [facility] Desk#: [phone] Cell#: [phone]

## 2019-07-18 NOTE — Telephone Encounter (Signed)
Prior Authorization requested for [employer]. Sent Prior to [employer].   Confirmation #:[id number] [place], CPhT

## 2019-07-18 NOTE — Telephone Encounter (Addendum)
Prior Authorization for [name] was approved through [place]. Called to make the patient aware and there is no way to leave a voicemail on the phone number provided.   [facility] and gave them the okay to fill the medication. Looks like he had several others waiting to be shipped from there as well.  PA: [id number] Approval Dates: 07/18/2019-07/17/2020   Charlann Boxer, CPhT

## 2019-07-23 ENCOUNTER — Telehealth (HOSPITAL_COMMUNITY): Payer: Self-pay | Admitting: Licensed Clinical Social Worker

## 2019-07-23 NOTE — Telephone Encounter (Signed)
CSW called pt to check in and see if he got his medications.  Patient states he has not gotten his medication yet.  CSW called [facility] who states they didn't send out medications because pt does not have a credit card on file- CSW explained that pt does not have an income and is on Medicaid so the pharmacy had been waiving his copays.  They confirmed they could do that this time too and will send out the medications.  Pt also informed CSW that he was approved for disability and should start getting an income in a few weeks- unsure how much he will be getting yet.  Pt had court date yesterday which ruled that they could be evicted but pt plans to talk with his landlord about staying since he will now have steady income.  CSW will continue to follow and assist as needed  [name], [facility] Desk#: [phone] Cell#: [phone]

## 2019-07-24 ENCOUNTER — Telehealth (HOSPITAL_COMMUNITY): Payer: Self-pay | Admitting: Pharmacy Technician

## 2019-07-24 NOTE — Telephone Encounter (Signed)
Opened in error.   Charlann Boxer, CPhT

## 2019-07-29 ENCOUNTER — Telehealth (HOSPITAL_COMMUNITY): Payer: Self-pay | Admitting: Licensed Clinical Social Worker

## 2019-07-29 NOTE — Telephone Encounter (Signed)
CSW received call from pt stating that he has an appt this Thursday at [facility] but has no way to get there as [facility] is still not running out of [facility].  CSW set up ride through [employer] for Thursday and informed patient- patient will need to call CSW to schedule pick up once done with appt.  CSW will continue to follow and assist as needed  [name], [facility] Desk#: [phone] Cell#: [phone]

## 2019-07-31 ENCOUNTER — Telehealth (HOSPITAL_COMMUNITY): Payer: Self-pay | Admitting: Licensed Clinical Social Worker

## 2019-07-31 ENCOUNTER — Ambulatory Visit: Payer: Medicaid Other | Admitting: Internal Medicine

## 2019-07-31 NOTE — Telephone Encounter (Signed)
CSW received call from patient stating that transportation never came to pick him up for his 10:30appt at [facility] called transportation company who states that they attempted to pick patient up but couldn't identify which lot was theirs and did not see the patient- states that they attempted to call but the patient phone isn't working (out of minutes)  At this point patient is unable to make it to todays appt so rescheduled for 8/31- CSW will follow and assist with transportation if needed at that time  CSW will continue to follow and assist as needed  [name], [place] Worker [facility] Desk#: [phone] Cell#: [phone]

## 2019-08-01 ENCOUNTER — Telehealth (HOSPITAL_COMMUNITY): Payer: Self-pay | Admitting: Licensed Clinical Social Worker

## 2019-08-01 NOTE — Telephone Encounter (Signed)
CSW set up transport for pt to get to clinic appt on Monday- confirmed with [facility].  CSW informed pt and requested he bring all his medications to appt.  CSW will continue to follow and assist as needed  [name], [facility] Desk#: [phone] Cell#: [phone]

## 2019-08-04 ENCOUNTER — Inpatient Hospital Stay (HOSPITAL_COMMUNITY): Admission: RE | Admit: 2019-08-04 | Payer: Medicaid Other | Source: Ambulatory Visit

## 2019-08-05 ENCOUNTER — Telehealth (HOSPITAL_COMMUNITY): Payer: Self-pay | Admitting: Licensed Clinical Social Worker

## 2019-08-05 NOTE — Telephone Encounter (Signed)
CSW informed that pt did not make it to appt yesterday despite having transport set up through [employer].  CSW attempted to call pt to discuss- went straight to VM but unable to leave message.  CSW also attempted to call pt brother- his number also went straight to VM and unable to leave message  CSW will continue to attempt to contact pt to try and reschedule appt and ensure he is ok.  [name], LCSW Clinical Social Worker [facility] Desk#: [phone] Cell#: [phone]

## 2019-08-06 ENCOUNTER — Telehealth (HOSPITAL_COMMUNITY): Payer: Self-pay | Admitting: Licensed Clinical Social Worker

## 2019-08-06 NOTE — Telephone Encounter (Signed)
CSW attempted to call pt and pt brother again to see if they were ok and discuss rescheduling appt- both phones went straight to message stating that VM wasn't set up.  CSW called number for neighbor listed in chart- neighbor reports they moved out and he has no way to get a hold of them.  CSW will continue to reach out   [name], [facility] Desk#: [phone] Cell#: [phone]

## 2019-08-11 ENCOUNTER — Telehealth (HOSPITAL_COMMUNITY): Payer: Self-pay | Admitting: Licensed Clinical Social Worker

## 2019-08-11 NOTE — Telephone Encounter (Addendum)
CSW received call from pt from a new number.  Patient reports that he and his brother were arrested because they were officially evicted from their residence and hadn't moved out yet.    Patient now living at a home that was found with the help of his brother's employer- is unsure if he wants to stay there but has already paid a deposit and first months rent so has stable housing for now.  Pt also wanting assistance with getting direct deposit set up for his social security.  CSW attempted to call social security office with patient but they closed at 4pm- will attempt again tomorrow.  CSW will continue to follow and assist as needed  [name], [facility] Desk#: [phone] Cell#: [phone]

## 2019-08-12 ENCOUNTER — Telehealth (HOSPITAL_COMMUNITY): Payer: Self-pay | Admitting: Licensed Clinical Social Worker

## 2019-08-12 NOTE — Telephone Encounter (Signed)
CSW called pt this morning and helped him call social security.  Had the social security office change the pt address in the system and input patients new banking information so he can receive direct deposits.  Pt reports no other needs at this time.  CSW will continue to follow and assist as needed  [name], [facility] Desk#: [phone] Cell#: [phone]

## 2019-08-15 ENCOUNTER — Telehealth (HOSPITAL_COMMUNITY): Payer: Self-pay | Admitting: Licensed Clinical Social Worker

## 2019-08-15 NOTE — Telephone Encounter (Signed)
CSW called pt to check in.  Received call back from pt brother, Aaron Edelman, who reports they are both well at this time and no needs or concerns.  CSW will continue to follow and assist as needed  [name], [facility] Desk#: [phone] Cell#: [phone]

## 2019-08-19 ENCOUNTER — Telehealth (HOSPITAL_COMMUNITY): Payer: Self-pay | Admitting: Licensed Clinical Social Worker

## 2019-08-19 NOTE — Telephone Encounter (Signed)
CSW called pt to confirm appt for Monday at [facility] and see if he needs transportation.  Pt confirmed he is still able to make that appt and would need transport.  CSW able to set up transport through [employer]  Pt then spoke with CSW about some current medical concerns.  Pt reports he fell over the weekend after taking his medications- he thinks this happened Sunday.  Pt states he took his medications and sat watching TV for awhile but when he went to stand up and walk on the porch he started feeling funny and fell to the ground.  Pt states he had a big bump and some bleeding and has felt somewhat weird since.  States he has not had an appetite and was nauseous this morning.  Pt also states he is concerned about his legs.  States he has scabs all on his legs and that he had a blister on one of his pinky toes and now that whole foot is swollen and his pinky toe nail is black.  CSW discussed with triage RN and called pt back to encourage him to go to urgent care to get checked out as his symptoms are concerning for a concussion and/or an infection of his foot.  Pt reports he will try to get checked today- has an urgent care one mile from his home and another MD right down the road.  CSW will continue to follow and assist as needed- requested pt call CSW to update on condition.

## 2019-08-22 ENCOUNTER — Telehealth (HOSPITAL_COMMUNITY): Payer: Self-pay | Admitting: Licensed Clinical Social Worker

## 2019-08-22 NOTE — Telephone Encounter (Signed)
CSW received call from pt brother Aaron Edelman requesting help with getting new EBT card for pt.  Pt lost his debit card and EBT card a few days ago- they have been able to get a new debit card and went to EBT about getting a new card but were unable to because pt address changed since he first received it so has to go through [place].  CSW called DSS and followed prompts to speak with a representative regarding EBT cards- had to leave a VM- awaiting return call.  Pt brother then called CSW back and informed her that pt has fallen again and hit head.  This is the second time this week.  CSW again encouraged pt to go to urgent care or the ED but pt and brother would rather wait till pt is seen by MD on Monday.  CSW encouraged them to consider taking pt to ED prior to that time especially if pt continues to feel poorly.  CSW will continue to follow and assist as needed  [name], [facility] Desk#: [phone] Cell#: [phone]

## 2019-08-25 ENCOUNTER — Other Ambulatory Visit: Payer: Self-pay

## 2019-08-25 ENCOUNTER — Ambulatory Visit (INDEPENDENT_AMBULATORY_CARE_PROVIDER_SITE_OTHER): Payer: Medicaid Other | Admitting: Internal Medicine

## 2019-08-25 ENCOUNTER — Encounter: Payer: Self-pay | Admitting: Internal Medicine

## 2019-08-25 ENCOUNTER — Telehealth (HOSPITAL_COMMUNITY): Payer: Self-pay | Admitting: Licensed Clinical Social Worker

## 2019-08-25 DIAGNOSIS — I5022 Chronic systolic (congestive) heart failure: Secondary | ICD-10-CM | POA: Diagnosis not present

## 2019-08-25 HISTORY — DX: Chronic systolic (congestive) heart failure: I50.22

## 2019-08-25 LAB — CBC WITH DIFFERENTIAL/PLATELET
Basophils Absolute: 0.1 10*3/uL (ref 0.0–0.2)
Basos: 1 %
EOS (ABSOLUTE): 0.1 10*3/uL (ref 0.0–0.4)
Eos: 2 %
Hematocrit: 38.6 % (ref 37.5–51.0)
Hemoglobin: 13.2 g/dL (ref 13.0–17.7)
Immature Grans (Abs): 0 10*3/uL (ref 0.0–0.1)
Immature Granulocytes: 0 %
Lymphocytes Absolute: 1 10*3/uL (ref 0.7–3.1)
Lymphs: 15 %
MCH: 33.9 pg — ABNORMAL HIGH (ref 26.6–33.0)
MCHC: 34.2 g/dL (ref 31.5–35.7)
MCV: 99 fL — ABNORMAL HIGH (ref 79–97)
Monocytes Absolute: 1.3 10*3/uL — ABNORMAL HIGH (ref 0.1–0.9)
Monocytes: 19 %
Neutrophils Absolute: 4.4 10*3/uL (ref 1.4–7.0)
Neutrophils: 63 %
Platelets: 126 10*3/uL — ABNORMAL LOW (ref 150–450)
RBC: 3.89 x10E6/uL — ABNORMAL LOW (ref 4.14–5.80)
RDW: 12.1 % (ref 11.6–15.4)
WBC: 7 10*3/uL (ref 3.4–10.8)

## 2019-08-25 LAB — BASIC METABOLIC PANEL
BUN/Creatinine Ratio: 14 (ref 9–20)
BUN: 14 mg/dL (ref 6–24)
CO2: 21 mmol/L (ref 20–29)
Calcium: 9.3 mg/dL (ref 8.7–10.2)
Chloride: 100 mmol/L (ref 96–106)
Creatinine, Ser: 0.99 mg/dL (ref 0.76–1.27)
GFR calc Af Amer: 98 mL/min/{1.73_m2} (ref >59)
GFR calc non Af Amer: 85 mL/min/{1.73_m2} (ref >59)
Glucose: 100 mg/dL — ABNORMAL HIGH (ref 65–99)
Potassium: 4.1 mmol/L (ref 3.5–5.2)
Sodium: 135 mmol/L (ref 134–144)

## 2019-08-25 NOTE — Patient Instructions (Addendum)
Medication Instructions:  Your physician recommends that you continue on your current medications as directed. Please refer to the Current Medication list given to you today.  Labwork: You will get lab work today:  BMP and CBC.  Testing/Procedures: Your physician has recommended that you have a defibrillator inserted. An implantable cardioverter defibrillator (ICD) is a small device that is placed in your chest or, in rare cases, your abdomen. This device uses electrical pulses or shocks to help control life-threatening, irregular heartbeats that could lead the heart to suddenly stop beating (sudden cardiac arrest). Leads are attached to the ICD that goes into your heart. This is done in the hospital and usually requires an overnight stay. Please see the instruction sheet given to you today for more information.   Follow-Up: You will follow up with device clinic 10-14 days after your procedure for a wound check.  You will follow up with Dr. Ladona Ridgel 91 days after your procedure.   DEFIBRILLATOR INSTRUCTIONS:  COVID TEST-  08/26/2019- Your transportation will call you when they are coming to get you to take you for your covid test.    Please arrive to ADMITTING down the hall from the [facility] main entrance of [facility] at:  5:30 am on 08/29/2019  Use the CHG surgical scrub as directed  Do not eat or drink after midnight prior to procedure  Do not take any medications the morning of the procedure  You will be discharged after your procedure-I have scheduled this with your transportation   If you need a refill on your cardiac medications before your next appointment, please call your pharmacy.    Cardioverter Defibrillator Implantation  An implantable cardioverter defibrillator (ICD) is a small device that is placed under the skin in the chest or abdomen. An ICD consists of a battery, a small computer (pulse generator), and wires (leads) that go into the heart. An ICD is  used to detect and correct two types of dangerous irregular heartbeats (arrhythmias):  A rapid heart rhythm (tachycardia).  An arrhythmia in which the lower chambers of the heart (ventricles) contract in an uncoordinated way (fibrillation). When an ICD detects tachycardia, it sends a low-energy shock to the heart to restore the heartbeat to normal (cardioversion). This signal is usually painless. If cardioversion does not work or if the ICD detects fibrillation, it delivers a high-energy shock to the heart (defibrillation) to restart the heart. This shock may feel like a strong jolt in the chest. Your health care provider may prescribe an ICD if:  You have had an arrhythmia that originated in the ventricles.  Your heart has been damaged by a disease or heart condition. Sometimes, ICDs are programmed to act as a device called a pacemaker. Pacemakers can be used to treat a slow heartbeat (bradycardia) or tachycardia by taking over the heart rate with electrical impulses. Tell a health care provider about:  Any allergies you have.  All medicines you are taking, including vitamins, herbs, eye drops, creams, and over-the-counter medicines.  Any problems you or family members have had with anesthetic medicines.  Any blood disorders you have.  Any surgeries you have had.  Any medical conditions you have.  Whether you are pregnant or may be pregnant. What are the risks? Generally, this is a safe procedure. However, problems may occur, including:  Swelling, bleeding, or bruising.  Infection.  Blood clots.  Damage to other structures or organs, such as nerves, blood vessels, or the heart.  Allergic reactions to  medicines used during the procedure. What happens before the procedure? Staying hydrated Follow instructions from your health care provider about hydration, which may include:  Up to 2 hours before the procedure - you may continue to drink clear liquids, such as water, clear  fruit juice, black coffee, and plain tea. Eating and drinking restrictions Follow instructions from your health care provider about eating and drinking, which may include:  8 hours before the procedure - stop eating heavy meals or foods such as meat, fried foods, or fatty foods.  6 hours before the procedure - stop eating light meals or foods, such as toast or cereal.  6 hours before the procedure - stop drinking milk or drinks that contain milk.  2 hours before the procedure - stop drinking clear liquids. Medicine Ask your health care provider about:  Changing or stopping your normal medicines. This is important if you take diabetes medicines or blood thinners.  Taking medicines such as aspirin and ibuprofen. These medicines can thin your blood. Do not take these medicines before your procedure if your doctor tells you not to. Tests  You may have blood tests.  You may have a test to check the electrical signals in your heart (electrocardiogram, ECG).  You may have imaging tests, such as a chest X-ray. General instructions  For 24 hours before the procedure, stop using products that contain nicotine or tobacco, such as cigarettes and e-cigarettes. If you need help quitting, ask your health care provider.  Plan to have someone take you home from the hospital or clinic.  You may be asked to shower with a germ-killing soap. What happens during the procedure?  To reduce your risk of infection: ? Your health care team will wash or sanitize their hands. ? Your skin will be washed with soap. ? Hair may be removed from the surgical area.  Small monitors will be put on your body. They will be used to check your heart, blood pressure, and oxygen level.  An IV tube will be inserted into one of your veins.  You will be given one or more of the following: ? A medicine to help you relax (sedative). ? A medicine to numb the area (local anesthetic). ? A medicine to make you fall asleep  (general anesthetic).  Leads will be guided through a blood vessel into your heart and attached to your heart muscles. Depending on the ICD, the leads may go into one ventricle or they may go into both ventricles and into an upper chamber of the heart. An X-ray machine (fluoroscope) will be usedto help guide the leads.  A small incision will be made to create a deep pocket under your skin.  The pulse generator will be placed into the pocket.  The ICD will be tested.  The incision will be closed with stitches (sutures), skin glue, or staples.  A bandage (dressing) will be placed over the incision. This procedure may vary among health care providers and hospitals. What happens after the procedure?  Your blood pressure, heart rate, breathing rate, and blood oxygen level will be monitored often until the medicines you were given have worn off.  A chest X-ray will be taken to check that the ICD is in the right place.  You will need to stay in the hospital for 1-2 days so your health care provider can make sure your ICD is working.  Do not drive for 24 hours if you received a sedative. Ask your health care provider when  it is safe for you to drive.  You may be given an identification card explaining that you have an ICD. Summary  An implantable cardioverter defibrillator (ICD) is a small device that is placed under the skin in the chest or abdomen. It is used to detect and correct dangerous irregular heartbeats (arrhythmias).  An ICD consists of a battery, a small computer (pulse generator), and wires (leads) that go into the heart.  When an ICD detects rapid heart rhythm (tachycardia), it sends a low-energy shock to the heart to restore the heartbeat to normal (cardioversion). If cardioversion does not work or if the ICD detects uncoordinated heart contractions (fibrillation), it delivers a high-energy shock to the heart (defibrillation) to restart the heart.  You will need to stay in the  hospital for 1-2 days to make sure your ICD is working. This information is not intended to replace advice given to you by your health care provider. Make sure you discuss any questions you have with your health care provider. Document Released: 09/02/2002 Document Revised: 11/23/2017 Document Reviewed: 12/20/2016 Elsevier Patient Education  2020 [employer].

## 2019-08-25 NOTE — H&P (View-Only) (Signed)
HPI [name] is referred today for consideration of ICD insertion. The patient has a h/o VF arrest and underwent revascularization in 2018. He has persistent LV dysfunction with an EF of 25% despite maximal medical therapy. He has syncope. He recently passed out. Sounds like orthostasis as he has had dizzy spells before and always associated with standing. No palpitations. No edema. No chest pain.  No Known Allergies   Current Outpatient Medications  Medication Sig Dispense Refill  . ASPIRIN ADULT LOW STRENGTH 81 MG EC tablet Take 1 tablet (81 mg total) by mouth daily. 90 tablet 1  . atorvastatin (LIPITOR) 80 MG tablet Take 1 tablet (80 mg total) by mouth daily at 6 PM. 90 tablet 1  . carvedilol (COREG) 6.25 MG tablet Take 1 tablet (6.25 mg total) by mouth 2 (two) times daily with a meal. 180 tablet 3  . furosemide (LASIX) 80 MG tablet Take 1 tablet (80 mg total) by mouth daily. May take additional 40 mg as needed. 90 tablet 1  . losartan (COZAAR) 25 MG tablet Take 25 mg by mouth daily.    . potassium chloride SA (K-DUR) 20 MEQ tablet Take 1 tablet (20 mEq total) by mouth daily. Take 1 tab when you take a dose of lasix as needed 30 tablet 6  . sacubitril-valsartan (ENTRESTO) 49-51 MG Take 1 tablet by mouth 2 (two) times daily. 60 tablet 3   No current facility-administered medications for this visit.      Past Medical History:  Diagnosis Date  . Arthritis    "hands; maybe my toes" (12/20/2017)  . Cardiac arrest (HCC) 11/29/2017   hx VF arrest/notes 12/20/2017  . Coronary artery disease   . GERD (gastroesophageal reflux disease)   . H/O ETOH abuse    /notes 12/20/2017  . High cholesterol   . Hypertension   . Seizures (HCC) ~ 2016; ~ 2017   "I've had a couple; I was at work" (12/20/2017)  . Syncope and collapse    "last few days" (12/20/2017)    ROS:   All systems reviewed and negative except as noted in the HPI.   Past Surgical History:  Procedure Laterality  Date  . CARDIAC CATHETERIZATION    . CORONARY ARTERY BYPASS GRAFT N/A 12/07/2017   Procedure: CORONARY ARTERY BYPASS GRAFTING (CABG) times four using left internal mammary artery and right saphenous vein using endoscope for harvest.;  Surgeon: Kerin PernaVan Trigt, Peter, MD;  [facility];  Service: Open Heart Surgery;  Laterality: N/A;  . IABP INSERTION N/A 11/29/2017   Procedure: IABP Insertion;  Surgeon: [name], [name], MD;  Location: [facility] INVASIVE CV LAB;  Service: Cardiovascular;  Laterality: N/A;  . IABP INSERTION N/A 12/03/2017   Procedure: IABP INSERTION;  Surgeon: Dolores PattyBensimhon, [name], MD;  Location: [facility] INVAS[facility] CV LAB;  Service: Cardiovascular;  Laterality: N/A;  . LEFT HEART CATH AND CORONARY ANGIOGRAPHY N/A 11/29/2017   Procedure: LEFT HEART CATH AND CORONARY ANGIOGRAPHY;  Surgeon: [name], [name], MD;  Location: [facility] INV[facility] LAB;  Service: Cardiovascular;  Laterality: N/A;  . RIGHT HEART CATH N/A 11/29/2017   Procedure: RIGHT HEART CATH;  Surgeon: Dolores PattyBensimhon, [name], MD;  Location: [facility] CV LAB;  Service: Cardiovascular;  Laterality: N/A;  . TEE WITHOUT CARDIOVERSION N/A 12/07/2017   Procedure: TRANSESOPHAGEAL ECHOCARDIOGRAM (TEE);  Surgeon: [name], [name], MD;  [facility];  Service: Open Heart Surgery;  Laterality: N/A;  . TONSILLECTOMY AND ADENOIDECTOMY  ~ 1972  Family History  Problem Relation Age of Onset  . Heart attack Other      Social History   Socioeconomic History  . Marital status: Divorced    Spouse name: Not on file  . Number of children: Not on file  . Years of education: Not on file  . Highest education level: Not on file  Occupational History  . Not on file  Social Needs  . Financial resource strain: Not on file  . Food insecurity    Worry: Not on file    Inability: Not on file  . Transportation needs    Medical: Not on file    Non-medical: Not on file  Tobacco Use  . Smoking status: Never Smoker  . Smokeless tobacco: Never Used   Substance and Sexual Activity  . Alcohol use: Yes    Comment: 12/20/2017 "I quit drinking after I had the heart attack"  . Drug use: No  . Sexual activity: Not Currently  Lifestyle  . Physical activity    Days per week: Not on file    Minutes per session: Not on file  . Stress: Not on file  Relationships  . Social Herbalist on phone: Not on file    Gets together: Not on file    Attends religious service: Not on file    Active member of club or organization: Not on file    Attends meetings of clubs or organizations: Not on file    Relationship status: Not on file  . Intimate partner violence    Fear of current or ex partner: Not on file    Emotionally abused: Not on file    Physically abused: Not on file    Forced sexual activity: Not on file  Other Topics Concern  . Not on file  Social History Narrative  . Not on file     BP 120/68   Pulse 79   Ht 5\' 10"  (1.778 m)   Wt 157 lb 3.2 oz (71.3 kg)   SpO2 99%   BMI 22.56 kg/m   Physical Exam:  Well appearing NAD HEENT: Unremarkable Neck:  No JVD, no thyromegally Lymphatics:  No adenopathy Back:  No CVA tenderness Lungs:  Clear with no wheezes HEART:  Regular rate rhythm, no murmurs, no rubs, no clicks Abd:  soft, positive bowel sounds, no organomegally, no rebound, no guarding Ext:  2 plus pulses, no edema, no cyanosis, no clubbing Skin:  No rashes no nodules Neuro:  CN II through XII intact, motor grossly intact  EKG - nsr with a PVC  Assess/Plan: 1. Chronic systolic heart failure - I discussed the treatment options with the patient and have recommended proceeding with ICD insertion. The risks/benefits/goals/expectations of the procedure were reviewed and he wishes to proceed.   2. ICM - he denies anginal symptoms. He will continue his current meds.  3. Syncope - sounds like orthostasis. He is encouraged to liberalize his sodium intake.  4. ETOH abuse - he denies over consumption. We will follow.   Mikle [name].D.

## 2019-08-25 NOTE — Progress Notes (Signed)
    HPI [name] is referred today for consideration of ICD insertion. The patient has a h/o VF arrest and underwent revascularization in 2018. He has persistent LV dysfunction with an EF of 25% despite maximal medical therapy. He has syncope. He recently passed out. Sounds like orthostasis as he has had dizzy spells before and always associated with standing. No palpitations. No edema. No chest pain.  No Known Allergies   Current Outpatient Medications  Medication Sig Dispense Refill  . ASPIRIN ADULT LOW STRENGTH 81 MG EC tablet Take 1 tablet (81 mg total) by mouth daily. 90 tablet 1  . atorvastatin (LIPITOR) 80 MG tablet Take 1 tablet (80 mg total) by mouth daily at 6 PM. 90 tablet 1  . carvedilol (COREG) 6.25 MG tablet Take 1 tablet (6.25 mg total) by mouth 2 (two) times daily with a meal. 180 tablet 3  . furosemide (LASIX) 80 MG tablet Take 1 tablet (80 mg total) by mouth daily. May take additional 40 mg as needed. 90 tablet 1  . losartan (COZAAR) 25 MG tablet Take 25 mg by mouth daily.    . potassium chloride SA (K-DUR) 20 MEQ tablet Take 1 tablet (20 mEq total) by mouth daily. Take 1 tab when you take a dose of lasix as needed 30 tablet 6  . sacubitril-valsartan (ENTRESTO) 49-51 MG Take 1 tablet by mouth 2 (two) times daily. 60 tablet 3   No current facility-administered medications for this visit.      Past Medical History:  Diagnosis Date  . Arthritis    "hands; maybe my toes" (12/20/2017)  . Cardiac arrest (HCC) 11/29/2017   hx VF arrest/notes 12/20/2017  . Coronary artery disease   . GERD (gastroesophageal reflux disease)   . H/O ETOH abuse    /notes 12/20/2017  . High cholesterol   . Hypertension   . Seizures (HCC) ~ 2016; ~ 2017   "I've had a couple; I was at work" (12/20/2017)  . Syncope and collapse    "last few days" (12/20/2017)    ROS:   All systems reviewed and negative except as noted in the HPI.   Past Surgical History:  Procedure Laterality  Date  . CARDIAC CATHETERIZATION    . CORONARY ARTERY BYPASS GRAFT N/A 12/07/2017   Procedure: CORONARY ARTERY BYPASS GRAFTING (CABG) times four using left internal mammary artery and right saphenous vein using endoscope for harvest.;  Surgeon: Van Trigt, Peter, MD;  [facility];  Service: Open Heart Surgery;  Laterality: N/A;  . IABP INSERTION N/A 11/29/2017   Procedure: IABP Insertion;  Surgeon: End, [name], MD;  [facility] LAB;  Service: Cardiovascular;  Laterality: N/A;  . IABP INSERTION N/A 12/03/2017   Procedure: IABP INSERTION;  Surgeon: [name], [name], MD;  [facility];  Service: Cardiovascular;  Laterality: N/A;  . LEFT HEART CATH AND CORONARY ANGIOGRAPHY N/A 11/29/2017   Procedure: LEFT HEART CATH AND CORONARY ANGIOGRAPHY;  Surgeon: End, [name], MD;  [facility];  Service: Cardiovascular;  Laterality: N/A;  . RIGHT HEART CATH N/A 11/29/2017   Procedure: RIGHT HEART CATH;  Surgeon: [name], [name], MD;  [facility];  Service: Cardiovascular;  Laterality: N/A;  . TEE WITHOUT CARDIOVERSION N/A 12/07/2017   Procedure: TRANSESOPHAGEAL ECHOCARDIOGRAM (TEE);  Surgeon: Van Trigt, Peter, MD;  [facility];  Service: Open Heart Surgery;  Laterality: N/A;  . TONSILLECTOMY AND ADENOIDECTOMY  ~ 1972       Family History  Problem Relation Age of Onset  . Heart attack Other      Social History   Socioeconomic History  . Marital status: Divorced    Spouse name: Not on file  . Number of children: Not on file  . Years of education: Not on file  . Highest education level: Not on file  Occupational History  . Not on file  Social Needs  . Financial resource strain: Not on file  . Food insecurity    Worry: Not on file    Inability: Not on file  . Transportation needs    Medical: Not on file    Non-medical: Not on file  Tobacco Use  . Smoking status: Never Smoker  . Smokeless tobacco: Never Used   Substance and Sexual Activity  . Alcohol use: Yes    Comment: 12/20/2017 "I quit drinking after I had the heart attack"  . Drug use: No  . Sexual activity: Not Currently  Lifestyle  . Physical activity    Days per week: Not on file    Minutes per session: Not on file  . Stress: Not on file  Relationships  . Social Herbalist on phone: Not on file    Gets together: Not on file    Attends religious service: Not on file    Active member of club or organization: Not on file    Attends meetings of clubs or organizations: Not on file    Relationship status: Not on file  . Intimate partner violence    Fear of current or ex partner: Not on file    Emotionally abused: Not on file    Physically abused: Not on file    Forced sexual activity: Not on file  Other Topics Concern  . Not on file  Social History Narrative  . Not on file     BP 120/68   Pulse 79   Ht 5\' 10"  (1.778 m)   Wt 157 lb 3.2 oz (71.3 kg)   SpO2 99%   BMI 22.56 kg/m   Physical Exam:  Well appearing NAD HEENT: Unremarkable Neck:  No JVD, no thyromegally Lymphatics:  No adenopathy Back:  No CVA tenderness Lungs:  Clear with no wheezes HEART:  Regular rate rhythm, no murmurs, no rubs, no clicks Abd:  soft, positive bowel sounds, no organomegally, no rebound, no guarding Ext:  2 plus pulses, no edema, no cyanosis, no clubbing Skin:  No rashes no nodules Neuro:  CN II through XII intact, motor grossly intact  EKG - nsr with a PVC  Assess/Plan: 1. Chronic systolic heart failure - I discussed the treatment options with the patient and have recommended proceeding with ICD insertion. The risks/benefits/goals/expectations of the procedure were reviewed and he wishes to proceed.   2. ICM - he denies anginal symptoms. He will continue his current meds.  3. Syncope - sounds like orthostasis. He is encouraged to liberalize his sodium intake.  4. ETOH abuse - he denies over consumption. We will follow.   Mikle [name].D.

## 2019-08-25 NOTE — Telephone Encounter (Signed)
CSW called [place] [employer] again regarding pt EBT card.  Was able to get a hold of pt case worker Angelica who has placed the order for a new card- reports it will arrive in about 7 days.  Pt confirms that they should be able to afford enough food to last till it arrives.  CSW will continue to follow and assist as needed  [name], [facility] Desk#: [phone] Cell#: [phone]

## 2019-08-26 ENCOUNTER — Other Ambulatory Visit (HOSPITAL_COMMUNITY)
Admission: RE | Admit: 2019-08-26 | Discharge: 2019-08-26 | Disposition: A | Discharge status: Home / Self Care | Payer: Medicaid Other | Source: Ambulatory Visit | Attending: Internal Medicine | Admitting: Internal Medicine

## 2019-08-26 DIAGNOSIS — Z01812 Encounter for preprocedural laboratory examination: Secondary | ICD-10-CM | POA: Insufficient documentation

## 2019-08-26 DIAGNOSIS — Z20828 Contact with and (suspected) exposure to other viral communicable diseases: Secondary | ICD-10-CM | POA: Insufficient documentation

## 2019-08-26 LAB — SARS CORONAVIRUS 2 (TAT 6-24 HRS): SARS Coronavirus 2: NEGATIVE

## 2019-08-26 NOTE — Progress Notes (Signed)
Spoke with patient about missed COVID testing appointment. Patient stated that his transportation was coming to pick him up at 2 and he would be on his way

## 2019-08-28 ENCOUNTER — Telehealth (HOSPITAL_COMMUNITY): Payer: Self-pay | Admitting: Licensed Clinical Social Worker

## 2019-08-28 NOTE — Telephone Encounter (Signed)
CSW received call from pt stating that his social security check was not deposited today and he needs the money to pay rent.  CSW called Social Security office alongside patient to discuss.  Confirmed that payment was supposed to be received today so stayed on the line to confirm with representative what happened to patients check.  Representative reports that patient address and direct deposit information was provided after the deadline that his check went to his previous address.  Representative is issuing a new check and having it sent to the patients new address- will arrive in approximately 7 days.  Pt will inform landlord of mix up and let CSW know if this will be a problem.  CSW will continue to follow and assist as needed  [name], [facility] Desk#: [phone] Cell#: [phone]

## 2019-08-29 ENCOUNTER — Telehealth (HOSPITAL_COMMUNITY): Payer: Self-pay | Admitting: Licensed Clinical Social Worker

## 2019-08-29 ENCOUNTER — Ambulatory Visit (HOSPITAL_COMMUNITY)
Admission: RE | Disposition: A | Discharge status: Home / Self Care | Payer: Medicaid Other | Source: Home / Self Care | Attending: Internal Medicine

## 2019-08-29 ENCOUNTER — Other Ambulatory Visit: Payer: Self-pay

## 2019-08-29 ENCOUNTER — Ambulatory Visit (HOSPITAL_COMMUNITY)
Admission: RE | Admit: 2019-08-29 | Discharge: 2019-08-30 | Disposition: A | Discharge status: Home / Self Care | Payer: Medicaid Other | Attending: Internal Medicine | Admitting: Internal Medicine

## 2019-08-29 ENCOUNTER — Encounter (HOSPITAL_COMMUNITY): Payer: Self-pay | Admitting: Internal Medicine

## 2019-08-29 DIAGNOSIS — Z7982 Long term (current) use of aspirin: Secondary | ICD-10-CM | POA: Insufficient documentation

## 2019-08-29 DIAGNOSIS — E7800 Pure hypercholesterolemia, unspecified: Secondary | ICD-10-CM | POA: Insufficient documentation

## 2019-08-29 DIAGNOSIS — Z951 Presence of aortocoronary bypass graft: Secondary | ICD-10-CM | POA: Diagnosis not present

## 2019-08-29 DIAGNOSIS — Z006 Encounter for examination for normal comparison and control in clinical research program: Secondary | ICD-10-CM | POA: Insufficient documentation

## 2019-08-29 DIAGNOSIS — Z79899 Other long term (current) drug therapy: Secondary | ICD-10-CM | POA: Insufficient documentation

## 2019-08-29 DIAGNOSIS — Z8249 Family history of ischemic heart disease and other diseases of the circulatory system: Secondary | ICD-10-CM | POA: Diagnosis not present

## 2019-08-29 DIAGNOSIS — R55 Syncope and collapse: Secondary | ICD-10-CM | POA: Insufficient documentation

## 2019-08-29 DIAGNOSIS — Z95810 Presence of automatic (implantable) cardiac defibrillator: Secondary | ICD-10-CM | POA: Diagnosis present

## 2019-08-29 DIAGNOSIS — I469 Cardiac arrest, cause unspecified: Secondary | ICD-10-CM | POA: Insufficient documentation

## 2019-08-29 DIAGNOSIS — I252 Old myocardial infarction: Secondary | ICD-10-CM | POA: Insufficient documentation

## 2019-08-29 DIAGNOSIS — I255 Ischemic cardiomyopathy: Secondary | ICD-10-CM | POA: Insufficient documentation

## 2019-08-29 DIAGNOSIS — I110 Hypertensive heart disease with heart failure: Secondary | ICD-10-CM | POA: Insufficient documentation

## 2019-08-29 DIAGNOSIS — I5022 Chronic systolic (congestive) heart failure: Secondary | ICD-10-CM | POA: Insufficient documentation

## 2019-08-29 DIAGNOSIS — K219 Gastro-esophageal reflux disease without esophagitis: Secondary | ICD-10-CM | POA: Diagnosis not present

## 2019-08-29 DIAGNOSIS — M1990 Unspecified osteoarthritis, unspecified site: Secondary | ICD-10-CM | POA: Diagnosis not present

## 2019-08-29 HISTORY — DX: Atherosclerotic heart disease of native coronary artery without angina pectoris: I25.10

## 2019-08-29 HISTORY — DX: Ischemic cardiomyopathy: I25.5

## 2019-08-29 HISTORY — PX: ICD IMPLANT: EP1208

## 2019-08-29 LAB — SURGICAL PCR SCREEN
MRSA, PCR: POSITIVE — AB
Staphylococcus aureus: POSITIVE — AB

## 2019-08-29 SURGERY — ICD IMPLANT

## 2019-08-29 MED ORDER — CARVEDILOL 6.25 MG PO TABS
6.2500 mg | ORAL_TABLET | Freq: Two times a day (BID) | ORAL | Status: DC
  Administered 2019-08-29 – 2019-08-30 (×3): 6.25 mg via ORAL
  Filled 2019-08-29 (×3): qty 1

## 2019-08-29 MED ORDER — CHLORHEXIDINE GLUCONATE 4 % EX LIQD: 60.0000 mL | Freq: Once | CUTANEOUS | Status: DC

## 2019-08-29 MED ORDER — SODIUM CHLORIDE 0.9 % IV SOLN
INTRAVENOUS | Status: AC
  Filled 2019-08-29: qty 2

## 2019-08-29 MED ORDER — HEPARIN (PORCINE) IN NACL 1000-0.9 UT/500ML-% IV SOLN
INTRAVENOUS | Status: AC
  Filled 2019-08-29: qty 500

## 2019-08-29 MED ORDER — IBUTILIDE FUMARATE 1 MG/10ML IV SOLN
INTRAVENOUS | Status: AC
  Filled 2019-08-29: qty 10

## 2019-08-29 MED ORDER — ONDANSETRON HCL 4 MG/2ML IJ SOLN: 4.0000 mg | Freq: Four times a day (QID) | INTRAMUSCULAR | Status: DC | PRN

## 2019-08-29 MED ORDER — POTASSIUM CHLORIDE CRYS ER 20 MEQ PO TBCR: 20.0000 meq | EXTENDED_RELEASE_TABLET | Freq: Every day | ORAL | Status: DC

## 2019-08-29 MED ORDER — NALOXONE HCL 0.4 MG/ML IJ SOLN
INTRAMUSCULAR | Status: AC
  Filled 2019-08-29: qty 1

## 2019-08-29 MED ORDER — FENTANYL CITRATE (PF) 100 MCG/2ML IJ SOLN
INTRAMUSCULAR | Status: AC
  Filled 2019-08-29: qty 2

## 2019-08-29 MED ORDER — FUROSEMIDE 80 MG PO TABS
80.0000 mg | ORAL_TABLET | Freq: Every day | ORAL | Status: DC
  Administered 2019-08-29 – 2019-08-30 (×2): 80 mg via ORAL
  Filled 2019-08-29 (×2): qty 1

## 2019-08-29 MED ORDER — SODIUM CHLORIDE 0.9 % IV SOLN
INTRAVENOUS | Status: DC
  Administered 2019-08-29: 06:00:00 via INTRAVENOUS

## 2019-08-29 MED ORDER — MIDAZOLAM HCL 5 MG/5ML IJ SOLN
INTRAMUSCULAR | Status: AC
  Filled 2019-08-29: qty 5

## 2019-08-29 MED ORDER — MUPIROCIN 2 % EX OINT
1.0000 "application " | TOPICAL_OINTMENT | Freq: Two times a day (BID) | CUTANEOUS | Status: DC
  Administered 2019-08-29 – 2019-08-30 (×2): 1 via NASAL
  Filled 2019-08-29: qty 22

## 2019-08-29 MED ORDER — SACUBITRIL-VALSARTAN 49-51 MG PO TABS
1.0000 | ORAL_TABLET | Freq: Two times a day (BID) | ORAL | Status: DC
  Administered 2019-08-29 – 2019-08-30 (×3): 1 via ORAL
  Filled 2019-08-29 (×3): qty 1

## 2019-08-29 MED ORDER — SODIUM CHLORIDE 0.9 % IV SOLN
INTRAVENOUS | Status: DC | PRN
  Administered 2019-08-29: 1000 mL via INTRAVENOUS

## 2019-08-29 MED ORDER — METOPROLOL TARTRATE 5 MG/5ML IV SOLN
INTRAVENOUS | Status: AC
  Filled 2019-08-29: qty 5

## 2019-08-29 MED ORDER — ACETAMINOPHEN 325 MG PO TABS: 325.0000 mg | ORAL_TABLET | ORAL | Status: DC | PRN

## 2019-08-29 MED ORDER — MUPIROCIN 2 % EX OINT
1.0000 "application " | TOPICAL_OINTMENT | Freq: Once | CUTANEOUS | Status: AC
  Administered 2019-08-29: 1 via TOPICAL
  Filled 2019-08-29: qty 22

## 2019-08-29 MED ORDER — LIDOCAINE HCL 1 % IJ SOLN
INTRAMUSCULAR | Status: AC
  Filled 2019-08-29: qty 60

## 2019-08-29 MED ORDER — CEFAZOLIN SODIUM-DEXTROSE 2-4 GM/100ML-% IV SOLN
2.0000 g | INTRAVENOUS | Status: AC
  Administered 2019-08-29: 08:00:00 2 g via INTRAVENOUS

## 2019-08-29 MED ORDER — HEPARIN (PORCINE) IN NACL 2000-0.9 UNIT/L-% IV SOLN
INTRAVENOUS | Status: DC | PRN
  Administered 2019-08-29: 500 mL

## 2019-08-29 MED ORDER — CEFAZOLIN SODIUM-DEXTROSE 2-4 GM/100ML-% IV SOLN
INTRAVENOUS | Status: AC
  Filled 2019-08-29: qty 100

## 2019-08-29 MED ORDER — ASPIRIN EC 81 MG PO TBEC
81.0000 mg | DELAYED_RELEASE_TABLET | Freq: Every day | ORAL | Status: DC
  Administered 2019-08-29 – 2019-08-30 (×2): 81 mg via ORAL
  Filled 2019-08-29 (×3): qty 1

## 2019-08-29 MED ORDER — FENTANYL CITRATE (PF) 100 MCG/2ML IJ SOLN
INTRAMUSCULAR | Status: DC | PRN
  Administered 2019-08-29: 12.5 ug via INTRAVENOUS
  Administered 2019-08-29: 25 ug via INTRAVENOUS
  Administered 2019-08-29: 12.5 ug via INTRAVENOUS
  Administered 2019-08-29: 25 ug via INTRAVENOUS
  Administered 2019-08-29: 12.5 ug via INTRAVENOUS

## 2019-08-29 MED ORDER — CHLORHEXIDINE GLUCONATE CLOTH 2 % EX PADS: 6.0000 | MEDICATED_PAD | Freq: Every day | CUTANEOUS | Status: DC

## 2019-08-29 MED ORDER — POTASSIUM CHLORIDE CRYS ER 20 MEQ PO TBCR: 20.0000 meq | EXTENDED_RELEASE_TABLET | Freq: Every day | ORAL | Status: DC | PRN

## 2019-08-29 MED ORDER — CEFAZOLIN SODIUM-DEXTROSE 1-4 GM/50ML-% IV SOLN
1.0000 g | Freq: Four times a day (QID) | INTRAVENOUS | Status: AC
  Administered 2019-08-29 – 2019-08-30 (×3): 1 g via INTRAVENOUS
  Filled 2019-08-29 (×3): qty 50

## 2019-08-29 MED ORDER — FUROSEMIDE 40 MG PO TABS: 40.0000 mg | ORAL_TABLET | Freq: Every day | ORAL | Status: DC | PRN

## 2019-08-29 MED ORDER — MIDAZOLAM HCL 5 MG/5ML IJ SOLN
INTRAMUSCULAR | Status: DC | PRN
  Administered 2019-08-29: 1 mg via INTRAVENOUS
  Administered 2019-08-29: 2 mg via INTRAVENOUS
  Administered 2019-08-29 (×2): 1 mg via INTRAVENOUS

## 2019-08-29 MED ORDER — SODIUM CHLORIDE 0.9 % IV SOLN
80.0000 mg | INTRAVENOUS | Status: AC
  Administered 2019-08-29: 80 mg
  Filled 2019-08-29: qty 2

## 2019-08-29 MED ORDER — METOPROLOL TARTRATE 5 MG/5ML IV SOLN
INTRAVENOUS | Status: DC | PRN
  Administered 2019-08-29: 5 mg via INTRAVENOUS

## 2019-08-29 MED ORDER — LIDOCAINE HCL (PF) 1 % IJ SOLN
INTRAMUSCULAR | Status: DC | PRN
  Administered 2019-08-29: 60 mL

## 2019-08-29 MED ORDER — ATORVASTATIN CALCIUM 80 MG PO TABS
80.0000 mg | ORAL_TABLET | Freq: Every day | ORAL | Status: DC
  Administered 2019-08-29: 80 mg via ORAL
  Filled 2019-08-29: qty 1

## 2019-08-29 SURGICAL SUPPLY — 6 items
CABLE SURGICAL S-101-97-12 (CABLE) ×3 IMPLANT
ICD VIGILANT VR D232 (Pacemaker) ×2 IMPLANT
LEAD RELIANCE G DF4 0293 (Lead) ×2 IMPLANT
PAD PRO RADIOLUCENT 2001M-C (PAD) ×3 IMPLANT
SHEATH 9FR PRELUDE SNAP 13 (SHEATH) ×2 IMPLANT
TRAY PACEMAKER INSERTION (PACKS) ×3 IMPLANT

## 2019-08-29 NOTE — Progress Notes (Signed)
CSW consulted with [place] Social Worker with the [facility] regarding patient's lack of running water in home. She reports patient recently moved and his check was delayed, therefore he will have money next week to pay for water if he chooses to do so. Eliezer Lofts will follow up with patient regarding his plan at discharge and continue to follow after discharge for needs.   [place], [place]

## 2019-08-29 NOTE — Discharge Summary (Signed)
ELECTROPHYSIOLOGY PROCEDURE DISCHARGE SUMMARY    Patient ID: Jose CedarWesley Clay Horsey,  MRN: [id number], DOB/AGE: 56/04/1961 56 y.o.  Admit date: 08/29/2019 Discharge date: 08/30/2019   Primary Care Physician: Patient, No Pcp Per  Primary Cardiologist: Dr. [name]  Electrophysiologist: Dr. [name]  Primary Diagnosis:  Chronic systolic CHF  Secondary Diagnosis: CAD H/o VF arrest Ischemic cardiomyopathy  No Known Allergies   Procedures This Admission:  1.  Implantation of a [employer] single chamber ICD on 08/29/2019 by Dr. [name].  The patient received a Designer, [employer] model number D232 ICD with model number [id number] right ventricular lead.  DFT's were deferred at time of implant was successful at 6231 J.  There were no immediate post procedure complications. 2.  CXR on 08/30/2019 demonstrated appropriate lead positioning without pneumothorax.   Brief HPI: [name] is a 58 y.o. male was referred to electrophysiology in the outpatient setting for consideration of ICD implantation.  Past medical history includes above.  The patient has persistent LV dysfunction despite guideline directed therapy.  Risks, benefits, and alternatives to ICD implantation were reviewed with the patient who wished to proceed.   Hospital Course:  The patient was admitted and underwent implantation of a [employer] single chamber ICD with details as outlined above. Left chest was without hematoma or ecchymosis.  The device was interrogated and found to be functioning normally.  Frequent PVCs were noted on telemetry and we will arrange for a 5 day Zio Patch to quantify PVC burden.  CXR was obtained and demonstrated no pneumothorax status post device implantation.  Wound care, arm mobility, and restrictions were reviewed with the patient.  The patient was examined and considered stable for discharge to home.   The patient's discharge medications include an ACE-I/ARB/ARNI [name](Entresto) and beta blocker  (Coreg).   Physical Exam: Vitals:   08/30/19 0133 08/30/19 0431 08/30/19 0531 08/30/19 0908  BP: 129/89 102/71  111/78  Pulse: 72 89  96  Resp: 18 20    Temp: 97.6 F (36.4 C) 97.7 F (36.5 C)    TempSrc: Oral Oral    SpO2: 98% 98%    Weight:   67.9 kg   Height:       Labs:   Lab Results  Component Value Date   WBC 7.0 08/25/2019   HGB 13.2 08/25/2019   HCT 38.6 08/25/2019   MCV 99 (H) 08/25/2019   PLT 126 (L) 08/25/2019    Recent Labs  Lab 08/25/19 1104  NA 135  K 4.1  CL 100  CO2 21  BUN 14  CREATININE 0.99  CALCIUM 9.3  GLUCOSE 100*    Discharge Medications:  Allergies as of 08/30/2019   No Known Allergies     Medication List    TAKE these medications   Aspirin Adult Low Strength 81 MG EC tablet Generic drug: aspirin Take 1 tablet (81 mg total) by mouth daily.   atorvastatin 80 MG tablet Commonly known as: LIPITOR Take 1 tablet (80 mg total) by mouth daily at 6 PM.   carvedilol 6.25 MG tablet Commonly known as: COREG Take 1 tablet (6.25 mg total) by mouth 2 (two) times daily with a meal.   Entresto 49-51 MG Generic drug: sacubitril-valsartan Take 1 tablet by mouth 2 (two) times daily.   furosemide 80 MG tablet Commonly known as: LASIX Take 1 tablet (80 mg total) by mouth daily. May take additional 40 mg as needed. What changed: Another medication with the same name was  changed. Make sure you understand how and when to take each.   furosemide 40 MG tablet Commonly known as: LASIX Take 1 tablet (40 mg total) by mouth daily as needed. What changed:   when to take this  reasons to take this   potassium chloride SA 20 MEQ tablet Commonly known as: K-DUR Take 1 tablet (20 mEq total) by mouth daily. Take 1 tab when you take a dose of lasix as needed What changed: when to take this       Disposition:  Discharge Instructions    ([place]) Call MD:  Anytime you have any of the following symptoms: 1) 3 pound weight gain in 24  hours or 5 pounds in 1 week 2) shortness of breath, with or without a dry hacking cough 3) swelling in the hands, feet or stomach 4) if you have to sleep on extra pillows at night in order to breathe.   Complete by: As directed    Call MD for:  difficulty breathing, headache or visual disturbances   Complete by: As directed    Call MD for:  redness, tenderness, or signs of infection (pain, swelling, redness, odor or green/yellow discharge around incision site)   Complete by: As directed    Diet - low sodium heart healthy   Complete by: As directed    Increase activity slowly   Complete by: As directed      Follow-up Information    Evans Lance, MD Follow up on 12/01/2019.   Specialty: Cardiology Why: at 230 pm for 3 month ICD check Contact information: [address] [place] [address] [phone]        [facility] Follow up on 09/16/2019.   Why: at 1030 for post ICD wound check Contact information: [place] [phone]          Duration of Discharge Encounter: Greater than 30 minutes including physician time.  Signed, Murray Hodgkins, NP  08/30/2019 10:32 AM

## 2019-08-29 NOTE — Telephone Encounter (Signed)
CSW received call from inpatient social worker with concerns about patient water being off.  CSW called pt to discuss.  Pt reports that water has been off for 2-3 days and he is unsure why.  Doesn't know if its included in his rent or not but has been on for the past few weeks since he moved in.  CSW called [facility] to inquire- they report that water was turned off due to nonpayment from last tenant and that pt would need to restart utilities.  Would need to come to town hall with a copy of the lease and his ID as well as $125 for start up fees.  They could not turn on pt water until Tuesday because the workers who turn on the water are gone for the day and the office is closed Monday for the holiday.  CSW relayed this information to patient.  CSW will be out of the office next week but informed coworker Raquel Sarna in the heart failure clinic to follow up with patient next week to help him start water.  CSW will continue to follow and assist as needed  [name], [facility] Desk#: [phone] Cell#: [phone]

## 2019-08-29 NOTE — Progress Notes (Signed)
Orthopedic Tech Progress Note Patient Details:  [name] 1961-04-03 [id number] Patient has arm sling Patient ID: [name], male   DOB: [birth year], 58 y.o.   MRN: [id number]   [name] 08/29/2019, 10:01 AM

## 2019-08-30 ENCOUNTER — Other Ambulatory Visit: Payer: Self-pay | Admitting: Nurse Practitioner

## 2019-08-30 ENCOUNTER — Ambulatory Visit (HOSPITAL_COMMUNITY): Payer: Medicaid Other

## 2019-08-30 ENCOUNTER — Encounter (HOSPITAL_COMMUNITY): Payer: Self-pay | Admitting: Nurse Practitioner

## 2019-08-30 DIAGNOSIS — I110 Hypertensive heart disease with heart failure: Secondary | ICD-10-CM | POA: Diagnosis not present

## 2019-08-30 DIAGNOSIS — E7800 Pure hypercholesterolemia, unspecified: Secondary | ICD-10-CM | POA: Diagnosis not present

## 2019-08-30 DIAGNOSIS — I252 Old myocardial infarction: Secondary | ICD-10-CM | POA: Diagnosis not present

## 2019-08-30 DIAGNOSIS — K219 Gastro-esophageal reflux disease without esophagitis: Secondary | ICD-10-CM | POA: Diagnosis not present

## 2019-08-30 DIAGNOSIS — I469 Cardiac arrest, cause unspecified: Secondary | ICD-10-CM | POA: Diagnosis not present

## 2019-08-30 DIAGNOSIS — I493 Ventricular premature depolarization: Secondary | ICD-10-CM

## 2019-08-30 DIAGNOSIS — Z95810 Presence of automatic (implantable) cardiac defibrillator: Secondary | ICD-10-CM

## 2019-08-30 DIAGNOSIS — I255 Ischemic cardiomyopathy: Secondary | ICD-10-CM | POA: Diagnosis not present

## 2019-08-30 DIAGNOSIS — Z006 Encounter for examination for normal comparison and control in clinical research program: Secondary | ICD-10-CM | POA: Diagnosis not present

## 2019-08-30 DIAGNOSIS — I5022 Chronic systolic (congestive) heart failure: Secondary | ICD-10-CM | POA: Diagnosis not present

## 2019-08-30 DIAGNOSIS — M1990 Unspecified osteoarthritis, unspecified site: Secondary | ICD-10-CM | POA: Diagnosis not present

## 2019-08-30 DIAGNOSIS — Z950 Presence of cardiac pacemaker: Secondary | ICD-10-CM | POA: Diagnosis not present

## 2019-08-30 DIAGNOSIS — Z7982 Long term (current) use of aspirin: Secondary | ICD-10-CM | POA: Diagnosis not present

## 2019-08-30 DIAGNOSIS — Z79899 Other long term (current) drug therapy: Secondary | ICD-10-CM | POA: Diagnosis not present

## 2019-08-30 DIAGNOSIS — R55 Syncope and collapse: Secondary | ICD-10-CM | POA: Diagnosis not present

## 2019-08-30 MED ORDER — FUROSEMIDE 40 MG PO TABS: 40.0000 mg | ORAL_TABLET | Freq: Every day | ORAL | Status: DC | PRN

## 2019-08-30 NOTE — TOC Transition Note (Signed)
Transition of Care [facility]) - CM/SW Discharge Note   Patient Details  Name: [name] MRN: [id number] Date of Birth: September 27, 1961  [facility]) CM/SW Contact:  Wende Neighbors, LCSW Phone Number: 08/30/2019, 2:03 PM   Clinical Narrative:   Patient needs assistant returning home. RN verified patients address and per RN it is accurate. CSW spoke with Heart Failure social worker and she stated that a Education officer, museum at the clinic will reach out to patient to assist with turning on water at residence Tuesday if needed. CSW gave taxi voucher to RN          Patient Goals and CMS Choice        Discharge Placement                       Discharge Plan and Services                                     Social Determinants of Health (SDOH) Interventions     Readmission Risk Interventions No flowsheet data found.

## 2019-08-30 NOTE — Progress Notes (Signed)
Patient Name: [name]      SUBJECTIVE:Admitted for ICD for primary prevention- but recent syncope- In setting of ischemic cardiomyopathy and prior CABG w persistent LV dysfunction   The patient denies chest pain, shortness of breath,      Past Medical History:  Diagnosis Date  . Arthritis    "hands; maybe my toes" (12/20/2017)  . Cardiac arrest ([place]) 11/29/2017   hx VF arrest/notes 12/20/2017  . Coronary artery disease   . GERD (gastroesophageal reflux disease)   . H/O ETOH abuse    /notes 12/20/2017  . High cholesterol   . Hypertension   . Seizures ([place]) ~ 2016; ~ 2017   "I've had a couple; I was at work" (12/20/2017)  . Syncope and collapse    "last few days" (12/20/2017)    Scheduled Meds:  Scheduled Meds: . aspirin EC  81 mg Oral Daily  . atorvastatin  80 mg Oral q1800  . carvedilol  6.25 mg Oral BID WC  . Chlorhexidine Gluconate Cloth  6 each Topical Q0600  . furosemide  80 mg Oral Daily  . mupirocin ointment  1 application Nasal BID  . sacubitril-valsartan  1 tablet Oral BID   Continuous Infusions: . sodium chloride Stopped (08/30/19 0621)   sodium chloride, acetaminophen, furosemide, ondansetron (ZOFRAN) IV, potassium chloride SA    PHYSICAL EXAM Vitals:   08/29/19 2130 08/30/19 0133 08/30/19 0431 08/30/19 0531  BP: 139/89 129/89 102/71   Pulse: 77 72 89   Resp:  18 20   Temp:  97.6 F (36.4 C) 97.7 F (36.5 C)   TempSrc:  Oral Oral   SpO2:  98% 98%   Weight:    67.9 kg  Height:         BP 102/71 (BP Location: Right Arm)   Pulse 89   Temp 97.7 F (36.5 C) (Oral)   Resp 20   Ht 5\' 10"  (1.778 m)   Wt 67.9 kg   SpO2 98%   BMI 21.48 kg/m   Well developed and well nourished in no acute distress HENT normal Neck supple with JVP-flat Clear Pocket without  hematoma, swelling or tenderness  Regular rate and rhythm, no  gallop No murmur Abd-soft with active BS No Clubbing cyanosis  edema Skin-warm and dry A &  Oriented  Grossly normal sensory and motor function     ECG personally reviewed sinus with freq PVCs   Intake/Output Summary (Last 24 hours) at 08/30/2019 0824 Last data filed at 08/30/2019 0809 Gross per 24 hour  Intake 1305.49 ml  Output 3100 ml  Net -1794.51 ml    LABS: Basic Metabolic Panel: Recent Labs  Lab 08/25/19 1104  NA 135  K 4.1  CL 100  CO2 21  GLUCOSE 100*  BUN 14  CREATININE 0.99  CALCIUM 9.3   Cardiac Enzymes: No results for input(s): CKTOTAL, CKMB, CKMBINDEX, TROPONINI in the last 72 hours. CBC: Recent Labs  Lab 08/25/19 1104  WBC 7.0  NEUTROABS 4.4  HGB 13.2  HCT 38.6  MCV 99*  PLT 126*    BNP (last 3 results) Recent Labs    02/25/19 0919 05/20/19 0917 07/16/19 1223  BNP 135.4* 104.2* 125.2*     Anemia Panel: No results for input(s): VITAMINB12, FOLATE, FERRITIN, TIBC, IRON, RETICCTPCT in the last 72 hours.   Device Interrogation: normal device function  Chest Xray personally reviewed  Septal lead position otherwise without issue   ASSESSMENT AND PLAN:  Active Problems:   ICD (implantable cardioverter-defibrillator) in place Ischemic cardioimyopathy s/p CABG Syncope PVCs freq  Underwent ICD implant yesterday Will need outpt zio patch ( 5 days) for PVC quantitation     Signed, Sherryl MangesSteven Avid Guillette MD  08/30/2019

## 2019-08-30 NOTE — Progress Notes (Signed)
Patient alert and oriented, iv and tele d/c, incision site level 0, d/c instruction explain and given to the patient, all questions answered, patient verbalized understanding. Patient d/c home via [facility]

## 2019-08-30 NOTE — Care Management (Signed)
Unable to find [facility] provider accepting Medicaid. Patient is closely followed by [facility] Clinic CSW. Will message Tammy Sours about possibility of starting paramedicine for this patient. No other CM needs at this time.

## 2019-08-30 NOTE — Plan of Care (Signed)
  Problem: Education: Goal: Knowledge of cardiac device and self-care will improve Outcome: Progressing   Problem: Education: Goal: Knowledge of General Education information will improve Description: Including pain rating scale, medication(s)/side effects and non-pharmacologic comfort measures Outcome: Progressing   Problem: Health Behavior/Discharge Planning: Goal: Ability to manage health-related needs will improve Outcome: Progressing

## 2019-08-30 NOTE — Discharge Instructions (Signed)
After Your ICD (Implantable Cardiac Defibrillator)    You have a [employer] ICD   Do not lift your arm above shoulder height for 1 week after your procedure. After 7 days, you may progress as below.                 9/11                         9/12                          9/13                         9/14                    Do not lift, push, pull, or carry anything over 10 pounds with the affected arm until 6 weeks (Friday October 10, 2019 ) after your procedure.    Do not drive until you have been seen for your wound check, or as long as instructed by your healthcare provider.    Monitor your defibrillator site for redness, swelling, and drainage. Call the device clinic at [phone] if you experience these symptoms or fever/chills.   If your incision is sealed with Steri-strips or staples. You may shower 7 days after your procedure and wash your incision with soap and water as long as it is healed. If your incision is closed with Dermabond/Surgical glue. You may shower 1 day after your pacemaker implant and wash your incision with soap and water. Avoid lotions, ointments, or perfumes over your incision until it is well-healed.   You may use a hot tub or a pool AFTER your wound check appointment if the incision is completely closed.   Your ICD  may be MRI compatible. This will be discussed at your next office visit/wound check.    Your ICD is designed to protect you from life threatening heart rhythms. Because of this, you may receive a shock.   o 1 shock with no symptoms:  Call the office during business hours. o 1 shock with symptoms (chest pain, chest pressure, dizziness, lightheadedness, shortness of breath, overall feeling unwell):  Call 911. o If you experience 2 or more shocks in 24 hours:  Call 911. o If you receive a shock, you should not drive for 6 months per the [place] DMV IF you receive appropriate therapy from your ICD.    ICD Alerts:  Some alerts are  vibratory and others beep. These are NOT emergencies. Please call our office to let us know. If this occurs at night or on weekends, it can wait until the next business day. Send a remote transmission.   If your device is capable of reading fluid status (for heart failure), you will be offered monthly monitoring to review this with you.    Remote monitoring is used to monitor your ICD from home. This monitoring is scheduled every 91 days by our office. It allows [place] to keep an eye on the functioning of your device to ensure it is working properly. You will routinely see your Electrophysiologist annually (more often if necessary).    Cardioverter Defibrillator Implantation, Care After This sheet gives you information about how to care for yourself after your procedure. Your health care provider may also give you more specific instructions. If you have problems or questions, contact your health  care provider. What can I expect after the procedure? After the procedure, it is common to have:  Some pain. It may last a few days.  A slight bump over the skin where the device was placed. Sometimes, it is possible to feel the device under the skin. This is normal.  During the months and years after your procedure, your health care provider will check the device, the leads, and the battery every few months. Eventually, when the battery is low, the device will be replaced.  You should receive your defibrillator ID card for your new device in the next 4-8 weeks.  Follow these instructions at home: Medicines  Take over-the-counter and prescription medicines only as told by your health care provider.  If you were prescribed an antibiotic medicine, take it as told by your health care provider. Do not stop taking the antibiotic even if you start to feel better. Incision care        Follow instructions from your health care provider about how to take care of your incision area. Make sure you: ? Leave  stitches (sutures), skin glue, or adhesive strips in place. These skin closures may need to stay in place for 2 weeks or longer. If adhesive strip edges start to loosen and curl up, you may trim the loose edges. Do not remove adhesive strips completely unless your health care provider tells you to do that.  Check your incision area every day for signs of infection. Check for: ? More redness, swelling, or pain. ? More fluid or blood. ? Warmth. ? Pus or a bad smell.  Do not use lotions or ointments near the incision area unless told by your health care provider.  Keep the incision area clean and dry for 7 days after the procedure or for as long as told by your health care provider. It takes several weeks for the incision site to heal completely.  Do not take baths, swim, or use a hot tub until your health care provider approves. Activity  Try to walk a little every day. Exercising is important after this procedure. Also, use your shoulder on the side of the defibrillator in daily tasks that do not require a lot of motion.  For at least 1 week: ? Do not lift your upper arm above your shoulders. This means no tennis, golf, or swimming for this period of time. If you tend to sleep with your arm above your head, use a restraint to prevent this during sleep.  For at least 6 weeks: ? Avoid sudden jerking, pulling, or chopping movements that pull your upper arm far away from your body.  Ask your health care provider when you may go back to work.  Check with your health care provider before you start to drive or play sports. Electric and magnetic fields  Tell all health care providers that you have a defibrillator. This may prevent them from giving you an MRI scan because strong magnets are used for that test.  If you must pass through a metal detector, quickly walk through it. Do not stop under the detector, and do not stand near it.  Avoid places or objects that have a strong electric or  magnetic field, including: ? Airport Actuarysecurity gates. At the airport, let officials know that you have a defibrillator. Your defibrillator ID card will let you be checked in a way that is safe for you and will not damage your defibrillator. Also, do not let a security person wave a  magnetic wand near your defibrillator. That can make it stop working. ? Power plants. ? Large electrical generators. ? Anti-theft systems or electronic article surveillance (EAS). ? Radiofrequency transmission towers, such as cell phone and radio towers.  Do not use amateur (ham) radio equipment or electric (arc) welding torches. Some devices are safe to use if held at least 12 inches (30 cm) from your defibrillator. These include power tools, lawn mowers, and speakers. If you are unsure if something is safe to use, ask your health care provider.  Do not use MP3 player headphones. They have magnets.  You may safely use electric blankets, heating pads, computers, and microwave ovens.  When using your cell phone, hold it to the ear that is on the opposite side from the defibrillator. Do not leave your cell phone in a pocket over the defibrillator. General instructions  Follow diet instructions from your health care provider, if this applies.  Always keep your defibrillator ID card with you. The card should list the implant date, device model, and manufacturer. Consider wearing a medical alert bracelet or necklace.  Have your defibrillator checked every 3-6 months or as often as told by your health care provider. Most defibrillators last for 4-8 years.  Keep all follow-up visits as told by your health care provider. This is important for your health care provider to make sure your chest is healing the way it should. Ask your health care provider when you should come back to have your stitches or staples taken out. Contact a health care provider if:  You gain weight suddenly.  Your legs or feet swell more than they  have before.  It feels like your heart is fluttering or skipping beats (heart palpitations).  You have more redness, swelling, or pain around your incision.  You have more fluid or blood coming from your incision.  Your incision feels warm to the touch.  You have pus or a bad smell coming from your incision.  You have a fever. Get help right away if:  You have chest pain.  You feel more than one shock.  You feel more short of breath than you have felt before.  You feel more light-headed than you have felt before.  Your incision starts to open up. This information is not intended to replace advice given to you by your health care provider. Make sure you discuss any questions you have with your health care provider.

## 2019-09-01 NOTE — Interval H&P Note (Signed)
History and Physical Interval Note:  09/01/2019 9:42 PM  [name]  has presented today for surgery, with the diagnosis of cardiomyopathy.  The various methods of treatment have been discussed with the patient and family. After consideration of risks, benefits and other options for treatment, the patient has consented to  Procedure(s): ICD IMPLANT (N/A) as a surgical intervention.  The patient's history has been reviewed, patient examined, no change in status, stable for surgery.  I have reviewed the patient's chart and labs.  Questions were answered to the patient's satisfaction.     [name]

## 2019-09-02 ENCOUNTER — Telehealth (HOSPITAL_COMMUNITY): Payer: Self-pay | Admitting: Licensed Clinical Social Worker

## 2019-09-02 MED FILL — Lidocaine HCl Local Inj 1%: INTRAMUSCULAR | Qty: 60 | Status: AC

## 2019-09-02 MED FILL — Naloxone HCl Inj 0.4 MG/ML: INTRAMUSCULAR | Qty: 1 | Status: AC

## 2019-09-02 NOTE — Telephone Encounter (Signed)
CSW received return call from patient who states he went to [facility] and completed paperwork for [employer]. Patient reports the account [phone] will require a $125 deposit and at the moment patient does not have the monies. CSW contacted [employer] who states patient will need to have cash, check or money order in order to have the water turned on. CSW unable to pay online to assist patient with payment. CSW will return call to patient to inform. Raquel Sarna, [place], [place]

## 2019-09-02 NOTE — Telephone Encounter (Signed)
CSW contacted patient to follow up on water bill. Patient's brother answered and states that patient fell this morning and is lying down. Brother states patient is fine just needed a rest. CSW asked if patient had been drinking alcohol and brother denies and states he is resting. CSW inquired about the status of the water bill and brother states they don't have the funds for the deposit. CSW encouraged brother to go to [facility] with lease and ID and open an account and CSW will pay the deposit online from the Patient care fund. Brother states he has a ride to [facility] and will return call to CSW once account is opened. CSW will await return call from patient/brother. Raquel Sarna, [place], [place] '

## 2019-09-02 NOTE — Telephone Encounter (Signed)
CSW returned call to patient to follow up on deposit. CSW will obtain a money order but will not be able to get to patient until end of the week. Patient's brother states as we speak that the water company arrived to turn on the water. Brother will confirm if deposit still needed and return call to CSW as needed. Patient/Brother appear to have water in the home at this point. CSW available as needed Raquel Sarna, [place] Mike, [place]

## 2019-09-03 ENCOUNTER — Telehealth (HOSPITAL_COMMUNITY): Payer: Self-pay | Admitting: Licensed Clinical Social Worker

## 2019-09-03 NOTE — Telephone Encounter (Signed)
CSW received call form patient and returned call. Patient asking about his Disability check and states he has been checking his card each day and the monies still have not yet loaded. CSW explained to patient that due to his move that his check was previously sent to old address and new check has been issued and will arrive within the next few days. Patient verbalizes understanding. Patient also confirmed water is on and states "I had a good shower this morning". CSW continues to be available as needed. [name], [place], [place]

## 2019-09-08 ENCOUNTER — Telehealth (HOSPITAL_COMMUNITY): Payer: Self-pay | Admitting: Licensed Clinical Social Worker

## 2019-09-08 NOTE — Telephone Encounter (Signed)
CSW informed that pt water has been cut back off because the deposit wasn't paid.  CSW called [employer] to confirm payment method and amount- CSW will take payment tomorrow morning.  CSW spoke with pt to inform that water should be back on tomorrow- Pt also confirmed that his disability check came as well has his new EBT card  CSW will continue to follow and assist as needed  [name], [facility] Desk#: [phone] Cell#: [phone]

## 2019-09-09 ENCOUNTER — Telehealth: Payer: Self-pay | Admitting: *Deleted

## 2019-09-09 ENCOUNTER — Telehealth: Payer: Self-pay | Admitting: Internal Medicine

## 2019-09-09 NOTE — Telephone Encounter (Signed)
ICD transmission received 09/09/19 at 04:04. No alerts. Presenting rhythm Vs @ 81bpm. No episodes (VT-1 monitor zone programmed at 170bpm). HR histograms appropriate.

## 2019-09-09 NOTE — Telephone Encounter (Signed)
Call returned to [employer] with transportation company.  [employer] requesting cost center information d/t this nurse arranged transportation for Pt's medical services.  Discussed with Freight forwarder.  Will forward for further needs.

## 2019-09-09 NOTE — Telephone Encounter (Signed)
Patient states, he had syncopal episode last night.  Patient states it happens when he stands up and has happened 2-3 times since discharged from hospital.  He has sustained an injury to his head causing bleeding.  ICD has not gone off.  Original call to inform patient a 5 day ZIO XT long term holter monitor to be mailed to patients home. Instructions reviewed briefly as they are included in the monitor kit.

## 2019-09-09 NOTE — Telephone Encounter (Signed)
New Message     [employer] from transportation is calling to speak with [name]     Please call back

## 2019-09-10 ENCOUNTER — Telehealth: Payer: Self-pay | Admitting: *Deleted

## 2019-09-12 NOTE — Telephone Encounter (Signed)
I spoke with [name] and clarified why this occurred. I provided the cost center for this patient's transportation.   [name] is going to email me some information to share with the staff on a new (and hopefully easier) way to coordinate patient transportation through him, our [facility].  No further action required.

## 2019-09-16 ENCOUNTER — Ambulatory Visit: Payer: Medicaid Other

## 2019-09-16 ENCOUNTER — Telehealth (HOSPITAL_COMMUNITY): Payer: Self-pay | Admitting: Licensed Clinical Social Worker

## 2019-09-16 NOTE — Telephone Encounter (Signed)
CSW called pt to check in.  Pt reports he is doing well and is waiting to go to his appt with [facility] to get his ICD wound check.  Pt thought transport was set up but acknowledges he never spoke with anyone about this.  CSW called [address] and confirmed there is no transport set up for pt.  CSW had them rescheduled pt for next Tuesday and CSW called [facility], which is now running to [place] again, and set up transportation for pt to get to appt- they confirmed they can take him.  CSW updated patient  CSW will continue to follow and assist as needed  [name], [facility] Desk#: [phone] Cell#: [phone]

## 2019-09-17 ENCOUNTER — Telehealth (HOSPITAL_COMMUNITY): Payer: Self-pay | Admitting: Licensed Clinical Social Worker

## 2019-09-17 NOTE — Telephone Encounter (Signed)
CSW called [facility] to ensure that ride has been scheduled for next Tuesday- they confirm pt is set for next Tuesday and CSW provided with pt updated contact information.  CSW also went ahead and set up transport for pt [facility].  CSW will continue to follow and assist as needed  [name], [facility] Desk#: [phone] Cell#: [phone]

## 2019-09-18 NOTE — Telephone Encounter (Signed)
Pt device check is normal.  Pt has orthostatic hypotension which was discussed at last office visit.  Pt was advised to increase salt/fluid.  No action needed.

## 2019-09-22 ENCOUNTER — Telehealth (HOSPITAL_COMMUNITY): Payer: Self-pay | Admitting: Licensed Clinical Social Worker

## 2019-09-22 NOTE — Telephone Encounter (Signed)
CSW called pt to make sure he remembered appt tomorrow for wound check following his ICD placement.  Pt confirmed he is aware of appt and had received call from [facility] confirming they were picking him up tomorrow morning.   No further needs at this time  [name], [facility] Desk#: [phone] Cell#: [phone]

## 2019-09-23 ENCOUNTER — Other Ambulatory Visit: Payer: Self-pay

## 2019-09-23 ENCOUNTER — Ambulatory Visit (INDEPENDENT_AMBULATORY_CARE_PROVIDER_SITE_OTHER): Payer: Medicaid Other

## 2019-09-23 ENCOUNTER — Encounter (INDEPENDENT_AMBULATORY_CARE_PROVIDER_SITE_OTHER): Payer: Self-pay

## 2019-09-23 ENCOUNTER — Ambulatory Visit (INDEPENDENT_AMBULATORY_CARE_PROVIDER_SITE_OTHER): Payer: Medicaid Other | Admitting: Student

## 2019-09-23 DIAGNOSIS — I493 Ventricular premature depolarization: Secondary | ICD-10-CM

## 2019-09-23 DIAGNOSIS — I5022 Chronic systolic (congestive) heart failure: Secondary | ICD-10-CM

## 2019-09-23 LAB — CUP PACEART INCLINIC DEVICE CHECK
Date Time Interrogation Session: 20200929040000
HighPow Impedance: 47 Ohm
HighPow Impedance: 63 Ohm
Implantable Lead Implant Date: 20200904
Implantable Lead Location: 753860
Implantable Lead Model: 293
Implantable Lead Serial Number: 443717
Implantable Pulse Generator Implant Date: 20200904
Lead Channel Impedance Value: 492 Ohm
Lead Channel Pacing Threshold Amplitude: 0.7 V
Lead Channel Pacing Threshold Pulse Width: 0.4 ms
Lead Channel Sensing Intrinsic Amplitude: 14.3 mV
Lead Channel Setting Pacing Amplitude: 3.5 V
Lead Channel Setting Pacing Pulse Width: 0.4 ms
Lead Channel Setting Sensing Sensitivity: 0.5 mV
Pulse Gen Serial Number: 271403

## 2019-09-23 NOTE — Progress Notes (Signed)
Wound check appointment. Steri-strips removed. Wound without redness or edema. Incision edges approximated, wound well healed. Normal device function. Thresholds, sensing, and impedances consistent with implant measurements. Device programmed at 3.5V for extra safety margin until 3 month visit. Histogram distribution appropriate for patient and level of activity. No mode switches or ventricular arrhythmias noted. (DFT on implant day noted on device). Patient educated about wound care, arm mobility, lifting restrictions, shock plan. ROV in 3 months with Dr. Lovena Le.   Pt previously had Zio patch ordered to quantify PVCs. This was applied in office today. Instructions reviewed with patients.  Reported syncope with no associated even on interrogation.   [name] Como [address] [place], PA-C  09/23/2019 12:40 PM

## 2019-09-29 ENCOUNTER — Telehealth (HOSPITAL_COMMUNITY): Payer: Self-pay | Admitting: Licensed Clinical Social Worker

## 2019-09-29 NOTE — Telephone Encounter (Signed)
CSW called pt to check in.  Pt reports doing well and not having any more fainting spells.  Pt still concerned about disability benefits thinks he is getting too little (only $640)- CSW to assist pt in calling social security tomorrow to inquire about benefits.  CSW will continue to follow and assist as needed  [name], [facility] Desk#: [phone] Cell#: [phone]

## 2019-09-30 ENCOUNTER — Telehealth (HOSPITAL_COMMUNITY): Payer: Self-pay | Admitting: Licensed Clinical Social Worker

## 2019-09-30 MED FILL — FUROSEMIDE 40 MG TAB: 40 | 30 days supply | Qty: 60 | Fill #2

## 2019-09-30 MED FILL — ENTRESTO 49 MG-51 MG TABLET: 49-51 | 30 days supply | Qty: 60 | Fill #1

## 2019-09-30 MED FILL — POTASSIUM CHLORIDE CRYS ER: 20 | 30 days supply | Qty: 30 | Fill #2

## 2019-09-30 MED FILL — ATORVASTATIN 80 MG TABLET: 80 | 30 days supply | Qty: 30 | Fill #1

## 2019-09-30 NOTE — Telephone Encounter (Signed)
CSW called pt to follow up regarding social security payment- Pt reports he got a notice today that his SSDI payments will increase to about $890- no longer need to call social security office together.  Pt also reports that he is out of medication- CSW called [employer] and reordered medications to be shipped out to patient.  CSW will continue to follow and assist as needed  [name], [facility] Desk#: [phone] Cell#: [phone]

## 2019-10-01 ENCOUNTER — Telehealth (HOSPITAL_COMMUNITY): Payer: Self-pay | Admitting: Licensed Clinical Social Worker

## 2019-10-01 NOTE — Telephone Encounter (Signed)
Pt called to speak with CSW. Pt having conflict with his brother and other roommates and wants to move somewhere alone.  CSW provided support and active listening.  Pt has called his local Housing Authority to request help finding alternate housing- awaiting return call.   Pt also reports he is feeling somewhat badly at this time (short of breath) but also states he has been very worked up today with everything going on at home- CSW encouraged pt to go to the ED if he continues to feel worse and reminded pt of Clinic appt on Tuesday so he can get fully checked out.  CSW will continue to follow and assist as needed  [name], [facility] Desk#: [phone] Cell#: [phone]

## 2019-10-06 ENCOUNTER — Telehealth (HOSPITAL_COMMUNITY): Payer: Self-pay | Admitting: Licensed Clinical Social Worker

## 2019-10-06 NOTE — Telephone Encounter (Signed)
Pt called CSW to ask about food stamp paperwork.  States he just got renewal application in the mail and needed help with it.  Pt coming in for appt tomorrow to clinic so requested he bring paperwork at that time for [place] to complete together- pt confirmed he is able to do this.  CSW will continue to follow and assist as needed  [name], [facility] Desk#: [phone] Cell#: [phone]

## 2019-10-07 ENCOUNTER — Encounter (HOSPITAL_COMMUNITY): Payer: Self-pay

## 2019-10-07 ENCOUNTER — Other Ambulatory Visit: Payer: Self-pay

## 2019-10-07 ENCOUNTER — Ambulatory Visit (HOSPITAL_COMMUNITY)
Admission: RE | Admit: 2019-10-07 | Discharge: 2019-10-07 | Disposition: A | Discharge status: Home / Self Care | Payer: Medicaid Other | Source: Ambulatory Visit | Attending: Internal Medicine | Admitting: Internal Medicine

## 2019-10-07 VITALS — BP 118/74 | HR 86 | Wt 156.4 lb

## 2019-10-07 DIAGNOSIS — M1990 Unspecified osteoarthritis, unspecified site: Secondary | ICD-10-CM | POA: Insufficient documentation

## 2019-10-07 DIAGNOSIS — Z9114 Patient's other noncompliance with medication regimen: Secondary | ICD-10-CM | POA: Diagnosis not present

## 2019-10-07 DIAGNOSIS — I110 Hypertensive heart disease with heart failure: Secondary | ICD-10-CM | POA: Insufficient documentation

## 2019-10-07 DIAGNOSIS — Z951 Presence of aortocoronary bypass graft: Secondary | ICD-10-CM | POA: Diagnosis not present

## 2019-10-07 DIAGNOSIS — Z8249 Family history of ischemic heart disease and other diseases of the circulatory system: Secondary | ICD-10-CM | POA: Diagnosis not present

## 2019-10-07 DIAGNOSIS — I2510 Atherosclerotic heart disease of native coronary artery without angina pectoris: Secondary | ICD-10-CM | POA: Insufficient documentation

## 2019-10-07 DIAGNOSIS — Z79899 Other long term (current) drug therapy: Secondary | ICD-10-CM | POA: Insufficient documentation

## 2019-10-07 DIAGNOSIS — Z8674 Personal history of sudden cardiac arrest: Secondary | ICD-10-CM | POA: Insufficient documentation

## 2019-10-07 DIAGNOSIS — Z9119 Patient's noncompliance with other medical treatment and regimen: Secondary | ICD-10-CM | POA: Insufficient documentation

## 2019-10-07 DIAGNOSIS — I5022 Chronic systolic (congestive) heart failure: Secondary | ICD-10-CM | POA: Diagnosis not present

## 2019-10-07 DIAGNOSIS — E7800 Pure hypercholesterolemia, unspecified: Secondary | ICD-10-CM | POA: Diagnosis not present

## 2019-10-07 DIAGNOSIS — I255 Ischemic cardiomyopathy: Secondary | ICD-10-CM | POA: Insufficient documentation

## 2019-10-07 DIAGNOSIS — Z95810 Presence of automatic (implantable) cardiac defibrillator: Secondary | ICD-10-CM | POA: Insufficient documentation

## 2019-10-07 LAB — BASIC METABOLIC PANEL
Anion gap: 13 (ref 5–15)
BUN: 13 mg/dL (ref 6–20)
CO2: 19 mmol/L — ABNORMAL LOW (ref 22–32)
Calcium: 8.9 mg/dL (ref 8.9–10.3)
Chloride: 103 mmol/L (ref 98–111)
Creatinine, Ser: 1.26 mg/dL — ABNORMAL HIGH (ref 0.61–1.24)
GFR calc Af Amer: >60 mL/min (ref >60)
GFR calc non Af Amer: >60 mL/min (ref >60)
Glucose, Bld: 94 mg/dL (ref 70–99)
Potassium: 4.5 mmol/L (ref 3.5–5.1)
Sodium: 135 mmol/L (ref 135–145)

## 2019-10-07 NOTE — Progress Notes (Signed)
CSW met with pt to check in and help with food stamp application.  CSW and pt went through food stamp renewal application and completed- CSW faxed to [facility]- fax confirmation receivedl- provided copy to patient for his records.  Original form also mailed to [facility] just in case.  Pt also reported during appt that he lost his scale- new scale provided so he can better monitor weights.  CSW will continue to follow and assist as needed  [name], [facility] Desk#: [phone] Cell#: [phone]

## 2019-10-07 NOTE — Progress Notes (Signed)
PCP: None  Primary Cardiologist: Dr [name]   HPI: [name] [facility] 58 y.[facility].malewith h/o ETOH, VF Arrest, CAD s/p CABG 12/07/17, and ICM with EF 25-30%.  Admitted 12/6 - 12/14/17 with V-Fib arrest while chopping wood. Received CPR in the field. R/LHC with severe 3vD as below and cardiogenic shock requiring IABP. Required milrinone with continued shock. Pt improved and IABP removed 12/10, but pt had recurrent VF arrest so IABP replaced. Stabilized and underwent CABG 12/07/17. Tolerated gradual wean of meds and extubation with no furthe events. HF medications titrated as tolerated.  Admitted 12/27 ->12/28 with syncope and AKI thought to be 2/2 volume depletion on lasix, spiro, and Entresto. CT negative for ICH. Improved rapidly with holding meds and gentle IVF. Entresto stopped. Spiro cut back and switched to low dose losartan.  He had repeat echo 04/2019 that showed persistently reduced LVEF at 25%.    Last clinic visit w/ Dr. [name] on 7/22, losartan was discontinued and pt placed back on low dose Entresto (has been tolerating well w/o recurrent syncope/ near syncope). Given EF < 35% despite medical therapy, he was referred to EP and seen by Dr. [name] who recommended ICD implantation.    He was admitted 08/29/19 for implantation of a [employer] single chamber ICD.   Today in f/u, he reports that he feels ok. He does not have his own transportation so he walks many places and does get SOB w/ moderate amount of walking and has to stop to rest. No dyspnea w/ ADLs. He has no LEE but volume appears to be up based on ReDs measurement of 43%.  He denies any exertional CP. No syncope/ near syncope. BP is stable. Device pocket is well healed. No ICD shocks. Unfortunately, he continues to drink ETOH "a couple of beers daily"    ROS: All systems negative except as listed in HPI, PMH and Problem List.  SH:  Social History   Socioeconomic History  . Marital status: Divorced   Spouse name: Not on file  . Number of children: Not on file  . Years of education: Not on file  . Highest education level: Not on file  Occupational History  . Not on file  Social Needs  . Financial resource strain: Not on file  . Food insecurity    Worry: Not on file    Inability: Not on file  . Transportation needs    Medical: Not on file    Non-medical: Not on file  Tobacco Use  . Smoking status: Never Smoker  . Smokeless tobacco: Never Used  Substance and Sexual Activity  . Alcohol use: Yes    Comment: 12/20/2017 "I quit drinking after I had the heart attack"  . Drug use: No  . Sexual activity: Not Currently  Lifestyle  . Physical activity    Days per week: Not on file    Minutes per session: Not on file  . Stress: Not on file  Relationships  . Social Musicianconnections    Talks on phone: Not on file    Gets together: Not on file    Attends religious service: Not on file    Active member of club or organization: Not on file    Attends meetings of clubs or organizations: Not on file    Relationship status: Not on file  . Intimate partner violence    Fear of current or ex partner: Not on file    Emotionally abused: Not on file    Physically abused:  Not on file    Forced sexual activity: Not on file  Other Topics Concern  . Not on file  Social History Narrative  . Not on file    FH:  Family History  Problem Relation Age of Onset  . Heart attack Other     Past Medical History:  Diagnosis Date  . Arthritis    "hands; maybe my toes" (12/20/2017)  . CAD (coronary artery disease)   . Cardiac arrest (HCC) 11/29/2017   hx VF arrest/notes 12/20/2017  . Coronary artery disease   . GERD (gastroesophageal reflux disease)   . H/O ETOH abuse    /notes 12/20/2017  . High cholesterol   . Hypertension   . Ischemic cardiomyopathy    a. 08/2019 s/p BSX D232 single lead AICD.  Marland Kitchen Seizures (HCC) ~ 2016; ~ 2017   "I've had a couple; I was at work" (12/20/2017)  . Syncope and  collapse    "last few days" (12/20/2017)    Current Outpatient Medications  Medication Sig Dispense Refill  . ASPIRIN ADULT LOW STRENGTH 81 MG EC tablet Take 1 tablet (81 mg total) by mouth daily. 90 tablet 1  . atorvastatin (LIPITOR) 80 MG tablet Take 1 tablet (80 mg total) by mouth daily at 6 PM. 90 tablet 1  . carvedilol (COREG) 6.25 MG tablet Take 1 tablet (6.25 mg total) by mouth 2 (two) times daily with a meal. 180 tablet 3  . furosemide (LASIX) 40 MG tablet Take 1 tablet (40 mg total) by mouth daily as needed. 30 tablet   . furosemide (LASIX) 80 MG tablet Take 1 tablet (80 mg total) by mouth daily. May take additional 40 mg as needed. 90 tablet 1  . potassium chloride SA (K-DUR) 20 MEQ tablet Take 1 tablet (20 mEq total) by mouth daily. Take 1 tab when you take a dose of lasix as needed (Patient taking differently: Take 20 mEq by mouth every evening. Take 1 tab when you take a dose of lasix as needed) 30 tablet 6  . sacubitril-valsartan (ENTRESTO) 49-51 MG Take 1 tablet by mouth 2 (two) times daily. 60 tablet 3   No current facility-administered medications for this encounter.     Vitals:   10/07/19 0936  BP: 118/74  Pulse: 86  SpO2: 97%  Weight: 70.9 kg (156 lb 6.4 oz)   Wt Readings from Last 3 Encounters:  10/07/19 70.9 kg (156 lb 6.4 oz)  08/30/19 67.9 kg (149 lb 11.2 oz)  08/25/19 71.3 kg (157 lb 3.2 oz)       PHYSICAL EXAM: PHYSICAL EXAM: General:  Well appearing. No respiratory difficulty HEENT: normal Neck: supple. JVD. Carotids 2+ bilat; no bruits. No lymphadenopathy or thyromegaly appreciated. Cor: PMI nondisplaced. Regular rate & rhythm. No rubs, gallops or murmurs. Lungs: faint crackles at the bases bilaterally  Abdomen: soft, nontender, nondistended. No hepatosplenomegaly. No bruits or masses. Good bowel sounds. Extremities: no cyanosis, clubbing, rash, edema Neuro: alert & oriented x 3, cranial nerves grossly intact. moves all 4 extremities w/o  difficulty. Affect pleasant.   ASSESSMENT & PLAN: 1. Chronic systolic CHF with ICM - TTE 11/30/17 25-30% RV normal. Echo 03/2018: EF40-45%RV normal.  - ECHO 5/20 EF ~ 25%.  - NYHA II-III  - Volume status elevated. ReDS 43%  - Increase lasix to 40 mg bid x 3 days, then return to 40 mg daily.  - Check BMP today.  - Will continue current dose of Entresto 49/51(previously did not tolerate higher dose) -  Continue to hold off on spiro given h/o noncompliance.  -Continue coreg to 6.25 mg bid - Not eligible for paramedicine because he is outside of [facility]. -s/p ICD. Followed by Dr. Lovena Le   2. CAD s/p CABG 12/07/2017 - denies exertional CP -ContinueASAandatorvastatin. -no recent FLP on file. Not fasting today. Will plan FLP on return visit.   3. VF arrest in setting of STEMI -Stable. - now has ICD. No shocks.   4. ETOH abuse - We discussed that he needs to avoid alcohol all together.   5. H/O Noncompliance - he reports he is taking medications but does not seem reliable. - eligible for paramedicine because he is outside of [place] meet w/ pt today and will provide pill box to help better organize meds.    F/u in 2 weeks w/ APP    Brittainy Simmons PA-C 9:55 AM

## 2019-10-07 NOTE — Progress Notes (Signed)
ReDS Vest - 10/07/19 1000      ReDS Vest   Estimated volume prior to reading  Med    Fitting Posture  Sitting    Height Marker  Tall    Ruler Value  30    Center Strip  Aligned    ReDS Value  42    Anatomical Comments  station C

## 2019-10-07 NOTE — Patient Instructions (Signed)
Lab work done today. We will notify you of any abnormal lab work. No news is good news!  PLEASE take an extra 1 tab (40mg ) of Furosemide today when you return home.  PLEASE take Furosemide 40mg  (1 tab) two times daily FOR TWO DAYS ONLY. THEN resume your 40mg  (1 tab) daily.  Please follow up with the [facility] in 2 weeks.  At the [facility], you and your health needs are our priority. As part of our continuing mission to provide you with exceptional heart care, we have created designated Provider Care Teams. These Care Teams include your primary Cardiologist (physician) and Advanced Practice Providers (APPs- Physician Assistants and Nurse Practitioners) who all work together to provide you with the care you need, when you need it.   You may see any of the following providers on your designated Care Team at your next follow up: Marland Kitchen Dr Glori Bickers . Dr Loralie Champagne . Darrick Grinder, NP . Lyda Jester, PA   Please be sure to bring in all your medications bottles to every appointment.

## 2019-10-13 ENCOUNTER — Telehealth (HOSPITAL_COMMUNITY): Payer: Self-pay | Admitting: Licensed Clinical Social Worker

## 2019-10-13 MED ORDER — FUROSEMIDE 40 MG PO TABS: 40.0000 mg | ORAL_TABLET | Freq: Every day | ORAL | Status: DC

## 2019-10-13 NOTE — Telephone Encounter (Signed)
Per Amy NP-C pt is to continue 40mg  furosemide daily until his follow up appointment. Will reattempt to reach out to pt.

## 2019-10-13 NOTE — Addendum Note (Signed)
Addended by: Lindley Magnus A on: 10/13/2019 04:03 PM   Modules accepted: Orders

## 2019-10-13 NOTE — Telephone Encounter (Signed)
CSW received call from pt who was just wanting to talk.  Pt reports he was arrested on Friday and not released until Sunday due to failure to show for a court appt.  Pt states during that time he was not given his medications.  CSW called RN to inform that pt had been without medications over past couple of days to see if he needed to make any adjustments over the next few days to compensate- she will speak with provider and follow up with patient.  CSW also called [facility] to make trip reservation for appt next week- trip confirmed.  CSW will continue to follow and assist as needed  [name], [facility] Desk#: [phone] Cell#: [phone]

## 2019-10-13 NOTE — Telephone Encounter (Signed)
Pt should have already taken special dosing of lasix from last week and returned to his normal 40mg  daily. Should he do anything different since he was unable to take meds over the weekend? Unable to contact pt to do further assessment.

## 2019-10-20 ENCOUNTER — Telehealth (HOSPITAL_COMMUNITY): Payer: Self-pay | Admitting: Licensed Clinical Social Worker

## 2019-10-20 NOTE — Progress Notes (Signed)
PCP: None  Primary Cardiologist: Dr [name]   [name] 58 [facility].malewith h/o ETOH, VF Arrest, CAD s/p CABG 12/07/17, and ICM with EF 25-30%.  Admitted 12/6 - 12/14/17 with V-Fib arrest while chopping wood. Received CPR in the field. R/LHC with severe 3vD as below and cardiogenic shock requiring IABP. Required milrinone with continued shock. Pt improved and IABP removed 12/10, but pt had recurrent VF arrest so IABP replaced. Stabilized and underwent CABG 12/07/17. Tolerated gradual wean of meds and extubation with no furthe events. HF medications titrated as tolerated.  Admitted 12/27 ->12/28 with syncope and AKI thought to be 2/2 volume depletion on lasix, spiro, and Entresto. CT negative for ICH. Improved rapidly with holding meds and gentle IVF. Entresto stopped. Spiro cut back and switched to low dose losartan.  He had repeat echo 04/2019 that showed persistently reduced LVEF at 25%.    Last clinic visit w/ Dr. [name] on 7/22, losartan was discontinued and pt placed back on low dose Entresto (has been tolerating well w/o recurrent syncope/ near syncope). Given EF < 35% despite medical therapy, he was referred to EP and seen by Dr. [name] who recommended ICD implantation.    He was admitted 08/29/19 for implantation of a [employer] single chamber ICD.   At last OV, he was noted to be volume overloaded on exam and ReDs measurement was elevated at 43%.  He was instructed to increase lasix to 40 mg bid x 3 days, then return to 40 mg daily.   He reports back to clinic today for f/u. Reported not feeling well when he arrived. Had emesis this am prior to arrival. Volume improved. [employer] HearLogic score is good at 5. No peripheral edema. Wt down 3 lb from 156>>153. BP elevated today at 140/79. He took his AM meds today. Complains of CP. Same type of CP he notes at every visit. Left upper chest near device pocket. Notes reproducible pain/ tenderness w/  palpation. Not worsen by exertion.   EKG was ordered. While EKG tech was performing EKG, pt had a seizure. When I returned to exam room he was convulsing. No palpable pulse. CODE was called and chest compressions started w/ quick return of pulse. Zoll monitor placed and showed sinus tach. Respirations shallow and pt bagged. Dr. Shirlee LatchMcLean was present during CODE. Pt stabilized and transported to the ED for further w/u and treatment.     ROS: All systems negative except as listed in HPI, PMH and Problem List.  SH:  Social History   Socioeconomic History  . Marital status: Divorced    Spouse name: Not on file  . Number of children: Not on file  . Years of education: Not on file  . Highest education level: Not on file  Occupational History  . Not on file  Social Needs  . Financial resource strain: Not on file  . Food insecurity    Worry: Not on file    Inability: Not on file  . Transportation needs    Medical: Not on file    Non-medical: Not on file  Tobacco Use  . Smoking status: Never Smoker  . Smokeless tobacco: Never Used  Substance and Sexual Activity  . Alcohol use: Yes    Comment: 12/20/2017 "I quit drinking after I had the heart attack"  . Drug use: No  . Sexual activity: Not Currently  Lifestyle  . Physical activity    Days per week: Not on file    Minutes  per session: Not on file  . Stress: Not on file  Relationships  . Social Herbalist on phone: Not on file    Gets together: Not on file    Attends religious service: Not on file    Active member of club or organization: Not on file    Attends meetings of clubs or organizations: Not on file    Relationship status: Not on file  . Intimate partner violence    Fear of current or ex partner: Not on file    Emotionally abused: Not on file    Physically abused: Not on file    Forced sexual activity: Not on file  Other Topics Concern  . Not on file  Social History Narrative  . Not on file    FH:   Family History  Problem Relation Age of Onset  . Heart attack Other     Past Medical History:  Diagnosis Date  . Arthritis    "hands; maybe my toes" (12/20/2017)  . CAD (coronary artery disease)   . Cardiac arrest ([place]) 11/29/2017   hx VF arrest/notes 12/20/2017  . Coronary artery disease   . GERD (gastroesophageal reflux disease)   . H/O ETOH abuse    /notes 12/20/2017  . High cholesterol   . Hypertension   . Ischemic cardiomyopathy    a. 08/2019 s/p BSX D232 single lead AICD.  Marland Kitchen Seizures ([place]) ~ 2016; ~ 2017   "I've had a couple; I was at work" (12/20/2017)  . Syncope and collapse    "last few days" (12/20/2017)    Current Outpatient Medications  Medication Sig Dispense Refill  . ASPIRIN ADULT LOW STRENGTH 81 MG EC tablet Take 1 tablet (81 mg total) by mouth daily. 90 tablet 1  . atorvastatin (LIPITOR) 80 MG tablet Take 1 tablet (80 mg total) by mouth daily at 6 PM. 90 tablet 1  . carvedilol (COREG) 6.25 MG tablet Take 1 tablet (6.25 mg total) by mouth 2 (two) times daily with a meal. 180 tablet 3  . furosemide (LASIX) 40 MG tablet Take 1 tablet (40 mg total) by mouth daily. May take extra 40mg  daily as needed for swelling    . potassium chloride SA (K-DUR) 20 MEQ tablet Take 1 tablet (20 mEq total) by mouth daily. Take 1 tab when you take a dose of lasix as needed 30 tablet 6  . sacubitril-valsartan (ENTRESTO) 49-51 MG Take 1 tablet by mouth 2 (two) times daily. 60 tablet 3   No current facility-administered medications for this encounter.     Vitals:   10/21/19 0947  BP: 140/79  Pulse: 96  SpO2: 97%  Weight: 69.7 kg (153 lb 9.6 oz)   Wt Readings from Last 3 Encounters:  10/21/19 69.7 kg (153 lb 9.6 oz)  10/07/19 70.9 kg (156 lb 6.4 oz)  08/30/19 67.9 kg (149 lb 11.2 oz)      PHYSICAL EXAM: PHYSICAL EXAM Pre Seizure: General:  Ill appearing/looks fatigued. No respiratory difficulty HEENT: normal Neck: supple. no JVD. Carotids 2+ bilat; no bruits. No  lymphadenopathy or thyromegaly appreciated. Cor: PMI nondisplaced. Regular rate & rhythm. No rubs, gallops or murmurs. Lungs: clear Abdomen: soft, nontender, nondistended. No hepatosplenomegaly. No bruits or masses. Good bowel sounds. Extremities: no cyanosis, clubbing, rash, edema Neuro: alert & oriented x 3, cranial nerves grossly intact. moves all 4 extremities w/o difficulty. Affect pleasant.    ASSESSMENT & PLAN:  1. Seizure/CODE: while in exam room getting EKG, pt  had a seizure.  When I entered exam room he was convulsing. No palpable pulse. CODE was called and chest compressions started w/ quick return of pulse. Zoll monitor placed and showed sinus tach. Respirations shallow and pt bagged. Dr. Shirlee Latch was present during CODE. Pt stabilized and transported to the ED for further w/u and treatment.   2. Chronic systolic CHF with ICM - TTE 11/30/17 25-30% RV normal. Echo 03/2018: EF40-45%RV normal.  - ECHO 5/20 EF ~ 25%.  - NYHA II - Volume status improved after temporary diuretic dose adustment. Heart Logic score 5 - Plan to continue 40 mg lasix daily.  - BMP will be obtained in the ED.  - Continue current dose of Entresto 49/51(previously did not tolerate higher dose) - Continue to hold off on spiro given h/o noncompliance.  -Continue coreg to 6.25 mg bid - Not eligible for paramedicine because he is outside of [facility]. -s/p ICD. Followed by Dr. Ladona Ridgel. Plan device interrogation.   3. CAD s/p CABG 12/07/2017 - denies exertional CP but has chronic atypical CP. EKG to be obtained on admit.  -ContinueASAandatorvastatin.   4. H/o VF arrest in setting of STEMI - now has ICD. Device rep to interrogate  5. ETOH abuse - We discussed that he needs to avoid alcohol all together. -suspect seizure provoked by possible withdrawal.    6. H/O Noncompliance - unfortunately eligible for paramedicine because he is outside of [facility] pt to ED for  further w/u for seizure. Follow up post discharge.    Jose Eisner PA-C 10:23 AM

## 2019-10-20 NOTE — Telephone Encounter (Signed)
CSW called to confirm he is aware of appt tomorrow- pt is aware and states he has already heard from [facility] who is going to be providing transport.  CSW will continue to follow and assist as needed  [name], [facility] Desk#: [phone] Cell#: [phone]

## 2019-10-21 ENCOUNTER — Inpatient Hospital Stay (HOSPITAL_COMMUNITY)
Admission: EM | Admit: 2019-10-21 | Discharge: 2019-10-23 | DRG: 100 | Disposition: A | Discharge status: Home / Self Care | Payer: Medicaid Other | Attending: Internal Medicine | Admitting: Internal Medicine

## 2019-10-21 ENCOUNTER — Encounter (HOSPITAL_COMMUNITY): Payer: Self-pay

## 2019-10-21 ENCOUNTER — Other Ambulatory Visit: Payer: Self-pay

## 2019-10-21 ENCOUNTER — Ambulatory Visit (HOSPITAL_COMMUNITY)
Admission: RE | Admit: 2019-10-21 | Discharge: 2019-10-21 | Disposition: A | Discharge status: Home / Self Care | Payer: Medicaid Other | Source: Ambulatory Visit | Attending: Adult Health | Admitting: Adult Health

## 2019-10-21 ENCOUNTER — Inpatient Hospital Stay (HOSPITAL_COMMUNITY): Payer: Medicaid Other

## 2019-10-21 ENCOUNTER — Emergency Department (HOSPITAL_COMMUNITY): Payer: Medicaid Other

## 2019-10-21 VITALS — BP 140/79 | HR 96 | Wt 153.6 lb

## 2019-10-21 DIAGNOSIS — Z9114 Patient's other noncompliance with medication regimen: Secondary | ICD-10-CM | POA: Insufficient documentation

## 2019-10-21 DIAGNOSIS — Z79899 Other long term (current) drug therapy: Secondary | ICD-10-CM

## 2019-10-21 DIAGNOSIS — I469 Cardiac arrest, cause unspecified: Secondary | ICD-10-CM | POA: Diagnosis not present

## 2019-10-21 DIAGNOSIS — I5022 Chronic systolic (congestive) heart failure: Secondary | ICD-10-CM

## 2019-10-21 DIAGNOSIS — Z8674 Personal history of sudden cardiac arrest: Secondary | ICD-10-CM | POA: Insufficient documentation

## 2019-10-21 DIAGNOSIS — Z9119 Patient's noncompliance with other medical treatment and regimen: Secondary | ICD-10-CM

## 2019-10-21 DIAGNOSIS — Z951 Presence of aortocoronary bypass graft: Secondary | ICD-10-CM

## 2019-10-21 DIAGNOSIS — G40909 Epilepsy, unspecified, not intractable, without status epilepticus: Secondary | ICD-10-CM

## 2019-10-21 DIAGNOSIS — I462 Cardiac arrest due to underlying cardiac condition: Secondary | ICD-10-CM | POA: Diagnosis present

## 2019-10-21 DIAGNOSIS — I4901 Ventricular fibrillation: Secondary | ICD-10-CM | POA: Diagnosis present

## 2019-10-21 DIAGNOSIS — M1990 Unspecified osteoarthritis, unspecified site: Secondary | ICD-10-CM | POA: Insufficient documentation

## 2019-10-21 DIAGNOSIS — R569 Unspecified convulsions: Secondary | ICD-10-CM | POA: Insufficient documentation

## 2019-10-21 DIAGNOSIS — R1110 Vomiting, unspecified: Secondary | ICD-10-CM | POA: Insufficient documentation

## 2019-10-21 DIAGNOSIS — I255 Ischemic cardiomyopathy: Secondary | ICD-10-CM | POA: Insufficient documentation

## 2019-10-21 DIAGNOSIS — Z95810 Presence of automatic (implantable) cardiac defibrillator: Secondary | ICD-10-CM

## 2019-10-21 DIAGNOSIS — Z03818 Encounter for observation for suspected exposure to other biological agents ruled out: Secondary | ICD-10-CM | POA: Diagnosis not present

## 2019-10-21 DIAGNOSIS — E7800 Pure hypercholesterolemia, unspecified: Secondary | ICD-10-CM | POA: Insufficient documentation

## 2019-10-21 DIAGNOSIS — F1010 Alcohol abuse, uncomplicated: Secondary | ICD-10-CM | POA: Diagnosis present

## 2019-10-21 DIAGNOSIS — R000 Tachycardia, unspecified: Secondary | ICD-10-CM | POA: Insufficient documentation

## 2019-10-21 DIAGNOSIS — Z20828 Contact with and (suspected) exposure to other viral communicable diseases: Secondary | ICD-10-CM | POA: Diagnosis present

## 2019-10-21 DIAGNOSIS — I5023 Acute on chronic systolic (congestive) heart failure: Secondary | ICD-10-CM | POA: Diagnosis present

## 2019-10-21 DIAGNOSIS — R079 Chest pain, unspecified: Secondary | ICD-10-CM | POA: Insufficient documentation

## 2019-10-21 DIAGNOSIS — I2510 Atherosclerotic heart disease of native coronary artery without angina pectoris: Secondary | ICD-10-CM | POA: Insufficient documentation

## 2019-10-21 DIAGNOSIS — I252 Old myocardial infarction: Secondary | ICD-10-CM

## 2019-10-21 DIAGNOSIS — Z7982 Long term (current) use of aspirin: Secondary | ICD-10-CM

## 2019-10-21 DIAGNOSIS — I110 Hypertensive heart disease with heart failure: Secondary | ICD-10-CM | POA: Insufficient documentation

## 2019-10-21 HISTORY — DX: Presence of automatic (implantable) cardiac defibrillator: Z95.810

## 2019-10-21 HISTORY — DX: Unspecified convulsions: R56.9

## 2019-10-21 LAB — BASIC METABOLIC PANEL
Anion gap: 21 — ABNORMAL HIGH (ref 5–15)
BUN: 13 mg/dL (ref 6–20)
CO2: 15 mmol/L — ABNORMAL LOW (ref 22–32)
Calcium: 9.3 mg/dL (ref 8.9–10.3)
Chloride: 100 mmol/L (ref 98–111)
Creatinine, Ser: 1.48 mg/dL — ABNORMAL HIGH (ref 0.61–1.24)
GFR calc Af Amer: >60 mL/min (ref >60)
GFR calc non Af Amer: 52 mL/min — ABNORMAL LOW (ref >60)
Glucose, Bld: 189 mg/dL — ABNORMAL HIGH (ref 70–99)
Potassium: 4.8 mmol/L (ref 3.5–5.1)
Sodium: 136 mmol/L (ref 135–145)

## 2019-10-21 LAB — CBC
HCT: 43.1 % (ref 39.0–52.0)
HCT: 39 % (ref 39.0–52.0)
Hemoglobin: 14.1 g/dL (ref 13.0–17.0)
Hemoglobin: 13.2 g/dL (ref 13.0–17.0)
MCH: 34 pg (ref 26.0–34.0)
MCH: 33.7 pg (ref 26.0–34.0)
MCHC: 32.7 g/dL (ref 30.0–36.0)
MCHC: 33.8 g/dL (ref 30.0–36.0)
MCV: 103.9 fL — ABNORMAL HIGH (ref 80.0–100.0)
MCV: 99.5 fL (ref 80.0–100.0)
Platelets: 184 10*3/uL (ref 150–400)
Platelets: 144 10*3/uL — ABNORMAL LOW (ref 150–400)
RBC: 4.15 MIL/uL — ABNORMAL LOW (ref 4.22–5.81)
RBC: 3.92 MIL/uL — ABNORMAL LOW (ref 4.22–5.81)
RDW: 12.3 % (ref 11.5–15.5)
RDW: 12.4 % (ref 11.5–15.5)
WBC: 8.7 10*3/uL (ref 4.0–10.5)
WBC: 6.1 10*3/uL (ref 4.0–10.5)
nRBC: 0 % (ref 0.0–0.2)
nRBC: 0 % (ref 0.0–0.2)

## 2019-10-21 LAB — TROPONIN I (HIGH SENSITIVITY)
Troponin I (High Sensitivity): 7 ng/L (ref <18)
Troponin I (High Sensitivity): 10 ng/L (ref <18)

## 2019-10-21 LAB — ETHANOL: Alcohol, Ethyl (B): <10 mg/dL (ref <10)

## 2019-10-21 LAB — HIV ANTIBODY (ROUTINE TESTING W REFLEX): HIV Screen 4th Generation wRfx: NONREACTIVE

## 2019-10-21 LAB — CREATININE, SERUM
Creatinine, Ser: 1.32 mg/dL — ABNORMAL HIGH (ref 0.61–1.24)
GFR calc Af Amer: >60 mL/min (ref >60)
GFR calc non Af Amer: 60 mL/min — ABNORMAL LOW (ref >60)

## 2019-10-21 LAB — CBG MONITORING, ED: Glucose-Capillary: 171 mg/dL — ABNORMAL HIGH (ref 70–99)

## 2019-10-21 LAB — SARS CORONAVIRUS 2 BY RT PCR (HOSPITAL ORDER, PERFORMED IN ~~LOC~~ HOSPITAL LAB): SARS Coronavirus 2: NEGATIVE

## 2019-10-21 MED ORDER — ENTRESTO 97-103 MG PO TABS: 1.0000 | ORAL_TABLET | Freq: Two times a day (BID) | ORAL | 11 refills | Status: DC

## 2019-10-21 MED ORDER — ATORVASTATIN CALCIUM 80 MG PO TABS
80.0000 mg | ORAL_TABLET | Freq: Every day | ORAL | Status: DC
  Administered 2019-10-21 – 2019-10-22 (×2): 80 mg via ORAL
  Filled 2019-10-21 (×2): qty 1

## 2019-10-21 MED ORDER — SODIUM CHLORIDE 0.9% FLUSH: 3.0000 mL | INTRAVENOUS | Status: DC | PRN

## 2019-10-21 MED ORDER — LORAZEPAM 2 MG/ML IJ SOLN
INTRAMUSCULAR | Status: AC | PRN
  Administered 2019-10-21: 1 mg via INTRAVENOUS

## 2019-10-21 MED ORDER — ONDANSETRON HCL 4 MG/2ML IJ SOLN: 4.0000 mg | Freq: Four times a day (QID) | INTRAMUSCULAR | Status: DC | PRN

## 2019-10-21 MED ORDER — ENOXAPARIN SODIUM 40 MG/0.4ML ~~LOC~~ SOLN
40.0000 mg | SUBCUTANEOUS | Status: DC
  Administered 2019-10-22: 40 mg via SUBCUTANEOUS

## 2019-10-21 MED ORDER — ACETAMINOPHEN 325 MG PO TABS: 650.0000 mg | ORAL_TABLET | ORAL | Status: DC | PRN

## 2019-10-21 MED ORDER — LEVETIRACETAM IN NACL 1500 MG/100ML IV SOLN
1500.0000 mg | INTRAVENOUS | Status: AC
  Administered 2019-10-21: 1500 mg via INTRAVENOUS
  Filled 2019-10-21: qty 100

## 2019-10-21 MED ORDER — SODIUM CHLORIDE 0.9% FLUSH
3.0000 mL | Freq: Two times a day (BID) | INTRAVENOUS | Status: DC
  Administered 2019-10-21 – 2019-10-23 (×5): 3 mL via INTRAVENOUS

## 2019-10-21 MED ORDER — SODIUM CHLORIDE 0.9 % IV SOLN: 250.0000 mL | INTRAVENOUS | Status: DC | PRN

## 2019-10-21 MED ORDER — LORAZEPAM 2 MG/ML IJ SOLN
INTRAMUSCULAR | Status: AC
  Filled 2019-10-21: qty 1

## 2019-10-21 MED ORDER — LORAZEPAM 2 MG/ML IJ SOLN
2.0000 mg | INTRAMUSCULAR | Status: AC
  Administered 2019-10-21: 1 mg via INTRAVENOUS

## 2019-10-21 NOTE — H&P (Addendum)
Advanced Heart Failure Team History and Physical Note   PCP:  Patient, No Pcp Per  PCP-Cardiology: No primary care provider on file.     Reason for Admission: New Onset Seizure with Arrest   HPI:    [name] [facility] a 58 y.o.malewith h/o ETOH, VF Arrest, CAD s/p CABG 12/07/17, and ICM with EF 25-30%.  Admitted 12/6 - 12/14/17 with V-Fib arrest while chopping wood. Received CPR in the field. R/LHC with severe 3vD as below and cardiogenic shock requiring IABP. Required milrinone with continued shock. Pt improved and IABP removed 12/10, but pt had recurrent VF arrest so IABP replaced. Stabilized and underwent CABG 12/07/17. Tolerated gradual wean of meds and extubation with no furthe events. HF medications titrated as tolerated.  Admitted 12/27 ->12/28 with syncope and AKI thought to be 2/2 volume depletion on lasix, spiro, and Entresto. CT negative for ICH. Improved rapidly with holding meds and gentle IVF. Entresto stopped. Spiro cut back and switched to low dose losartan.  After HF meds optimized, ECHO repeated and showed persistently low EF. Referred to EP for ICD. On 08/29/19 he underwent  Single chamber ICD with [employer].  Over the last 24 hours he has been nauseated and had vomiting. Drinking 2-6 beers a day. He presented to the HF clinic today. He was able to eat breakfast. He felt ok walking in the clinic. He was sitting in the clinic room and had a witnessed seizure with loss of pulse. CPR started with quick ROSC. He did not require shock. Monitor showed SR. Moved to the ED.   He was taken to the ED. He has no idea what happened. He denies previous seizure.Denies recent falls.  He was alert and oriented. Denies chest pain.   Review of Systems: [y] = yes,  = no   General: Weight gain ; Weight loss ; Anorexia ; Fatigue [Y ]; Fever ; Chills ; Weakness   Cardiac: Chest pain/pressure ; Resting SOB ; Exertional SOB [ Y]; Orthopnea ;  Pedal Edema ; Palpitations ; Syncope ; Presyncope ; Paroxysmal nocturnal dyspnea[ ]   Pulmonary: Cough ; Wheezing[ ] ; Hemoptysis[ ] ; Sputum ; Snoring   GI: Vomiting[ ] ; Dysphagia[ ] ; Melena[ ] ; Hematochezia ; Heartburn[ ] ; Abdominal pain ; Constipation ; Diarrhea ; BRBPR   GU: Hematuria[ ] ; Dysuria ; Nocturia[ ]   Vascular: Pain in legs with walking ; Pain in feet with lying flat ; Non-healing sores ; Stroke ; TIA ; Slurred speech ;  Neuro: Headaches[ ] ; Vertigo[ ] ; Seizures[] ; Paresthesias[ ] ;Blurred vision ; Diplopia ; Vision changes   Ortho/Skin: Arthritis ; Joint pain [Y ]; Muscle pain ; Joint swelling ; Back Pain [Y ]; Rash   Psych: Depression[ ] ; Anxiety[ ]   Heme: Bleeding problems ; Clotting disorders ; Anemia   Endocrine: Diabetes ; Thyroid dysfunction[ ]    Home Medications Prior to Admission medications   Medication Sig Start Date End Date Taking? Authorizing Provider  ASPIRIN ADULT LOW STRENGTH 81 MG EC tablet Take 1 tablet (81 mg total) by mouth daily. 04/08/19   Alford Highland, NP  atorvastatin (LIPITOR) 80 MG tablet Take 1 tablet (80 mg total) by mouth daily at 6 PM. 04/08/19   Alford Highland, NP  carvedilol (COREG) 6.25 MG tablet Take  1 tablet (6.25 mg total) by mouth 2 (two) times daily with a meal. 06/09/19   Clegg, Amy D, NP  furosemide (LASIX) 40 MG tablet Take 1 tablet (40 mg total) by mouth daily. May take extra 40mg  daily as needed for swelling 10/13/19   Robbie LisSimmons, Brittainy M, PA-C  potassium chloride SA (K-DUR) 20 MEQ tablet Take 1 tablet (20 mEq total) by mouth daily. Take 1 tab when you take a dose of lasix as needed 04/16/19   Clegg, Amy D, NP  sacubitril-valsartan (ENTRESTO) 97-103 MG Take 1 tablet by mouth 2 (two) times daily. 10/21/19   Allayne ButcherSimmons, Brittainy M, PA-C    Past Medical History: Past Medical History:  Diagnosis Date  . Arthritis    "hands; maybe my toes" (12/20/2017)   . CAD (coronary artery disease)   . Cardiac arrest ([facility]) 11/29/2017   hx VF arrest/notes 12/20/2017  . Coronary artery disease   . GERD (gastroesophageal reflux disease)   . H/O ETOH abuse    /notes 12/20/2017  . High cholesterol   . Hypertension   . Ischemic cardiomyopathy    a. 08/2019 s/p BSX D232 single lead AICD.  Marland Kitchen. Seizures ([facility]) ~ 2016; ~ 2017   "I've had a couple; I was at work" (12/20/2017)  . Syncope and collapse    "last few days" (12/20/2017)    Past Surgical History: Past Surgical History:  Procedure Laterality Date  . CARDIAC CATHETERIZATION    . CORONARY ARTERY BYPASS GRAFT N/A 12/07/2017   Procedure: CORONARY ARTERY BYPASS GRAFTING (CABG) times four using left internal mammary artery and right saphenous vein using endoscope for harvest.;  Surgeon: Kerin PernaVan Trigt, Peter, MD;  [facility];  Service: Open Heart Surgery;  Laterality: N/A;  . IABP INSERTION N/A 11/29/2017   Procedure: IABP Insertion;  Surgeon: [name], [name], MD;  Location: [facility] INVASIVE CV LAB;  Service: Cardiovascular;  Laterality: N/A;  . IABP INSERTION N/A 12/03/2017   Procedure: IABP INSERTION;  Surgeon: Dolores PattyBensimhon, Dianely Krehbiel R, MD;  Location: [facility] INVASIVE CV LAB;  Service: Cardiovascular;  Laterality: N/A;  . ICD IMPLANT N/A 08/29/2019   Procedure: ICD IMPLANT;  Surgeon: Marinus Mawaylor, Gregg W, MD;  [facility];  Service: Cardiovascular;  Laterality: N/A;  . LEFT HEART CATH AND CORONARY ANGIOGRAPHY N/A 11/29/2017   Procedure: LEFT HEART CATH AND CORONARY ANGIOGRAPHY;  Surgeon: [name], [name], MD;  Location: [facility] INVASIVE CV LAB;  Service: Cardiovascular;  Laterality: N/A;  . RIGHT HEART CATH N/A 11/29/2017   Procedure: RIGHT HEART CATH;  Surgeon: Dolores PattyBensimhon, Brittini Brubeck R, MD;  Location: [facility] INVASIVE CV LAB;  Service: Cardiovascular;  Laterality: N/A;  . TEE WITHOUT CARDIOVERSION N/A 12/07/2017   Procedure: TRANSESOPHAGEAL ECHOCARDIOGRAM (TEE);  Surgeon: [name], [name], MD;  [facility];  Service:  Open Heart Surgery;  Laterality: N/A;  . TONSILLECTOMY AND ADENOIDECTOMY  ~ 1972    Family History:  Family History  Problem Relation Age of Onset  . Heart attack Other     Social History: Social History   Socioeconomic History  . Marital status: Divorced    Spouse name: Not on file  . Number of children: Not on file  . Years of education: Not on file  . Highest education level: Not on file  Occupational History  . Not on file  Social Needs  . Financial resource strain: Not on file  . Food insecurity    Worry: Not on file    Inability: Not on file  . Transportation needs  Medical: Not on file    Non-medical: Not on file  Tobacco Use  . Smoking status: Never Smoker  . Smokeless tobacco: Never Used  Substance and Sexual Activity  . Alcohol use: Yes    Comment: 12/20/2017 "I quit drinking after I had the heart attack"  . Drug use: No  . Sexual activity: Not Currently  Lifestyle  . Physical activity    Days per week: Not on file    Minutes per session: Not on file  . Stress: Not on file  Relationships  . Social Herbalist on phone: Not on file    Gets together: Not on file    Attends religious service: Not on file    Active member of club or organization: Not on file    Attends meetings of clubs or organizations: Not on file    Relationship status: Not on file  Other Topics Concern  . Not on file  Social History Narrative  . Not on file    Allergies:  No Known Allergies  Objective:    Vital Signs:   SpO2:  [97 %] 97 % (10/27 1053)   There were no vitals filed for this visit.   Physical Exam     General: No respiratory difficulty HEENT: Normal Neck: Supple. no JVD. Carotids 2+ bilat; no bruits. No lymphadenopathy or thyromegaly appreciated. Cor: PMI nondisplaced. Regular rate & rhythm. No rubs, gallops or murmurs. Lungs: Clear Abdomen: Soft, nontender, nondistended. No hepatosplenomegaly. No bruits or masses. Good bowel sounds.  Extremities: No cyanosis, clubbing, rash, edema Neuro: Alert & oriented x 3, cranial nerves grossly intact. moves all 4 extremities w/o difficulty. Affect pleasant.   Telemetry   SR  80s personally reviewed  EKG   ST 117 bpm   Labs     Basic Metabolic Panel: No results for input(s): NA, K, CL, CO2, GLUCOSE, BUN, CREATININE, CALCIUM, MG, PHOS in the last 168 hours.  Liver Function Tests: No results for input(s): AST, ALT, ALKPHOS, BILITOT, PROT, ALBUMIN in the last 168 hours. No results for input(s): LIPASE, AMYLASE in the last 168 hours. No results for input(s): AMMONIA in the last 168 hours.  CBC: Recent Labs  Lab 10/21/19 1045  WBC 8.7  HGB 14.1  HCT 43.1  MCV 103.9*  PLT 184    Cardiac Enzymes: No results for input(s): CKTOTAL, CKMB, CKMBINDEX, TROPONINI in the last 168 hours.  BNP: BNP (last 3 results) Recent Labs    02/25/19 0919 05/20/19 0917 07/16/19 1223  BNP 135.4* 104.2* 125.2*    ProBNP (last 3 results) No results for input(s): PROBNP in the last 8760 hours.   CBG: Recent Labs  Lab 10/21/19 1146  GLUCAP 171*    Coagulation Studies: No results for input(s): LABPROT, INR in the last 72 hours.  Imaging: Dg Chest Port 1 View  Result Date: 10/21/2019 CLINICAL DATA:  Chest pain EXAM: PORTABLE CHEST 1 VIEW COMPARISON:  08/30/2019 FINDINGS: Status post median sternotomy and CABG with left chest single lead pacer defibrillator. Unchanged elevation of the left hemidiaphragm without acute airspace opacity. The visualized skeletal structures are unremarkable. IMPRESSION: Unchanged elevation of the left hemidiaphragm without acute abnormality of the lungs in AP portable projection. Electronically Signed   By: Eddie Candle M.D.   On: 10/21/2019 11:03       Assessment/Plan   1. New Onset Seizure  Witnessed x 2. Check CT of head. EEG.  - Neurology consulted.   2. Possible PEA arrest CPR  initiated with ROSC.  - Cycle HS Trop ICD interrogated.  No arrhythmias noted.  3. Chronic systolic CHF with ICM - TTE 11/30/17 25-30% RV normal. Echo 03/2018: EF40-45%RV normal.  - ECHO 04/2019  EF ~ 25%. [employer] ICD - He does not appear volume overloaded. Hold lasix, entresto, spiro with recent N/V/D  for now.  -Continue coreg to 6.25 mg bid  4. 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  5 VF arrest in setting of STEMI - now has ICD. No shocks.   6. . ETOH abuse   Admit to progressive. Had N/V 24 hours. Hold diuretics.Check labs now.  -Check CT of head & EEG - Consult Neurology.    Tonye Becket, NP 10/21/2019, 11:55 AM  Advanced Heart Failure Team Pager [phone] (M-F; 7a - 4p)  Please contact [facility] for night-coverage after hours (4p -7a ) and weekends on [email or web address]   Agree with above.   58 y/o male with h/o ETOH abuse, CAD s/p CABG with iCM EF 25-30%.   Was seen in clinic today and had witnessed seizure activity with loss of pulse and apparent PEA arrest requiring CPR with rapid ROSC. Initial rhythm after ROSC was sinus tach. Denies antecedent CP or palpitations. ICD interrogated personally in ER. No arrhythmias  While in ER had repeat seizure activity. Neurology has seen.   General:  Well appearing. No resp difficulty HEENT: normal Neck: supple. no JVD. Carotids 2+ bilat; no bruits. No lymphadenopathy or thryomegaly appreciated. Cor: PMI nondisplaced. Regular rate & rhythm. No rubs, gallops or murmurs. Lungs: clear Abdomen: soft, nontender, nondistended. No hepatosplenomegaly. No bruits or masses. Good bowel sounds. Extremities: no cyanosis, clubbing, rash, edema Neuro: alert & orientedx3, cranial nerves grossly intact. moves all 4 extremities w/o difficulty. Affect pleasant  Apparent PEA arrest in the setting of new-onset seizure activity. Doubt cardiac in origin. Cycle cardiac markers. Plan head CT and EEG. Neurology loading Keppra. Will need CIWA protocol. Check electrolytes.    CRITICAL CARE Performed by: Arvilla Meres  Total critical care time: 55 minutes  Critical care time was exclusive of separately billable procedures and treating other patients.  Critical care was necessary to treat or prevent imminent or life-threatening deterioration.  Critical care was time spent personally by me (independent of midlevel providers or residents) on the following activities: development of treatment plan with patient and/or surrogate as well as nursing, discussions with consultants, evaluation of patient's response to treatment, examination of patient, obtaining history from patient or surrogate, ordering and performing treatments and interventions, ordering and review of laboratory studies, ordering and review of radiographic studies, pulse oximetry and re-evaluation of patient's condition.  Arvilla Meres, MD  12:40 PM

## 2019-10-21 NOTE — ED Notes (Signed)
Patient transported to CT 

## 2019-10-21 NOTE — Procedures (Signed)
Patient Name: [name]  MRN: [id number]  Epilepsy Attending: Lora Havens  Referring Physician/Provider: Darrick Grinder, NP Date: 10/21/2019 Duration: 25.23 mins  Patient history: 58yo m with seizure like episode. EEg to evaluate for seizure.  Level of alertness: awake, asleep  AEDs during EEG study: None  Technical aspects: This EEG study was done with scalp electrodes positioned according to the 10-20 International system of electrode placement. Electrical activity was acquired at a sampling rate of 500Hz  and reviewed with a high frequency filter of 70Hz  and a low frequency filter of 1Hz . EEG data were recorded continuously and digitally stored.   DESCRIPTION: During awake state, the posterior dominant rhythm consists of 10-11 Hz activity of moderate voltage (25-35 uV) seen predominantly in posterior head regions, symmetric and reactive to eye opening and eye closing. Sleep was characterized by vertex waves, sleep spindles ( 12-14hz ), maximal frontocentral.  There is an excessive amount of 15 to 18 Hz, 2-3 uV beta activity with irregular morphology distributed symmetrically and diffusely. Physiologic photic driving was seen during photic stimulation. Hyperventilation was not performed.  ABNORMALITY - Excessive fast, generalized  IMPRESSION: This study is within normal limits. No seizures or epileptiform discharges were seen throughout the recording.  The excessive beta activity seen in the background is most likely due to the effect of benzodiazepine and is a benign EEG pattern.   [name] [name]

## 2019-10-21 NOTE — Patient Instructions (Signed)
INCREASE Entresto to 97/103 mg, one tab twice daily CONTINUE  Lasix 40 MG DAILY  Labs today We will only contact you if something comes back abnormal or we need to make some changes. Otherwise no news is good news!  Your physician recommends that you schedule a follow-up appointment in: 3-4 weeks

## 2019-10-21 NOTE — Progress Notes (Signed)
EEG complete - results pending 

## 2019-10-21 NOTE — Consult Note (Signed)
Neurology Consultation Reason for Consult: Seizures Referring Physician: [name], D  CC: Seizures  History is obtained from: Chart review  HPI: [name] is a 58 y.o. male with a history of a seizure that was presumed to be alcohol withdrawal related in the past treated at [facility].  At that time he had not been feeling well for couple of days.  He does have a history of disorientation and withdrawal symptoms per that H&P in 2016, but that has been the first seizure associated with alcohol withdrawal.  Today he was at heart failure clinic at which point he was seen to be slumped over and blue.  It was then noticed that he was having seizure-like activity.  CPR was performed when no clear pulse was detected.   He was brought into the emergency department where his mental status gradually improved and he began following commands and answering some questions, but he subsequently developed convulsive activity again.  Nursing describes it as commencing with a vocalization and subsequent bilateral flexion with clonic activity.  He had a leftward gaze during and after the seizure.  This lasted for approximately 45 seconds.  I was present in the emergency department at the time of his second seizure and therefore responded shortly after he stopped seizing.  ROS: A 14 point ROS was performed and is negative except as noted in the HPI.   Past Medical History:  Diagnosis Date  . Arthritis    "hands; maybe my toes" (12/20/2017)  . CAD (coronary artery disease)   . Cardiac arrest (HCC) 11/29/2017   hx VF arrest/notes 12/20/2017  . Coronary artery disease   . GERD (gastroesophageal reflux disease)   . H/O ETOH abuse    /notes 12/20/2017  . High cholesterol   . Hypertension   . Ischemic cardiomyopathy    a. 08/2019 s/p BSX D232 single lead AICD.  Marland Kitchen Seizures (HCC) ~ 2016; ~ 2017   "I've had a couple; I was at work" (12/20/2017)  . Syncope and collapse    "last few days" (12/20/2017)      Family History  Problem Relation Age of Onset  . Heart attack Other      Social History:  reports that he has never smoked. He has never used smokeless tobacco. He reports current alcohol use. He reports that he does not use drugs.   Exam: Current vital signs: SpO2 97%  Vital signs in last 24 hours: Pulse Rate:  [96] 96 (10/27 0947) BP: (140)/(79) 140/79 (10/27 0947) SpO2:  [97 %] 97 % (10/27 1053) Weight:  [69.7 kg] 69.7 kg (10/27 0947)   Physical Exam  Constitutional: Appears well-developed and well-nourished.  Psych: Affect appropriate to situation Eyes: No scleral injection HENT: No OP obstrucion Head: Normocephalic.  Cardiovascular: Normal rate and regular rhythm.  Respiratory: Effort normal, non-labored breathing GI: Soft.  No distension. There is no tenderness.  Skin: WDI  Neuro: Mental Status: Patient is awake and improving though still encephalopathic.  Suspect is postictal, he has purposeful movements bilaterally, but does not follow commands. Cranial Nerves: II: Blinks to threat bilaterally.  Pupils are equal, round, and reactive to light.   III,IV, VI: EOMI without ptosis or diploplia.  V: Facial sensation is symmetric to temperature VII: Facial movement is symmetric.  VIII: hearing is intact to voice Motor: He moves all extremities spontaneously without clear focality Sensory: He responds to noxious simulation in all 4 extremities Cerebellar: Does not perform   I have reviewed labs in epic  and the results pertinent to this consultation are: CBC-unremarkable BMP-creatinine 1.48, sodium of 136, calcium 9.3  I have reviewed the images obtained: CT head-  Impression: 57 year old male presenting with 2 seizures.  The eye deviation is suggestive of a focal seizure with secondary generalization, without other features suggestive of that, alcohol withdrawal is still a possibility.  We will try to obtain ancillary information regarding his drinking  history and whether this is likely, however in the interim I would favor starting Keppra.  Recommendations: 1) Keppra 1500x1 followed by 500 mg twice daily 2) CT head, if possible with pacemaker would obtain MRI brain 3) EEG   Roland Rack, MD [facility] [phone]  If 7pm- 7am, please page neurology on call as listed in [place].

## 2019-10-21 NOTE — Progress Notes (Signed)
Dr. Leonel Ramsay is aware pt has [place] ICD and exam will have to wait until 10/22/19.

## 2019-10-21 NOTE — ED Triage Notes (Signed)
Pt was at the CHF center for routine visit when he had seizure like activity "jerking" and he lost pulses, CPR initiated by staff at the CHF center. No meds were given. ROSC achieved as soon as patient was brought into ED room 33. Pt is currently alert, confused and having trouble following commands. Zoll pads on patient.

## 2019-10-21 NOTE — ED Provider Notes (Signed)
[facility] EMERGENCY DEPARTMENT Provider Note   CSN: [id number] Arrival date & time: 10/21/19  1045     History   Chief Complaint Chief Complaint  Patient presents with   Seizures    HPI [name] is a 58 y.o. male.     Pt presents to the ED today from the CHF clinic.  The pt was there for a routine follow up.  He was put in a room and the staff witnessed some seizure like activity.  The pt was found to be pulseless and apneic, so the nurse started CPR.  Pt is now awake, but confused.  Pt denies any cp.  Pt has a hx of a vfib arrest in December of 2018.  He had a Environmental [employer] ICD placed on 08/29/19.  He has had a seizure in the past, but does not have a diagnosed seizure d/o.     Past Medical History:  Diagnosis Date   AICD (automatic cardioverter/defibrillator) present    Arthritis    "hands; maybe my toes" (12/20/2017)   CAD (coronary artery disease)    Cardiac arrest (HCC) 11/29/2017   hx VF arrest/notes 12/20/2017   Coronary artery disease    GERD (gastroesophageal reflux disease)    H/O ETOH abuse    /notes 12/20/2017   High cholesterol    Hypertension    Ischemic cardiomyopathy    a. 08/2019 s/p BSX D232 single lead AICD.   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)     Past Surgical History:  Procedure Laterality Date   CARDIAC CATHETERIZATION     CORONARY ARTERY BYPASS GRAFT N/A 12/07/2017   Procedure: CORONARY ARTERY BYPASS  GRAFTING (CABG) times four using left internal mammary artery and right saphenous vein using endoscope for harvest.;  Surgeon: [name], MD;  Location: [facility] OR;  Service: Open Heart Surgery;  Laterality: N/A;   IABP INSERTION N/A 11/29/2017   Procedure: IABP Insertion;  Surgeon: [name], MD;  Location: [facility] INVASIVE CV LAB;  Service: Cardiovascular;  Laterality: N/A;   IABP INSERTION N/A 12/03/2017   Procedure: IABP INSERTION;  Surgeon: [name], MD;  Location: [facility] INVASIVE CV LAB;  Service: Cardiovascular;  Laterality: N/A;   ICD IMPLANT N/A 08/29/2019   Procedure: ICD IMPLANT;  Surgeon: Marinus Maw, MD;  Location: [facility] INVASIVE CV LAB;  Service: Cardiovascular;  Laterality: N/A;   LEFT HEART CATH AND CORONARY ANGIOGRAPHY N/A 11/29/2017   Procedure: LEFT HEART CATH AND CORONARY ANGIOGRAPHY;  Surgeon: [name], MD;  Location: [facility] INVASIVE CV LAB;  Service: Cardiovascular;  Laterality: N/A;   RIGHT HEART CATH N/A 11/29/2017   Procedure: RIGHT HEART CATH;  Surgeon: [name], MD;  Location: [facility] INVASIVE CV LAB;  Service: Cardiovascular;  Laterality: N/A;   TEE WITHOUT CARDIOVERSION N/A 12/07/2017   Procedure: TRANSESOPHAGEAL ECHOCARDIOGRAM (TEE);  Surgeon: Donata Clay, Theron Arista, MD;  Location: [facility] OR;  Service: Open Heart Surgery;  Laterality: N/A;   TONSILLECTOMY AND  ADENOIDECTOMY  ~ 1972        Home Medications    Prior to Admission medications   Medication Sig Start Date End Date Taking? Authorizing Provider  atorvastatin (LIPITOR) 80 MG tablet Take 1 tablet (80 mg total) by mouth daily at 6 PM. 04/08/19  Yes Georgiana Shore, NP  carvedilol (COREG) 6.25 MG tablet Take 1 tablet (6.25 mg total) by mouth 2 (two) times daily with a meal. 06/09/19  Yes Clegg, Amy D, NP  furosemide (LASIX) 40 MG tablet Take 1 tablet (40 mg total) by mouth daily. May take extra 40mg  daily as needed for swelling 10/13/19  Yes Simmons, Brittainy M, PA-C  potassium chloride SA  (K-DUR) 20 MEQ tablet Take 1 tablet (20 mEq total) by mouth daily. Take 1 tab when you take a dose of lasix as needed 04/16/19  Yes Clegg, Amy D, NP  sacubitril-valsartan (ENTRESTO) 97-103 MG Take 1 tablet by mouth 2 (two) times daily. 10/21/19  Yes Consuelo Pandy, PA-C    Family History Family History  Problem Relation Age of Onset   Heart attack Other     Social History Social History   Tobacco Use   Smoking status: Never Smoker   Smokeless tobacco: Never Used  Substance Use Topics   Alcohol use: Yes    Comment: 12/20/2017 "I quit drinking after I had the heart attack"   Drug use: No     Allergies   Patient has no known allergies.   Review of Systems Review of Systems  All other systems reviewed and are negative.    Physical Exam Updated Vital Signs BP 111/85 (BP Location: Right Arm)    Pulse 87    Temp 97.8 F (36.6 C) (Oral)    Resp 19    Ht 5\' 9"  (1.753 m)    Wt 66.2 kg    SpO2 96%    BMI 21.55 kg/m   Physical Exam Vitals signs and nursing note reviewed.  Constitutional:      Appearance: Normal appearance.  HENT:     Head: Normocephalic and atraumatic.     Right Ear: External ear normal.     Left Ear: External ear normal.     Nose: Nose normal.     Mouth/Throat:     Mouth: Mucous membranes are moist.     Pharynx: Oropharynx is clear.  Eyes:     Extraocular Movements: Extraocular movements intact.     Conjunctiva/sclera: Conjunctivae normal.     Pupils: Pupils are equal, round, and reactive to light.  Neck:     Musculoskeletal: Normal range of motion and neck supple.  Cardiovascular:     Rate and Rhythm: Normal rate and regular rhythm.     Pulses: Normal pulses.     Heart sounds: Normal heart sounds.  Pulmonary:     Effort: Pulmonary effort is normal.     Breath sounds: Normal breath sounds.  Abdominal:     General: Abdomen is flat. Bowel sounds are normal.     Palpations: Abdomen is soft.  Musculoskeletal: Normal range of motion.    Skin:    General: Skin is warm.     Capillary Refill: Capillary refill takes less than 2 seconds.  Neurological:     General: No focal deficit present.     Mental Status: He is alert.     Comments: Pt was initially slightly confused and slow to follow commands, but this is improving.  Psychiatric:  Mood and Affect: Mood normal.        Behavior: Behavior normal.        Thought Content: Thought content normal.        Judgment: Judgment normal.      ED Treatments / Results  Labs (all labs ordered are listed, but only abnormal results are displayed) Labs Reviewed  BASIC METABOLIC PANEL - Abnormal; Notable for the following components:      Result Value   CO2 15 (*)    Glucose, Bld 189 (*)    Creatinine, Ser 1.48 (*)    GFR calc non Af Amer 52 (*)    Anion gap 21 (*)    All other components within normal limits  CBC - Abnormal; Notable for the following components:   RBC 4.15 (*)    MCV 103.9 (*)    All other components within normal limits  CBC - Abnormal; Notable for the following components:   RBC 3.92 (*)    Platelets 144 (*)    All other components within normal limits  CREATININE, SERUM - Abnormal; Notable for the following components:   Creatinine, Ser 1.32 (*)    GFR calc non Af Amer 60 (*)    All other components within normal limits  CBG MONITORING, ED - Abnormal; Notable for the following components:   Glucose-Capillary 171 (*)    All other components within normal limits  SARS CORONAVIRUS 2 BY RT PCR (HOSPITAL ORDER, PERFORMED IN [facility])  ETHANOL  HIV ANTIBODY (ROUTINE TESTING W REFLEX)  BASIC METABOLIC PANEL  RAPID URINE DRUG SCREEN, HOSP PERFORMED  URINALYSIS, ROUTINE W REFLEX MICROSCOPIC  TROPONIN I (HIGH SENSITIVITY)  TROPONIN I (HIGH SENSITIVITY)  TROPONIN I (HIGH SENSITIVITY)    EKG EKG Interpretation  Date/Time:  Tuesday October 21 2019 10:47:40 EDT Ventricular Rate:  117 PR Interval:    QRS Duration: 111 QT  Interval:  343 QTC Calculation: 479 R Axis:   45 Text Interpretation: Sinus tachycardia Abnormal inferior Q waves Probable anterior infarct, age indeterminate Since last tracing rate faster Confirmed by Jacalyn LefevreHaviland, Wilbur [place] [phone]([id number]) on 10/21/2019 12:20:08 PM   Radiology Ct Head Wo Contrast  Result Date: 10/21/2019 CLINICAL DATA:  Seizure EXAM: CT HEAD WITHOUT CONTRAST TECHNIQUE: Contiguous axial images were obtained from the base of the skull through the vertex without intravenous contrast. COMPARISON:  12/20/2017 FINDINGS: Brain: There is atrophy and chronic small vessel disease changes. No acute intracranial abnormality. Specifically, no hemorrhage, hydrocephalus, mass lesion, acute infarction, or significant intracranial injury. Vascular: No hyperdense vessel or unexpected calcification. Skull: No acute calvarial abnormality. Sinuses/Orbits: Visualized paranasal sinuses and mastoids clear. Orbital soft tissues unremarkable. Other: None IMPRESSION: Atrophy, chronic microvascular disease. No acute intracranial abnormality. Electronically Signed   By: Charlett NoseKevin  Dover M.D.   On: 10/21/2019 12:50   Mr Laqueta JeanBrain W WGWo Contrast  Result Date: 10/22/2019 CLINICAL DATA:  Seizure, new, nontraumatic, >40 years. EXAM: MRI HEAD WITHOUT AND WITH CONTRAST TECHNIQUE: Multiplanar, multiecho pulse sequences of the brain and surrounding structures were obtained without and with intravenous contrast. CONTRAST:  7mL GADAVIST GADOBUTROL 1 MMOL/ML IV SOLN COMPARISON:  Head CT 10/21/2019, brain MRI 08/18/2015 FINDINGS: Brain: Moderate motion degradation of the coronal T2 weighted FLAIR sequence oriented perpendicular to the long axis the hippocampal formations. Mild motion degradation of multiple digital sequences. There is no convincing evidence of acute infarct. No evidence of intracranial mass. No midline shift or extra-axial fluid collection. No chronic intracranial blood products. Mild scattered T2/FLAIR hyperintensity within  the  cerebral white matter is nonspecific, but consistent with chronic small vessel ischemic disease. Redemonstrated chronic lacunar infarcts within the bilateral basal ganglia. This includes a chronic lacunar infarct within the right caudate head. A chronic lacunar infarct within the left cerebellum is new from prior MRI 08/18/2015. As before, there is symmetric atrophy of the mesial temporal lobes. Stable generalized parenchymal atrophy, again markedly advanced for patient age. No abnormal intracranial enhancement is identified. Vascular: Flow voids maintained within the proximal large arterial vessels. Skull and upper cervical spine: No focal marrow lesion. Sinuses/Orbits: Visualized orbits demonstrate no acute abnormality. Mild ethmoid sinus mucosal thickening. Trace fluid within left mastoid air cells. IMPRESSION: 1. Motion degraded examination. 2. No evidence of acute intracranial abnormality. 3. No specific cause of seizure is identified. 4. Redemonstrated chronic bilateral basal ganglia lacunar infarcts. A chronic lacunar infarct within the left cerebellum is new from prior MRI 08/18/2015. There is a background of mild chronic small vessel ischemic disease. 5. Stable generalized parenchymal atrophy, markedly advanced for age. Electronically Signed   By: Jackey Loge DO   On: 10/22/2019 14:57   Dg Chest Port 1 View  Result Date: 10/21/2019 CLINICAL DATA:  Chest pain EXAM: PORTABLE CHEST 1 VIEW COMPARISON:  08/30/2019 FINDINGS: Status post median sternotomy and CABG with left chest single lead pacer defibrillator. Unchanged elevation of the left hemidiaphragm without acute airspace opacity. The visualized skeletal structures are unremarkable. IMPRESSION: Unchanged elevation of the left hemidiaphragm without acute abnormality of the lungs in AP portable projection. Electronically Signed   By: Lauralyn Primes M.D.   On: 10/21/2019 11:03    Procedures Procedures (including critical care time)  Medications  Ordered in ED Medications  atorvastatin (LIPITOR) tablet 80 mg (80 mg Oral Given 10/21/19 1722)  sodium chloride flush (NS) 0.9 % injection 3 mL (3 mLs Intravenous Given 10/22/19 1145)  sodium chloride flush (NS) 0.9 % injection 3 mL (has no administration in time range)  0.9 %  sodium chloride infusion (has no administration in time range)  acetaminophen (TYLENOL) tablet 650 mg (has no administration in time range)  ondansetron (ZOFRAN) injection 4 mg (has no administration in time range)  enoxaparin (LOVENOX) injection 40 mg (40 mg Subcutaneous Given 10/22/19 1146)  levETIRAcetam (KEPPRA) tablet 500 mg (500 mg Oral Given 10/22/19 1144)  spironolactone (ALDACTONE) tablet 12.5 mg (12.5 mg Oral Given 10/22/19 1144)  carvedilol (COREG) tablet 3.125 mg (has no administration in time range)  LORazepam (ATIVAN) injection ( Intravenous Canceled Entry 10/21/19 1200)  levETIRAcetam (KEPPRA) IVPB 1500 mg/ 100 mL premix (0 mg Intravenous Stopped 10/21/19 1229)  LORazepam (ATIVAN) injection 2 mg (1 mg Intravenous Given 10/21/19 1209)  gadobutrol (GADAVIST) 1 MMOL/ML injection 7 mL (7 mLs Intravenous Contrast Given 10/22/19 1444)   (has no administration in time range)     Initial Impression / Assessment and Plan / ED Course  I have reviewed the triage vital signs and the nursing notes.  Pertinent labs & imaging results that were available during my care of the patient were reviewed by me and considered in my medical decision making (see chart for details).     Pt did have his ICD interrogated, and no events were recorded.  Pt seen by Dr. Gala Romney who will admit pt.    Pt did have about a 45 second seizure while here.  It was tonic clonic in nature.  Pt was given 1 mg of ativan and seizure broke.  The pt was seen by Dr. Amada Jupiter who  added on Keppra and a head CT.     Pt is now back to his normal MS.  CRITICAL CARE Performed by: Jacalyn Lefevre   Total critical care time: 30  minutes  Critical care time was exclusive of separately billable procedures and treating other patients.  Critical care was necessary to treat or prevent imminent or life-threatening deterioration.  Critical care was time spent personally by me on the following activities: development of treatment plan with patient and/or surrogate as well as nursing, discussions with consultants, evaluation of patient's response to treatment, examination of patient, obtaining history from patient or surrogate, ordering and performing treatments and interventions, ordering and review of laboratory studies, ordering and review of radiographic studies, pulse oximetry and re-evaluation of patient's condition.  [name] was evaluated in Emergency Department on 10/22/2019 for the symptoms described in the history of present illness. He was evaluated in the context of the global COVID-19 pandemic, which necessitated consideration that the patient might be at risk for infection with the SARS-CoV-2 virus that causes COVID-19. Institutional protocols and algorithms that pertain to the evaluation of patients at risk for COVID-19 are in a state of rapid change based on information released by regulatory bodies including the CDC and federal and state organizations. These policies and algorithms were followed during the patient's care in the ED.  Final Clinical Impressions(s) / ED Diagnoses   Final diagnoses:  Seizure [facility])    ED Discharge Orders         Ordered    Ambulatory referral to Neurology    Comments: An appointment is requested in approximately: 2 weeks   10/22/19 9767           Jacalyn Lefevre, MD 10/22/19 1553

## 2019-10-21 NOTE — ED Notes (Signed)
Pt sat up in bed and screamed out and then started total body jerking with left sided gaze. Pt turned on his side. Seizure lasted about 45 seconds. Pt now post ictal: sleeping, not following commands, HR 145 sinus rhythm, nasal cannula increased to 6L, O2-97%. Dr. Gilford Raid came to bedside and gave verbal order for 1mg  of Ativan IV. Dr. Leonel Ramsay also at bedside.

## 2019-10-22 ENCOUNTER — Encounter (HOSPITAL_COMMUNITY): Payer: Self-pay | Admitting: General Practice

## 2019-10-22 ENCOUNTER — Other Ambulatory Visit: Payer: Self-pay

## 2019-10-22 ENCOUNTER — Inpatient Hospital Stay (HOSPITAL_COMMUNITY): Payer: Medicaid Other

## 2019-10-22 DIAGNOSIS — R569 Unspecified convulsions: Secondary | ICD-10-CM | POA: Diagnosis not present

## 2019-10-22 DIAGNOSIS — I469 Cardiac arrest, cause unspecified: Secondary | ICD-10-CM | POA: Diagnosis not present

## 2019-10-22 LAB — BASIC METABOLIC PANEL
Anion gap: 11 (ref 5–15)
BUN: 12 mg/dL (ref 6–20)
CO2: 25 mmol/L (ref 22–32)
Calcium: 9.2 mg/dL (ref 8.9–10.3)
Chloride: 103 mmol/L (ref 98–111)
Creatinine, Ser: 1.24 mg/dL (ref 0.61–1.24)
GFR calc Af Amer: >60 mL/min (ref >60)
GFR calc non Af Amer: >60 mL/min (ref >60)
Glucose, Bld: 95 mg/dL (ref 70–99)
Potassium: 3.8 mmol/L (ref 3.5–5.1)
Sodium: 139 mmol/L (ref 135–145)

## 2019-10-22 LAB — TROPONIN I (HIGH SENSITIVITY): Troponin I (High Sensitivity): 12 ng/L (ref <18)

## 2019-10-22 MED ORDER — SPIRONOLACTONE 12.5 MG HALF TABLET
12.5000 mg | ORAL_TABLET | Freq: Every day | ORAL | Status: DC
  Administered 2019-10-22 – 2019-10-23 (×2): 12.5 mg via ORAL
  Filled 2019-10-22 (×2): qty 1

## 2019-10-22 MED ORDER — GADOBUTROL 1 MMOL/ML IV SOLN
7.0000 mL | Freq: Once | INTRAVENOUS | Status: AC | PRN
  Administered 2019-10-22: 7 mL via INTRAVENOUS

## 2019-10-22 MED ORDER — CARVEDILOL 3.125 MG PO TABS
3.1250 mg | ORAL_TABLET | Freq: Two times a day (BID) | ORAL | Status: DC
  Administered 2019-10-22 – 2019-10-23 (×2): 3.125 mg via ORAL
  Filled 2019-10-22 (×2): qty 1

## 2019-10-22 MED ORDER — LEVETIRACETAM 500 MG PO TABS
500.0000 mg | ORAL_TABLET | Freq: Two times a day (BID) | ORAL | Status: DC
  Administered 2019-10-22 – 2019-10-23 (×3): 500 mg via ORAL
  Filled 2019-10-22 (×3): qty 1

## 2019-10-22 MED FILL — Medication: Qty: 1 | Status: AC

## 2019-10-22 NOTE — Progress Notes (Signed)
Subjective: Much improved this am, reports about 6 beers/day typically, no recent change.   Exam: Vitals:   10/22/19 0800 10/22/19 0828  BP:  111/85  Pulse: 95 87  Resp:    Temp:  97.8 F (36.6 C)  SpO2: 94% 96%   Gen: In bed, NAD Resp: non-labored breathing, no acute distress Abd: soft, nt  Neuro: MS: awake, alert interactive and appropriate.  CN: VFF, face symmetric Motor: moves all extremities well.  Sensory:intact to LT  Pertinent Labs: BMP - unremarkable  Impression: 58 yo M with two seizures yesterday. Without change in EtOH consumption, I think that alcohol withdrawal is less likely.  The gaze deviation is not definitive, it is a suggestion of a focality, and with his history of seizure, recurrent seizures within 24 hours, and suggestion of focality, I would favor starting antiepileptic therapy at this time.  MRI could be useful for long-term planning.  Recommendations: 1) MRI brain with and without contrast 2) Keppra 500 mg twice daily 3) I have requested follow-up with outpatient neurology 4) Patient is unable to drive, operate heavy machinery, perform activities at heights or participate in water activities until release by outpatient physician. This was discussed with the patient who expressed understanding.   Roland Rack, MD [facility] [phone]  If 7pm- 7am, please page neurology on call as listed in [place].

## 2019-10-22 NOTE — Progress Notes (Addendum)
Advanced Heart Failure Rounding Note  PCP-Cardiologist: No primary care provider on file.   Subjective:    Admitted with new onset seizures. No seizure over night. Started on keppra.   Feeling ok. He doesn't remember what happened. Denies SOB.     Objective:   Weight Range: 66.2 kg Body mass index is 21.55 kg/m.   Vital Signs:   Temp:  [97.8 F (36.6 C)-98.4 F (36.9 C)] 97.8 F (36.6 C) (10/28 0828) Pulse Rate:  [86-127] 87 (10/28 0828) Resp:  [14-25] 19 (10/27 1600) BP: (106-135)/(73-97) 111/85 (10/28 0828) SpO2:  [94 %-100 %] 96 % (10/28 0828) FiO2 (%):  [21 %] 21 % (10/27 1321) Weight:  [63.5 kg-66.2 kg] 66.2 kg (10/28 0443) Last BM Date: 10/20/19  Weight change: Filed Weights   10/21/19 1317 10/22/19 0443  Weight: 63.5 kg 66.2 kg    Intake/Output:   Intake/Output Summary (Last 24 hours) at 10/22/2019 0944 Last data filed at 10/22/2019 0400 Gross per 24 hour  Intake 380 ml  Output 375 ml  Net 5 ml      Physical Exam    General:  Well appearing. No resp difficulty HEENT: Normal Neck: Supple. JVP flat  . Carotids 2+ bilat; no bruits. No lymphadenopathy or thyromegaly appreciated. Cor: PMI nondisplaced. Regular rate & rhythm. No rubs, gallops or murmurs. Lungs: Clear Abdomen: Soft, nontender, nondistended. No hepatosplenomegaly. No bruits or masses. Good bowel sounds. Extremities: No cyanosis, clubbing, rash, edema Neuro: Alert & orientedx3, cranial nerves grossly intact. moves all 4 extremities w/o difficulty. Affect pleasant   Telemetry   SR/ST 80-100s   EKG    N/A  Labs    CBC Recent Labs    10/21/19 1045 10/21/19 1327  WBC 8.7 6.1  HGB 14.1 13.2  HCT 43.1 39.0  MCV 103.9* 99.5  PLT 184 323*   Basic Metabolic Panel Recent Labs    10/21/19 1045 10/21/19 1327 10/22/19 0422  NA 136  --  139  K 4.8  --  3.8  CL 100  --  103  CO2 15*  --  25  GLUCOSE 189*  --  95  BUN 13  --  12  CREATININE 1.48* 1.32* 1.24  CALCIUM 9.3   --  9.2   Liver Function Tests No results for input(s): AST, ALT, ALKPHOS, BILITOT, PROT, ALBUMIN in the last 72 hours. No results for input(s): LIPASE, AMYLASE in the last 72 hours. Cardiac Enzymes No results for input(s): CKTOTAL, CKMB, CKMBINDEX, TROPONINI in the last 72 hours.  BNP: BNP (last 3 results) Recent Labs    02/25/19 0919 05/20/19 0917 07/16/19 1223  BNP 135.4* 104.2* 125.2*    ProBNP (last 3 results) No results for input(s): PROBNP in the last 8760 hours.   D-Dimer No results for input(s): DDIMER in the last 72 hours. Hemoglobin A1C No results for input(s): HGBA1C in the last 72 hours. Fasting Lipid Panel No results for input(s): CHOL, HDL, LDLCALC, TRIG, CHOLHDL, LDLDIRECT in the last 72 hours. Thyroid Function Tests No results for input(s): TSH, T4TOTAL, T3FREE, THYROIDAB in the last 72 hours.  Invalid input(s): FREET3  Other results:   Imaging    Ct Head Wo Contrast  Result Date: 10/21/2019 CLINICAL DATA:  Seizure EXAM: CT HEAD WITHOUT CONTRAST TECHNIQUE: Contiguous axial images were obtained from the base of the skull through the vertex without intravenous contrast. COMPARISON:  12/20/2017 FINDINGS: Brain: There is atrophy and chronic small vessel disease changes. No acute intracranial abnormality. Specifically, no  hemorrhage, hydrocephalus, mass lesion, acute infarction, or significant intracranial injury. Vascular: No hyperdense vessel or unexpected calcification. Skull: No acute calvarial abnormality. Sinuses/Orbits: Visualized paranasal sinuses and mastoids clear. Orbital soft tissues unremarkable. Other: None IMPRESSION: Atrophy, chronic microvascular disease. No acute intracranial abnormality. Electronically Signed   By: Charlett Nose M.D.   On: 10/21/2019 12:50   Dg Chest Port 1 View  Result Date: 10/21/2019 CLINICAL DATA:  Chest pain EXAM: PORTABLE CHEST 1 VIEW COMPARISON:  08/30/2019 FINDINGS: Status post median sternotomy and CABG with left  chest single lead pacer defibrillator. Unchanged elevation of the left hemidiaphragm without acute airspace opacity. The visualized skeletal structures are unremarkable. IMPRESSION: Unchanged elevation of the left hemidiaphragm without acute abnormality of the lungs in AP portable projection. Electronically Signed   By: Lauralyn Primes M.D.   On: 10/21/2019 11:03      Medications:     Scheduled Medications: . atorvastatin  80 mg Oral q1800  . enoxaparin (LOVENOX) injection  40 mg Subcutaneous Q24H  . levETIRAcetam  500 mg Oral BID  . sodium chloride flush  3 mL Intravenous Q12H     Infusions: . sodium chloride       PRN Medications:  sodium chloride, acetaminophen, ondansetron (ZOFRAN) IV, sodium chloride flush     Assessment/Plan  1. New Onset Seizure  Witnessed x 2.  CT of head negative  - EEG.  - MRI Brain ordered today.  - Neurology consulted. Started on keppra  - Instructed he cant drive. He doesn't have a car or drive. Uses [facility] to get to appointments.   2. Possible PEA arrest CPR initiated with ROSC.  HS Trop 10>12 ICD interrogated. No arrhythmias noted.  3. Chronic systolic CHF with ICM - TTE 11/30/17 25-30% RV normal. Echo 03/2018: EF40-45%RV normal.  - ECHO 04/2019  EF ~ 25%. [employer] ICD - He does not appear volume overloaded.  Hold lasix, entresto,  - Restart spiro 12.5 mg daily.  - Restart coreg 3.125 mg twice a day.   4. 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  5 VF arrest in setting of STEMI - now has ICD. No shocks.  6. . ETOH abuse   Ambulate.    Length of Stay: 1  Amy Clegg, NP  10/22/2019, 9:44 AM  Advanced Heart Failure Team Pager [phone] (M-F; 7a - 4p)  Please contact [facility] for night-coverage after hours (4p -7a ) and weekends on [email or web address]  Patient seen and examined with the above-signed Advanced Practice Provider and/or Housestaff. I personally reviewed laboratory data, imaging studies  and relevant notes. I independently examined the patient and formulated the important aspects of the plan. I have edited the note to reflect any of my changes or salient points. I have personally discussed the plan with the patient and/or family.  No further seizures on Keppra. Rhythm stable on tele. Brain MRI shows chronic lacunar infarct within the left cerebellum is new from prior MRI 08/18/2015. There is a background of mild chronic small vessel ischemic disease.Stable generalized parenchymal atrophy, markedly advanced for Age.HF stable agree with adding spiro. Hopefully can get him home in am. Stressed need to avoid ETOH.  Arvilla Meres, MD  6:54 PM

## 2019-10-23 ENCOUNTER — Other Ambulatory Visit (HOSPITAL_COMMUNITY): Payer: Self-pay

## 2019-10-23 DIAGNOSIS — R569 Unspecified convulsions: Secondary | ICD-10-CM | POA: Diagnosis not present

## 2019-10-23 DIAGNOSIS — I5023 Acute on chronic systolic (congestive) heart failure: Secondary | ICD-10-CM

## 2019-10-23 LAB — BASIC METABOLIC PANEL
Anion gap: 10 (ref 5–15)
BUN: 11 mg/dL (ref 6–20)
CO2: 25 mmol/L (ref 22–32)
Calcium: 9 mg/dL (ref 8.9–10.3)
Chloride: 103 mmol/L (ref 98–111)
Creatinine, Ser: 1.08 mg/dL (ref 0.61–1.24)
GFR calc Af Amer: >60 mL/min (ref >60)
GFR calc non Af Amer: >60 mL/min (ref >60)
Glucose, Bld: 100 mg/dL — ABNORMAL HIGH (ref 70–99)
Potassium: 3.9 mmol/L (ref 3.5–5.1)
Sodium: 138 mmol/L (ref 135–145)

## 2019-10-23 LAB — TROPONIN I (HIGH SENSITIVITY): Troponin I (High Sensitivity): 10 ng/L (ref <18)

## 2019-10-23 MED ORDER — POTASSIUM CHLORIDE CRYS ER 20 MEQ PO TBCR
40.0000 meq | EXTENDED_RELEASE_TABLET | Freq: Once | ORAL | Status: AC
  Administered 2019-10-23: 40 meq via ORAL
  Filled 2019-10-23: qty 2

## 2019-10-23 MED ORDER — FUROSEMIDE 10 MG/ML IJ SOLN
40.0000 mg | Freq: Once | INTRAMUSCULAR | Status: AC
  Administered 2019-10-23: 40 mg via INTRAVENOUS
  Filled 2019-10-23: qty 4

## 2019-10-23 MED ORDER — SPIRONOLACTONE 25 MG PO TABS: 12.5000 mg | ORAL_TABLET | Freq: Every day | ORAL | 6 refills | Status: DC

## 2019-10-23 MED ORDER — SACUBITRIL-VALSARTAN 24-26 MG PO TABS
1.0000 | ORAL_TABLET | Freq: Two times a day (BID) | ORAL | Status: DC
  Administered 2019-10-23: 1 via ORAL
  Filled 2019-10-23: qty 1

## 2019-10-23 MED ORDER — SACUBITRIL-VALSARTAN 24-26 MG PO TABS: 1.0000 | ORAL_TABLET | Freq: Two times a day (BID) | ORAL | 3 refills | Status: DC

## 2019-10-23 MED ORDER — FUROSEMIDE 40 MG PO TABS: 40.0000 mg | ORAL_TABLET | Freq: Every day | ORAL | Status: DC

## 2019-10-23 MED ORDER — LEVETIRACETAM 500 MG PO TABS: 500.0000 mg | ORAL_TABLET | Freq: Two times a day (BID) | ORAL | 6 refills | Status: DC

## 2019-10-23 MED FILL — SPIRONOLACTONE 25 MG TABLET: 25 | 60 days supply | Qty: 30 | Fill #0

## 2019-10-23 MED FILL — ENTRESTO 24 MG-26 MG TABLET: 24-26 | 30 days supply | Qty: 60 | Fill #0

## 2019-10-23 MED FILL — levETIRAcetam 500 MG TABS: 500 | 30 days supply | Qty: 60 | Fill #0

## 2019-10-23 NOTE — Progress Notes (Signed)
NEUROLOGY PROGRESS NOTE  Subjective: Patient has no complaints other than some chest discomfort when palpated  Exam: Vitals:   10/22/19 2045 10/23/19 0439  BP: 114/67 116/79  Pulse: 87 86  Resp:    Temp: 98 F (36.7 C) (!) 97.5 F (36.4 C)  SpO2: 92% 95%    ROS General ROS: negative for - chills, fatigue, fever, night sweats, weight gain or weight loss Psychological ROS: negative for - behavioral disorder, hallucinations, memory difficulties, mood swings or suicidal ideation Ophthalmic ROS: negative for - blurry vision, double vision, eye pain or loss of vision ENT ROS: negative for - epistaxis, nasal discharge, oral lesions, sore throat, tinnitus or vertigo Respiratory ROS: negative for - cough, hemoptysis, shortness of breath or wheezing Cardiovascular ROS: negative for - chest pain, dyspnea on exertion, edema or irregular heartbeat Gastrointestinal ROS: negative for - abdominal pain, diarrhea, hematemesis, nausea/vomiting or stool incontinence Genito-Urinary ROS: negative for - dysuria, hematuria, incontinence or urinary frequency/urgency Musculoskeletal ROS: Positive for -chest discomfort to palpation Neurological ROS: as noted in HPI Dermatological ROS: negative for rash and skin lesion changes      Neuro:   Physical Exam  Constitutional: Appears well-developed and well-nourished.  Psych: Affect appropriate to situation Eyes: No scleral injection HENT: No OP obstrucion Head: Normocephalic.  Cardiovascular: Normal rate and regular rhythm.  Respiratory: Effort normal, non-labored breathing GI: Soft.  No distension. There is no tenderness.  Skin: WDI    Mental Status: Alert, oriented, thought content appropriate.  Speech fluent without evidence of aphasia.  Able to follow 3 step commands without difficulty. Cranial Nerves: II:  Visual fields grossly normal,  III,IV, VI: ptosis not present, extra-ocular motions intact bilaterally pupils equal, round, reactive to  light and accommodation V,VII: smile symmetric, facial light touch sensation normal bilaterally VIII: hearing normal bilaterally Motor: Right : Upper extremity   5/5    Left:     Upper extremity   5/5  Lower extremity   5/5     Lower extremity   5/5 Tone and bulk:normal tone throughout; no atrophy noted Sensory:  light touch intact throughout, bilaterally Deep Tendon Reflexes: 2+ and symmetric throughout Plantars: Right: downgoing   Left: downgoing     Medications:  Scheduled: . atorvastatin  80 mg Oral q1800  . carvedilol  3.125 mg Oral BID WC  . enoxaparin (LOVENOX) injection  40 mg Subcutaneous Q24H  . furosemide  40 mg Intravenous Once  . furosemide  40 mg Oral Daily  . levETIRAcetam  500 mg Oral BID  . potassium chloride  40 mEq Oral Once  . sacubitril-valsartan  1 tablet Oral BID  . sodium chloride flush  3 mL Intravenous Q12H  . spironolactone  12.5 mg Oral Daily   Continuous: . sodium chloride     ZTI:WPYKDX chloride, acetaminophen, ondansetron (ZOFRAN) IV, sodium chloride flush  Pertinent Labs/Diagnostics:   Ct Head Wo Contrast  Result Date: 10/21/2019  IMPRESSION: Atrophy, chronic microvascular disease. No acute intracranial abnormality. Electronically Signed   By: Charlett Nose M.D.   On: 10/21/2019 12:50   Mr [name] IP Contrast  Result Date: 10/22/2019  IMPRESSION: 1. Motion degraded examination. 2. No evidence of acute intracranial abnormality. 3. No specific cause of seizure is identified. 4. Redemonstrated chronic bilateral basal ganglia lacunar infarcts. A chronic lacunar infarct within the left cerebellum is new from prior MRI 08/18/2015. There is a background of mild chronic small vessel ischemic disease. 5. Stable generalized parenchymal atrophy, markedly advanced for age. Electronically  Signed   By: Kellie Simmering DO   On: 10/22/2019 14:57       Etta Quill PA-C [facility] [phone]   Assessment: 58 year old male with focal  seizure and secondary generalization.  Patient has had no further seizures while on Keppra.  Although MRI shows no evidence of acute intracranial abnormality.    Recommendations: -Continue Keppra 500 mg twice daily -As stated request for follow-up with outpatient neurology has already been made -Per [facility] statutes, patients with seizures are not allowed to drive until  they have been seizure-free for six months. Use caution when using heavy equipment or power tools. Avoid working on ladders or at heights. Take showers instead of baths. Ensure the water temperature is not too high on the home water heater. Do not go swimming alone. When caring for infants or small children, sit down when holding, feeding, or changing them to minimize risk of injury to the child in the event you have a seizure.   Also, Maintain good sleep hygiene. Avoid alcohol.   At this time neurology will sign off.  Please call with any questions   10/23/2019, 8:11 AM

## 2019-10-23 NOTE — Progress Notes (Signed)
CSW following patient through outpatient [facility].  CSW met with pt to check in.  Pt reports doing much better and anticipates discharging today- confirms he would not have a ride home- CSW informed inpatient [facility] team.  CSW confirmed with [place] that pt is not due for a refill on any of his outpatient medications so he should be ok until late next week- CSW to assist in ordering refills next week.  CSW will continue to follow in outpatient setting and assist as needed- will follow up with patient once discharged home.  [name], LCSW Clinical Social Worker Advanced Heart Failure Clinic Desk#: [phone] Cell#: [phone]

## 2019-10-23 NOTE — Discharge Summary (Addendum)
Advanced Heart Failure Team  Discharge Summary   Patient ID: [name] MRN: [id number], DOB/AGE: 07-01-1961 58 y.o. Admit date: 10/21/2019 D/C date:     10/23/2019   Primary Discharge Diagnoses:  1. New Onset Seizure  2. Possible PEAarrest 3.Chronic systolic CHF with ICM 4. 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 ETOH abuse  Hospital Course:  [name] a 58 y.o.malewith h/o ETOH, VF Arrest, CAD s/p CABG 12/07/17, and ICM with EF 25-30%.  Admitted 12/6 - 12/14/17 with V-Fib arrest while chopping wood. Received CPR in the field. R/LHC with severe 3vD as below and cardiogenic shock requiring IABP. Required milrinone with continued shock. Pt improved and IABP removed 12/10, but pt had recurrent VF arrest so IABP replaced. Stabilized and underwent CABG 12/07/17. Tolerated gradual wean of meds and extubation with no furthe events. HF medications titrated as tolerated.  Admitted 12/27 ->12/28 with syncope and AKI thought to be 2/2 volume depletion on lasix, spiro, and Entresto. CT negative for ICH. Improved rapidly with holding meds and gentle IVF. Entresto stopped. Spiro cut back and switched to low dose losartan.  After HF meds optimized, ECHO repeated and showed persistently low EF. Referred to EP for ICD. On 08/29/19 he underwent  Single chamber ICD with [employer].  Admitted from the HF clinic after a witnessed seizure  with loss of pulse. CPR started with quick ROSC. He did not require shock. Taken to the ED and had another seizure. Neurology consulted. Started on Keppra.   From HF perspective he had mild volume overload and had a dose of IV lasix. See below for detailed problems list. He will contine to be followed closely in the HF clinic.   1. New Onset Seizure  Witnessed x 2.  CT of head negative  - MRI Brain shows chronic lacunar infarct within the left cerebellum is new from prior MRI 08/18/2015. There is a background of  mild chronic small vessel ischemic disease.Stable generalized parenchymal atrophy, markedly advanced for age - Neurology consulted. Started on keppra. Plan to continue keppra 500 mg twice a day.  He has follow up with Neuro.  - Instructed he cant drive. He doesn't have a car or drive. Uses [facility] to get to appointments.   2. Possible PEAarrest CPR initiated with ROSC.  HS Trop 10>12 ICD interrogated. No arrhythmias noted.  3.Acute/Chronic systolic CHF with ICM - TTE 11/30/17 25-30% RV normal. Echo 03/2018: EF40-45%RV normal.  - ECHO 5/2020EF ~ 25%. Has [employer] ICD -Mild volume overload. Diuresed with IV lasix and transitioned back to lasix 40 mg daily.  - Home HF meds restarted with addition of spironolactone.   4. 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  5VF arrest in setting of STEMI - now has ICD for secondary prevention. No shocks.  6. ETOH abuse - Discussed at length the improtance of alcohol cessation.    Discharge Vitals: Blood pressure 116/79, pulse 86, temperature (!) 97.5 F (36.4 C), temperature source Oral, resp. rate 19, height 5\' 9"  (1.753 m), weight 68.1 kg, SpO2 95 %.  Labs: Lab Results  Component Value Date   WBC 6.1 10/21/2019   HGB 13.2 10/21/2019   HCT 39.0 10/21/2019   MCV 99.5 10/21/2019   PLT 144 (L) 10/21/2019    Recent Labs  Lab 10/23/19 0414  NA 138  K 3.9  CL 103  CO2 25  BUN 11  CREATININE 1.08  CALCIUM 9.0  GLUCOSE 100*   Lab  Results  Component Value Date   TRIG 101 11/29/2017   BNP (last 3 results) Recent Labs    02/25/19 0919 05/20/19 0917 07/16/19 1223  BNP 135.4* 104.2* 125.2*    ProBNP (last 3 results) No results for input(s): PROBNP in the last 8760 hours.   Diagnostic Studies/Procedures   Ct Head Wo Contrast  Result Date: 10/21/2019 CLINICAL DATA:  Seizure EXAM: CT HEAD WITHOUT CONTRAST TECHNIQUE: Contiguous axial images were obtained from the base of the skull through  the vertex without intravenous contrast. COMPARISON:  12/20/2017 FINDINGS: Brain: There is atrophy and chronic small vessel disease changes. No acute intracranial abnormality. Specifically, no hemorrhage, hydrocephalus, mass lesion, acute infarction, or significant intracranial injury. Vascular: No hyperdense vessel or unexpected calcification. Skull: No acute calvarial abnormality. Sinuses/Orbits: Visualized paranasal sinuses and mastoids clear. Orbital soft tissues unremarkable. Other: None IMPRESSION: Atrophy, chronic microvascular disease. No acute intracranial abnormality. Electronically Signed   By: Charlett Nose M.D.   On: 10/21/2019 12:50   Mr Laqueta Jean NO Contrast  Result Date: 10/22/2019 CLINICAL DATA:  Seizure, new, nontraumatic, >40 years. EXAM: MRI HEAD WITHOUT AND WITH CONTRAST TECHNIQUE: Multiplanar, multiecho pulse sequences of the brain and surrounding structures were obtained without and with intravenous contrast. CONTRAST:  37mL GADAVIST GADOBUTROL 1 MMOL/ML IV SOLN COMPARISON:  Head CT 10/21/2019, brain MRI 08/18/2015 FINDINGS: Brain: Moderate motion degradation of the coronal T2 weighted FLAIR sequence oriented perpendicular to the long axis the hippocampal formations. Mild motion degradation of multiple digital sequences. There is no convincing evidence of acute infarct. No evidence of intracranial mass. No midline shift or extra-axial fluid collection. No chronic intracranial blood products. Mild scattered T2/FLAIR hyperintensity within the cerebral white matter is nonspecific, but consistent with chronic small vessel ischemic disease. Redemonstrated chronic lacunar infarcts within the bilateral basal ganglia. This includes a chronic lacunar infarct within the right caudate head. A chronic lacunar infarct within the left cerebellum is new from prior MRI 08/18/2015. As before, there is symmetric atrophy of the mesial temporal lobes. Stable generalized parenchymal atrophy, again markedly  advanced for patient age. No abnormal intracranial enhancement is identified. Vascular: Flow voids maintained within the proximal large arterial vessels. Skull and upper cervical spine: No focal marrow lesion. Sinuses/Orbits: Visualized orbits demonstrate no acute abnormality. Mild ethmoid sinus mucosal thickening. Trace fluid within left mastoid air cells. IMPRESSION: 1. Motion degraded examination. 2. No evidence of acute intracranial abnormality. 3. No specific cause of seizure is identified. 4. Redemonstrated chronic bilateral basal ganglia lacunar infarcts. A chronic lacunar infarct within the left cerebellum is new from prior MRI 08/18/2015. There is a background of mild chronic small vessel ischemic disease. 5. Stable generalized parenchymal atrophy, markedly advanced for age. Electronically Signed   By: Jackey Loge DO   On: 10/22/2019 14:57    Discharge Medications   Allergies as of 10/23/2019   No Known Allergies     Medication List    TAKE these medications   atorvastatin 80 MG tablet Commonly known as: LIPITOR Take 1 tablet (80 mg total) by mouth daily at 6 PM.   carvedilol 6.25 MG tablet Commonly known as: COREG Take 1 tablet (6.25 mg total) by mouth 2 (two) times daily with a meal.   furosemide 40 MG tablet Commonly known as: LASIX Take 1 tablet (40 mg total) by mouth daily. May take extra 40mg  daily as needed for swelling Start taking on: October 24, 2019   levETIRAcetam 500 MG tablet Commonly known as: KEPPRA Take 1 tablet (  500 mg total) by mouth 2 (two) times daily.   potassium chloride SA 20 MEQ tablet Commonly known as: KLOR-CON Take 1 tablet (20 mEq total) by mouth daily. Take 1 tab when you take a dose of lasix as needed   sacubitril-valsartan 24-26 MG Commonly known as: ENTRESTO Take 1 tablet by mouth 2 (two) times daily.   spironolactone 25 MG tablet Commonly known as: ALDACTONE Take 0.5 tablets (12.5 mg total) by mouth daily. Start taking on: October 24, 2019       Disposition   The patient will be discharged in stable condition to home. Discharge Instructions    (HEART FAILURE PATIENTS) Call MD:  Anytime you have any of the following symptoms: 1) 3 pound weight gain in 24 hours or 5 pounds in 1 week 2) shortness of breath, with or without a dry hacking cough 3) swelling in the hands, feet or stomach 4) if you have to sleep on extra pillows at night in order to breathe.   Complete by: As directed    Ambulatory referral to Neurology   Complete by: As directed    An appointment is requested in approximately: 2 weeks   Diet - low sodium heart healthy   Complete by: As directed    Heart Failure patients record your daily weight using the same scale at the same time of day   Complete by: As directed    Increase activity slowly   Complete by: As directed      Follow-up Information    [facility] Follow up on 10/30/2019.   Specialty: Cardiology Why: at 1000. Garage Code [id number]  Contact information: [address] [id number] [place] [id number] [phone]            Duration of Discharge Encounter: Greater than 35 minutes   Signed, Tonye Becketmy Clegg  10/23/2019, 11:10 AM   Agree with above. Ok for d/c today on Keppra. Counseled on need to avoid ETOH.   Arvilla Meresaniel [name], MD  4:15 PM

## 2019-10-23 NOTE — Progress Notes (Addendum)
Advanced Heart Failure Rounding Note  PCP-Cardiologist: Dr. [name]   Subjective:    Doing ok this AM.  Stable on Keppra. No further seizures. No cardiac complaints.    Objective:   Weight Range: 68.1 kg Body mass index is 22.17 kg/m.   Vital Signs:   Temp:  [97.5 F (36.4 C)-98 F (36.7 C)] 97.5 F (36.4 C) (10/29 0439) Pulse Rate:  [86-96] 86 (10/29 0439) BP: (111-120)/(67-85) 116/79 (10/29 0439) SpO2:  [92 %-96 %] 95 % (10/29 0439) Weight:  [68.1 kg] 68.1 kg (10/29 0439) Last BM Date: 10/20/19  Weight change: Filed Weights   10/21/19 1317 10/22/19 0443 10/23/19 0439  Weight: 63.5 kg 66.2 kg 68.1 kg    Intake/Output:   Intake/Output Summary (Last 24 hours) at 10/23/2019 0713 Last data filed at 10/23/2019 0300 Gross per 24 hour  Intake 960 ml  Output 850 ml  Net 110 ml      Physical Exam    General:  Well appearing. No resp difficulty HEENT: Normal Neck: Supple. JVP flat  . Carotids 2+ bilat; no bruits. No lymphadenopathy or thyromegaly appreciated. Cor: PMI nondisplaced. Regular rate & rhythm. No rubs, gallops or murmurs. Lungs: Clear Abdomen: Soft, nontender, nondistended. No hepatosplenomegaly. No bruits or masses. Good bowel sounds. Extremities: No cyanosis, clubbing, rash, edema Neuro: Alert & orientedx3, cranial nerves grossly intact. moves all 4 extremities w/o difficulty. Affect pleasant   Telemetry   SR/ST 80-100s   EKG    N/A  Labs    CBC Recent Labs    10/21/19 1045 10/21/19 1327  WBC 8.7 6.1  HGB 14.1 13.2  HCT 43.1 39.0  MCV 103.9* 99.5  PLT 184 144*   Basic Metabolic Panel Recent Labs    16/09/9609/28/20 0422 10/23/19 0414  NA 139 138  K 3.8 3.9  CL 103 103  CO2 25 25  GLUCOSE 95 100*  BUN 12 11  CREATININE 1.24 1.08  CALCIUM 9.2 9.0   Liver Function Tests No results for input(s): AST, ALT, ALKPHOS, BILITOT, PROT, ALBUMIN in the last 72 hours. No results for input(s): LIPASE, AMYLASE in the last 72  hours. Cardiac Enzymes No results for input(s): CKTOTAL, CKMB, CKMBINDEX, TROPONINI in the last 72 hours.  BNP: BNP (last 3 results) Recent Labs    02/25/19 0919 05/20/19 0917 07/16/19 1223  BNP 135.4* 104.2* 125.2*    ProBNP (last 3 results) No results for input(s): PROBNP in the last 8760 hours.   D-Dimer No results for input(s): DDIMER in the last 72 hours. Hemoglobin A1C No results for input(s): HGBA1C in the last 72 hours. Fasting Lipid Panel No results for input(s): CHOL, HDL, LDLCALC, TRIG, CHOLHDL, LDLDIRECT in the last 72 hours. Thyroid Function Tests No results for input(s): TSH, T4TOTAL, T3FREE, THYROIDAB in the last 72 hours.  Invalid input(s): FREET3  Other results:   Imaging    Mr Laqueta JeanBrain W Wo Contrast  Result Date: 10/22/2019 CLINICAL DATA:  Seizure, new, nontraumatic, >40 years. EXAM: MRI HEAD WITHOUT AND WITH CONTRAST TECHNIQUE: Multiplanar, multiecho pulse sequences of the brain and surrounding structures were obtained without and with intravenous contrast. CONTRAST:  7mL GADAVIST GADOBUTROL 1 MMOL/ML IV SOLN COMPARISON:  Head CT 10/21/2019, brain MRI 08/18/2015 FINDINGS: Brain: Moderate motion degradation of the coronal T2 weighted FLAIR sequence oriented perpendicular to the long axis the hippocampal formations. Mild motion degradation of multiple digital sequences. There is no convincing evidence of acute infarct. No evidence of intracranial mass. No midline shift or extra-axial  fluid collection. No chronic intracranial blood products. Mild scattered T2/FLAIR hyperintensity within the cerebral white matter is nonspecific, but consistent with chronic small vessel ischemic disease. Redemonstrated chronic lacunar infarcts within the bilateral basal ganglia. This includes a chronic lacunar infarct within the right caudate head. A chronic lacunar infarct within the left cerebellum is new from prior MRI 08/18/2015. As before, there is symmetric atrophy of the mesial  temporal lobes. Stable generalized parenchymal atrophy, again markedly advanced for patient age. No abnormal intracranial enhancement is identified. Vascular: Flow voids maintained within the proximal large arterial vessels. Skull and upper cervical spine: No focal marrow lesion. Sinuses/Orbits: Visualized orbits demonstrate no acute abnormality. Mild ethmoid sinus mucosal thickening. Trace fluid within left mastoid air cells. IMPRESSION: 1. Motion degraded examination. 2. No evidence of acute intracranial abnormality. 3. No specific cause of seizure is identified. 4. Redemonstrated chronic bilateral basal ganglia lacunar infarcts. A chronic lacunar infarct within the left cerebellum is new from prior MRI 08/18/2015. There is a background of mild chronic small vessel ischemic disease. 5. Stable generalized parenchymal atrophy, markedly advanced for age. Electronically Signed   By: Jackey Loge DO   On: 10/22/2019 14:57     Medications:     Scheduled Medications:  atorvastatin  80 mg Oral q1800   carvedilol  3.125 mg Oral BID WC   enoxaparin (LOVENOX) injection  40 mg Subcutaneous Q24H   levETIRAcetam  500 mg Oral BID   sodium chloride flush  3 mL Intravenous Q12H   spironolactone  12.5 mg Oral Daily    Infusions:  sodium chloride      PRN Medications: sodium chloride, acetaminophen, ondansetron (ZOFRAN) IV, sodium chloride flush     Assessment/Plan   1. New Onset Seizure  Witnessed x 2.  CT of head negative  - EEG.  - MRI Brain shows chronic lacunar infarct within the left cerebellum is new from prior MRI 08/18/2015. There is a background of mild chronic small vessel ischemic disease.Stable generalized parenchymal atrophy, markedly advanced for Age - Neurology consulted. Started on keppra  - Instructed he cant drive. He doesn't have a car or drive. Uses [facility] to get to appointments.   2. Possible PEA arrest CPR initiated with ROSC.  HS Trop 10>12 ICD interrogated. No  arrhythmias noted.  3. Chronic systolic CHF with ICM - TTE 11/30/17 25-30% RV normal. Echo 03/2018: EF40-45%RV normal.  - ECHO 04/2019  EF ~ 25%. Has [employer] ICD - Wt appears to be creping up, 140>>145>>150. Entresto and Lasix both held on admit. SCr and BP stable.  - Plan to restart Entresto today, low dose 24-26. - Restart Lasix 40 mg daily.  - Continue spiro 12.5 mg daily.  - Continue coreg 3.125 mg twice a day.   4. 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  5 VF arrest in setting of STEMI - now has ICD for secondary prevention. No shocks.  6.  ETOH abuse  - drinks ~ 6 beers a day   Dispo: possible d/c home today if cleared by neurology.   Length of Stay: 2  Robbie Lis, PA-C  10/23/2019, 7:13 AM  Advanced Heart Failure Team Pager [phone] (M-F; 7a - 4p)  Please contact [facility] for night-coverage after hours (4p -7a ) and weekends on [email or web address]  Patient seen and examined with the above-signed Advanced Practice Provider and/or Housestaff. I personally reviewed laboratory data, imaging studies and relevant notes. I independently examined the patient and formulated the important aspects of the plan. I have  edited the note to reflect any of my changes or salient points. I have personally discussed the plan with the patient and/or family.  No further seizures. Rhythm stable. Weight is up. Wil give 1 dose IV lasix today and then plan d/c as above later today. I have spoken with Neuro yesterday and they feel he is stable for d/c on Keppra. Stressed need to avoid ETO.   Glori Bickers, MD  8:02 AM

## 2019-10-23 NOTE — Discharge Instructions (Signed)
 Seizure, Adult A seizure is a sudden burst of abnormal electrical activity in the brain. Seizures usually last from 30 seconds to 2 minutes. The abnormal activity temporarily interrupts normal brain function. A seizure can cause many different symptoms depending on where in the brain it starts. What are the causes? Common causes of this condition include:  Fever or infection.  Brain abnormality, injury, bleeding, or tumor.  Low blood sugar.  Metabolic disorders or other conditions that are passed from parent to child (are inherited).  Reaction to a substance, such as a drug or a medicine, or suddenly stopping the use of a substance (withdrawal).  Stroke.  Developmental disorders such as autism or cerebral palsy. In some cases, the cause of this condition may not be known. Some people who have a seizure never have another one. Seizures usually do not cause brain damage or permanent problems unless they are prolonged. A person who has repeated seizures over time without a clear cause has a condition called epilepsy. What increases the risk? You are more likely to develop this condition if you have:  A family history of epilepsy.  Had a tonic-clonic seizure in the past. This is a type of seizure that involves whole-body contraction of muscles and a loss of consciousness.  Autism, cerebral palsy, or other brain disorders.  A history of head trauma, lack of oxygen at birth, or strokes. What are the signs or symptoms? There are many different types of seizures. The symptoms of a seizure vary depending on the type of seizure you have. Examples of symptoms during a seizure include:  Uncontrollable shaking (convulsions).  Stiffening of the body.  Loss of consciousness.  Head nodding.  Staring.  Not responding to sound or touch.  Loss of bladder or bowel control. Some people have symptoms right before a seizure happens (aura) and right after a seizure happens (postictal).  Symptoms before a seizure may include:  Fear or anxiety.  Nausea.  Feeling like the room is spinning (vertigo).  A feeling of having seen or heard something before (dj vu).  Odd tastes or smells.  Changes in vision, such as seeing flashing lights or spots. Symptoms after a seizure may include:  Confusion.  Sleepiness.  Headache.  Weakness on one side of the body. How is this diagnosed? This condition may be diagnosed based on:  A description of your symptoms. Video of your seizures can be helpful.  Your medical history.  A physical exam. You may also have tests, including:  Blood tests.  CT scan.  MRI.  Electroencephalogram (EEG). This test measures electrical activity in the brain. An EEG can predict whether seizures will return (recur).  A spinal tap (also called a lumbar puncture). This is the removal and testing of fluid that surrounds the brain and spinal cord. How is this treated? Most seizures will stop on their own in under 5 minutes, and no treatment is needed. Seizures that last longer than 5 minutes will usually need treatment. Treatment can include:  Medicines given through an IV.  Avoiding known triggers, such as medicines that you take for another condition.  Medicines to treat epilepsy (antiepileptics), if epilepsy caused your seizures.  Surgery to stop seizures, if you have epilepsy that does not respond to medicines. Follow these instructions at home: Medicines  Take over-the-counter and prescription medicines only as told by your health care provider.  Avoid any substances that may prevent your medicine from working properly, such as alcohol. Activity  Do not   swim, or do any other activities that would be dangerous if you had another seizure. Wait until your health care provider says it is safe to do them.  If you live in the [place] check with your local DMV (department of motor vehicles) to find out about local driving laws. Each state  has specific rules about when you can legally return to driving.  Get enough rest. Lack of sleep can make seizures more likely to occur. Educating others Teach friends and family what to do if you have a seizure. They should:  Lay you on the ground to prevent a fall.  Cushion your head and body.  Loosen any tight clothing around your neck.  Turn you on your side. If vomiting occurs, this helps keep your airway clear.  Not hold you down. Holding you down will not stop the seizure.  Not put anything into your mouth.  Know whether or not you need emergency care. For example, they should get help right away if you have a seizure that lasts longer than 5 minutes or have several seizures in a row.  Stay with you until you recover.  General instructions  Contact your health care provider each time you have a seizure.  Avoid anything that has ever triggered a seizure for you.  Keep a seizure diary. Record what you remember about each seizure, especially anything that might have triggered the seizure.  Keep all follow-up visits as told by your health care provider. This is important. Contact a health care provider if:  You have another seizure.  You have seizures more often.  Your seizure symptoms change.  You continue to have seizures with treatment.  You have symptoms of an infection or illness. This might increase your risk of having a seizure. Get help right away if:  You have a seizure that: ? Lasts longer than 5 minutes. ? Is different than previous seizures. ? Leaves you unable to speak or use a part of your body. ? Makes it harder to breathe.  You have: ? A seizure after a head injury. ? Multiple seizures in a row. ? Confusion or a severe headache right after a seizure.  You do not wake up immediately after a seizure.  You injure yourself during a seizure. These symptoms may represent a serious problem that is an emergency. Do not wait to see if the symptoms  will go away. Get medical help right away. Call your local emergency services (911 in the [place] Do not drive yourself to the hospital. Summary  Seizures are caused by abnormal electrical activity in the brain. The activity disrupts normal brain function and can cause various symptoms, such as convulsions, abnormal movements, or a change in consciousness.  There are many causes of seizures, including illnesses, medicines, genetic conditions, head injuries, strokes, tumors, substance abuse, or substance withdrawal.  Most seizures will stop on their own in under 5 minutes. Seizures that last longer than 5 minutes are a medical emergency and require immediate treatment.  Many medicines are used to treat seizures. Take over-the-counter and prescription medicines only as told by your health care provider. This information is not intended to replace advice given to you by your health care provider. Make sure you discuss any questions you have with your health care provider. Document Released: 12/08/2000 Document Revised: 02/28/2019 Document Reviewed: 02/28/2019 Elsevier Patient Education  2020 Elsevier Inc.   Managing Non-Epileptic Seizures, Adult Epileptic seizures are caused by abnormal electrical activity in the brain, but not  all seizures are caused by epilepsy. Seizures that are not caused by epilepsy are called non-epileptic seizures, and there are two types:  Physiologic non-epileptic seizure. This is also called provoked seizure or organic seizure. This type of seizure stops when the cause goes away or is treated. Possible causes include: ? High fever. ? High or low blood sugar (glucose). ? Brain injury. ? Brain infection.  Psychogenic non-epileptic seizure (PNES). This can be caused by a mental disturbance (psychological distress), not by abnormal brain activity or brain injury. This may look similar to other types of seizures. A PNES may be called an "event" or "attack" instead of a  seizure. Possible causes include: ? Stress. ? Major life events, such as divorce or death of a loved one. ? Post-traumatic stress disorder (PTSD). ? Physical or sexual abuse. ? Mental health disorders, including anxiety and depression. General treatment recommendations Physiologic non-epileptic seizure  If you have physiologic non-epileptic seizures, your health care provider will treat the cause. These seizures are not likely to return, and they do not need further treatment. PNES  Your health care provider may suspect PNES if: ? You have seizures that keep coming back (recurring) and do not respond to seizure medicines. ? You do not show any abnormal brain activity during electrical brain activity testing (electroencephalogram, or EEG).  It is very important to understand that PNES is a real illness. It does not mean that the seizure is fake. It just means that the cause is different. PNES can be treated.  You may be referred to a mental health specialist (psychiatrist). This is because PNES is a mental health disorder. Mental health treatment may include: ? Talk therapy (cognitive behavioral therapy, or CBT). This is the most effective treatment. Through CBT, you will learn to identify and manage the psychological distress that leads to seizures. ? Stress reduction and relaxation techniques. ? Family therapy. ? Medicines to treat depression or anxiety.  In many cases, knowing that seizures are not caused by epilepsy reduces or stops seizures. How to manage lifestyle changes Managing stress     Certain types of counseling can be very helpful for managing stress. A mental health professional can assess what other treatments may also help you, such as:  Cognitive behavioral therapy.  Medicine to treat depression or anxiety.  Biofeedback. This uses signals from your body (physiological responses) to help you learn to regulate anxiety.  Meditation.  Yoga. Consider self-care  strategies to lower stress levels, such as:  Talking with a trusted friend or family member about your thoughts and feelings.  Muscle relaxation and breathing exercises.  Trying activities to relieve stress, such as: ? Deep breathing. ? Listening to music. ? Exercising or taking a walk. ? Writing in a journal. ? Sales promotion account executiveCreating artwork.  Relationships Make sure family members, friends, and co-workers are trained in how to help you if you have a seizure. If you have a seizure, people near you should:  Lay you on the ground to prevent a fall.  Place a pillow or piece of clothing under your head.  Loosen any tight clothing, especially around your neck.  Turn you onto your side. This helps keep your airway clear if you vomit. Make sure people do not try to hold you down, keep you still, or put anything in your mouth during a seizure. Follow these instructions at home: Medicines Medicines may be prescribed to treat depression or anxiety that causes non-epileptic seizures. Avoid using alcohol and other substances that may  prevent your medicines from working properly (may interact). It is also important to:  Talk with your pharmacist or health care provider about all the medicines that you take, their possible side effects, and what medicines are safe to take together.  Make it your goal to take part in all treatment decisions (shared decision-making). Ask about possible side effects of medicines that your health care provider recommends, and tell him or her how you feel about having those side effects. It is best if shared decision-making with your health care provider is part of your total treatment plan.  Take over-the-counter and prescription medicines only as told by your health care provider. General instructions  Ask your health care provider if it is safe for you to drive.  Return to your normal activities as told by your health care provider. Ask your health care provider what  activities are safe for you.  Keep all follow-up visits as told by your health care providers. This is important. Where to find support You can get support for managing non-epileptic seizures from support groups, either online or in-person. Your health care provider may be able to recommend a support group in your area. Where to find more information  Epilepsy Foundation: www.epilepsy.com  American Epilepsy Society: www.aesnet.org Contact a health care provider if:  Your seizures change or become more frequent.  You continue to have seizures after treatment. Get help right away if:  You injure yourself during a seizure.  You have: ? One seizure after another. ? Trouble recovering from a seizure. ? Chest pain or trouble breathing. ? A seizure that lasts longer than 5 minutes. These symptoms may represent a serious problem that is an emergency. Do not wait to see if the symptoms will go away. Get medical help right away. Call your local emergency services (911 in the [place] Do not drive yourself to the hospital. Summary  Seizures that are not caused by epilepsy are called non-epileptic seizures. The two types of non-epileptic seizures are physiologic non-epileptic seizure and psychogenic non-epileptic seizure (PNES).  PNES is a treatable mental health disorder that is caused by psychological distress. It is very important to work with your mental health provider to find a treatment that works for you.  Learning to manage stress and anxiety is an important part of PNES treatment.  Make sure family members, friends, and co-workers are trained in how to help you if you have a seizure. If you have a seizure, they should lay you on the ground to prevent a fall, protect your head and neck, and turn you onto your side. This information is not intended to replace advice given to you by your health care provider. Make sure you discuss any questions you have with your health care  provider. Document Released: 08/07/2017 Document Revised: 12/23/2018 Document Reviewed: 08/07/2017 Elsevier Patient Education  2020 [employer].

## 2019-10-23 NOTE — Care Management (Signed)
1244 10-23-19 CM received permission to call [employer]- patient and staff RN aware that patient will need to be ready in the next 10 minutes and pickup will be at the [facility] ED. No further needs from CM at this time. Bethena Roys, RN,BSN Case Manager [phone]

## 2019-10-24 ENCOUNTER — Telehealth (HOSPITAL_COMMUNITY): Payer: Self-pay | Admitting: Licensed Clinical Social Worker

## 2019-10-24 NOTE — Telephone Encounter (Signed)
CSW called pt to check in following discharge from the hospital.  Pt reports he is doing ok and was very appreciative to the inpatient team who took care of him.  CSW saw that pt had f/up appt with [facility] on Thursday next week for post hospital follow up- pt agreeable to switching that to Tuesday 11/10 so we could get him transport through [facility] who only transports to [place] on Tuesdays.  CSW called [facility] and scheduled transport for him to that appt.  CSW will continue to follow and assist as needed  [name], [facility] Desk#: [phone] Cell#: [phone]

## 2019-10-28 ENCOUNTER — Telehealth (HOSPITAL_COMMUNITY): Payer: Self-pay | Admitting: Licensed Clinical Social Worker

## 2019-10-28 ENCOUNTER — Telehealth (HOSPITAL_COMMUNITY): Payer: Self-pay

## 2019-10-28 ENCOUNTER — Other Ambulatory Visit (HOSPITAL_COMMUNITY): Payer: Self-pay

## 2019-10-28 MED ORDER — ATORVASTATIN CALCIUM 80 MG PO TABS: 80.0000 mg | ORAL_TABLET | Freq: Every day | ORAL | 1 refills | Status: DC

## 2019-10-28 MED FILL — FUROSEMIDE 40 MG TAB: 40 | 30 days supply | Qty: 60 | Fill #3

## 2019-10-28 MED FILL — ATORVASTATIN 80 MG TABLET: 80 | 90 days supply | Qty: 90 | Fill #0

## 2019-10-28 MED FILL — POTASSIUM CHLORIDE CRYS ER: 20 | 30 days supply | Qty: 30 | Fill #3

## 2019-10-28 NOTE — Telephone Encounter (Signed)
Called [name] back, he states he is feeling better and he will hold off from going to ED for now.  He said if he feels worse, he will go to ED.  I reiterated importance of being evaluated if he is not well.  He verbalized understanding.

## 2019-10-28 NOTE — Telephone Encounter (Signed)
CSW called pharmacy to order patient mediations to be mailed to him- they will ship out today.  CSW called pt to inform regarding medications and pt reports having chest pain near ICD site which has been bothering him since he discharged from the hospital.  CSW informed RN of these concerns and they are following up to see what MD recommendations are.  CSW will continue to follow and assist as needed  [name], [facility] Desk#: [phone] Cell#: [phone]

## 2019-10-28 NOTE — Telephone Encounter (Signed)
Received message from social worker Eliezer Lofts stating patient is having chest pain near site of ICD.  Called patient to assess, he reported that pain is constant and goes into back.  He said he has had the pain since leaving hospital.  He has SOB, worse with activity. I urged him to go to ED for eval as he had a major event in the hospital last week.  He did not want to go.  Discussed with Dr Aundra Dubin, he also urged for patient to go to ED. Patient reports he is at a junk yard and would not be able to go now.  Advised he is is strongly encouraged to go to ED for evaluation.  Pt said that I should call him back in an hour.

## 2019-10-30 ENCOUNTER — Encounter (HOSPITAL_COMMUNITY): Payer: Medicaid Other

## 2019-10-30 NOTE — Telephone Encounter (Signed)
Called patient to check on him. No answer, unable to leave vm.

## 2019-10-31 ENCOUNTER — Other Ambulatory Visit: Payer: Self-pay

## 2019-11-04 ENCOUNTER — Encounter (HOSPITAL_COMMUNITY): Payer: Self-pay

## 2019-11-04 ENCOUNTER — Other Ambulatory Visit: Payer: Self-pay

## 2019-11-04 ENCOUNTER — Ambulatory Visit (HOSPITAL_COMMUNITY)
Admission: RE | Admit: 2019-11-04 | Discharge: 2019-11-04 | Disposition: A | Discharge status: Home / Self Care | Payer: Medicaid Other | Source: Ambulatory Visit | Attending: Cardiology | Admitting: Cardiology

## 2019-11-04 VITALS — BP 118/62 | HR 93 | Wt 154.4 lb

## 2019-11-04 DIAGNOSIS — I110 Hypertensive heart disease with heart failure: Secondary | ICD-10-CM | POA: Insufficient documentation

## 2019-11-04 DIAGNOSIS — Z95810 Presence of automatic (implantable) cardiac defibrillator: Secondary | ICD-10-CM | POA: Diagnosis not present

## 2019-11-04 DIAGNOSIS — Z9114 Patient's other noncompliance with medication regimen: Secondary | ICD-10-CM | POA: Diagnosis not present

## 2019-11-04 DIAGNOSIS — Z9119 Patient's noncompliance with other medical treatment and regimen: Secondary | ICD-10-CM | POA: Insufficient documentation

## 2019-11-04 DIAGNOSIS — I5022 Chronic systolic (congestive) heart failure: Secondary | ICD-10-CM | POA: Diagnosis not present

## 2019-11-04 DIAGNOSIS — Z951 Presence of aortocoronary bypass graft: Secondary | ICD-10-CM | POA: Diagnosis not present

## 2019-11-04 DIAGNOSIS — I252 Old myocardial infarction: Secondary | ICD-10-CM | POA: Insufficient documentation

## 2019-11-04 DIAGNOSIS — Z8674 Personal history of sudden cardiac arrest: Secondary | ICD-10-CM | POA: Diagnosis not present

## 2019-11-04 DIAGNOSIS — Z7901 Long term (current) use of anticoagulants: Secondary | ICD-10-CM | POA: Insufficient documentation

## 2019-11-04 DIAGNOSIS — M1990 Unspecified osteoarthritis, unspecified site: Secondary | ICD-10-CM | POA: Insufficient documentation

## 2019-11-04 DIAGNOSIS — E7800 Pure hypercholesterolemia, unspecified: Secondary | ICD-10-CM | POA: Diagnosis not present

## 2019-11-04 DIAGNOSIS — I255 Ischemic cardiomyopathy: Secondary | ICD-10-CM | POA: Diagnosis not present

## 2019-11-04 DIAGNOSIS — I2510 Atherosclerotic heart disease of native coronary artery without angina pectoris: Secondary | ICD-10-CM | POA: Insufficient documentation

## 2019-11-04 DIAGNOSIS — Z79899 Other long term (current) drug therapy: Secondary | ICD-10-CM | POA: Insufficient documentation

## 2019-11-04 DIAGNOSIS — R569 Unspecified convulsions: Secondary | ICD-10-CM | POA: Diagnosis not present

## 2019-11-04 LAB — BASIC METABOLIC PANEL
Anion gap: 13 (ref 5–15)
BUN: 10 mg/dL (ref 6–20)
CO2: 20 mmol/L — ABNORMAL LOW (ref 22–32)
Calcium: 8.9 mg/dL (ref 8.9–10.3)
Chloride: 104 mmol/L (ref 98–111)
Creatinine, Ser: 0.99 mg/dL (ref 0.61–1.24)
GFR calc Af Amer: >60 mL/min (ref >60)
GFR calc non Af Amer: >60 mL/min (ref >60)
Glucose, Bld: 102 mg/dL — ABNORMAL HIGH (ref 70–99)
Potassium: 4.1 mmol/L (ref 3.5–5.1)
Sodium: 137 mmol/L (ref 135–145)

## 2019-11-04 NOTE — Patient Instructions (Signed)
It was great to see you today! No medication changes are needed at this time.  Your physician recommends that you schedule a follow-up appointment in: 2 weeks with the heart failure pharmacy team  Your physician recommends that you schedule a follow-up appointment in: 6-8 weeks with Dr Haroldine Laws  You have been referred to [facility]rology 12/30/2019 @ 10:30 with Dr Jaynee Eagles Address: [address] [place], [place] [address] Phone: [phone]
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
## 2019-11-04 NOTE — Progress Notes (Signed)
CSW met with pt during clinic visit.  Pt reports doing better and states he has been feeling better than normal on his new medications to control for seizures.  CSW worked with clinic staff to have follow up appts set for Tuesdays between 9am-12pm so that we can get [facility] transportation.  CSW called [facility] following appt and scheduled pt for transport to 11/24 and 12/22 appts  CSW will continue to follow and assist as needed  [name], [place] [facility] Desk#: [phone] Cell#: [phone]

## 2019-11-18 ENCOUNTER — Encounter (HOSPITAL_COMMUNITY): Payer: Self-pay

## 2019-11-18 ENCOUNTER — Other Ambulatory Visit: Payer: Self-pay

## 2019-11-18 ENCOUNTER — Ambulatory Visit (HOSPITAL_COMMUNITY)
Admission: RE | Admit: 2019-11-18 | Discharge: 2019-11-18 | Disposition: A | Discharge status: Home / Self Care | Payer: Medicaid Other | Source: Ambulatory Visit | Attending: Cardiology | Admitting: Cardiology

## 2019-11-18 VITALS — BP 131/80 | HR 65 | Wt 159.2 lb

## 2019-11-18 DIAGNOSIS — F1010 Alcohol abuse, uncomplicated: Secondary | ICD-10-CM | POA: Insufficient documentation

## 2019-11-18 DIAGNOSIS — Z951 Presence of aortocoronary bypass graft: Secondary | ICD-10-CM | POA: Diagnosis not present

## 2019-11-18 DIAGNOSIS — I25810 Atherosclerosis of coronary artery bypass graft(s) without angina pectoris: Secondary | ICD-10-CM | POA: Diagnosis not present

## 2019-11-18 DIAGNOSIS — I252 Old myocardial infarction: Secondary | ICD-10-CM | POA: Insufficient documentation

## 2019-11-18 DIAGNOSIS — E7800 Pure hypercholesterolemia, unspecified: Secondary | ICD-10-CM | POA: Insufficient documentation

## 2019-11-18 DIAGNOSIS — Z8249 Family history of ischemic heart disease and other diseases of the circulatory system: Secondary | ICD-10-CM | POA: Insufficient documentation

## 2019-11-18 DIAGNOSIS — Z8674 Personal history of sudden cardiac arrest: Secondary | ICD-10-CM | POA: Diagnosis not present

## 2019-11-18 DIAGNOSIS — Z9119 Patient's noncompliance with other medical treatment and regimen: Secondary | ICD-10-CM | POA: Diagnosis not present

## 2019-11-18 DIAGNOSIS — Z79899 Other long term (current) drug therapy: Secondary | ICD-10-CM | POA: Diagnosis not present

## 2019-11-18 DIAGNOSIS — R569 Unspecified convulsions: Secondary | ICD-10-CM | POA: Insufficient documentation

## 2019-11-18 DIAGNOSIS — I110 Hypertensive heart disease with heart failure: Secondary | ICD-10-CM | POA: Diagnosis not present

## 2019-11-18 DIAGNOSIS — Z95810 Presence of automatic (implantable) cardiac defibrillator: Secondary | ICD-10-CM | POA: Insufficient documentation

## 2019-11-18 DIAGNOSIS — I255 Ischemic cardiomyopathy: Secondary | ICD-10-CM | POA: Insufficient documentation

## 2019-11-18 DIAGNOSIS — I2510 Atherosclerotic heart disease of native coronary artery without angina pectoris: Secondary | ICD-10-CM

## 2019-11-18 DIAGNOSIS — I5022 Chronic systolic (congestive) heart failure: Secondary | ICD-10-CM

## 2019-11-18 LAB — BASIC METABOLIC PANEL
Anion gap: 10 (ref 5–15)
BUN: 7 mg/dL (ref 6–20)
CO2: 22 mmol/L (ref 22–32)
Calcium: 8.6 mg/dL — ABNORMAL LOW (ref 8.9–10.3)
Chloride: 96 mmol/L — ABNORMAL LOW (ref 98–111)
Creatinine, Ser: 0.86 mg/dL (ref 0.61–1.24)
GFR calc Af Amer: >60 mL/min (ref >60)
GFR calc non Af Amer: >60 mL/min (ref >60)
Glucose, Bld: 91 mg/dL (ref 70–99)
Potassium: 5 mmol/L (ref 3.5–5.1)
Sodium: 128 mmol/L — ABNORMAL LOW (ref 135–145)

## 2019-11-18 MED ORDER — SACUBITRIL-VALSARTAN 97-103 MG PO TABS: 1.0000 | ORAL_TABLET | Freq: Two times a day (BID) | ORAL | 6 refills | Status: DC

## 2019-11-18 MED FILL — ENTRESTO 97 MG-103 MG TAB: 97-103 | 30 days supply | Qty: 60 | Fill #0

## 2019-11-18 NOTE — Patient Instructions (Addendum)
Labs today  STOP Potassium  INCREASE Entresto to 97/103mg  (1 tab) twice a day  Repeat blood work at Dr Forde [name] office [place] on 12/01/19.  Your physician recommends that you schedule a follow-up appointment in: keep appt with Dr  Haroldine Laws on Tuesday, Decemebr 22, 2020 at [place] code [phone]  Please call office at [phone] option [phone] if you have any questions or concerns.    At the [facility], you and your health needs are our priority. As part of our continuing mission to provide you with exceptional heart care, we have created designated Provider Care Teams. These Care Teams include your primary Cardiologist (physician) and Advanced Practice Providers (APPs- Physician Assistants and Nurse Practitioners) who all work together to provide you with the care you need, when you need it.   You may see any of the following providers on your designated Care Team at your next follow up: Marland Kitchen Dr Glori Bickers . Dr Loralie Champagne . Darrick Grinder, NP . Lyda Jester, PA   Please be sure to bring in all your medications bottles to every appointment.  ]

## 2019-11-18 NOTE — Progress Notes (Signed)
CSW met with pt during visit to check in.  Pt reports feeling ok at this time just trying to be careful with the rising COVID cases.  Patient medication changed to Entresto 97-103- CSW picked up from the outpt pharmacy for patient.  CSW will continue to follow and assist as needed  [name], [facility] Desk#: [phone] Cell#: [phone]

## 2019-11-18 NOTE — Progress Notes (Unsigned)
ReDS Vest / Clip - 11/18/19 1000      ReDS Vest / Clip   Station Marker  C    Ruler Value  30    ReDS Value  Low volume    Anatomical Comments  sitting

## 2019-11-18 NOTE — Progress Notes (Signed)
PCP: None  Primary Cardiologist: Dr Gala Romney   HPI: [name] [facility] a 58 y.o.malewith h/o ETOH, VF Arrest, CAD s/p CABG 12/07/17, and ICM with EF 25-30%.  Admitted 12/6 - 12/14/17 with V-Fib arrest while chopping wood. Received CPR in the field. R/LHC with severe 3vD as below and cardiogenic shock requiring IABP. Required milrinone with continued shock. Pt improved and IABP removed 12/10, but pt had recurrent VF arrest so IABP replaced. Stabilized and underwent CABG 12/07/17. Tolerated gradual wean of meds and extubation with no furthe events. HF medications titrated as tolerated.  Admitted 12/27 ->12/28 with syncope and AKI thought to be 2/2 volume depletion on lasix, spiro, and Entresto. CT negative for ICH. Improved rapidly with holding meds and gentle IVF. Entresto stopped. Spiro cut back and switched to low dose losartan.  He had repeat echo 04/2019 that showed persistently reduced LVEF at 25%.    Last clinic visit w/ Dr. Gala Romney on 7/22, losartan was discontinued and pt placed back on low dose Entresto (has been tolerating well w/o recurrent syncope/ near syncope). Given EF < 35% despite medical therapy, he was referred to EP and seen by Dr. Ladona Ridgel who recommended ICD implantation.    He was admitted 08/29/19 for implantation of a [employer] single chamber ICD.   He had routien clinic f/u on 10/29 and while in exam room had a witnessed seizure with loss of pulse. CPR started with quick ROSC. He did not require shock. Taken to the ED and had another seizure. Neurology consulted. Started on Keppra. HS Trop 10>12.  ICD interrogated. No arrhythmias noted.  From HF perspective he had mild volume overload and had a dose of IV lasix and transitioned back to 40 mg PO daily. His previous HF meds were restarted w/ addition of spironolactone.  He had no recurrent seizures after initiation of Keprra. Neurology recommended continuation of Keppra 500 mg twice a day and he was  instructed not to drive.   Had return clinic f/u 11/10 and reported full compliance w/ Keppra. No recurrent seizures. Still drinking beer daily, about 7 a day. Not interested in cutting back, helps w/ his stress. Difficult social situations at home. He was stable from a HF standpoint and euvolemic but was confused and unsure about his meds. Unfortunately, he does not live in [facility] and does not qualify for paramedicine.  He presents back to clinic today for medication management/ assistance w/ his pill box.   Today, his wt is up 5lb from last OV from 154>>159. Notes exertional dyspnea w/ mild-moderate activity. No resting dyspnea. ReDs clip measurement however is within normal range at 31%. No chest pain and no recurrent seizures. BP 131/80. In his pill bag, he has 2 bottles of Entresto both 24/26 and 49/51 and he reports he has been filling his pill box from both bottles. Also taking 40 mg of Lasix daily and 12.5 mg of spironolactone.     We checked labs during appt. BMP showed normal SCr 0.86. K upper limits of normal at 5.0.    ROS: All systems negative except as listed in HPI, PMH and Problem List.  SH:  Social History   Socioeconomic History  . Marital status: Divorced    Spouse name: Not on file  . Number of children: Not on file  . Years of education: Not on file  . Highest education level: Not on file  Occupational History  . Not on file  Social Needs  . Financial resource strain: Not  on file  . Food insecurity    Worry: Not on file    Inability: Not on file  . Transportation needs    Medical: Not on file    Non-medical: Not on file  Tobacco Use  . Smoking status: Never Smoker  . Smokeless tobacco: Never Used  Substance and Sexual Activity  . Alcohol use: Yes    Comment: 12/20/2017 "I quit drinking after I had the heart attack"  . Drug use: No  . Sexual activity: Not Currently  Lifestyle  . Physical activity    Days per week: Not on file    Minutes per session:  Not on file  . Stress: Not on file  Relationships  . Social Musicianconnections    Talks on phone: Not on file    Gets together: Not on file    Attends religious service: Not on file    Active member of club or organization: Not on file    Attends meetings of clubs or organizations: Not on file    Relationship status: Not on file  . Intimate partner violence    Fear of current or ex partner: Not on file    Emotionally abused: Not on file    Physically abused: Not on file    Forced sexual activity: Not on file  Other Topics Concern  . Not on file  Social History Narrative  . Not on file    FH:  Family History  Problem Relation Age of Onset  . Heart attack Other     Past Medical History:  Diagnosis Date  . AICD (automatic cardioverter/defibrillator) present   . Arthritis    "hands; maybe my toes" (12/20/2017)  . CAD (coronary artery disease)   . Cardiac arrest (HCC) 11/29/2017   hx VF arrest/notes 12/20/2017  . Coronary artery disease   . GERD (gastroesophageal reflux disease)   . H/O ETOH abuse    /notes 12/20/2017  . High cholesterol   . Hypertension   . Ischemic cardiomyopathy    a. 08/2019 s/p BSX D232 single lead AICD.  Marland Kitchen. Seizure (HCC) 10/21/2019  . Seizures (HCC) ~ 2016; ~ 2017   "I've had a couple; I was at work" (12/20/2017)  . Syncope and collapse    "last few days" (12/20/2017)    Current Outpatient Medications  Medication Sig Dispense Refill  . atorvastatin (LIPITOR) 80 MG tablet Take 1 tablet (80 mg total) by mouth daily at 6 PM. 90 tablet 1  . carvedilol (COREG) 6.25 MG tablet Take 1 tablet (6.25 mg total) by mouth 2 (two) times daily with a meal. 180 tablet 3  . furosemide (LASIX) 40 MG tablet Take 1 tablet (40 mg total) by mouth daily. May take extra 40mg  daily as needed for swelling 30 tablet   . levETIRAcetam (KEPPRA) 500 MG tablet Take 1 tablet (500 mg total) by mouth 2 (two) times daily. 60 tablet 6  . potassium chloride SA (K-DUR) 20 MEQ tablet Take 1  tablet (20 mEq total) by mouth daily. Take 1 tab when you take a dose of lasix as needed 30 tablet 6  . sacubitril-valsartan (ENTRESTO) 24-26 MG Take 1 tablet by mouth 2 (two) times daily. 60 tablet 3  . spironolactone (ALDACTONE) 25 MG tablet Take 0.5 tablets (12.5 mg total) by mouth daily. 30 tablet 6   No current facility-administered medications for this encounter.     Vitals:   11/18/19 1006  Weight: 72.2 kg (159 lb 3.2 oz)   Wt Readings  from Last 3 Encounters:  11/18/19 72.2 kg (159 lb 3.2 oz)  11/04/19 70 kg (154 lb 6.4 oz)  10/23/19 68.1 kg (150 lb 2.1 oz)      PHYSICAL EXAM: ReDs Clip 31%  PHYSICAL EXAM: General:  Well appearing middle aged WM. No respiratory difficulty HEENT: normal Neck: supple. no JVD. Carotids 2+ bilat; no bruits. No lymphadenopathy or thyromegaly appreciated. Cor: PMI nondisplaced. Regular rate & rhythm. No rubs, gallops or murmurs. Lungs: clear Abdomen: soft, nontender, nondistended. No hepatosplenomegaly. No bruits or masses. Good bowel sounds. Extremities: no cyanosis, clubbing, rash, edema Neuro: alert & oriented x 3, cranial nerves grossly intact. moves all 4 extremities w/o difficulty. Affect pleasant.  ASSESSMENT & PLAN:  1. Seizure: recent diagnosis 09/2019.  -On Keppra 500 mg bid. Reports full compliance. Denies any recurrent seizures.  -No driving  -Has scheduled f/u w/ neurology in Jan 2021  2. Chronic systolic CHF with ICM - TTE 11/30/17 25-30% RV normal. Echo 03/2018: EF40-45%RV normal.  - ECHO 5/20 EF ~ 25%. Has ICD.  - NYHA II-III symptoms.  - Wt up 5 lb since last OV. But appears euvolemic on exam. ReDs clip measurement 31%.  - Discussed Entresto dosing w/ clinic PharmD (pt apparently taking both 24/26 + 49/51 tablets in combination at home). We checked BMP today prior to pt leaving clinic. SCr 0.86 and K upper limits of normal at 5.0. With SBP in the 130s, we will increase Entresto dose to 97/103 and will stop supp Kdur.  Pill box was filled w/ appropriate meds, including 97/103 of Entresto.  - Continue 40 mg lasix daily.    - Continue Spironolactone 12.5 mg daily  -Continue Coreg to 6.25 mg bid - Repeat BMP in 1 week  - Unfortunately, not eligible for paramedicine because he is outside of [facility].   3. CAD s/p CABG 12/07/2017 - no s/s of angina -ContinueASA  blockerandatorvastatin.  4. H/o VF arrest in setting of STEMI - now has ICD, recent device interrogation during recent hospitalization showed no arrhthymias  -has EP clinic f/u w/ Dr. Lovena Le 12/7  5. ETOH abuse  - he needs to reduce consumption. We discussed this again today.    6. H/O Noncompliance/ Poor Health Literacy  - unfortunately not eligible for paramedicine because he is outside of [facility] - We assisted him w/ his pill box today and reviewed medications. Printed off new medication list as guide to help him w/ box refills in the next coming weeks   Keep f/u w/ Dr. Haroldine Laws on 12/22.     Brittainy Simmons PA-C 10:07 AM

## 2019-11-24 ENCOUNTER — Other Ambulatory Visit (HOSPITAL_COMMUNITY): Payer: Self-pay

## 2019-11-24 ENCOUNTER — Telehealth (HOSPITAL_COMMUNITY): Payer: Self-pay | Admitting: Licensed Clinical Social Worker

## 2019-11-24 MED ORDER — ATORVASTATIN CALCIUM 80 MG PO TABS: 80.0000 mg | ORAL_TABLET | Freq: Every day | ORAL | 3 refills | Status: DC

## 2019-11-24 MED ORDER — CARVEDILOL 6.25 MG PO TABS: 6.2500 mg | ORAL_TABLET | Freq: Two times a day (BID) | ORAL | 3 refills | Status: DC

## 2019-11-24 MED FILL — CARVEDILOL 6.25 MG TABLET: 6.25 | 90 days supply | Qty: 180 | Fill #1

## 2019-11-24 MED FILL — ATORVASTATIN 80 MG TABLET: 80 | 90 days supply | Qty: 90 | Fill #0

## 2019-11-24 MED FILL — FUROSEMIDE 40 MG TAB: 40 | 30 days supply | Qty: 60 | Fill #1

## 2019-11-24 MED FILL — levETIRAcetam 500 MG TABS: 500 | 30 days supply | Qty: 60 | Fill #0

## 2019-11-24 NOTE — Telephone Encounter (Addendum)
CSW called [facility] to request refill be sent for pt lasix and keppra.  Tried to reorder lipitor but was informed that pt does not have any other refills- sent message to clinic to request new script for this medication  CSW called to have lipitor and patient coreg also sent out after refill put in.  CSW will continue to follow and assist as needed  [name], [facility] Desk#: [phone] Cell#: [phone]

## 2019-11-25 ENCOUNTER — Telehealth (HOSPITAL_COMMUNITY): Payer: Self-pay | Admitting: Licensed Clinical Social Worker

## 2019-11-25 NOTE — Telephone Encounter (Signed)
CSW attempted to call pt to confirm we can set up transport to appt next week but CSW received notice that pt phone is not currently working.  CSW called pt sister and informed of need to speak with pt- pt sister will try to get a hold of him and provide him with my number  [name], [facility] Desk#: [phone] Cell#: [phone]

## 2019-11-28 ENCOUNTER — Telehealth (HOSPITAL_COMMUNITY): Payer: Self-pay | Admitting: Licensed Clinical Social Worker

## 2019-11-28 NOTE — Telephone Encounter (Signed)
CSW attempted to call pt again to inquire about Mondays appt and getting him transportation- no answer goes straight to an error message stating pt phone number is not currently in service.  CSW called pt sister again and she states she has not been able to get a hold of him either- has attempted to call pt and has sent facebook messages to him with no response.  CSW will continue to attempt contact  [name], [place] Worker [facility] Desk#: [phone] Cell#: [phone]

## 2019-12-01 ENCOUNTER — Ambulatory Visit (INDEPENDENT_AMBULATORY_CARE_PROVIDER_SITE_OTHER): Payer: Medicaid Other | Admitting: *Deleted

## 2019-12-01 ENCOUNTER — Encounter: Payer: Medicaid Other | Admitting: Internal Medicine

## 2019-12-01 DIAGNOSIS — Z95810 Presence of automatic (implantable) cardiac defibrillator: Secondary | ICD-10-CM

## 2019-12-01 LAB — CUP PACEART REMOTE DEVICE CHECK
Date Time Interrogation Session: 20201207065347
Implantable Lead Implant Date: 20200904
Implantable Lead Location: 753860
Implantable Lead Model: 293
Implantable Lead Serial Number: 443717
Implantable Pulse Generator Implant Date: 20200904
Pulse Gen Serial Number: 271403

## 2019-12-05 ENCOUNTER — Telehealth (HOSPITAL_COMMUNITY): Payer: Self-pay | Admitting: Licensed Clinical Social Worker

## 2019-12-05 NOTE — Telephone Encounter (Signed)
CSW received call from pt as I have been unable to reach him for some time.  Pt reports his phone has been broken but that he purchased a new one- contact information updated.  Pt missed appt on Monday with [facility] because CSW was unable to confirm pt transport so CSW called [facility] and rescheduled appt for 12/29- CSW called [facility] and set up transport for appt.  CSW updated pt  CSW will continue to follow and assist as needed  [name], [facility] Desk#: [phone] Cell#: [phone]

## 2019-12-08 ENCOUNTER — Telehealth (HOSPITAL_COMMUNITY): Payer: Self-pay | Admitting: Licensed Clinical Social Worker

## 2019-12-08 DIAGNOSIS — E871 Hypo-osmolality and hyponatremia: Secondary | ICD-10-CM

## 2019-12-08 DIAGNOSIS — F10929 Alcohol use, unspecified with intoxication, unspecified: Secondary | ICD-10-CM | POA: Diagnosis not present

## 2019-12-08 DIAGNOSIS — R531 Weakness: Secondary | ICD-10-CM | POA: Diagnosis not present

## 2019-12-08 DIAGNOSIS — I2510 Atherosclerotic heart disease of native coronary artery without angina pectoris: Secondary | ICD-10-CM

## 2019-12-08 DIAGNOSIS — R296 Repeated falls: Secondary | ICD-10-CM | POA: Diagnosis not present

## 2019-12-08 DIAGNOSIS — R42 Dizziness and giddiness: Secondary | ICD-10-CM | POA: Diagnosis not present

## 2019-12-08 DIAGNOSIS — Z95810 Presence of automatic (implantable) cardiac defibrillator: Secondary | ICD-10-CM | POA: Diagnosis not present

## 2019-12-08 DIAGNOSIS — Z951 Presence of aortocoronary bypass graft: Secondary | ICD-10-CM | POA: Diagnosis not present

## 2019-12-08 NOTE — Telephone Encounter (Signed)
CSW received call from pt brother, Aaron Edelman, this morning to inform me that Glenda fell this morning and had what he believed was a seizure.  Pt admitted to [facility] and [facility] called this afternoon to get updates- they state his sodium is very low and believe this is what caused his fall- they are providing fluids while they do further workup.  CSW will continue to follow and assist as needed  [name], [facility] Desk#: [phone] Cell#: [phone]

## 2019-12-09 DIAGNOSIS — Z95810 Presence of automatic (implantable) cardiac defibrillator: Secondary | ICD-10-CM | POA: Diagnosis not present

## 2019-12-09 DIAGNOSIS — Z951 Presence of aortocoronary bypass graft: Secondary | ICD-10-CM | POA: Diagnosis not present

## 2019-12-09 DIAGNOSIS — F10929 Alcohol use, unspecified with intoxication, unspecified: Secondary | ICD-10-CM | POA: Diagnosis not present

## 2019-12-09 DIAGNOSIS — I2510 Atherosclerotic heart disease of native coronary artery without angina pectoris: Secondary | ICD-10-CM | POA: Diagnosis not present

## 2019-12-10 DIAGNOSIS — I2510 Atherosclerotic heart disease of native coronary artery without angina pectoris: Secondary | ICD-10-CM | POA: Diagnosis not present

## 2019-12-10 DIAGNOSIS — Z951 Presence of aortocoronary bypass graft: Secondary | ICD-10-CM | POA: Diagnosis not present

## 2019-12-10 DIAGNOSIS — Z95810 Presence of automatic (implantable) cardiac defibrillator: Secondary | ICD-10-CM | POA: Diagnosis not present

## 2019-12-10 DIAGNOSIS — F10929 Alcohol use, unspecified with intoxication, unspecified: Secondary | ICD-10-CM | POA: Diagnosis not present

## 2019-12-11 DIAGNOSIS — F10929 Alcohol use, unspecified with intoxication, unspecified: Secondary | ICD-10-CM | POA: Diagnosis not present

## 2019-12-11 DIAGNOSIS — Z951 Presence of aortocoronary bypass graft: Secondary | ICD-10-CM | POA: Diagnosis not present

## 2019-12-11 DIAGNOSIS — Z95810 Presence of automatic (implantable) cardiac defibrillator: Secondary | ICD-10-CM | POA: Diagnosis not present

## 2019-12-11 DIAGNOSIS — I2510 Atherosclerotic heart disease of native coronary artery without angina pectoris: Secondary | ICD-10-CM | POA: Diagnosis not present

## 2019-12-12 DIAGNOSIS — Z951 Presence of aortocoronary bypass graft: Secondary | ICD-10-CM | POA: Diagnosis not present

## 2019-12-12 DIAGNOSIS — F10929 Alcohol use, unspecified with intoxication, unspecified: Secondary | ICD-10-CM | POA: Diagnosis not present

## 2019-12-12 DIAGNOSIS — I2510 Atherosclerotic heart disease of native coronary artery without angina pectoris: Secondary | ICD-10-CM | POA: Diagnosis not present

## 2019-12-12 DIAGNOSIS — Z95810 Presence of automatic (implantable) cardiac defibrillator: Secondary | ICD-10-CM | POA: Diagnosis not present

## 2019-12-15 ENCOUNTER — Telehealth (HOSPITAL_COMMUNITY): Payer: Self-pay | Admitting: Licensed Clinical Social Worker

## 2019-12-15 NOTE — Telephone Encounter (Signed)
CSW attempted to call pt to confirm appt tomorrow but pt phone going straight to error message.  CSW called sister who reports she thinks his phone is out of minutes but she will attempt to get in contact with him as well.  CSW will continue to follow and assist as needed  [name], [facility] Desk#: [phone] Cell#: [phone]

## 2019-12-16 ENCOUNTER — Encounter (HOSPITAL_COMMUNITY): Payer: Self-pay | Admitting: Internal Medicine

## 2019-12-16 ENCOUNTER — Other Ambulatory Visit: Payer: Self-pay

## 2019-12-16 ENCOUNTER — Ambulatory Visit (HOSPITAL_COMMUNITY)
Admission: RE | Admit: 2019-12-16 | Discharge: 2019-12-16 | Disposition: A | Discharge status: Home / Self Care | Payer: Medicaid Other | Source: Ambulatory Visit | Attending: Internal Medicine | Admitting: Internal Medicine

## 2019-12-16 VITALS — BP 133/72 | HR 92 | Wt 158.8 lb

## 2019-12-16 DIAGNOSIS — I110 Hypertensive heart disease with heart failure: Secondary | ICD-10-CM | POA: Diagnosis not present

## 2019-12-16 DIAGNOSIS — R569 Unspecified convulsions: Secondary | ICD-10-CM | POA: Diagnosis present

## 2019-12-16 DIAGNOSIS — I255 Ischemic cardiomyopathy: Secondary | ICD-10-CM | POA: Diagnosis not present

## 2019-12-16 DIAGNOSIS — Z79899 Other long term (current) drug therapy: Secondary | ICD-10-CM | POA: Diagnosis not present

## 2019-12-16 DIAGNOSIS — Z951 Presence of aortocoronary bypass graft: Secondary | ICD-10-CM | POA: Insufficient documentation

## 2019-12-16 DIAGNOSIS — E7800 Pure hypercholesterolemia, unspecified: Secondary | ICD-10-CM | POA: Diagnosis not present

## 2019-12-16 DIAGNOSIS — Z8674 Personal history of sudden cardiac arrest: Secondary | ICD-10-CM | POA: Diagnosis not present

## 2019-12-16 DIAGNOSIS — F1010 Alcohol abuse, uncomplicated: Secondary | ICD-10-CM | POA: Diagnosis not present

## 2019-12-16 DIAGNOSIS — Z91199 Patient's noncompliance with other medical treatment and regimen due to unspecified reason: Secondary | ICD-10-CM

## 2019-12-16 DIAGNOSIS — Z9119 Patient's noncompliance with other medical treatment and regimen: Secondary | ICD-10-CM | POA: Diagnosis not present

## 2019-12-16 DIAGNOSIS — I252 Old myocardial infarction: Secondary | ICD-10-CM | POA: Insufficient documentation

## 2019-12-16 DIAGNOSIS — Z8249 Family history of ischemic heart disease and other diseases of the circulatory system: Secondary | ICD-10-CM | POA: Diagnosis not present

## 2019-12-16 DIAGNOSIS — Z95810 Presence of automatic (implantable) cardiac defibrillator: Secondary | ICD-10-CM | POA: Insufficient documentation

## 2019-12-16 DIAGNOSIS — I25810 Atherosclerosis of coronary artery bypass graft(s) without angina pectoris: Secondary | ICD-10-CM | POA: Insufficient documentation

## 2019-12-16 DIAGNOSIS — I5022 Chronic systolic (congestive) heart failure: Secondary | ICD-10-CM

## 2019-12-16 DIAGNOSIS — I2510 Atherosclerotic heart disease of native coronary artery without angina pectoris: Secondary | ICD-10-CM

## 2019-12-16 LAB — BASIC METABOLIC PANEL
Anion gap: 11 (ref 5–15)
BUN: 14 mg/dL (ref 6–20)
CO2: 22 mmol/L (ref 22–32)
Calcium: 8.9 mg/dL (ref 8.9–10.3)
Chloride: 101 mmol/L (ref 98–111)
Creatinine, Ser: 1.61 mg/dL — ABNORMAL HIGH (ref 0.61–1.24)
GFR calc Af Amer: 55 mL/min — ABNORMAL LOW (ref >60)
GFR calc non Af Amer: 47 mL/min — ABNORMAL LOW (ref >60)
Glucose, Bld: 92 mg/dL (ref 70–99)
Potassium: 4.7 mmol/L (ref 3.5–5.1)
Sodium: 134 mmol/L — ABNORMAL LOW (ref 135–145)

## 2019-12-16 LAB — BRAIN NATRIURETIC PEPTIDE: B Natriuretic Peptide: 120 pg/mL — ABNORMAL HIGH (ref 0.0–100.0)

## 2019-12-16 MED FILL — ENTRESTO 97 MG-103 MG TAB: 97-103 | 30 days supply | Qty: 60 | Fill #0

## 2019-12-16 NOTE — Patient Instructions (Signed)
Labs were done today. We will only call you if there are any Abnormalities. No Call Is A Good Call!!   Your Provider would like to see you back in 2 months.  At the [facility], you and your health needs are our priority. As part of our continuing mission to provide you with exceptional heart care, we have created designated Provider Care Teams. These Care Teams include your primary Cardiologist (physician) and Advanced Practice Providers (APPs- Physician Assistants and Nurse Practitioners) who all work together to provide you with the care you need, when you need it.   You may see any of the following providers on your designated Care Team at your next follow up: Marland Kitchen Dr Glori Bickers . Dr Loralie Champagne . Darrick Grinder, NP . Lyda Jester, PA . Audry Riles, PharmD   Please be sure to bring in all your medications bottles to every appointment.

## 2019-12-16 NOTE — Progress Notes (Signed)
ADVANCED HF CLINIC NOTE  PCP: None  Primary Cardiologist: Dr Haroldine Laws   HPI: [name] [facility] a 58 y.o.malewith h/o ETOH, VF Arrest, CAD s/p CABG 12/07/17, and ICM with EF 25-30%.  Admitted 12/6 - 12/14/17 with V-Fib arrest while chopping wood. Received CPR in the field. R/LHC with severe 3vD as below and cardiogenic shock requiring IABP. Required milrinone with continued shock. Pt improved and IABP removed 12/10, but pt had recurrent VF arrest so IABP replaced. Stabilized and underwent CABG 12/07/17. Tolerated gradual wean of meds and extubation with no furthe events. HF medications titrated as tolerated.  Admitted 12/27 ->12/28 with syncope and AKI thought to be 2/2 volume depletion on lasix, spiro, and Entresto. CT negative for ICH. Improved rapidly with holding meds and gentle IVF. Entresto stopped. Spiro cut back and switched to low dose losartan.  He had repeat echo 04/2019 that showed persistently reduced LVEF at 25%.    Last clinic visit w/ Dr. Haroldine Laws on 7/22, losartan was discontinued and pt placed back on low dose Entresto (has been tolerating well w/o recurrent syncope/ near syncope). Given EF < 35% despite medical therapy, he was referred to EP and seen by Dr. Lovena Le who recommended ICD implantation.    He was admitted 08/29/19 for implantation of a [employer] single chamber ICD.   He had routine clinic f/u on 10/23/19 and while in exam room had a witnessed seizure with loss of pulse. CPR started with quick ROSC. He did not require shock. Taken to the ED and had another seizure. Neurology consulted. Started on Keppra. HS Trop 10>12.  ICD interrogated. No arrhythmias noted.  From HF perspective he had mild volume overload and had a dose of IV lasix and transitioned back to 40 mg PO daily. His previous HF meds were restarted w/ addition of spironolactone.  He had no recurrent seizures after initiation of Keprra. Neurology recommended continuation of Keppra 500  mg twice a day and he was instructed not to drive.   Had return clinic f/u 11/04/19 and reported full compliance w/ Keppra.  Admitted to [facility] on 12/09/19 with recurrent seizures in setting of noncompliance with his Keppra. Now living with his brother in [place]. Says he thinks he is taking his medicines. Denies recurrent seizures. Unsure about his medications. Gets SOB with mild activity. No orthopnea or PND. No CP. Still drinking beer every day. Says he wishes he was drinking a 6-pack per day but he doesn't have the money for that.    ROS: All systems negative except as listed in HPI, PMH and Problem List.  SH:  Social History   Socioeconomic History  . Marital status: Divorced    Spouse name: Not on file  . Number of children: Not on file  . Years of education: Not on file  . Highest education level: Not on file  Occupational History  . Not on file  Tobacco Use  . Smoking status: Never Smoker  . Smokeless tobacco: Never Used  Substance and Sexual Activity  . Alcohol use: Yes    Comment: 12/20/2017 "I quit drinking after I had the heart attack"  . Drug use: No  . Sexual activity: Not Currently  Other Topics Concern  . Not on file  Social History Narrative  . Not on file   Social Determinants of Health   Financial Resource Strain:   . Difficulty of Paying Living Expenses: Not on file  Food Insecurity:   . Worried About Charity fundraiser  in the Last Year: Not on file  . Ran Out of Food in the Last Year: Not on file  Transportation Needs:   . Lack of Transportation (Medical): Not on file  . Lack of Transportation (Non-Medical): Not on file  Physical Activity:   . Days of Exercise per Week: Not on file  . Minutes of Exercise per Session: Not on file  Stress:   . Feeling of Stress : Not on file  Social Connections:   . Frequency of Communication with Friends and Family: Not on file  . Frequency of Social Gatherings with Friends and Family: Not on file    . Attends Religious Services: Not on file  . Active Member of Clubs or Organizations: Not on file  . Attends Banker Meetings: Not on file  . Marital Status: Not on file  Intimate Partner Violence:   . Fear of Current or Ex-Partner: Not on file  . Emotionally Abused: Not on file  . Physically Abused: Not on file  . Sexually Abused: Not on file    FH:  Family History  Problem Relation Age of Onset  . Heart attack Other     Past Medical History:  Diagnosis Date  . AICD (automatic cardioverter/defibrillator) present   . Arthritis    "hands; maybe my toes" (12/20/2017)  . CAD (coronary artery disease)   . Cardiac arrest (HCC) 11/29/2017   hx VF arrest/notes 12/20/2017  . Coronary artery disease   . GERD (gastroesophageal reflux disease)   . H/O ETOH abuse    /notes 12/20/2017  . High cholesterol   . Hypertension   . Ischemic cardiomyopathy    a. 08/2019 s/p BSX D232 single lead AICD.  Marland Kitchen Seizure (HCC) 10/21/2019  . Seizures (HCC) ~ 2016; ~ 2017   "I've had a couple; I was at work" (12/20/2017)  . Syncope and collapse    "last few days" (12/20/2017)    Current Outpatient Medications  Medication Sig Dispense Refill  . atorvastatin (LIPITOR) 80 MG tablet Take 1 tablet (80 mg total) by mouth daily at 6 PM. 90 tablet 3  . carvedilol (COREG) 6.25 MG tablet Take 1 tablet (6.25 mg total) by mouth 2 (two) times daily with a meal. 180 tablet 3  . furosemide (LASIX) 40 MG tablet Take 1 tablet (40 mg total) by mouth daily. May take extra 40mg  daily as needed for swelling 30 tablet   . levETIRAcetam (KEPPRA) 500 MG tablet Take 1 tablet (500 mg total) by mouth 2 (two) times daily. 60 tablet 6  . sacubitril-valsartan (ENTRESTO) 97-103 MG Take 1 tablet by mouth 2 (two) times daily. 60 tablet 6  . spironolactone (ALDACTONE) 25 MG tablet Take 0.5 tablets (12.5 mg total) by mouth daily. 30 tablet 6   No current facility-administered medications for this encounter.    Vitals:    12/16/19 1029  BP: 133/72  Pulse: 92  SpO2: 96%  Weight: 72 kg (158 lb 12.8 oz)   Wt Readings from Last 3 Encounters:  12/16/19 72 kg (158 lb 12.8 oz)  11/18/19 72.2 kg (159 lb 3.2 oz)  11/04/19 70 kg (154 lb 6.4 oz)      PHYSICAL EXAM: General:  Well appearing somewhat disshelved . No resp difficulty HEENT: normal Neck: supple. no JVD. Carotids 2+ bilat; no bruits. No lymphadenopathy or thryomegaly appreciated. Cor: PMI nondisplaced. Regular rate & rhythm. No rubs, gallops or murmurs. Lungs: clear Abdomen: soft, nontender, nondistended. No hepatosplenomegaly. No bruits or masses. Good bowel  sounds. Extremities: no cyanosis, clubbing, rash, edema Neuro: alert & orientedx3, cranial nerves grossly intact. moves all 4 extremities w/o difficulty. Affect pleasant  ASSESSMENT & PLAN:  1. Seizure: recent diagnosis 09/2019.  -On Keppra 500 mg bid.  - Recurrent seizure last week in setting of missing Keppra. Stressed need to be compliant  2. Chronic systolic CHF with ICM - TTE 11/30/17 25-30% RV normal. Echo 03/2018: EF40-45%RV normal.  - ECHO 5/20 EF ~ 25%. Has ICD.  - Chronic NYHA II-III symptoms.  - Volume status stable   - Extensive discussion in clinic with help of SW about which meds he is on. Seems to be taking all meds except carvedilol but he isn't sure. All other meds he brought with him today and we confirmed one by one. He is going to go home and check for carvedilol. If not on will restart. Will not change meds today as I think it will only make compliance worse - Continue 40 mg lasix daily.    - Continue Spironolactone 12.5 mg daily  - Continue Entresto 97/103 bid -Continue Coreg to 6.25 mg bid - Labs today - Unfortunately, not eligible for paramedicine [place]. - ICD interrogated toay personally. No VT. Volume ok  - Will need repeat echo in next few months to reassess EF - Not candidate for advanced HF therapies  3. CAD s/p  CABG 12/07/2017 - no symptoms of angina -ContinueASA  blockerandatorvastatin.  4. H/o VF arrest in setting of STEMI - stable Interrogation as above  5. ETOH abuse  - he needs to reduce consumption. He is not interested in quitting  6. H/O Noncompliance/ Poor Health Literacy  - unfortunately not eligible for paramedicine because [place] [facility] [place] - With the help of our SW team we assisted him w/ his pill box today and reviewed medications.  Total time spent 35 minutes. Over half that time spent discussing above.    Arvilla Meresaniel Yamili Lichtenwalner MD 10:55 AM

## 2019-12-17 ENCOUNTER — Other Ambulatory Visit: Payer: Self-pay

## 2019-12-21 NOTE — Progress Notes (Deleted)
Cardiology Office Note Date:  12/21/2019  Patient ID:  [name], DOB 06/19/1961, MRN [id number] PCP:  Patient, No Pcp Per  Cardiologist:  Dr. [name] Electrophysiologist: Dr. [name]  ***refresh   Chief Complaint: *** ??? 3 mo post implant visit  History of Present Illness: [name] is a 58 y.o. male with history of CAD (CABG Dec 2018), ICM, VF arrest, ICD, chronic CHF (systolic), ETOH abuse, new onset seizure d/o (Oct 2020 and started on Keppra).  Admitted 12/6 - 12/14/17 with V-Fib arrest while chopping wood. Received CPR in the field. R/LHC with severe 3vD as below and cardiogenic shock requiring IABP. Required milrinone with continued shock. Pt improved and IABP removed 12/10, but pt had recurrent VF arrest so IABP replaced. Stabilized and underwent CABG 12/07/17  He had some minimal improvement in his LVEF following this, though again may 2020 EF fell to 30-35%, and this fall underwent ICD implant  He had a hospitalization 12/09/2019 2/2 recurrent seizures (in the environment of medicine non-compliance). He saw AHF team 12/16/2019, he had moved in with his brother, reported that he thought he was taking his medicines and when asked about his ETOH, was still drinking and mentioned that he wishes he was drinking a 6-pack per day but he didn't have the money for that.  thier pharmacy team spent time with him educating him on his medicines and helping with a pill box, was not a candidate for paramedicine visits. Planned for an echo in a few months, not a candidate for advanced therapies, labs were updated.  No changes were made to his medicines.  The patient was not interested in quitting ETOH unfortunately.  *** volume status *** seizures? *** meds compliance *** shocks, syncope *** ETOH *** labs/BMET for CHF  Requested *** labs 12/22 with high creat >>> any changes since then *** change to chronic lead outputs *** NEEDS ASA   Device information BSCi  single chamber ICD, implanted 08/29/2019    Past Medical History:  Diagnosis Date  . AICD (automatic cardioverter/defibrillator) present   . Arthritis    "hands; maybe my toes" (12/20/2017)  . CAD (coronary artery disease)   . Cardiac arrest (HCC) 11/29/2017   hx VF arrest/notes 12/20/2017  . Coronary artery disease   . GERD (gastroesophageal reflux disease)   . H/O ETOH abuse    /notes 12/20/2017  . High cholesterol   . Hypertension   . Ischemic cardiomyopathy    a. 08/2019 s/p BSX D232 single lead AICD.  Marland Kitchen. Seizure (HCC) 10/21/2019  . Seizures (HCC) ~ 2016; ~ 2017   "I've had a couple; I was at work" (12/20/2017)  . Syncope and collapse    "last few days" (12/20/2017)    Past Surgical History:  Procedure Laterality Date  . CARDIAC CATHETERIZATION    . CORONARY ARTERY BYPASS GRAFT N/A 12/07/2017   Procedure: CORONARY ARTERY BYPASS GRAFTING (CABG) times four using left internal mammary artery and right saphenous vein using endoscope for harvest.;  Surgeon: Kerin PernaVan Trigt, Peter, MD;  [facility];  Service: Open Heart Surgery;  Laterality: N/A;  . IABP INSERTION N/A 11/29/2017   Procedure: IABP Insertion;  Surgeon: [name], [name], MD;  Location: [facility] INVASIVE CV LAB;  Service: Cardiovascular;  Laterality: N/A;  . IABP INSERTION N/A 12/03/2017   Procedure: IABP INSERTION;  Surgeon: Dolores PattyBensimhon, [name], MD;  Location: [facility] INVASIVE CV LAB;  Service: Cardiovascular;  Laterality: N/A;  . ICD IMPLANT N/A 08/29/2019   Procedure: ICD IMPLANT;  Surgeon: Evans Lance, MD;  Location: [place] CV LAB;  Service: Cardiovascular;  Laterality: N/A;  . LEFT HEART CATH AND CORONARY ANGIOGRAPHY N/A 11/29/2017   Procedure: LEFT HEART CATH AND CORONARY ANGIOGRAPHY;  Surgeon: Nelva Bush, MD;  Location: [place] CV LAB;  Service: Cardiovascular;  Laterality: N/A;  . RIGHT HEART CATH N/A 11/29/2017   Procedure: RIGHT HEART CATH;  Surgeon: Jolaine Artist, MD;  Location: [place] CV LAB;   Service: Cardiovascular;  Laterality: N/A;  . TEE WITHOUT CARDIOVERSION N/A 12/07/2017   Procedure: TRANSESOPHAGEAL ECHOCARDIOGRAM (TEE);  Surgeon: Prescott Gum, Collier Salina, MD;  Location: [place];  Service: Open Heart Surgery;  Laterality: N/A;  . TONSILLECTOMY AND ADENOIDECTOMY  ~ 1972    Current Outpatient Medications  Medication Sig Dispense Refill  . atorvastatin (LIPITOR) 80 MG tablet Take 1 tablet (80 mg total) by mouth daily at 6 PM. 90 tablet 3  . carvedilol (COREG) 6.25 MG tablet Take 1 tablet (6.25 mg total) by mouth 2 (two) times daily with a meal. 180 tablet 3  . furosemide (LASIX) 40 MG tablet Take 1 tablet (40 mg total) by mouth daily. May take extra 40mg  daily as needed for swelling 30 tablet   . levETIRAcetam (KEPPRA) 500 MG tablet Take 1 tablet (500 mg total) by mouth 2 (two) times daily. 60 tablet 6  . sacubitril-valsartan (ENTRESTO) 97-103 MG Take 1 tablet by mouth 2 (two) times daily. 60 tablet 6  . spironolactone (ALDACTONE) 25 MG tablet Take 0.5 tablets (12.5 mg total) by mouth daily. 30 tablet 6   No current facility-administered medications for this visit.    Allergies:   Patient has no known allergies.   Social History:  The patient  reports that he has never smoked. He has never used smokeless tobacco. He reports current alcohol use. He reports that he does not use drugs.   Family History:  The patient's family history includes Heart attack in an other family member.  ROS:  Please see the history of present illness.  All other systems are reviewed and otherwise negative.   PHYSICAL EXAM: *** VS:  There were no vitals taken for this visit. BMI: There is no height or weight on file to calculate BMI. Well nourished, well developed, in no acute distress  HEENT: normocephalic, atraumatic  Neck: no JVD, carotid bruits or masses Cardiac:  *** RRR; no significant murmurs, no rubs, or gallops Lungs:  *** CTA b/l, no wheezing, rhonchi or rales  Abd: soft, nontender MS: no  deformity or *** atrophy Ext: *** no edema  Skin: warm and dry, no rash Neuro:  No gross deficits appreciated Psych: euthymic mood, full affect  *** ICD site is stable, no tethering or discomfort   EKG:  Not done today  ICD interrogation done today and reviewed by myself; ***   06/09/2019: TTE IMPRESSIONS 1. The left ventricle has moderate-severely reduced systolic function, with an ejection fraction of 30-35%. The cavity size was moderately dilated. Left ventricular diastolic Doppler parameters are consistent with impaired relaxation.  2. The right ventricle has normal systolic function. The cavity was normal. There is no increase in right ventricular wall thickness.  3. Mild thickening of the mitral valve leaflet. Mild calcification of the mitral valve leaflet. There is mild mitral annular calcification present.  4. The aortic valve is tricuspid. Mild thickening of the aortic valve. Sclerosis without any evidence of stenosis of the aortic valve.    04/15/2018 : LVEF 40-45% 12/07/2017 : LVEF  40-45%   Recent Labs: 10/21/2019: Hemoglobin 13.2; Platelets 144 12/16/2019: B Natriuretic Peptide 120.0; BUN 14; Creatinine, Ser 1.61; Potassium 4.7; Sodium 134  No results found for requested labs within last 8760 hours.   Estimated Creatinine Clearance: 51.2 mL/min (A) (by C-G formula based on SCr of 1.61 mg/dL (H)).   Wt Readings from Last 3 Encounters:  12/16/19 158 lb 12.8 oz (72 kg)  11/18/19 159 lb 3.2 oz (72.2 kg)  11/04/19 154 lb 6.4 oz (70 kg)     Other studies reviewed: Additional studies/records reviewed today include: summarized above***  ASSESSMENT AND PLAN:  1. ICD     *** acute implant lead outputs decreased to chronic settings     ***  2. ICM 3. Chronic CHF (systolic     *** on BB, Entresto, lasix aldactone     *** follows closely with AHF team     *** BMET today for HF   4. CAD     *** on BB, statin  5. ETOH 6. Medicine non-compliance     ***      Disposition: F/u with ***  Current medicines are reviewed at length with the patient today.  The patient did not have any concerns regarding medicines.***  Signed, Francis Dowse, PA-C 12/21/2019 3:05 PM     [facility] [address] [place] [address] [phone] (office)  [phone] (fax)

## 2019-12-23 ENCOUNTER — Encounter: Payer: Medicaid Other | Admitting: Physician Assistant

## 2019-12-24 ENCOUNTER — Telehealth (HOSPITAL_COMMUNITY): Payer: Self-pay | Admitting: Licensed Clinical Social Worker

## 2019-12-24 NOTE — Telephone Encounter (Signed)
CSW called [facility] and scheduled a ride for him for next appt with clinic on 2/23- ride confirmed.  CSW attempted to call pt to discuss upcoming [place] [facility] appt on 1/5 but unable to reach patient- phone still appears to be out of minutes  CSW will continue to attempt contact  [name], [place] [facility] Desk#: [phone] Cell#: [phone]

## 2019-12-25 ENCOUNTER — Telehealth (HOSPITAL_COMMUNITY): Payer: Self-pay | Admitting: Licensed Clinical Social Worker

## 2019-12-25 NOTE — Telephone Encounter (Signed)
CSW attempted to call pt phone again and went straight to voicemail.  CSW then called [employer] which is a convenient store that pt frequents- left message with cashier to have him call me if he comes in.  CSW received call from pt who confirms that he was able to find the coreg that he was missing during last appt and has been taking it as instructed.  CSW inquired if he could make appt next Tuesday with Neurology to address his seizures and he said yes and that he plans to get a phone today so he can receive calls again which would be necessary for me to set up transport for him next week.  Pt reports he had another seizure a few days ago but didn't go to the hospital- reports that he is feeling ok now.  CSW will continue to follow and assist as needed  [name], [place] [facility] Desk#: [phone] Cell#: [phone]

## 2019-12-28 NOTE — Progress Notes (Signed)
ICD remote 

## 2019-12-29 ENCOUNTER — Telehealth (HOSPITAL_COMMUNITY): Payer: Self-pay | Admitting: Licensed Clinical Social Worker

## 2019-12-29 NOTE — Telephone Encounter (Signed)
CSW received call from patient with a new telephone number- he states this will be his new permanent number so CSW added to chart.  Pt has important initial appt tomorrow with Neurology to address his seizures- pt confirms he can attend this appt but needs help with transport.  CSW requested ride through [facility] to get him to appt and confirmed appt information with Neurology office.  CSW will continue to follow and assist as needed  Burna Sis, LCSW Clinical Social Worker [facility] Desk#: [phone] Cell#: [phone]

## 2019-12-30 ENCOUNTER — Other Ambulatory Visit: Payer: Self-pay

## 2019-12-30 ENCOUNTER — Ambulatory Visit: Payer: Medicaid Other | Admitting: Neurology

## 2019-12-30 ENCOUNTER — Encounter: Payer: Self-pay | Admitting: Neurology

## 2019-12-30 VITALS — BP 94/60 | HR 93 | Temp 97.8°F | Ht 70.5 in | Wt 150.0 lb

## 2019-12-30 DIAGNOSIS — G40409 Other generalized epilepsy and epileptic syndromes, not intractable, without status epilepticus: Secondary | ICD-10-CM

## 2019-12-30 HISTORY — DX: Other generalized epilepsy and epileptic syndromes, not intractable, without status epilepticus: G40.409

## 2019-12-30 MED ORDER — LEVETIRACETAM 500 MG PO TABS: 500.0000 mg | ORAL_TABLET | Freq: Two times a day (BID) | ORAL | 3 refills | Status: DC

## 2019-12-30 MED FILL — levETIRAcetam 500 MG TABS: 500 | 90 days supply | Qty: 180 | Fill #0

## 2019-12-30 MED FILL — levETIRAcetam 500 MG TABS: 500 | 90 days supply | Qty: 180 | Fill #0 | Status: TO

## 2019-12-30 NOTE — Patient Instructions (Signed)
Epilepsy Epilepsy is when a person keeps having seizures. A seizure is a burst of abnormal activity in the brain. A seizure can change how you think or behave, and it can make it hard to be aware of what is happening. This condition can cause problems such as:  Falls, accidents, and injury.  Sadness (depression).  Poor memory.  Sudden unexplained death in epilepsy (SUDEP). This is rare. Its cause is not known. Most people with epilepsy lead normal lives. What are the causes? This condition may be caused by:  A head injury.  An injury that happens at birth.  A high fever during childhood.  A stroke.  Bleeding that goes into or around the brain.  Certain medicines and drugs.  Having too little oxygen for a long period of time.  Abnormal brain development.  Certain infections.  Brain tumors.  Conditions that are passed from parent to child (are hereditary). What are the signs or symptoms? Symptoms of a seizure vary from person to person. They may include:  Jerky movements of muscles (convulsions).  Stiffening of the body.  Movements of the arms or legs that you are not able to control.  Passing out (loss of consciousness).  Breathing problems.  Sudden falls.  Confusion.  Head nodding.  Eye blinking or twitching.  Lip smacking.  Drooling.  Fast eye movements.  Grunting.  Not being able to control when you pee or poop.  Staring.  Being hard to wake up (unresponsiveness). Some people have symptoms right before a seizure happens (aura) and right after a seizure happens. These symptoms include:  Fear or anxiety.  Feeling sick to your stomach (nauseous).  Feeling like the room is spinning (vertigo).  A feeling of having seen or heard something before (dj vu).  Odd tastes or smells.  Changes in how you see (vision), such as seeing flashing lights or spots. Symptoms that follow a seizure include:  Being confused.  Being sleepy.  Having a  headache. How is this treated? Treatment can control seizures. Treatment for this condition may involve:  Taking medicines to control seizures.  Having a device (vagus nerve stimulator) put in the chest. The device sends signals to a nerve and to the brain to prevent seizures.  Brain surgery to stop seizures from happening or to reduce how often they happen.  Having blood tests often to make sure you are getting the right amount of medicine. Once this condition has been diagnosed, it is important to start treatment as soon as possible. For some people, epilepsy goes away in time. Others will need treatment for the rest of their life. Follow these instructions at home: Medicines  Take over-the-counter and prescription medicines only as told by your doctor.  Avoid anything that may keep your medicine from working, such as alcohol. Activity  Get enough rest. Lack of sleep can make seizures more likely to occur.  Follow your doctor's advice about driving, swimming, and doing anything else that would be dangerous if you had a seizure. ? If you live in the [place] check with your local DMV (department of motor vehicles) to find out about local driving laws. Each state has rules about when you can return to driving. Teaching others Teach friends and family what to do if you have a seizure. They should:  Lay you on the ground to prevent a fall.  Cushion your head and body.  Loosen any tight clothing around your neck.  Turn you on your side.  Stay with   you until you are better.  Not hold you down.  Not put anything in your mouth.  Know whether or not you need emergency care.  General instructions  Avoid anything that causes you to have seizures.  Keep a seizure diary. Write down what you remember about each seizure. Be sure to include what might have caused it.  Keep all follow-up visits as told by your doctor. This is important. Contact a doctor if:  You have a change in how  often or when you have seizures.  You get an infection or start to feel sick. You may have more seizures when you are sick. Get help right away if:  A seizure does not stop after 5 minutes.  You have more than one seizure in a row, and you do not have enough time between the seizures to feel better.  A seizure makes it harder to breathe.  A seizure is different from other seizures you have had.  A seizure makes you unable to speak or use a part of your body.  You did not wake up right after a seizure. These symptoms may be an emergency. Do not wait to see if the symptoms will go away. Get medical help right away. Call your local emergency services (911 in the [place] Do not drive yourself to the hospital. Summary  Epilepsy is when a person keeps having seizures. A seizure is a burst of abnormal activity in the brain.  Treatment can control seizures.  Teach friends and family what to do if you have a seizure. This information is not intended to replace advice given to you by your health care provider. Make sure you discuss any questions you have with your health care provider. Document Revised: 08/05/2018 Document Reviewed: 08/05/2018 Elsevier Patient Education  2020 [employer]  

## 2019-12-30 NOTE — Progress Notes (Signed)
[facility]    Provider:  Dr Jaynee Eagles Requesting Provider: Greta Doom,* Primary Care Provider:   Glori Bickers, MD  CC:  Seizures  HPI:  [name] is a 59 y.o. male here as requested by Greta Doom,* for seizures.  Past medical history seizures, ischemic cardiomyopathy, hypertension, high cholesterol, alcohol abuse, coronary artery disease and cardiac arrest, arthritis, AICD.  Patient was referred to [place] from [facility] and I reviewed the notes of his admission.  He has a history of seizures that was presumed to be alcohol withdrawal related in the past treated at [facility].  He did have a history of withdrawal symptoms in 2016 but that was the first seizure associated with alcohol withdrawal.  Patient was in heart failure clinic and he slumped over and turned blue with seizure-like activity CPR was performed with no clear pulse was detected.  He was brought to the emergency room where his mental status gradually improved but then developed convulsive activity again.  Activity described as a vocalization and subsequent bilateral flexion and clonic activity with leftward gaze which lasted 45 seconds.  The eye deviation suggest a focal seizure with secondary generalization.  EEG was negative.  There was no change in alcohol consumption.  Antiepilepsy medication was started Keppra 500 mg twice daily.  An MRI of the brain was also obtained.  He was advised not to drive for 6 months and continue Keppra.  He had his first seizure in 2016. He was flopping around on the bathroom floor. He went to [facility] at that time. The next time he had a seizure was when he was admitted at [facility]. He is a very poor historian. He got up to go to the kitchen and he fell down. He does not remember going to the hospital. He remembers being in the hospital. He doesn't know what medication they started him on but he says he has been taking it. No side effects. Discussed keeping him  on the medications the rest of his life. No seizures since that time. He is not interested in alcohol cessation. No smoking. He has a Education officer, museum. He does not know how long the seizure lasted. He denies any urination or tongue biting.   Reviewed notes, labs and imaging from outside physicians, which showed:  I reviewed MRI of the brain images and agree with the following 10/22/2019:  1. Motion degraded examination. 2. No evidence of acute intracranial abnormality. 3. No specific cause of seizure is identified. 4. Redemonstrated chronic bilateral basal ganglia lacunar infarcts. A chronic lacunar infarct within the left cerebellum is new from prior MRI 08/18/2015. There is a background of mild chronic small vessel ischemic disease. 5. Stable generalized parenchymal atrophy, markedly advanced for age.  I reviewed EEG report which was within normal limits without seizures or epileptiform discharges.  I also reviewed [facility] notes from 2016, at that time he had a history of alcohol abuse, he presented with fall and decreased responsiveness after a witnessed seizure, per chart he had no significant past medical history, he had not been feeling well for several days and he went to the bathroom when he was found on the floor, witnessed seizure-like episode, subsequently confused, denied tongue biting or incontinence complained of left shoulder pain and difficulty moving, there was initial concern for stroke, he reported he drinks 9 to 10 cans of 12 ounces of beer a day and it was thought that this was possibly an alcohol withdrawal seizure CT of  the head and MRI did not show any significant findings.  They did not recommend any antiepileptic meds.  Review of Systems: Patient complains of symptoms per HPI as well as the following symptoms: alcohol abuse. Pertinent negatives and positives per HPI. All others negative.   Social History   Socioeconomic History  . Marital status: Divorced    Spouse  name: Not on file  . Number of children: Not on file  . Years of education: Not on file  . Highest education level: Not on file  Occupational History  . Not on file  Tobacco Use  . Smoking status: Never Smoker  . Smokeless tobacco: Never Used  Substance and Sexual Activity  . Alcohol use: Yes    Alcohol/week: 0.0 - 42.0 standard drinks    Comment: 12/20/2017 "I quit drinking after I had the heart attack"  . Drug use: No  . Sexual activity: Not Currently  Other Topics Concern  . Not on file  Social History Narrative   Lives at home with his brother who is crippled. He takes care of his brother.    Right handed    Social Determinants of Health   Financial Resource Strain:   . Difficulty of Paying Living Expenses: Not on file  Food Insecurity:   . Worried About Programme researcher, broadcasting/film/video in the Last Year: Not on file  . Ran Out of Food in the Last Year: Not on file  Transportation Needs:   . Lack of Transportation (Medical): Not on file  . Lack of Transportation (Non-Medical): Not on file  Physical Activity:   . Days of Exercise per Week: Not on file  . Minutes of Exercise per Session: Not on file  Stress:   . Feeling of Stress : Not on file  Social Connections:   . Frequency of Communication with Friends and Family: Not on file  . Frequency of Social Gatherings with Friends and Family: Not on file  . Attends Religious Services: Not on file  . Active Member of Clubs or Organizations: Not on file  . Attends Banker Meetings: Not on file  . Marital Status: Not on file  Intimate Partner Violence:   . Fear of Current or Ex-Partner: Not on file  . Emotionally Abused: Not on file  . Physically Abused: Not on file  . Sexually Abused: Not on file    Family History  Problem Relation Age of Onset  . Heart attack Other     Past Medical History:  Diagnosis Date  . AICD (automatic cardioverter/defibrillator) present   . Arthritis    "hands; maybe my toes" (12/20/2017)   . CAD (coronary artery disease)   . Cardiac arrest (HCC) 11/29/2017   hx VF arrest/notes 12/20/2017  . Coronary artery disease   . GERD (gastroesophageal reflux disease)   . H/O ETOH abuse    /notes 12/20/2017  . High cholesterol   . Hypertension   . Ischemic cardiomyopathy    a. 08/2019 s/p BSX D232 single lead AICD.  Marland Kitchen Seizure (HCC) 10/21/2019  . Seizures (HCC) ~ 2016; ~ 2017   "I've had a couple; I was at work" (12/20/2017)  . Syncope and collapse    "last few days" (12/20/2017)    Patient Active Problem List   Diagnosis Date Noted  . 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 Seizure (HCC) 10/21/2019  . ICD (implantable cardioverter-defibrillator) in place 08/29/2019  . Chronic systolic  heart failure (HCC) 08/25/2019  . Orthostatic hypotension 12/21/2017  . Syncope 12/20/2017  . 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 Coronary artery disease 12/07/2017  . Aspiration pneumonia [facility])     Past Surgical History:  Procedure Laterality Date  . CARDIAC CATHETERIZATION    . CORONARY ARTERY BYPASS GRAFT N/A 12/07/2017   Procedure: CORONARY ARTERY BYPASS GRAFTING (CABG) times four using left internal mammary artery and right saphenous vein using endoscope for harvest.;  Surgeon: [name], MD;  Location: [facility] OR;  Service: Open Heart Surgery;  Laterality: N/A;  . IABP INSERTION N/A 11/29/2017   Procedure: IABP Insertion;  Surgeon: [name], MD;  Location: [facility] INVASIVE CV LAB;  Service: Cardiovascular;  Laterality: N/A;  . IABP INSERTION N/A 12/03/2017   Procedure: IABP INSERTION;  Surgeon: [name], MD;  Location: [facility] INVASIVE CV LAB;  Service: Cardiovascular;  Laterality: N/A;  . ICD IMPLANT N/A 08/29/2019   Procedure: ICD IMPLANT;  Surgeon: Marinus Maw, MD;  Location: [facility] INVASIVE CV LAB;  Service: Cardiovascular;  Laterality: N/A;  . LEFT HEART CATH AND CORONARY ANGIOGRAPHY N/A 11/29/2017     Procedure: LEFT HEART CATH AND CORONARY ANGIOGRAPHY;  Surgeon: [name], MD;  Location: [facility] INVASIVE CV LAB;  Service: Cardiovascular;  Laterality: N/A;  . RIGHT HEART CATH N/A 11/29/2017   Procedure: RIGHT HEART CATH;  Surgeon: [name], MD;  Location: [facility] INVASIVE CV LAB;  Service: Cardiovascular;  Laterality: N/A;  . TEE WITHOUT CARDIOVERSION N/A 12/07/2017   Procedure: TRANSESOPHAGEAL ECHOCARDIOGRAM (TEE);  Surgeon: Donata Clay, Theron Arista, MD;  Location: [facility] OR;  Service: Open Heart Surgery;  Laterality: N/A;  . TONSILLECTOMY AND ADENOIDECTOMY  ~ 1972    Current Outpatient Medications  Medication Sig Dispense Refill  . atorvastatin (LIPITOR) 80 MG tablet Take 1 tablet (80 mg total) by mouth daily at 6 PM. 90 tablet 3  . carvedilol (COREG) 6.25 MG tablet Take 1 tablet (6.25 mg total) by mouth 2 (two) times daily with a meal. 180 tablet 3  . furosemide (LASIX) 40 MG tablet Take 1 tablet (40 mg total) by mouth daily. May take extra 40mg  daily as needed for swelling 30 tablet   . levETIRAcetam (KEPPRA) 500 MG tablet Take 1 tablet (500 mg total) by mouth 2 (two) times daily. 180 tablet 3  . sacubitril-valsartan (ENTRESTO) 97-103 MG Take 1 tablet by mouth 2 (two) times daily. 60 tablet 6  . spironolactone (ALDACTONE) 25 MG tablet Take 0.5 tablets (12.5 mg total) by mouth daily. 30 tablet 6   No current facility-administered medications for this visit.    Allergies as of 12/30/2019  . (No Known Allergies)    Vitals: BP 94/60 (BP Location: Right Arm, Patient Position: Sitting)   Pulse 93   Temp 97.8 F (36.6 C) Comment: taken at front  Ht 5' 10.5" (1.791 m)   Wt 150 lb (68 kg)   BMI 21.22 kg/m  Last Weight:  Wt Readings from Last 1 Encounters:  12/30/19 150 lb (68 kg)   Last Height:   Ht Readings from Last 1 Encounters:  12/30/19 5' 10.5" (1.791 m)     Physical exam: Exam: Gen: NAD, conversant, well nourised, obese, well groomed                     CV: RRR, no  MRG. No Carotid Bruits. No peripheral edema, warm, nontender Eyes: Conjunctivae clear without exudates or hemorrhage  Neuro: Detailed Neurologic Exam  Speech:  Psychomotor slowing; fluent with normal comprehension.  Cognition:    The patient is oriented to person, place, and time;     recent and remote memory impaired;     language fluent;     Impaired attention, concentration, fund of knowledge Cranial Nerves:    The pupils are equal, round, and reactive to light. Attempted fundoscpy could not visualze due to small pupils.  Visual fields are full to finger confrontation. Extraocular movements are intact. Trigeminal sensation is intact and the muscles of mastication are normal. The face is symmetric. The palate elevates in the midline. Hearing intact. Voice is normal. Shoulder shrug is normal. The tongue has normal motion without fasciculations.   Coordination:    No dysmetria, tremulous  Gait:    Slightly wide based but stable  Motor Observation:    tremulous Tone:    Normal muscle tone.    Posture:    Posture is normal.     Strength:    Strength is V/V in the upper and lower limbs.      Sensation: intact to LT     Reflex Exam:  DTR's:    Absent AJs otherwise deep tendon reflexes in the upper and lower extremities are brisk bilaterally.   Toes:    The toes are equiv bilaterally.   Clonus:    Clonus is absent.    Assessment/Plan: Patient with seizures thought to be alcohol withdrawal in the past but recently witnessed at [facility] with tonic-clonic activity; eye deviation suggestive of focal seizure with secondary generalization.  Continue Keppra likely for life  Discussed the following  Per [facility] statutes, patients with seizures are not allowed to drive until they have been seizure-free for six months.    Use caution when using heavy equipment or power tools. Avoid working on ladders or at heights. Take showers instead of baths. Ensure  the water temperature is not too high on the home water heater. Do not go swimming alone. Do not lock yourself in a room alone (i.e. bathroom). When caring for infants or small children, sit down when holding, feeding, or changing them to minimize risk of injury to the child in the event you have a seizure. Maintain good sleep hygiene. Avoid alcohol.    If patient has another seizure, call 911 and bring them back to the ED if: A.  The seizure lasts longer than 5 minutes.      B.  The patient doesn't wake shortly after the seizure or has new problems such as difficulty seeing, speaking or moving following the seizure C.  The patient was injured during the seizure D.  The patient has a temperature over 102 F (39C) E.  The patient vomited during the seizure and now is having trouble breathing  Per [facility] statutes, patients with seizures are not allowed to drive until they have been seizure-free for six months.  Other recommendations include using caution when using heavy equipment or power tools. Avoid working on ladders or at heights. Take showers instead of baths.  Do not swim alone.  Ensure the water temperature is not too high on the home water heater. Do not go swimming alone. Do not lock yourself in a room alone (i.e. bathroom). When caring for infants or small children, sit down when holding, feeding, or changing them to minimize risk of injury to the child in the event you have a seizure. Maintain good sleep hygiene. Avoid alcohol.  Also recommend adequate sleep, hydration,  good diet and minimize stress.  During the Seizure  - First, ensure adequate ventilation and place patients on the floor on their left side  Loosen clothing around the neck and ensure the airway is patent. If the patient is clenching the teeth, do not force the mouth open with any object as this can cause severe damage - Remove all items from the surrounding that can be hazardous. The patient may be oblivious to  what's happening and may not even know what he or she is doing. If the patient is confused and wandering, either gently guide him/her away and block access to outside areas - Reassure the individual and be comforting - Call 911. In most cases, the seizure ends before EMS arrives. However, there are cases when seizures may last over 3 to 5 minutes. Or the individual may have developed breathing difficulties or severe injuries. If a pregnant patient or a person with diabetes develops a seizure, it is prudent to call an ambulance. - Finally, if the patient does not regain full consciousness, then call EMS. Most patients will remain confused for about 45 to 90 minutes after a seizure, so you must use judgment in calling for help. - Avoid restraints but make sure the patient is in a bed with padded side rails - Place the individual in a lateral position with the neck slightly flexed; this will help the saliva drain from the mouth and prevent the tongue from falling backward - Remove all nearby furniture and other hazards from the area - Provide verbal assurance as the individual is regaining consciousness - Provide the patient with privacy if possible - Call for help and start treatment as ordered by the caregiver   fter the Seizure (Postictal Stage)  After a seizure, most patients experience confusion, fatigue, muscle pain and/or a headache. Thus, one should permit the individual to sleep. For the next few days, reassurance is essential. Being calm and helping reorient the person is also of importance.  Most seizures are painless and end spontaneously. Seizures are not harmful to others but can lead to complications such as stress on the lungs, brain and the heart. Individuals with prior lung problems may develop labored breathing and respiratory distress.   Meds ordered this encounter  Medications  . levETIRAcetam (KEPPRA) 500 MG tablet    Sig: Take 1 tablet (500 mg total) by mouth 2 (two) times  daily.    Dispense:  180 tablet    Refill:  3    Cc: Arvilla Meresaniel [name], MD  Naomie DeanAntonia Therin Vetsch, MD  [facility] [address] [place], [place] [id number]  Phone [phone] Fax [phone]

## 2020-01-07 ENCOUNTER — Telehealth (HOSPITAL_COMMUNITY): Payer: Self-pay | Admitting: Licensed Clinical Social Worker

## 2020-01-07 ENCOUNTER — Telehealth: Payer: Self-pay | Admitting: Neurology

## 2020-01-07 MED FILL — ENTRESTO 97 MG-103 MG TAB: 97-103 | 30 days supply | Qty: 60 | Fill #0

## 2020-01-07 MED FILL — SPIRONOLACTONE 25 MG TABS: 25 | 60 days supply | Qty: 30 | Fill #0

## 2020-01-07 MED FILL — levETIRAcetam 500 MG TABS: 500 | 90 days supply | Qty: 180 | Fill #1

## 2020-01-07 NOTE — Telephone Encounter (Signed)
CSW called pt to check in.  CSW first inquired about medication status.  Patient and one of patients friends, Nehemiah Settle, reviewed his pill bottles and said he is almost out of his entresto and spirolactone- pt also reports he was given a big bottle of Keppra but that someone stole it so he is almost out of that medication as well.  CSW called [employer] and requested refills on those medications- they were able to override the keppra due to it being stolen.  Pt then reported to CSW that he has been "falling out"- his friend Nehemiah Settle states that yesterday she was only there for an hour and he passed out 4 times- when he passed out he would remain unconscious for 1-2 minutes then would wake up confused and weak.  Pt has been passing out frequently over the past week per reports.  CSW called pt neurology office and updated- they will send to the physicians RN to help decide next steps to address.  CSW will continue to follow and assist as needed  Burna Sis, LCSW Clinical Social Worker [facility] Desk#: [phone] Cell#: [phone]

## 2020-01-07 NOTE — Telephone Encounter (Signed)
FYI-Jenna from the [facility] is calling in with concern of the patient and friends reporting him having syncopal episodes different times of the day , no shaking or jerking when he has the episodes just very still.   CB# [phone]

## 2020-01-07 NOTE — Telephone Encounter (Addendum)
I called the patient to discuss. He denied any falls today, but "about did a few time". The patient stated he is taking Levetiracetam 500 mg BID. He said he doesn't know if he is having seizures but he states the episodes only happen when he gets up fast like to answer the door. He gets dizzy, weird feeling and "falls out", says he is "like a tree. Timber". He is fine when he is sitting. He says he has to get up slow. Reports these episodes have been going on a few weeks. I discussed this with Dr. Lucia Gaskins, sounds orthostatic, patient should follow-up with cardiology. His recent BP at an office visit was soft at 94/60. I advised the pt to call cardiology (provided office #) to follow-up and I told him I would reach out to [place], CSW. I also advised if he is passing out multiple times an hour his friends should be calling 911. He verbalized understanding and appreciation for the call.

## 2020-01-08 ENCOUNTER — Emergency Department (HOSPITAL_COMMUNITY): Payer: Medicaid Other

## 2020-01-08 ENCOUNTER — Ambulatory Visit (HOSPITAL_COMMUNITY)
Admission: RE | Admit: 2020-01-08 | Discharge: 2020-01-08 | Disposition: A | Discharge status: Home / Self Care | Payer: Medicaid Other | Source: Ambulatory Visit | Attending: Internal Medicine | Admitting: Internal Medicine

## 2020-01-08 ENCOUNTER — Other Ambulatory Visit: Payer: Self-pay

## 2020-01-08 ENCOUNTER — Inpatient Hospital Stay (HOSPITAL_COMMUNITY)
Admission: EM | Admit: 2020-01-08 | Discharge: 2020-01-10 | DRG: 312 | Disposition: A | Discharge status: Home / Self Care | Payer: Medicaid Other | Attending: Internal Medicine | Admitting: Internal Medicine

## 2020-01-08 ENCOUNTER — Telehealth (HOSPITAL_COMMUNITY): Payer: Self-pay | Admitting: Licensed Clinical Social Worker

## 2020-01-08 ENCOUNTER — Encounter (HOSPITAL_COMMUNITY): Payer: Self-pay

## 2020-01-08 DIAGNOSIS — I10 Essential (primary) hypertension: Secondary | ICD-10-CM | POA: Diagnosis not present

## 2020-01-08 DIAGNOSIS — Z8249 Family history of ischemic heart disease and other diseases of the circulatory system: Secondary | ICD-10-CM

## 2020-01-08 DIAGNOSIS — E871 Hypo-osmolality and hyponatremia: Secondary | ICD-10-CM

## 2020-01-08 DIAGNOSIS — Z7982 Long term (current) use of aspirin: Secondary | ICD-10-CM

## 2020-01-08 DIAGNOSIS — E860 Dehydration: Secondary | ICD-10-CM | POA: Diagnosis not present

## 2020-01-08 DIAGNOSIS — E785 Hyperlipidemia, unspecified: Secondary | ICD-10-CM | POA: Diagnosis present

## 2020-01-08 DIAGNOSIS — Z20822 Contact with and (suspected) exposure to covid-19: Secondary | ICD-10-CM | POA: Diagnosis not present

## 2020-01-08 DIAGNOSIS — I110 Hypertensive heart disease with heart failure: Secondary | ICD-10-CM | POA: Diagnosis present

## 2020-01-08 DIAGNOSIS — Z95810 Presence of automatic (implantable) cardiac defibrillator: Secondary | ICD-10-CM | POA: Diagnosis present

## 2020-01-08 DIAGNOSIS — I255 Ischemic cardiomyopathy: Secondary | ICD-10-CM | POA: Diagnosis present

## 2020-01-08 DIAGNOSIS — Z9114 Patient's other noncompliance with medication regimen: Secondary | ICD-10-CM

## 2020-01-08 DIAGNOSIS — D696 Thrombocytopenia, unspecified: Secondary | ICD-10-CM | POA: Diagnosis present

## 2020-01-08 DIAGNOSIS — I2510 Atherosclerotic heart disease of native coronary artery without angina pectoris: Secondary | ICD-10-CM | POA: Diagnosis present

## 2020-01-08 DIAGNOSIS — N179 Acute kidney failure, unspecified: Secondary | ICD-10-CM | POA: Diagnosis not present

## 2020-01-08 DIAGNOSIS — I5022 Chronic systolic (congestive) heart failure: Secondary | ICD-10-CM | POA: Diagnosis present

## 2020-01-08 DIAGNOSIS — I358 Other nonrheumatic aortic valve disorders: Secondary | ICD-10-CM | POA: Diagnosis present

## 2020-01-08 DIAGNOSIS — Z79899 Other long term (current) drug therapy: Secondary | ICD-10-CM

## 2020-01-08 DIAGNOSIS — R569 Unspecified convulsions: Secondary | ICD-10-CM

## 2020-01-08 DIAGNOSIS — Z951 Presence of aortocoronary bypass graft: Secondary | ICD-10-CM

## 2020-01-08 DIAGNOSIS — I951 Orthostatic hypotension: Principal | ICD-10-CM | POA: Diagnosis present

## 2020-01-08 DIAGNOSIS — G40909 Epilepsy, unspecified, not intractable, without status epilepticus: Secondary | ICD-10-CM

## 2020-01-08 DIAGNOSIS — F1010 Alcohol abuse, uncomplicated: Secondary | ICD-10-CM | POA: Diagnosis present

## 2020-01-08 DIAGNOSIS — Z8674 Personal history of sudden cardiac arrest: Secondary | ICD-10-CM

## 2020-01-08 DIAGNOSIS — Z9181 History of falling: Secondary | ICD-10-CM

## 2020-01-08 DIAGNOSIS — R55 Syncope and collapse: Secondary | ICD-10-CM | POA: Diagnosis present

## 2020-01-08 DIAGNOSIS — Z9089 Acquired absence of other organs: Secondary | ICD-10-CM

## 2020-01-08 LAB — BASIC METABOLIC PANEL
Anion gap: 16 — ABNORMAL HIGH (ref 5–15)
BUN: 15 mg/dL (ref 6–20)
CO2: 20 mmol/L — ABNORMAL LOW (ref 22–32)
Calcium: 9.2 mg/dL (ref 8.9–10.3)
Chloride: 91 mmol/L — ABNORMAL LOW (ref 98–111)
Creatinine, Ser: 1.96 mg/dL — ABNORMAL HIGH (ref 0.61–1.24)
GFR calc Af Amer: 43 mL/min — ABNORMAL LOW (ref >60)
GFR calc non Af Amer: 37 mL/min — ABNORMAL LOW (ref >60)
Glucose, Bld: 110 mg/dL — ABNORMAL HIGH (ref 70–99)
Potassium: 4.4 mmol/L (ref 3.5–5.1)
Sodium: 127 mmol/L — ABNORMAL LOW (ref 135–145)

## 2020-01-08 LAB — LACTIC ACID, PLASMA
Lactic Acid, Venous: 2.5 mmol/L (ref 0.5–1.9)
Lactic Acid, Venous: 2.8 mmol/L (ref 0.5–1.9)

## 2020-01-08 LAB — URINALYSIS, COMPLETE (UACMP) WITH MICROSCOPIC
Bacteria, UA: NONE SEEN
Bilirubin Urine: NEGATIVE
Glucose, UA: NEGATIVE mg/dL
Hgb urine dipstick: NEGATIVE
Ketones, ur: NEGATIVE mg/dL
Leukocytes,Ua: NEGATIVE
Nitrite: NEGATIVE
Protein, ur: NEGATIVE mg/dL
Specific Gravity, Urine: 1.002 — ABNORMAL LOW (ref 1.005–1.030)
pH: 7 (ref 5.0–8.0)

## 2020-01-08 LAB — MAGNESIUM: Magnesium: 2 mg/dL (ref 1.7–2.4)

## 2020-01-08 LAB — CBC
HCT: 30.6 % — ABNORMAL LOW (ref 39.0–52.0)
Hemoglobin: 10.7 g/dL — ABNORMAL LOW (ref 13.0–17.0)
MCH: 34.5 pg — ABNORMAL HIGH (ref 26.0–34.0)
MCHC: 35 g/dL (ref 30.0–36.0)
MCV: 98.7 fL (ref 80.0–100.0)
Platelets: 126 10*3/uL — ABNORMAL LOW (ref 150–400)
RBC: 3.1 MIL/uL — ABNORMAL LOW (ref 4.22–5.81)
RDW: 11.8 % (ref 11.5–15.5)
WBC: 6.9 10*3/uL (ref 4.0–10.5)
nRBC: 0 % (ref 0.0–0.2)

## 2020-01-08 LAB — PROTIME-INR
INR: 1 (ref 0.8–1.2)
Prothrombin Time: 13.6 seconds (ref 11.4–15.2)

## 2020-01-08 LAB — OSMOLALITY, URINE: Osmolality, Ur: 147 mOsm/kg — ABNORMAL LOW (ref 300–900)

## 2020-01-08 LAB — TROPONIN I (HIGH SENSITIVITY)
Troponin I (High Sensitivity): 8 ng/L (ref <18)
Troponin I (High Sensitivity): 6 ng/L (ref <18)

## 2020-01-08 LAB — SODIUM, URINE, RANDOM: Sodium, Ur: 35 mmol/L

## 2020-01-08 LAB — SARS CORONAVIRUS 2 (TAT 6-24 HRS): SARS Coronavirus 2: NEGATIVE

## 2020-01-08 LAB — CBG MONITORING, ED: Glucose-Capillary: 107 mg/dL — ABNORMAL HIGH (ref 70–99)

## 2020-01-08 MED ORDER — ATORVASTATIN CALCIUM 80 MG PO TABS
80.0000 mg | ORAL_TABLET | Freq: Every day | ORAL | Status: DC
  Administered 2020-01-09: 80 mg via ORAL
  Filled 2020-01-08: qty 1

## 2020-01-08 MED ORDER — LEVETIRACETAM 500 MG PO TABS
500.0000 mg | ORAL_TABLET | Freq: Two times a day (BID) | ORAL | Status: DC
  Administered 2020-01-08 – 2020-01-10 (×4): 500 mg via ORAL
  Filled 2020-01-08 (×4): qty 1

## 2020-01-08 MED ORDER — HEPARIN SODIUM (PORCINE) 5000 UNIT/ML IJ SOLN
5000.0000 [IU] | Freq: Three times a day (TID) | INTRAMUSCULAR | Status: DC
  Administered 2020-01-08 – 2020-01-09 (×3): 5000 [IU] via SUBCUTANEOUS
  Filled 2020-01-08 (×3): qty 1

## 2020-01-08 MED ORDER — LACTATED RINGERS IV BOLUS
500.0000 mL | Freq: Once | INTRAVENOUS | Status: AC
  Administered 2020-01-08: 500 mL via INTRAVENOUS

## 2020-01-08 MED ORDER — SODIUM CHLORIDE 0.9 % IV SOLN: INTRAVENOUS | Status: DC

## 2020-01-08 NOTE — H&P (Signed)
History and Physical  [name] POE:[id number] DOB: 1961-01-04 DOA: 01/08/2020  Referring physician: ER provider PCP: Rejeana Brock, MD  Outpatient Specialists:    Patient coming from: Home  Chief Complaint: Falling, possible syncope versus seizure.  HPI: Patient is a 59 year old Caucasian male with past medical history significant for syncope and collapse, seizure, ischemic cardiomyopathy with EF of 35% (last echo revealed EF of 30 to 35%, impaired left ventricular relaxation and aortic sclerosis), hypertension, hyperlipidemia, alcohol abuse, coronary artery disease, history of V-fib arrest status post AICD placement and coronary artery disease.  Patient is a poor historian.  Patient tells me that he has been falling for several months.  He reported shaking earlier today.  Patient also informed me that his brother observed him seizing.  No collateral informants.  No further history obtained from patient.  No headache, no neck pain, no chest pain, no shortness of breath, no GI symptoms or urinary symptoms.    ED Course: Vitals on presentation revealed temperature of 97.8, blood pressure of 180 less than 6, respiratory rate of 15 and O2 sat of 99 percent.  Pertinent labs: Chemistry reveals sodium of 127, potassium of 4.4, chloride of 91, CO2 of 20, BUN of 15 and creatinine of 1.96.  Total bilirubin is 1.5.  AST is 35 with ALT of 29. CBC reveals WBC of 5.1, hemoglobin of 10.6, hematocrit of 29, MCV of 95.3 with platelet count of 98.  EKG: Independently reviewed.   Imaging: independently reviewed.   Review of Systems:  Negative for fever, visual changes, sore throat, rash, new muscle aches, chest pain, SOB, dysuria, bleeding, n/v/abdominal pain.  Past Medical History:  Diagnosis Date  . AICD (automatic cardioverter/defibrillator) present   . Arthritis    "hands; maybe my toes" (12/20/2017)  . CAD (coronary artery disease)   . Cardiac arrest (HCC) 11/29/2017   hx VF  arrest/notes 12/20/2017  . Coronary artery disease   . GERD (gastroesophageal reflux disease)   . H/O ETOH abuse    /notes 12/20/2017  . High cholesterol   . Hypertension   . Ischemic cardiomyopathy    a. 08/2019 s/p BSX D232 single lead AICD.  Marland Kitchen Seizure (HCC) 10/21/2019  . Seizures (HCC) ~ 2016; ~ 2017   "I've had a couple; I was at work" (12/20/2017)  . Syncope and collapse    "last few days" (12/20/2017)    Past Surgical History:  Procedure Laterality Date  . CARDIAC CATHETERIZATION    . CORONARY ARTERY BYPASS GRAFT N/A 12/07/2017   Procedure: CORONARY ARTERY BYPASS GRAFTING (CABG) times four using left internal mammary artery and right saphenous vein using endoscope for harvest.;  Surgeon: [name], MD;  Location: [facility] OR;  Service: Open Heart Surgery;  Laterality: N/A;  . IABP INSERTION N/A 11/29/2017   Procedure: IABP Insertion;  Surgeon: [name], MD;  Location: [facility] INVASIVE CV LAB;  Service: Cardiovascular;  Laterality: N/A;  . IABP INSERTION N/A 12/03/2017   Procedure: IABP INSERTION;  Surgeon: [name], MD;  Location: [facility] INVASIVE CV LAB;  Service: Cardiovascular;  Laterality: N/A;  . ICD IMPLANT N/A 08/29/2019   Procedure: ICD IMPLANT;  Surgeon: Marinus Maw, MD;  Location: [facility] INVASIVE CV LAB;  Service: Cardiovascular;  Laterality: N/A;  . LEFT HEART CATH AND CORONARY ANGIOGRAPHY N/A 11/29/2017   Procedure: LEFT HEART CATH AND CORONARY ANGIOGRAPHY;  Surgeon: [name], MD;  Location: [facility] INVASIVE CV LAB;  Service: Cardiovascular;  Laterality: N/A;  . RIGHT HEART  CATH N/A 11/29/2017   Procedure: RIGHT HEART CATH;  Surgeon: Jolaine Artist, MD;  Location: [place] CV LAB;  Service: Cardiovascular;  Laterality: N/A;  . TEE WITHOUT CARDIOVERSION N/A 12/07/2017   Procedure: TRANSESOPHAGEAL ECHOCARDIOGRAM (TEE);  Surgeon: Prescott Gum, Collier Salina, MD;  Location: [place];  Service: Open Heart Surgery;  Laterality: N/A;  . TONSILLECTOMY AND ADENOIDECTOMY  ~  1972     reports that he has never smoked. He has never used smokeless tobacco. He reports current alcohol use. He reports that he does not use drugs.  No Known Allergies  Family History  Problem Relation Age of Onset  . Heart attack Other      Prior to Admission medications   Medication Sig Start Date End Date Taking? Authorizing Provider  atorvastatin (LIPITOR) 80 MG tablet Take 1 tablet (80 mg total) by mouth daily at 6 PM. 11/24/19  Yes [name], Shaune Pascal, MD  carvedilol (COREG) 6.25 MG tablet Take 1 tablet (6.25 mg total) by mouth 2 (two) times daily with a meal. 11/24/19  Yes [name], Shaune Pascal, MD  furosemide (LASIX) 40 MG tablet Take 1 tablet (40 mg total) by mouth daily. May take extra 40mg  daily as needed for swelling 10/24/19  Yes Clegg, Amy D, NP  levETIRAcetam (KEPPRA) 500 MG tablet Take 1 tablet (500 mg total) by mouth 2 (two) times daily. 12/30/19  Yes Melvenia Beam, MD  sacubitril-valsartan (ENTRESTO) 97-103 MG Take 1 tablet by mouth 2 (two) times daily. 11/18/19  Yes Lyda Jester M, PA-C  spironolactone (ALDACTONE) 25 MG tablet Take 0.5 tablets (12.5 mg total) by mouth daily. 10/24/19  Yes Clegg, Amy D, NP    Physical Exam: Vitals:   01/08/20 1630 01/08/20 1700 01/08/20 1730 01/08/20 1900  BP: (!) 84/74 112/67 104/71 105/71  Pulse: 77 79 82 89  Resp: 19 12 12 10   Temp:      TempSrc:      SpO2: 99% 100% 98% 98%  Weight:      Height:        Constitutional:  . Appears calm and comfortable Eyes:  Marland Kitchen Mild pallor. No jaundice.  ENMT:  . external ears, nose appear normal Neck:  . Neck is supple. No JVD Respiratory:  . CTA bilaterally, no w/r/r.  . Respiratory effort normal. No retractions or accessory muscle use Cardiovascular:  . S1S2 . No LE extremity edema   Abdomen:  . Abdomen is soft and non tender. Organs are difficult to assess. Neurologic:  . Awake and alert. . Moves all limbs.  Wt Readings from Last 3 Encounters:  01/08/20 68 kg    12/30/19 68 kg  12/16/19 72 kg    I have personally reviewed following labs and imaging studies  Labs on Admission:  CBC: Recent Labs  Lab 01/08/20 1555  WBC 6.9  HGB 10.7*  HCT 30.6*  MCV 98.7  PLT 458*   Basic Metabolic Panel: Recent Labs  Lab 01/08/20 1555  NA 127*  K 4.4  CL 91*  CO2 20*  GLUCOSE 110*  BUN 15  CREATININE 1.96*  CALCIUM 9.2  MG 2.0   Liver Function Tests: No results for input(s): AST, ALT, ALKPHOS, BILITOT, PROT, ALBUMIN in the last 168 hours. No results for input(s): LIPASE, AMYLASE in the last 168 hours. No results for input(s): AMMONIA in the last 168 hours. Coagulation Profile: No results for input(s): INR, PROTIME in the last 168 hours. Cardiac Enzymes: No results for input(s): CKTOTAL, CKMB, CKMBINDEX, TROPONINI  in the last 168 hours. BNP (last 3 results) No results for input(s): PROBNP in the last 8760 hours. HbA1C: No results for input(s): HGBA1C in the last 72 hours. CBG: Recent Labs  Lab 01/08/20 1629  GLUCAP 107*   Lipid Profile: No results for input(s): CHOL, HDL, LDLCALC, TRIG, CHOLHDL, LDLDIRECT in the last 72 hours. Thyroid Function Tests: No results for input(s): TSH, T4TOTAL, FREET4, T3FREE, THYROIDAB in the last 72 hours. Anemia Panel: No results for input(s): VITAMINB12, FOLATE, FERRITIN, TIBC, IRON, RETICCTPCT in the last 72 hours. Urine analysis:    Component Value Date/Time   COLORURINE YELLOW 12/20/2017 1245   APPEARANCEUR CLEAR 12/20/2017 1245   LABSPEC 1.003 (L) 12/20/2017 1245   PHURINE 6.0 12/20/2017 1245   GLUCOSEU NEGATIVE 12/20/2017 1245   HGBUR NEGATIVE 12/20/2017 1245   BILIRUBINUR NEGATIVE 12/20/2017 1245   KETONESUR NEGATIVE 12/20/2017 1245   PROTEINUR NEGATIVE 12/20/2017 1245   NITRITE NEGATIVE 12/20/2017 1245   LEUKOCYTESUR NEGATIVE 12/20/2017 1245   Sepsis Labs: @LABRCNTIP (procalcitonin:4,lacticidven:4) )No results found for this or any previous visit (from the past 240 hour(s)).     Radiological Exams on Admission: DG Chest 1 View  Result Date: 01/08/2020 CLINICAL DATA:  Syncope EXAM: CHEST  1 VIEW COMPARISON:  12/08/2019 FINDINGS: Post sternotomy changes. Similar appearance of left-sided pacing device. Increasing airspace disease at the left base. Right lung is clear. Stable cardiomediastinal silhouette. No pneumothorax. Old appearing bilateral rib fractures. IMPRESSION: Increasing airspace disease at left base, favor atelectasis though developing infiltrate is also possible. Electronically Signed   By: 12/10/2019 M.D.   On: 01/08/2020 16:45    EKG: Independently reviewed.   Active Problems:   Syncope   Assessment/Plan Syncope/possible fall/orthostatic hypotension: Admit patient for further assessment and management. Patient is a poor historian. Continue to assess and obtain more history from patient. Syncope work-up Consult PT OT eventually. Check orthostasis.  Hyponatremia: Etiology unclear. Check urine sodium, urine osmolality, serum osmolality. Check cortisol and TSH. Monitor renal function closely. Further management will depend on hospital course.  Thrombocytopenia: Likely secondary to chronic liver disease.  Acute kidney injury: Serum creatinine is 1.96. Hold Coreg, Lasix, Entresto and Aldactone. AKI work-up.  DVT prophylaxis: Subcu Lovenox Code Status: Full code Family Communication:  Disposition Plan: Home eventually Consults called: None Admission status: Observation  Time spent: 65 minutes  01/10/2020, MD  [facility] Pager #: [phone] 7PM-7AM contact night coverage as above  01/08/2020, 7:23 PM

## 2020-01-08 NOTE — ED Provider Notes (Signed)
[place] EMERGENCY DEPARTMENT Provider Note   CSN: [id number] Arrival date & time: 01/08/20  1523     History Chief Complaint  Patient presents with  . Loss of Consciousness  . Dizziness    [name] is a 59 y.o. male.  HPI 59 year old male with extensive past medical history including ischemic cardiomyopathy, CAD, history of V. fib arrest, seizure disorder currently on Keppra, history of alcohol withdrawal and alcohol abuse, ICD placement, CHF with a EF of 25%, presenting to the emergency department for syncopal episodes over the past 3 days.  Patient's sister states that he lives alone, and states that he does not tell him members about his health very often.  Patient states that he has chronic shortness of breath on exertion, however states that whenever he sits up too quickly to answer the door go to the bathroom he passes out.  No seizure activity that is witnessed, denies any chest pain that is new, denies any abdominal pain, nausea or vomiting, no fevers or chills, no recent illnesses.  Patient had a recent admission in the hospital in December for similar symptoms, had his medications adjusted, and his syncopal events were thought secondary to volume depletion.  Denies any headaches or head trauma, no loss of consciousness, no injuries when falling.  Patient states that he had recent changes to his medications back in December, however denies knowing exactly what they were.    Past Medical History:  Diagnosis Date  . AICD (automatic cardioverter/defibrillator) present   . Arthritis    "hands; maybe my toes" (12/20/2017)  . CAD (coronary artery disease)   . Cardiac arrest ([place]) 11/29/2017   hx VF arrest/notes 12/20/2017  . Coronary artery disease   . GERD (gastroesophageal reflux disease)   . H/O ETOH abuse    /notes 12/20/2017  . High cholesterol   . Hypertension   . Ischemic cardiomyopathy    a. 08/2019 s/p BSX D232 single lead AICD.  Marland Kitchen  Seizure ([place]) 10/21/2019  . Seizures ([place]) ~ 2016; ~ 2017   "I've had a couple; I was at work" (12/20/2017)  . Syncope and collapse    "last few days" (12/20/2017)    Patient Active Problem List   Diagnosis Date Noted  . 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, 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 ([place]) 12/30/2019  . Seizure ([place]) 10/21/2019  . ICD (implantable cardioverter-defibrillator) in place 08/29/2019  . Chronic systolic heart failure ([place]) 08/25/2019  . Orthostatic hypotension 12/21/2017  . Syncope 12/20/2017  . 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 Coronary artery disease 12/07/2017  . Aspiration pneumonia [facility])     Past Surgical History:  Procedure Laterality Date  . CARDIAC CATHETERIZATION    . CORONARY ARTERY BYPASS GRAFT N/A 12/07/2017   Procedure: CORONARY ARTERY BYPASS GRAFTING (CABG) times four using left internal mammary artery and right saphenous vein using endoscope for harvest.;  Surgeon: Ivin Poot, MD;  Location: [place];  Service: Open Heart Surgery;  Laterality: N/A;  . IABP INSERTION N/A 11/29/2017   Procedure: IABP Insertion;  Surgeon: Nelva Bush, MD;  Location: [place] CV LAB;  Service: Cardiovascular;  Laterality: N/A;  . IABP INSERTION N/A 12/03/2017   Procedure: IABP INSERTION;  Surgeon: Jolaine Artist, MD;  Location: [place] CV LAB;  Service: Cardiovascular;  Laterality: N/A;  . ICD IMPLANT N/A 08/29/2019   Procedure: ICD IMPLANT;  Surgeon: Evans Lance, MD;  Location: [place] CV LAB;  Service: Cardiovascular;  Laterality: N/A;  . LEFT HEART CATH AND CORONARY ANGIOGRAPHY N/A 11/29/2017   Procedure: LEFT HEART CATH AND CORONARY ANGIOGRAPHY;  Surgeon: [name], MD;  Location: [facility] INVASIVE CV LAB;  Service: Cardiovascular;  Laterality: N/A;  . RIGHT HEART CATH N/A 11/29/2017   Procedure: RIGHT HEART CATH;  Surgeon: [name], MD;  Location: [facility] INVASIVE CV LAB;  Service: Cardiovascular;  Laterality:  N/A;  . TEE WITHOUT CARDIOVERSION N/A 12/07/2017   Procedure: TRANSESOPHAGEAL ECHOCARDIOGRAM (TEE);  Surgeon: Donata Clay, Theron Arista, MD;  Location: [facility] OR;  Service: Open Heart Surgery;  Laterality: N/A;  . TONSILLECTOMY AND ADENOIDECTOMY  ~ 1972       Family History  Problem Relation Age of Onset  . Heart attack Other     Social History   Tobacco Use  . Smoking status: Never Smoker  . Smokeless tobacco: Never Used  Substance Use Topics  . Alcohol use: Yes    Alcohol/week: 0.0 - 42.0 standard drinks    Comment: 12/20/2017 "I quit drinking after I had the heart attack"  . Drug use: No    Home Medications Prior to Admission medications   Medication Sig Start Date End Date Taking? Authorizing Provider  atorvastatin (LIPITOR) 80 MG tablet Take 1 tablet (80 mg total) by mouth daily at 6 PM. 11/24/19  Yes [name], Bevelyn Buckles, MD  carvedilol (COREG) 6.25 MG tablet Take 1 tablet (6.25 mg total) by mouth 2 (two) times daily with a meal. 11/24/19  Yes [name], Bevelyn Buckles, MD  furosemide (LASIX) 40 MG tablet Take 1 tablet (40 mg total) by mouth daily. May take extra 40mg  daily as needed for swelling 10/24/19  Yes Clegg, Amy D, NP  levETIRAcetam (KEPPRA) 500 MG tablet Take 1 tablet (500 mg total) by mouth 2 (two) times daily. 12/30/19  Yes 02/27/20, MD  sacubitril-valsartan (ENTRESTO) 97-103 MG Take 1 tablet by mouth 2 (two) times daily. 11/18/19  Yes 11/20/19 M, PA-C  spironolactone (ALDACTONE) 25 MG tablet Take 0.5 tablets (12.5 mg total) by mouth daily. 10/24/19  Yes Clegg, Amy D, NP    Allergies    Patient has no known allergies.  Review of Systems   Review of Systems  Constitutional: Positive for fatigue. Negative for chills and fever.  HENT: Negative for ear pain and sore throat.   Eyes: Negative for pain and visual disturbance.  Respiratory: Negative for cough and shortness of breath.   Cardiovascular: Negative for chest pain and palpitations.  Gastrointestinal:  Negative for abdominal pain and vomiting.  Genitourinary: Negative for dysuria and hematuria.  Musculoskeletal: Negative for arthralgias and back pain.  Skin: Negative for color change and rash.  Neurological: Positive for syncope. Negative for seizures.  All other systems reviewed and are negative.   Physical Exam Updated Vital Signs BP (!) 90/52 (BP Location: Left Arm)   Pulse 93   Temp 98.1 F (36.7 C) (Oral)   Resp 18   Ht 5\' 10"  (1.778 m)   Wt 68 kg   SpO2 97%   BMI 21.52 kg/m   Physical Exam Vitals and nursing note reviewed.  Constitutional:      General: He is not in acute distress.    Appearance: He is well-developed. He is not ill-appearing, toxic-appearing or diaphoretic.  HENT:     Head: Normocephalic and atraumatic.     Right Ear: External ear normal.     Left Ear: External ear normal.     Nose: Nose normal.  No congestion.     Mouth/Throat:     Mouth: Mucous membranes are moist.     Pharynx: Oropharynx is clear.  Eyes:     Conjunctiva/sclera: Conjunctivae normal.  Cardiovascular:     Rate and Rhythm: Normal rate and regular rhythm.     Heart sounds: No murmur.  Pulmonary:     Effort: Pulmonary effort is normal. No respiratory distress.     Breath sounds: Normal breath sounds.  Abdominal:     Palpations: Abdomen is soft.     Tenderness: There is no abdominal tenderness. There is no guarding or rebound.     Hernia: No hernia is present.  Musculoskeletal:        General: No swelling or tenderness.     Cervical back: Neck supple.  Skin:    General: Skin is warm and dry.     Capillary Refill: Capillary refill takes less than 2 seconds.     Coloration: Skin is not jaundiced or pale.     Findings: No bruising, erythema, lesion or rash.  Neurological:     General: No focal deficit present.     Mental Status: He is alert and oriented to person, place, and time.  Psychiatric:        Mood and Affect: Mood normal.        Behavior: Behavior normal.     ED  Results / Procedures / Treatments   Labs (all labs ordered are listed, but only abnormal results are displayed) Labs Reviewed  BASIC METABOLIC PANEL - Abnormal; Notable for the following components:      Result Value   Sodium 127 (*)    Chloride 91 (*)    CO2 20 (*)    Glucose, Bld 110 (*)    Creatinine, Ser 1.96 (*)    GFR calc non Af Amer 37 (*)    GFR calc Af Amer 43 (*)    Anion gap 16 (*)    All other components within normal limits  CBC - Abnormal; Notable for the following components:   RBC 3.10 (*)    Hemoglobin 10.7 (*)    HCT 30.6 (*)    MCH 34.5 (*)    Platelets 126 (*)    All other components within normal limits  LACTIC ACID, PLASMA - Abnormal; Notable for the following components:   Lactic Acid, Venous 2.5 (*)    All other components within normal limits  LACTIC ACID, PLASMA - Abnormal; Notable for the following components:   Lactic Acid, Venous 2.8 (*)    All other components within normal limits  CBG MONITORING, ED - Abnormal; Notable for the following components:   Glucose-Capillary 107 (*)    All other components within normal limits  SARS CORONAVIRUS 2 (TAT 6-24 HRS)  MAGNESIUM  URINALYSIS, ROUTINE W REFLEX MICROSCOPIC  TROPONIN I (HIGH SENSITIVITY)  TROPONIN I (HIGH SENSITIVITY)    EKG None  Radiology DG Chest 1 View  Result Date: 01/08/2020 CLINICAL DATA:  Syncope EXAM: CHEST  1 VIEW COMPARISON:  12/08/2019 FINDINGS: Post sternotomy changes. Similar appearance of left-sided pacing device. Increasing airspace disease at the left base. Right lung is clear. Stable cardiomediastinal silhouette. No pneumothorax. Old appearing bilateral rib fractures. IMPRESSION: Increasing airspace disease at left base, favor atelectasis though developing infiltrate is also possible. Electronically Signed   By: Jasmine Pang M.D.   On: 01/08/2020 16:45    Procedures Procedures (including critical care time)  Medications Ordered in ED Medications  lactated ringers  bolus 500 mL (0 mLs Intravenous Stopped 01/08/20 1723)  lactated ringers bolus 500 mL (0 mLs Intravenous Stopped 01/08/20 1737)    ED Course  I have reviewed the triage vital signs and the nursing notes.  Pertinent labs & imaging results that were available during my care of the patient were reviewed by me and considered in my medical decision making (see chart for details).    MDM Rules/Calculators/A&P                      59 year old male with extensive cardiac history presenting to the emergency department for syncopal episodes.  Patient on arrival was hypotensive in the low 90s and upper 80s systolic, mentating well, perfusing well.  No signs of JVD or fluid overload on my exam.  Patient given very small 500 cc bolus, ran slowly given his EF.  EKG was obtained with normal sinus rhythm, no signs of acute ischemic changes, no arrhythmias noted.  Given his hypotension as well as his history, concern for orthostatic syncope, especially given his history of CHF and currently on diuretics.  Spoke with family who also states that he does not eat or drink well, and he is a alcoholic.  Last drink was today per family who states that he drinks daily.  No chest pain, doubt ACS, however given his history will obtain troponin.  Chest x-ray as well as basic labs are obtained, magnesium, ICD was also interrogated.  Patient is either over diuresed, or volume down, doubt neurogenic syncope or need for CT head at this time.  EKG reassuring, no acute ischemic changes or arrhythmias, patient has AKI as well as hyponatremia, gap acidosis, likely secondary to either alcohol versus ketones.  Not diabetic, glucose normal, doubt DKA.  Likely secondary to volume depletion, given small amount of fluid bolus with improvement in his blood pressure.  Given his lab values as well as his recurrent syncope, patient will be admitted to the hospital for further evaluation and management with cardiology managing as well, cardiology  was consulted and they recommend admission to the hospitalist service for continued rehydration, as well as management of his medications and alcoholism.  The attending physician was present and available for all medical decision making and procedures related to this patient's care.    Final Clinical Impression(s) / ED Diagnoses Final diagnoses:  Dehydration  Syncope and collapse    Rx / DC Orders ED Discharge Orders    None       Jamey Reas, MD 01/08/20 Eppie Gibson, MD 01/08/20 2252

## 2020-01-08 NOTE — ED Triage Notes (Signed)
Pt sent here by Child psychotherapist and neurologist for further evaluation of syncopal episodes and dizziness that has been going on for the past 2 days. Pt has no complaints at this time.

## 2020-01-08 NOTE — Telephone Encounter (Signed)
CSW received message from Neuro office stating that after speaking with pt they believe him passing out is do to being orthostatic and recommends him seeing cardiologist.  CSW discussed with triage staff and they can get pt in this afternoon at 2:30pm- pt agreeable  CSW set up transport for this afternoon  CSW will continue to follow and assist as needed  Burna Sis, LCSW Clinical Social Worker [facility] Desk#: [phone] Cell#: [phone]

## 2020-01-09 ENCOUNTER — Encounter (HOSPITAL_COMMUNITY): Payer: Self-pay | Admitting: Internal Medicine

## 2020-01-09 ENCOUNTER — Observation Stay (HOSPITAL_COMMUNITY): Payer: Medicaid Other

## 2020-01-09 DIAGNOSIS — R569 Unspecified convulsions: Secondary | ICD-10-CM

## 2020-01-09 DIAGNOSIS — R55 Syncope and collapse: Secondary | ICD-10-CM

## 2020-01-09 DIAGNOSIS — I2510 Atherosclerotic heart disease of native coronary artery without angina pectoris: Secondary | ICD-10-CM | POA: Diagnosis not present

## 2020-01-09 DIAGNOSIS — Z9114 Patient's other noncompliance with medication regimen: Secondary | ICD-10-CM | POA: Diagnosis not present

## 2020-01-09 DIAGNOSIS — G40909 Epilepsy, unspecified, not intractable, without status epilepticus: Secondary | ICD-10-CM | POA: Diagnosis present

## 2020-01-09 DIAGNOSIS — I358 Other nonrheumatic aortic valve disorders: Secondary | ICD-10-CM | POA: Diagnosis present

## 2020-01-09 DIAGNOSIS — Z9181 History of falling: Secondary | ICD-10-CM | POA: Diagnosis not present

## 2020-01-09 DIAGNOSIS — F1010 Alcohol abuse, uncomplicated: Secondary | ICD-10-CM | POA: Diagnosis present

## 2020-01-09 DIAGNOSIS — D696 Thrombocytopenia, unspecified: Secondary | ICD-10-CM | POA: Diagnosis present

## 2020-01-09 DIAGNOSIS — Z20822 Contact with and (suspected) exposure to covid-19: Secondary | ICD-10-CM | POA: Diagnosis not present

## 2020-01-09 DIAGNOSIS — Z95810 Presence of automatic (implantable) cardiac defibrillator: Secondary | ICD-10-CM

## 2020-01-09 DIAGNOSIS — E785 Hyperlipidemia, unspecified: Secondary | ICD-10-CM | POA: Diagnosis present

## 2020-01-09 DIAGNOSIS — I5022 Chronic systolic (congestive) heart failure: Secondary | ICD-10-CM | POA: Diagnosis not present

## 2020-01-09 DIAGNOSIS — I255 Ischemic cardiomyopathy: Secondary | ICD-10-CM | POA: Diagnosis present

## 2020-01-09 DIAGNOSIS — Z8674 Personal history of sudden cardiac arrest: Secondary | ICD-10-CM | POA: Diagnosis not present

## 2020-01-09 DIAGNOSIS — E860 Dehydration: Secondary | ICD-10-CM | POA: Diagnosis not present

## 2020-01-09 DIAGNOSIS — E871 Hypo-osmolality and hyponatremia: Secondary | ICD-10-CM | POA: Diagnosis present

## 2020-01-09 DIAGNOSIS — Z7982 Long term (current) use of aspirin: Secondary | ICD-10-CM | POA: Diagnosis not present

## 2020-01-09 DIAGNOSIS — Z79899 Other long term (current) drug therapy: Secondary | ICD-10-CM | POA: Diagnosis not present

## 2020-01-09 DIAGNOSIS — N179 Acute kidney failure, unspecified: Secondary | ICD-10-CM

## 2020-01-09 DIAGNOSIS — I951 Orthostatic hypotension: Secondary | ICD-10-CM | POA: Diagnosis present

## 2020-01-09 DIAGNOSIS — I10 Essential (primary) hypertension: Secondary | ICD-10-CM | POA: Diagnosis not present

## 2020-01-09 DIAGNOSIS — Z9089 Acquired absence of other organs: Secondary | ICD-10-CM | POA: Diagnosis not present

## 2020-01-09 DIAGNOSIS — I110 Hypertensive heart disease with heart failure: Secondary | ICD-10-CM | POA: Diagnosis present

## 2020-01-09 DIAGNOSIS — Z951 Presence of aortocoronary bypass graft: Secondary | ICD-10-CM | POA: Diagnosis not present

## 2020-01-09 DIAGNOSIS — Z8249 Family history of ischemic heart disease and other diseases of the circulatory system: Secondary | ICD-10-CM | POA: Diagnosis not present

## 2020-01-09 LAB — COMPREHENSIVE METABOLIC PANEL
ALT: 21 U/L (ref 0–44)
AST: 28 U/L (ref 15–41)
Albumin: 3.5 g/dL (ref 3.5–5.0)
Alkaline Phosphatase: 76 U/L (ref 38–126)
Anion gap: 12 (ref 5–15)
BUN: 15 mg/dL (ref 6–20)
CO2: 23 mmol/L (ref 22–32)
Calcium: 9.1 mg/dL (ref 8.9–10.3)
Chloride: 96 mmol/L — ABNORMAL LOW (ref 98–111)
Creatinine, Ser: 1.43 mg/dL — ABNORMAL HIGH (ref 0.61–1.24)
GFR calc Af Amer: >60 mL/min (ref >60)
GFR calc non Af Amer: 54 mL/min — ABNORMAL LOW (ref >60)
Glucose, Bld: 93 mg/dL (ref 70–99)
Potassium: 4.6 mmol/L (ref 3.5–5.1)
Sodium: 131 mmol/L — ABNORMAL LOW (ref 135–145)
Total Bilirubin: 1.9 mg/dL — ABNORMAL HIGH (ref 0.3–1.2)
Total Protein: 6.5 g/dL (ref 6.5–8.1)

## 2020-01-09 LAB — HEPATIC FUNCTION PANEL
ALT: 23 U/L (ref 0–44)
AST: 29 U/L (ref 15–41)
Albumin: 3.5 g/dL (ref 3.5–5.0)
Alkaline Phosphatase: 75 U/L (ref 38–126)
Bilirubin, Direct: 0.3 mg/dL — ABNORMAL HIGH (ref 0.0–0.2)
Indirect Bilirubin: 1.2 mg/dL — ABNORMAL HIGH (ref 0.3–0.9)
Total Bilirubin: 1.5 mg/dL — ABNORMAL HIGH (ref 0.3–1.2)
Total Protein: 6.5 g/dL (ref 6.5–8.1)

## 2020-01-09 LAB — CREATININE, SERUM
Creatinine, Ser: 1.43 mg/dL — ABNORMAL HIGH (ref 0.61–1.24)
GFR calc Af Amer: >60 mL/min (ref >60)
GFR calc non Af Amer: 54 mL/min — ABNORMAL LOW (ref >60)

## 2020-01-09 LAB — CBC
HCT: 29.8 % — ABNORMAL LOW (ref 39.0–52.0)
HCT: 32.8 % — ABNORMAL LOW (ref 39.0–52.0)
Hemoglobin: 10.6 g/dL — ABNORMAL LOW (ref 13.0–17.0)
Hemoglobin: 11.6 g/dL — ABNORMAL LOW (ref 13.0–17.0)
MCH: 33.9 pg (ref 26.0–34.0)
MCH: 34.1 pg — ABNORMAL HIGH (ref 26.0–34.0)
MCHC: 35.6 g/dL (ref 30.0–36.0)
MCHC: 35.4 g/dL (ref 30.0–36.0)
MCV: 95.2 fL (ref 80.0–100.0)
MCV: 96.5 fL (ref 80.0–100.0)
Platelets: 98 10*3/uL — ABNORMAL LOW (ref 150–400)
Platelets: 97 10*3/uL — ABNORMAL LOW (ref 150–400)
RBC: 3.13 MIL/uL — ABNORMAL LOW (ref 4.22–5.81)
RBC: 3.4 MIL/uL — ABNORMAL LOW (ref 4.22–5.81)
RDW: 11.8 % (ref 11.5–15.5)
RDW: 11.8 % (ref 11.5–15.5)
WBC: 5.1 10*3/uL (ref 4.0–10.5)
WBC: 5.1 10*3/uL (ref 4.0–10.5)
nRBC: 0 % (ref 0.0–0.2)
nRBC: 0 % (ref 0.0–0.2)

## 2020-01-09 LAB — CORTISOL: Cortisol, Plasma: 9.1 ug/dL

## 2020-01-09 LAB — MAGNESIUM
Magnesium: 1.8 mg/dL (ref 1.7–2.4)
Magnesium: 1.9 mg/dL (ref 1.7–2.4)

## 2020-01-09 LAB — ECHOCARDIOGRAM COMPLETE
Height: 70 in
Weight: 2400 oz

## 2020-01-09 LAB — OSMOLALITY: Osmolality: 267 mOsm/kg — ABNORMAL LOW (ref 275–295)

## 2020-01-09 LAB — TSH: TSH: 1.774 u[IU]/mL (ref 0.350–4.500)

## 2020-01-09 LAB — PHOSPHORUS: Phosphorus: 3.7 mg/dL (ref 2.5–4.6)

## 2020-01-09 MED ORDER — CARVEDILOL 3.125 MG PO TABS
3.1250 mg | ORAL_TABLET | Freq: Two times a day (BID) | ORAL | Status: DC
  Administered 2020-01-09 – 2020-01-10 (×2): 3.125 mg via ORAL
  Filled 2020-01-09 (×2): qty 1

## 2020-01-09 MED ORDER — LORAZEPAM 2 MG/ML IJ SOLN: 1.0000 mg | INTRAMUSCULAR | Status: DC | PRN

## 2020-01-09 MED ORDER — THIAMINE HCL 100 MG/ML IJ SOLN: 100.0000 mg | Freq: Every day | INTRAMUSCULAR | Status: DC

## 2020-01-09 MED ORDER — ADULT MULTIVITAMIN W/MINERALS CH
1.0000 | ORAL_TABLET | Freq: Every day | ORAL | Status: DC
  Administered 2020-01-09 – 2020-01-10 (×2): 1 via ORAL
  Filled 2020-01-09 (×2): qty 1

## 2020-01-09 MED ORDER — SODIUM CHLORIDE 0.9 % IV SOLN: INTRAVENOUS | Status: AC

## 2020-01-09 MED ORDER — FOLIC ACID 1 MG PO TABS
1.0000 mg | ORAL_TABLET | Freq: Every day | ORAL | Status: DC
  Administered 2020-01-09 – 2020-01-10 (×2): 1 mg via ORAL
  Filled 2020-01-09 (×2): qty 1

## 2020-01-09 MED ORDER — LORAZEPAM 1 MG PO TABS: 1.0000 mg | ORAL_TABLET | ORAL | Status: DC | PRN

## 2020-01-09 MED ORDER — THIAMINE HCL 100 MG PO TABS
100.0000 mg | ORAL_TABLET | Freq: Every day | ORAL | Status: DC
  Administered 2020-01-09 – 2020-01-10 (×2): 100 mg via ORAL
  Filled 2020-01-09 (×2): qty 1

## 2020-01-09 MED ORDER — ASPIRIN EC 81 MG PO TBEC
81.0000 mg | DELAYED_RELEASE_TABLET | Freq: Every day | ORAL | Status: DC
  Administered 2020-01-09 – 2020-01-10 (×2): 81 mg via ORAL
  Filled 2020-01-09 (×2): qty 1

## 2020-01-09 NOTE — Evaluation (Signed)
Physical Therapy Evaluation Patient Details Name: [name] MRN: [id number] DOB: [birth year] Today's Date: 01/09/2020   History of Present Illness  Patient is a 59 year old Caucasian male with past medical history significant for syncope and collapse, seizure, ischemic cardiomyopathy with EF of 35% (last echo revealed EF of 30 to 35%, impaired left ventricular relaxation and aortic sclerosis), hypertension, hyperlipidemia, alcohol abuse, coronary artery disease, history of V-fib arrest status post AICD placement and coronary artery disease. Pt admitted with frequent falls, with syncope work up. Pt also found to have hyponatremia and AKI.  Clinical Impression  Pt admitted with above diagnosis. Prior to admission, pt lives with his brother, is independent with ADL's and mobility, however endorses frequent falling. On PT evaluation, pt presents with balance deficits and increased tremulousness with activity. Has positive orthostatics with transition from sitting to standing; pt asymptomatic (see vitals flow sheet). Pt presents as a high fall risk based on history of falls, decreased safety awareness and decreased gait speed. Pt currently with functional limitations due to the deficits listed below (see PT Problem List). Pt will benefit from skilled PT to increase their independence and safety with mobility to allow discharge to the venue listed below.       Follow Up Recommendations Home health PT;Supervision - Intermittent    Equipment Recommendations  Rolling walker with 5" wheels    Recommendations for Other Services       Precautions / Restrictions Precautions Precautions: Fall;Other (comment) Precaution Comments: orthostatic Restrictions Weight Bearing Restrictions: No      Mobility  Bed Mobility Overal bed mobility: Modified Independent                Transfers Overall transfer level: Needs assistance Equipment used: None Transfers: Sit to/from Stand Sit to  Stand: Supervision            Ambulation/Gait Ambulation/Gait assistance: Min guard Gait Distance (Feet): 100 Feet Assistive device: Rolling walker (2 wheeled) Gait Pattern/deviations: Step-through pattern;Trunk flexed Gait velocity: decreased   General Gait Details: Pt with large step length, poor proximity to walker, increased tremulousness requiring min guard assist  Stairs            Wheelchair Mobility    Modified Rankin (Stroke Patients Only)       Balance Overall balance assessment: Needs assistance Sitting-balance support: Feet supported Sitting balance-Leahy Scale: Good     Standing balance support: No upper extremity supported;During functional activity Standing balance-Leahy Scale: Fair                               Pertinent Vitals/Pain Pain Assessment: No/denies pain    Home Living Family/patient expects to be discharged to:: Private residence Living Arrangements: Other relatives(brother)   Type of Home: House Home Access: Stairs to enter   [employer] of Steps: 2 Home Layout: One level Home Equipment: Cane - single point      Prior Function Level of Independence: Independent         Comments: Pt does not drive, reports he rides his bike or walks to get his groceries.     Hand Dominance        Extremity/Trunk Assessment   Upper Extremity Assessment Upper Extremity Assessment: Overall WFL for tasks assessed    Lower Extremity Assessment Lower Extremity Assessment: Overall WFL for tasks assessed       Communication   Communication: No difficulties  Cognition Arousal/Alertness: Awake/alert Behavior During Therapy: [facility]  for tasks assessed/performed Overall Cognitive Status: Impaired/Different from baseline Area of Impairment: Safety/judgement                         Safety/Judgement: Decreased awareness of safety;Decreased awareness of deficits            General Comments       Exercises     Assessment/Plan    PT Assessment Patient needs continued PT services  PT Problem List Decreased balance;Decreased mobility;Decreased safety awareness       PT Treatment Interventions DME instruction;Gait training;Stair training;Therapeutic exercise;Functional mobility training;Therapeutic activities;Balance training;Patient/family education    PT Goals (Current goals can be found in the Care Plan section)  Acute Rehab PT Goals Patient Stated Goal: "not be shaky." PT Goal Formulation: With patient Time For Goal Achievement: 01/23/20 Potential to Achieve Goals: Good    Frequency Min 3X/week   Barriers to discharge        Co-evaluation               AM-PAC PT "6 Clicks" Mobility  Outcome Measure Help needed turning from your back to your side while in a flat bed without using bedrails?: None Help needed moving from lying on your back to sitting on the side of a flat bed without using bedrails?: None Help needed moving to and from a bed to a chair (including a wheelchair)?: None Help needed standing up from a chair using your arms (e.g., wheelchair or bedside chair)?: None Help needed to walk in hospital room?: A Little Help needed climbing 3-5 steps with a railing? : A Little 6 Click Score: 22    End of Session Equipment Utilized During Treatment: Gait belt Activity Tolerance: Patient tolerated treatment well Patient left: in bed;with call bell/phone within reach;with bed alarm set Nurse Communication: Mobility status PT Visit Diagnosis: Unsteadiness on feet (R26.81);History of falling (Z91.81);Difficulty in walking, not elsewhere classified (R26.2)    Time: 5188-4166 PT Time Calculation (min) (ACUTE ONLY): 17 min   Charges:   PT Evaluation $PT Eval Moderate Complexity: 1 Mod          Ellamae Sia, [place], DPT Acute Rehabilitation Services Pager [phone] Office [phone]   Willy Eddy 01/09/2020, 12:39 PM

## 2020-01-09 NOTE — Progress Notes (Signed)
CSW follows in outpatient setting through the [facility].  CSW met with [name] at bedside to check in- [name] reports feeling better today- hopeful to go home soon if possible.   CSW reached out to [facility] team and informed of involvement- I continue to be available to answer any questions about his home life and help to clarify patient situation as he is a poor historian.  Also let TOC team know that he will need a ride home when he is discharged as he does not have access to transportation and does not have anyone who can pick him up.  CSW will continue to follow and in the outpatient setting.  [name], LCSW Clinical Social Worker [facility] Desk#: [phone] Cell#: [phone]

## 2020-01-09 NOTE — Progress Notes (Signed)
EEG Completed; Results Pending  

## 2020-01-09 NOTE — Progress Notes (Signed)
  Echocardiogram 2D Echocardiogram has been performed.  [name] 01/09/2020, 2:06 PM

## 2020-01-09 NOTE — Procedures (Signed)
Patient Name: [name]  MRN: [id number]  Epilepsy Attending: Charlsie Quest  Referring Physician/Provider: Toya Smothers, NP Date: 01/09/2020  Duration: 23.10 mins  Patient history: 59yo M with h/o seizures who presented with syncope. EEG to evaluate for seizure.  Level of alertness: awake, drowsy  AEDs during EEG study: LEV  Technical aspects: This EEG study was done with scalp electrodes positioned according to the 10-20 International system of electrode placement. Electrical activity was acquired at a sampling rate of 500Hz  and reviewed with a high frequency filter of 70Hz  and a low frequency filter of 1Hz . EEG data were recorded continuously and digitally stored.   DESCRIPTION:  The posterior dominant rhythm consists of 8-9 Hz activity of moderate voltage (25-35 uV) seen predominantly in posterior head regions, symmetric and reactive to eye opening and eye closing.  Drowsiness was characterized by attenuation of the posterior background rhythm. Physiologic photic driving was seen during photic stimulation. No EEG change was seen during hyperventilation.  IMPRESSION: This study is within normal limits. No seizures or epileptiform discharges were seen throughout the recording.However, only wakefulness and drowsiness were recorded. If suspicion for interictal activity remains a concern, a prolonged study including sleep should be considered.   [name] 

## 2020-01-09 NOTE — Progress Notes (Signed)
[facility] PROGRESS NOTE  [name] RPR:[id number] DOB: 02/02/1961 DOA: 01/08/2020 PCP: No primary care provider on file.  Assessment/Plan: Syncope/possible fall/orthostatic hypotension: Some question as to whether or not patient syncopized or had a seizure.  He is a terrible historian.  Chart review does indicate reports of syncopal events per his roommate.  Blood pressure was quite low and he was orthostatic upon presentation.  However he also describes frequent episodes and "sometimes I shake all over" before he has his episode.  IV fluids were initiated.  Orthostatic vital signs repeated this morning and he remains orthostatic.  Of note chart review indicates similar presentations in the past. -We will obtain 2D echo -EEG (he has hx of seizure) -Gentle IV fluids -Home health PT per physical therapy -Check orthostasis in the a.m. -request HF team consult  Hyponatremia: Etiology unclear.  Improved today.  This morning his sodium is 131 up from 127 on admission.  He reports a good appetite however information from patient is unreliable.  Serum osmolality 267, urine osmolality 147, urine sodium within the limits of normal.  Cortisol and TSH within the limits of normal. -Gentle IV fluids as noted above -Monitor  Acute kidney injury: Creatinine trending down this a.m.  Serum creatinine is 1.96 on admission.  Home medications include Coreg, Lasix, Entresto and Aldactone. -Continue to hold Coreg Lasix Entresto and Aldactone -Very gentle IV fluids -Monitor urine output -Recheck in the morning  Chronic systolic heart failure with ICM.  Echo in June 2020 reveals an EF of 30 to 35%, moderate to severely reduced systolic function.  Followed by Dr. Gala Romney.  Home meds include Coreg Lasix Entresto Aldactone.  He does not appear to be volume overloaded.  He is quite orthostatic.  Unclear if these episodes are seizure or syncopal.  He describes seizure-like activity chart review  indicates witnesses describe more syncopal episodes.  May be a combination.  Reports compliance with his medications. -Monitor intake and output -Obtain daily weights -Have requested a consult from heart failure team -Continue to hold home meds for now.  CAD status post CABG in 2018.  Denies chest pain.  EKG without acute changes. -Continue home aspirin  EtOH use. -CIWA protocol  History of seizure disorder.  Home medications include Keppra.  Patient describes some seizure-like activity prior to "episodes". -EEG as noted above -Continue Keppra  Code Status: full Family Communication: full Disposition Plan: home when ready   Consultants:  [facility] (heart failure team)  Procedures:    Antibiotics:    HPI/Subjective: Awake alert oriented to self and place.  Denies pain or discomfort  Objective: Vitals:   01/08/20 2134 01/09/20 0628  BP: 108/76 (!) 133/92  Pulse: 89 95  Resp: 15 16  Temp: 97.8 F (36.6 C) 98.5 F (36.9 C)  SpO2: 99% 99%    Intake/Output Summary (Last 24 hours) at 01/09/2020 1304 Last data filed at 01/09/2020 4462 Gross per 24 hour  Intake 1401.46 ml  Output 925 ml  Net 476.46 ml   Filed Weights   01/08/20 1550  Weight: 68 kg    Exam:   General: Somewhat thin appears chronically ill no acute distress  Cardiovascular: Regular rate and rhythm no murmur gallop or rub no lower extremity edema  Respiratory: Breath sounds clear bilaterally I hear no crackles no wheezes  Abdomen: Nondistended positive bowel sounds throughout no guarding or rebounding  Musculoskeletal: Joints without swelling/erythema  Data Reviewed: Basic Metabolic Panel: Recent Labs  Lab 01/08/20 1555 01/08/20 2248 01/09/20  0313  NA 127*  --  131*  K 4.4  --  4.6  CL 91*  --  96*  CO2 20*  --  23  GLUCOSE 110*  --  93  BUN 15  --  15  CREATININE 1.96* 1.43* 1.43*  CALCIUM 9.2  --  9.1  MG 2.0 1.8  --   PHOS  --  3.7  --    Liver Function Tests: Recent  Labs  Lab 01/08/20 2248 01/09/20 0313  AST 29 28  ALT 23 21  ALKPHOS 75 76  BILITOT 1.5* 1.9*  PROT 6.5 6.5  ALBUMIN 3.5 3.5   No results for input(s): LIPASE, AMYLASE in the last 168 hours. No results for input(s): AMMONIA in the last 168 hours. CBC: Recent Labs  Lab 01/08/20 1555 01/08/20 2248 01/09/20 0313  WBC 6.9 5.1 5.1  HGB 10.7* 10.6* 11.6*  HCT 30.6* 29.8* 32.8*  MCV 98.7 95.2 96.5  PLT 126* 98* 97*   Cardiac Enzymes: No results for input(s): CKTOTAL, CKMB, CKMBINDEX, TROPONINI in the last 168 hours. BNP (last 3 results) Recent Labs    05/20/19 0917 07/16/19 1223 12/16/19 1103  BNP 104.2* 125.2* 120.0*    ProBNP (last 3 results) No results for input(s): PROBNP in the last 8760 hours.  CBG: Recent Labs  Lab 01/08/20 1629  GLUCAP 107*    Recent Results (from the past 240 hour(s))  SARS CORONAVIRUS 2 (TAT 6-24 HRS) Nasopharyngeal Nasopharyngeal Swab     Status: None   Collection Time: 01/08/20  5:35 PM   Specimen: Nasopharyngeal Swab  Result Value Ref Range Status   SARS Coronavirus 2 NEGATIVE NEGATIVE Final    Comment: (NOTE) SARS-CoV-2 target nucleic acids are NOT DETECTED. The SARS-CoV-2 RNA is generally detectable in upper and lower respiratory specimens during the acute phase of infection. Negative results do not preclude SARS-CoV-2 infection, do not rule out co-infections with other pathogens, and should not be used as the sole basis for treatment or other patient management decisions. Negative results must be combined with clinical observations, patient history, and epidemiological information. The expected result is Negative. Fact Sheet for Patients: SugarRoll.be Fact Sheet for Healthcare Providers: https://www.woods-mathews.com/ This test is not yet approved or cleared by the [place] FDA and  has been authorized for detection and/or diagnosis of SARS-CoV-2 by FDA under an Emergency Use  Authorization (EUA). This EUA will remain  in effect (meaning this test can be used) for the duration of the COVID-19 declaration under Section 56 4(b)(1) of the Act, 21 U.S.C. section 360bbb-3(b)(1), unless the authorization is terminated or revoked sooner. Performed at [facility], [place] [address], [place], [place] [address]      Studies: DG Chest 1 View  Result Date: 01/08/2020 CLINICAL DATA:  Syncope EXAM: CHEST  1 VIEW COMPARISON:  12/08/2019 FINDINGS: Post sternotomy changes. Similar appearance of left-sided pacing device. Increasing airspace disease at the left base. Right lung is clear. Stable cardiomediastinal silhouette. No pneumothorax. Old appearing bilateral rib fractures. IMPRESSION: Increasing airspace disease at left base, favor atelectasis though developing infiltrate is also possible. Electronically Signed   By: Donavan Foil M.D.   On: 01/08/2020 16:45    Scheduled Meds: . atorvastatin  80 mg Oral q1800  . heparin  5,000 Units Subcutaneous Q8H  . levETIRAcetam  500 mg Oral BID   Continuous Infusions: . sodium chloride 50 mL/hr at 01/08/20 2154    Principal Problem:   Syncope Active Problems:   Orthostatic  hypotension   Chronic systolic heart failure (HCC)   Seizure (HCC)   Coronary artery disease   ICD (implantable cardioverter-defibrillator) in place    Time spent: 45 minutes    [facility] M NP  [facility]  If 7PM-7AM, please contact night-coverage at [email or web address], password [facility] 01/09/2020, 1:04 PM  LOS: 0 days

## 2020-01-09 NOTE — TOC Initial Note (Addendum)
Transition of Care [facility]) - Initial/Assessment Note    Patient Details  Name: [name] MRN: [id number] Date of Birth: May 28, 1961  [facility]) CM/SW Contact:    Bethena Roys, RN Phone Number: 01/09/2020, 4:33 PM  Clinical Narrative:  Patient presented for syncopal episode. Prior to arrival patient states he was home with his brother. Patient will need Lyft for home once stable for transportation. Case Manager provided patient with outpatient substance abuse resources. Patient will need an order for Vassie Moselle- referral sent to [facility]. PT recommendations for Home Health PT- patient has Medicaid- [facility] unable to staff the patient. Call placed to [facility]- awaiting call back. Call placed to [facility]- unable to accept the patient due to insurance. [facility] can only take if paired with a Medicare. If an agency can not be secured, then the patient may need a written prescription for outpatient physical therapy.                  1655 Received call back that [facility] cannot service the patient for home physical therapy.  Expected Discharge Plan: [place] Barriers to Discharge: Continued Medical Work up   Patient Goals and CMS Choice Patient states their goals for this hospitalization and ongoing recovery are:: "to return home"      Expected Discharge Plan and Services Expected Discharge Plan: [place] In-house Referral: NA Discharge Planning Services: CM Consult Post Acute Care Choice: Newborn arrangements for the past 2 months: Single Family Home                 DME Arranged: Walker rolling DME Agency: [facility] Date DME Agency Contacted: 01/09/20 Time DME Agency Contacted: [phone] Representative spoke with at DME Agency: [place] Arranged: PT    Prior Living Arrangements/Services Living arrangements for the past 2 months: [place] with::  Siblings(Pt lives with his brother.) Patient language and need for interpreter reviewed:: Yes Do you feel safe going back to the place where you live?: Yes      Need for Family Participation in Patient Care: Yes (Comment) Care giver support system in place?: Yes (comment)   Criminal Activity/Legal Involvement Pertinent to Current Situation/Hospitalization: No - Comment as needed  Activities of Daily Living Home Assistive Devices/Equipment: None ADL Screening (condition at time of admission) Patient's cognitive ability adequate to safely complete daily activities?: Yes Is the patient deaf or have difficulty hearing?: No Does the patient have difficulty seeing, even when wearing glasses/contacts?: No Does the patient have difficulty concentrating, remembering, or making decisions?: No Patient able to express need for assistance with ADLs?: Yes Does the patient have difficulty dressing or bathing?: Yes Independently performs ADLs?: Yes (appropriate for developmental age) Does the patient have difficulty walking or climbing stairs?: No Weakness of Legs: Both Weakness of Arms/Hands: None  Permission Sought/Granted Permission sought to share information with : Family Supports, Chartered certified accountant granted to share information with : Yes, Verbal Permission Granted     Permission granted to share info w AGENCY: [facility]        Emotional Assessment Appearance:: Appears stated age Attitude/Demeanor/Rapport: Engaged Affect (typically observed): Accepting Orientation: : Oriented to Self, Oriented to Place, Oriented to  Time, Oriented to Situation Alcohol / Substance Use: Not Applicable Psych Involvement: No (comment)  Admission diagnosis:  Syncope and collapse [R55] Dehydration [E86.0] Syncope [R55] Patient Active Problem List   Diagnosis Date Noted  .  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 Seizure (HCC) 10/21/2019  . ICD (implantable cardioverter-defibrillator) in place 08/29/2019  . Chronic systolic heart failure (HCC) 08/25/2019  . Orthostatic hypotension 12/21/2017  . Syncope 12/20/2017  . 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 Coronary artery disease 12/07/2017  . Aspiration pneumonia (HCC)    PCP:  No primary care provider on file. Pharmacy:   [facility] - [place], [place] - [address] [facility] [place] [address] Phone: [phone] Fax: [phone]  [facility] [place], [place] - [address] [place] [address] Phone: [phone] Fax: [phone]  [facility] - [place], [place] - [address] [place] [address] [place] [address] Phone: [phone] Fax: [phone]     Social Determinants of Health (SDOH) Interventions    Readmission Risk Interventions No flowsheet data found.

## 2020-01-09 NOTE — Consult Note (Addendum)
Advanced Heart Failure Team Consult Note   Primary Physician: No primary care provider on file. HF-Cardiologist: Dr Haroldine Laws  Reason for Consultation: Heart Failure   HPI:    [name] is seen today for evaluation of heart failure at the request of Dr Eliseo Squires.   [name] is 59 y.o.malewith h/o ETOH, VF Arrest, CAD s/p CABG 12/07/17, ICM, chronic systolic heart failure, [employer] ICD, and seizures. Most recent ECHO 05/2019 with EF 30-35%.   Admitted 12/6 - 12/14/17 with V-Fib arrest while chopping wood. Received CPR in the field. R/LHC with severe 3vD as below and cardiogenic shock requiring IABP. Required milrinone with continued shock. Pt improved and IABP removed 12/10, but pt had recurrent VF arrest so IABP replaced. Stabilized and underwent CABG 12/07/17. Tolerated gradual wean of meds and extubation with no furthe events. HF medications titrated as tolerated.  Admitted 12/27 ->12/21/2018 with syncope and AKI thought to be 2/2 volume depletion on lasix, spiro, and Entresto. CT negative for ICH. Improved rapidly with holding meds and gentle IVF. Entresto stopped. Spiro cut back and switched to low dose losartan.  He had routine clinic f/u on 10/23/19 and while in exam room had a witnessed seizurewith loss of pulse. CPR started with quick ROSC. He did not require shock.Taken to the ED and had another seizure. Neurology consulted. Started on Keppra. HS Trop 10>12.  ICD interrogated. No arrhythmias noted.   He has been followed closely in the HF clinic and was last seen 12/16/19. He has had  multiple social barriers. He has transportation issues and received food stamps. He continues to drink 6 beers a day.  He has no idea which medications he has been  taking. He says a roommate has fixed his medications but he isnt sure if it was right because she  is a heroine addict. Lives with brother and has multiple renters that are drug addicts.   Presented to ED after  fall with possible seizure versus syncope. His brother reported he  had a seizure. CXR with increasing air space disease. Pertinent admission labs included: lactic acid 2.5, Sars Covid negative, creatinine 1.96, sodium 127, K 4.4, WBC 5.1, hgb 10.6, and HS Trp 8. Due to AKI lasix, coreg, entresto, and spiro held. Placed IV fluids and today remains orthostatic.   Denies SOB/dizziness.   Echo pending.   Review of Systems: [y] = yes, [ ]  = no   . General: Weight gain [ ] ; Weight loss [ ] ; Anorexia [ ] ; Fatigue [Y ]; Fever [ ] ; Chills [ ] ; Weakness [ ]   . Cardiac: Chest pain/pressure [ ] ; Resting SOB [ ] ; Exertional SOB [ Y]; Orthopnea [ ] ; Pedal Edema [ ] ; Palpitations [ ] ; Syncope [ ] ; Presyncope [ ] ; Paroxysmal nocturnal dyspnea[ ]   . Pulmonary: Cough [ ] ; Wheezing[ ] ; Hemoptysis[ ] ; Sputum [ ] ; Snoring [ ]   . GI: Vomiting[ ] ; Dysphagia[ ] ; Melena[ ] ; Hematochezia [ ] ; Heartburn[ ] ; Abdominal pain [ ] ; Constipation [ ] ; Diarrhea [ ] ; BRBPR [ ]   . GU: Hematuria[ ] ; Dysuria [ ] ; Nocturia[ ]   . Vascular: Pain in legs with walking [ ] ; Pain in feet with lying flat [ ] ; Non-healing sores [ ] ; Stroke [ ] ; TIA [ ] ; Slurred speech [ ] ;  . Neuro: Headaches[ ] ; Vertigo[ ] ; Seizures[ Y]; Paresthesias[ ] ;Blurred vision [ ] ; Diplopia [ ] ; Vision changes [ ]   . Ortho/Skin: Arthritis [ ] ; Joint pain [Y ]; Muscle pain [ ] ; Joint swelling [ ] ;  Back Pain [ ] ; Rash [ ]   . Psych: Depression[Y ]; Anxiety[ ]   . Heme: Bleeding problems [ ] ; Clotting disorders [ ] ; Anemia [ ]   . Endocrine: Diabetes [ ] ; Thyroid dysfunction[ ]   Home Medications Prior to Admission medications   Medication Sig Start Date End Date Taking? Authorizing Provider  atorvastatin (LIPITOR) 80 MG tablet Take 1 tablet (80 mg total) by mouth daily at 6 PM. 11/24/19  Yes [name], , MD  carvedilol (COREG) 6.25 MG tablet Take 1 tablet (6.25 mg total) by mouth 2 (two) times daily with a meal. 11/24/19  Yes [name], , MD    furosemide (LASIX) 40 MG tablet Take 1 tablet (40 mg total) by mouth daily. May take extra 40mg  daily as needed for swelling 10/24/19  Yes Clegg, Amy D, NP  levETIRAcetam (KEPPRA) 500 MG tablet Take 1 tablet (500 mg total) by mouth 2 (two) times daily. 12/30/19  Yes 11/26/19, MD  sacubitril-valsartan (ENTRESTO) 97-103 MG Take 1 tablet by mouth 2 (two) times daily. 11/18/19  Yes 11/26/19 M, PA-C  spironolactone (ALDACTONE) 25 MG tablet Take 0.5 tablets (12.5 mg total) by mouth daily. 10/24/19  Yes Clegg, Amy D, NP    Past Medical History: Past Medical History:  Diagnosis Date  . AICD (automatic cardioverter/defibrillator) present   . Arthritis    "hands; maybe my toes" (12/20/2017)  . CAD (coronary artery disease)   . Cardiac arrest (HCC) 11/29/2017   hx VF arrest/notes 12/20/2017  . Coronary artery disease   . GERD (gastroesophageal reflux disease)   . H/O ETOH abuse    /notes 12/20/2017  . High cholesterol   . Hypertension   . Ischemic cardiomyopathy    a. 08/2019 s/p BSX D232 single lead AICD.  M Seizure (HCC) 10/21/2019  . Seizures (HCC) ~ 2016; ~ 2017   "I've had a couple; I was at work" (12/20/2017)  . Syncope and collapse    "last few days" (12/20/2017)    Past Surgical History: Past Surgical History:  Procedure Laterality Date  . CARDIAC CATHETERIZATION    . CORONARY ARTERY BYPASS GRAFT N/A 12/07/2017   Procedure: CORONARY ARTERY BYPASS GRAFTING (CABG) times four using left internal mammary artery and right saphenous vein using endoscope for harvest.;  Surgeon: Marland Kitchen, MD;  Location: [facility] OR;  Service: Open Heart Surgery;  Laterality: N/A;  . IABP INSERTION N/A 11/29/2017   Procedure: IABP Insertion;  Surgeon: 2018, MD;  Location: [facility] INVASIVE CV LAB;  Service: Cardiovascular;  Laterality: N/A;  . IABP INSERTION N/A 12/03/2017   Procedure: IABP INSERTION;  Surgeon: 12/22/2017, MD;  Location: [facility] INVASIVE CV LAB;  Service:  Cardiovascular;  Laterality: N/A;  . ICD IMPLANT N/A 08/29/2019   Procedure: ICD IMPLANT;  Surgeon: [name], MD;  Location: [facility] INVASIVE CV LAB;  Service: Cardiovascular;  Laterality: N/A;  . LEFT HEART CATH AND CORONARY ANGIOGRAPHY N/A 11/29/2017   Procedure: LEFT HEART CATH AND CORONARY ANGIOGRAPHY;  Surgeon: [name], MD;  Location: [facility] INVASIVE CV LAB;  Service: Cardiovascular;  Laterality: N/A;  . RIGHT HEART CATH N/A 11/29/2017   Procedure: RIGHT HEART CATH;  Surgeon: [name], MD;  Location: [facility] INVASIVE CV LAB;  Service: Cardiovascular;  Laterality: N/A;  . TEE WITHOUT CARDIOVERSION N/A 12/07/2017   Procedure: TRANSESOPHAGEAL ECHOCARDIOGRAM (TEE);  Surgeon: Marinus Maw, [facility], MD;  Location: [facility] OR;  Service: Open Heart Surgery;  Laterality: N/A;  . TONSILLECTOMY AND ADENOIDECTOMY  ~  [id number]    Family History: Family History  Problem Relation Age of Onset  . Heart attack Other     Social History: Social History   Socioeconomic History  . Marital status: Divorced    Spouse name: Not on file  . Number of children: Not on file  . Years of education: Not on file  . Highest education level: Not on file  Occupational History  . Not on file  Tobacco Use  . Smoking status: Never Smoker  . Smokeless tobacco: Never Used  Substance and Sexual Activity  . Alcohol use: Yes    Alcohol/week: 0.0 - 42.0 standard drinks    Comment: 12/20/2017 "I quit drinking after I had the heart attack"  . Drug use: No  . Sexual activity: Not Currently  Other Topics Concern  . Not on file  Social History Narrative   Lives at home with his brother who is crippled. He takes care of his brother.    Right handed    Social Determinants of Health   Financial Resource Strain:   . Difficulty of Paying Living Expenses: Not on file  Food Insecurity:   . Worried About Programme researcher, broadcasting/film/videounning Out of Food in the Last Year: Not on file  . Ran Out of Food in the Last Year: Not on file  Transportation  Needs:   . Lack of Transportation (Medical): Not on file  . Lack of Transportation (Non-Medical): Not on file  Physical Activity:   . Days of Exercise per Week: Not on file  . Minutes of Exercise per Session: Not on file  Stress:   . Feeling of Stress : Not on file  Social Connections:   . Frequency of Communication with Friends and Family: Not on file  . Frequency of Social Gatherings with Friends and Family: Not on file  . Attends Religious Services: Not on file  . Active Member of Clubs or Organizations: Not on file  . Attends BankerClub or Organization Meetings: Not on file  . Marital Status: Not on file    Allergies:  No Known Allergies  Objective:    Vital Signs:   Temp:  [97.8 F (36.6 C)-98.5 F (36.9 C)] 98.5 F (36.9 C) (01/15 0628) Pulse Rate:  [77-107] 95 (01/15 0628) Resp:  [10-19] 16 (01/15 0628) BP: (84-133)/(52-92) 133/92 (01/15 0628) SpO2:  [97 %-100 %] 99 % (01/15 0628) Weight:  [68 kg] 68 kg (01/14 1550) Last BM Date: 01/07/20  Weight change: Filed Weights   01/08/20 1550  Weight: 68 kg    Intake/Output:   Intake/Output Summary (Last 24 hours) at 01/09/2020 1334 Last data filed at 01/09/2020 0939 Gross per 24 hour  Intake 1401.46 ml  Output 925 ml  Net 476.46 ml      Physical Exam    General:  Well appearing. No resp difficulty HEENT: normal Neck: supple. JVP flat. Carotids 2+ bilat; no bruits. No lymphadenopathy or thyromegaly appreciated. Cor: PMI nondisplaced. Regular rate & rhythm. No rubs, gallops or murmurs. Lungs: clear Abdomen: soft, nontender, nondistended. No hepatosplenomegaly. No bruits or masses. Good bowel sounds. Extremities: no cyanosis, clubbing, rash, edema Neuro: alert & orientedx3, cranial nerves grossly intact. moves all 4 extremities w/o difficulty. Affect pleasant   Telemetry  SR 80s personally reviewed.   EKG    SR 81 bpm occasional PVCs.   Labs   Basic Metabolic Panel: Recent Labs  Lab 01/08/20 1555  01/08/20 2248 01/09/20 0313  NA 127*  --  131*  K 4.4  --  4.6  CL 91*  --  96*  CO2 20*  --  23  GLUCOSE 110*  --  93  BUN 15  --  15  CREATININE 1.96* 1.43* 1.43*  CALCIUM 9.2  --  9.1  MG 2.0 1.8  --   PHOS  --  3.7  --     Liver Function Tests: Recent Labs  Lab 01/08/20 2248 01/09/20 0313  AST 29 28  ALT 23 21  ALKPHOS 75 76  BILITOT 1.5* 1.9*  PROT 6.5 6.5  ALBUMIN 3.5 3.5   No results for input(s): LIPASE, AMYLASE in the last 168 hours. No results for input(s): AMMONIA in the last 168 hours.  CBC: Recent Labs  Lab 01/08/20 1555 01/08/20 2248 01/09/20 0313  WBC 6.9 5.1 5.1  HGB 10.7* 10.6* 11.6*  HCT 30.6* 29.8* 32.8*  MCV 98.7 95.2 96.5  PLT 126* 98* 97*    Cardiac Enzymes: No results for input(s): CKTOTAL, CKMB, CKMBINDEX, TROPONINI in the last 168 hours.  BNP: BNP (last 3 results) Recent Labs    05/20/19 0917 07/16/19 1223 12/16/19 1103  BNP 104.2* 125.2* 120.0*    ProBNP (last 3 results) No results for input(s): PROBNP in the last 8760 hours.   CBG: Recent Labs  Lab 01/08/20 1629  GLUCAP 107*    Coagulation Studies: Recent Labs    01/08/20 2248  LABPROT 13.6  INR 1.0     Imaging   DG Chest 1 View  Result Date: 01/08/2020 CLINICAL DATA:  Syncope EXAM: CHEST  1 VIEW COMPARISON:  12/08/2019 FINDINGS: Post sternotomy changes. Similar appearance of left-sided pacing device. Increasing airspace disease at the left base. Right lung is clear. Stable cardiomediastinal silhouette. No pneumothorax. Old appearing bilateral rib fractures. IMPRESSION: Increasing airspace disease at left base, favor atelectasis though developing infiltrate is also possible. Electronically Signed   By: Jasmine Pang M.D.   On: 01/08/2020 16:45      Medications:     Current Medications: . atorvastatin  80 mg Oral q1800  . folic acid  1 mg Oral Daily  . heparin  5,000 Units Subcutaneous Q8H  . levETIRAcetam  500 mg Oral BID  . multivitamin with  minerals  1 tablet Oral Daily  . thiamine  100 mg Oral Daily   Or  . thiamine  100 mg Intravenous Daily     Infusions: . sodium chloride 50 mL/hr at 01/08/20 2154        Assessment/Plan  1. Syncope -Orthostatic today with significant drop in BP. Off diuretics.  - Device Interrogation---> Heart Logic Score 0. No events. No arrhythmias.   2. AKI -Creatinine on admit 1.9. Diuretics held. Receiving IV fluids. Continue for now. Can stop IV fluids this afternoon.  - Creatinine trending down 1.9>1.4  3. Seizures  -Followed by [facility] Neurology -On keppra 500 mg twice a day.   4. Chronic Systolic HF, ICM  -Most recent LHC 2018 with 3 vessel CAD--> Had CABG 2018 -[employer] ICD- Heart Logic Score 0 -Repeat ECHO pending.  -Volume status low. Receiving IVF. Off diuretics for now.  - Restart carvedilol 3.125 mg twice a day.  - Can keep off entresto, lasix, and spiro for now.   5. CAD -CABG x3 2018  - No chest pain. -On high dose statin.  - Add asa 81 mg daily.   6.  ETOH abuse  -Discussed cessation.   7. Medication Noncompliance.  -This is an ongoing problem. Suspect this is contributing to his hospitalization.  He has no system intake to his medications. Ideally he would receive meds in a bubble pack at home. As an outpatient I will ask HF pharmacist to help with this.   I have set up follow up in the HF clinic.  Length of Stay: 0  Tonye Becket, NP  01/09/2020, 1:34 PM  Advanced Heart Failure Team Pager [phone] (M-F; 7a - 4p)  Please contact [facility] for night-coverage after hours (4p -7a ) and weekends on [email or web address]  Patient seen with NP, agree with the above note. Admitted with seizure vs syncope and AKI.  Possibly syncope due given orthostatic BP response when measured today. Creatinine trending down.    Very difficult to tell what meds he is actually taking at home.  Unfortunately in [facility] so cannot use paramedicine.   Echo done today and  reviewed, EF around 30% with diffuse hypokinesis, moderate RV dysfunction.   General: NAD Neck: No JVD, no thyromegaly or thyroid nodule.  Lungs: Clear to auscultation bilaterally with normal respiratory effort. CV: Nondisplaced PMI.  Heart regular S1/S2, no S3/S4, no murmur.  No peripheral edema.   Abdomen: Soft, nontender, no hepatosplenomegaly, no distention.  Skin: Intact without lesions or rashes.  Neurologic: Alert and oriented x 3.  Psych: Normal affect. Extremities: No clubbing or cyanosis.  HEENT: Normal.   I suspect syncopal event may have been due to dehydration/orthostasis, especially with rise in creatinine.  Cardiac meds held, creatinine trending down.  - Restarted Coreg 3.125 mg bid.  - If creatinine comes down and BP stabilizes, can start back on spironolactone 12.5 mg daily and Lasix 20 mg daily tomorrow.  Would hold Entresto for now and we can decide on restarting it at clinic followup.   Will arrange followup in CHF clinic.  If he stays in hospital, will see again Monday.  Will see over weekend if called.   Marca Ancona 01/09/2020 4:43 PM

## 2020-01-10 DIAGNOSIS — E860 Dehydration: Secondary | ICD-10-CM

## 2020-01-10 LAB — COMPREHENSIVE METABOLIC PANEL
ALT: 22 U/L (ref 0–44)
AST: 28 U/L (ref 15–41)
Albumin: 3.3 g/dL — ABNORMAL LOW (ref 3.5–5.0)
Alkaline Phosphatase: 65 U/L (ref 38–126)
Anion gap: 9 (ref 5–15)
BUN: 11 mg/dL (ref 6–20)
CO2: 26 mmol/L (ref 22–32)
Calcium: 9.3 mg/dL (ref 8.9–10.3)
Chloride: 99 mmol/L (ref 98–111)
Creatinine, Ser: 1.08 mg/dL (ref 0.61–1.24)
GFR calc Af Amer: >60 mL/min (ref >60)
GFR calc non Af Amer: >60 mL/min (ref >60)
Glucose, Bld: 95 mg/dL (ref 70–99)
Potassium: 4.2 mmol/L (ref 3.5–5.1)
Sodium: 134 mmol/L — ABNORMAL LOW (ref 135–145)
Total Bilirubin: 1.4 mg/dL — ABNORMAL HIGH (ref 0.3–1.2)
Total Protein: 6 g/dL — ABNORMAL LOW (ref 6.5–8.1)

## 2020-01-10 LAB — CBC
HCT: 31 % — ABNORMAL LOW (ref 39.0–52.0)
Hemoglobin: 10.8 g/dL — ABNORMAL LOW (ref 13.0–17.0)
MCH: 34 pg (ref 26.0–34.0)
MCHC: 34.8 g/dL (ref 30.0–36.0)
MCV: 97.5 fL (ref 80.0–100.0)
Platelets: 110 10*3/uL — ABNORMAL LOW (ref 150–400)
RBC: 3.18 MIL/uL — ABNORMAL LOW (ref 4.22–5.81)
RDW: 11.7 % (ref 11.5–15.5)
WBC: 4.9 10*3/uL (ref 4.0–10.5)
nRBC: 0 % (ref 0.0–0.2)

## 2020-01-10 MED ORDER — CARVEDILOL 3.125 MG PO TABS: 3.1250 mg | ORAL_TABLET | Freq: Two times a day (BID) | ORAL | 0 refills | Status: DC

## 2020-01-10 MED ORDER — FOLIC ACID 1 MG PO TABS: 1.0000 mg | ORAL_TABLET | Freq: Every day | ORAL | 0 refills | Status: DC

## 2020-01-10 MED ORDER — THIAMINE HCL 100 MG PO TABS: 100.0000 mg | ORAL_TABLET | Freq: Every day | ORAL | 0 refills | Status: DC

## 2020-01-10 NOTE — Discharge Summary (Signed)
Physician Discharge Summary  Briant CedarWesley Clay Vaca ZOX:[id number] DOB: 05/31/1961 DOA: 01/08/2020  PCP: No primary care provider on file.  Admit date: 01/08/2020 Discharge date: 01/10/2020  Admitted From: Home Discharge disposition: Home   Recommendations for Outpatient Follow-Up:   1. Close follow up with CHF clinic-- entresto on hold 2. TED hose 3. Discussed changing positions slowly 4. Instructed patient to cut lasix in half for now until seen in CHF clinic-- not sure he will be compliant with this 5. BMP 1 week   Discharge Diagnosis:   Principal Problem:   Syncope Active Problems:   Coronary artery disease   Orthostatic hypotension   Chronic systolic heart failure (HCC)   ICD (implantable cardioverter-defibrillator) in place   Seizure [facility](HCC)    Discharge Condition: Improved.  Diet recommendation: Low sodium, heart healthy.    Wound care: None.  Code status: Full.   History of Present Illness:   HPI: Patient is a 59 year old Caucasian male with past medical history significant for syncope and collapse, seizure, ischemic cardiomyopathy with EF of 35% (last echo revealed EF of 30 to 35%, impaired left ventricular relaxation and aortic sclerosis), hypertension, hyperlipidemia, alcohol abuse, coronary artery disease, history of V-fib arrest status post AICD placement and coronary artery disease.  Patient is a poor historian.  Patient tells me that he has been falling for several months.  He reported shaking earlier today.  Patient also informed me that his brother observed him seizing.  No collateral informants.  No further history obtained from patient.  No headache, no neck pain, no chest pain, no shortness of breath, no GI symptoms or urinary symptoms.   Hospital Course by Problem:   Syncope -less symptomatic today but still positive-- very anxous to go home -suspect orthostatics will continue to be an issues so have discussed movement in bed and getting up  slowly - Device Interrogation---> Heart Logic Score 0. No events. No arrhythmias.   AKI -improved with hydration  Seizures  [facility] -On keppra 500 mg twice a day.    Chronic Systolic HF, ICM  -Most recent LHC 2018 with 3 vessel CAD--> Had CABG 2018 -[employer] ICD- Heart Logic Score 0 - Restart carvedilol 3.125 mg twice a day.  -keep off entresto -resume spiro, cut lasix in half .    ETOH abuse  -has no plans to quit  Medication Noncompliance.  -defer to CHF pharmacy    Medical Consultants:   chf team   Discharge Exam:   Vitals:   01/10/20 0109 01/10/20 0656  BP: 127/85 120/88  Pulse: 86 (!) 101  Resp: 15 16  Temp: 97.9 F (36.6 C) 98.4 F (36.9 C)  SpO2: 99% 97%   Vitals:   01/09/20 1737 01/10/20 0109 01/10/20 0656 01/10/20 0738  BP: 102/72 127/85 120/88   Pulse: 76 86 (!) 101   Resp: 16 15 16    Temp: 97.9 F (36.6 C) 97.9 F (36.6 C) 98.4 F (36.9 C)   TempSrc: Oral Oral Oral   SpO2: 98% 99% 97%   Weight:    70.5 kg  Height:        General exam: Appears calm and comfortable. Mildly tremulous   The results of significant diagnostics from this hospitalization (including imaging, microbiology, ancillary and laboratory) are listed below for reference.     Procedures and Diagnostic Studies:   EEG  Result Date: 01/09/2020 Charlsie QuestYadav, Priyanka O, MD     01/09/2020  5:10 PM Patient Name:  Jacquese Hackman MRN: [id number] Epilepsy Attending: Charlsie Quest Referring Physician/Provider: Toya Smothers, NP Date: 01/09/2020 Duration: 23.10 mins Patient history: 59yo M with h/o seizures who presented with syncope. EEG to evaluate for seizure. Level of alertness: awake, drowsy AEDs during EEG study: LEV Technical aspects: This EEG study was done with scalp electrodes positioned according to the 10-20 International system of electrode placement. Electrical activity was acquired at a sampling rate of 500Hz  and reviewed with a high  frequency filter of 70Hz  and a low frequency filter of 1Hz . EEG data were recorded continuously and digitally stored. DESCRIPTION:  The posterior dominant rhythm consists of 8-9 Hz activity of moderate voltage (25-35 uV) seen predominantly in posterior head regions, symmetric and reactive to eye opening and eye closing.  Drowsiness was characterized by attenuation of the posterior background rhythm. Physiologic photic driving was seen during photic stimulation. No EEG change was seen during hyperventilation. IMPRESSION: This study is within normal limits. No seizures or epileptiform discharges were seen throughout the recording.However, only wakefulness and drowsiness were recorded. If suspicion for interictal activity remains a concern, a prolonged study including sleep should be considered.   DG Chest 1 View  Result Date: 01/08/2020 CLINICAL DATA:  Syncope EXAM: CHEST  1 VIEW COMPARISON:  12/08/2019 FINDINGS: Post sternotomy changes. Similar appearance of left-sided pacing device. Increasing airspace disease at the left base. Right lung is clear. Stable cardiomediastinal silhouette. No pneumothorax. Old appearing bilateral rib fractures. IMPRESSION: Increasing airspace disease at left base, favor atelectasis though developing infiltrate is also possible. Electronically Signed   By: Charlsie Quest M.D.   On: 01/08/2020 16:45   ECHOCARDIOGRAM COMPLETE  Result Date: 01/09/2020   ECHOCARDIOGRAM REPORT   Patient Name:   BARON PARMELEE [facility] Date of Exam: 01/09/2020 Medical Rec #:  Vincent Peyer           Height:       70.0 in Accession #:    [facility]          Weight:       150.0 lb Date of Birth:  10-04-1961            BSA:          1.85 m Patient Age:    58 years            BP:           133/92 mmHg Patient Gender: M                   HR:           95 bpm. Exam Location:  Inpatient Procedure: 2D Echo Indications:    780.2 syncope  History:        Patient has prior history of Echocardiogram  examinations, most                 recent 06/09/2019. Cardiomyopathy, Prior CABG; Risk                 Factors:Hypertension and Dyslipidemia. ETOH Abuse. CAD. AICD. hx                 of cardiac arrest.  Sonographer:    [id number] RDCS (AE) Referring Phys: 43 KAREN M BLACK IMPRESSIONS  1. Technically difficult study. Left ventricular ejection fraction, by visual estimation, is 30 to 35%. The left ventricle has moderate to severely decreased function. There is no left ventricular hypertrophy.  2. The left ventricle demonstrates global hypokinesis.  3. Left ventricular diastolic parameters are consistent with Grade I diastolic dysfunction (impaired relaxation).  4. Global right ventricle has moderately reduced systolic function.The right ventricular size is normal.  5. Left atrial size was normal.  6. Right atrial size was normal.  7. The mitral valve is normal in structure. No evidence of mitral valve regurgitation.  8. The tricuspid valve is normal in structure.  9. The aortic valve is abnormal. Aortic valve regurgitation is not visualized. Mild to moderate aortic valve sclerosis/calcification without any evidence of aortic stenosis. 10. The pulmonic valve was not well visualized. Pulmonic valve regurgitation is not visualized. 11. The inferior vena cava is dilated in size with <50% respiratory variability, suggesting right atrial pressure of 15 mmHg. 12. TR signal is inadequate for assessing pulmonary artery systolic pressure. FINDINGS  Left Ventricle: Left ventricular ejection fraction, by visual estimation, is 30 to 35%. The left ventricle has moderate to severely decreased function. The left ventricle demonstrates global hypokinesis. There is no left ventricular hypertrophy. Left ventricular diastolic parameters are consistent with Grade I diastolic dysfunction (impaired relaxation). Right Ventricle: The right ventricular size is normal. No increase in right ventricular wall thickness. Global RV systolic  function is has moderately reduced systolic function. Left Atrium: Left atrial size was normal in size. Right Atrium: Right atrial size was normal in size Pericardium: There is no evidence of pericardial effusion. Mitral Valve: The mitral valve is normal in structure. No evidence of mitral valve regurgitation. Tricuspid Valve: The tricuspid valve is normal in structure. Tricuspid valve regurgitation is not demonstrated. Aortic Valve: The aortic valve is abnormal. Aortic valve regurgitation is not visualized. Mild to moderate aortic valve sclerosis/calcification is present, without any evidence of aortic stenosis. Pulmonic Valve: The pulmonic valve was not well visualized. Pulmonic valve regurgitation is not visualized. Pulmonic regurgitation is not visualized. Aorta: The aortic root is normal in size and structure. Venous: The inferior vena cava is dilated in size with less than 50% respiratory variability, suggesting right atrial pressure of 15 mmHg. IAS/Shunts: The interatrial septum was not well visualized.  LEFT VENTRICLE PLAX 2D LVIDd:         4.35 cm  Diastology LVIDs:         3.40 cm  LV e' lateral:   6.85 cm/s LV PW:         1.00 cm  LV E/e' lateral: 7.4 LV IVS:        1.10 cm  LV e' medial:    7.83 cm/s LVOT diam:     2.20 cm  LV E/e' medial:  6.5 LV SV:         38 ml LV SV Index:   20.72 LVOT Area:     3.80 cm  LEFT ATRIUM             Index LA diam:        3.60 cm 1.95 cm/m LA Vol (A2C):   34.9 ml 18.89 ml/m LA Vol (A4C):   34.6 ml 18.73 ml/m LA Biplane Vol: 37.4 ml 20.25 ml/m  AORTIC VALVE LVOT Vmax:   69.50 cm/s LVOT Vmean:  46.300 cm/s LVOT VTI:    0.140 m  AORTA Ao Root diam: 3.05 cm MITRAL VALVE MV Area (PHT): 2.83 cm             SHUNTS MV PHT:        77.72 msec           Systemic VTI:  0.14 m MV Decel Time:  268 msec             Systemic Diam: 2.20 cm MV E velocity: 50.90 cm/s 103 cm/s MV A velocity: 80.00 cm/s 70.3 cm/s MV E/A ratio:  0.64       1.5  Oswaldo Milian MD Electronically  signed by Oswaldo Milian MD Signature Date/Time: 01/09/2020/9:54:07 PM    Final      Labs:   Basic Metabolic Panel: Recent Labs  Lab 01/08/20 1555 01/08/20 1555 01/08/20 2248 01/09/20 0313 01/09/20 1453 01/10/20 0225  NA 127*  --   --  131*  --  134*  K 4.4   < >  --  4.6  --  4.2  CL 91*  --   --  96*  --  99  CO2 20*  --   --  23  --  26  GLUCOSE 110*  --   --  93  --  95  BUN 15  --   --  15  --  11  CREATININE 1.96*  --  1.43* 1.43*  --  1.08  CALCIUM 9.2  --   --  9.1  --  9.3  MG 2.0  --  1.8  --  1.9  --   PHOS  --   --  3.7  --   --   --    < > = values in this interval not displayed.   GFR Estimated Creatinine Clearance: 76.2 mL/min (by C-G formula based on SCr of 1.08 mg/dL). Liver Function Tests: Recent Labs  Lab 01/08/20 2248 01/09/20 0313 01/10/20 0225  AST 29 28 28   ALT 23 21 22   ALKPHOS 75 76 65  BILITOT 1.5* 1.9* 1.4*  PROT 6.5 6.5 6.0*  ALBUMIN 3.5 3.5 3.3*   No results for input(s): LIPASE, AMYLASE in the last 168 hours. No results for input(s): AMMONIA in the last 168 hours. Coagulation profile Recent Labs  Lab 01/08/20 2248  INR 1.0    CBC: Recent Labs  Lab 01/08/20 1555 01/08/20 2248 01/09/20 0313 01/10/20 0225  WBC 6.9 5.1 5.1 4.9  HGB 10.7* 10.6* 11.6* 10.8*  HCT 30.6* 29.8* 32.8* 31.0*  MCV 98.7 95.2 96.5 97.5  PLT 126* 98* 97* 110*   Cardiac Enzymes: No results for input(s): CKTOTAL, CKMB, CKMBINDEX, TROPONINI in the last 168 hours. BNP: Invalid input(s): POCBNP CBG: Recent Labs  Lab 01/08/20 1629  GLUCAP 107*   D-Dimer No results for input(s): DDIMER in the last 72 hours. Hgb A1c No results for input(s): HGBA1C in the last 72 hours. Lipid Profile No results for input(s): CHOL, HDL, LDLCALC, TRIG, CHOLHDL, LDLDIRECT in the last 72 hours. Thyroid function studies Recent Labs    01/08/20 2248  TSH 1.774   Anemia work up No results for input(s): VITAMINB12, FOLATE, FERRITIN, TIBC, IRON, RETICCTPCT in the  last 72 hours. Microbiology Recent Results (from the past 240 hour(s))  SARS CORONAVIRUS 2 (TAT 6-24 HRS) Nasopharyngeal Nasopharyngeal Swab     Status: None   Collection Time: 01/08/20  5:35 PM   Specimen: Nasopharyngeal Swab  Result Value Ref Range Status   SARS Coronavirus 2 NEGATIVE NEGATIVE Final    Comment: (NOTE) SARS-CoV-2 target nucleic acids are NOT DETECTED. The SARS-CoV-2 RNA is generally detectable in upper and lower respiratory specimens during the acute phase of infection. Negative results do not preclude SARS-CoV-2 infection, do not rule out co-infections with other pathogens, and should not be used as the sole basis for treatment or other  patient management decisions. Negative results must be combined with clinical observations, patient history, and epidemiological information. The expected result is Negative. Fact Sheet for Patients: HairSlick.no Fact Sheet for Healthcare Providers: quierodirigir.com This test is not yet approved or cleared by the [place] FDA and  has been authorized for detection and/or diagnosis of SARS-CoV-2 by FDA under an Emergency Use Authorization (EUA). This EUA will remain  in effect (meaning this test can be used) for the duration of the COVID-19 declaration under Section 56 4(b)(1) of the Act, 21 U.S.C. section 360bbb-3(b)(1), unless the authorization is terminated or revoked sooner. Performed at [facility], [address], [place], [place] [address]      Discharge Instructions:   Discharge Instructions    Diet - low sodium heart healthy   Complete by: As directed    Discharge instructions   Complete by: As directed    Get up slowly as your BP drops-- see attached information   Increase activity slowly   Complete by: As directed      Allergies as of 01/10/2020   No Known Allergies     Medication List    STOP taking these medications        sacubitril-valsartan 97-103 MG Commonly known as: ENTRESTO     TAKE these medications  furosemide 20 MG tablet Commonly known as: LASIX Cut home dose in half   atorvastatin 80 MG tablet Commonly known as: LIPITOR Take 1 tablet (80 mg total) by mouth daily at 6 PM.   carvedilol 3.125 MG tablet Commonly known as: COREG Take 1 tablet (3.125 mg total) by mouth 2 (two) times daily with a meal. What changed:   medication strength  how much to take   folic acid 1 MG tablet Commonly known as: FOLVITE Take 1 tablet (1 mg total) by mouth daily.   levETIRAcetam 500 MG tablet Commonly known as: KEPPRA Take 1 tablet (500 mg total) by mouth 2 (two) times daily.   spironolactone 25 MG tablet Commonly known as: ALDACTONE Take 0.5 tablets (12.5 mg total) by mouth daily.   thiamine 100 MG tablet Take 1 tablet (100 mg total) by mouth daily.            Durable Medical Equipment  (From admission, onward)         Start     Ordered   01/10/20 0817  For home use only DME Walker rolling  Once    Question Answer Comment  Walker: With 5 Inch Wheels   Patient needs a walker to treat with the following condition Orthostatic syncope      01/10/20 0816         Follow-up Information    [facility] Follow up on 01/20/2020.   Specialty: Cardiology Why: at 9:00 Contact information: [address] [id number] mc [address] [place] [address] [phone]       [facility] Follow up.   Why: Rolling Walker Contact information: [address] [place] [address] [phone]            Time coordinating discharge: 35 min Signed:  Joseph Art DO  [facility] 01/10/2020, 8:22 AM

## 2020-01-10 NOTE — TOC Transition Note (Signed)
Transition of Care [facility]) - CM/SW Discharge Note   Patient Details  Name: [name] MRN: [id number] Date of Birth: 1961-11-10  [facility] CM/SW Contact:  Lawerance Sabal, RN Phone Number: 01/10/2020, 9:16 AM   Clinical Narrative:    RW requested to be delivered to the room. HH PT could not be arranged, patient declined outpatient PT. Cab voucher given to nurse.    Final next level of care: Home/Self Care Barriers to Discharge: No Barriers Identified   Patient Goals and CMS Choice Patient states their goals for this hospitalization and ongoing recovery are:: "to return home"      Discharge Placement                       Discharge Plan and Services In-house Referral: NA Discharge Planning Services: CM Consult Post Acute Care Choice: Home Health          DME Arranged: Walker rolling DME Agency: [employer] Date DME Agency Contacted: 01/10/20 Time DME Agency Contacted: [phone] Representative spoke with at DME Agency: zach HH Arranged: PT          Social Determinants of Health (SDOH) Interventions     Readmission Risk Interventions No flowsheet data found.

## 2020-01-10 NOTE — Progress Notes (Signed)
NT removed pt IV, catheter intact and telemetry removed  NT assisted pt on placing TED hose Pt discharge education provided at bedside  Pt has all belongings including rolling walker and printed prescriptions  Called [employer] for pt transportation  Pt discharged via wheelchair with NT

## 2020-01-13 ENCOUNTER — Telehealth (HOSPITAL_COMMUNITY): Payer: Self-pay | Admitting: Licensed Clinical Social Worker

## 2020-01-13 ENCOUNTER — Other Ambulatory Visit (HOSPITAL_COMMUNITY): Payer: Self-pay

## 2020-01-13 MED ORDER — THIAMINE HCL 100 MG PO TABS: 100.0000 mg | ORAL_TABLET | Freq: Every day | ORAL | 0 refills | Status: DC

## 2020-01-13 MED ORDER — FOLIC ACID 1 MG PO TABS: 1.0000 mg | ORAL_TABLET | Freq: Every day | ORAL | 0 refills | Status: DC

## 2020-01-13 MED FILL — VITAMIN B-1 100 MG TABS: 100 | 30 days supply | Qty: 30 | Fill #0

## 2020-01-13 MED FILL — FOLIC ACID 1 MG TABS: 1 | 30 days supply | Qty: 30 | Fill #0

## 2020-01-13 NOTE — Telephone Encounter (Signed)
CSW received call from patient expressing some issues with getting his medications.  Pt states he was given several paper scripts when he left the hospital over the weekend and told to fill them one of his local pharmacies- states he went by [facility] but he was unable to afford them- normally gets them sent out from [employer] and they waive the copays.  Discussed with Patient Advocate/pharmacy tech who states that the thiamine and the folic acid are not covered by his Medicaid- CSW had staff send to [facility]- would be $18- pt unable to afford CSW assisted with Patient Care Fund.  Pt then said he doesn't have any of his home medication- states it was taken from him at the hospital and not returned to him when he discharged Saturday.  CSW able to confirm this with hospital pharmacy and pick it up for patient.  CSW and clinic RN reviewed his discharge medications and filled his pill box.  CSW having community paramedic take out medication to patient today as he has no form of transportation.  Pt has post hospital DC appt on Tuesday- CSW called [facility] to set up with transport.  CSW called pt and informed of paramedic taking him medications and that he should only take from the pill box this week and to bring all his medications again to the clinic for his appt so we can refill again then depending on any med changes they may make at that appt.  CSW will continue to follow and assist as needed  Burna Sis, LCSW Clinical Social Worker [facility] Desk#: [phone] Cell#: [phone]

## 2020-01-13 NOTE — Telephone Encounter (Signed)
Community Paramedic informed CSW that patient has been approved to receive [employer] despite being out of [place].  Referral Placed for [employer] and sent out to team for assignment.  CSW will continue to follow and assist as needed  Burna Sis, LCSW Clinical Social Worker [facility] Desk#: [phone] Cell#: [phone]

## 2020-01-15 ENCOUNTER — Telehealth (HOSPITAL_COMMUNITY): Payer: Self-pay | Admitting: Licensed Clinical Social Worker

## 2020-01-15 NOTE — Telephone Encounter (Signed)
CSW received call from [facility] case worker Miles Costain who has picked up the patients case [phone])- they will attempt to follow patient and help with case management needs alongside our team  CSW informed her that we were able to enroll patient in community paramedicine so we would no longer need their help with medication management but she was going to continue to follow and assist with housing resources and other social concerns.  CSW attempted to call pt to check in- no answer- left VM  CSW will continue to follow and assist as needed  Burna Sis, LCSW Clinical Social Worker [facility] Desk#: [phone] Cell#: [phone]

## 2020-01-16 ENCOUNTER — Telehealth (HOSPITAL_COMMUNITY): Payer: Self-pay

## 2020-01-16 ENCOUNTER — Other Ambulatory Visit (HOSPITAL_COMMUNITY): Payer: Self-pay

## 2020-01-16 ENCOUNTER — Telehealth (HOSPITAL_COMMUNITY): Payer: Self-pay | Admitting: Licensed Clinical Social Worker

## 2020-01-16 NOTE — Progress Notes (Signed)
Paramedicine Encounter    Patient ID: [name], male    DOB: 1961/07/08, 59 y.o.   MRN: [id number]   No care team member to display  Patient Active Problem List   Diagnosis Date Noted  . 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 Seizure (HCC) 10/21/2019  . ICD (implantable cardioverter-defibrillator) in place 08/29/2019  . Chronic systolic heart failure (HCC) 08/25/2019  . Orthostatic hypotension 12/21/2017  . Syncope 12/20/2017  . 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 Coronary artery disease 12/07/2017  . Aspiration pneumonia (HCC)     Current Outpatient Medications:  .  atorvastatin (LIPITOR) 80 MG tablet, Take 1 tablet (80 mg total) by mouth daily at 6 PM., Disp: 90 tablet, Rfl: 3 .  carvedilol (COREG) 3.125 MG tablet, Take 1 tablet (3.125 mg total) by mouth 2 (two) times daily with a meal., Disp: 60 tablet, Rfl: 0 .  folic acid (FOLVITE) 1 MG tablet, Take 1 tablet (1 mg total) by mouth daily., Disp: 30 tablet, Rfl: 0 .  levETIRAcetam (KEPPRA) 500 MG tablet, Take 1 tablet (500 mg total) by mouth 2 (two) times daily., Disp: 180 tablet, Rfl: 3 .  spironolactone (ALDACTONE) 25 MG tablet, Take 0.5 tablets (12.5 mg total) by mouth daily., Disp: 30 tablet, Rfl: 6 .  thiamine 100 MG tablet, Take 1 tablet (100 mg total) by mouth daily., Disp: 30 tablet, Rfl: 0 No Known Allergies    Social History   Socioeconomic History  . Marital status: Divorced    Spouse name: Not on file  . Number of children: Not on file  . Years of education: Not on file  . Highest education level: Not on file  Occupational History  . Not on file  Tobacco Use  . Smoking status: Never Smoker  . Smokeless tobacco: Never Used  Substance and Sexual Activity  . Alcohol use: Yes    Alcohol/week: 0.0 - 42.0 standard drinks    Comment: 12/20/2017 "I quit drinking after I had the heart attack"  . Drug use: No  . Sexual activity:  Not Currently  Other Topics Concern  . Not on file  Social History Narrative   Lives at home with his brother who is crippled. He takes care of his brother.    Right handed    Social Determinants of Health   Financial Resource Strain:   . Difficulty of Paying Living Expenses: Not on file  Food Insecurity:   . Worried About Programme researcher, broadcasting/film/video in the Last Year: Not on file  . Ran Out of Food in the Last Year: Not on file  Transportation Needs:   . Lack of Transportation (Medical): Not on file  . Lack of Transportation (Non-Medical): Not on file  Physical Activity:   . Days of Exercise per Week: Not on file  . Minutes of Exercise per Session: Not on file  Stress:   . Feeling of Stress : Not on file  Social Connections:   . Frequency of Communication with Friends and Family: Not on file  . Frequency of Social Gatherings with Friends and Family: Not on file  . Attends Religious Services: Not on file  . Active Member of Clubs or Organizations: Not on file  . Attends Banker Meetings: Not on file  . Marital Status: Not on file  Intimate Partner Violence:   . Fear of Current or Ex-Partner: Not on file  . Emotionally Abused:  Not on file  . Physically Abused: Not on file  . Sexually Abused: Not on file    Physical Exam Cardiovascular:     Rate and Rhythm: Normal rate and regular rhythm.     Pulses: Normal pulses.  Pulmonary:     Effort: Pulmonary effort is normal.     Breath sounds: Normal breath sounds.  Abdominal:     General: Abdomen is flat. There is no distension.  Musculoskeletal:        General: Normal range of motion.     Right lower leg: No edema.  Skin:    General: Skin is warm and dry.     Capillary Refill: Capillary refill takes less than 2 seconds.  Neurological:     Mental Status: He is alert and oriented to person, place, and time.  Psychiatric:        Mood and Affect: Mood normal.         Future Appointments  Date Time Provider  [place]  01/20/2020  9:00 AM [facility] PA/NP [facility] None  02/17/2020  9:00 AM [facility] PA/NP [facility] None  03/01/2020  7:10 AM [facility] DEVICE REMOTES [facility]  05/31/2020  7:10 AM [facility] DEVICE REMOTES [facility]  11/29/2020  7:10 AM [facility] DEVICE REMOTES [facility]    BP 109/67 (BP Location: Left Arm, Patient Position: Sitting, Cuff Size: Normal)   Pulse 70   Resp 18   Wt 150 lb (68 kg)   SpO2 96%   BMI 21.52 kg/m   Weight yesterday- 151 lb Last visit weight- 155 lb  [name] was seen at home today and reported feeling generally well. He denied chest pain, headache, dizziness, orthopnea, fever or cough over the past week but he has had exertional dyspnea. We spoke about this at length and it seems to be his baseline. He has not been compliant with his medications from what I was able to tell as it appeared he had mixed the box I delivered earlier in the week with an older pillbox which still contained Entresto and Lasix. I emptied the pillbox and disposed of the Lasix and Entresto, then refilled it with his correct doses of current medications. Nothing further was needed at this time. I will follow up next week.   Jacquiline Doe, EMT 01/16/20  ACTION: Home visit completed Next visit planned for 1 week

## 2020-01-16 NOTE — Telephone Encounter (Signed)
Received a Request for Medical Records from [facility] of [place]. Faxed to [phone].

## 2020-01-16 NOTE — Telephone Encounter (Signed)
Received a Request for Medical Records from [facility] of [place]. Faxed Records to [phone].

## 2020-01-16 NOTE — Telephone Encounter (Signed)
CSW called pt to check in.  Pt reporting he found pills on the floor this morning and just put them back in his pill box.  Pt cannot remember if he dropped his box or spilled anything but just says he found them.   CSW informed community paramedic who will come out to check pill box for patient.  CSW will continue to follow and assist as needed  Burna Sis, LCSW Clinical Social Worker [facility] Desk#: [phone] Cell#: [phone]

## 2020-01-20 ENCOUNTER — Other Ambulatory Visit (HOSPITAL_COMMUNITY): Payer: Self-pay

## 2020-01-20 ENCOUNTER — Ambulatory Visit (HOSPITAL_COMMUNITY)
Admission: RE | Admit: 2020-01-20 | Discharge: 2020-01-20 | Disposition: A | Discharge status: Home / Self Care | Payer: Medicaid Other | Source: Ambulatory Visit | Attending: Adult Health | Admitting: Adult Health

## 2020-01-20 ENCOUNTER — Encounter (HOSPITAL_COMMUNITY): Payer: Self-pay

## 2020-01-20 ENCOUNTER — Other Ambulatory Visit: Payer: Self-pay

## 2020-01-20 VITALS — BP 142/88 | HR 106 | Wt 154.4 lb

## 2020-01-20 DIAGNOSIS — Z8674 Personal history of sudden cardiac arrest: Secondary | ICD-10-CM | POA: Insufficient documentation

## 2020-01-20 DIAGNOSIS — F1010 Alcohol abuse, uncomplicated: Secondary | ICD-10-CM | POA: Insufficient documentation

## 2020-01-20 DIAGNOSIS — I5022 Chronic systolic (congestive) heart failure: Secondary | ICD-10-CM | POA: Diagnosis not present

## 2020-01-20 DIAGNOSIS — I255 Ischemic cardiomyopathy: Secondary | ICD-10-CM | POA: Diagnosis not present

## 2020-01-20 DIAGNOSIS — Z8249 Family history of ischemic heart disease and other diseases of the circulatory system: Secondary | ICD-10-CM | POA: Insufficient documentation

## 2020-01-20 DIAGNOSIS — I2510 Atherosclerotic heart disease of native coronary artery without angina pectoris: Secondary | ICD-10-CM | POA: Diagnosis not present

## 2020-01-20 DIAGNOSIS — Z951 Presence of aortocoronary bypass graft: Secondary | ICD-10-CM | POA: Insufficient documentation

## 2020-01-20 DIAGNOSIS — Z91199 Patient's noncompliance with other medical treatment and regimen due to unspecified reason: Secondary | ICD-10-CM

## 2020-01-20 DIAGNOSIS — I110 Hypertensive heart disease with heart failure: Secondary | ICD-10-CM | POA: Diagnosis not present

## 2020-01-20 DIAGNOSIS — I252 Old myocardial infarction: Secondary | ICD-10-CM | POA: Insufficient documentation

## 2020-01-20 DIAGNOSIS — Z95810 Presence of automatic (implantable) cardiac defibrillator: Secondary | ICD-10-CM | POA: Diagnosis not present

## 2020-01-20 DIAGNOSIS — Z79899 Other long term (current) drug therapy: Secondary | ICD-10-CM | POA: Insufficient documentation

## 2020-01-20 DIAGNOSIS — I25810 Atherosclerosis of coronary artery bypass graft(s) without angina pectoris: Secondary | ICD-10-CM | POA: Insufficient documentation

## 2020-01-20 DIAGNOSIS — E7800 Pure hypercholesterolemia, unspecified: Secondary | ICD-10-CM | POA: Insufficient documentation

## 2020-01-20 DIAGNOSIS — Z9119 Patient's noncompliance with other medical treatment and regimen: Secondary | ICD-10-CM | POA: Insufficient documentation

## 2020-01-20 DIAGNOSIS — G40909 Epilepsy, unspecified, not intractable, without status epilepticus: Secondary | ICD-10-CM | POA: Insufficient documentation

## 2020-01-20 LAB — BASIC METABOLIC PANEL
Anion gap: 12 (ref 5–15)
BUN: 5 mg/dL — ABNORMAL LOW (ref 6–20)
CO2: 24 mmol/L (ref 22–32)
Calcium: 9 mg/dL (ref 8.9–10.3)
Chloride: 103 mmol/L (ref 98–111)
Creatinine, Ser: 0.97 mg/dL (ref 0.61–1.24)
GFR calc Af Amer: >60 mL/min (ref >60)
GFR calc non Af Amer: >60 mL/min (ref >60)
Glucose, Bld: 88 mg/dL (ref 70–99)
Potassium: 4.3 mmol/L (ref 3.5–5.1)
Sodium: 139 mmol/L (ref 135–145)

## 2020-01-20 NOTE — Patient Instructions (Signed)
It was great to see you today! No medication changes are needed at this time.   Labs today We will only contact you if something comes back abnormal or we need to make some changes. Otherwise no news is good news!   Your physician recommends that you schedule a follow-up appointment in: 2 weeks  in the Advanced Practitioners (PA/NP) Clinic    At the [facility], you and your health needs are our priority. As part of our continuing mission to provide you with exceptional heart care, we have created designated Provider Care Teams. These Care Teams include your primary Cardiologist (physician) and Advanced Practice Providers (APPs- Physician Assistants and Nurse Practitioners) who all work together to provide you with the care you need, when you need it.   You may see any of the following providers on your designated Care Team at your next follow up: . Dr Daniel [name] . Dr Dalton McLean . Amy Clegg, NP . Brittainy Simmons, PA . Lauren Kemp, PharmD   Please be sure to bring in all your medications bottles to every appointment.     

## 2020-01-20 NOTE — Progress Notes (Signed)
CSW and Clinical biochemist met with pt during clinic visit.  Pt reports doing ok and states he has not been falling out like he had been before his medication adjustments following his most recent visit.  Pt is grateful for paramedicine as he was having a lot of trouble managing his own medications at home.  Pt to come in 2 weeks for another check up- CSW arranged [facility] transportation for him for that appt.  Paramedic filled pill box while in the clinic but will visit pt on Friday to check in.  CSW will continue to follow and assist as needed  [name], [facility] Desk#: [phone] Cell#: [phone]

## 2020-01-20 NOTE — Addendum Note (Signed)
Encounter addended by: Burna Sis, LCSW on: 01/20/2020 12:16 PM  Actions taken: Clinical Note Signed

## 2020-01-20 NOTE — Progress Notes (Signed)
ADVANCED HF CLINIC NOTE  PCP: None  Primary Cardiologist: Dr Haroldine Laws   HPI: [name] [facility] a 59 y.o.malewith h/o ETOH, VF Arrest, CAD s/p CABG 12/07/17, and ICM with EF 25-30%.  Admitted 12/6 - 12/14/17 with V-Fib arrest while chopping wood. Received CPR in the field. R/LHC with severe 3vD as below and cardiogenic shock requiring IABP. Required milrinone with continued shock. Pt improved and IABP removed 12/10, but pt had recurrent VF arrest so IABP replaced. Stabilized and underwent CABG 12/07/17. Tolerated gradual wean of meds and extubation with no furthe events. HF medications titrated as tolerated.  Admitted 12/27 ->12/28 with syncope and AKI thought to be 2/2 volume depletion on lasix, spiro, and Entresto. CT negative for ICH. Improved rapidly with holding meds and gentle IVF. Entresto stopped. Spiro cut back and switched to low dose losartan.  He had repeat echo 04/2019 that showed persistently reduced LVEF at 25%.    Last clinic visit w/ Dr. Haroldine Laws on 7/22, losartan was discontinued and pt placed back on low dose Entresto (has been tolerating well w/o recurrent syncope/ near syncope). Given EF < 35% despite medical therapy, he was referred to EP and seen by Dr. Lovena Le who recommended ICD implantation.    He was admitted 08/29/19 for implantation of a [employer] single chamber ICD.   He had routine clinic f/u on 10/23/19 and while in exam room had a witnessed seizure with loss of pulse. CPR started with quick ROSC. He did not require shock. Taken to the ED and had another seizure. Neurology consulted. Started on Keppra. HS Trop 10>12.  ICD interrogated. No arrhythmias noted.  From HF perspective he had mild volume overload and had a dose of IV lasix and transitioned back to 40 mg PO daily. His previous HF meds were restarted w/ addition of spironolactone.  He had no recurrent seizures after initiation of Keprra. Neurology recommended continuation of Keppra 500  mg twice a day and he was instructed not to drive.   Had return clinic f/u 11/04/19 and reported full compliance w/ Keppra.  Admitted to [facility] on 12/09/19 with recurrent seizures in setting of noncompliance with his Keppra.   Admitted 01/08/20 after syncopal episode. Had AKI so HF meds stopped. Given IV fluids and started back on coreg, spiro, and lasix. [name] was stopped.  Today he returns for Post HF follow up. Overall feeling fine. Denies dizziness. Remains SOB with exertion. Denies PND/Orthopnea. Appetite ok. No fever or chills. Weight at home every now an then. Drinking at least 6 beers a day. Medications have continued to be any issue. He found some medications on the floor and put them in his pill box. Followed by HF Paramedicine.    ROS: All systems negative except as listed in HPI, PMH and Problem List.  SH:  Social History   Socioeconomic History  . Marital status: Divorced    Spouse name: Not on file  . Number of children: Not on file  . Years of education: Not on file  . Highest education level: Not on file  Occupational History  . Not on file  Tobacco Use  . Smoking status: Never Smoker  . Smokeless tobacco: Never Used  Substance and Sexual Activity  . Alcohol use: Yes    Alcohol/week: 0.0 - 42.0 standard drinks    Comment: 12/20/2017 "I quit drinking after I had the heart attack"  . Drug use: No  . Sexual activity: Not Currently  Other Topics Concern  . Not  on file  Social History Narrative   Lives at home with his brother who is crippled. He takes care of his brother.    Right handed    Social Determinants of Health   Financial Resource Strain:   . Difficulty of Paying Living Expenses: Not on file  Food Insecurity:   . Worried About Programme researcher, broadcasting/film/video in the Last Year: Not on file  . Ran Out of Food in the Last Year: Not on file  Transportation Needs:   . Lack of Transportation (Medical): Not on file  . Lack of Transportation (Non-Medical):  Not on file  Physical Activity:   . Days of Exercise per Week: Not on file  . Minutes of Exercise per Session: Not on file  Stress:   . Feeling of Stress : Not on file  Social Connections:   . Frequency of Communication with Friends and Family: Not on file  . Frequency of Social Gatherings with Friends and Family: Not on file  . Attends Religious Services: Not on file  . Active Member of Clubs or Organizations: Not on file  . Attends Banker Meetings: Not on file  . Marital Status: Not on file  Intimate Partner Violence:   . Fear of Current or Ex-Partner: Not on file  . Emotionally Abused: Not on file  . Physically Abused: Not on file  . Sexually Abused: Not on file    FH:  Family History  Problem Relation Age of Onset  . Heart attack Other     Past Medical History:  Diagnosis Date  . AICD (automatic cardioverter/defibrillator) present   . Arthritis    "hands; maybe my toes" (12/20/2017)  . CAD (coronary artery disease)   . Cardiac arrest (HCC) 11/29/2017   hx VF arrest/notes 12/20/2017  . Coronary artery disease   . GERD (gastroesophageal reflux disease)   . H/O ETOH abuse    /notes 12/20/2017  . High cholesterol   . Hypertension   . Ischemic cardiomyopathy    a. 08/2019 s/p BSX D232 single lead AICD.  Marland Kitchen Seizure (HCC) 10/21/2019  . Seizures (HCC) ~ 2016; ~ 2017   "I've had a couple; I was at work" (12/20/2017)  . Syncope and collapse    "last few days" (12/20/2017)    Current Outpatient Medications  Medication Sig Dispense Refill  . atorvastatin (LIPITOR) 80 MG tablet Take 1 tablet (80 mg total) by mouth daily at 6 PM. 90 tablet 3  . carvedilol (COREG) 3.125 MG tablet Take 1 tablet (3.125 mg total) by mouth 2 (two) times daily with a meal. 60 tablet 0  . folic acid (FOLVITE) 1 MG tablet Take 1 tablet (1 mg total) by mouth daily. 30 tablet 0  . levETIRAcetam (KEPPRA) 500 MG tablet Take 1 tablet (500 mg total) by mouth 2 (two) times daily. 180 tablet 3   . spironolactone (ALDACTONE) 25 MG tablet Take 0.5 tablets (12.5 mg total) by mouth daily. 30 tablet 6  . thiamine 100 MG tablet Take 1 tablet (100 mg total) by mouth daily. 30 tablet 0   No current facility-administered medications for this encounter.    Vitals:   01/20/20 0856  BP: (!) 142/88  Pulse: (!) 106  SpO2: 97%  Weight: 70 kg (154 lb 6.4 oz)   Wt Readings from Last 3 Encounters:  01/20/20 70 kg (154 lb 6.4 oz)  01/16/20 68 kg (150 lb)  01/10/20 70.5 kg (155 lb 6.8 oz)   PHYSICAL EXAM: General:  Well appearing. No resp difficulty HEENT: normal Neck: supple. no JVD. Carotids 2+ bilat; no bruits. No lymphadenopathy or thryomegaly appreciated. Cor: PMI nondisplaced. Regular rate & rhythm. No rubs, gallops or murmurs. Lungs: clear Abdomen: soft, nontender, nondistended. No hepatosplenomegaly. No bruits or masses. Good bowel sounds. Extremities: no cyanosis, clubbing, rash, edema Neuro: alert & orientedx3, cranial nerves grossly intact. moves all 4 extremities w/o difficulty. Affect pleasant  ASSESSMENT & PLAN:  1. Seizure: recent diagnosis 09/2019.  -On Keppra 500 mg bid.  - Followed by Neurology.   2. Chronic systolic CHF with ICM - TTE 11/30/17 25-30% RV normal. Echo 03/2018: EF40-45%RV normal.  - ECHO 5/20 EF ~ 25%. Has ICD.  - ECHO 12/2019 EF 30-35% RV norma; - Chronic NYHA II-III symptoms.  - Volume status stable. Continue lasix 20 mg daily.   - Continue Spironolactone 12.5 mg daily  -Continue Coreg  3.125 mg twice a day.  - Check BMET today.  - Not a candidate for advanced therapies due to ongoing alcohol abuse. - 3. CAD s/p CABG 12/07/2017 - No chest pain.  -ContinueASA  blockerandatorvastatin.  4. H/o VF arrest in setting of STEMI - stable Interrogation as above  5. ETOH abuse  _Discussed cessation but he is not interested in stopping.   6. H/O Noncompliance/ Poor Health Literacy  - Followed by HF Paramedicine.   No medication changes  today because his medications were off again this week. Check BMET today.  Follow up in 2 weeks.   Continue HF Paramedicine.    Korbin Notaro NP-C  9:06 AM

## 2020-01-20 NOTE — Progress Notes (Signed)
Paramedicine Encounter   Patient ID: Daisy Mcneel , male,   DOB: [birth year],59 y.o.,  MRN: [id number]  Mr Barg was seen in the HF clinic today with Tonye Becket, NP. He reported feeling generally well and stated he has been compliant with his medications over the weekend. His medications were verified and his pillbox was refilled.   [name], EMT 01/20/2020   ACTION: Home visit completed Next visit planned for 1 week

## 2020-01-23 ENCOUNTER — Other Ambulatory Visit (HOSPITAL_COMMUNITY): Payer: Self-pay

## 2020-01-23 NOTE — Progress Notes (Signed)
Paramedicine Encounter    Patient ID: [name], male    DOB: 1961/07/08, 59 y.o.   MRN: [id number]   No care team member to display  Patient Active Problem List   Diagnosis Date Noted  . 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 Seizure (HCC) 10/21/2019  . ICD (implantable cardioverter-defibrillator) in place 08/29/2019  . Chronic systolic heart failure (HCC) 08/25/2019  . Orthostatic hypotension 12/21/2017  . Syncope 12/20/2017  . 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 Coronary artery disease 12/07/2017  . Aspiration pneumonia (HCC)     Current Outpatient Medications:  .  atorvastatin (LIPITOR) 80 MG tablet, Take 1 tablet (80 mg total) by mouth daily at 6 PM., Disp: 90 tablet, Rfl: 3 .  carvedilol (COREG) 3.125 MG tablet, Take 1 tablet (3.125 mg total) by mouth 2 (two) times daily with a meal., Disp: 60 tablet, Rfl: 0 .  folic acid (FOLVITE) 1 MG tablet, Take 1 tablet (1 mg total) by mouth daily., Disp: 30 tablet, Rfl: 0 .  levETIRAcetam (KEPPRA) 500 MG tablet, Take 1 tablet (500 mg total) by mouth 2 (two) times daily., Disp: 180 tablet, Rfl: 3 .  spironolactone (ALDACTONE) 25 MG tablet, Take 0.5 tablets (12.5 mg total) by mouth daily., Disp: 30 tablet, Rfl: 6 .  thiamine 100 MG tablet, Take 1 tablet (100 mg total) by mouth daily., Disp: 30 tablet, Rfl: 0 No Known Allergies    Social History   Socioeconomic History  . Marital status: Divorced    Spouse name: Not on file  . Number of children: Not on file  . Years of education: Not on file  . Highest education level: Not on file  Occupational History  . Not on file  Tobacco Use  . Smoking status: Never Smoker  . Smokeless tobacco: Never Used  Substance and Sexual Activity  . Alcohol use: Yes    Alcohol/week: 0.0 - 42.0 standard drinks    Comment: 12/20/2017 "I quit drinking after I had the heart attack"  . Drug use: No  . Sexual activity:  Not Currently  Other Topics Concern  . Not on file  Social History Narrative   Lives at home with his brother who is crippled. He takes care of his brother.    Right handed    Social Determinants of Health   Financial Resource Strain:   . Difficulty of Paying Living Expenses: Not on file  Food Insecurity:   . Worried About Programme researcher, broadcasting/film/video in the Last Year: Not on file  . Ran Out of Food in the Last Year: Not on file  Transportation Needs:   . Lack of Transportation (Medical): Not on file  . Lack of Transportation (Non-Medical): Not on file  Physical Activity:   . Days of Exercise per Week: Not on file  . Minutes of Exercise per Session: Not on file  Stress:   . Feeling of Stress : Not on file  Social Connections:   . Frequency of Communication with Friends and Family: Not on file  . Frequency of Social Gatherings with Friends and Family: Not on file  . Attends Religious Services: Not on file  . Active Member of Clubs or Organizations: Not on file  . Attends Banker Meetings: Not on file  . Marital Status: Not on file  Intimate Partner Violence:   . Fear of Current or Ex-Partner: Not on file  . Emotionally Abused:  Not on file  . Physically Abused: Not on file  . Sexually Abused: Not on file    Physical Exam Cardiovascular:     Rate and Rhythm: Regular rhythm. Tachycardia present.     Pulses: Normal pulses.  Pulmonary:     Effort: Pulmonary effort is normal.     Breath sounds: Normal breath sounds.  Abdominal:     General: Abdomen is flat. There is no distension.  Musculoskeletal:        General: Normal range of motion.     Right lower leg: No edema.     Left lower leg: No edema.  Skin:    General: Skin is warm and dry.     Capillary Refill: Capillary refill takes less than 2 seconds.  Neurological:     Mental Status: He is alert and oriented to person, place, and time.  Psychiatric:        Mood and Affect: Mood normal.         Future  Appointments  Date Time Provider Department Center  02/03/2020 11:00 AM [facility] PA/NP [facility] None  02/17/2020  9:00 AM [facility] PA/NP [facility] None  03/01/2020  7:10 AM [facility] DEVICE REMOTES [facility]  05/31/2020  7:10 AM [facility] DEVICE REMOTES [facility]  11/29/2020  7:10 AM [facility] DEVICE REMOTES [facility]    BP 131/90 (BP Location: Left Arm, Patient Position: Sitting, Cuff Size: Normal)   Pulse (!) 108   Resp 16   Wt 156 lb (70.8 kg)   SpO2 98%   BMI 22.38 kg/m   Weight yesterday- 154 lb Last visit weight- 154 lb  Mr [name] was seen at home today and reported feeling well. He denied chest pain, SOB, headache, dizziness, orthopnea, fever or cough since our last visit. He reported being compliant with his medications over the past week and his weight has been stable. Upon looking over his pillbox, he had missed a couple doses of spironolactone over the past week. These are half tablet doses are are hard for him to see in the pillbox. To try to help in this I have instructed him to make sure only the slot he wants is open and then dump all the contents of that slot into his hand that was he wont leave any pills behind. Mr [name] was understanding and agreeable. I will follow up next week.   [name], EMT 01/23/20  ACTION: Home visit completed Next visit planned for 1 week

## 2020-01-27 ENCOUNTER — Telehealth (HOSPITAL_COMMUNITY): Payer: Self-pay

## 2020-01-27 NOTE — Telephone Encounter (Signed)
I called Mr Loera to schedule an appointment. He did not answer so I left a message requesting he call me back.   [name], EMT 01/27/20

## 2020-01-30 ENCOUNTER — Other Ambulatory Visit (HOSPITAL_COMMUNITY): Payer: Self-pay

## 2020-01-30 NOTE — Progress Notes (Signed)
Mr Nevares was seen at home today for a medication box refill. Upon arriving at his house he reported feeling well, denying any chest pain, SOB, headache, dizziness, orthopnea, fever or cough. He reported being compliant with his medications however he had a pillbox completely full of his medications which I filled last week. I asked him if he was really taking his medications daily and if so why he still had a full box and he did not offer an explanation. I stressed the importance of medications compliance and he was understanding and agreeable. I will follow up next week.   [name], EMT 01/30/20

## 2020-02-03 ENCOUNTER — Other Ambulatory Visit: Payer: Self-pay

## 2020-02-03 ENCOUNTER — Ambulatory Visit (HOSPITAL_COMMUNITY)
Admission: RE | Admit: 2020-02-03 | Discharge: 2020-02-03 | Disposition: A | Discharge status: Home / Self Care | Payer: Medicaid Other | Source: Ambulatory Visit | Attending: Cardiology | Admitting: Cardiology

## 2020-02-03 ENCOUNTER — Other Ambulatory Visit (HOSPITAL_COMMUNITY): Payer: Self-pay

## 2020-02-03 VITALS — BP 164/120 | HR 92 | Wt 149.6 lb

## 2020-02-03 DIAGNOSIS — M1990 Unspecified osteoarthritis, unspecified site: Secondary | ICD-10-CM | POA: Diagnosis not present

## 2020-02-03 DIAGNOSIS — I255 Ischemic cardiomyopathy: Secondary | ICD-10-CM | POA: Insufficient documentation

## 2020-02-03 DIAGNOSIS — Z9119 Patient's noncompliance with other medical treatment and regimen: Secondary | ICD-10-CM

## 2020-02-03 DIAGNOSIS — Z79899 Other long term (current) drug therapy: Secondary | ICD-10-CM | POA: Diagnosis not present

## 2020-02-03 DIAGNOSIS — I252 Old myocardial infarction: Secondary | ICD-10-CM | POA: Diagnosis not present

## 2020-02-03 DIAGNOSIS — Z951 Presence of aortocoronary bypass graft: Secondary | ICD-10-CM | POA: Diagnosis not present

## 2020-02-03 DIAGNOSIS — F1010 Alcohol abuse, uncomplicated: Secondary | ICD-10-CM | POA: Diagnosis not present

## 2020-02-03 DIAGNOSIS — Z8674 Personal history of sudden cardiac arrest: Secondary | ICD-10-CM | POA: Diagnosis not present

## 2020-02-03 DIAGNOSIS — E7800 Pure hypercholesterolemia, unspecified: Secondary | ICD-10-CM | POA: Insufficient documentation

## 2020-02-03 DIAGNOSIS — I110 Hypertensive heart disease with heart failure: Secondary | ICD-10-CM | POA: Insufficient documentation

## 2020-02-03 DIAGNOSIS — Z95810 Presence of automatic (implantable) cardiac defibrillator: Secondary | ICD-10-CM | POA: Diagnosis not present

## 2020-02-03 DIAGNOSIS — R569 Unspecified convulsions: Secondary | ICD-10-CM | POA: Insufficient documentation

## 2020-02-03 DIAGNOSIS — I5022 Chronic systolic (congestive) heart failure: Secondary | ICD-10-CM | POA: Insufficient documentation

## 2020-02-03 DIAGNOSIS — Z91199 Patient's noncompliance with other medical treatment and regimen due to unspecified reason: Secondary | ICD-10-CM

## 2020-02-03 DIAGNOSIS — I2510 Atherosclerotic heart disease of native coronary artery without angina pectoris: Secondary | ICD-10-CM | POA: Insufficient documentation

## 2020-02-03 DIAGNOSIS — Z7982 Long term (current) use of aspirin: Secondary | ICD-10-CM | POA: Insufficient documentation

## 2020-02-03 MED ORDER — ENTRESTO 24-26 MG PO TABS: 1.0000 | ORAL_TABLET | Freq: Two times a day (BID) | ORAL | 3 refills | Status: DC

## 2020-02-03 MED FILL — ENTRESTO 24 MG-26 MG TABLET: 24-26 | 30 days supply | Qty: 60 | Fill #0 | Status: TO

## 2020-02-03 MED FILL — ENTRESTO 24 MG-26 MG TABLET: 24-26 | 30 days supply | Qty: 60 | Fill #0

## 2020-02-03 NOTE — Patient Instructions (Signed)
Start Entresto 24/26 mg Twice daily   Your physician recommends that you schedule a follow-up appointment in: 2 weeks  At the [facility], you and your health needs are our priority. As part of our continuing mission to provide you with exceptional heart care, we have created designated Provider Care Teams. These Care Teams include your primary Cardiologist (physician) and Advanced Practice Providers (APPs- Physician Assistants and Nurse Practitioners) who all work together to provide you with the care you need, when you need it.   You may see any of the following providers on your designated Care Team at your next follow up: Marland Kitchen Dr Arvilla Meres . Dr Marca Ancona . Tonye Becket, NP . Robbie Lis, PA . Karle Plumber, PharmD   Please be sure to bring in all your medications bottles to every appointment.

## 2020-02-03 NOTE — Progress Notes (Signed)
ADVANCED HF CLINIC NOTE  PCP: None  Primary Cardiologist: Dr Gala Romney   HPI: [name] a 59 y.o.malewith h/o ETOH, VF Arrest, CAD s/p CABG 12/07/17, and ICM with EF 25-30%.  Admitted 12/6 - 12/14/17 with V-Fib arrest while chopping wood. Received CPR in the field. R/LHC with severe 3vD as below and cardiogenic shock requiring IABP. Required milrinone with continued shock. Pt improved and IABP removed 12/10, but pt had recurrent VF arrest so IABP replaced. Stabilized and underwent CABG 12/07/17. Tolerated gradual wean of meds and extubation with no furthe events. HF medications titrated as tolerated.  Admitted 12/27 ->12/28 with syncope and AKI thought to be 2/2 volume depletion on lasix, spiro, and Entresto. CT negative for ICH. Improved rapidly with holding meds and gentle IVF. Entresto stopped. Spiro cut back and switched to low dose losartan.  He had repeat echo 04/2019 that showed persistently reduced LVEF at 25%.    Last clinic visit w/ Dr. Gala Romney on 7/22, losartan was discontinued and pt placed back on low dose Entresto (has been tolerating well w/o recurrent syncope/ near syncope). Given EF < 35% despite medical therapy, he was referred to EP and seen by Dr. Ladona Ridgel who recommended ICD implantation.    He was admitted 08/29/19 for implantation of a [employer] single chamber ICD.   He had routine clinic f/u on 10/23/19 and while in exam room had a witnessed seizure with loss of pulse. CPR started with quick ROSC. He did not require shock. Taken to the ED and had another seizure. Neurology consulted. Started on Keppra. HS Trop 10>12.  ICD interrogated. No arrhythmias noted.  From HF perspective he had mild volume overload and had a dose of IV lasix and transitioned back to 40 mg PO daily. His previous HF meds were restarted w/ addition of spironolactone.  He had no recurrent seizures after initiation of Keprra. Neurology recommended continuation of Keppra 500  mg twice a day and he was instructed not to drive.   Had return clinic f/u 11/04/19 and reported full compliance w/ Keppra.  Admitted to [name] on 12/09/19 with recurrent seizures in setting of noncompliance with his Keppra.   Admitted 01/08/20 after syncopal episode. Had AKI so HF meds stopped. Given IV fluids and started back on coreg, spiro, and lasix. [name] was stopped.  Today he returns for HF follow up. Overall feeling fine. Remains SOB with exertion. Denies PND/Orthopnea. Appetite ok. No fever or chills. He has not been weighing at home. He has not been taking his medications as directed. Has a pill box but only takes some of the medications. Continues to drink 6 beers a day. Paramedicine following for medications.   ROS: All systems negative except as listed in HPI, PMH and Problem List.  SH:  Social History   Socioeconomic History  . Marital status: Divorced    Spouse name: Not on file  . Number of children: Not on file  . Years of education: Not on file  . Highest education level: Not on file  Occupational History  . Not on file  Tobacco Use  . Smoking status: Never Smoker  . Smokeless tobacco: Never Used  Substance and Sexual Activity  . Alcohol use: Yes    Alcohol/week: 0.0 - 42.0 standard drinks    Comment: 12/20/2017 "I quit drinking after I had the heart attack"  . Drug use: No  . Sexual activity: Not Currently  Other Topics Concern  . Not on file  Social History  Narrative   Lives at home with his brother who is crippled. He takes care of his brother.    Right handed    Social Determinants of Health   Financial Resource Strain:   . Difficulty of Paying Living Expenses: Not on file  Food Insecurity:   . Worried About Charity fundraiser in the Last Year: Not on file  . Ran Out of Food in the Last Year: Not on file  Transportation Needs:   . Lack of Transportation (Medical): Not on file  . Lack of Transportation (Non-Medical): Not on file    Physical Activity:   . Days of Exercise per Week: Not on file  . Minutes of Exercise per Session: Not on file  Stress:   . Feeling of Stress : Not on file  Social Connections:   . Frequency of Communication with Friends and Family: Not on file  . Frequency of Social Gatherings with Friends and Family: Not on file  . Attends Religious Services: Not on file  . Active Member of Clubs or Organizations: Not on file  . Attends Archivist Meetings: Not on file  . Marital Status: Not on file  Intimate Partner Violence:   . Fear of Current or Ex-Partner: Not on file  . Emotionally Abused: Not on file  . Physically Abused: Not on file  . Sexually Abused: Not on file    FH:  Family History  Problem Relation Age of Onset  . Heart attack Other     Past Medical History:  Diagnosis Date  . AICD (automatic cardioverter/defibrillator) present   . Arthritis    "hands; maybe my toes" (12/20/2017)  . CAD (coronary artery disease)   . Cardiac arrest ([place]) 11/29/2017   hx VF arrest/notes 12/20/2017  . Coronary artery disease   . GERD (gastroesophageal reflux disease)   . H/O ETOH abuse    /notes 12/20/2017  . High cholesterol   . Hypertension   . Ischemic cardiomyopathy    a. 08/2019 s/p BSX D232 single lead AICD.  Marland Kitchen Seizure ([place]) 10/21/2019  . Seizures ([place]) ~ 2016; ~ 2017   "I've had a couple; I was at work" (12/20/2017)  . Syncope and collapse    "last few days" (12/20/2017)    Current Outpatient Medications  Medication Sig Dispense Refill  . atorvastatin (LIPITOR) 80 MG tablet Take 1 tablet (80 mg total) by mouth daily at 6 PM. 90 tablet 3  . folic acid (FOLVITE) 1 MG tablet Take 1 tablet (1 mg total) by mouth daily. 30 tablet 0  . levETIRAcetam (KEPPRA) 500 MG tablet Take 1 tablet (500 mg total) by mouth 2 (two) times daily. 180 tablet 3  . spironolactone (ALDACTONE) 25 MG tablet Take 0.5 tablets (12.5 mg total) by mouth daily. 30 tablet 6  . thiamine 100 MG tablet  Take 1 tablet (100 mg total) by mouth daily. 30 tablet 0  . carvedilol (COREG) 3.125 MG tablet Take 1 tablet (3.125 mg total) by mouth 2 (two) times daily with a meal. 60 tablet 0   No current facility-administered medications for this encounter.    Vitals:   02/03/20 1045  BP: (!) 164/120  Pulse: 92  SpO2: 97%  Weight: 67.9 kg (149 lb 9.6 oz)   Wt Readings from Last 3 Encounters:  02/03/20 67.9 kg (149 lb 9.6 oz)  01/23/20 70.8 kg (156 lb)  01/20/20 70 kg (154 lb 6.4 oz)   PHYSICAL EXAM: General:  Well appearing. No resp  difficulty HEENT: normal Neck: supple. no JVD. Carotids 2+ bilat; no bruits. No lymphadenopathy or thryomegaly appreciated. Cor: PMI nondisplaced. Regular rate & rhythm. No rubs, or murmurs.  S3  Lungs: clear Abdomen: soft, nontender, nondistended. No hepatosplenomegaly. No bruits or masses. Good bowel sounds. Extremities: no cyanosis, clubbing, rash, edema Neuro: alert & orientedx3, cranial nerves grossly intact. moves all 4 extremities w/o difficulty. Affect pleasant  ASSESSMENT & PLAN:  1. Seizure: recent diagnosis 09/2019.  -On Keppra 500 mg bid.  - Followed by Neurology.   2. Chronic systolic CHF with ICM - TTE 11/30/17 25-30% RV normal. Echo 03/2018: EF40-45%RV normal.  - ECHO 5/20 EF ~ 25%. Has ICD.  - ECHO 12/2019 EF 30-35% RV norma; -NYHA III. Volume status stable. Continue lasix 20 mg daily.   - Continue Spironolactone 12.5 mg daily  -Continue Coreg  3.125 mg twice a day.  - Add entresto 24-26 mg twice a day.  - Not a candidate for advanced therapies due to ongoing alcohol abuse. - 3. CAD s/p CABG 12/07/2017 - No chest pain.  -ContinueASA  blockerandatorvastatin.  4. H/o VF arrest in setting of STEMI - stable Interrogation as above  5. ETOH abuse  Discussed alcohol cessation. He has no plans to quit.   6. H/O Noncompliance/ Poor Health Literacy  - Followed by HF Paramedicine.   Follow up in 2 weeks. Check BMET.    Difficult to manage due to poor insight and ongoing alcohol abuse. Continue HF Paramedicine.     Samarrah Tranchina NP-C  11:11 AM

## 2020-02-03 NOTE — Progress Notes (Signed)
Paramedicine Encounter   Patient ID: [name] , male,   DOB: [birth year],59 y.o.,  MRN: [id number]  [name] was seen at the HF clinic today and reported feeling well. He stated he has been compliant with his medications however it is hard to tell if he is taking medications as prescribed. Today Tonye Becket, NP, in adding Entresto 24/26 mg. I will follow up next week. His pillbox was refilled.   [name], EMT 02/03/2020   ACTION: Home visit completed Next visit planned for 1 week

## 2020-02-03 NOTE — Progress Notes (Signed)
CSW met with pt during clinic visit to check in.  Pt reports doing well and has been trying to take his medications but per paramedic appears not to be taking consistently.  Pt going to be restarted on Entresto 24/74m- CSW got samples so paramedic could fill pill box today and had [facility] request they have it shipped to patient.  Pt to come back in 2 weeks- CSW called [facility] and confirmed they have patient on the schedule for transportation that day.  CSW will continue to follow and assist as needed  JJorge Ny LHenderson ClinicDesk#: [phone]#: [phone]

## 2020-02-05 ENCOUNTER — Encounter (HOSPITAL_COMMUNITY): Payer: Medicaid Other

## 2020-02-10 ENCOUNTER — Other Ambulatory Visit (HOSPITAL_COMMUNITY): Payer: Self-pay

## 2020-02-10 NOTE — Progress Notes (Signed)
Paramedicine Encounter    Patient ID: [name], male    DOB: 1961-07-10, 59 y.o.   MRN: [id number]   No care team member to display  Patient Active Problem List   Diagnosis Date Noted  . 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 Seizure ([place]) 10/21/2019  . ICD (implantable cardioverter-defibrillator) in place 08/29/2019  . Chronic systolic heart failure ([place]) 08/25/2019  . Orthostatic hypotension 12/21/2017  . Syncope 12/20/2017  . 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 Coronary artery disease 12/07/2017  . Aspiration pneumonia (H[facility])     Current Outpatient Medications:  .  atorvastatin (LIPITOR) 80 MG tablet, Take 1 tablet (80 mg total) by mouth daily at 6 PM., Disp: 90 tablet, Rfl: 3 .  carvedilol (COREG) 3.125 MG tablet, Take 1 tablet (3.125 mg total) by mouth 2 (two) times daily with a meal., Disp: 60 tablet, Rfl: 0 .  folic acid (FOLVITE) 1 MG tablet, Take 1 tablet (1 mg total) by mouth daily., Disp: 30 tablet, Rfl: 0 .  levETIRAcetam (KEPPRA) 500 MG tablet, Take 1 tablet (500 mg total) by mouth 2 (two) times daily., Disp: 180 tablet, Rfl: 3 .  sacubitril-valsartan (ENTRESTO) 24-26 MG, Take 1 tablet by mouth 2 (two) times daily., Disp: 60 tablet, Rfl: 3 .  spironolactone (ALDACTONE) 25 MG tablet, Take 0.5 tablets (12.5 mg total) by mouth daily., Disp: 30 tablet, Rfl: 6 .  thiamine 100 MG tablet, Take 1 tablet (100 mg total) by mouth daily., Disp: 30 tablet, Rfl: 0 No Known Allergies   Social History   Socioeconomic History  . Marital status: Divorced    Spouse name: Not on file  . Number of children: Not on file  . Years of education: Not on file  . Highest education level: Not on file  Occupational History  . Not on file  Tobacco Use  . Smoking status: Never Smoker  . Smokeless tobacco: Never Used  Substance and Sexual Activity  . Alcohol use: Yes    Alcohol/week: 0.0 - 42.0 standard  drinks    Comment: 12/20/2017 "I quit drinking after I had the heart attack"  . Drug use: No  . Sexual activity: Not Currently  Other Topics Concern  . Not on file  Social History Narrative   Lives at home with his brother who is crippled. He takes care of his brother.    Right handed    Social Determinants of Health   Financial Resource Strain:   . Difficulty of Paying Living Expenses: Not on file  Food Insecurity:   . Worried About Charity fundraiser in the Last Year: Not on file  . Ran Out of Food in the Last Year: Not on file  Transportation Needs:   . Lack of Transportation (Medical): Not on file  . Lack of Transportation (Non-Medical): Not on file  Physical Activity:   . Days of Exercise per Week: Not on file  . Minutes of Exercise per Session: Not on file  Stress:   . Feeling of Stress : Not on file  Social Connections:   . Frequency of Communication with Friends and Family: Not on file  . Frequency of Social Gatherings with Friends and Family: Not on file  . Attends Religious Services: Not on file  . Active Member of Clubs or Organizations: Not on file  . Attends Archivist Meetings: Not on file  . Marital Status: Not on file  Intimate Partner Violence:   . Fear of Current or Ex-Partner: Not on file  . Emotionally Abused: Not on file  . Physically Abused: Not on file  . Sexually Abused: Not on file    Physical Exam Vitals reviewed.  HENT:     Head: Normocephalic.     Nose: Nose normal.     Mouth/Throat:     Mouth: Mucous membranes are moist.  Eyes:     Pupils: Pupils are equal, round, and reactive to light.  Cardiovascular:     Rate and Rhythm: Normal rate and regular rhythm.     Pulses: Normal pulses.     Heart sounds: Normal heart sounds.  Pulmonary:     Effort: Pulmonary effort is normal.     Breath sounds: Normal breath sounds.  Abdominal:     General: Abdomen is flat.     Palpations: Abdomen is soft.  Musculoskeletal:        General:  Normal range of motion.     Cervical back: Normal range of motion.     Right lower leg: No edema.     Left lower leg: No edema.  Skin:    General: Skin is warm and dry.     Capillary Refill: Capillary refill takes less than 2 seconds.  Neurological:     Mental Status: He is alert. Mental status is at baseline.  Psychiatric:        Mood and Affect: Mood normal.     Arrived for home visit for [facility] who was alert and oriented ambulating outside without difficulty breathing. Alieu reports feeling fine with no complaints other than intermittent dizziness which he says is normal for him. Vitals were obtained and are as noted.  Medications were reviewed and are as noted. Dinnis stated he drank a 12 pack of beer last night before going to bed. I advised the importance of decreasing the amount of beer he uses, he verbalized understanding.  Pill box was filled for [facility]. He agreed to Tuesdays for future visits. I will follow up next week for visit. Home visit complete.   Future Appointments  Date Time Provider Department Center  02/17/2020  9:00 AM [facility] PA/NP [facility] None  03/01/2020  7:10 AM [facility] DEVICE REMOTES [facility]  05/31/2020  7:10 AM [facility] DEVICE REMOTES [facility]  11/29/2020  7:10 AM [facility] DEVICE REMOTES [facility]     ACTION: Home visit completed Next visit planned for one week

## 2020-02-12 ENCOUNTER — Telehealth (HOSPITAL_COMMUNITY): Payer: Self-pay | Admitting: Licensed Clinical Social Worker

## 2020-02-12 NOTE — Telephone Encounter (Signed)
CSW received call from [facility] case manager to inquire if our clinic was registered as a PCP provider with Medicaid- CSW explained that we are a specialty provider and that I didn't think we would be able to be made a PCP on a Medicaid card.    Case worker asked for names of other [facility] providers in our areas to set up as a PCP- CSW provided with several options and informed that if they assisted in setting up an appt it would need to be on Tuesdays between 9am-11am so he can utilize [facility] for transportation  CSW will continue to follow and assist as needed  Burna Sis, LCSW Clinical Social Worker [facility] Desk#: [phone] Cell#: [phone]

## 2020-02-16 ENCOUNTER — Telehealth (HOSPITAL_COMMUNITY): Payer: Self-pay | Admitting: Licensed Clinical Social Worker

## 2020-02-16 NOTE — Progress Notes (Signed)
ADVANCED HF CLINIC NOTE  PCP: None  Primary Cardiologist: Dr Gala Romney   HPI: Lucio [facility] a 59 y.o.malewith h/o ETOH, VF Arrest, CAD s/p CABG 12/07/17, and ICM with EF 25-30%.  Admitted 12/6 - 12/14/17 with V-Fib arrest while chopping wood. Received CPR in the field. R/LHC with severe 3vD as below and cardiogenic shock requiring IABP. Required milrinone with continued shock. Pt improved and IABP removed 12/10, but pt had recurrent VF arrest so IABP replaced. Stabilized and underwent CABG 12/07/17. Tolerated gradual wean of meds and extubation with no furthe events. HF medications titrated as tolerated.  Admitted 12/27 ->12/28 with syncope and AKI thought to be 2/2 volume depletion on lasix, spiro, and Entresto. CT negative for ICH. Improved rapidly with holding meds and gentle IVF. Entresto stopped. Spiro cut back and switched to low dose losartan.  He had repeat echo 04/2019 that showed persistently reduced LVEF at 25%.    Last clinic visit w/ Dr. Gala Romney on 7/22, losartan was discontinued and pt placed back on low dose Entresto (has been tolerating well w/o recurrent syncope/ near syncope). Given EF < 35% despite medical therapy, he was referred to EP and seen by Dr. Ladona Ridgel who recommended ICD implantation.    He was admitted 08/29/19 for implantation of a [employer] single chamber ICD.   He had routine clinic f/u on 10/23/19 and while in exam room had a witnessed seizure with loss of pulse. CPR started with quick ROSC. He did not require shock. Taken to the ED and had another seizure. Neurology consulted. Started on Keppra. HS Trop 10>12.  ICD interrogated. No arrhythmias noted.  From HF perspective he had mild volume overload and had a dose of IV lasix and transitioned back to 40 mg PO daily. His previous HF meds were restarted w/ addition of spironolactone.  He had no recurrent seizures after initiation of Keprra. Neurology recommended continuation of Keppra 500  mg twice a day and he was instructed not to drive.   Had return clinic f/u 11/04/19 and reported full compliance w/ Keppra.  Ad[facility] to [facility] on 12/09/19 with recurrent seizures in setting of noncompliance with his Keppra.   Admitted 01/08/20 after syncopal episode. Had AKI so HF meds stopped. Given IV fluids and started back on coreg, spiro, and lasix. [name] was stopped.  Today he returns for HF follow up.Overall feeling fair. Complaining of cold symptoms. No fever or chills. SOB with exertion. Denies PND/Orthopnea. Appetite ok. No fever or chills.  Not weighing at home.  Missing medications 4-5 days a week. Continues to drink 5-6 beers a day.  ROS: All systems negative except as listed in HPI, PMH and Problem List.  SH:  Social History   Socioeconomic History  . Marital status: Divorced    Spouse name: Not on file  . Number of children: Not on file  . Years of education: Not on file  . Highest education level: Not on file  Occupational History  . Not on file  Tobacco Use  . Smoking status: Never Smoker  . Smokeless tobacco: Never Used  Substance and Sexual Activity  . Alcohol use: Yes    Alcohol/week: 0.0 - 42.0 standard drinks    Comment: 12/20/2017 "I quit drinking after I had the heart attack"  . Drug use: No  . Sexual activity: Not Currently  Other Topics Concern  . Not on file  Social History Narrative   Lives at home with his brother who is crippled. He takes  care of his brother.    Right handed    Social Determinants of Health   Financial Resource Strain:   . Difficulty of Paying Living Expenses: Not on file  Food Insecurity:   . Worried About Programme researcher, broadcasting/film/video in the Last Year: Not on file  . Ran Out of Food in the Last Year: Not on file  Transportation Needs:   . Lack of Transportation (Medical): Not on file  . Lack of Transportation (Non-Medical): Not on file  Physical Activity:   . Days of Exercise per Week: Not on file  . Minutes of  Exercise per Session: Not on file  Stress:   . Feeling of Stress : Not on file  Social Connections:   . Frequency of Communication with Friends and Family: Not on file  . Frequency of Social Gatherings with Friends and Family: Not on file  . Attends Religious Services: Not on file  . Active Member of Clubs or Organizations: Not on file  . Attends Banker Meetings: Not on file  . Marital Status: Not on file  Intimate Partner Violence:   . Fear of Current or Ex-Partner: Not on file  . Emotionally Abused: Not on file  . Physically Abused: Not on file  . Sexually Abused: Not on file    FH:  Family History  Problem Relation Age of Onset  . Heart attack Other     Past Medical History:  Diagnosis Date  . AICD (automatic cardioverter/defibrillator) present   . Arthritis    "hands; maybe my toes" (12/20/2017)  . CAD (coronary artery disease)   . Cardiac arrest ([facility]) 11/29/2017   hx VF arrest/notes 12/20/2017  . Coronary artery disease   . GERD (gastroesophageal reflux disease)   . H/O ETOH abuse    /notes 12/20/2017  . High cholesterol   . Hypertension   . Ischemic cardiomyopathy    a. 08/2019 s/p BSX D232 single lead AICD.  Marland Kitchen Seizure ([facility]) 10/21/2019  . Seizures ([facility]) ~ 2016; ~ 2017   "I've had a couple; I was at work" (12/20/2017)  . Syncope and collapse    "last few days" (12/20/2017)    Current Outpatient Medications  Medication Sig Dispense Refill  . atorvastatin (LIPITOR) 80 MG tablet Take 1 tablet (80 mg total) by mouth daily at 6 PM. 90 tablet 3  . carvedilol (COREG) 3.125 MG tablet Take 1 tablet (3.125 mg total) by mouth 2 (two) times daily with a meal. 60 tablet 0  . folic acid (FOLVITE) 1 MG tablet Take 1 tablet (1 mg total) by mouth daily. 30 tablet 0  . levETIRAcetam (KEPPRA) 500 MG tablet Take 1 tablet (500 mg total) by mouth 2 (two) times daily. 180 tablet 3  . sacubitril-valsartan (ENTRESTO) 24-26 MG Take 1 tablet by mouth 2 (two) times daily. 60  tablet 3  . spironolactone (ALDACTONE) 25 MG tablet Take 0.5 tablets (12.5 mg total) by mouth daily. 30 tablet 6  . thiamine 100 MG tablet Take 1 tablet (100 mg total) by mouth daily. 30 tablet 0   No current facility-administered medications for this encounter.    Vitals:   02/17/20 0855  BP: 108/64  Pulse: 91  SpO2: 97%  Weight: 68.2 kg (150 lb 6.4 oz)   Wt Readings from Last 3 Encounters:  02/17/20 68.2 kg (150 lb 6.4 oz)  02/10/20 67.1 kg (148 lb)  02/03/20 67.9 kg (149 lb 9.6 oz)   PHYSICAL EXAM: General: Walked in the  clinic. Well appearing. No resp difficulty HEENT: normal Neck: supple. no JVD. Carotids 2+ bilat; no bruits. No lymphadenopathy or thryomegaly appreciated. Cor: PMI nondisplaced. Regular rate & rhythm. No rubs, gallops or murmurs. Lungs: clear Abdomen: soft, nontender, nondistended. No hepatosplenomegaly. No bruits or masses. Good bowel sounds. Extremities: no cyanosis, clubbing, rash, edema Neuro: alert & orientedx3, cranial nerves grossly intact. moves all 4 extremities w/o difficulty. Affect pleasant  ASSESSMENT & PLAN:  1. Seizure: recent diagnosis 09/2019.  -On Keppra 500 mg bid.  - Followed by Neurology.   2. Chronic systolic CHF with ICM - TTE 11/30/17 25-30% RV normal. Echo 03/2018: EF40-45%RV normal.  - ECHO 5/20 EF ~ 25%. Has ICD.  - ECHO 12/2019 EF 30-35% RV norma; -NYHA III. Volume status stable. No medication changes today because he has not been taking meds in several days.  - Continue lasix 20 mg daily.   - Continue Spironolactone 12.5 mg daily  -Continue Coreg  3.125 mg twice a day.  - Continue entresto 24-26 mg twice a day.  - Not a candidate for advanced therapies due to ongoing alcohol abuse. - 3. CAD s/p CABG 12/07/2017 - No chest pain.  -ContinueASA  blockerandatorvastatin.  4. H/o VF arrest in setting of STEMI - stable Interrogation as above  5. ETOH abuse  Discussed alcohol cessation. He has no plans to quit.     6. H/O Noncompliance/ Poor Health Literacy  - Followed by HF Paramedicine. Ongoing medication noncompliance.    Discussed medication compliance.  Continue HF Paramedicine. Difficult to manage with poor insight.   Follow up in 6-8 weeks.   Deontray Hunnicutt NP-C  9:11 AM

## 2020-02-16 NOTE — Telephone Encounter (Signed)
CSW called pt to ensure he remembered appt tomorrow and that he had heard from [facility] confirming his transport.  Pt reports he is still able to come to appt tomorrow and that he has heard from [facility] confirming his pick up time for 8am.  CSW will continue to follow and assist as needed  Burna Sis, LCSW Clinical Social Worker [facility] Desk#: [phone] Cell#: [phone]

## 2020-02-17 ENCOUNTER — Other Ambulatory Visit: Payer: Self-pay | Admitting: Cardiology

## 2020-02-17 ENCOUNTER — Other Ambulatory Visit (HOSPITAL_COMMUNITY): Payer: Self-pay

## 2020-02-17 ENCOUNTER — Ambulatory Visit (HOSPITAL_COMMUNITY)
Admission: RE | Admit: 2020-02-17 | Discharge: 2020-02-17 | Disposition: A | Discharge status: Home / Self Care | Payer: Medicaid Other | Source: Ambulatory Visit | Attending: Adult Health | Admitting: Adult Health

## 2020-02-17 ENCOUNTER — Other Ambulatory Visit: Payer: Self-pay

## 2020-02-17 ENCOUNTER — Encounter (HOSPITAL_COMMUNITY): Payer: Self-pay

## 2020-02-17 VITALS — BP 108/64 | HR 91 | Wt 150.4 lb

## 2020-02-17 DIAGNOSIS — Z8674 Personal history of sudden cardiac arrest: Secondary | ICD-10-CM | POA: Diagnosis not present

## 2020-02-17 DIAGNOSIS — I110 Hypertensive heart disease with heart failure: Secondary | ICD-10-CM | POA: Insufficient documentation

## 2020-02-17 DIAGNOSIS — Z951 Presence of aortocoronary bypass graft: Secondary | ICD-10-CM | POA: Insufficient documentation

## 2020-02-17 DIAGNOSIS — I5022 Chronic systolic (congestive) heart failure: Secondary | ICD-10-CM

## 2020-02-17 DIAGNOSIS — E7800 Pure hypercholesterolemia, unspecified: Secondary | ICD-10-CM | POA: Diagnosis not present

## 2020-02-17 DIAGNOSIS — G40909 Epilepsy, unspecified, not intractable, without status epilepticus: Secondary | ICD-10-CM | POA: Insufficient documentation

## 2020-02-17 DIAGNOSIS — I2510 Atherosclerotic heart disease of native coronary artery without angina pectoris: Secondary | ICD-10-CM | POA: Diagnosis not present

## 2020-02-17 DIAGNOSIS — Z8249 Family history of ischemic heart disease and other diseases of the circulatory system: Secondary | ICD-10-CM | POA: Diagnosis not present

## 2020-02-17 DIAGNOSIS — Z79899 Other long term (current) drug therapy: Secondary | ICD-10-CM | POA: Diagnosis not present

## 2020-02-17 DIAGNOSIS — F1010 Alcohol abuse, uncomplicated: Secondary | ICD-10-CM | POA: Insufficient documentation

## 2020-02-17 DIAGNOSIS — K219 Gastro-esophageal reflux disease without esophagitis: Secondary | ICD-10-CM | POA: Insufficient documentation

## 2020-02-17 DIAGNOSIS — R569 Unspecified convulsions: Secondary | ICD-10-CM

## 2020-02-17 DIAGNOSIS — Z9114 Patient's other noncompliance with medication regimen: Secondary | ICD-10-CM | POA: Insufficient documentation

## 2020-02-17 DIAGNOSIS — I255 Ischemic cardiomyopathy: Secondary | ICD-10-CM | POA: Insufficient documentation

## 2020-02-17 DIAGNOSIS — Z9119 Patient's noncompliance with other medical treatment and regimen: Secondary | ICD-10-CM | POA: Diagnosis not present

## 2020-02-17 DIAGNOSIS — I252 Old myocardial infarction: Secondary | ICD-10-CM | POA: Diagnosis not present

## 2020-02-17 DIAGNOSIS — Z95810 Presence of automatic (implantable) cardiac defibrillator: Secondary | ICD-10-CM | POA: Insufficient documentation

## 2020-02-17 DIAGNOSIS — Z91199 Patient's noncompliance with other medical treatment and regimen due to unspecified reason: Secondary | ICD-10-CM

## 2020-02-17 NOTE — Progress Notes (Addendum)
CSW met with pt during clinic visit.  Pt has been very inconsistent with taking medications but just states he forgets.  CSW offered to place alarms on patient phone but he is not agreeable at this time.  States sometimes its an issue of going to bed early and not eating dinner- CSW discussed getting something easy to eat for dinner like a snack or a premade meals to make it easy to eat and take medications before going to bed.  Pt more consistent with taking medications in the morning but per report often takes them closer to lunch time.  Pt to come back for check up in 6 weeks- CSW called [facility] to organize transport to that appt- they will put him on the list  CSW also spoke with pt about getting established with PCP at [place]- pt is agreeable- CSW set up pt with appt at [place] to establish PCP on 3/16- scheduled [facility] transport to appt and updated [place] case worker who will work on getting pt PCP changed on his Medicaid card  CSW will continue to follow and assist as needed  [name], [place] Social Worker [facility] Desk#: [phone] Cell#: [phone]

## 2020-02-17 NOTE — Addendum Note (Signed)
Encounter addended by: Burna Sis, LCSW on: 02/17/2020 11:39 AM  Actions taken: Clinical Note Signed

## 2020-02-17 NOTE — Addendum Note (Signed)
Encounter addended by: Burna Sis, LCSW on: 02/17/2020 11:06 AM  Actions taken: Clinical Note Signed

## 2020-02-17 NOTE — Patient Instructions (Signed)
Please follow up with the [facility] in 8 weeks.  At the [facility], you and your health needs are our priority. As part of our continuing mission to provide you with exceptional heart care, we have created designated Provider Care Teams. These Care Teams include your primary Cardiologist (physician) and Advanced Practice Providers (APPs- Physician Assistants and Nurse Practitioners) who all work together to provide you with the care you need, when you need it.   You may see any of the following providers on your designated Care Team at your next follow up: Marland Kitchen Dr Arvilla Meres . Dr Marca Ancona . Tonye Becket, NP . Robbie Lis, PA . Karle Plumber, PharmD   Please be sure to bring in all your medications bottles to every appointment.

## 2020-02-17 NOTE — Progress Notes (Signed)
Paramedicine Encounter    Patient ID: [name], male    DOB: 06/08/1961, 59 y.o.   MRN: [id number]   No care team member to display  Patient Active Problem List   Diagnosis Date Noted  . 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 Seizure ([place]) 10/21/2019  . ICD (implantable cardioverter-defibrillator) in place 08/29/2019  . Chronic systolic heart failure ([place]) 08/25/2019  . Orthostatic hypotension 12/21/2017  . Syncope 12/20/2017  . 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 Coronary artery disease 12/07/2017  . Aspiration pneumonia (HCC)     Current Outpatient Medications:  .  atorvastatin (LIPITOR) 80 MG tablet, Take 1 tablet (80 mg total) by mouth daily at 6 PM., Disp: 90 tablet, Rfl: 3 .  carvedilol (COREG) 3.125 MG tablet, Take 1 tablet (3.125 mg total) by mouth 2 (two) times daily with a meal., Disp: 60 tablet, Rfl: 0 .  folic acid (FOLVITE) 1 MG tablet, Take 1 tablet (1 mg total) by mouth daily., Disp: 30 tablet, Rfl: 0 .  levETIRAcetam (KEPPRA) 500 MG tablet, Take 1 tablet (500 mg total) by mouth 2 (two) times daily., Disp: 180 tablet, Rfl: 3 .  sacubitril-valsartan (ENTRESTO) 24-26 MG, Take 1 tablet by mouth 2 (two) times daily., Disp: 60 tablet, Rfl: 3 .  spironolactone (ALDACTONE) 25 MG tablet, Take 0.5 tablets (12.5 mg total) by mouth daily., Disp: 30 tablet, Rfl: 6 .  thiamine 100 MG tablet, Take 1 tablet (100 mg total) by mouth daily., Disp: 30 tablet, Rfl: 0 No Known Allergies   Social History   Socioeconomic History  . Marital status: Divorced    Spouse name: Not on file  . Number of children: Not on file  . Years of education: Not on file  . Highest education level: Not on file  Occupational History  . Not on file  Tobacco Use  . Smoking status: Never Smoker  . Smokeless tobacco: Never Used  Substance and Sexual Activity  . Alcohol use: Yes    Alcohol/week: 0.0 - 42.0 standard  drinks    Comment: 12/20/2017 "I quit drinking after I had the heart attack"  . Drug use: No  . Sexual activity: Not Currently  Other Topics Concern  . Not on file  Social History Narrative   Lives at home with his brother who is crippled. He takes care of his brother.    Right handed    Social Determinants of Health   Financial Resource Strain:   . Difficulty of Paying Living Expenses: Not on file  Food Insecurity:   . Worried About Charity fundraiser in the Last Year: Not on file  . Ran Out of Food in the Last Year: Not on file  Transportation Needs:   . Lack of Transportation (Medical): Not on file  . Lack of Transportation (Non-Medical): Not on file  Physical Activity:   . Days of Exercise per Week: Not on file  . Minutes of Exercise per Session: Not on file  Stress:   . Feeling of Stress : Not on file  Social Connections:   . Frequency of Communication with Friends and Family: Not on file  . Frequency of Social Gatherings with Friends and Family: Not on file  . Attends Religious Services: Not on file  . Active Member of Clubs or Organizations: Not on file  . Attends Archivist Meetings: Not on file  . Marital Status: Not on file  Intimate Partner Violence:   . Fear of Current or Ex-Partner: Not on file  . Emotionally Abused: Not on file  . Physically Abused: Not on file  . Sexually Abused: Not on file    Physical Exam Vitals reviewed.  HENT:     Head: Normocephalic.     Nose: Congestion present.  Eyes:     Pupils: Pupils are equal, round, and reactive to light.  Cardiovascular:     Rate and Rhythm: Normal rate and regular rhythm.     Pulses: Normal pulses.     Heart sounds: Normal heart sounds.  Pulmonary:     Effort: Pulmonary effort is normal.     Breath sounds: Normal breath sounds.  Abdominal:     General: Abdomen is flat.     Palpations: Abdomen is soft.  Musculoskeletal:        General: Normal range of motion.     Right lower leg: No  edema.     Left lower leg: No edema.     Comments: Right ankle pain.   Skin:    General: Skin is warm and dry.     Capillary Refill: Capillary refill takes less than 2 seconds.  Neurological:     Mental Status: He is alert. Mental status is at baseline.  Psychiatric:        Mood and Affect: Mood normal.     Arrived for clinic visit for Mr. Mohr was who alert and oriented but seemed sluggish today. He reports feeling "okay" but having a productive cough. Timon denied fever, chills or body aches. Kelon stated he had been around some people who have been sick. Leanthony noted to have missed multiple doses of his medications this past week. Sat-Weds (PM) Fri-Sat (AM)  I plan to come out and see him again next week on Tuesday at home. Colten agreed with same.   Refills:  (To [facility] from [facility])  Vitamin B Folic Acid   [name] and I are working together with [employer] [facility] to gain an established Primary Care doctor.   Weight this week- 150lbs  BP: 108/64  HR: 91 O2: 97%    Future Appointments  Date Time Provider Department Center  03/01/2020  7:10 AM [facility] DEVICE REMOTES [facility]  04/06/2020  9:30 AM [facility] PA/NP [facility] None  05/31/2020  7:10 AM [facility] DEVICE REMOTES [facility]  11/29/2020  7:10 AM [facility] DEVICE REMOTES [facility]     ACTION: Home visit completed Next visit planned for Next Tuesday at 0900.

## 2020-02-19 ENCOUNTER — Encounter (HOSPITAL_COMMUNITY): Payer: Medicaid Other

## 2020-02-19 MED FILL — VITAMIN B-1 100 MG TABS: 100 | 100 days supply | Qty: 100 | Fill #0

## 2020-02-19 MED FILL — FOLIC ACID 1 MG TABS: 1 | 30 days supply | Qty: 30 | Fill #0

## 2020-02-24 ENCOUNTER — Telehealth (HOSPITAL_COMMUNITY): Payer: Self-pay

## 2020-02-24 NOTE — Telephone Encounter (Signed)
Contacted patient many times today with no success of reaching him to schedule home visit. I will continue to reach out to meet with patient.

## 2020-03-01 ENCOUNTER — Other Ambulatory Visit (HOSPITAL_COMMUNITY): Payer: Self-pay

## 2020-03-01 ENCOUNTER — Telehealth (HOSPITAL_COMMUNITY): Payer: Self-pay

## 2020-03-01 ENCOUNTER — Ambulatory Visit (INDEPENDENT_AMBULATORY_CARE_PROVIDER_SITE_OTHER): Payer: Medicaid Other | Admitting: *Deleted

## 2020-03-01 ENCOUNTER — Telehealth: Payer: Self-pay | Admitting: Emergency Medicine

## 2020-03-01 DIAGNOSIS — Z95810 Presence of automatic (implantable) cardiac defibrillator: Secondary | ICD-10-CM | POA: Diagnosis not present

## 2020-03-01 LAB — CUP PACEART REMOTE DEVICE CHECK
Battery Remaining Longevity: 174 mo
Battery Remaining Percentage: 100 %
Brady Statistic RV Percent Paced: 0 %
Date Time Interrogation Session: 20210308010100
HighPow Impedance: 58 Ohm
Implantable Lead Implant Date: 20200904
Implantable Lead Location: 753860
Implantable Lead Model: 293
Implantable Lead Serial Number: 443717
Implantable Pulse Generator Implant Date: 20200904
Lead Channel Impedance Value: 454 Ohm
Lead Channel Setting Pacing Amplitude: 3.5 V
Lead Channel Setting Pacing Pulse Width: 0.4 ms
Lead Channel Setting Sensing Sensitivity: 0.5 mV
Pulse Gen Serial Number: 271403

## 2020-03-01 NOTE — Telephone Encounter (Signed)
Spoke to [place] who agreed to 0900 visit tomorrow.

## 2020-03-01 NOTE — Telephone Encounter (Signed)
Received alert for nightly average  HR of > 90 bpm and patient had HR of 120 bpm on transmission. Patient had community paramedic visit today and was documented that patient has not been taking his medications regularly and has increased his ETOH intake over the past few weeks. Patient denies CP, SOB , syncope or dizziness. Patient reports drinking 6 beers / day. Encouraged patient to take his medications regularly. Overdue for follow up with EP AP or Dr Ladona Ridgel because he missed his last 2 appointments. Patient reports he has no transportation to appointments at this time and he lives in [place]. Will have scheduler set up appointment for EP visit.

## 2020-03-01 NOTE — Progress Notes (Signed)
Paramedicine Encounter    Patient ID: [name], male    DOB: 03/24/1961, 59 y.o.   MRN: [id number]   No care team member to display  Patient Active Problem List   Diagnosis Date Noted  . 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 Seizure (HCC) 10/21/2019  . ICD (implantable cardioverter-defibrillator) in place 08/29/2019  . Chronic systolic heart failure (HCC) 08/25/2019  . Orthostatic hypotension 12/21/2017  . Syncope 12/20/2017  . 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 Coronary artery disease 12/07/2017  . Aspiration pneumonia (HCC)     Current Outpatient Medications:  .  atorvastatin (LIPITOR) 80 MG tablet, Take 1 tablet (80 mg total) by mouth daily at 6 PM., Disp: 90 tablet, Rfl: 3 .  carvedilol (COREG) 3.125 MG tablet, Take 1 tablet (3.125 mg total) by mouth 2 (two) times daily with a meal., Disp: 60 tablet, Rfl: 0 .  folic acid (FOLVITE) 1 MG tablet, TAKE 1 TABLET (1 MG TOTAL) BY MOUTH DAILY., Disp: 30 tablet, Rfl: 0 .  levETIRAcetam (KEPPRA) 500 MG tablet, Take 1 tablet (500 mg total) by mouth 2 (two) times daily., Disp: 180 tablet, Rfl: 3 .  sacubitril-valsartan (ENTRESTO) 24-26 MG, Take 1 tablet by mouth 2 (two) times daily., Disp: 60 tablet, Rfl: 3 .  spironolactone (ALDACTONE) 25 MG tablet, Take 0.5 tablets (12.5 mg total) by mouth daily., Disp: 30 tablet, Rfl: 6 .  thiamine 100 MG tablet, TAKE 1 TABLET (100 MG TOTAL) BY MOUTH DAILY., Disp: 30 tablet, Rfl: 0 No Known Allergies   Social History   Socioeconomic History  . Marital status: Divorced    Spouse name: Not on file  . Number of children: Not on file  . Years of education: Not on file  . Highest education level: Not on file  Occupational History  . Not on file  Tobacco Use  . Smoking status: Never Smoker  . Smokeless tobacco: Never Used  Substance and Sexual Activity  . Alcohol use: Yes    Alcohol/week: 0.0 - 42.0 standard  drinks    Comment: 12/20/2017 "I quit drinking after I had the heart attack"  . Drug use: No  . Sexual activity: Not Currently  Other Topics Concern  . Not on file  Social History Narrative   Lives at home with his brother who is crippled. He takes care of his brother.    Right handed    Social Determinants of Health   Financial Resource Strain:   . Difficulty of Paying Living Expenses: Not on file  Food Insecurity:   . Worried About Programme researcher, broadcasting/film/video in the Last Year: Not on file  . Ran Out of Food in the Last Year: Not on file  Transportation Needs:   . Lack of Transportation (Medical): Not on file  . Lack of Transportation (Non-Medical): Not on file  Physical Activity:   . Days of Exercise per Week: Not on file  . Minutes of Exercise per Session: Not on file  Stress:   . Feeling of Stress : Not on file  Social Connections:   . Frequency of Communication with Friends and Family: Not on file  . Frequency of Social Gatherings with Friends and Family: Not on file  . Attends Religious Services: Not on file  . Active Member of Clubs or Organizations: Not on file  . Attends Banker Meetings: Not on file  . Marital Status: Not on file  Intimate Partner Violence:   . Fear of Current or Ex-Partner: Not on file  . Emotionally Abused: Not on file  . Physically Abused: Not on file  . Sexually Abused: Not on file    Physical Exam HENT:     Head: Normocephalic.     Nose: Nose normal.     Mouth/Throat:     Mouth: Mucous membranes are moist.  Eyes:     Pupils: Pupils are equal, round, and reactive to light.  Cardiovascular:     Rate and Rhythm: Normal rate and regular rhythm.     Pulses: Normal pulses.     Heart sounds: Normal heart sounds.  Pulmonary:     Effort: Pulmonary effort is normal.     Breath sounds: Normal breath sounds.  Abdominal:     General: Abdomen is flat.     Palpations: Abdomen is soft.  Musculoskeletal:        General: Normal range of  motion.     Cervical back: Normal range of motion.     Right lower leg: Edema present.     Left lower leg: Edema present.  Skin:    General: Skin is warm and dry.     Capillary Refill: Capillary refill takes less than 2 seconds.  Neurological:     Mental Status: He is alert. Mental status is at baseline.  Psychiatric:        Mood and Affect: Mood normal.      Arrived for home visit for [name] who was alert and oriented stating he has been drinking a lot lately. [name] went without his medications last week. [name] was instructed to be sure to answer phone calls by paramedic to confirm weekly visits. He verbalized understanding. Medications were reviewed and confirmed. Pill box was filled accordingly. Vitals were obtained and are as noted. [name] agreed to one week visit next Monday. Edema noted to both legs, Lasix placed in pill box every other day to assist with same. Patient stated he has been eating poorly and not taking his medicines a long with drinking a lot of beer. Patient educated on same. Home visit complete.   Refills-  Vit B Folic Acid    Weight- 324MWN   Future Appointments  Date Time Provider [place]  03/09/2020 10:30 AM Gildardo Pounds, NP [facility] None  04/06/2020  9:30 AM [facility] PA/NP [facility] None  05/31/2020  7:10 AM [facility] DEVICE REMOTES [facility]  11/29/2020  7:10 AM [facility] DEVICE REMOTES [facility]     ACTION: Home visit completed Next visit planned for one week

## 2020-03-02 NOTE — Progress Notes (Signed)
ICD Remote  

## 2020-03-04 NOTE — Telephone Encounter (Signed)
Stop drinking and take meds as prescribed. GT

## 2020-03-05 ENCOUNTER — Telehealth (HOSPITAL_COMMUNITY): Payer: Self-pay | Admitting: Licensed Clinical Social Worker

## 2020-03-05 NOTE — Telephone Encounter (Signed)
CSW called [facility] transport and confirmed pt is set up for transport [facility] appt on Tuesday 3/16- CSW had called pt yesterday and left message with his brother to remind pt of appt next week.  CSW will continue to follow and assist as needed  Burna Sis, LCSW Clinical Social Worker [facility] Desk#: [phone] Cell#: [phone]

## 2020-03-08 ENCOUNTER — Telehealth (HOSPITAL_COMMUNITY): Payer: Self-pay | Admitting: Licensed Clinical Social Worker

## 2020-03-08 NOTE — Telephone Encounter (Signed)
CSW reached out to [facility] to help coordinate getting patient a follow up appt with them as I help to arrange pt appts and transport to and from appts through [facility] (pt has to be set up with appt between 9am and 11am in order for [facility] to transfer)  Pt able to be set up on 3/30 at 10:05am at [facility]  CSW spoke with [facility] transport this morning and set up ride to get him to appt- will also reach out the day before the appt to remind pt.  CSW then called pt and reminded of [facility] appt tomorrow- pt confirms he is able to come- will also meet with Community Paramedic during that appt- ptto bring his meds at that time so [employer] can help with pillbox.  CSW will continue to follow and assist as needed  Burna Sis, LCSW Clinical Social Worker [facility] Desk#: [phone] Cell#: [phone]

## 2020-03-09 ENCOUNTER — Encounter: Payer: Self-pay | Admitting: Nurse Practitioner

## 2020-03-09 ENCOUNTER — Other Ambulatory Visit: Payer: Self-pay

## 2020-03-09 ENCOUNTER — Ambulatory Visit: Payer: Medicaid Other | Attending: Nurse Practitioner | Admitting: Nurse Practitioner

## 2020-03-09 ENCOUNTER — Ambulatory Visit: Payer: Medicaid Other | Admitting: Student

## 2020-03-09 VITALS — BP 147/92 | HR 96 | Temp 97.7°F | Ht 70.0 in | Wt 148.0 lb

## 2020-03-09 DIAGNOSIS — Z7689 Persons encountering health services in other specified circumstances: Secondary | ICD-10-CM | POA: Diagnosis not present

## 2020-03-09 DIAGNOSIS — I10 Essential (primary) hypertension: Secondary | ICD-10-CM | POA: Diagnosis not present

## 2020-03-09 DIAGNOSIS — Z862 Personal history of diseases of the blood and blood-forming organs and certain disorders involving the immune mechanism: Secondary | ICD-10-CM | POA: Insufficient documentation

## 2020-03-09 DIAGNOSIS — E785 Hyperlipidemia, unspecified: Secondary | ICD-10-CM | POA: Diagnosis not present

## 2020-03-09 DIAGNOSIS — Z8249 Family history of ischemic heart disease and other diseases of the circulatory system: Secondary | ICD-10-CM | POA: Diagnosis not present

## 2020-03-09 DIAGNOSIS — Z95810 Presence of automatic (implantable) cardiac defibrillator: Secondary | ICD-10-CM | POA: Diagnosis not present

## 2020-03-09 DIAGNOSIS — I2510 Atherosclerotic heart disease of native coronary artery without angina pectoris: Secondary | ICD-10-CM | POA: Insufficient documentation

## 2020-03-09 DIAGNOSIS — I255 Ischemic cardiomyopathy: Secondary | ICD-10-CM | POA: Insufficient documentation

## 2020-03-09 DIAGNOSIS — Z951 Presence of aortocoronary bypass graft: Secondary | ICD-10-CM | POA: Diagnosis not present

## 2020-03-09 DIAGNOSIS — K219 Gastro-esophageal reflux disease without esophagitis: Secondary | ICD-10-CM | POA: Insufficient documentation

## 2020-03-09 DIAGNOSIS — G40909 Epilepsy, unspecified, not intractable, without status epilepticus: Secondary | ICD-10-CM | POA: Insufficient documentation

## 2020-03-09 DIAGNOSIS — Z1211 Encounter for screening for malignant neoplasm of colon: Secondary | ICD-10-CM | POA: Diagnosis not present

## 2020-03-09 DIAGNOSIS — Z131 Encounter for screening for diabetes mellitus: Secondary | ICD-10-CM | POA: Diagnosis not present

## 2020-03-09 DIAGNOSIS — E7800 Pure hypercholesterolemia, unspecified: Secondary | ICD-10-CM | POA: Diagnosis not present

## 2020-03-09 DIAGNOSIS — Z79899 Other long term (current) drug therapy: Secondary | ICD-10-CM | POA: Diagnosis not present

## 2020-03-09 DIAGNOSIS — Z8674 Personal history of sudden cardiac arrest: Secondary | ICD-10-CM | POA: Diagnosis not present

## 2020-03-09 NOTE — Progress Notes (Signed)
Assessment & Plan:  [name] was seen today for establish care.  Diagnoses and all orders for this visit:  Encounter to establish care  Colon cancer screening -     Ambulatory referral to Gastroenterology  History of anemia -     CBC -     Iron, TIBC and Ferritin Panel  Encounter for screening for diabetes mellitus -     Hemoglobin A1c  Dyslipidemia, goal LDL below 100 -     Lipid panel    Patient has been counseled on age-appropriate routine health concerns for screening and prevention. These are reviewed and up-to-date. Referrals have been placed accordingly. Immunizations are up-to-date or declined.    Subjective:   Chief Complaint  Patient presents with  . Establish Care    Pt. is here to establish care.    HPI [name] 59 y.o. male presents to office today to establish care.  He has a SW that comes to his home to assist with medication management. Scheduled for Thursday of this week.    Pertinent PMH includes Vfib arrest, CAD with CABG (2018), ICM with EF 25-30% with ICD placement on 08-2019. (seeing Dr. Gala Romney).  Per Cardiology notes on 12-16-2019 He had routine clinic f/u on 10/23/19 and while in exam room had a witnessed seizurewith loss of pulse. CPR started with quick ROSC. He did not require shock.Taken to the ED and had another seizure. Neurology consulted. Started on Keppra. Admitted to [facility] on 12/09/19 with recurrent seizures in setting of noncompliance with his Keppra (followed by Dr. Lucia Gaskins Neurology).  He is not eligible for paramedicine as he lives in [place].   Throughout obtaining most of his health he laughs inappropriately and even asks me how long he has to live. Still consuming alcohol.   Endorses medication compliance taking atorvastatin 80 mg, carvedilol 3.125 mg BID, keppra 500 mg BID, entresto 24-26 mg BID, spironolactone 12.5 mg daily, thiamine and folic acid. He does not have his medications with him today. At  one point going over his medications he says that sounds right. Blood pressure is elevated today.   He denies any Chest pain, shortness of breath, palpitations or BLE edema.   Review of Systems  Constitutional: Negative for fever, malaise/fatigue and weight loss.  HENT: Negative.  Negative for nosebleeds.   Eyes: Negative.  Negative for blurred vision, double vision and photophobia.  Respiratory: Negative.  Negative for cough and shortness of breath.   Cardiovascular: Negative.  Negative for chest pain, palpitations and leg swelling.  Gastrointestinal: Negative.  Negative for heartburn, nausea and vomiting.  Musculoskeletal: Negative.  Negative for myalgias.  Neurological: Negative.  Negative for dizziness, focal weakness, seizures and headaches.  Psychiatric/Behavioral: Negative.  Negative for suicidal ideas.    Past Medical History:  Diagnosis Date  . AICD (automatic cardioverter/defibrillator) present   . Arthritis    "hands; maybe my toes" (12/20/2017)  . CAD (coronary artery disease)   . Cardiac arrest (HCC) 11/29/2017   hx VF arrest/notes 12/20/2017  . Coronary artery disease   . GERD (gastroesophageal reflux disease)   . H/O ETOH abuse    /notes 12/20/2017  . High cholesterol   . Hypertension   . Ischemic cardiomyopathy    a. 08/2019 s/p BSX D232 single lead AICD.  Marland Kitchen Seizure (HCC) 10/21/2019  . Seizures (HCC) ~ 2016; ~ 2017   "I've had a couple; I was at work" (12/20/2017)  . Syncope and collapse    "last few  days" (12/20/2017)    Past Surgical History:  Procedure Laterality Date  . CARDIAC CATHETERIZATION    . CORONARY ARTERY BYPASS GRAFT N/A 12/07/2017   Procedure: CORONARY ARTERY BYPASS GRAFTING (CABG) times four using left internal mammary artery and right saphenous vein using endoscope for harvest.;  Surgeon: Ivin Poot, MD;  Location: [facility];  Service: Open Heart Surgery;  Laterality: N/A;  . IABP INSERTION N/A 11/29/2017   Procedure: IABP Insertion;   Surgeon: Nelva Bush, MD;  Location: [place] [facility];  Service: Cardiovascular;  Laterality: N/A;  . IABP INSERTION N/A 12/03/2017   Procedure: IABP INSERTION;  Surgeon: Jolaine Artist, MD;  Location: [place] [facility];  Service: Cardiovascular;  Laterality: N/A;  . ICD IMPLANT N/A 08/29/2019   Procedure: ICD IMPLANT;  Surgeon: Evans Lance, MD;  Location: [place] [facility];  Service: Cardiovascular;  Laterality: N/A;  . LEFT HEART CATH AND CORONARY ANGIOGRAPHY N/A 11/29/2017   Procedure: LEFT HEART CATH AND CORONARY ANGIOGRAPHY;  Surgeon: Nelva Bush, MD;  Location: [place] [facility];  Service: Cardiovascular;  Laterality: N/A;  . RIGHT HEART CATH N/A 11/29/2017   Procedure: RIGHT HEART CATH;  Surgeon: Jolaine Artist, MD;  Location: [facility];  Service: Cardiovascular;  Laterality: N/A;  . TEE WITHOUT CARDIOVERSION N/A 12/07/2017   Procedure: TRANSESOPHAGEAL ECHOCARDIOGRAM (TEE);  Surgeon: Prescott Gum, Collier Salina, MD;  Location: [facility];  Service: Open Heart Surgery;  Laterality: N/A;  . TONSILLECTOMY AND ADENOIDECTOMY  ~ 1972    Family History  Problem Relation Age of Onset  . Heart attack Other   . Diabetes Neg Hx     Social History Reviewed with no changes to be made today.   Outpatient Medications Prior to Visit  Medication Sig Dispense Refill  . atorvastatin (LIPITOR) 80 MG tablet Take 1 tablet (80 mg total) by mouth daily at 6 PM. 90 tablet 3  . carvedilol (COREG) 3.125 MG tablet Take 1 tablet (3.125 mg total) by mouth 2 (two) times daily with a meal. 60 tablet 0  . folic acid (FOLVITE) 1 MG tablet TAKE 1 TABLET (1 MG TOTAL) BY MOUTH DAILY. 30 tablet 0  . levETIRAcetam (KEPPRA) 500 MG tablet Take 1 tablet (500 mg total) by mouth 2 (two) times daily. 180 tablet 3  . sacubitril-valsartan (ENTRESTO) 24-26 MG Take 1 tablet by mouth 2 (two) times daily. 60 tablet 3  . spironolactone (ALDACTONE) 25 MG tablet Take 0.5 tablets (12.5 mg total) by mouth daily. 30  tablet 6  . thiamine 100 MG tablet TAKE 1 TABLET (100 MG TOTAL) BY MOUTH DAILY. 30 tablet 0   No facility-administered medications prior to visit.    No Known Allergies     Objective:    BP (!) 147/92 (BP Location: Right Arm, Patient Position: Sitting, Cuff Size: Normal)   Pulse 96   Temp 97.7 F (36.5 C) (Temporal)   Ht 5\' 10"  (1.778 m)   Wt 148 lb (67.1 kg)   SpO2 98%   BMI 21.24 kg/m  Wt Readings from Last 3 Encounters:  03/09/20 148 lb (67.1 kg)  02/17/20 150 lb 6.4 oz (68.2 kg)  02/10/20 148 lb (67.1 kg)    Physical Exam Vitals and nursing note reviewed.  Constitutional:      Appearance: He is well-developed.     Comments: disheveled  HENT:     Head: Normocephalic and atraumatic.  Cardiovascular:     Rate and Jose: Normal rate and regular  Jose.     Heart sounds: Normal heart sounds. No murmur. No friction rub. No gallop.   Pulmonary:     Effort: Pulmonary effort is normal. No tachypnea or respiratory distress.     Breath sounds: Normal breath sounds. No decreased breath sounds, wheezing, rhonchi or rales.  Chest:     Chest wall: No tenderness.  Abdominal:     General: Bowel sounds are normal.     Palpations: Abdomen is soft.  Musculoskeletal:        General: Normal range of motion.     Cervical back: Normal range of motion.  Skin:    General: Skin is warm and dry.  Neurological:     Mental Status: He is oriented to person, place, and time.     Coordination: Coordination normal.  Psychiatric:        Mood and Affect: Affect is inappropriate.        Behavior: Behavior is cooperative.          Patient has been counseled extensively about nutrition and exercise as well as the importance of adherence with medications and regular follow-up. The patient was given clear instructions to go to ER or return to medical center if symptoms don't improve, worsen or new problems develop. The patient verbalized understanding.   Follow-up: Return in about 3 months  (around 06/09/2020).   Claiborne Rigg, FNP-BC [facility] [place], [place] [phone]   03/11/2020, 10:14 AM

## 2020-03-10 LAB — LIPID PANEL
Chol/HDL Ratio: 2.1 ratio (ref 0.0–5.0)
Cholesterol, Total: 195 mg/dL (ref 100–199)
HDL: 95 mg/dL (ref >39)
LDL Chol Calc (NIH): 86 mg/dL (ref 0–99)
Triglycerides: 80 mg/dL (ref 0–149)
VLDL Cholesterol Cal: 14 mg/dL (ref 5–40)

## 2020-03-10 LAB — CBC
Hematocrit: 41.8 % (ref 37.5–51.0)
Hemoglobin: 14.8 g/dL (ref 13.0–17.7)
MCH: 33.8 pg — ABNORMAL HIGH (ref 26.6–33.0)
MCHC: 35.4 g/dL (ref 31.5–35.7)
MCV: 95 fL (ref 79–97)
Platelets: 186 10*3/uL (ref 150–450)
RBC: 4.38 x10E6/uL (ref 4.14–5.80)
RDW: 11.7 % (ref 11.6–15.4)
WBC: 6.5 10*3/uL (ref 3.4–10.8)

## 2020-03-10 LAB — HEMOGLOBIN A1C
Est. average glucose Bld gHb Est-mCnc: 97 mg/dL
Hgb A1c MFr Bld: 5 % (ref 4.8–5.6)

## 2020-03-10 LAB — IRON,TIBC AND FERRITIN PANEL
Ferritin: 251 ng/mL (ref 30–400)
Iron Saturation: 19 % (ref 15–55)
Iron: 55 ug/dL (ref 38–169)
Total Iron Binding Capacity: 292 ug/dL (ref 250–450)
UIBC: 237 ug/dL (ref 111–343)

## 2020-03-11 ENCOUNTER — Encounter: Payer: Self-pay | Admitting: Nurse Practitioner

## 2020-03-11 ENCOUNTER — Other Ambulatory Visit (HOSPITAL_COMMUNITY): Payer: Self-pay

## 2020-03-11 NOTE — Progress Notes (Signed)
Paramedicine Encounter    Patient ID: [name], male    DOB: 08-25-1961, 59 y.o.   MRN: [id number]   Patient Care Team: Gildardo Pounds, NP as PCP - General (Nurse Practitioner)  Patient Active Problem List   Diagnosis Date Noted  . 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, 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 ([place]) 12/30/2019  . Seizure ([place]) 10/21/2019  . ICD (implantable cardioverter-defibrillator) in place 08/29/2019  . Chronic systolic heart failure ([place]) 08/25/2019  . Orthostatic hypotension 12/21/2017  . Syncope 12/20/2017  . 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 Coronary artery disease 12/07/2017  . Aspiration pneumonia (HCC)     Current Outpatient Medications:  .  atorvastatin (LIPITOR) 80 MG tablet, Take 1 tablet (80 mg total) by mouth daily at 6 PM., Disp: 90 tablet, Rfl: 3 .  carvedilol (COREG) 3.125 MG tablet, Take 1 tablet (3.125 mg total) by mouth 2 (two) times daily with a meal., Disp: 60 tablet, Rfl: 0 .  folic acid (FOLVITE) 1 MG tablet, TAKE 1 TABLET (1 MG TOTAL) BY MOUTH DAILY., Disp: 30 tablet, Rfl: 0 .  levETIRAcetam (KEPPRA) 500 MG tablet, Take 1 tablet (500 mg total) by mouth 2 (two) times daily., Disp: 180 tablet, Rfl: 3 .  sacubitril-valsartan (ENTRESTO) 24-26 MG, Take 1 tablet by mouth 2 (two) times daily., Disp: 60 tablet, Rfl: 3 .  spironolactone (ALDACTONE) 25 MG tablet, Take 0.5 tablets (12.5 mg total) by mouth daily., Disp: 30 tablet, Rfl: 6 .  thiamine 100 MG tablet, TAKE 1 TABLET (100 MG TOTAL) BY MOUTH DAILY., Disp: 30 tablet, Rfl: 0 No Known Allergies   Social History   Socioeconomic History  . Marital status: Divorced    Spouse name: Not on file  . Number of children: Not on file  . Years of education: Not on file  . Highest education level: Not on file  Occupational History  . Not on file  Tobacco Use  . Smoking status: Never Smoker  . Smokeless tobacco: Never Used  Substance and Sexual Activity  . Alcohol  use: Yes    Alcohol/week: 0.0 - 42.0 standard drinks    Comment: 12/20/2017 "I quit drinking after I had the heart attack"  . Drug use: No  . Sexual activity: Not Currently  Other Topics Concern  . Not on file  Social History Narrative   Lives at home with his brother who is crippled. He takes care of his brother.    Right handed    Social Determinants of Health   Financial Resource Strain:   . Difficulty of Paying Living Expenses:   Food Insecurity:   . Worried About Charity fundraiser in the Last Year:   . Arboriculturist in the Last Year:   Transportation Needs:   . Film/video editor (Medical):   Marland Kitchen Lack of Transportation (Non-Medical):   Physical Activity:   . Days of Exercise per Week:   . Minutes of Exercise per Session:   Stress:   . Feeling of Stress :   Social Connections:   . Frequency of Communication with Friends and Family:   . Frequency of Social Gatherings with Friends and Family:   . Attends Religious Services:   . Active Member of Clubs or Organizations:   . Attends Archivist Meetings:   Marland Kitchen Marital Status:   Intimate Partner Violence:   . Fear of Current or Ex-Partner:   . Emotionally Abused:   Marland Kitchen Physically  Abused:   . Sexually Abused:     Physical Exam Vitals reviewed.  Constitutional:      Appearance: He is normal weight.  HENT:     Head: Normocephalic.     Nose: Nose normal.     Mouth/Throat:     Mouth: Mucous membranes are moist.  Eyes:     Pupils: Pupils are equal, round, and reactive to light.  Cardiovascular:     Rate and Rhythm: Normal rate and regular rhythm.     Pulses: Normal pulses.     Heart sounds: Normal heart sounds.  Pulmonary:     Effort: Pulmonary effort is normal.     Breath sounds: Normal breath sounds.  Abdominal:     Palpations: Abdomen is soft.  Musculoskeletal:        General: Normal range of motion.     Cervical back: Normal range of motion.  Skin:    General: Skin is warm and dry.     Capillary  Refill: Capillary refill takes less than 2 seconds.  Neurological:     Mental Status: He is alert. Mental status is at baseline.  Psychiatric:        Mood and Affect: Mood normal.     Arrived for home visit for [facility] who was alert and oriented walking without shortness of breath. [place] had no complaints stated he felt good. No edema noted. Lung sounds clear. No increased shortness of breath. Medications reviewed and confirmed pill box filled. Vitals obtained and are as noted. Lashaun agreed to visit next week. Home visit complete.     Future Appointments  Date Time Provider Department Center  03/23/2020 10:05 AM Graciella Freer, PA-C [facility]  04/06/2020  9:30 AM [facility] PA/NP [facility] None  05/31/2020  7:10 AM [facility] DEVICE REMOTES [facility]  11/29/2020  7:10 AM [facility] DEVICE REMOTES [facility]     ACTION: Home visit completed Next visit planned for one week

## 2020-03-12 NOTE — Telephone Encounter (Signed)
Spoke with patient and emphasized importance of taking his medications as directed with no missed doses. Dr Lubertha Basque advice to stop drinking was shared with patient and educated on importance of attending appointment on 03/23/20. Jenna from paramedicine program has arranged transportation for patient on [facility] and he is aware.

## 2020-03-18 ENCOUNTER — Other Ambulatory Visit (HOSPITAL_COMMUNITY): Payer: Self-pay | Admitting: *Deleted

## 2020-03-18 ENCOUNTER — Other Ambulatory Visit (HOSPITAL_COMMUNITY): Payer: Self-pay

## 2020-03-18 MED ORDER — FOLIC ACID 1 MG PO TABS: 1.0000 mg | ORAL_TABLET | Freq: Every day | ORAL | 0 refills | Status: DC

## 2020-03-18 MED ORDER — THIAMINE HCL 100 MG PO TABS: 100.0000 mg | ORAL_TABLET | Freq: Every day | ORAL | 0 refills | Status: DC

## 2020-03-18 MED FILL — FOLIC ACID 1 MG TABS: 1 | 30 days supply | Qty: 30 | Fill #0

## 2020-03-18 NOTE — Progress Notes (Signed)
Paramedicine Encounter    Patient ID: [name], male    DOB: 10/19/1961, 59 y.o.   MRN: [id number]   Patient Care Team: Gildardo Pounds, NP as PCP - General (Nurse Practitioner)  Patient Active Problem List   Diagnosis Date Noted  . 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 Seizure ([place]) 10/21/2019  . ICD (implantable cardioverter-defibrillator) in place 08/29/2019  . Chronic systolic heart failure ([place]) 08/25/2019  . Orthostatic hypotension 12/21/2017  . Syncope 12/20/2017  . 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 Coronary artery disease 12/07/2017  . Aspiration pneumonia (HCC)     Current Outpatient Medications:  .  atorvastatin (LIPITOR) 80 MG tablet, Take 1 tablet (80 mg total) by mouth daily at 6 PM., Disp: 90 tablet, Rfl: 3 .  carvedilol (COREG) 3.125 MG tablet, Take 1 tablet (3.125 mg total) by mouth 2 (two) times daily with a meal., Disp: 60 tablet, Rfl: 0 .  levETIRAcetam (KEPPRA) 500 MG tablet, Take 1 tablet (500 mg total) by mouth 2 (two) times daily., Disp: 180 tablet, Rfl: 3 .  sacubitril-valsartan (ENTRESTO) 24-26 MG, Take 1 tablet by mouth 2 (two) times daily., Disp: 60 tablet, Rfl: 3 .  spironolactone (ALDACTONE) 25 MG tablet, Take 0.5 tablets (12.5 mg total) by mouth daily., Disp: 30 tablet, Rfl: 6 .  folic acid (FOLVITE) 1 MG tablet, TAKE 1 TABLET (1 MG TOTAL) BY MOUTH DAILY. (Patient not taking: Reported on 03/18/2020), Disp: 30 tablet, Rfl: 0 .  thiamine 100 MG tablet, TAKE 1 TABLET (100 MG TOTAL) BY MOUTH DAILY. (Patient not taking: Reported on 03/18/2020), Disp: 30 tablet, Rfl: 0 No Known Allergies   Social History   Socioeconomic History  . Marital status: Divorced    Spouse name: Not on file  . Number of children: Not on file  . Years of education: Not on file  . Highest education level: Not on file  Occupational History  . Not on file  Tobacco Use  . Smoking status: Never  Smoker  . Smokeless tobacco: Never Used  Substance and Sexual Activity  . Alcohol use: Yes    Alcohol/week: 0.0 - 42.0 standard drinks    Comment: 12/20/2017 "I quit drinking after I had the heart attack"  . Drug use: No  . Sexual activity: Not Currently  Other Topics Concern  . Not on file  Social History Narrative   Lives at home with his brother who is crippled. He takes care of his brother.    Right handed    Social Determinants of Health   Financial Resource Strain:   . Difficulty of Paying Living Expenses:   Food Insecurity:   . Worried About Charity fundraiser in the Last Year:   . Arboriculturist in the Last Year:   Transportation Needs:   . Film/video editor (Medical):   Marland Kitchen Lack of Transportation (Non-Medical):   Physical Activity:   . Days of Exercise per Week:   . Minutes of Exercise per Session:   Stress:   . Feeling of Stress :   Social Connections:   . Frequency of Communication with Friends and Family:   . Frequency of Social Gatherings with Friends and Family:   . Attends Religious Services:   . Active Member of Clubs or Organizations:   . Attends Archivist Meetings:   Marland Kitchen Marital Status:   Intimate Partner Violence:   . Fear of  Current or Ex-Partner:   . Emotionally Abused:   Marland Kitchen Physically Abused:   . Sexually Abused:     Physical Exam Vitals reviewed.  Constitutional:      Appearance: He is normal weight.  HENT:     Head: Normocephalic.     Nose: Nose normal.     Mouth/Throat:     Mouth: Mucous membranes are moist.     Pharynx: Oropharynx is clear.  Eyes:     Pupils: Pupils are equal, round, and reactive to light.  Cardiovascular:     Rate and Rhythm: Normal rate and regular rhythm.     Pulses: Normal pulses.     Heart sounds: Normal heart sounds.  Pulmonary:     Effort: Pulmonary effort is normal.     Breath sounds: Normal breath sounds.  Abdominal:     General: Abdomen is flat.     Palpations: Abdomen is soft.   Musculoskeletal:        General: Normal range of motion.     Cervical back: Normal range of motion.     Right lower leg: Edema present.     Left lower leg: Edema present.  Skin:    Capillary Refill: Capillary refill takes less than 2 seconds.  Neurological:     Mental Status: He is alert. Mental status is at baseline.  Psychiatric:        Mood and Affect: Mood normal.    Arrived for home visit for [name] who was alert and oriented seated in a chair on the porch with no complaints. [name] noted to have some swelling in his lower legs, he states he has been walking a lot lately and drinking more than he's supposed to. I encouraged him to rest and keep his legs elevated as well as keeping his drinking at a minimum. He verbalized understanding. Vitals were obtained. Medications were confirmed. Patient missed 5 evening doses this week of his medicines. I educated him and his brother who he lives with the importance of taking his daily medications and what times he is supposed to take same. He verbalized understanding. Pill box filled accordingly. Patient weight 146lbs today. No weight gain, lung sounds clear. [name]'s brother stated he has fallen multiple times due to drinking alcohol. I continued to express the importance of decreasing his alcohol usage to decrease falls and other health complications. [name] agreed. Home visit complete. [name] agreed to visit in one week.   Refills- Folic Acid Thiamine     Weight today- 146lbs      Future Appointments  Date Time Provider Department Center  03/23/2020 10:05 AM Graciella Freer, PA-C [facility]  04/06/2020  9:30 AM [facility] PA/NP [facility] None  05/31/2020  7:10 AM [facility] DEVICE REMOTES [facility]  11/29/2020  7:10 AM [facility] DEVICE REMOTES [facility]     ACTION: Home visit completed Next visit planned for one week

## 2020-03-22 ENCOUNTER — Telehealth (HOSPITAL_COMMUNITY): Payer: Self-pay | Admitting: Licensed Clinical Social Worker

## 2020-03-22 NOTE — Progress Notes (Signed)
Electrophysiology Office Note Date: 03/23/2020  ID:  [name], [name] 10-08-1961, MRN [id number]  PCP: Claiborne Rigg, NP Primary Cardiologist: No primary care provider on file. Electrophysiologist: Lewayne Bunting, MD   CC: Routine ICD follow-up  [name] is a 59 y.o. male seen today for Dr. Ladona [name].  They present today for overdue 91 day ICD electrophysiology followup.  Since last being seen in our clinic, the patient reports doing OK. He has a new diagnosis of seizures. He continues to drink copious amounts of alcohol daily; including so far today. He denies chest pain. He has episodes where he feels lightheaded and falls. He is not sure how related it is to his alcohol consumption. He is not a great historian. No ICD shocks.  Device History:  Magazine features editor ICD implanted 08/2019 for cardiac arrest History of appropriate therapy: No History of AAD therapy: Yes Previously on amiodarone   Past Medical History:  Diagnosis Date  . AICD (automatic cardioverter/defibrillator) present   . Arthritis    "hands; maybe my toes" (12/20/2017)  . CAD (coronary artery disease)   . Cardiac arrest (HCC) 11/29/2017   hx VF arrest/notes 12/20/2017  . Coronary artery disease   . GERD (gastroesophageal reflux disease)   . H/O ETOH abuse    /notes 12/20/2017  . High cholesterol   . Hypertension   . Ischemic cardiomyopathy    a. 08/2019 s/p BSX D232 single lead AICD.  Marland Kitchen Seizure (HCC) 10/21/2019  . Seizures (HCC) ~ 2016; ~ 2017   "I've had a couple; I was at work" (12/20/2017)  . Syncope and collapse    "last few days" (12/20/2017)   Past Surgical History:  Procedure Laterality Date  . CARDIAC CATHETERIZATION    . CORONARY ARTERY BYPASS GRAFT N/A 12/07/2017   Procedure: CORONARY ARTERY BYPASS GRAFTING (CABG) times four using left internal mammary artery and right saphenous vein using endoscope for harvest.;  Surgeon: [name], MD;  Location: [facility] OR;   Service: Open Heart Surgery;  Laterality: N/A;  . IABP INSERTION N/A 11/29/2017   Procedure: IABP Insertion;  Surgeon: [name], MD;  [facility] IN[facility]ASIVE CV LAB;  Service: Cardiovascular;  Laterality: N/A;  . IABP INSERTION N/A 12/03/2017   Procedure: IABP INSERTION;  Surgeon: [name], MD;  [facility];  Service: Cardiovascular;  Laterality: N/A;  . ICD IMPLANT N/A 08/29/2019   Procedure: ICD IMPLANT;  Surgeon: Marinus Maw, MD;  Location[facility] INVASIVE CV LAB;  Service: Cardiovascular;  Laterality: N/A;  . LEFT HEART CATH AND CORONARY ANGIOGRAPHY N/A 11/29/2017   Procedure: LEFT HEART CATH AND CORONARY ANGIOGRAPHY;  Surgeon: [name], MD;  [facility] LAB;  Service: Cardiovascular;  Laterality: N/A;  . RIGHT HEART CATH N/A 11/29/2017   Procedure: RIGHT HEART CATH;  Surgeon: [name], MD;  [facility];  Service: Cardiovascular;  Laterality: N/A;  . TEE WITHOUT CARDIOVERSION N/A 12/07/2017   Procedure: TRANSESOPHAGEAL ECHOCARDIOGRAM (TEE);  Surgeon: Donata Clay, Theron Arista, MD;  Location: [facility] OR;  Service: Open Heart Surgery;  Laterality: N/A;  . TONSILLECTOMY AND ADENOIDECTOMY  ~ 1972    Current Outpatient Medications  Medication Sig Dispense Refill  . atorvastatin (LIPITOR) 80 MG tablet Take 1 tablet (80 mg total) by mouth daily at 6 PM. 90 tablet 3  . carvedilol (COREG) 3.125 MG tablet Take 1 tablet (3.125 mg total) by mouth 2 (two) times daily with a meal.  60 tablet 0  . folic acid (FOLVITE) 1 MG tablet Take 1 tablet (1 mg total) by mouth daily. 30 tablet 0  . levETIRAcetam (KEPPRA) 500 MG tablet Take 1 tablet (500 mg total) by mouth 2 (two) times daily. 180 tablet 3  . sacubitril-valsartan (ENTRESTO) 24-26 MG Take 1 tablet by mouth 2 (two) times daily. 60 tablet 3  . spironolactone (ALDACTONE) 25 MG tablet Take 0.5 tablets (12.5 mg total) by mouth daily. 30 tablet 6  . thiamine 100 MG tablet Take 1 tablet (100 mg  total) by mouth daily. 30 tablet 0   No current facility-administered medications for this visit.    Allergies:   Patient has no known allergies.   Social History: Social History   Socioeconomic History  . Marital status: Divorced    Spouse name: Not on file  . Number of children: Not on file  . Years of education: Not on file  . Highest education level: Not on file  Occupational History  . Not on file  Tobacco Use  . Smoking status: Never Smoker  . Smokeless tobacco: Never Used  Substance and Sexual Activity  . Alcohol use: Yes    Alcohol/week: 0.0 - 42.0 standard drinks    Comment: 12/20/2017 "I quit drinking after I had the heart attack"  . Drug use: No  . Sexual activity: Not Currently  Other Topics Concern  . Not on file  Social History Narrative   Lives at home with his brother who is crippled. He takes care of his brother.    Right handed    Social Determinants of Health   Financial Resource Strain:   . Difficulty of Paying Living Expenses:   Food Insecurity:   . Worried About Programme researcher, broadcasting/film/video in the Last Year:   . Barista in the Last Year:   Transportation Needs:   . Freight forwarder (Medical):   Marland Kitchen Lack of Transportation (Non-Medical):   Physical Activity:   . Days of Exercise per Week:   . Minutes of Exercise per Session:   Stress:   . Feeling of Stress :   Social Connections:   . Frequency of Communication with Friends and Family:   . Frequency of Social Gatherings with Friends and Family:   . Attends Religious Services:   . Active Member of Clubs or Organizations:   . Attends Banker Meetings:   Marland Kitchen Marital Status:   Intimate Partner Violence:   . Fear of Current or Ex-Partner:   . Emotionally Abused:   Marland Kitchen Physically Abused:   . Sexually Abused:     Family History: Family History  Problem Relation Age of Onset  . Heart attack Other   . Diabetes Neg Hx     Review of Systems: All other systems reviewed and are  otherwise negative except as noted above.   Physical Exam: Vitals:   03/23/20 1016  BP: 130/86  Pulse: (!) 114  SpO2: 96%  Weight: 145 lb (65.8 kg)  Height: 5\' 10"  (1.778 m)     GEN- The patient is well appearing, alert and oriented x 3 today.   HEENT: normocephalic, atraumatic; sclera clear, conjunctiva pink; hearing intact; oropharynx clear; neck supple, no JVP Lymph- no cervical lymphadenopathy Lungs- Clear to ausculation bilaterally, normal work of breathing.  No wheezes, rales, rhonchi Heart- Regular rate and rhythm, no murmurs, rubs or gallops, PMI not laterally displaced GI- soft, non-tender, non-distended, bowel sounds present, no hepatosplenomegaly Extremities- no clubbing,  cyanosis, or edema; DP/PT/radial pulses 2+ bilaterally MS- no significant deformity or atrophy Skin- warm and dry, no rash or lesion; ICD pocket well healed Psych- euthymic mood, full affect Neuro- strength and sensation are intact  ICD interrogation- reviewed in detail today,  See PACEART report  EKG:  EKG is not ordered today.  Recent Labs: 12/16/2019: B Natriuretic Peptide 120.0 01/08/2020: TSH 1.774 01/09/2020: Magnesium 1.9 01/10/2020: ALT 22 01/20/2020: BUN 5; Creatinine, Ser 0.97; Potassium 4.3; Sodium 139 03/09/2020: Hemoglobin 14.8; Platelets 186   Wt Readings from Last 3 Encounters:  03/23/20 145 lb (65.8 kg)  03/18/20 146 lb 3.2 oz (66.3 kg)  03/11/20 146 lb (66.2 kg)     Other studies Reviewed: Additional studies/ records that were reviewed today include: Previous office notes, Previous EP notes, Implant note, Previous Echo, Previous HF notes.   Assessment and Plan:  1.  Chronic systolic dysfunction s/p [employer] single chamber ICD  euvolemic today Stable on an appropriate medical regimen Normal ICD function See Pace Art report No changes today HR elevated. He misses his coreg. I encouraged compliance in lieu of increasing with reported falls.   2. Seizure Recent  diagnosis On Keppra  3. CAD/sp CABG Denies ischemic symptoms at this time.   4. H/o non compliance/poor health literacy HF team follows closely.   5. ETOH abuse Pt uses large amount of ETOH daily, including so far today.  Encouraged abstinence. He says he has no plans of cutting back or stopping at this time.   Current medicines are reviewed at length with the patient today.   The patient does not have concerns regarding his medicines.  The following changes were made today:  none  Labs/ tests ordered today include:  Orders Placed This Encounter  Procedures  . CUP PACEART INCLINIC DEVICE CHECK   Disposition:   Follow up with Dr. Lovena Le annually. Continue remotes q3 months.    Jacalyn Lefevre, PA-C  03/23/2020 11:39 AM  [facility] [address] [place] [address] [phone] (office) [phone] (fax)

## 2020-03-22 NOTE — Telephone Encounter (Signed)
CSW received call from pt stating that his water was just turned off due to nonpayment.  States he was unable to afford his water bill this month due to his high rent and owes $141.  CSW able to pay for bill through the patient care fund- water should be turned back on by 5pm today.  CSW will continue to follow and assist as needed  Burna Sis, LCSW Clinical Social Worker [facility] Desk#: [phone] Cell#: [phone]

## 2020-03-22 NOTE — Telephone Encounter (Signed)
CSW called pt to remind of appt tomorrow with [facility] and inform that transport is set up through [facility].  Pt reports he is aware of appt and has already heard from [facility] confirming pick up time for tomorrow.  Pt reports feeling ok- reports taking his medications consistently for the most part but hadn't taken yet this morning.  Pt reports no further needs at this time- CSW will continue to follow and assist as needed  Burna Sis, LCSW Clinical Social Worker [facility] Desk#: [phone] Cell#: [phone]

## 2020-03-23 ENCOUNTER — Telehealth: Payer: Self-pay | Admitting: Licensed Clinical Social Worker

## 2020-03-23 ENCOUNTER — Encounter: Payer: Self-pay | Admitting: Student

## 2020-03-23 ENCOUNTER — Ambulatory Visit (INDEPENDENT_AMBULATORY_CARE_PROVIDER_SITE_OTHER): Payer: Medicaid Other | Admitting: Student

## 2020-03-23 ENCOUNTER — Other Ambulatory Visit: Payer: Self-pay

## 2020-03-23 VITALS — BP 130/86 | HR 114 | Ht 70.0 in | Wt 145.0 lb

## 2020-03-23 DIAGNOSIS — Z9119 Patient's noncompliance with other medical treatment and regimen: Secondary | ICD-10-CM

## 2020-03-23 DIAGNOSIS — I2510 Atherosclerotic heart disease of native coronary artery without angina pectoris: Secondary | ICD-10-CM | POA: Diagnosis not present

## 2020-03-23 DIAGNOSIS — F1010 Alcohol abuse, uncomplicated: Secondary | ICD-10-CM | POA: Diagnosis not present

## 2020-03-23 DIAGNOSIS — Z951 Presence of aortocoronary bypass graft: Secondary | ICD-10-CM

## 2020-03-23 DIAGNOSIS — Z91199 Patient's noncompliance with other medical treatment and regimen due to unspecified reason: Secondary | ICD-10-CM

## 2020-03-23 DIAGNOSIS — R569 Unspecified convulsions: Secondary | ICD-10-CM

## 2020-03-23 DIAGNOSIS — I5022 Chronic systolic (congestive) heart failure: Secondary | ICD-10-CM

## 2020-03-23 LAB — CUP PACEART INCLINIC DEVICE CHECK
Date Time Interrogation Session: 20210330104659
HighPow Impedance: 47 Ohm
HighPow Impedance: 68 Ohm
Implantable Lead Implant Date: 20200904
Implantable Lead Location: 753860
Implantable Lead Model: 293
Implantable Lead Serial Number: 443717
Implantable Pulse Generator Implant Date: 20200904
Lead Channel Impedance Value: 548 Ohm
Lead Channel Pacing Threshold Amplitude: 0.7 V
Lead Channel Pacing Threshold Pulse Width: 0.4 ms
Lead Channel Sensing Intrinsic Amplitude: 17.1 mV
Lead Channel Setting Pacing Amplitude: 2.5 V
Lead Channel Setting Pacing Pulse Width: 0.4 ms
Lead Channel Setting Sensing Sensitivity: 0.5 mV
Pulse Gen Serial Number: 271403

## 2020-03-23 NOTE — Telephone Encounter (Signed)
CSW received call from pt stating that he was dropped off at the wrong location for his [facility] appt.  CSW able to locate pt and assisted him in getting to his appt.  CSW contacted [facility] transport and informed of correct location for pick up.  CSW also able to confirm that pt water was turned back on yesterday- no further concerns at this time  CSW will continue to follow and assist as needed  Burna Sis, LCSW Clinical Social Worker [facility] Desk#: [phone] Cell#: [phone]

## 2020-03-23 NOTE — Patient Instructions (Signed)
Medication Instructions:  none *If you need a refill on your cardiac medications before your next appointment, please call your pharmacy*   Lab Work: none If you have labs (blood work) drawn today and your tests are completely normal, you will receive your results only by: Marland Kitchen MyChart Message (if you have MyChart) OR . A paper copy in the mail If you have any lab test that is abnormal or we need to change your treatment, we will call you to review the results.   Testing/Procedures: none   Follow-Up: At [facility], you and your health needs are our priority.  As part of our continuing mission to provide you with exceptional heart care, we have created designated Provider Care Teams.  These Care Teams include your primary Cardiologist (physician) and Advanced Practice Providers (APPs -  Physician Assistants and Nurse Practitioners) who all work together to provide you with the care you need, when you need it.  We recommend signing up for the patient portal called "MyChart".  Sign up information is provided on this After Visit Summary.  MyChart is used to connect with patients for Virtual Visits (Telemedicine).  Patients are able to view lab/test results, encounter notes, upcoming appointments, etc.  Non-urgent messages can be sent to your provider as well.   To learn more about what you can do with MyChart, go to [email or web address].    Your next appointment:   1 year(s)  The format for your next appointment:   Either In Person or Virtual  Provider:   Dr Ladona Ridgel   Other Instructions Remote monitoring is used to monitor your  ICD from home. This monitoring reduces the number of office visits required to check your device to one time per year. It allows [place] to keep an eye on the functioning of your device to ensure it is working properly. You are scheduled for a device check from home on 05/31/20. You may send your transmission at any time that day. If you have a wireless device, the  transmission will be sent automatically. After your physician reviews your transmission, you will receive a postcard with your next transmission date.

## 2020-03-25 ENCOUNTER — Telehealth (HOSPITAL_COMMUNITY): Payer: Self-pay | Admitting: Licensed Clinical Social Worker

## 2020-03-25 ENCOUNTER — Other Ambulatory Visit (HOSPITAL_COMMUNITY): Payer: Self-pay

## 2020-03-25 MED FILL — SPIRONOLACTONE 25 MG TABS: 25 | 60 days supply | Qty: 30 | Fill #0
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
## 2020-03-25 NOTE — Progress Notes (Signed)
Obtained Entresto samples from [facility] Clinic and Spironolactone from [facility] for [facility]. I delivered same to [facility]. RN aware of delivery there.  I will follow up with patient once he is released from [place].

## 2020-03-28 DIAGNOSIS — S6992XA Unspecified injury of left wrist, hand and finger(s), initial encounter: Secondary | ICD-10-CM | POA: Diagnosis not present

## 2020-03-28 DIAGNOSIS — F10230 Alcohol dependence with withdrawal, uncomplicated: Secondary | ICD-10-CM | POA: Diagnosis not present

## 2020-03-28 DIAGNOSIS — I517 Cardiomegaly: Secondary | ICD-10-CM | POA: Diagnosis not present

## 2020-03-28 DIAGNOSIS — S63502A Unspecified sprain of left wrist, initial encounter: Secondary | ICD-10-CM | POA: Diagnosis not present

## 2020-03-28 DIAGNOSIS — S59912A Unspecified injury of left forearm, initial encounter: Secondary | ICD-10-CM | POA: Diagnosis not present

## 2020-03-28 DIAGNOSIS — R531 Weakness: Secondary | ICD-10-CM | POA: Diagnosis not present

## 2020-03-29 ENCOUNTER — Telehealth (HOSPITAL_COMMUNITY): Payer: Self-pay | Admitting: Licensed Clinical Social Worker

## 2020-03-29 NOTE — Telephone Encounter (Signed)
CSW received call from pt informing CSW that he is no longer in jail.  States that he was taken to [facility] for evaluation at some point but cannot detail why he was taken to the hospital or what happened there.  States that he feels ok now and that he has all his medications.  CSW informed pt Scientist, research (medical) that pt has been released so she can visit him this week.  CSW will continue to follow and assist as needed  Burna Sis, LCSW Clinical Social Worker [facility] Desk#: [phone] Cell#: [phone]

## 2020-03-30 ENCOUNTER — Other Ambulatory Visit (HOSPITAL_COMMUNITY): Payer: Self-pay

## 2020-03-30 NOTE — Progress Notes (Signed)
Paramedicine Encounter    Patient ID: [name], male    DOB: 1961-02-23, 59 y.o.   MRN: [id number]   Patient Care Team: Gildardo Pounds, NP as PCP - General (Nurse Practitioner) Evans Lance, MD as PCP - Electrophysiology (Cardiology)  Patient Active Problem List   Diagnosis Date Noted  . 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, 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 ([place]) 12/30/2019  . Seizure ([place]) 10/21/2019  . ICD (implantable cardioverter-defibrillator) in place 08/29/2019  . Chronic systolic heart failure ([place]) 08/25/2019  . Orthostatic hypotension 12/21/2017  . Syncope 12/20/2017  . 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 Coronary artery disease 12/07/2017  . Aspiration pneumonia (HCC)     Current Outpatient Medications:  .  atorvastatin (LIPITOR) 80 MG tablet, Take 1 tablet (80 mg total) by mouth daily at 6 PM., Disp: 90 tablet, Rfl: 3 .  carvedilol (COREG) 3.125 MG tablet, Take 1 tablet (3.125 mg total) by mouth 2 (two) times daily with a meal., Disp: 60 tablet, Rfl: 0 .  levETIRAcetam (KEPPRA) 500 MG tablet, Take 1 tablet (500 mg total) by mouth 2 (two) times daily., Disp: 180 tablet, Rfl: 3 .  sacubitril-valsartan (ENTRESTO) 24-26 MG, Take 1 tablet by mouth 2 (two) times daily., Disp: 60 tablet, Rfl: 3 .  spironolactone (ALDACTONE) 25 MG tablet, Take 0.5 tablets (12.5 mg total) by mouth daily., Disp: 30 tablet, Rfl: 6 .  folic acid (FOLVITE) 1 MG tablet, Take 1 tablet (1 mg total) by mouth daily. (Patient not taking: Reported on 03/30/2020), Disp: 30 tablet, Rfl: 0 .  thiamine 100 MG tablet, Take 1 tablet (100 mg total) by mouth daily. (Patient not taking: Reported on 03/30/2020), Disp: 30 tablet, Rfl: 0 No Known Allergies   Social History   Socioeconomic History  . Marital status: Divorced    Spouse name: Not on file  . Number of children: Not on file  . Years of education: Not on file  . Highest education level: Not on file  Occupational  History  . Not on file  Tobacco Use  . Smoking status: Never Smoker  . Smokeless tobacco: Never Used  Substance and Sexual Activity  . Alcohol use: Yes    Alcohol/week: 0.0 - 42.0 standard drinks    Comment: 12/20/2017 "I quit drinking after I had the heart attack"  . Drug use: No  . Sexual activity: Not Currently  Other Topics Concern  . Not on file  Social History Narrative   Lives at home with his brother who is crippled. He takes care of his brother.    Right handed    Social Determinants of Health   Financial Resource Strain:   . Difficulty of Paying Living Expenses:   Food Insecurity:   . Worried About Charity fundraiser in the Last Year:   . Arboriculturist in the Last Year:   Transportation Needs:   . Film/video editor (Medical):   Marland Kitchen Lack of Transportation (Non-Medical):   Physical Activity:   . Days of Exercise per Week:   . Minutes of Exercise per Session:   Stress:   . Feeling of Stress :   Social Connections:   . Frequency of Communication with Friends and Family:   . Frequency of Social Gatherings with Friends and Family:   . Attends Religious Services:   . Active Member of Clubs or Organizations:   . Attends Archivist Meetings:   Marland Kitchen Marital Status:  Intimate Partner Violence:   . Fear of Current or Ex-Partner:   . Emotionally Abused:   Marland Kitchen Physically Abused:   . Sexually Abused:     Physical Exam Constitutional:      Appearance: He is normal weight.  HENT:     Head: Normocephalic.     Nose: Nose normal.     Mouth/Throat:     Mouth: Mucous membranes are moist.  Eyes:     Pupils: Pupils are equal, round, and reactive to light.  Cardiovascular:     Rate and Rhythm: Normal rate and regular rhythm.     Pulses: Normal pulses.     Heart sounds: Normal heart sounds.  Pulmonary:     Effort: Pulmonary effort is normal.     Breath sounds: Normal breath sounds.  Abdominal:     General: Abdomen is flat.     Palpations: Abdomen is soft.   Musculoskeletal:        General: Normal range of motion.     Cervical back: Normal range of motion and neck supple.     Right lower leg: No edema.     Left lower leg: No edema.  Skin:    General: Skin is warm and dry.     Capillary Refill: Capillary refill takes less than 2 seconds.  Neurological:     Mental Status: He is alert. Mental status is at baseline.  Psychiatric:        Mood and Affect: Mood normal.     Arrived for home visit for [name] who was standing out side on the porch of his home on my arrival. [name] reports feeling fine and denied shortness of breath, chest pain, dizziness or N/V/D. [name] reports getting out of jail Sunday and stated he had all his medicines while he was in jail last week. Medications were reviewed and verified. Pill box filled for one week. Vitals were obtained and are as noted. [name] stated he has been walking a lot and has some foot pain but other than that feels fine. [name] agreed to visit in one week. Home visit complete.   Refills:  Spironolactone     Future Appointments  Date Time Provider Department Center  04/06/2020  9:30 AM [facility] PA/NP [facility] None  05/31/2020  7:10 AM [facility] DEVICE REMOTES [facility]  11/29/2020  7:10 AM [facility] DEVICE REMOTES [facility]     ACTION: Home visit completed Next visit planned for one week

## 2020-03-31 ENCOUNTER — Other Ambulatory Visit (HOSPITAL_COMMUNITY): Payer: Self-pay | Admitting: *Deleted

## 2020-03-31 MED ORDER — THIAMINE HCL 100 MG PO TABS: 100.0000 mg | ORAL_TABLET | Freq: Every day | ORAL | 6 refills | Status: DC

## 2020-03-31 MED ORDER — FOLIC ACID 1 MG PO TABS: 1.0000 mg | ORAL_TABLET | Freq: Every day | ORAL | 6 refills | Status: DC

## 2020-03-31 MED ORDER — SPIRONOLACTONE 25 MG PO TABS: 12.5000 mg | ORAL_TABLET | Freq: Every day | ORAL | 6 refills | Status: DC

## 2020-03-31 NOTE — Addendum Note (Signed)
Addended by: Noralee Space on: 03/31/2020 01:53 PM   Modules accepted: Orders

## 2020-04-05 ENCOUNTER — Telehealth (HOSPITAL_COMMUNITY): Payer: Self-pay

## 2020-04-05 NOTE — Telephone Encounter (Signed)
Attempted to reach [name], left message reminding him about his appointment tomorrow and for him to bring all medication and pill box with him tomorrow.

## 2020-04-06 ENCOUNTER — Encounter (HOSPITAL_COMMUNITY): Payer: Self-pay

## 2020-04-06 ENCOUNTER — Other Ambulatory Visit (HOSPITAL_COMMUNITY): Payer: Self-pay

## 2020-04-06 ENCOUNTER — Ambulatory Visit (HOSPITAL_COMMUNITY)
Admission: RE | Admit: 2020-04-06 | Discharge: 2020-04-06 | Disposition: A | Discharge status: Home / Self Care | Payer: Medicaid Other | Source: Ambulatory Visit | Attending: Cardiology | Admitting: Cardiology

## 2020-04-06 ENCOUNTER — Other Ambulatory Visit: Payer: Self-pay

## 2020-04-06 VITALS — BP 146/92 | HR 101 | Wt 137.8 lb

## 2020-04-06 DIAGNOSIS — Z951 Presence of aortocoronary bypass graft: Secondary | ICD-10-CM | POA: Insufficient documentation

## 2020-04-06 DIAGNOSIS — I252 Old myocardial infarction: Secondary | ICD-10-CM | POA: Insufficient documentation

## 2020-04-06 DIAGNOSIS — I255 Ischemic cardiomyopathy: Secondary | ICD-10-CM | POA: Diagnosis not present

## 2020-04-06 DIAGNOSIS — I110 Hypertensive heart disease with heart failure: Secondary | ICD-10-CM | POA: Diagnosis not present

## 2020-04-06 DIAGNOSIS — M1990 Unspecified osteoarthritis, unspecified site: Secondary | ICD-10-CM | POA: Insufficient documentation

## 2020-04-06 DIAGNOSIS — Z8674 Personal history of sudden cardiac arrest: Secondary | ICD-10-CM | POA: Insufficient documentation

## 2020-04-06 DIAGNOSIS — F1010 Alcohol abuse, uncomplicated: Secondary | ICD-10-CM | POA: Diagnosis not present

## 2020-04-06 DIAGNOSIS — I5022 Chronic systolic (congestive) heart failure: Secondary | ICD-10-CM | POA: Insufficient documentation

## 2020-04-06 DIAGNOSIS — Z79899 Other long term (current) drug therapy: Secondary | ICD-10-CM | POA: Diagnosis not present

## 2020-04-06 DIAGNOSIS — Z9119 Patient's noncompliance with other medical treatment and regimen: Secondary | ICD-10-CM | POA: Insufficient documentation

## 2020-04-06 DIAGNOSIS — I2510 Atherosclerotic heart disease of native coronary artery without angina pectoris: Secondary | ICD-10-CM | POA: Diagnosis not present

## 2020-04-06 DIAGNOSIS — R569 Unspecified convulsions: Secondary | ICD-10-CM | POA: Insufficient documentation

## 2020-04-06 DIAGNOSIS — Z95810 Presence of automatic (implantable) cardiac defibrillator: Secondary | ICD-10-CM | POA: Insufficient documentation

## 2020-04-06 LAB — COMPREHENSIVE METABOLIC PANEL
ALT: 42 U/L (ref 0–44)
AST: 54 U/L — ABNORMAL HIGH (ref 15–41)
Albumin: 3.9 g/dL (ref 3.5–5.0)
Alkaline Phosphatase: 75 U/L (ref 38–126)
Anion gap: 11 (ref 5–15)
BUN: 5 mg/dL — ABNORMAL LOW (ref 6–20)
CO2: 27 mmol/L (ref 22–32)
Calcium: 9.2 mg/dL (ref 8.9–10.3)
Chloride: 97 mmol/L — ABNORMAL LOW (ref 98–111)
Creatinine, Ser: 0.91 mg/dL (ref 0.61–1.24)
GFR calc Af Amer: >60 mL/min (ref >60)
GFR calc non Af Amer: >60 mL/min (ref >60)
Glucose, Bld: 90 mg/dL (ref 70–99)
Potassium: 3.8 mmol/L (ref 3.5–5.1)
Sodium: 135 mmol/L (ref 135–145)
Total Bilirubin: 1.1 mg/dL (ref 0.3–1.2)
Total Protein: 7.3 g/dL (ref 6.5–8.1)

## 2020-04-06 MED ORDER — IVABRADINE HCL 5 MG PO TABS: 2.5000 mg | ORAL_TABLET | Freq: Two times a day (BID) | ORAL | 3 refills | Status: DC

## 2020-04-06 MED ORDER — FUROSEMIDE 40 MG PO TABS: 40.0000 mg | ORAL_TABLET | Freq: Every day | ORAL | 6 refills | Status: DC | PRN

## 2020-04-06 MED ORDER — THIAMINE HCL 100 MG PO TABS: 100.0000 mg | ORAL_TABLET | Freq: Every day | ORAL | 6 refills | Status: DC

## 2020-04-06 MED ORDER — ENTRESTO 24-26 MG PO TABS: 1.0000 | ORAL_TABLET | Freq: Two times a day (BID) | ORAL | 6 refills | Status: DC

## 2020-04-06 MED ORDER — FOLIC ACID 1 MG PO TABS: 1.0000 mg | ORAL_TABLET | Freq: Every day | ORAL | 6 refills | Status: DC

## 2020-04-06 MED ORDER — SPIRONOLACTONE 25 MG PO TABS: 25.0000 mg | ORAL_TABLET | Freq: Every day | ORAL | 6 refills | Status: DC

## 2020-04-06 MED ORDER — EZETIMIBE 10 MG PO TABS: 10.0000 mg | ORAL_TABLET | Freq: Every day | ORAL | 6 refills | Status: DC

## 2020-04-06 MED FILL — CORLANOR 5 MG TABLET: 5 | 30 days supply | Qty: 30 | Fill #0

## 2020-04-06 MED FILL — FUROSEMIDE 40 MG TAB: 40 | 15 days supply | Qty: 15 | Fill #0

## 2020-04-06 MED FILL — EZETIMIBE 10 MG TABS: 10 | 30 days supply | Qty: 30 | Fill #0

## 2020-04-06 MED FILL — ENTRESTO 24 MG-26 MG TABLET: 24-26 | 30 days supply | Qty: 60 | Fill #0

## 2020-04-06 NOTE — Progress Notes (Signed)
Met with pt during clinic visit today to check in.  Pt doing ok but reports missing his court date for his assault charges- plans to follow up with law enforcement to clear this up.  Pt scheduled for pharmacy follow up and clinic follow up- [facility] transport set up for pharmacy appt- attempted to set up [facility] for follow up clinic appt but unable to until May when they open up those transport dates.  CSW will continue to follow and assist as needed  [name], [facility] Desk#: [phone] Cell#: [phone]

## 2020-04-06 NOTE — Progress Notes (Signed)
ADVANCED HF CLINIC NOTE  PCP: None  Primary Cardiologist: Dr Gala Romney   HPI: [name] a 59 y.o.malewith h/o ETOH, VF Arrest, CAD s/p CABG 12/07/17, and ICM with EF 25-30%.  Admitted 12/6 - 12/14/17 with V-Fib arrest while chopping wood. Received CPR in the field. R/LHC with severe 3vD as below and cardiogenic shock requiring IABP. Required milrinone with continued shock. Pt improved and IABP removed 12/10, but pt had recurrent VF arrest so IABP replaced. Stabilized and underwent CABG 12/07/17. Tolerated gradual wean of meds and extubation with no furthe events. HF medications titrated as tolerated.  Admitted 12/27 ->12/28 with syncope and AKI thought to be 2/2 volume depletion on lasix, spiro, and Entresto. CT negative for ICH. Improved rapidly with holding meds and gentle IVF. Entresto stopped. Spiro cut back and switched to low dose losartan.  He had repeat echo 04/2019 that showed persistently reduced LVEF at 25%.    Last clinic visit w/ Dr. Gala Romney on 7/22, losartan was discontinued and pt placed back on low dose Entresto (has been tolerating well w/o recurrent syncope/ near syncope). Given EF < 35% despite medical therapy, he was referred to EP and seen by Dr. Ladona Ridgel who recommended ICD implantation.    He was admitted 08/29/19 for implantation of a [employer] single chamber ICD.   He had routine clinic f/u on 10/23/19 and while in exam room had a witnessed seizure with loss of pulse. CPR started with quick ROSC. He did not require shock. Taken to the ED and had another seizure. Neurology consulted. Started on Keppra. HS Trop 10>12.  ICD interrogated. No arrhythmias noted.  From HF perspective he had mild volume overload and had a dose of IV lasix and transitioned back to 40 mg PO daily. His previous HF meds were restarted w/ addition of spironolactone.  He had no recurrent seizures after initiation of Keprra. Neurology recommended continuation of Keppra 500  mg twice a day and he was instructed not to drive.   Had return clinic f/u 11/04/19 and reported full compliance w/ Keppra.  Admitted to [name] on 12/09/19 with recurrent seizures in setting of noncompliance with his Keppra.   Admitted 01/08/20 after syncopal episode. Had AKI so HF meds stopped. Given IV fluids and started back on coreg, spiro, and lasix. [name] was stopped.  Had return f/u 2/21 and AKI had resolved. Entresto resumed.    He presents to clinic today for f/u. Here w/ paramedicine. Recently incarcerated for several days but reports he was given his HF meds while there. Volume status stable. No CP. No LEE, orthopnea/PND. He does not have his own transportation so walks to were he needs to go. Chronically NYHA II-III. No resting dyspnea. He is tachycardic. HR low 100s. Per chart review, his baseline HR ranges from upper 80s-low 100s. EKG was obtained after a period of rest, showing NSR, 86 bpm.  No dizziness. Continues to drink ~ 8 beers a day. BP 146/92 prior to AM meds. SBPs run in low 100s at home, per paramedicine.   ROS: All systems negative except as listed in HPI, PMH and Problem List.  SH:  Social History   Socioeconomic History  . Marital status: Divorced    Spouse name: Not on file  . Number of children: Not on file  . Years of education: Not on file  . Highest education level: Not on file  Occupational History  . Not on file  Tobacco Use  . Smoking status: Never Smoker  .  Smokeless tobacco: Never Used  Substance and Sexual Activity  . Alcohol use: Yes    Alcohol/week: 0.0 - 42.0 standard drinks    Comment: 12/20/2017 "I quit drinking after I had the heart attack"  . Drug use: No  . Sexual activity: Not Currently  Other Topics Concern  . Not on file  Social History Narrative   Lives at home with his brother who is crippled. He takes care of his brother.    Right handed    Social Determinants of Health   Financial Resource Strain:   .  Difficulty of Paying Living Expenses:   Food Insecurity:   . Worried About Charity fundraiser in the Last Year:   . Arboriculturist in the Last Year:   Transportation Needs:   . Film/video editor (Medical):   Marland Kitchen Lack of Transportation (Non-Medical):   Physical Activity:   . Days of Exercise per Week:   . Minutes of Exercise per Session:   Stress:   . Feeling of Stress :   Social Connections:   . Frequency of Communication with Friends and Family:   . Frequency of Social Gatherings with Friends and Family:   . Attends Religious Services:   . Active Member of Clubs or Organizations:   . Attends Archivist Meetings:   Marland Kitchen Marital Status:   Intimate Partner Violence:   . Fear of Current or Ex-Partner:   . Emotionally Abused:   Marland Kitchen Physically Abused:   . Sexually Abused:     FH:  Family History  Problem Relation Age of Onset  . Heart attack Other   . Diabetes Neg Hx     Past Medical History:  Diagnosis Date  . AICD (automatic cardioverter/defibrillator) present   . Arthritis    "hands; maybe my toes" (12/20/2017)  . CAD (coronary artery disease)   . Cardiac arrest ([facility]) 11/29/2017   hx VF arrest/notes 12/20/2017  . Coronary artery disease   . GERD (gastroesophageal reflux disease)   . H/O ETOH abuse    /notes 12/20/2017  . High cholesterol   . Hypertension   . Ischemic cardiomyopathy    a. 08/2019 s/p BSX D232 single lead AICD.  Marland Kitchen Seizure ([place]) 10/21/2019  . Seizures ([place]) ~ 2016; ~ 2017   "I've had a couple; I was at work" (12/20/2017)  . Syncope and collapse    "last few days" (12/20/2017)    Current Outpatient Medications  Medication Sig Dispense Refill  . atorvastatin (LIPITOR) 80 MG tablet Take 1 tablet (80 mg total) by mouth daily at 6 PM. 90 tablet 3  . carvedilol (COREG) 3.125 MG tablet Take 1 tablet (3.125 mg total) by mouth 2 (two) times daily with a meal. 60 tablet 0  . folic acid (FOLVITE) 1 MG tablet Take 1 tablet (1 mg total) by mouth  daily. 30 tablet 6  . levETIRAcetam (KEPPRA) 500 MG tablet Take 1 tablet (500 mg total) by mouth 2 (two) times daily. 180 tablet 3  . sacubitril-valsartan (ENTRESTO) 24-26 MG Take 1 tablet by mouth 2 (two) times daily. 60 tablet 3  . spironolactone (ALDACTONE) 25 MG tablet Take 0.5 tablets (12.5 mg total) by mouth daily. 15 tablet 6  . thiamine 100 MG tablet Take 1 tablet (100 mg total) by mouth daily. 30 tablet 6   No current facility-administered medications for this encounter.    Vitals:   04/06/20 0912  BP: (!) 146/92  Pulse: (!) 101  SpO2: 98%  Weight: 62.5 kg (137 lb 12.8 oz)   Wt Readings from Last 3 Encounters:  04/06/20 62.5 kg (137 lb 12.8 oz)  03/30/20 70.3 kg (155 lb)  03/23/20 65.8 kg (145 lb)   PHYSICAL EXAM: General:  Thin, disheveled appearing WM. No respiratory difficulty HEENT: normal Neck: supple. no JVD. Carotids 2+ bilat; no bruits. No lymphadenopathy or thyromegaly appreciated. Cor: PMI nondisplaced. Regular rhythm, mildly tachycardic. No rubs, gallops or murmurs. Lungs: clear Abdomen: soft, nontender, nondistended. No hepatosplenomegaly. No bruits or masses. Good bowel sounds. Extremities: no cyanosis, clubbing, rash, edema Neuro: alert & oriented x 3, cranial nerves grossly intact. moves all 4 extremities w/o difficulty. Affect pleasant.  ASSESSMENT & PLAN:  1. Seizure: recent diagnosis 09/2019.  - On Keppra 500 mg bid and reports full compliance. Denies any recent seizures  - Followed by Neurology.   2. Chronic systolic CHF with ICM - TTE 11/30/17 25-30% RV normal. Echo 03/2018: EF40-45%RV normal.  - ECHO 5/20 EF ~ 25%. Has ICD.  - ECHO 12/2019 EF 30-35% RV norma; - NYHA II- III. Volume status stable.  - Continue PRN lasix 20 mg  - Increase Spironolactone to 25 mg daily  -Continue Coreg 3.125 mg twice a day.  - Continue Entresto 24-26 mg twice a day. - SBPs in the low 100s at home, per paramedicine report ( BP 146/92 today prior to AM meds). No  room for Entresto/Coreg increase.   - Add corlanor for HR, 2.5 mg bid. Target HR <70 - Check BMP today  - f/u w/ pharmD in 2 weeks  - Not a candidate for advanced therapies due to ongoing alcohol abuse.  3. CAD s/p CABG 12/07/2017 - No chest pain.  -ContinueASA  blockerandatorvastatin.  4. 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 ETOH abuse  - still drinking ~ 8 beers daily.  - Discussed alcohol cessation. He has no plans to quit.   6. H/O Noncompliance/ Poor Health Literacy  - Followed by HF Paramedicine.     F/u w/ pharmD in 2 weeks and again w/ APP in 6 weeks.   Jose Wissner PA-C  9:33 AM

## 2020-04-06 NOTE — Progress Notes (Signed)
Met Imir in clinic today for clinic visit with PA. Mordche reports not feeling well. I noted in his pill box he only took two AM doses of medications and one PM dose of medications. I educated Nycere on the importance of taking his medications and that in order for him to feel better he has to eat and take his meds. He verbalized understanding. Brittany PA saw Ismail today and prescribed two new medications. Solace was made aware once those medications were delivered to reach out to me for me to place I his pill box. He verbalized agreement. I filled pill box with current medications. Lasix changed to be as needed by Brittany PA. Also, Spironolactone changed to 25mg daily. Valgene will have two week follow up with Lauren Kemp and 6-8 week follow up with PA in clinic. I will follow up with patient once medications are delivered.    Clinic visit complete.  

## 2020-04-06 NOTE — Patient Instructions (Addendum)
Increase Spironolactone to 25 mg daily  Start Corlanor 2.5 mg (1/2 tab) Twice daily   Start Zetia 10 mg daily  Take Furosemide ONLY AS NEEDED  Refills for Folic acid, Thiamine, Entresto, Spironolactone, Corlanor and Zetia have all been sent to [facility]  Please follow up with our heart failure pharmacist in 2-3 weeks  Your physician recommends that you schedule a follow-up appointment in: 6-8 weeks  If you have any questions or concerns before your next appointment please send [place] a message through [place] or call our office at [phone].  At the [facility], you and your health needs are our priority. As part of our continuing mission to provide you with exceptional heart care, we have created designated Provider Care Teams. These Care Teams include your primary Cardiologist (physician) and Advanced Practice Providers (APPs- Physician Assistants and Nurse Practitioners) who all work together to provide you with the care you need, when you need it.   You may see any of the following providers on your designated Care Team at your next follow up: Marland Kitchen Dr Arvilla Meres . Dr Marca Ancona . Tonye Becket, NP . Robbie Lis, PA . Karle Plumber, PharmD   Please be sure to bring in all your medications bottles to every appointment.
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
## 2020-04-13 ENCOUNTER — Telehealth (HOSPITAL_COMMUNITY): Payer: Self-pay | Admitting: Licensed Clinical Social Worker

## 2020-04-13 ENCOUNTER — Other Ambulatory Visit (HOSPITAL_COMMUNITY): Payer: Self-pay

## 2020-04-13 MED FILL — SPIRONOLACTONE 25 MG TABS: 25 | 30 days supply | Qty: 30 | Fill #0

## 2020-04-13 NOTE — Progress Notes (Signed)
Paramedicine Encounter    Patient ID: [name], male    DOB: Jul 18, 1961, 59 y.o.   MRN: [id number]   Patient Care Team: Claiborne Rigg, NP as PCP - General (Nurse Practitioner) Marinus Maw, MD as PCP - Electrophysiology (Cardiology)  Patient Active Problem List   Diagnosis Date Noted  . 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 Seizure (HCC) 10/21/2019  . ICD (implantable cardioverter-defibrillator) in place 08/29/2019  . Chronic systolic heart failure (HCC) 08/25/2019  . Orthostatic hypotension 12/21/2017  . Syncope 12/20/2017  . 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 Coronary artery disease 12/07/2017  . Aspiration pneumonia (HCC)     Current Outpatient Medications:  .  atorvastatin (LIPITOR) 80 MG tablet, Take 1 tablet (80 mg total) by mouth daily at 6 PM., Disp: 90 tablet, Rfl: 3 .  carvedilol (COREG) 3.125 MG tablet, Take 1 tablet (3.125 mg total) by mouth 2 (two) times daily with a meal., Disp: 60 tablet, Rfl: 0 .  furosemide (LASIX) 40 MG tablet, Take 1 tablet (40 mg total) by mouth daily as needed for fluid., Disp: 15 tablet, Rfl: 6 .  levETIRAcetam (KEPPRA) 500 MG tablet, Take 1 tablet (500 mg total) by mouth 2 (two) times daily., Disp: 180 tablet, Rfl: 3 .  sacubitril-valsartan (ENTRESTO) 24-26 MG, Take 1 tablet by mouth 2 (two) times daily., Disp: 60 tablet, Rfl: 6 .  spironolactone (ALDACTONE) 25 MG tablet, Take 1 tablet (25 mg total) by mouth daily., Disp: 30 tablet, Rfl: 6 .  ezetimibe (ZETIA) 10 MG tablet, Take 1 tablet (10 mg total) by mouth daily. (Patient not taking: Reported on 04/13/2020), Disp: 30 tablet, Rfl: 6 .  folic acid (FOLVITE) 1 MG tablet, Take 1 tablet (1 mg total) by mouth daily. (Patient not taking: Reported on 04/13/2020), Disp: 30 tablet, Rfl: 6 .  ivabradine (CORLANOR) 5 MG TABS tablet, Take 0.5 tablets (2.5 mg total) by mouth 2 (two) times daily with a meal. (Patient  not taking: Reported on 04/13/2020), Disp: 30 tablet, Rfl: 3 .  thiamine 100 MG tablet, Take 1 tablet (100 mg total) by mouth daily. (Patient not taking: Reported on 04/13/2020), Disp: 30 tablet, Rfl: 6 No Known Allergies   Social History   Socioeconomic History  . Marital status: Divorced    Spouse name: Not on file  . Number of children: Not on file  . Years of education: Not on file  . Highest education level: Not on file  Occupational History  . Not on file  Tobacco Use  . Smoking status: Never Smoker  . Smokeless tobacco: Never Used  Substance and Sexual Activity  . Alcohol use: Yes    Alcohol/week: 0.0 - 42.0 standard drinks    Comment: 12/20/2017 "I quit drinking after I had the heart attack"  . Drug use: No  . Sexual activity: Not Currently  Other Topics Concern  . Not on file  Social History Narrative   Lives at home with his brother who is crippled. He takes care of his brother.    Right handed    Social Determinants of Health   Financial Resource Strain:   . Difficulty of Paying Living Expenses:   Food Insecurity:   . Worried About Programme researcher, broadcasting/film/video in the Last Year:   . Barista in the Last Year:   Transportation Needs:   . Freight forwarder (Medical):   Marland Kitchen Lack of  Transportation (Non-Medical):   Physical Activity:   . Days of Exercise per Week:   . Minutes of Exercise per Session:   Stress:   . Feeling of Stress :   Social Connections:   . Frequency of Communication with Friends and Family:   . Frequency of Social Gatherings with Friends and Family:   . Attends Religious Services:   . Active Member of Clubs or Organizations:   . Attends Archivist Meetings:   Marland Kitchen Marital Status:   Intimate Partner Violence:   . Fear of Current or Ex-Partner:   . Emotionally Abused:   Marland Kitchen Physically Abused:   . Sexually Abused:     Physical Exam Vitals reviewed.  HENT:     Head: Normocephalic.     Nose: Nose normal.  Eyes:     Pupils:  Pupils are equal, round, and reactive to light.  Cardiovascular:     Rate and Rhythm: Normal rate and regular rhythm.     Pulses: Normal pulses.  Pulmonary:     Effort: Pulmonary effort is normal.     Breath sounds: Normal breath sounds.  Abdominal:     General: Abdomen is flat.     Palpations: Abdomen is soft.  Musculoskeletal:        General: Normal range of motion.     Cervical back: Normal range of motion.     Right lower leg: No edema.     Left lower leg: No edema.  Skin:    General: Skin is warm and dry.     Capillary Refill: Capillary refill takes less than 2 seconds.  Neurological:     Mental Status: He is alert. Mental status is at baseline.  Psychiatric:        Mood and Affect: Mood normal.     Arrived for home visit for [name] who was alert and oriented ambulating around on his porch on my arrival. [name] reports he has been doing okay with no shortness of breath, chest pain or increased dizzy spells. He says, "everything is the same". Vitals were obtained and are as noted in report. Assessment revealed no increased edema or swelling. Medications were reviewed. [name] has not received new RX for Corlanor or Zetia, [facility] confirmed they will be delivering them this week. I will pick up Vit D and Folic Acid for patient before next visit. Home visit complete. Will see patient in one week.     Future Appointments  Date Time Provider [place]  04/21/2020 10:00 AM [facility] PHARMACY [facility] None  05/25/2020 10:30 AM [facility] PA/NP [facility] None  05/31/2020  7:10 AM [facility] DEVICE REMOTES [facility]  11/29/2020  7:10 AM [facility] DEVICE REMOTES [facility]     ACTION: Home visit completed Next visit planned for one week

## 2020-04-13 NOTE — Telephone Encounter (Signed)
CSW informed by [employer] that pt has not received his new medications that we prescribed during last visit.  CSW called [facility] who reports they did not ship out because pt does not have a payor source.  CSW requested they waive the copay as they have in the past for his medications covered by Medicaid.  Pharmacy unable to fill folic acid and thiamine through his insurance- paramedic informed and will plan to pick up for pt OTC to be reimbursed through Patient Care Fund  CSW will continue to follow and assist as needed  Burna Sis, LCSW Clinical Social Worker [facility] Desk#: [phone] Cell#: [phone]

## 2020-04-20 ENCOUNTER — Other Ambulatory Visit (HOSPITAL_COMMUNITY): Payer: Self-pay

## 2020-04-20 ENCOUNTER — Other Ambulatory Visit (HOSPITAL_COMMUNITY): Payer: Medicaid Other
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
## 2020-04-21 ENCOUNTER — Other Ambulatory Visit: Payer: Self-pay

## 2020-04-21 ENCOUNTER — Other Ambulatory Visit (HOSPITAL_COMMUNITY): Payer: Medicaid Other

## 2020-04-21 ENCOUNTER — Ambulatory Visit (HOSPITAL_COMMUNITY)
Admission: RE | Admit: 2020-04-21 | Discharge: 2020-04-21 | Disposition: A | Discharge status: Home / Self Care | Payer: Medicaid Other | Source: Ambulatory Visit | Attending: Cardiology | Admitting: Cardiology

## 2020-04-21 VITALS — BP 102/66 | HR 99 | Wt 141.2 lb

## 2020-04-21 DIAGNOSIS — Z9119 Patient's noncompliance with other medical treatment and regimen: Secondary | ICD-10-CM | POA: Insufficient documentation

## 2020-04-21 DIAGNOSIS — Z951 Presence of aortocoronary bypass graft: Secondary | ICD-10-CM | POA: Insufficient documentation

## 2020-04-21 DIAGNOSIS — F1010 Alcohol abuse, uncomplicated: Secondary | ICD-10-CM | POA: Diagnosis not present

## 2020-04-21 DIAGNOSIS — I5022 Chronic systolic (congestive) heart failure: Secondary | ICD-10-CM | POA: Diagnosis present

## 2020-04-21 DIAGNOSIS — I252 Old myocardial infarction: Secondary | ICD-10-CM | POA: Diagnosis not present

## 2020-04-21 DIAGNOSIS — Z95810 Presence of automatic (implantable) cardiac defibrillator: Secondary | ICD-10-CM | POA: Diagnosis not present

## 2020-04-21 DIAGNOSIS — I255 Ischemic cardiomyopathy: Secondary | ICD-10-CM | POA: Insufficient documentation

## 2020-04-21 DIAGNOSIS — I2510 Atherosclerotic heart disease of native coronary artery without angina pectoris: Secondary | ICD-10-CM | POA: Insufficient documentation

## 2020-04-21 DIAGNOSIS — Z79899 Other long term (current) drug therapy: Secondary | ICD-10-CM | POA: Insufficient documentation

## 2020-04-21 DIAGNOSIS — Z8674 Personal history of sudden cardiac arrest: Secondary | ICD-10-CM | POA: Insufficient documentation

## 2020-04-21 LAB — BASIC METABOLIC PANEL
Anion gap: 10 (ref 5–15)
BUN: 7 mg/dL (ref 6–20)
CO2: 23 mmol/L (ref 22–32)
Calcium: 9 mg/dL (ref 8.9–10.3)
Chloride: 102 mmol/L (ref 98–111)
Creatinine, Ser: 0.93 mg/dL (ref 0.61–1.24)
GFR calc Af Amer: >60 mL/min (ref >60)
GFR calc non Af Amer: >60 mL/min (ref >60)
Glucose, Bld: 109 mg/dL — ABNORMAL HIGH (ref 70–99)
Potassium: 3.9 mmol/L (ref 3.5–5.1)
Sodium: 135 mmol/L (ref 135–145)

## 2020-04-21 MED ORDER — IVABRADINE HCL 5 MG PO TABS: 5.0000 mg | ORAL_TABLET | Freq: Two times a day (BID) | ORAL | 6 refills | Status: DC

## 2020-04-21 MED FILL — CORLANOR 5 MG TABLET: 5 | 30 days supply | Qty: 60 | Fill #0

## 2020-04-21 NOTE — Patient Instructions (Signed)
It was a pleasure seeing you today!  MEDICATIONS: -We are changing your medications today -Increase Corlanor to 5 mg (1 tablet) twice daily. -Call if you have questions about your medications.  LABS: -We will call you if your labs need attention.  NEXT APPOINTMENT: Return to clinic in 1 month with NP Clinic.  In general, to take care of your heart failure: -Limit your fluid intake to 2 Liters (half-gallon) per day.   -Limit your salt intake to ideally 2-3 grams (2000-3000 mg) per day. -Weigh yourself daily and record, and bring that "weight diary" to your next appointment.  (Weight gain of 2-3 pounds in 1 day typically means fluid weight.) -The medications for your heart are to help your heart and help you live longer.   -Please contact us before stopping any of your heart medications.  Call the clinic at [phone] with questions or to reschedule future appointments.

## 2020-04-26 ENCOUNTER — Telehealth (HOSPITAL_COMMUNITY): Payer: Self-pay | Admitting: Licensed Clinical Social Worker

## 2020-04-26 NOTE — Telephone Encounter (Signed)
CSW called [employer] transportation and scheduled a ride for pt appt on 6/1  CSW will continue to follow and assist as needed  Burna Sis, LCSW Clinical Social Worker [facility] Desk#: [phone] Cell#: [phone]

## 2020-04-26 NOTE — Telephone Encounter (Addendum)
CSW received call from pt stating that he owes $440 in rent to his landlord which he is unable to afford at this time because the other people living with him have not been assisting with rent.  No eviction date at this time but states his landlord is coming today to get payment.  Pt stating he is kicking out his roommates that haven't been paying with the hopes he can find people who will be able to pay but still won't be able to cover this month.  CSW searching for [facility] resources to see what assistance is available:  [employer]:(336) [phone]- they emailed CSW the intake packet- CSW forwarded to pt [employer] who will take out to pt and help complete during home visit tomorrow  [facility] DSS- will only provide assistance for people with children  [employer] [phone]- helped pt complete phone intake- will need to set up in person appt after he gathers proof of rent payment for the past 3 months   CSW attempted to call pt landlord to discuss as well- Efraim Kaufmann [phone]- unable to reach or leave a VM  CSW will continue to follow and assist as needed   Burna Sis, LCSW Clinical Social Worker [facility] Desk#: [phone] Cell#: [phone]

## 2020-04-27 ENCOUNTER — Other Ambulatory Visit (HOSPITAL_COMMUNITY): Payer: Self-pay

## 2020-04-27 ENCOUNTER — Telehealth (HOSPITAL_COMMUNITY): Payer: Self-pay | Admitting: Licensed Clinical Social Worker

## 2020-04-27 NOTE — Telephone Encounter (Signed)
CSW continuing to help look into possible assistance programs to help pt with overdue rent.  Contacted the following agencies:  [phone]- pt would need to call starting at 7:30am on Friday and leave message to request assistance- first come first serve assistance available  [name]- [phone]- does NOT provide rental assistance- only utility assistance when available  [employer]- [phone]- left message for [place] to inquire about rental assistance  [employer] did assist pt in filling out paperwork for [employer] and is returning to clinic- will require landlord signature so CSW attempted to call landlord again to figure out getting her the paperwork- no answer and unable to leave a message  CSW continuing to follow and assist as needed  Burna Sis, LCSW Clinical Social Worker [facility] Desk#: [phone] Cell#: [phone]
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
## 2020-05-05 ENCOUNTER — Telehealth (HOSPITAL_COMMUNITY): Payer: Self-pay

## 2020-05-05 NOTE — Telephone Encounter (Signed)
Attempted to call Neiko Trivedi to make him aware of my absence this week and that I will return for home visit next Tuesday however his phone is turned off. I will make [name] aware of same.

## 2020-05-06 ENCOUNTER — Other Ambulatory Visit (HOSPITAL_COMMUNITY): Payer: Self-pay

## 2020-05-06 NOTE — Progress Notes (Signed)
Paramedicine Encounter    Patient ID: [name], male    DOB: [birth year], 59 y.o.   MRN: [id number]   Patient Care Team: Claiborne Rigg, NP as PCP - General (Nurse Practitioner) Marinus Maw, MD as PCP - Electrophysiology (Cardiology)  Patient Active Problem List   Diagnosis Date Noted  . 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 Seizure (HCC) 10/21/2019  . ICD (implantable cardioverter-defibrillator) in place 08/29/2019  . Chronic systolic heart failure (HCC) 08/25/2019  . Orthostatic hypotension 12/21/2017  . Syncope 12/20/2017  . 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 Coronary artery disease 12/07/2017  . Aspiration pneumonia (HCC)     Current Outpatient Medications:  .  atorvastatin (LIPITOR) 80 MG tablet, Take 1 tablet (80 mg total) by mouth daily at 6 PM., Disp: 90 tablet, Rfl: 3 .  carvedilol (COREG) 3.125 MG tablet, Take 1 tablet (3.125 mg total) by mouth 2 (two) times daily with a meal., Disp: 60 tablet, Rfl: 0 .  ezetimibe (ZETIA) 10 MG tablet, Take 1 tablet (10 mg total) by mouth daily., Disp: 30 tablet, Rfl: 6 .  folic acid (FOLVITE) 1 MG tablet, Take 1 tablet (1 mg total) by mouth daily., Disp: 30 tablet, Rfl: 6 .  furosemide (LASIX) 40 MG tablet, Take 1 tablet (40 mg total) by mouth daily as needed for fluid., Disp: 15 tablet, Rfl: 6 .  ivabradine (CORLANOR) 5 MG TABS tablet, Take 1 tablet (5 mg total) by mouth 2 (two) times daily with a meal., Disp: 60 tablet, Rfl: 6 .  levETIRAcetam (KEPPRA) 500 MG tablet, Take 1 tablet (500 mg total) by mouth 2 (two) times daily., Disp: 180 tablet, Rfl: 3 .  sacubitril-valsartan (ENTRESTO) 24-26 MG, Take 1 tablet by mouth 2 (two) times daily., Disp: 60 tablet, Rfl: 6 .  spironolactone (ALDACTONE) 25 MG tablet, Take 1 tablet (25 mg total) by mouth daily., Disp: 30 tablet, Rfl: 6 .  thiamine 100 MG tablet, Take 1 tablet (100 mg total) by mouth daily.,  Disp: 30 tablet, Rfl: 6 No Known Allergies   Social History   Socioeconomic History  . Marital status: Divorced    Spouse name: Not on file  . Number of children: Not on file  . Years of education: Not on file  . Highest education level: Not on file  Occupational History  . Not on file  Tobacco Use  . Smoking status: Never Smoker  . Smokeless tobacco: Never Used  Substance and Sexual Activity  . Alcohol use: Yes    Alcohol/week: 0.0 - 42.0 standard drinks    Comment: 12/20/2017 "I quit drinking after I had the heart attack"  . Drug use: No  . Sexual activity: Not Currently  Other Topics Concern  . Not on file  Social History Narrative   Lives at home with his brother who is crippled. He takes care of his brother.    Right handed    Social Determinants of Health   Financial Resource Strain:   . Difficulty of Paying Living Expenses:   Food Insecurity:   . Worried About Programme researcher, broadcasting/film/video in the Last Year:   . Barista in the Last Year:   Transportation Needs:   . Freight forwarder (Medical):   Marland Kitchen Lack of Transportation (Non-Medical):   Physical Activity:   . Days of Exercise per Week:   . Minutes of Exercise per Session:  Stress:   . Feeling of Stress :   Social Connections:   . Frequency of Communication with Friends and Family:   . Frequency of Social Gatherings with Friends and Family:   . Attends Religious Services:   . Active Member of Clubs or Organizations:   . Attends Archivist Meetings:   Marland Kitchen Marital Status:   Intimate Partner Violence:   . Fear of Current or Ex-Partner:   . Emotionally Abused:   Marland Kitchen Physically Abused:   . Sexually Abused:     Physical Exam   Arrived for home visit for [facility] who was ambulating outside with no shortness of breath, no complaints of dizziness, chest pain, trouble walking, sleeping or eating. [name] had no edema noted. Vitals obtained and as noted. [name] was compliant with medications this week.  Medications were reviewed and verified. Pill box filled accordingly. [name] reports he has yet to remove the extra roomates living with him to avoid eviction. I educated [name] the importance of telling the extra persons to leave the property to avoid eviction, he and his brother agreed they will work on this. [name] also stated he has a court date June 1 but wants me to confirm and check for him. I will work on same. [name] agreed to home visit on Tuesday.   Refills: Zetia Spironolactone    Future Appointments  Date Time Provider [place]  05/25/2020 10:30 AM [facility] PA/NP [facility] None  05/31/2020  7:10 AM [facility]ICE [facility]  11/29/2020  7:10 AM [facility]     ACTION: Home visit completed Next visit planned for one week

## 2020-05-11 ENCOUNTER — Other Ambulatory Visit (HOSPITAL_COMMUNITY): Payer: Self-pay
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
## 2020-05-12 ENCOUNTER — Telehealth (HOSPITAL_COMMUNITY): Payer: Self-pay | Admitting: Licensed Clinical Social Worker

## 2020-05-12 NOTE — Telephone Encounter (Addendum)
CSW informed by [employer] that pt stated he might have a court date on June 1st when he is supposed to come into the clinic.  CSW called the court house and confirmed he has a court date on June 1st at 9:30am.  CSW had clinic scheduler move pt appt to 6/22 and CSW called [facility] and had them move transport request to 6/22.  Pt also had outstanding water bill of $152- water due to be shut off on 5/25.  CSW able to pay for this bill through the Patient Care Fund- once CSW able to speak with pt will inform and let him know this is the last time we can pay for his utilities.  CSW attempting to inform pt of change but his phone was out of minutes.  Paramedic assisted in adding minutes yesterday but pt called on a friends phone and stated his phone still isn't working after restarting it- CSW attempted to call back on friends phone but not answer thus far- left a message.  Also called [employer] the local convenience store and left my number with them to give to Schubert when he comes in.  Will continue to follow and assist as needed  Burna Sis, LCSW Clinical Social Worker [facility] Desk#: [phone] Cell#: [phone]

## 2020-05-18 ENCOUNTER — Other Ambulatory Visit (HOSPITAL_COMMUNITY): Payer: Self-pay

## 2020-05-18 ENCOUNTER — Encounter (HOSPITAL_COMMUNITY): Payer: Self-pay | Admitting: Internal Medicine

## 2020-05-18 ENCOUNTER — Emergency Department (HOSPITAL_COMMUNITY): Payer: Medicaid Other

## 2020-05-18 ENCOUNTER — Inpatient Hospital Stay (HOSPITAL_COMMUNITY)
Admission: EM | Admit: 2020-05-18 | Discharge: 2020-05-25 | DRG: 570 | Disposition: A | Discharge status: Home / Self Care | Payer: Medicaid Other | Attending: Internal Medicine | Admitting: Internal Medicine

## 2020-05-18 DIAGNOSIS — M25532 Pain in left wrist: Secondary | ICD-10-CM | POA: Diagnosis present

## 2020-05-18 DIAGNOSIS — I10 Essential (primary) hypertension: Secondary | ICD-10-CM | POA: Insufficient documentation

## 2020-05-18 DIAGNOSIS — I5022 Chronic systolic (congestive) heart failure: Secondary | ICD-10-CM | POA: Diagnosis not present

## 2020-05-18 DIAGNOSIS — M79641 Pain in right hand: Secondary | ICD-10-CM | POA: Diagnosis not present

## 2020-05-18 DIAGNOSIS — Z8249 Family history of ischemic heart disease and other diseases of the circulatory system: Secondary | ICD-10-CM

## 2020-05-18 DIAGNOSIS — S61419A Laceration without foreign body of unspecified hand, initial encounter: Secondary | ICD-10-CM | POA: Diagnosis present

## 2020-05-18 DIAGNOSIS — Z03818 Encounter for observation for suspected exposure to other biological agents ruled out: Secondary | ICD-10-CM | POA: Diagnosis not present

## 2020-05-18 DIAGNOSIS — I110 Hypertensive heart disease with heart failure: Secondary | ICD-10-CM | POA: Diagnosis present

## 2020-05-18 DIAGNOSIS — M19041 Primary osteoarthritis, right hand: Secondary | ICD-10-CM | POA: Diagnosis present

## 2020-05-18 DIAGNOSIS — F1010 Alcohol abuse, uncomplicated: Secondary | ICD-10-CM | POA: Diagnosis not present

## 2020-05-18 DIAGNOSIS — E872 Acidosis, unspecified: Secondary | ICD-10-CM | POA: Diagnosis present

## 2020-05-18 DIAGNOSIS — Z79899 Other long term (current) drug therapy: Secondary | ICD-10-CM

## 2020-05-18 DIAGNOSIS — I4901 Ventricular fibrillation: Secondary | ICD-10-CM | POA: Diagnosis present

## 2020-05-18 DIAGNOSIS — Z951 Presence of aortocoronary bypass graft: Secondary | ICD-10-CM

## 2020-05-18 DIAGNOSIS — W19XXXA Unspecified fall, initial encounter: Secondary | ICD-10-CM | POA: Diagnosis present

## 2020-05-18 DIAGNOSIS — Z95810 Presence of automatic (implantable) cardiac defibrillator: Secondary | ICD-10-CM

## 2020-05-18 DIAGNOSIS — F10131 Alcohol abuse with withdrawal delirium: Secondary | ICD-10-CM | POA: Diagnosis present

## 2020-05-18 DIAGNOSIS — I255 Ischemic cardiomyopathy: Secondary | ICD-10-CM | POA: Diagnosis present

## 2020-05-18 DIAGNOSIS — Z20822 Contact with and (suspected) exposure to covid-19: Secondary | ICD-10-CM | POA: Diagnosis present

## 2020-05-18 DIAGNOSIS — L03114 Cellulitis of left upper limb: Principal | ICD-10-CM | POA: Diagnosis present

## 2020-05-18 DIAGNOSIS — G40209 Localization-related (focal) (partial) symptomatic epilepsy and epileptic syndromes with complex partial seizures, not intractable, without status epilepticus: Secondary | ICD-10-CM | POA: Diagnosis present

## 2020-05-18 DIAGNOSIS — E782 Mixed hyperlipidemia: Secondary | ICD-10-CM | POA: Diagnosis present

## 2020-05-18 DIAGNOSIS — R609 Edema, unspecified: Secondary | ICD-10-CM | POA: Diagnosis not present

## 2020-05-18 DIAGNOSIS — R069 Unspecified abnormalities of breathing: Secondary | ICD-10-CM | POA: Diagnosis not present

## 2020-05-18 DIAGNOSIS — I5042 Chronic combined systolic (congestive) and diastolic (congestive) heart failure: Secondary | ICD-10-CM | POA: Diagnosis present

## 2020-05-18 DIAGNOSIS — M19042 Primary osteoarthritis, left hand: Secondary | ICD-10-CM | POA: Diagnosis present

## 2020-05-18 DIAGNOSIS — G9341 Metabolic encephalopathy: Secondary | ICD-10-CM | POA: Diagnosis not present

## 2020-05-18 DIAGNOSIS — Z8674 Personal history of sudden cardiac arrest: Secondary | ICD-10-CM

## 2020-05-18 DIAGNOSIS — I2510 Atherosclerotic heart disease of native coronary artery without angina pectoris: Secondary | ICD-10-CM | POA: Diagnosis not present

## 2020-05-18 DIAGNOSIS — R52 Pain, unspecified: Secondary | ICD-10-CM | POA: Diagnosis not present

## 2020-05-18 DIAGNOSIS — K219 Gastro-esophageal reflux disease without esophagitis: Secondary | ICD-10-CM | POA: Diagnosis present

## 2020-05-18 DIAGNOSIS — L02414 Cutaneous abscess of left upper limb: Secondary | ICD-10-CM | POA: Diagnosis present

## 2020-05-18 DIAGNOSIS — G40409 Other generalized epilepsy and epileptic syndromes, not intractable, without status epilepticus: Secondary | ICD-10-CM | POA: Diagnosis present

## 2020-05-18 DIAGNOSIS — M7989 Other specified soft tissue disorders: Secondary | ICD-10-CM | POA: Diagnosis not present

## 2020-05-18 HISTORY — DX: Acidosis, unspecified: E87.20

## 2020-05-18 HISTORY — DX: Essential (primary) hypertension: I10

## 2020-05-18 HISTORY — DX: Mixed hyperlipidemia: E78.2

## 2020-05-18 HISTORY — DX: Cellulitis of left upper limb: L03.114

## 2020-05-18 HISTORY — DX: Alcohol abuse, uncomplicated: F10.10

## 2020-05-18 LAB — COMPREHENSIVE METABOLIC PANEL
ALT: 24 U/L (ref 0–44)
AST: 28 U/L (ref 15–41)
Albumin: 3.8 g/dL (ref 3.5–5.0)
Alkaline Phosphatase: 66 U/L (ref 38–126)
Anion gap: 10 (ref 5–15)
BUN: 7 mg/dL (ref 6–20)
CO2: 26 mmol/L (ref 22–32)
Calcium: 9.2 mg/dL (ref 8.9–10.3)
Chloride: 96 mmol/L — ABNORMAL LOW (ref 98–111)
Creatinine, Ser: 1.01 mg/dL (ref 0.61–1.24)
GFR calc Af Amer: >60 mL/min (ref >60)
GFR calc non Af Amer: >60 mL/min (ref >60)
Glucose, Bld: 124 mg/dL — ABNORMAL HIGH (ref 70–99)
Potassium: 5.1 mmol/L (ref 3.5–5.1)
Sodium: 132 mmol/L — ABNORMAL LOW (ref 135–145)
Total Bilirubin: 1.2 mg/dL (ref 0.3–1.2)
Total Protein: 7.6 g/dL (ref 6.5–8.1)

## 2020-05-18 LAB — CBC WITH DIFFERENTIAL/PLATELET
Abs Immature Granulocytes: 0.09 10*3/uL — ABNORMAL HIGH (ref 0.00–0.07)
Basophils Absolute: 0 10*3/uL (ref 0.0–0.1)
Basophils Relative: 0 %
Eosinophils Absolute: 0 10*3/uL (ref 0.0–0.5)
Eosinophils Relative: 0 %
HCT: 46 % (ref 39.0–52.0)
Hemoglobin: 15.6 g/dL (ref 13.0–17.0)
Immature Granulocytes: 1 %
Lymphocytes Relative: 5 %
Lymphs Abs: 0.6 10*3/uL — ABNORMAL LOW (ref 0.7–4.0)
MCH: 34 pg (ref 26.0–34.0)
MCHC: 33.9 g/dL (ref 30.0–36.0)
MCV: 100.2 fL — ABNORMAL HIGH (ref 80.0–100.0)
Monocytes Absolute: 1.4 10*3/uL — ABNORMAL HIGH (ref 0.1–1.0)
Monocytes Relative: 10 %
Neutro Abs: 11.5 10*3/uL — ABNORMAL HIGH (ref 1.7–7.7)
Neutrophils Relative %: 84 %
Platelets: 169 10*3/uL (ref 150–400)
RBC: 4.59 MIL/uL (ref 4.22–5.81)
RDW: 12.1 % (ref 11.5–15.5)
WBC: 13.7 10*3/uL — ABNORMAL HIGH (ref 4.0–10.5)
nRBC: 0 % (ref 0.0–0.2)

## 2020-05-18 LAB — HEMOGLOBIN A1C
Hgb A1c MFr Bld: 5.8 % — ABNORMAL HIGH (ref 4.8–5.6)
Mean Plasma Glucose: 119.76 mg/dL

## 2020-05-18 LAB — PROTIME-INR
INR: 1 (ref 0.8–1.2)
Prothrombin Time: 12.9 seconds (ref 11.4–15.2)

## 2020-05-18 LAB — LACTIC ACID, PLASMA
Lactic Acid, Venous: 2.2 mmol/L (ref 0.5–1.9)
Lactic Acid, Venous: 1.8 mmol/L (ref 0.5–1.9)

## 2020-05-18 LAB — SARS CORONAVIRUS 2 BY RT PCR (HOSPITAL ORDER, PERFORMED IN ~~LOC~~ HOSPITAL LAB): SARS Coronavirus 2: NEGATIVE

## 2020-05-18 MED ORDER — VANCOMYCIN HCL 750 MG/150ML IV SOLN
750.0000 mg | Freq: Two times a day (BID) | INTRAVENOUS | Status: DC
  Filled 2020-05-18: qty 150

## 2020-05-18 MED ORDER — THIAMINE HCL 100 MG PO TABS
100.0000 mg | ORAL_TABLET | Freq: Every day | ORAL | Status: DC
  Administered 2020-05-18 – 2020-05-25 (×7): 100 mg via ORAL
  Filled 2020-05-18 (×8): qty 1

## 2020-05-18 MED ORDER — OXYCODONE-ACETAMINOPHEN 5-325 MG PO TABS
1.0000 | ORAL_TABLET | ORAL | Status: DC | PRN
  Administered 2020-05-18: 1 via ORAL
  Filled 2020-05-18: qty 1

## 2020-05-18 MED ORDER — ACETAMINOPHEN 650 MG RE SUPP: 650.0000 mg | Freq: Four times a day (QID) | RECTAL | Status: DC | PRN

## 2020-05-18 MED ORDER — FOLIC ACID 1 MG PO TABS
1.0000 mg | ORAL_TABLET | Freq: Every day | ORAL | Status: DC
  Administered 2020-05-19 – 2020-05-25 (×6): 1 mg via ORAL
  Filled 2020-05-18 (×7): qty 1

## 2020-05-18 MED ORDER — CARVEDILOL 3.125 MG PO TABS
3.1250 mg | ORAL_TABLET | Freq: Two times a day (BID) | ORAL | Status: DC
  Administered 2020-05-19 – 2020-05-25 (×12): 3.125 mg via ORAL
  Filled 2020-05-18 (×13): qty 1

## 2020-05-18 MED ORDER — ACETAMINOPHEN 325 MG PO TABS: 650.0000 mg | ORAL_TABLET | Freq: Four times a day (QID) | ORAL | Status: DC | PRN

## 2020-05-18 MED ORDER — THIAMINE HCL 100 MG/ML IJ SOLN
100.0000 mg | Freq: Every day | INTRAMUSCULAR | Status: DC
  Filled 2020-05-18: qty 2

## 2020-05-18 MED ORDER — OXYCODONE-ACETAMINOPHEN 5-325 MG PO TABS
2.0000 | ORAL_TABLET | ORAL | Status: DC | PRN
  Administered 2020-05-19 – 2020-05-25 (×9): 2 via ORAL
  Filled 2020-05-18 (×9): qty 2

## 2020-05-18 MED ORDER — POLYETHYLENE GLYCOL 3350 17 G PO PACK: 17.0000 g | PACK | Freq: Every day | ORAL | Status: DC | PRN

## 2020-05-18 MED ORDER — ONDANSETRON HCL 4 MG PO TABS: 4.0000 mg | ORAL_TABLET | Freq: Four times a day (QID) | ORAL | Status: DC | PRN

## 2020-05-18 MED ORDER — ATORVASTATIN CALCIUM 80 MG PO TABS
80.0000 mg | ORAL_TABLET | Freq: Every day | ORAL | Status: DC
  Administered 2020-05-19 – 2020-05-24 (×6): 80 mg via ORAL
  Filled 2020-05-18 (×6): qty 1

## 2020-05-18 MED ORDER — ASPIRIN EC 81 MG PO TBEC
81.0000 mg | DELAYED_RELEASE_TABLET | Freq: Every day | ORAL | Status: DC
  Administered 2020-05-19 – 2020-05-25 (×6): 81 mg via ORAL
  Filled 2020-05-18 (×7): qty 1

## 2020-05-18 MED ORDER — LEVETIRACETAM 500 MG PO TABS
500.0000 mg | ORAL_TABLET | Freq: Two times a day (BID) | ORAL | Status: DC
  Administered 2020-05-19 – 2020-05-25 (×12): 500 mg via ORAL
  Filled 2020-05-18 (×14): qty 1

## 2020-05-18 MED ORDER — LORAZEPAM 1 MG PO TABS: 1.0000 mg | ORAL_TABLET | ORAL | Status: AC | PRN

## 2020-05-18 MED ORDER — SPIRONOLACTONE 25 MG PO TABS
25.0000 mg | ORAL_TABLET | Freq: Every day | ORAL | Status: DC
  Administered 2020-05-19 – 2020-05-25 (×6): 25 mg via ORAL
  Filled 2020-05-18 (×7): qty 1

## 2020-05-18 MED ORDER — ADULT MULTIVITAMIN W/MINERALS CH
1.0000 | ORAL_TABLET | Freq: Every day | ORAL | Status: DC
  Administered 2020-05-19 – 2020-05-25 (×6): 1 via ORAL
  Filled 2020-05-18 (×7): qty 1

## 2020-05-18 MED ORDER — ENOXAPARIN SODIUM 40 MG/0.4ML ~~LOC~~ SOLN
40.0000 mg | Freq: Every day | SUBCUTANEOUS | Status: DC
  Administered 2020-05-19 – 2020-05-24 (×7): 40 mg via SUBCUTANEOUS
  Filled 2020-05-18 (×7): qty 0.4

## 2020-05-18 MED ORDER — LACTATED RINGERS IV BOLUS (SEPSIS)
250.0000 mL | Freq: Once | INTRAVENOUS | Status: AC
  Administered 2020-05-18: 250 mL via INTRAVENOUS

## 2020-05-18 MED ORDER — SODIUM CHLORIDE 0.9 % IV SOLN
3.0000 g | Freq: Four times a day (QID) | INTRAVENOUS | Status: DC
  Administered 2020-05-18 – 2020-05-22 (×14): 3 g via INTRAVENOUS
  Filled 2020-05-18 (×7): qty 8
  Filled 2020-05-18: qty 3
  Filled 2020-05-18 (×11): qty 8

## 2020-05-18 MED ORDER — IVABRADINE HCL 5 MG PO TABS
2.5000 mg | ORAL_TABLET | Freq: Two times a day (BID) | ORAL | Status: DC
  Administered 2020-05-19 – 2020-05-25 (×12): 2.5 mg via ORAL
  Filled 2020-05-18 (×15): qty 1

## 2020-05-18 MED ORDER — EZETIMIBE 10 MG PO TABS
10.0000 mg | ORAL_TABLET | Freq: Every day | ORAL | Status: DC
  Administered 2020-05-19 – 2020-05-25 (×6): 10 mg via ORAL
  Filled 2020-05-18 (×7): qty 1

## 2020-05-18 MED ORDER — LORAZEPAM 2 MG/ML IJ SOLN
1.0000 mg | INTRAMUSCULAR | Status: AC | PRN
  Administered 2020-05-21 (×3): 4 mg via INTRAVENOUS
  Filled 2020-05-18 (×4): qty 2

## 2020-05-18 MED ORDER — LACTATED RINGERS IV BOLUS (SEPSIS)
1000.0000 mL | Freq: Once | INTRAVENOUS | Status: AC
  Administered 2020-05-18: 1000 mL via INTRAVENOUS

## 2020-05-18 MED ORDER — ONDANSETRON HCL 4 MG/2ML IJ SOLN: 4.0000 mg | Freq: Four times a day (QID) | INTRAMUSCULAR | Status: DC | PRN

## 2020-05-18 MED ORDER — SACUBITRIL-VALSARTAN 24-26 MG PO TABS
1.0000 | ORAL_TABLET | Freq: Two times a day (BID) | ORAL | Status: DC
  Administered 2020-05-19 – 2020-05-25 (×12): 1 via ORAL
  Filled 2020-05-18 (×15): qty 1

## 2020-05-18 MED ORDER — SODIUM CHLORIDE 0.9 % IV SOLN
2.0000 g | Freq: Once | INTRAVENOUS | Status: AC
  Administered 2020-05-18: 2 g via INTRAVENOUS
  Filled 2020-05-18: qty 20

## 2020-05-18 MED ORDER — VANCOMYCIN HCL IN DEXTROSE 1-5 GM/200ML-% IV SOLN
1000.0000 mg | Freq: Once | INTRAVENOUS | Status: AC
  Administered 2020-05-18: 1000 mg via INTRAVENOUS
  Filled 2020-05-18: qty 200

## 2020-05-18 MED FILL — ENTRESTO 24 MG-26 MG TABLET: 24-26 | 30 days supply | Qty: 60 | Fill #1

## 2020-05-18 NOTE — H&P (Signed)
History and Physical    [name] OXB:[id number] DOB: 03/13/1961 DOA: 05/18/2020  PCP: Claiborne Rigg, NP  Patient coming from: Home   Chief Complaint:  Chief Complaint  Patient presents with  . 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  Patient explains that approximately 2 days ago he began to experience redness warmth and swelling of the dorsum of the left hand.  The 24 to 48 hours that followed the symptoms rapidly worsened and progressed proximally up the left arm.  This is been associated with severe left upper extremity pain, throbbing in quality, radiating proximally and worse with movement of the affected extremity.  Patient is also complaining of generalized weakness.  Patient denies any changes in appetite, nausea, vomiting or fevers.  Patient denies sick contacts, recent travel, shortness of breath, cough or confirmed contacts with COVID-19.  Patient symptoms continue to worsen.  Patient was paid a visit by his community paramedic this morning who saw his impressive left upper extremity cellulitis and urged the patient to come to the hospital.  At that point, EMS was called and patient was brought into [facility] emergency department.  On evaluation in the emergency department patient was clinically felt to be suffering from a left upper extremity cellulitis.  Patient was initiated on intravenous ceftriaxone and vancomycin.  Patient was hydrated with 2250 cc of lactated  Ringer solution.  Patient was noted to be suffering from a lactic acidosis of 2.2 as well as leukocytosis of 13.7.  Blood cultures were obtained.  The hospitalist group was then called to assess the patient for admission the hospital.     Review of Systems: A 10-system review of systems has been performed and all systems are negative with the exception of what is listed in the HPI.    Past Medical History:  Diagnosis Date  . AICD (automatic cardioverter/defibrillator) present   . Alcohol abuse 05/18/2020  . Arthritis    "hands; maybe my toes" (12/20/2017)  . CAD (coronary artery disease)   . Cardiac arrest (HCC) 11/29/2017   hx VF arrest/notes 12/20/2017  . Chronic systolic heart failure (HCC) 08/25/2019  . Coronary artery disease   . Coronary artery disease involving native coronary artery of native heart without angina pectoris 12/07/2017  . Essential hypertension 05/18/2020  . GERD (gastroesophageal reflux disease)   . H/O ETOH abuse    /notes 12/20/2017  . High cholesterol   . Hypertension   . Ischemic cardiomyopathy    a. 08/2019 s/p BSX D232 single lead AICD.  Marland Kitchen Mixed hyperlipidemia 05/18/2020  . Seizure (HCC) 10/21/2019  . Seizures (HCC) ~ 2016; ~ 2017   "I've had a couple; I was at work" (12/20/2017)  . 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 Syncope and collapse    "last few days" (12/20/2017)    Past Surgical History:  Procedure Laterality Date  . CARDIAC CATHETERIZATION    . CORONARY ARTERY BYPASS GRAFT N/A 12/07/2017   Procedure: CORONARY ARTERY BYPASS GRAFTING (CABG) times four using  left internal mammary artery and right saphenous vein using endoscope for harvest.;  Surgeon: [name], MD;  Location: [facility] OR;  Service: Open Heart Surgery;  Laterality: N/A;  . IABP INSERTION N/A 11/29/2017   Procedure: IABP Insertion;  Surgeon: [name], MD;  Location: [facility] INVASIVE CV LAB;   Service: Cardiovascular;  Laterality: N/A;  . IABP INSERTION N/A 12/03/2017   Procedure: IABP INSERTION;  Surgeon: [name], MD;  Location: [facility] INVASIVE CV LAB;  Service: Cardiovascular;  Laterality: N/A;  . ICD IMPLANT N/A 08/29/2019   Procedure: ICD IMPLANT;  Surgeon: Marinus Maw, MD;  Location[facility]VASIVE CV LAB;  Service: Cardiovascular;  Laterality: N/A;  . LEFT HEART CATH AND CORONARY ANGIOGRAPHY N/A 11/29/2017   Procedure: LEFT HEART CATH AND CORONARY ANGIOGRAPHY;  Surgeon: [name], MD;  Location: [facility] INVASIVE CV LAB;  Service: Cardiovascular;  Laterality: N/A;  . RIGHT HEART CATH N/A 11/29/2017   Procedure: RIGHT HEART CATH;  Surgeon: [name], MD;  Location: [facility] INVASIVE CV LAB;  Service: Cardiovascular;  Laterality: N/A;  . TEE WITHOUT CARDIOVERSION N/A 12/07/2017   Procedure: TRANSESOPHAGEAL ECHOCARDIOGRAM (TEE);  Surgeon: Donata Clay, Theron Arista, MD;  Location: [facility] OR;  Service: Open Heart Surgery;  Laterality: N/A;  . TONSILLECTOMY AND ADENOIDECTOMY  ~ 1972     reports that he has never smoked. He has never used smokeless tobacco. He reports current alcohol use of about 14.0 standard drinks of alcohol per week. He reports that he does not use drugs.  No Known Allergies  Family History  Problem Relation Age of Onset  . Heart attack Other   . Diabetes Neg Hx      Prior to Admission medications   Medication Sig Start Date End Date Taking? Authorizing Provider  atorvastatin (LIPITOR) 80 MG tablet Take 1 tablet (80 mg total) by mouth daily at 6 PM. 11/24/19  Yes [name], Bevelyn Buckles, MD  carvedilol (COREG) 3.125 MG tablet Take 1 tablet (3.125 mg total) by mouth 2 (two) times daily with a meal. 01/10/20  Yes Vann, Jessica U, DO  ezetimibe (ZETIA) 10 MG tablet Take 1 tablet (10 mg total) by mouth daily. 04/06/20  Yes Robbie Lis M, PA-C  folic acid (FOLVITE) 1 MG tablet Take 1 tablet (1 mg total) by mouth daily. 04/06/20  Yes Robbie Lis M, PA-C    furosemide (LASIX) 40 MG tablet Take 1 tablet (40 mg total) by mouth daily as needed for fluid. 04/06/20  Yes Robbie Lis M, PA-C  ivabradine (CORLANOR) 5 MG TABS tablet Take 1 tablet (5 mg total) by mouth 2 (two) times daily with a meal. Patient taking differently: Take 2.5 mg by mouth 2 (two) times daily with a meal.  04/21/20  Yes [name], Bevelyn Buckles, MD  levETIRAcetam (KEPPRA) 500 MG tablet Take 1 tablet (500 mg total) by mouth 2 (two) times daily. 12/30/19  Yes Anson Fret, MD  sacubitril-valsartan (ENTRESTO) 24-26 MG Take 1 tablet by mouth 2 (two) times daily. 04/06/20  Yes Robbie Lis M, PA-C  spironolactone (ALDACTONE) 25 MG tablet Take 1 tablet (25 mg total) by mouth daily. 04/06/20  Yes Robbie Lis M, PA-C  thiamine 100 MG tablet Take 1 tablet (100 mg total) by mouth daily. 04/06/20  Yes Allayne Butcher, PA-C    Physical Exam: Vitals:   05/18/20 1930 05/18/20 1945 05/18/20 1949 05/18/20 1958  BP: 135/81 140/89    Pulse: 93 97  87  Resp: (!) 26   16  Temp:    98.4 F (36.9 C)  TempSrc:    Oral  SpO2: 99% 98%  99%  Weight:   75 kg   Height:   5\' 10"  (1.778 m)     Constitutional: Acute alert and oriented x3, no associated distress.   Skin: Extensive redness with associated warmth of the dorsum of the left upper extremity that tracks of the left upper extremity all the way up to the left axilla.  Slightly poor skin turgor noted. Eyes: Pupils are equally reactive to light.  No evidence of scleral icterus or conjunctival pallor.  ENMT: Moist mucous membranes noted.  Posterior pharynx clear of any exudate or lesions.   Neck: normal, supple, no masses, no thyromegaly.  No evidence of jugular venous distension.   Respiratory: clear to auscultation bilaterally, no wheezing, no crackles. Normal respiratory effort. No accessory muscle use.  Cardiovascular: Regular rate and rhythm, no murmurs / rubs / gallops.  Notable edema of the left upper extremity.  2+ pedal  pulses. No carotid bruits.  Chest:   Nontender without crepitus or deformity.   Back:   Nontender without crepitus or deformity. Abdomen: Abdomen is soft and nontender.  No evidence of intra-abdominal masses.  Positive bowel sounds noted in all quadrants.   Musculoskeletal: Severe tenderness with associated deformity of the left upper extremity, please see skin examination findings above.  Limited range of motion of the left wrist and digits of the left hand due to edema.   Normal muscle tone.  Neurologic: CN 2-12 grossly intact. Sensation intact, patient is able to move all 4 extremities spontaneously.  Patient is following all commands.  Patient is responsive to verbal stimuli.   Psychiatric: Patient presents as a depressed mood with flat affect.  Patient seems to possess insight as to theircurrent situation.     Labs on Admission: I have personally reviewed following labs and imaging studies -   CBC: Recent Labs  Lab 05/18/20 1242  WBC 13.7*  NEUTROABS 11.5*  HGB 15.6  HCT 46.0  MCV 100.2*  PLT 062   Basic Metabolic Panel: Recent Labs  Lab 05/18/20 1242  NA 132*  K 5.1  CL 96*  CO2 26  GLUCOSE 124*  BUN 7  CREATININE 1.01  CALCIUM 9.2   GFR: Estimated Creatinine Clearance: 83.3 mL/min (by C-G formula based on SCr of 1.01 mg/dL). Liver Function Tests: Recent Labs  Lab 05/18/20 1242  AST 28  ALT 24  ALKPHOS 66  BILITOT 1.2  PROT 7.6  ALBUMIN 3.8   No results for input(s): LIPASE, AMYLASE in the last 168 hours. No results for input(s): AMMONIA in the last 168 hours. Coagulation Profile: Recent Labs  Lab 05/18/20 1242  INR 1.0   Cardiac Enzymes: No results for input(s): CKTOTAL, CKMB, CKMBINDEX, TROPONINI in the last 168 hours. BNP (last 3 results) No results for input(s): PROBNP in the last 8760 hours. HbA1C: No results for input(s): HGBA1C in the last 72 hours. CBG: No results for input(s): GLUCAP in the last 168 hours. Lipid Profile: No results for  input(s): CHOL, HDL, LDLCALC, TRIG, CHOLHDL, LDLDIRECT in the last 72 hours. Thyroid Function Tests: No results for input(s): TSH, T4TOTAL, FREET4, T3FREE, THYROIDAB in the last 72 hours. Anemia Panel: No results for input(s): VITAMINB12, FOLATE, FERRITIN, TIBC, IRON, RETICCTPCT in the last 72 hours. Urine analysis:    Component Value Date/Time   COLORURINE COLORLESS (A) 01/08/2020 1922   APPEARANCEUR CLEAR 01/08/2020 1922   LABSPEC 1.002 (L) 01/08/2020  1922   PHURINE 7.0 01/08/2020 1922   GLUCOSEU NEGATIVE 01/08/2020 1922   HGBUR NEGATIVE 01/08/2020 1922   BILIRUBINUR NEGATIVE 01/08/2020 1922   KETONESUR NEGATIVE 01/08/2020 1922   PROTEINUR NEGATIVE 01/08/2020 1922   NITRITE NEGATIVE 01/08/2020 1922   LEUKOCYTESUR NEGATIVE 01/08/2020 1922    Radiological Exams on Admission - Personally Reviewed: DG Wrist Complete Left  Result Date: 05/18/2020 CLINICAL DATA:  Pt has severe swelling and redness to left hand radiating up his left arm. Reports that it could be an insect bite. Pt had very limited movement. EXAM: LEFT WRIST - COMPLETE 3+ VIEW COMPARISON:  None. FINDINGS: There is a separate bone fragment along the ulnar aspect of the distal ulna which could reflect an old fracture of the ulnar styloid or be developmental, the latter suspected. No acute fracture.  No bone lesion. Wrist joints are normally spaced and aligned. No arthropathic changes. There is diffuse surrounding soft tissue swelling in dense arterial vascular calcifications. IMPRESSION: 1. No acute fracture.  No joint abnormality.  No bone lesion. 2. Diffuse nonspecific soft tissue swelling. Electronically Signed   By: Amie [place] M.D.   On: 05/18/2020 13:04   DG Hand Complete Left  Result Date: 05/18/2020 CLINICAL DATA:  Pt has severe swelling and redness to left hand radiating up his left arm. Reports that it could be an insect bite. Pt had very limited movement EXAM: LEFT HAND - COMPLETE 3+ VIEW COMPARISON:  None. FINDINGS:  No acute fracture. No bone lesion. There is joint space narrowing most evident at the second MCP joint. No periarticular erosions. There is diffuse soft tissue swelling that is most evident over the dorsal aspect of the hand extending to the wrist. Dense arterial vascular calcifications are noted. IMPRESSION: 1. No acute fracture.  No dislocation. 2. Arthropathic changes most evident at the second and third metacarpophalangeal joints, which may reflect an inflammatory arthropathy given the joint involvement. No periarticular erosion is seen, however. 3. Diffuse nonspecific soft tissue swelling most prominent dorsally. Electronically Signed   By: Amie [place] M.D.   On: 05/18/2020 13:03   [place] LT UPPER EXTREM LTD SOFT TISSUE NON VASCULAR  Result Date: 05/18/2020 CLINICAL DATA:  Redness, pain and swelling involving the right hand. EXAM: ULTRASOUND right UPPER EXTREMITY LIMITED TECHNIQUE: Ultrasound examination of the upper extremity soft tissues was performed in the area of clinical concern. COMPARISON:  Radiographs 05/18/2020 FINDINGS: Extensive dorsal soft tissue swelling/edema/fluid suggesting severe cellulitis. No discrete drainable fluid collection to suggest an abscess. IMPRESSION: Marked inflammation/edema and interstitial fluid throughout the dorsum of the hand but no discrete abscess. Electronically Signed   By: Rudie Meyer M.D.   On: 05/18/2020 19:09    EKG: Personally reviewed.  Rhythm is normal sinus rhythm with heart rate of 94 bpm.  No dynamic ST segment changes appreciated.  Assessment/Plan Principal Problem:   Cellulitis of left upper extremity   Extensive left upper extremity cellulitis with rapid progression requiring inpatient hospitalization and initiation of broad-spectrum intravenous antibiotic therapy.  For coverage of gram-negative's, anaerobes and gram-positive including possible MRSA patient is been placed on intravenous vancomycin and Unasyn.  X-ray of the left upper  extremity reveals no evidence of osteomyelitis  Ultrasound of the left upper extremity reveals no obvious evidence of abscess formation  Blood cultures have been obtained  Patient is already received 2250 cc of lactated Ringer's by the emergency department provider, will attempt to maintain euvolemia for now considering patient's history of substantial systolic and diastolic congestive  heart failure  Opiate-based analgesics for substantial associated pain  If patient clinically worsens will consider involvement of hand surgery or infectious disease later in the hospitalization.  Active Problems:   Coronary artery disease involving native coronary artery of native heart without angina pectoris   Patient is currently chest pain-free  Continue statin, beta-blocker therapy  Monitor patient on telemetry  Continue aspirin    Chronic diastolic and systolic heart failure (HCC)  No clinical evidence of volume overload at this time  We will likely resume home regimen of spironolactone tomorrow  Continue home regimen of Entresto    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, 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)   Continue home regimen of Keppra    Essential hypertension   Continue home regimen of Entresto, Coreg    Mixed hyperlipidemia   Continue home regimen of statin, Zetia    Alcohol abuse   Patient admits to continued use of alcohol, at least 1-2 beers daily although this may be underrepresented  As a precaution, will place patient on CIWA protocol with as needed benzodiazepines  MVI, folate, thiamine daily    Lactic acidosis  Lactic acidosis already improving after initial lactated Ringer bolus  Continuing treatment of underlying infection with intravenous antibiotic therapy   Code Status:  Full code Family Communication: Deferred  Status is: Observation  The patient remains OBS appropriate and will d/c before 2  midnights.  Dispo: The patient is from: Home              Anticipated d/c is to: Home              Anticipated d/c date is: 2 days              Patient currently is not medically stable to d/c.        Marinda ElkGeorge J Mizani Dilday MD [facility] Pager [phone]  If 7PM-7AM, please contact night-coverage [email or web address] Use universal [place] password for that web site. If you do not have the password, please call the hospital operator.  05/18/2020, 9:14 PM

## 2020-05-18 NOTE — Progress Notes (Signed)
Paramedicine Encounter    Patient ID: [name], male    DOB: 1961/05/28, 59 y.o.   MRN: [id number]   Patient Care Team: Gildardo Pounds, NP as PCP - General (Nurse Practitioner) Evans Lance, MD as PCP - Electrophysiology (Cardiology)  Patient Active Problem List   Diagnosis Date Noted  . 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 Seizure ([place]) 10/21/2019  . ICD (implantable cardioverter-defibrillator) in place 08/29/2019  . Chronic systolic heart failure ([place]) 08/25/2019  . Orthostatic hypotension 12/21/2017  . Syncope 12/20/2017  . 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 Coronary artery disease 12/07/2017  . Aspiration pneumonia (HCC)     Current Outpatient Medications:  .  atorvastatin (LIPITOR) 80 MG tablet, Take 1 tablet (80 mg total) by mouth daily at 6 PM., Disp: 90 tablet, Rfl: 3 .  carvedilol (COREG) 3.125 MG tablet, Take 1 tablet (3.125 mg total) by mouth 2 (two) times daily with a meal., Disp: 60 tablet, Rfl: 0 .  ezetimibe (ZETIA) 10 MG tablet, Take 1 tablet (10 mg total) by mouth daily., Disp: 30 tablet, Rfl: 6 .  folic acid (FOLVITE) 1 MG tablet, Take 1 tablet (1 mg total) by mouth daily., Disp: 30 tablet, Rfl: 6 .  ivabradine (CORLANOR) 5 MG TABS tablet, Take 1 tablet (5 mg total) by mouth 2 (two) times daily with a meal., Disp: 60 tablet, Rfl: 6 .  levETIRAcetam (KEPPRA) 500 MG tablet, Take 1 tablet (500 mg total) by mouth 2 (two) times daily., Disp: 180 tablet, Rfl: 3 .  sacubitril-valsartan (ENTRESTO) 24-26 MG, Take 1 tablet by mouth 2 (two) times daily., Disp: 60 tablet, Rfl: 6 .  spironolactone (ALDACTONE) 25 MG tablet, Take 1 tablet (25 mg total) by mouth daily., Disp: 30 tablet, Rfl: 6 .  thiamine 100 MG tablet, Take 1 tablet (100 mg total) by mouth daily., Disp: 30 tablet, Rfl: 6 .  furosemide (LASIX) 40 MG tablet, Take 1 tablet (40 mg total) by mouth daily as needed for fluid.,  Disp: 15 tablet, Rfl: 6 No Known Allergies   Social History   Socioeconomic History  . Marital status: Divorced    Spouse name: Not on file  . Number of children: Not on file  . Years of education: Not on file  . Highest education level: Not on file  Occupational History  . Not on file  Tobacco Use  . Smoking status: Never Smoker  . Smokeless tobacco: Never Used  Substance and Sexual Activity  . Alcohol use: Yes    Alcohol/week: 0.0 - 42.0 standard drinks    Comment: 12/20/2017 "I quit drinking after I had the heart attack"  . Drug use: No  . Sexual activity: Not Currently  Other Topics Concern  . Not on file  Social History Narrative   Lives at home with his brother who is crippled. He takes care of his brother.    Right handed    Social Determinants of Health   Financial Resource Strain:   . Difficulty of Paying Living Expenses:   Food Insecurity:   . Worried About Charity fundraiser in the Last Year:   . Arboriculturist in the Last Year:   Transportation Needs:   . Film/video editor (Medical):   Marland Kitchen Lack of Transportation (Non-Medical):   Physical Activity:   . Days of Exercise per Week:   . Minutes of Exercise per Session:  Stress:   . Feeling of Stress :   Social Connections:   . Frequency of Communication with Friends and Family:   . Frequency of Social Gatherings with Friends and Family:   . Attends Religious Services:   . Active Member of Clubs or Organizations:   . Attends Banker Meetings:   Marland Kitchen Marital Status:   Intimate Partner Violence:   . Fear of Current or Ex-Partner:   . Emotionally Abused:   Marland Kitchen Physically Abused:   . Sexually Abused:     Physical Exam Vitals reviewed.  Constitutional:      Appearance: He is normal weight.  HENT:     Head: Normocephalic.     Nose: Nose normal.     Mouth/Throat:     Mouth: Mucous membranes are moist.     Pharynx: Oropharynx is clear.  Eyes:     Pupils: Pupils are equal, round, and  reactive to light.  Cardiovascular:     Rate and Rhythm: Normal rate and regular rhythm.     Pulses: Normal pulses.     Heart sounds: Normal heart sounds.  Pulmonary:     Effort: Pulmonary effort is normal.     Breath sounds: Normal breath sounds.  Abdominal:     General: Abdomen is flat.     Palpations: Abdomen is soft.  Musculoskeletal:        General: Swelling and tenderness present. Normal range of motion.     Cervical back: Normal range of motion.     Right lower leg: No edema.     Left lower leg: No edema.     Comments: Left hand and arm swelling with redness and tenderness.   Skin:    General: Skin is warm and dry.     Capillary Refill: Capillary refill takes less than 2 seconds.  Neurological:     Mental Status: He is alert. Mental status is at baseline.  Psychiatric:        Mood and Affect: Mood normal.      Arrived for home visit for [name] who was ambulating outside on his porch on my arrival with no shortness of breath witnessed. [name] left arm was swollen and red in color and tender to the touch. [name] reports he thinks he may have gotten bitten by an insect. [name] had pulses in the left wrist but they were faint. Vitals were as noted. Medications were reviewed and verified. Pill box filled accordingly.  [name] agreed to be transported to the ED at [facility] for further evaluation. [facility] arrived and transported [name] to [place]. I will see [name] in one week.     Refills: [name]   Future Appointments  Date Time Provider Department Center  05/31/2020  7:10 AM [facility] DEVICE REMOTES [facility]  06/15/2020  9:00 AM [facility] PA/NP [facility] None  11/29/2020  7:10 AM [facility] DEVICE REMOTES [facility]     ACTION: Home visit completed Next visit planned for one week

## 2020-05-18 NOTE — ED Provider Notes (Signed)
[facility] Provider Note   CSN: [id number] Arrival date & time: 05/18/20  1131     History Chief Complaint  Patient presents with  . Wound Infection    Briant CedarWesley Clay Banfield is a 59 y.o. male with history of CAD status post CABG, AICD present, GERD, hyperlipidemia, hypertension, ischemic cardiomyopathy presenting for evaluation of acute onset, progressively worsening left upper extremity swelling, erythema, and pain for several days.  He reports that symptoms began after he was rummaging around a bush outside looking for glass bottles.  He states that he thinks an insect might have bit him.  He also notes that around the same time he did fall and landed on his left wrist but is unsure if that could be contributing to his symptoms.  He notes significant swelling, soreness to the dorsum of the right hand, difficulty making a fist.  Also noted a wound to the dorsum of the hand which will occasionally drain purulent drainage intermittently.  Yesterday into today he noted to streaking of erythema up the extremity which has been progressively worsening.  He denies numbness or tingling of the extremity.  He denies fevers, chills, chest pain, shortness of breath, abdominal pain.  He did have some nausea earlier today but no vomiting.  He has not tried anything for his symptoms.  He is unsure if his tetanus is up-to-date, per chart review it expires March 09, 2021.  He vehemently denies IV drug use.  The history is provided by the patient.       Past Medical History:  Diagnosis Date  . AICD (automatic cardioverter/defibrillator) present   . Arthritis    "hands; maybe my toes" (12/20/2017)  . CAD (coronary artery disease)   . Cardiac arrest (HCC) 11/29/2017   hx VF arrest/notes 12/20/2017  . Coronary artery disease   . GERD (gastroesophageal reflux disease)   . H/O ETOH abuse    /notes 12/20/2017  . High cholesterol   . Hypertension   . Ischemic  cardiomyopathy    a. 08/2019 s/p BSX D232 single lead AICD.  Marland Kitchen. Seizure (HCC) 10/21/2019  . Seizures (HCC) ~ 2016; ~ 2017   "I've had a couple; I was at work" (12/20/2017)  . Syncope and collapse    "last few days" (12/20/2017)    Patient Active Problem List   Diagnosis Date Noted  . 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 Seizure (HCC) 10/21/2019  . ICD (implantable cardioverter-defibrillator) in place 08/29/2019  . Chronic systolic heart failure (HCC) 08/25/2019  . Orthostatic hypotension 12/21/2017  . Syncope 12/20/2017  . 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 Coronary artery disease 12/07/2017  . Aspiration pneumonia [facility](HCC)     Past Surgical History:  Procedure Laterality Date  . CARDIAC CATHETERIZATION    . CORONARY ARTERY BYPASS GRAFT N/A 12/07/2017   Procedure: CORONARY ARTERY BYPASS GRAFTING (CABG) times four using left internal mammary artery and right saphenous vein using endoscope for harvest.;  Surgeon: Kerin PernaVan Trigt, Peter, MD;  [facility];  Service: Open Heart Surgery;  Laterality: N/A;  . IABP INSERTION N/A 11/29/2017   Procedure: IABP Insertion;  Surgeon: [name], [name], MD;  Location: [facility] INVASIVE CV LAB;  Service: Cardiovascular;  Laterality: N/A;  . IABP INSERTION N/A 12/03/2017   Procedure: IABP INSERTION;  Surgeon: Dolores PattyBensimhon, [name], MD;  Location: [facility] INVASIVE CV LAB;  Service: Cardiovascular;  Laterality: N/A;  . ICD IMPLANT N/A  08/29/2019   Procedure: ICD IMPLANT;  Surgeon: Evans Lance, MD;  Location: [place] CV LAB;  Service: Cardiovascular;  Laterality: N/A;  . LEFT HEART CATH AND CORONARY ANGIOGRAPHY N/A 11/29/2017   Procedure: LEFT HEART CATH AND CORONARY ANGIOGRAPHY;  Surgeon: Nelva Bush, MD;  Location: [place] CV LAB;  Service: Cardiovascular;  Laterality: N/A;  . RIGHT HEART CATH N/A 11/29/2017   Procedure: RIGHT HEART CATH;  Surgeon: Jolaine Artist, MD;   Location: [place] CV LAB;  Service: Cardiovascular;  Laterality: N/A;  . TEE WITHOUT CARDIOVERSION N/A 12/07/2017   Procedure: TRANSESOPHAGEAL ECHOCARDIOGRAM (TEE);  Surgeon: Prescott Gum, Collier Salina, MD;  Location: [place];  Service: Open Heart Surgery;  Laterality: N/A;  . TONSILLECTOMY AND ADENOIDECTOMY  ~ 1972       Family History  Problem Relation Age of Onset  . Heart attack Other   . Diabetes Neg Hx     Social History   Tobacco Use  . Smoking status: Never Smoker  . Smokeless tobacco: Never Used  Substance Use Topics  . Alcohol use: Yes    Alcohol/week: 0.0 - 42.0 standard drinks    Comment: 12/20/2017 "I quit drinking after I had the heart attack"  . Drug use: No    Home Medications Prior to Admission medications   Medication Sig Start Date End Date Taking? Authorizing Provider  atorvastatin (LIPITOR) 80 MG tablet Take 1 tablet (80 mg total) by mouth daily at 6 PM. 11/24/19  Yes [name], Shaune Pascal, MD  carvedilol (COREG) 3.125 MG tablet Take 1 tablet (3.125 mg total) by mouth 2 (two) times daily with a meal. 01/10/20  Yes Vann, Jessica U, DO  ezetimibe (ZETIA) 10 MG tablet Take 1 tablet (10 mg total) by mouth daily. 04/06/20  Yes Lyda Jester M, PA-C  folic acid (FOLVITE) 1 MG tablet Take 1 tablet (1 mg total) by mouth daily. 04/06/20  Yes Lyda Jester M, PA-C  furosemide (LASIX) 40 MG tablet Take 1 tablet (40 mg total) by mouth daily as needed for fluid. 04/06/20  Yes Lyda Jester M, PA-C  ivabradine (CORLANOR) 5 MG TABS tablet Take 1 tablet (5 mg total) by mouth 2 (two) times daily with a meal. Patient taking differently: Take 2.5 mg by mouth 2 (two) times daily with a meal.  04/21/20  Yes [name], Shaune Pascal, MD  levETIRAcetam (KEPPRA) 500 MG tablet Take 1 tablet (500 mg total) by mouth 2 (two) times daily. 12/30/19  Yes Melvenia Beam, MD  sacubitril-valsartan (ENTRESTO) 24-26 MG Take 1 tablet by mouth 2 (two) times daily. 04/06/20  Yes Lyda Jester M,  PA-C  spironolactone (ALDACTONE) 25 MG tablet Take 1 tablet (25 mg total) by mouth daily. 04/06/20  Yes Lyda Jester M, PA-C  thiamine 100 MG tablet Take 1 tablet (100 mg total) by mouth daily. 04/06/20  Yes Consuelo Pandy, PA-C    Allergies    Patient has no known allergies.  Review of Systems   Review of Systems  Constitutional: Negative for chills and fever.  Gastrointestinal: Positive for nausea. Negative for vomiting.  Musculoskeletal: Positive for arthralgias.  Skin: Positive for color change and wound.  Neurological: Negative for weakness and numbness.  All other systems reviewed and are negative.   Physical Exam Updated Vital Signs BP 140/89   Pulse 87   Temp 98.4 F (36.9 C) (Oral)   Resp 16   Ht 5\' 10"  (1.778 m)   Wt 75 kg   SpO2 99%  BMI 23.72 kg/m   Physical Exam Vitals and nursing note reviewed.  Constitutional:      General: He is not in acute distress.    Appearance: He is well-developed.  HENT:     Head: Normocephalic and atraumatic.  Eyes:     General:        Right eye: No discharge.        Left eye: No discharge.     Conjunctiva/sclera: Conjunctivae normal.  Neck:     Vascular: No JVD.     Trachea: No tracheal deviation.  Cardiovascular:     Rate and Rhythm: Normal rate.     Comments: Well-healed midline sternotomy scar Pulmonary:     Effort: Pulmonary effort is normal.  Abdominal:     General: Bowel sounds are normal. There is no distension.     Palpations: Abdomen is soft.     Tenderness: There is no abdominal tenderness. There is no guarding.  Musculoskeletal:        General: Swelling and tenderness present.     Cervical back: Neck supple.     Comments: See below image.  Patient has marked swelling and erythema to the dorsum of the left hand extending to the forearm.  He has streaking of erythema extending up to the axilla.  Limited range of motion of the digits but he is mostly able to make a fist.  He is able to flex and  extend the digits against resistance without difficulty.  Skin:    General: Skin is warm and dry.     Capillary Refill: Capillary refill takes 2 to 3 seconds.     Findings: No erythema.  Neurological:     Mental Status: He is alert.     Comments: Sensation intact to light touch of bilateral upper extremities.  Psychiatric:        Behavior: Behavior normal.          ED Results / Procedures / Treatments   Labs (all labs ordered are listed, but only abnormal results are displayed) Labs Reviewed  COMPREHENSIVE METABOLIC PANEL - Abnormal; Notable for the following components:      Result Value   Sodium 132 (*)    Chloride 96 (*)    Glucose, Bld 124 (*)    All other components within normal limits  LACTIC ACID, PLASMA - Abnormal; Notable for the following components:   Lactic Acid, Venous 2.2 (*)    All other components within normal limits  CBC WITH DIFFERENTIAL/PLATELET - Abnormal; Notable for the following components:   WBC 13.7 (*)    MCV 100.2 (*)    Neutro Abs 11.5 (*)    Lymphs Abs 0.6 (*)    Monocytes Absolute 1.4 (*)    Abs Immature Granulocytes 0.09 (*)    All other components within normal limits  SARS CORONAVIRUS 2 BY RT PCR (HOSPITAL ORDER, PERFORMED IN [facility])  CULTURE, BLOOD (ROUTINE X 2)  CULTURE, BLOOD (ROUTINE X 2)  CULTURE, BLOOD (ROUTINE X 2)  CULTURE, BLOOD (ROUTINE X 2)  URINE CULTURE  MRSA PCR SCREENING  LACTIC ACID, PLASMA  PROTIME-INR    EKG None  Radiology DG Wrist Complete Left  Result Date: 05/18/2020 CLINICAL DATA:  Pt has severe swelling and redness to left hand radiating up his left arm. Reports that it could be an insect bite. Pt had very limited movement. EXAM: LEFT WRIST - COMPLETE 3+ VIEW COMPARISON:  None. FINDINGS: There is a separate bone fragment along  the ulnar aspect of the distal ulna which could reflect an old fracture of the ulnar styloid or be developmental, the latter suspected. No acute fracture.  No  bone lesion. Wrist joints are normally spaced and aligned. No arthropathic changes. There is diffuse surrounding soft tissue swelling in dense arterial vascular calcifications. IMPRESSION: 1. No acute fracture.  No joint abnormality.  No bone lesion. 2. Diffuse nonspecific soft tissue swelling. Electronically Signed   By: Amie Portland M.D.   On: 05/18/2020 13:04   DG Hand Complete Left  Result Date: 05/18/2020 CLINICAL DATA:  Pt has severe swelling and redness to left hand radiating up his left arm. Reports that it could be an insect bite. Pt had very limited movement EXAM: LEFT HAND - COMPLETE 3+ VIEW COMPARISON:  None. FINDINGS: No acute fracture. No bone lesion. There is joint space narrowing most evident at the second MCP joint. No periarticular erosions. There is diffuse soft tissue swelling that is most evident over the dorsal aspect of the hand extending to the wrist. Dense arterial vascular calcifications are noted. IMPRESSION: 1. No acute fracture.  No dislocation. 2. Arthropathic changes most evident at the second and third metacarpophalangeal joints, which may reflect an inflammatory arthropathy given the joint involvement. No periarticular erosion is seen, however. 3. Diffuse nonspecific soft tissue swelling most prominent dorsally. Electronically Signed   By: Amie Portland M.D.   On: 05/18/2020 13:03   [place] LT UPPER EXTREM LTD SOFT TISSUE NON VASCULAR  Result Date: 05/18/2020 CLINICAL DATA:  Redness, pain and swelling involving the right hand. EXAM: ULTRASOUND right UPPER EXTREMITY LIMITED TECHNIQUE: Ultrasound examination of the upper extremity soft tissues was performed in the area of clinical concern. COMPARISON:  Radiographs 05/18/2020 FINDINGS: Extensive dorsal soft tissue swelling/edema/fluid suggesting severe cellulitis. No discrete drainable fluid collection to suggest an abscess. IMPRESSION: Marked inflammation/edema and interstitial fluid throughout the dorsum of the hand but no  discrete abscess. Electronically Signed   By: Rudie Meyer M.D.   On: 05/18/2020 19:09    Procedures .Critical Care Performed by: Jeanie Sewer, PA-C Authorized by: Jeanie Sewer, PA-C   Critical care provider statement:    Critical care time (minutes):  45   Critical care was necessary to treat or prevent imminent or life-threatening deterioration of the following conditions:  Sepsis   Critical care was time spent personally by me on the following activities:  Discussions with consultants, evaluation of patient's response to treatment, examination of patient, ordering and performing treatments and interventions, ordering and review of laboratory studies, ordering and review of radiographic studies, pulse oximetry, re-evaluation of patient's condition, obtaining history from patient or surrogate and review of old charts   (including critical care time)  Medications Ordered in ED Medications  vancomycin (VANCOREADY) IVPB 750 mg/150 mL (has no administration in time range)  lactated ringers bolus 1,000 mL (0 mLs Intravenous Stopped 05/18/20 1944)    And  lactated ringers bolus 1,000 mL (0 mLs Intravenous Stopped 05/18/20 1944)    And  lactated ringers bolus 250 mL (0 mLs Intravenous Stopped 05/18/20 1957)  vancomycin (VANCOCIN) IVPB 1000 mg/200 mL premix (1,000 mg Intravenous New Bag/Given 05/18/20 1925)  cefTRIAXone (ROCEPHIN) 2 g in sodium chloride 0.9 % 100 mL IVPB (0 g Intravenous Stopped 05/18/20 1944)    ED Course  I have reviewed the triage vital signs and the nursing notes.  Pertinent labs & imaging results that were available during my care of the patient were reviewed by me  and considered in my medical decision making (see chart for details).    MDM Rules/Calculators/A&P                      Patient presenting for evaluation of progressively worsening pain swelling and erythema involving the dorsum of the left hand extending into the left upper extremity.  Left upper extremity  clearly cellulitic with ascending lymphangitis up to the axilla.  The patient is afebrile, vital signs are stable.  He is uncomfortable but nontoxic in appearance.  He is neurovascularly intact.  He has some difficulty forming a fist due to the degree of sore no concern for compartment syndrome and otherwise strength and sensation are intact.  Plain films obtained in triage show no acute fracture or joint abnormality.  There is diffuse nonspecific soft tissue swelling on x-rays.  Due to the patient having a defibrillator, he underwent ultrasound to evaluate for potential abscess to determine whether or not hand surgery should be involved in his care.  Ultrasound does not show any evidence of discrete fluid collection.  Lab work reviewed and interpreted by myself shows leukocytosis, elevated lactic acid level.  Blood cultures were obtained and he was given broad-spectrum antibiotics and 30 cc/kg fluid bolus.,  No renal insufficiency or metabolic derangements.  Spoke with Dr. Leafy Half with [facility] hospitalist service who agrees to assume care of patient and bring him into the hospital for further evaluation and management. Final Clinical Impression(s) / ED Diagnoses Final diagnoses:  Cellulitis of left upper extremity    Rx / DC Orders ED Discharge Orders    None       Bennye Alm 05/18/20 2037    Benjiman Core, MD 05/18/20 2340

## 2020-05-18 NOTE — ED Triage Notes (Signed)
Pt has severe swelling and redness to left hand radiating up his left arm. Reports that it could be an insect bite

## 2020-05-18 NOTE — Progress Notes (Signed)
Pharmacy Antibiotic Note  [name] is a 59 y.o. male admitted on 05/18/2020 with sepsis.  Pharmacy has been consulted for vancomycin dosing.  Plan: - Vancomycin 1000 mg IV x1 - Vancomycin 750 mg IV every 12 hours - Monitor clinical status and renal function - Follow-up cultures and de-escalate  Height: 5\' 10"  (177.8 cm) Weight: 68 kg (150 lb) IBW/kg (Calculated) : 73  Temp (24hrs), Avg:98.2 F (36.8 C), Min:98.2 F (36.8 C), Max:98.2 F (36.8 C)  Recent Labs  Lab 05/18/20 1242  WBC 13.7*  CREATININE 1.01  LATICACIDVEN 2.2*    Estimated Creatinine Clearance: 77.6 mL/min (by C-G formula based on SCr of 1.01 mg/dL).    No Known Allergies  Antimicrobials this admission: Cetriaxone 5/25 x1 Vancomcyin 5/25 >>  Microbiology results: 5/25 UCx: sent 5/25 BCx x2: sent     Pearse Shiffler L. 6/25, PharmD [facility] PGY1 Pharmacy Resident [phone] 05/18/20    6:03 PM  Please check AMION for all [facility] Pharmacy phone numbers After 10:00 PM, call the Main Pharmacy [phone]

## 2020-05-19 ENCOUNTER — Observation Stay (HOSPITAL_COMMUNITY): Payer: Medicaid Other

## 2020-05-19 ENCOUNTER — Telehealth (HOSPITAL_COMMUNITY): Payer: Self-pay | Admitting: Licensed Clinical Social Worker

## 2020-05-19 ENCOUNTER — Other Ambulatory Visit: Payer: Self-pay

## 2020-05-19 DIAGNOSIS — L02414 Cutaneous abscess of left upper limb: Secondary | ICD-10-CM | POA: Diagnosis not present

## 2020-05-19 DIAGNOSIS — Z03818 Encounter for observation for suspected exposure to other biological agents ruled out: Secondary | ICD-10-CM | POA: Diagnosis not present

## 2020-05-19 DIAGNOSIS — L03114 Cellulitis of left upper limb: Secondary | ICD-10-CM | POA: Diagnosis present

## 2020-05-19 DIAGNOSIS — Z79899 Other long term (current) drug therapy: Secondary | ICD-10-CM | POA: Diagnosis not present

## 2020-05-19 DIAGNOSIS — K219 Gastro-esophageal reflux disease without esophagitis: Secondary | ICD-10-CM | POA: Diagnosis present

## 2020-05-19 DIAGNOSIS — W19XXXA Unspecified fall, initial encounter: Secondary | ICD-10-CM | POA: Diagnosis present

## 2020-05-19 DIAGNOSIS — M25532 Pain in left wrist: Secondary | ICD-10-CM | POA: Diagnosis not present

## 2020-05-19 DIAGNOSIS — I5022 Chronic systolic (congestive) heart failure: Secondary | ICD-10-CM | POA: Diagnosis not present

## 2020-05-19 DIAGNOSIS — Z951 Presence of aortocoronary bypass graft: Secondary | ICD-10-CM | POA: Diagnosis not present

## 2020-05-19 DIAGNOSIS — E872 Acidosis: Secondary | ICD-10-CM | POA: Diagnosis not present

## 2020-05-19 DIAGNOSIS — M79631 Pain in right forearm: Secondary | ICD-10-CM | POA: Diagnosis not present

## 2020-05-19 DIAGNOSIS — I2510 Atherosclerotic heart disease of native coronary artery without angina pectoris: Secondary | ICD-10-CM | POA: Diagnosis not present

## 2020-05-19 DIAGNOSIS — I255 Ischemic cardiomyopathy: Secondary | ICD-10-CM | POA: Diagnosis present

## 2020-05-19 DIAGNOSIS — G9341 Metabolic encephalopathy: Secondary | ICD-10-CM | POA: Diagnosis not present

## 2020-05-19 DIAGNOSIS — M7989 Other specified soft tissue disorders: Secondary | ICD-10-CM | POA: Diagnosis not present

## 2020-05-19 DIAGNOSIS — E782 Mixed hyperlipidemia: Secondary | ICD-10-CM | POA: Diagnosis not present

## 2020-05-19 DIAGNOSIS — M19042 Primary osteoarthritis, left hand: Secondary | ICD-10-CM | POA: Diagnosis present

## 2020-05-19 DIAGNOSIS — I110 Hypertensive heart disease with heart failure: Secondary | ICD-10-CM | POA: Diagnosis not present

## 2020-05-19 DIAGNOSIS — F10131 Alcohol abuse with withdrawal delirium: Secondary | ICD-10-CM | POA: Diagnosis not present

## 2020-05-19 DIAGNOSIS — Z8674 Personal history of sudden cardiac arrest: Secondary | ICD-10-CM | POA: Diagnosis not present

## 2020-05-19 DIAGNOSIS — G40209 Localization-related (focal) (partial) symptomatic epilepsy and epileptic syndromes with complex partial seizures, not intractable, without status epilepticus: Secondary | ICD-10-CM | POA: Diagnosis not present

## 2020-05-19 DIAGNOSIS — I5042 Chronic combined systolic (congestive) and diastolic (congestive) heart failure: Secondary | ICD-10-CM | POA: Diagnosis not present

## 2020-05-19 DIAGNOSIS — I4901 Ventricular fibrillation: Secondary | ICD-10-CM | POA: Diagnosis not present

## 2020-05-19 DIAGNOSIS — I10 Essential (primary) hypertension: Secondary | ICD-10-CM | POA: Diagnosis not present

## 2020-05-19 DIAGNOSIS — S61419A Laceration without foreign body of unspecified hand, initial encounter: Secondary | ICD-10-CM | POA: Diagnosis present

## 2020-05-19 DIAGNOSIS — M19041 Primary osteoarthritis, right hand: Secondary | ICD-10-CM | POA: Diagnosis present

## 2020-05-19 DIAGNOSIS — F1010 Alcohol abuse, uncomplicated: Secondary | ICD-10-CM | POA: Diagnosis not present

## 2020-05-19 DIAGNOSIS — M79641 Pain in right hand: Secondary | ICD-10-CM | POA: Diagnosis not present

## 2020-05-19 DIAGNOSIS — Z8249 Family history of ischemic heart disease and other diseases of the circulatory system: Secondary | ICD-10-CM | POA: Diagnosis not present

## 2020-05-19 DIAGNOSIS — Z20822 Contact with and (suspected) exposure to covid-19: Secondary | ICD-10-CM | POA: Diagnosis not present

## 2020-05-19 DIAGNOSIS — Z95810 Presence of automatic (implantable) cardiac defibrillator: Secondary | ICD-10-CM | POA: Diagnosis not present

## 2020-05-19 HISTORY — DX: Cellulitis of left upper limb: L03.114

## 2020-05-19 LAB — COMPREHENSIVE METABOLIC PANEL
ALT: 16 U/L (ref 0–44)
AST: 19 U/L (ref 15–41)
Albumin: 2.7 g/dL — ABNORMAL LOW (ref 3.5–5.0)
Alkaline Phosphatase: 50 U/L (ref 38–126)
Anion gap: 13 (ref 5–15)
BUN: 9 mg/dL (ref 6–20)
CO2: 24 mmol/L (ref 22–32)
Calcium: 8.5 mg/dL — ABNORMAL LOW (ref 8.9–10.3)
Chloride: 95 mmol/L — ABNORMAL LOW (ref 98–111)
Creatinine, Ser: 0.78 mg/dL (ref 0.61–1.24)
GFR calc Af Amer: >60 mL/min (ref >60)
GFR calc non Af Amer: >60 mL/min (ref >60)
Glucose, Bld: 126 mg/dL — ABNORMAL HIGH (ref 70–99)
Potassium: 4 mmol/L (ref 3.5–5.1)
Sodium: 132 mmol/L — ABNORMAL LOW (ref 135–145)
Total Bilirubin: 0.9 mg/dL (ref 0.3–1.2)
Total Protein: 6.1 g/dL — ABNORMAL LOW (ref 6.5–8.1)

## 2020-05-19 LAB — CBC WITH DIFFERENTIAL/PLATELET
Abs Immature Granulocytes: 0.1 10*3/uL — ABNORMAL HIGH (ref 0.00–0.07)
Basophils Absolute: 0.1 10*3/uL (ref 0.0–0.1)
Basophils Relative: 0 %
Eosinophils Absolute: 0 10*3/uL (ref 0.0–0.5)
Eosinophils Relative: 0 %
HCT: 41.9 % (ref 39.0–52.0)
Hemoglobin: 14.3 g/dL (ref 13.0–17.0)
Immature Granulocytes: 1 %
Lymphocytes Relative: 7 %
Lymphs Abs: 1 10*3/uL (ref 0.7–4.0)
MCH: 33.4 pg (ref 26.0–34.0)
MCHC: 34.1 g/dL (ref 30.0–36.0)
MCV: 97.9 fL (ref 80.0–100.0)
Monocytes Absolute: 1.8 10*3/uL — ABNORMAL HIGH (ref 0.1–1.0)
Monocytes Relative: 13 %
Neutro Abs: 11.3 10*3/uL — ABNORMAL HIGH (ref 1.7–7.7)
Neutrophils Relative %: 79 %
Platelets: 153 10*3/uL (ref 150–400)
RBC: 4.28 MIL/uL (ref 4.22–5.81)
RDW: 12.2 % (ref 11.5–15.5)
WBC: 14.2 10*3/uL — ABNORMAL HIGH (ref 4.0–10.5)
nRBC: 0 % (ref 0.0–0.2)

## 2020-05-19 LAB — MAGNESIUM: Magnesium: 2.1 mg/dL (ref 1.7–2.4)

## 2020-05-19 LAB — MRSA PCR SCREENING: MRSA by PCR: POSITIVE — AB

## 2020-05-19 MED ORDER — IOHEXOL 300 MG/ML  SOLN
100.0000 mL | Freq: Once | INTRAMUSCULAR | Status: AC | PRN
  Administered 2020-05-19: 100 mL via INTRAVENOUS

## 2020-05-19 MED ORDER — VANCOMYCIN HCL 750 MG/150ML IV SOLN
750.0000 mg | Freq: Two times a day (BID) | INTRAVENOUS | Status: DC
  Administered 2020-05-19 – 2020-05-20 (×2): 750 mg via INTRAVENOUS
  Filled 2020-05-19 (×3): qty 150

## 2020-05-19 NOTE — Telephone Encounter (Signed)
CSW received call from pt landlord informing that [employer] has closed the pts case for rental assistance because they were unable to get a hold of him.  Pts phone has been out of minutes and there were complications with [place] helping him get more minutes added so he has had no way to communicate and is now back in the hospital with medical concerns.  CSW left a message with [employer] to advocate for pt and explain situation so they can reconsider rental assistance for him- left VM  CSW will continue to follow and assist as needed  Burna Sis, LCSW Clinical Social Worker [facility] Desk#: [phone] Cell#: [phone]

## 2020-05-19 NOTE — Consult Note (Signed)
Reason for Consult:Left wrist abscess Referring Physician: C Gurtej [name] is an 59 y.o. male.  HPI: [name] came to the hospital with a 3d hx/o left wrist/hand pain. He denies any preceding factor. It began to spread up his arm and a paramedic friend of his convinced him to come to the hospital where he was admitted. He denies fevers, chills, sweats, N/V, or prior e/o. He notes his symptoms are the same today as they were yesterday when he was admitted. He is RHD.  Past Medical History:  Diagnosis Date  . AICD (automatic cardioverter/defibrillator) present   . Alcohol abuse 05/18/2020  . Arthritis    "hands; maybe my toes" (12/20/2017)  . CAD (coronary artery disease)   . Cardiac arrest (HCC) 11/29/2017   hx VF arrest/notes 12/20/2017  . Chronic systolic heart failure (HCC) 08/25/2019  . Coronary artery disease   . Coronary artery disease involving native coronary artery of native heart without angina pectoris 12/07/2017  . Essential hypertension 05/18/2020  . GERD (gastroesophageal reflux disease)   . H/O ETOH abuse    /notes 12/20/2017  . High cholesterol   . Hypertension   . Ischemic cardiomyopathy    a. 08/2019 s/p BSX D232 single lead AICD.  Marland Kitchen Mixed hyperlipidemia 05/18/2020  . Seizure (HCC) 10/21/2019  . Seizures (HCC) ~ 2016; ~ 2017   "I've had a couple; I was at work" (12/20/2017)  . 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 Syncope and collapse    "last few days" (12/20/2017)    Past Surgical History:  Procedure Laterality Date  . CARDIAC CATHETERIZATION    . CORONARY ARTERY BYPASS GRAFT N/A 12/07/2017   Procedure: CORONARY ARTERY BYPASS GRAFTING (CABG) times four using left internal mammary artery and right saphenous vein using endoscope for harvest.;  Surgeon: [name], MD;  Location: [facility] OR;  Service: Open Heart Surgery;  Laterality: N/A;  . IABP INSERTION N/A 11/29/2017    Procedure: IABP Insertion;  Surgeon: [name], MD;  Location: [facility] INVASIVE CV LAB;  Service: Cardiovascular;  Laterality: N/A;  . IABP INSERTION N/A 12/03/2017   Procedure: IABP INSERTION;  Surgeon: [name], MD;  Location: [facility] INVASIVE CV LAB;  Service: Cardiovascular;  Laterality: N/A;  . ICD IMPLANT N/A 08/29/2019   Procedure: ICD IMPLANT;  Surgeon: Marinus Maw, MD;  Location: [facility] INVASIVE CV LAB;  Service: Cardiovascular;  Laterality: N/A;  . LEFT HEART CATH AND CORONARY ANGIOGRAPHY N/A 11/29/2017   Procedure: LEFT HEART CATH AND CORONARY ANGIOGRAPHY;  Surgeon: [name], MD;  Location: [facility] INVASIVE CV LAB;  Service: Cardiovascular;  Laterality: N/A;  . RIGHT HEART CATH N/A 11/29/2017   Procedure: RIGHT HEART CATH;  Surgeon: [name], MD;  Location: [facility] INVASIVE CV LAB;  Service: Cardiovascular;  Laterality: N/A;  . TEE WITHOUT CARDIOVERSION N/A 12/07/2017   Procedure: TRANSESOPHAGEAL ECHOCARDIOGRAM (TEE);  Surgeon: Donata Clay, Theron Arista, MD;  Location: [facility] OR;  Service: Open Heart Surgery;  Laterality: N/A;  . TONSILLECTOMY AND ADENOIDECTOMY  ~ 1972    Family History  Problem Relation Age of Onset  . Heart attack Other   . Diabetes Neg Hx     Social History:  reports that he has never smoked. He has never used smokeless tobacco. He reports current alcohol use of about 14.0 standard drinks of alcohol per week. He reports that he does not use drugs.  Allergies: No Known Allergies  Medications:  I have reviewed the patient's current medications.  Results for orders placed or performed during the hospital encounter of 05/18/20 (from the past 48 hour(s))  Comprehensive metabolic panel     Status: Abnormal   Collection Time: 05/18/20 12:42 PM  Result Value Ref Range   Sodium 132 (L) 135 - 145 mmol/L   Potassium 5.1 3.5 - 5.1 mmol/L   Chloride 96 (L) 98 - 111 mmol/L   CO2 26 22 - 32 mmol/L   Glucose, Bld 124 (H) 70 - 99 mg/dL    Comment: Glucose reference  range applies only to samples taken after fasting for at least 8 hours.   BUN 7 6 - 20 mg/dL   Creatinine, Ser 0.97 0.61 - 1.24 mg/dL   Calcium 9.2 8.9 - 35.3 mg/dL   Total Protein 7.6 6.5 - 8.1 g/dL   Albumin 3.8 3.5 - 5.0 g/dL   AST 28 15 - 41 U/L   ALT 24 0 - 44 U/L   Alkaline Phosphatase 66 38 - 126 U/L   Total Bilirubin 1.2 0.3 - 1.2 mg/dL   GFR calc non Af Amer >60 >60 mL/min   GFR calc Af Amer >60 >60 mL/min   Anion gap 10 5 - 15    Comment: Performed at [facility], [address], [place], [place] [address]  Lactic acid, plasma     Status: Abnormal   Collection Time: 05/18/20 12:42 PM  Result Value Ref Range   Lactic Acid, Venous 2.2 (HH) 0.5 - 1.9 mmol/L    Comment: CRITICAL RESULT CALLED TO, READ BACK BY AND VERIFIED WITH: KAREN NEWNAM,RN AT 1322 05/18/2020 BY ZBEECH. Performed at [facility], [address] [place], [place] [address]   CBC with Differential     Status: Abnormal   Collection Time: 05/18/20 12:42 PM  Result Value Ref Range   WBC 13.7 (H) 4.0 - 10.5 K/uL   RBC 4.59 4.22 - 5.81 MIL/uL   Hemoglobin 15.6 13.0 - 17.0 g/dL   HCT 19.6 22.2 - 97.9 %   MCV 100.2 (H) 80.0 - 100.0 fL   MCH 34.0 26.0 - 34.0 pg   MCHC 33.9 30.0 - 36.0 g/dL   RDW 89.2 11.9 - 41.7 %   Platelets 169 150 - 400 K/uL   nRBC 0.0 0.0 - 0.2 %   Neutrophils Relative % 84 %   Neutro Abs 11.5 (H) 1.7 - 7.7 K/uL   Lymphocytes Relative 5 %   Lymphs Abs 0.6 (L) 0.7 - 4.0 K/uL   Monocytes Relative 10 %   Monocytes Absolute 1.4 (H) 0.1 - 1.0 K/uL   Eosinophils Relative 0 %   Eosinophils Absolute 0.0 0.0 - 0.5 K/uL   Basophils Relative 0 %   Basophils Absolute 0.0 0.0 - 0.1 K/uL   Immature Granulocytes 1 %   Abs Immature Granulocytes 0.09 (H) 0.00 - 0.07 K/uL    Comment: Performed at [facility], [address], [place], [place] [address]  Protime-INR     Status: None   Collection Time: 05/18/20 12:42 PM  Result Value Ref Range   Prothrombin Time 12.9 11.4 - 15.2  seconds   INR 1.0 0.8 - 1.2    Comment: (NOTE) INR goal varies based on device and disease states. Performed at [facility], [address], [place], [place] [address]   Hemoglobin A1c     Status: Abnormal   Collection Time: 05/18/20 12:42 PM  Result Value  Ref Range   Hgb A1c MFr Bld 5.8 (H) 4.8 - 5.6 %    Comment: (NOTE) Pre diabetes:          5.7%-6.4% Diabetes:              >6.4% Glycemic control for   <7.0% adults with diabetes    Mean Plasma Glucose 119.76 mg/dL    Comment: Performed at [facility], [address] [place], [place] [address]  Lactic acid, plasma     Status: None   Collection Time: 05/18/20  5:40 PM  Result Value Ref Range   Lactic Acid, Venous 1.8 0.5 - 1.9 mmol/L    Comment: Performed at [facility], [address] [place], [place] [address]  SARS Coronavirus 2 by RT PCR (hospital order, performed in [facility] hospital lab) Nasopharyngeal Nasopharyngeal Swab     Status: None   Collection Time: 05/18/20  6:36 PM   Specimen: Nasopharyngeal Swab  Result Value Ref Range   SARS Coronavirus 2 NEGATIVE NEGATIVE    Comment: (NOTE) SARS-CoV-2 target nucleic acids are NOT DETECTED. The SARS-CoV-2 RNA is generally detectable in upper and lower respiratory specimens during the acute phase of infection. The lowest concentration of SARS-CoV-2 viral copies this assay can detect is 250 copies / mL. A negative result does not preclude SARS-CoV-2 infection and should not be used as the sole basis for treatment or other patient management decisions.  A negative result may occur with improper specimen collection / handling, submission of specimen other than nasopharyngeal swab, presence of viral mutation(s) within the areas targeted by this assay, and inadequate number of viral copies (<250 copies / mL). A negative result must be combined with clinical observations, patient history, and epidemiological information. Fact Sheet for Patients:    BoilerBrush.com.cyhttps://www.fda.gov/media/136312/download Fact Sheet for Healthcare Providers: https://pope.com/https://www.fda.gov/media/136313/download This test is not yet approved or cleared  by the [place] FDA and has been authorized for detection and/or diagnosis of SARS-CoV-2 by FDA under an Emergency Use Authorization (EUA).  This EUA will remain in effect (meaning this test can be used) for the duration of the COVID-19 declaration under Section 564(b)(1) of the Act, 21 U.S.C. section 360bbb-3(b)(1), unless the authorization is terminated or revoked sooner. Performed at [facility], [address] [place], [place] [address]   MRSA PCR Screening     Status: Abnormal   Collection Time: 05/19/20  1:00 AM   Specimen: Nasopharyngeal  Result Value Ref Range   MRSA by PCR POSITIVE (A) NEGATIVE    Comment: CRITICAL RESULT CALLED TO, READ BACK BY AND VERIFIED WITH: RN DAVID RHINEHART [id number] @0338  THANEY   CBC WITH DIFFERENTIAL     Status: Abnormal   Collection Time: 05/19/20  3:45 AM  Result Value Ref Range   WBC 14.2 (H) 4.0 - 10.5 K/uL   RBC 4.28 4.22 - 5.81 MIL/uL   Hemoglobin 14.3 13.0 - 17.0 g/dL   HCT 62.141.9 30.839.0 - 65.752.0 %   MCV 97.9 80.0 - 100.0 fL   MCH 33.4 26.0 - 34.0 pg   MCHC 34.1 30.0 - 36.0 g/dL   RDW 84.612.2 96.211.5 - 95.215.5 %   Platelets 153 150 - 400 K/uL   nRBC 0.0 0.0 - 0.2 %   Neutrophils Relative % 79 %   Neutro Abs 11.3 (H) 1.7 - 7.7 K/uL   Lymphocytes Relative 7 %   Lymphs Abs 1.0 0.7 - 4.0 K/uL   Monocytes Relative 13 %  Monocytes Absolute 1.8 (H) 0.1 - 1.0 K/uL   Eosinophils Relative 0 %   Eosinophils Absolute 0.0 0.0 - 0.5 K/uL   Basophils Relative 0 %   Basophils Absolute 0.1 0.0 - 0.1 K/uL   Immature Granulocytes 1 %   Abs Immature Granulocytes 0.10 (H) 0.00 - 0.07 K/uL    Comment: Performed at [facility] [address] [place], [place] [address]  Comprehensive metabolic panel     Status: Abnormal   Collection Time: 05/19/20  3:45 AM  Result Value Ref  Range   Sodium 132 (L) 135 - 145 mmol/L   Potassium 4.0 3.5 - 5.1 mmol/L   Chloride 95 (L) 98 - 111 mmol/L   CO2 24 22 - 32 mmol/L   Glucose, Bld 126 (H) 70 - 99 mg/dL    Comment: Glucose reference range applies only to samples taken after fasting for at least 8 hours.   BUN 9 6 - 20 mg/dL   Creatinine, Ser 0.78 0.61 - 1.24 mg/dL   Calcium 8.5 (L) 8.9 - 10.3 mg/dL   Total Protein 6.1 (L) 6.5 - 8.1 g/dL   Albumin 2.7 (L) 3.5 - 5.0 g/dL   AST 19 15 - 41 U/L   ALT 16 0 - 44 U/L   Alkaline Phosphatase 50 38 - 126 U/L   Total Bilirubin 0.9 0.3 - 1.2 mg/dL   GFR calc non Af Amer >60 >60 mL/min   GFR calc Af Amer >60 >60 mL/min   Anion gap 13 5 - 15    Comment: Performed at [facility] [address] [place], [place] [address]  Magnesium     Status: None   Collection Time: 05/19/20  3:45 AM  Result Value Ref Range   Magnesium 2.1 1.7 - 2.4 mg/dL    Comment: Performed at [facility] [address] [place], [place] [address]    DG Wrist Complete Left  Result Date: 05/18/2020 CLINICAL DATA:  Pt has severe swelling and redness to left hand radiating up his left arm. Reports that it could be an insect bite. Pt had very limited movement. EXAM: LEFT WRIST - COMPLETE 3+ VIEW COMPARISON:  None. FINDINGS: There is a separate bone fragment along the ulnar aspect of the distal ulna which could reflect an old fracture of the ulnar styloid or be developmental, the latter suspected. No acute fracture.  No bone lesion. Wrist joints are normally spaced and aligned. No arthropathic changes. There is diffuse surrounding soft tissue swelling in dense arterial vascular calcifications. IMPRESSION: 1. No acute fracture.  No joint abnormality.  No bone lesion. 2. Diffuse nonspecific soft tissue swelling. Electronically Signed   By: Lajean Manes M.D.   On: 05/18/2020 13:04   CT FOREARM LEFT W CONTRAST  Result Date: 05/19/2020 CLINICAL DATA:  Hand wrist and forearm pain and swelling for several  days. Possible insect bite. EXAM: CT OF THE UPPER LEFT EXTREMITY WITH CONTRAST TECHNIQUE: Multidetector CT imaging of the upper left extremity was performed according to the standard protocol following intravenous contrast administration. CONTRAST:  169mL OMNIPAQUE IOHEXOL 300 MG/ML  SOLN COMPARISON:  Ultrasound 05/18/2020 FINDINGS: Severe diffuse subcutaneous soft tissue swelling/edema/fluid involving the entire hand, wrist and forearm most notably along the dorsal aspect. There is a focal thick walled rim enhancing abscess located on the dorsal aspect of the proximal wrist. This measures approximately 19.5 x 11.5 mm and is located along the dorsal aspect of the distal radius at the  metadiaphyseal junction level. No other discrete abscess is identified. I do not see any definite CT findings for myofasciitis or pyomyositis. No findings suspicious for septic arthritis or osteomyelitis. 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 Remote ununited ulnar styloid fracture. No acute bony findings or bone lesions. IMPRESSION: 1. Severe diffuse subcutaneous soft tissue swelling/edema/fluid involving the entire hand, wrist and forearm most notably along the dorsal aspect of the proximal wrist. 2. 19.5 x 11.5 mm thick walled rim enhancing abscess located along the dorsal aspect of the distal radius at the metadiaphyseal junction level. 3. No definite CT findings for myofasciitis or pyomyositis. 4. No findings suspicious for septic arthritis or osteomyelitis. 5. 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 Electronically Signed   By: Rudie Meyer M.D.   On: 05/19/2020 09:57   CT HAND LEFT W CONTRAST  Result Date: 05/19/2020 CLINICAL DATA:  Hand wrist and forearm pain and swelling for several days. Possible insect bite. EXAM: CT OF THE UPPER LEFT EXTREMITY WITH CONTRAST TECHNIQUE: Multidetector CT imaging of the upper left extremity was performed according to the standard protocol following intravenous contrast administration. CONTRAST:   OMNIPAQUE IOHEXOL 300 MG/ML  SOLN COMPARISON:  Ultrasound 05/18/2020 FINDINGS: Severe diffuse subcutaneous soft tissue swelling/edema/fluid involving the entire hand, wrist and forearm most notably along the dorsal aspect. There is a focal thick walled rim enhancing abscess located on the dorsal aspect of the proximal wrist. This measures approximately 19.5 x 11.5 mm and is located along the dorsal aspect of the distal radius at the metadiaphyseal junction level. No other discrete abscess is identified. I do not see any definite CT findings for myofasciitis or pyomyositis. No findings suspicious for septic arthritis or osteomyelitis. 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 Remote ununited ulnar styloid fracture. No acute bony findings or bone lesions. IMPRESSION: 1. Severe diffuse subcutaneous soft tissue swelling/edema/fluid involving the entire hand, wrist and forearm most notably along the dorsal aspect of the proximal wrist. 2. 19.5 x 11.5 mm thick walled rim enhancing abscess located along the dorsal aspect of the distal radius at the metadiaphyseal junction level. 3. No definite CT findings for myofasciitis or pyomyositis. 4. No findings suspicious for septic arthritis or osteomyelitis. 5. 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 Electronically Signed   By: Rudie Meyer M.D.   On: 05/19/2020 09:57   DG Hand Complete Left  Result Date: 05/18/2020 CLINICAL DATA:  Pt has severe swelling and redness to left hand radiating up his left arm. Reports that it could be an insect bite. Pt had very limited movement EXAM: LEFT HAND - COMPLETE 3+ VIEW COMPARISON:  None. FINDINGS: No acute fracture. No bone lesion. There is joint space narrowing most evident at the second MCP joint. No periarticular erosions. There is diffuse soft tissue swelling that is most evident over the dorsal aspect of the hand extending to the wrist. Dense arterial vascular calcifications are noted. IMPRESSION: 1. No acute fracture.  No  dislocation. 2. Arthropathic changes most evident at the second and third metacarpophalangeal joints, which may reflect an inflammatory arthropathy given the joint involvement. No periarticular erosion is seen, however. 3. Diffuse nonspecific soft tissue swelling most prominent dorsally. Electronically Signed   By: Amie Portland M.D.   On: 05/18/2020 13:03   [place] LT UPPER EXTREM LTD SOFT TISSUE NON VASCULAR  Result Date: 05/18/2020 CLINICAL DATA:  Redness, pain and swelling involving the right hand. EXAM: ULTRASOUND right UPPER EXTREMITY LIMITED TECHNIQUE: Ultrasound examination of the upper extremity soft tissues was performed in the area of  clinical concern. COMPARISON:  Radiographs 05/18/2020 FINDINGS: Extensive dorsal soft tissue swelling/edema/fluid suggesting severe cellulitis. No discrete drainable fluid collection to suggest an abscess. IMPRESSION: Marked inflammation/edema and interstitial fluid throughout the dorsum of the hand but no discrete abscess. Electronically Signed   By: Rudie Meyer M.D.   On: 05/18/2020 19:09    Review of Systems  Constitutional: Positive for fatigue. Negative for chills, diaphoresis and fever.  HENT: Negative for ear discharge, ear pain, hearing loss and tinnitus.   Eyes: Negative for photophobia and pain.  Respiratory: Negative for cough and shortness of breath.   Cardiovascular: Negative for chest pain.  Gastrointestinal: Negative for abdominal pain, nausea and vomiting.  Genitourinary: Negative for dysuria, flank pain, frequency and urgency.  Musculoskeletal: Positive for arthralgias (Left wrist). Negative for back pain, myalgias and neck pain.  Neurological: Negative for dizziness and headaches.  Hematological: Does not bruise/bleed easily.  Psychiatric/Behavioral: The patient is not nervous/anxious.    Blood pressure 115/73, pulse 96, temperature 98.3 F (36.8 C), temperature source Oral, resp. rate 16, height 5\' 10"  (1.778 m), weight 75 kg, SpO2 96  %. Physical Exam  Constitutional: He appears well-developed and well-nourished. No distress.  HENT:  Head: Normocephalic and atraumatic.  Eyes: Conjunctivae are normal. Right eye exhibits no discharge. Left eye exhibits no discharge. No scleral icterus.  Cardiovascular: Normal rate and regular rhythm.  Respiratory: Effort normal. No respiratory distress.  Musculoskeletal:     Cervical back: Normal range of motion.     Comments: UEx shoulder, elbow, wrist, digits- no skin wounds, mild TTP, edema dorsum of hand and wrist, erythema noted proximal to distal radius, no instability, no blocks to motion  Sens  Ax/R/M/U intact  Mot   Ax/ R/ PIN/ M/ AIN/ U intact  Rad 2+  Neurological: He is alert.  Skin: Skin is warm and dry. He is not diaphoretic.  Psychiatric: He has a normal mood and affect. His behavior is normal.    Assessment/Plan: Left upper extremity cellulitis with distal radial abscess -- Given small size of abscess and length of antibiotic treatment I'm inclined to give him another 24h to see if he'll improve. Will make NPO after MN and reassess in AM. If not better Dr. will plan to take to OR tomorrow for I&D. Multiple medical problems including coronary artery disease status post CABG, V. fib arrest status post AICD placement, gastroesophageal reflux disease, hyperlipidemia, hypertension, ischemic cardiomyopathy and systolic congestive heart failure with ejection fraction of 30-35% with diastolic dysfunction based on echo 12/2019, seizure disorder and alcohol abuse -- per primary service    01/2020, PA-C Orthopedic Surgery [phone] 05/19/2020, 11:14 AM

## 2020-05-19 NOTE — Progress Notes (Signed)
PROGRESS NOTE  [name] ZRA:[id number] DOB: 08-Aug-1961 DOA: 05/18/2020 PCP: Claiborne Rigg, NP   LOS: 0 days   Brief Narrative / Interim history: 59 year old male with history of CAD status post CABG, V. 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  He also has been complaining of significant pain in the left wrist, inability to form a full fist as well.  Subjective / 24h Interval events: States that his left hand is very painful this morning, does not appreciate improvement  Assessment & Plan: Principal Problem Left hand, upper extremity cellulitis-he had very quick progression.  Although ultrasound did not show any abscesses, patient underwent a CT scan of the left hand and forearm this morning which showed a rim-enhancing abscess.  Orthopedic surgery consulted, appreciate input.  He was placed on Unasyn along with vancomycin, continue  Active Problems CAD-chest pain-free, continue statin, beta-blocker, aspirin  Chronic combined systolic and diastolic CHF-currently appears euvolemic, no evidence of fluid overload.  Continue Entresto, spironolactone  History of seizures-continue home Keppra  Essential hypertension-continue regimen as below  Hyperlipidemia-continue home medications  Alcohol use-ongoing, placed on CIWA   Scheduled Meds: . aspirin EC  81 mg Oral Daily  . atorvastatin  80 mg Oral q1800  . carvedilol  3.125 mg Oral BID WC  . enoxaparin (LOVENOX) injection  40 mg Subcutaneous QHS  . ezetimibe  10 mg Oral Daily  . folic acid  1 mg Oral Daily  . ivabradine  2.5 mg Oral BID WC  . levETIRAcetam  500 mg Oral BID  . multivitamin with minerals  1 tablet Oral Daily  . sacubitril-valsartan  1 tablet Oral BID  . spironolactone  25 mg Oral Daily  .  thiamine  100 mg Oral Daily   Or  . thiamine  100 mg Intravenous Daily   Continuous Infusions: . ampicillin-sulbactam (UNASYN) IV 3 g (05/19/20 0943)  . vancomycin 750 mg (05/19/20 1041)   PRN Meds:.acetaminophen **OR** acetaminophen, LORazepam **OR** LORazepam, ondansetron **OR** ondansetron (ZOFRAN) IV, oxyCODONE-acetaminophen **OR** oxyCODONE-acetaminophen, polyethylene glycol  DVT prophylaxis: Lovenox Code Status: Full code Family Communication: no family at bedside   Status is: Observation  The patient will require care spanning > 2 midnights and should be moved to inpatient because: Persistent severe cellulitis, orthopedic surgery consult pending, continues to require intravenous antibiotics  Dispo: The patient is from: Home              Anticipated d/c is to: Home              Anticipated d/c date is: 2 days              Patient currently is not medically stable to d/c.  Consultants:  Orthopedic surgery   Procedures:  None   Microbiology  None   Antimicrobials: Vancomycin / Unasyn 5/25 >>    Objective: Vitals:   05/18/20 2115 05/18/20 2130 05/18/20 2225 05/19/20 0541  BP: 116/79 127/85 139/88 115/73  Pulse: 99 99 99 96  Resp:  16 15 16   Temp:  98.2 F (36.8 C) (!) 97.5 F (36.4 C) 98.3 F (36.8 C)  TempSrc:  Oral Oral Oral  SpO2: 98% 98% 99% 96%  Weight:      Height:        Intake/Output Summary (Last 24 hours) at 05/19/2020 1110 Last  data filed at 05/19/2020 0500 Gross per 24 hour  Intake 591.03 ml  Output 1300 ml  Net -708.97 ml   Filed Weights   05/18/20 1245 05/18/20 1949  Weight: 68 kg 75 kg    Examination:  Constitutional: NAD Eyes: no scleral icterus ENMT: Mucous membranes are moist.  Neck: normal, supple Respiratory: clear to auscultation bilaterally, no wheezing, no crackles. Normal respiratory effort. Cardiovascular: Regular rate and rhythm, no murmurs / rubs / gallops. Abdomen: non distended, no tenderness. Bowel sounds positive.   Musculoskeletal: no clubbing / cyanosis.  Skin: Significant swelling left arm, cellulitic changes on the dorsal hand, mid arm Neurologic: Nonfocal, equal strength Psychiatric: Normal judgment and insight. Alert and oriented x 3. Normal mood.   Data Reviewed: I have independently reviewed following labs and imaging studies   CBC: Recent Labs  Lab 05/18/20 1242 05/19/20 0345  WBC 13.7* 14.2*  NEUTROABS 11.5* 11.3*  HGB 15.6 14.3  HCT 46.0 41.9  MCV 100.2* 97.9  PLT 169 153   Basic Metabolic Panel: Recent Labs  Lab 05/18/20 1242 05/19/20 0345  NA 132* 132*  K 5.1 4.0  CL 96* 95*  CO2 26 24  GLUCOSE 124* 126*  BUN 7 9  CREATININE 1.01 0.78  CALCIUM 9.2 8.5*  MG  --  2.1   Liver Function Tests: Recent Labs  Lab 05/18/20 1242 05/19/20 0345  AST 28 19  ALT 24 16  ALKPHOS 66 50  BILITOT 1.2 0.9  PROT 7.6 6.1*  ALBUMIN 3.8 2.7*   Coagulation Profile: Recent Labs  Lab 05/18/20 1242  INR 1.0   HbA1C: Recent Labs    05/18/20 1242  HGBA1C 5.8*   CBG: No results for input(s): GLUCAP in the last 168 hours.  Recent Results (from the past 240 hour(s))  SARS Coronavirus 2 by RT PCR (hospital order, performed in [facility] hospital lab) Nasopharyngeal Nasopharyngeal Swab     Status: None   Collection Time: 05/18/20  6:36 PM   Specimen: Nasopharyngeal Swab  Result Value Ref Range Status   SARS Coronavirus 2 NEGATIVE NEGATIVE Final    Comment: (NOTE) SARS-CoV-2 target nucleic acids are NOT DETECTED. The SARS-CoV-2 RNA is generally detectable in upper and lower respiratory specimens during the acute phase of infection. The lowest concentration of SARS-CoV-2 viral copies this assay can detect is 250 copies / mL. A negative result does not preclude SARS-CoV-2 infection and should not be used as the sole basis for treatment or other patient management decisions.  A negative result may occur with improper specimen collection / handling, submission of specimen  other than nasopharyngeal swab, presence of viral mutation(s) within the areas targeted by this assay, and inadequate number of viral copies (<250 copies / mL). A negative result must be combined with clinical observations, patient history, and epidemiological information. Fact Sheet for Patients:   BoilerBrush.com.cy Fact Sheet for Healthcare Providers: https://pope.com/ This test is not yet approved or cleared  by the [place] FDA and has been authorized for detection and/or diagnosis of SARS-CoV-2 by FDA under an Emergency Use Authorization (EUA).  This EUA will remain in effect (meaning this test can be used) for the duration of the COVID-19 declaration under Section 564(b)(1) of the Act, 21 U.S.C. section 360bbb-3(b)(1), unless the authorization is terminated or revoked sooner. Performed at [facility], [address], [place], [place] [address]   MRSA PCR Screening     Status: Abnormal   Collection Time: 05/19/20  1:00 AM  Specimen: Nasopharyngeal  Result Value Ref Range Status   MRSA by PCR POSITIVE (A) NEGATIVE Final    Comment: CRITICAL RESULT CALLED TO, READ BACK BY AND VERIFIED WITH: RN DAVID Boneta Lucks [phone] @[id number]  [facility]      Radiology Studies: DG Wrist Complete Left  Result Date: 05/18/2020 CLINICAL DATA:  Pt has severe swelling and redness to left hand radiating up his left arm. Reports that it could be an insect bite. Pt had very limited movement. EXAM: LEFT WRIST - COMPLETE 3+ VIEW COMPARISON:  None. FINDINGS: There is a separate bone fragment along the ulnar aspect of the distal ulna which could reflect an old fracture of the ulnar styloid or be developmental, the latter suspected. No acute fracture.  No bone lesion. Wrist joints are normally spaced and aligned. No arthropathic changes. There is diffuse surrounding soft tissue swelling in dense arterial vascular calcifications. IMPRESSION: 1. No acute  fracture.  No joint abnormality.  No bone lesion. 2. Diffuse nonspecific soft tissue swelling. Electronically Signed   By: 05/20/2020 M.D.   On: 05/18/2020 13:04   CT FOREARM LEFT W CONTRAST  Result Date: 05/19/2020 CLINICAL DATA:  Hand wrist and forearm pain and swelling for several days. Possible insect bite. EXAM: CT OF THE UPPER LEFT EXTREMITY WITH CONTRAST TECHNIQUE: Multidetector CT imaging of the upper left extremity was performed according to the standard protocol following intravenous contrast administration. CONTRAST:  05/21/2020 OMNIPAQUE IOHEXOL 300 MG/ML  SOLN COMPARISON:  Ultrasound 05/18/2020 FINDINGS: Severe diffuse subcutaneous soft tissue swelling/edema/fluid involving the entire hand, wrist and forearm most notably along the dorsal aspect. There is a focal thick walled rim enhancing abscess located on the dorsal aspect of the proximal wrist. This measures approximately 19.5 x 11.5 mm and is located along the dorsal aspect of the distal radius at the metadiaphyseal junction level. No other discrete abscess is identified. I do not see any definite CT findings for myofasciitis or pyomyositis. No findings suspicious for septic arthritis or osteomyelitis. 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 Remote ununited ulnar styloid fracture. No acute bony findings or bone lesions. IMPRESSION: 1. Severe diffuse subcutaneous soft tissue swelling/edema/fluid involving the entire hand, wrist and forearm most notably along the dorsal aspect of the proximal wrist. 2. 19.5 x 11.5 mm thick walled rim enhancing abscess located along the dorsal aspect of the distal radius at the metadiaphyseal junction level. 3. No definite CT findings for myofasciitis or pyomyositis. 4. No findings suspicious for septic arthritis or osteomyelitis. 5. 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 Electronically Signed   By: 05/20/2020 M.D.   On: 05/19/2020 09:57   CT HAND LEFT W CONTRAST  Result Date: 05/19/2020 CLINICAL DATA:  Hand  wrist and forearm pain and swelling for several days. Possible insect bite. EXAM: CT OF THE UPPER LEFT EXTREMITY WITH CONTRAST TECHNIQUE: Multidetector CT imaging of the upper left extremity was performed according to the standard protocol following intravenous contrast administration. CONTRAST:  05/21/2020 OMNIPAQUE IOHEXOL 300 MG/ML  SOLN COMPARISON:  Ultrasound 05/18/2020 FINDINGS: Severe diffuse subcutaneous soft tissue swelling/edema/fluid involving the entire hand, wrist and forearm most notably along the dorsal aspect. There is a focal thick walled rim enhancing abscess located on the dorsal aspect of the proximal wrist. This measures approximately 19.5 x 11.5 mm and is located along the dorsal aspect of the distal radius at the metadiaphyseal junction level. No other discrete abscess is identified. I do not see any definite CT findings for myofasciitis or pyomyositis. No findings suspicious for septic  arthritis or osteomyelitis. 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 Remote ununited ulnar styloid fracture. No acute bony findings or bone lesions. IMPRESSION: 1. Severe diffuse subcutaneous soft tissue swelling/edema/fluid involving the entire hand, wrist and forearm most notably along the dorsal aspect of the proximal wrist. 2. 19.5 x 11.5 mm thick walled rim enhancing abscess located along the dorsal aspect of the distal radius at the metadiaphyseal junction level. 3. No definite CT findings for myofasciitis or pyomyositis. 4. No findings suspicious for septic arthritis or osteomyelitis. 5. 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 Electronically Signed   By: Marijo Sanes M.D.   On: 05/19/2020 09:57   DG Hand Complete Left  Result Date: 05/18/2020 CLINICAL DATA:  Pt has severe swelling and redness to left hand radiating up his left arm. Reports that it could be an insect bite. Pt had very limited movement EXAM: LEFT HAND - COMPLETE 3+ VIEW COMPARISON:  None. FINDINGS: No acute fracture. No bone lesion. There is  joint space narrowing most evident at the second MCP joint. No periarticular erosions. There is diffuse soft tissue swelling that is most evident over the dorsal aspect of the hand extending to the wrist. Dense arterial vascular calcifications are noted. IMPRESSION: 1. No acute fracture.  No dislocation. 2. Arthropathic changes most evident at the second and third metacarpophalangeal joints, which may reflect an inflammatory arthropathy given the joint involvement. No periarticular erosion is seen, however. 3. Diffuse nonspecific soft tissue swelling most prominent dorsally. Electronically Signed   By: Lajean Manes M.D.   On: 05/18/2020 13:03   [place] LT UPPER EXTREM LTD SOFT TISSUE NON VASCULAR  Result Date: 05/18/2020 CLINICAL DATA:  Redness, pain and swelling involving the right hand. EXAM: ULTRASOUND right UPPER EXTREMITY LIMITED TECHNIQUE: Ultrasound examination of the upper extremity soft tissues was performed in the area of clinical concern. COMPARISON:  Radiographs 05/18/2020 FINDINGS: Extensive dorsal soft tissue swelling/edema/fluid suggesting severe cellulitis. No discrete drainable fluid collection to suggest an abscess. IMPRESSION: Marked inflammation/edema and interstitial fluid throughout the dorsum of the hand but no discrete abscess. Electronically Signed   By: Marijo Sanes M.D.   On: 05/18/2020 19:09   Marzetta Board, MD, PhD [facility]  Between 7 am - 7 pm I am available, please contact me via Amion or Securechat  Between 7 pm - 7 am I am not available, please contact night coverage MD/APP via Amion

## 2020-05-20 ENCOUNTER — Encounter (HOSPITAL_COMMUNITY)
Admission: EM | Disposition: A | Discharge status: Home / Self Care | Payer: Self-pay | Source: Home / Self Care | Attending: Internal Medicine

## 2020-05-20 ENCOUNTER — Inpatient Hospital Stay (HOSPITAL_COMMUNITY): Payer: Medicaid Other | Admitting: Anesthesiology

## 2020-05-20 ENCOUNTER — Encounter (HOSPITAL_COMMUNITY): Payer: Self-pay | Admitting: Internal Medicine

## 2020-05-20 ENCOUNTER — Telehealth (HOSPITAL_COMMUNITY): Payer: Self-pay | Admitting: Licensed Clinical Social Worker

## 2020-05-20 HISTORY — PX: I & D EXTREMITY: SHX5045

## 2020-05-20 LAB — CBC
HCT: 40.7 % (ref 39.0–52.0)
Hemoglobin: 13.5 g/dL (ref 13.0–17.0)
MCH: 33 pg (ref 26.0–34.0)
MCHC: 33.2 g/dL (ref 30.0–36.0)
MCV: 99.5 fL (ref 80.0–100.0)
Platelets: 159 10*3/uL (ref 150–400)
RBC: 4.09 MIL/uL — ABNORMAL LOW (ref 4.22–5.81)
RDW: 11.9 % (ref 11.5–15.5)
WBC: 10.3 10*3/uL (ref 4.0–10.5)
nRBC: 0 % (ref 0.0–0.2)

## 2020-05-20 LAB — COMPREHENSIVE METABOLIC PANEL
ALT: 19 U/L (ref 0–44)
AST: 26 U/L (ref 15–41)
Albumin: 2.7 g/dL — ABNORMAL LOW (ref 3.5–5.0)
Alkaline Phosphatase: 49 U/L (ref 38–126)
Anion gap: 10 (ref 5–15)
BUN: 9 mg/dL (ref 6–20)
CO2: 22 mmol/L (ref 22–32)
Calcium: 8.2 mg/dL — ABNORMAL LOW (ref 8.9–10.3)
Chloride: 104 mmol/L (ref 98–111)
Creatinine, Ser: 0.75 mg/dL (ref 0.61–1.24)
GFR calc Af Amer: >60 mL/min (ref >60)
GFR calc non Af Amer: >60 mL/min (ref >60)
Glucose, Bld: 104 mg/dL — ABNORMAL HIGH (ref 70–99)
Potassium: 3.9 mmol/L (ref 3.5–5.1)
Sodium: 136 mmol/L (ref 135–145)
Total Bilirubin: 1 mg/dL (ref 0.3–1.2)
Total Protein: 5.9 g/dL — ABNORMAL LOW (ref 6.5–8.1)

## 2020-05-20 LAB — URINE CULTURE

## 2020-05-20 SURGERY — IRRIGATION AND DEBRIDEMENT EXTREMITY
Anesthesia: General | Laterality: Left

## 2020-05-20 MED ORDER — BUPIVACAINE HCL (PF) 0.25 % IJ SOLN
INTRAMUSCULAR | Status: AC
  Filled 2020-05-20: qty 30

## 2020-05-20 MED ORDER — MIDAZOLAM HCL 2 MG/2ML IJ SOLN
INTRAMUSCULAR | Status: AC
  Filled 2020-05-20: qty 2

## 2020-05-20 MED ORDER — CHLORHEXIDINE GLUCONATE 0.12 % MT SOLN: 15.0000 mL | Freq: Once | OROMUCOSAL | Status: AC

## 2020-05-20 MED ORDER — BUPIVACAINE HCL (PF) 0.25 % IJ SOLN
INTRAMUSCULAR | Status: DC | PRN
  Administered 2020-05-20: 6 mL

## 2020-05-20 MED ORDER — CHLORHEXIDINE GLUCONATE 0.12 % MT SOLN
OROMUCOSAL | Status: AC
  Administered 2020-05-20: 15 mL via OROMUCOSAL
  Filled 2020-05-20: qty 15

## 2020-05-20 MED ORDER — 0.9 % SODIUM CHLORIDE (POUR BTL) OPTIME
TOPICAL | Status: DC | PRN
  Administered 2020-05-20: 1000 mL

## 2020-05-20 MED ORDER — VANCOMYCIN HCL IN DEXTROSE 1-5 GM/200ML-% IV SOLN
1000.0000 mg | Freq: Two times a day (BID) | INTRAVENOUS | Status: DC
  Administered 2020-05-20 – 2020-05-22 (×5): 1000 mg via INTRAVENOUS
  Filled 2020-05-20 (×5): qty 200

## 2020-05-20 MED ORDER — SODIUM CHLORIDE 0.9 % IR SOLN
Status: DC | PRN
  Administered 2020-05-20: 3000 mL

## 2020-05-20 MED ORDER — LACTATED RINGERS IV SOLN: INTRAVENOUS | Status: DC | PRN

## 2020-05-20 MED ORDER — LIDOCAINE 2% (20 MG/ML) 5 ML SYRINGE
INTRAMUSCULAR | Status: DC | PRN
  Administered 2020-05-20: 60 mg via INTRAVENOUS

## 2020-05-20 MED ORDER — MUPIROCIN 2 % EX OINT
1.0000 "application " | TOPICAL_OINTMENT | Freq: Two times a day (BID) | CUTANEOUS | Status: AC
  Administered 2020-05-20 – 2020-05-24 (×10): 1 via TOPICAL
  Filled 2020-05-20 (×3): qty 22

## 2020-05-20 MED ORDER — PROPOFOL 10 MG/ML IV BOLUS
INTRAVENOUS | Status: DC | PRN
  Administered 2020-05-20: 100 mg via INTRAVENOUS

## 2020-05-20 MED ORDER — DIPHENHYDRAMINE HCL 25 MG PO CAPS
25.0000 mg | ORAL_CAPSULE | Freq: Four times a day (QID) | ORAL | Status: DC | PRN
  Administered 2020-05-20: 25 mg via ORAL
  Filled 2020-05-20: qty 1

## 2020-05-20 MED ORDER — FENTANYL CITRATE (PF) 100 MCG/2ML IJ SOLN
INTRAMUSCULAR | Status: DC | PRN
  Administered 2020-05-20: 50 ug via INTRAVENOUS

## 2020-05-20 SURGICAL SUPPLY — 52 items
BNDG ELASTIC 4X5.8 VLCR STR LF (GAUZE/BANDAGES/DRESSINGS) ×3 IMPLANT
BNDG ESMARK 4X9 LF (GAUZE/BANDAGES/DRESSINGS) ×3 IMPLANT
BNDG GAUZE ELAST 4 BULKY (GAUZE/BANDAGES/DRESSINGS) ×3 IMPLANT
CHLORAPREP W/TINT 26 (MISCELLANEOUS) ×3 IMPLANT
CORD BIPOLAR FORCEPS 12FT (ELECTRODE) ×3 IMPLANT
COVER SURGICAL LIGHT HANDLE (MISCELLANEOUS) IMPLANT
COVER WAND RF STERILE (DRAPES) ×3 IMPLANT
CUFF TOURN SGL QUICK 18X4 (TOURNIQUET CUFF) ×3 IMPLANT
CUFF TOURN SGL QUICK 24 (TOURNIQUET CUFF) ×2
CUFF TRNQT CYL 24X4X16.5-23 (TOURNIQUET CUFF) ×1 IMPLANT
DRAPE U-SHAPE 47X51 STRL (DRAPES) ×3 IMPLANT
DRSG TRANSPARENT 2.4X2.8 (GAUZE/BANDAGES/DRESSINGS) IMPLANT
GAUZE PACKING IODOFORM 1/4X15 (PACKING) ×3 IMPLANT
GAUZE SPONGE 4X4 12PLY STRL (GAUZE/BANDAGES/DRESSINGS) ×3 IMPLANT
GAUZE XEROFORM 5X9 LF (GAUZE/BANDAGES/DRESSINGS) ×3 IMPLANT
GLOVE BIOGEL PI IND STRL 7.5 (GLOVE) ×3 IMPLANT
GLOVE BIOGEL PI IND STRL 8 (GLOVE) ×1 IMPLANT
GLOVE BIOGEL PI INDICATOR 7.5 (GLOVE) ×6
GLOVE BIOGEL PI INDICATOR 8 (GLOVE) ×2
GLOVE SURG SS PI 6.5 STRL IVOR (GLOVE) ×6 IMPLANT
GLOVE SURG SS PI 7.5 STRL IVOR (GLOVE) ×9 IMPLANT
GLOVE SURG SS PI 8.0 STRL IVOR (GLOVE) ×3 IMPLANT
GLOVE SURG SYN 7.5  E (GLOVE) ×2
GLOVE SURG SYN 7.5 E (GLOVE) ×1 IMPLANT
GOWN STRL REUS W/ TWL LRG LVL3 (GOWN DISPOSABLE) ×2 IMPLANT
GOWN STRL REUS W/ TWL XL LVL3 (GOWN DISPOSABLE) ×3 IMPLANT
GOWN STRL REUS W/TWL LRG LVL3 (GOWN DISPOSABLE) ×4
GOWN STRL REUS W/TWL XL LVL3 (GOWN DISPOSABLE) ×6
KIT BASIN OR (CUSTOM PROCEDURE TRAY) ×3 IMPLANT
KIT TURNOVER KIT B (KITS) ×3 IMPLANT
MANIFOLD NEPTUNE II (INSTRUMENTS) IMPLANT
NEEDLE HYPO 25GX1X1/2 BEV (NEEDLE) ×3 IMPLANT
NS IRRIG 1000ML POUR BTL (IV SOLUTION) ×3 IMPLANT
PACK ORTHO EXTREMITY (CUSTOM PROCEDURE TRAY) ×3 IMPLANT
PAD ARMBOARD 7.5X6 YLW CONV (MISCELLANEOUS) ×3 IMPLANT
PAD CAST 4YDX4 CTTN HI CHSV (CAST SUPPLIES) ×1 IMPLANT
PADDING CAST COTTON 4X4 STRL (CAST SUPPLIES) ×2
SET CYSTO W/LG BORE CLAMP LF (SET/KITS/TRAYS/PACK) ×3 IMPLANT
SPONGE LAP 4X18 RFD (DISPOSABLE) ×3 IMPLANT
SUT PROLENE 4 0 PS 2 18 (SUTURE) ×3 IMPLANT
SWAB CULTURE ESWAB REG 1ML (MISCELLANEOUS) ×3 IMPLANT
SWAB CULTURE LIQ STUART DBL (MISCELLANEOUS) IMPLANT
SWAB CULTURE LIQUID MINI MALE (MISCELLANEOUS) ×3 IMPLANT
SYR CONTROL 10ML LL (SYRINGE) ×3 IMPLANT
TOWEL GREEN STERILE (TOWEL DISPOSABLE) ×3 IMPLANT
TOWEL GREEN STERILE FF (TOWEL DISPOSABLE) ×3 IMPLANT
TUBE CONNECTING 12'X1/4 (SUCTIONS) ×1
TUBE CONNECTING 12X1/4 (SUCTIONS) ×2 IMPLANT
TUBING TUR DISP (UROLOGICAL SUPPLIES) IMPLANT
UNDERPAD 30X36 HEAVY ABSORB (UNDERPADS AND DIAPERS) ×6 IMPLANT
WATER STERILE IRR 1000ML POUR (IV SOLUTION) ×3 IMPLANT
YANKAUER SUCT BULB TIP NO VENT (SUCTIONS) ×3 IMPLANT

## 2020-05-20 NOTE — Progress Notes (Signed)
Left at 1426

## 2020-05-20 NOTE — Progress Notes (Signed)
   Ortho Hand Progress Note  Subjective: No acute events last night. Pain controlled. Swelling, erythema and motion to the hand a digits improved. Still with dorsal wrist swelling.   Objective: Vital signs in last 24 hours: Temp:  [98.3 F (36.8 C)-99.8 F (37.7 C)] 98.3 F (36.8 C) (05/27 0449) Pulse Rate:  [80-91] 80 (05/27 0449) Resp:  [15-16] 15 (05/27 0449) BP: (108-122)/(67-81) 122/81 (05/27 0449) SpO2:  [94 %-97 %] 97 % (05/27 0449)  Intake/Output from previous day: 05/26 0701 - 05/27 0700 In: 1706.7 [P.O.:840; IV Piggyback:866.7] Out: 2275 [Urine:2275] Intake/Output this shift: No intake/output data recorded.  Recent Labs    05/18/20 1242 05/19/20 0345  HGB 15.6 14.3   Recent Labs    05/18/20 1242 05/19/20 0345  WBC 13.7* 14.2*  RBC 4.59 4.28  HCT 46.0 41.9  PLT 169 153   Recent Labs    05/18/20 1242 05/19/20 0345  NA 132* 132*  K 5.1 4.0  CL 96* 95*  CO2 26 24  BUN 7 9  CREATININE 1.01 0.78  GLUCOSE 124* 126*  CALCIUM 9.2 8.5*   Recent Labs    05/18/20 1242  INR 1.0    Aaox3 nad Resp nonlabored RRR LUE: 2x2 cm area over the dorsal, distal radius which is fluctuance, raised and mildly tender. Decreased swelling and erythema to the surrounding hand and wrist. Intact active wrist and digit ROM, improved from yesterday. Fingers wwp with bcr. SILT m/u/r. No other areas of fluctuance noted within the hand or wrist   Assessment/Plan: Left dorsal wrist abscess  - Patient has improved with IV abx but has had coalescence if a dorsal abscess over the distal radius - This abscess has remained unchanged, and if anything, is slightly larger then yesterday - Recommend proceeding forward with I&D of the left dorsal wrist.  -The risks, benefits and alternatives of the procedure were discussed with the patient. These include but are not limited to continued infection, bleeding, damage to surrounding structures including blood vessels and nerves, pain,  stiffness, wound healing complications and need for additional procedures. After having this discussion he did wish to proceed. -Please keep n.p.o. until postop. -Remainder of care per primary   Cain Saupe III 05/20/2020, 7:34 AM  [phone]

## 2020-05-20 NOTE — Progress Notes (Signed)
PROGRESS NOTE  [name] NOM:[id number] DOB: [birth year] DOA: 05/18/2020 PCP: Gildardo Pounds, NP   LOS: 1 day   Brief Narrative / Interim history: 59 year old male with history of CAD status post CABG, V. 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  He also has been complaining of significant pain in the left wrist, inability to form a full fist as well.  Subjective / 24h Interval events: Appreciates less swelling in his left hand but abscess appears still to be there. Orthopedic surgery plans I&D this morning  Assessment & Plan: Principal Problem Left hand, upper extremity cellulitis-he had very quick progression.  Although ultrasound did not show any abscesses, patient underwent a CT scan of the left hand and forearm on 5/26 which showed a rim-enhancing abscess.  Orthopedic surgery consulted, appreciate input, will undergo I&D today.  He was placed on Unasyn along with vancomycin, continue. Hopefully abscess cultures will reveal an organism  Active Problems CAD-chest pain-free, continue statin, beta-blocker, aspirin  Chronic combined systolic and diastolic CHF-currently appears euvolemic, no evidence of fluid overload.  Continue Entresto, spironolactone  History of seizures-continue home Keppra  Essential hypertension-continue regimen as below  Hyperlipidemia-continue home medications  Alcohol use-ongoing, placed on CIWA, does not appear to be withdrawing significantly   Scheduled Meds: . aspirin EC  81 mg Oral Daily  . atorvastatin  80 mg Oral q1800  . carvedilol  3.125 mg Oral BID WC  . enoxaparin (LOVENOX) injection  40 mg Subcutaneous QHS  . ezetimibe  10 mg Oral Daily  . folic acid  1 mg Oral Daily  . ivabradine  2.5 mg Oral BID WC  . levETIRAcetam   500 mg Oral BID  . multivitamin with minerals  1 tablet Oral Daily  . mupirocin ointment  1 application Topical BID  . sacubitril-valsartan  1 tablet Oral BID  . spironolactone  25 mg Oral Daily  . thiamine  100 mg Oral Daily   Or  . thiamine  100 mg Intravenous Daily   Continuous Infusions: . ampicillin-sulbactam (UNASYN) IV 3 g (05/20/20 0954)  . vancomycin 750 mg (05/20/20 0003)   PRN Meds:.acetaminophen **OR** acetaminophen, LORazepam **OR** LORazepam, ondansetron **OR** ondansetron (ZOFRAN) IV, oxyCODONE-acetaminophen **OR** oxyCODONE-acetaminophen, polyethylene glycol  DVT prophylaxis: Lovenox Code Status: Full code Family Communication: no family at bedside   Status is: Inpatient  The patient will require care spanning > 2 midnights and should be moved to inpatient because: Persistent severe cellulitis, orthopedic surgery consult pending, continues to require intravenous antibiotics  Dispo: The patient is from: Home              Anticipated d/c is to: Home              Anticipated d/c date is: 2 days              Patient currently is not medically stable to d/c.  Consultants:  Orthopedic surgery   Procedures:  None   Microbiology  None   Antimicrobials: Vancomycin / Unasyn 5/25 >>    Objective: Vitals:   05/19/20 1306 05/19/20 1955 05/20/20 0449 05/20/20 0743  BP: 108/67 113/68 122/81 122/79  Pulse: 84 91 80 76  Resp: 16 16 15 16   Temp: 98.9 F (37.2 C) 99.8 F (37.7 C) 98.3 F (36.8 C) 98.2 F (36.8 C)  TempSrc: Oral Oral Oral Oral  SpO2: 94% 94% 97% 94%  Weight:      Height:        Intake/Output Summary (Last 24 hours) at 05/20/2020 1030 Last data filed at 05/20/2020 0600 Gross per 24 hour  Intake 1586.69 ml  Output 2275 ml  Net -688.31 ml   Filed Weights   05/18/20 1245 05/18/20 1949  Weight: 68 kg 75 kg    Examination:  Constitutional: No distress Eyes: No icterus ENMT: mmm Neck: normal, supple Respiratory: Clear bilaterally without  wheezing or crackles, normal effort Cardiovascular: Regular rate and rhythm, no murmurs Abdomen: Soft, bowel sounds positive, no distention noted Musculoskeletal: no clubbing / cyanosis.  Skin: Left hand/arm swelling improved, cellulitic changes on the dorsal hand, mid arm-improved Neurologic: Grossly nonfocal  Data Reviewed: I have independently reviewed following labs and imaging studies   CBC: Recent Labs  Lab 05/18/20 1242 05/19/20 0345 05/20/20 0920  WBC 13.7* 14.2* 10.3  NEUTROABS 11.5* 11.3*  --   HGB 15.6 14.3 13.5  HCT 46.0 41.9 40.7  MCV 100.2* 97.9 99.5  PLT 169 153 159   Basic Metabolic Panel: Recent Labs  Lab 05/18/20 1242 05/19/20 0345 05/20/20 0920  NA 132* 132* 136  K 5.1 4.0 3.9  CL 96* 95* 104  CO2 26 24 22   GLUCOSE 124* 126* 104*  BUN 7 9 9   CREATININE 1.01 0.78 0.75  CALCIUM 9.2 8.5* 8.2*  MG  --  2.1  --    Liver Function Tests: Recent Labs  Lab 05/18/20 1242 05/19/20 0345 05/20/20 0920  AST 28 19 26   ALT 24 16 19   ALKPHOS 66 50 49  BILITOT 1.2 0.9 1.0  PROT 7.6 6.1* 5.9*  ALBUMIN 3.8 2.7* 2.7*   Coagulation Profile: Recent Labs  Lab 05/18/20 1242  INR 1.0   HbA1C: Recent Labs    05/18/20 1242  HGBA1C 5.8*   CBG: No results for input(s): GLUCAP in the last 168 hours.  Recent Results (from the past 240 hour(s))  Culture, blood (Routine x 2)     Status: None (Preliminary result)   Collection Time: 05/18/20 12:42 PM   Specimen: BLOOD  Result Value Ref Range Status   Specimen Description BLOOD SITE NOT SPECIFIED  Final   Special Requests   Final    BOTTLES DRAWN AEROBIC ONLY Blood Culture adequate volume   Culture   Final    NO GROWTH 2 DAYS Performed at [facility], [address] [place], 05/20/20 [facility]    Report Status PENDING  Incomplete  Culture, blood (Routine x 2)     Status: None (Preliminary result)   Collection Time: 05/18/20 12:44 PM   Specimen: BLOOD  Result Value Ref Range Status   Specimen  Description BLOOD SITE NOT SPECIFIED  Final   Special Requests   Final    BOTTLES DRAWN AEROBIC AND ANAEROBIC Blood Culture adequate volume   Culture   Final    NO GROWTH 2 DAYS Performed at [facility], [address] [place], 05/20/20 [facility]    Report Status PENDING  Incomplete  Urine culture     Status: Abnormal   Collection Time: 05/18/20  5:59 PM   Specimen: In/Out Cath Urine  Result Value Ref Range Status   Specimen Description IN/OUT CATH URINE  Final   Special Requests   Final    NONE Performed at [facility], [address] [place], 05/20/20 [facility]  Culture MULTIPLE SPECIES PRESENT, SUGGEST RECOLLECTION (A)  Final   Report Status 05/20/2020 FINAL  Final  Blood Culture (routine x 2)     Status: None (Preliminary result)   Collection Time: 05/18/20  6:12 PM   Specimen: BLOOD  Result Value Ref Range Status   Specimen Description BLOOD LEFT ANTECUBITAL  Final   Special Requests   Final    BOTTLES DRAWN AEROBIC AND ANAEROBIC Blood Culture results may not be optimal due to an excessive volume of blood received in culture bottles   Culture   Final    NO GROWTH 2 DAYS Performed at [facility], [address], [place], [place] [address]    Report Status PENDING  Incomplete  SARS Coronavirus 2 by RT PCR (hospital order, performed in [facility] hospital lab) Nasopharyngeal Nasopharyngeal Swab     Status: None   Collection Time: 05/18/20  6:36 PM   Specimen: Nasopharyngeal Swab  Result Value Ref Range Status   SARS Coronavirus 2 NEGATIVE NEGATIVE Final    Comment: (NOTE) SARS-CoV-2 target nucleic acids are NOT DETECTED. The SARS-CoV-2 RNA is generally detectable in upper and lower respiratory specimens during the acute phase of infection. The lowest concentration of SARS-CoV-2 viral copies this assay can detect is 250 copies / mL. A negative result does not preclude SARS-CoV-2 infection and should not be used as the sole basis for treatment or  other patient management decisions.  A negative result may occur with improper specimen collection / handling, submission of specimen other than nasopharyngeal swab, presence of viral mutation(s) within the areas targeted by this assay, and inadequate number of viral copies (<250 copies / mL). A negative result must be combined with clinical observations, patient history, and epidemiological information. Fact Sheet for Patients:   BoilerBrush.com.cy Fact Sheet for Healthcare Providers: https://pope.com/ This test is not yet approved or cleared  by the [place] FDA and has been authorized for detection and/or diagnosis of SARS-CoV-2 by FDA under an Emergency Use Authorization (EUA).  This EUA will remain in effect (meaning this test can be used) for the duration of the COVID-19 declaration under Section 564(b)(1) of the Act, 21 U.S.C. section 360bbb-3(b)(1), unless the authorization is terminated or revoked sooner. Performed at [facility], [address], [place], [place] [address]   MRSA PCR Screening     Status: Abnormal   Collection Time: 05/19/20  1:00 AM   Specimen: Nasopharyngeal  Result Value Ref Range Status   MRSA by PCR POSITIVE (A) NEGATIVE Final    Comment: CRITICAL RESULT CALLED TO, READ BACK BY AND VERIFIED WITH: RN DAVID Boneta Lucks [phone] @[phone]  [facility]      Radiology Studies: No results found. [facility], MD, PhD [facility]  Between 7 am - 7 pm I am available, please contact me via Amion or Securechat  Between 7 pm - 7 am I am not available, please contact night coverage MD/APP via Amion

## 2020-05-20 NOTE — Transfer of Care (Signed)
Immediate Anesthesia Transfer of Care Note  Patient: [name]  Procedure(s) Performed: IRRIGATION AND DEBRIDEMENT OF WRIST (Left )  Patient Location: PACU  Anesthesia Type:General  Level of Consciousness: awake, alert  and oriented  Airway & Oxygen Therapy: Patient Spontanous Breathing and Patient connected to nasal cannula oxygen  Post-op Assessment: Report given to RN, Post -op Vital signs reviewed and stable and Patient moving all extremities X 4  Post vital signs: Reviewed and stable  Last Vitals:  Vitals Value Taken Time  BP 138/95 05/20/20 1431  Temp    Pulse    Resp 18 05/20/20 1432  SpO2    Vitals shown include unvalidated device data.  Last Pain:  Vitals:   05/20/20 0743  TempSrc: Oral  PainSc: Asleep      Patients Stated Pain Goal: 0 (05/19/20 1945)  Complications: No apparent anesthesia complications

## 2020-05-20 NOTE — Progress Notes (Addendum)
Patient has yellow MEWS but is in PACU not 5N. Called PACU and entry error was corrected and patient is green.

## 2020-05-20 NOTE — Anesthesia Procedure Notes (Signed)
Procedure Name: LMA Insertion Date/Time: 05/20/2020 1:50 PM Performed by: Carmela Rima, CRNA Pre-anesthesia Checklist: Timeout performed, Patient being monitored, Suction available, Emergency Drugs available and Patient identified Patient Re-evaluated:Patient Re-evaluated prior to induction Oxygen Delivery Method: Circle system utilized Preoxygenation: Pre-oxygenation with 100% oxygen Induction Type: IV induction Ventilation: Mask ventilation without difficulty LMA: LMA inserted LMA Size: 5.0 Number of attempts: 1 Placement Confirmation: positive ETCO2 and breath sounds checked- equal and bilateral Tube secured with: Tape Dental Injury: Teeth and Oropharynx as per pre-operative assessment

## 2020-05-20 NOTE — Plan of Care (Signed)
  Problem: Education: Goal: Knowledge of General Education information will improve Description: Including pain rating scale, medication(s)/side effects and non-pharmacologic comfort measures Outcome: Progressing   Problem: Pain Managment: Goal: General experience of comfort will improve Outcome: Progressing   Problem: Safety: Goal: Ability to remain free from injury will improve Outcome: Progressing   

## 2020-05-20 NOTE — Op Note (Signed)
PREOPERATIVE DIAGNOSIS: Left dorsal wrist abscess  POSTOPERATIVE DIAGNOSIS: Same  ATTENDING PHYSICIAN: Maudry Mayhew. Jeannie Fend, III, MD who was present and scrubbed for the entire case   ASSISTANT SURGEON: None.   ANESTHESIA: General  SURGICAL PROCEDURES: Irrigation and debridement of left dorsal wrist abscess  SURGICAL INDICATIONS: Patient is a 59 year old male who was admitted to the hospitalist service yesterday.  He was found to have significant cellulitis to the left hand and forearm.  A CT scan was obtained on admission which showed a small 2 x 1 cm abscess over the dorsal, distal radius.  He was placed on IV antibiotics to see if he could improve his symptoms and abscess nonsurgically.  On exam yesterday he had significant swelling and erythema throughout the dorsum of the hand as well as the dorsal forearm.  There is a small fluctuant area along the dorsal distal radius.  On examination this morning, he had increased size and coalescence of this fluctuant area on the dorsum of the forearm.  It was fluctuant and tender and approximately 2 x 2 cm in size.  I had a long discussion with the patient and did recommend proceeding forward with irrigation and debridement of his dorsal wrist abscess and after discussing this he did agree to proceed and presents for that today.  FINDING: There was a abscess and subcutaneous tissue along the dorsal, distal radius.  It was approximately 2 x 2 cm in size.  It was superficial to the extensor retinaculum and tendons.  Successful I&D and packing with iodoform gauze and loose closure was achieved.  DESCRIPTION OF PROCEDURE: Patient was identified in the preoperative holding area where the risk benefits and alternatives of the procedure were discussed with the patient.  These include but are not limited to infection, bleeding, damage to surrounding structures including blood vessels and nerves, pain, stiffness, wound healing complications in need for additional  procedures.  Informed consent was obtained at that time the patient's left forearm was marked with a surgical marking pen.  He was then brought back to the operative suite where timeout was performed identifying the correct patient operative site.  He was positioned supine on the operative table with his hand outstretched on a hand table.  He was induced under general anesthesia.  A tourniquet was placed on the upper arm and the arm was then prepped and draped in usual sterile fashion.  The limb was exsanguinated by gravity and the tourniquet was inflated.  A 3 cm longitudinal incision was made over the dorsal aspect of the wrist.  This was centered around the fluctuant area over the dorsal, distal radius.  Blunt dissection was carried down through the subcutaneous tissues and in doing so there was a blush of purulent material.  This was cultured for both aerobic and anaerobic bacteria.  Continued dissection was then performed through subcutaneous tissues and there was infected appearing subcutaneous tissue throughout the dorsum of the wrist within this area.  It did appear to be superficial to the extensor retinaculum and tendons.  This area was copiously debrided utilizing rondure and curette.  Once all infectious appearing material had been removed the wound was copiously irrigated with approximately a liter and a half normal saline via cystoscopy tubing.  At the completion of this, the wound was loosely closed with 4-0 Prolene simple sutures.  Iodoform packing was placed within the wound in between the sutured areas.  Xeroform, 4 x 4's and a well-padded soft dressing were then applied.  The tourniquet  was released and the patient had return of brisk capillary refill to all of his digits.  He was awoken from his anesthesia and extubated in the operating room without any complications.  He tolerated the procedure well and there are no complications.  ESTIMATED BLOOD LOSS: 10 mL  TOURNIQUET TIME: 13  minutes  SPECIMENS: Aerobic and anaerobic cultures x1  POSTOPERATIVE PLAN: Patient will be transferred back up to the floor for continued postoperative care under the medicine service.  We will follow-up his cultures and plan to pull his packing in approximately 1 to 2 days pending his clinical improvement.  As his pain and wound improves he can increase his range of motion as tolerated.  I will plan to see him back in my clinic in approximately 10 to 14 days postoperatively.  IMPLANTS: None

## 2020-05-20 NOTE — Telephone Encounter (Signed)
CSW received call back from [employer] regarding pt rental assistance application.  They are still reviewing and the case is going to their supervisor because there are concerns that pt does not have a plan to get future rent covered.  CSW provided all additional information that we could to assist- they will continue to review and get back to CSW with a decision shortly.  CSW will continue to follow and assist as needed  Burna Sis, LCSW Clinical Social Worker [facility] Desk#: [phone] Cell#: [phone]

## 2020-05-20 NOTE — Anesthesia Preprocedure Evaluation (Signed)
Anesthesia Evaluation  Patient identified by MRN, date of birth, ID band Patient awake    Reviewed: Allergy & Precautions, H&P , NPO status , Patient's Chart, lab work & pertinent test results, reviewed documented beta blocker date and time   Airway Mallampati: II  TM Distance: >3 FB Neck ROM: Full    Dental no notable dental hx. (+) Poor Dentition, Dental Advisory Given   Pulmonary neg pulmonary ROS,    Pulmonary exam normal breath sounds clear to auscultation       Cardiovascular hypertension, Pt. on medications and Pt. on home beta blockers + CAD and + CABG  + dysrhythmias + Cardiac Defibrillator  Rhythm:Regular Rate:Normal     Neuro/Psych Seizures -, Well Controlled,  negative psych ROS   GI/Hepatic Neg liver ROS, GERD  ,  Endo/Other  negative endocrine ROS  Renal/GU negative Renal ROS  negative genitourinary   Musculoskeletal  (+) Arthritis , Osteoarthritis,    Abdominal   Peds  Hematology negative hematology ROS (+)   Anesthesia Other Findings   Reproductive/Obstetrics negative OB ROS                             Anesthesia Physical Anesthesia Plan  ASA: IV  Anesthesia Plan: General   Post-op Pain Management:    Induction: Intravenous  PONV Risk Score and Plan: 3 and Ondansetron, Dexamethasone and Midazolam  Airway Management Planned: LMA  Additional Equipment:   Intra-op Plan:   Post-operative Plan: Extubation in OR  Informed Consent: I have reviewed the patients History and Physical, chart, labs and discussed the procedure including the risks, benefits and alternatives for the proposed anesthesia with the patient or authorized representative who has indicated his/her understanding and acceptance.     Dental advisory given  Plan Discussed with: CRNA  Anesthesia Plan Comments:         Anesthesia Quick Evaluation

## 2020-05-20 NOTE — Anesthesia Postprocedure Evaluation (Signed)
Anesthesia Post Note  Patient: [name]  Procedure(s) Performed: IRRIGATION AND DEBRIDEMENT OF WRIST (Left )     Patient location during evaluation: PACU Anesthesia Type: General Level of consciousness: awake and alert Pain management: pain level controlled Vital Signs Assessment: post-procedure vital signs reviewed and stable Respiratory status: spontaneous breathing, nonlabored ventilation and respiratory function stable Cardiovascular status: blood pressure returned to baseline and stable Postop Assessment: no apparent nausea or vomiting Anesthetic complications: no    Last Vitals:  Vitals:   05/20/20 1521 05/20/20 1539  BP: 123/83 123/83  Pulse: 68 72  Resp: 18 16  Temp:  (!) 36.3 C  SpO2: 98% 97%    Last Pain:  Vitals:   05/20/20 1539  TempSrc: Oral  PainSc:                  Khamauri Bauernfeind,W. EDMOND

## 2020-05-20 NOTE — Progress Notes (Signed)
Pharmacy Antibiotic Note  [name] is a 59 y.o. male admitted on 05/18/2020 with LUE/hand cellulitis with abscess seen on CT. Ortho consulted for I&D. Pharmacy consulted for Vancomycin dosing along with Unasyn per MD.   Plan: - Increase Vancomycin to 1g IV every 12 hours - Unasyn 3g IV every 6 hours per MD - Will follow-up on cultures per I&D by ortho - Will continue to follow renal function, culture results, LOT, and antibiotic de-escalation plans   Height: 5\' 10"  (177.8 cm) Weight: 75 kg (165 lb 5.5 oz) IBW/kg (Calculated) : 73  Temp (24hrs), Avg:98.8 F (37.1 C), Min:98.2 F (36.8 C), Max:99.8 F (37.7 C)  Recent Labs  Lab 05/18/20 1242 05/18/20 1740 05/19/20 0345 05/20/20 0920  WBC 13.7*  --  14.2* 10.3  CREATININE 1.01  --  0.78 0.75  LATICACIDVEN 2.2* 1.8  --   --     Estimated Creatinine Clearance: 105.2 mL/min (by C-G formula based on SCr of 0.75 mg/dL).    No Known Allergies  Antimicrobials this admission: Cetriaxone 5/25 x1 Vancomcyin 5/25 >> Unasyn 5/25 >>  Microbiology results: 5/25 UCx >> mult species, needs recollect 5/25 BCx x2 >> ngtd  Thank you for allowing pharmacy to be a part of this patient's care.  6/25, PharmD, BCPS Clinical Pharmacist Clinical phone for 05/20/2020: 05/22/2020 05/20/2020 11:01 AM   **Pharmacist phone directory can now be found on [email or web address] ([email or web address]).  Listed under [facility].

## 2020-05-21 LAB — BASIC METABOLIC PANEL
Anion gap: 8 (ref 5–15)
BUN: 9 mg/dL (ref 6–20)
CO2: 23 mmol/L (ref 22–32)
Calcium: 8.4 mg/dL — ABNORMAL LOW (ref 8.9–10.3)
Chloride: 106 mmol/L (ref 98–111)
Creatinine, Ser: 0.82 mg/dL (ref 0.61–1.24)
GFR calc Af Amer: >60 mL/min (ref >60)
GFR calc non Af Amer: >60 mL/min (ref >60)
Glucose, Bld: 118 mg/dL — ABNORMAL HIGH (ref 70–99)
Potassium: 4 mmol/L (ref 3.5–5.1)
Sodium: 137 mmol/L (ref 135–145)

## 2020-05-21 LAB — CBC
HCT: 42.8 % (ref 39.0–52.0)
Hemoglobin: 14.1 g/dL (ref 13.0–17.0)
MCH: 33.6 pg (ref 26.0–34.0)
MCHC: 32.9 g/dL (ref 30.0–36.0)
MCV: 101.9 fL — ABNORMAL HIGH (ref 80.0–100.0)
Platelets: 186 10*3/uL (ref 150–400)
RBC: 4.2 MIL/uL — ABNORMAL LOW (ref 4.22–5.81)
RDW: 11.8 % (ref 11.5–15.5)
WBC: 9.4 10*3/uL (ref 4.0–10.5)
nRBC: 0 % (ref 0.0–0.2)

## 2020-05-21 NOTE — Plan of Care (Signed)
  Problem: Education: Goal: Knowledge of General Education information will improve Description Including pain rating scale, medication(s)/side effects and non-pharmacologic comfort measures Outcome: Progressing   

## 2020-05-21 NOTE — Progress Notes (Signed)
Pt continues to sleep quietly. Vital signs remain stable. Pt on continuous pulse ox and sats remain at 94 or higher. He does open his eyes when his name is called but immediately falls asleep.

## 2020-05-21 NOTE — Progress Notes (Signed)
At 0415 pt attempted to jump out of the bed. He was yelling and claiming he had to "get out of here" that the "boys were stealing his money". He was disoriented and tried to pull away from the staff. He was very unsteady on his feet and required three staff members to protect him. Ativan 4 mg given IV with immediate results. O2 sats 94% on room air.

## 2020-05-21 NOTE — Progress Notes (Signed)
   Ortho Hand Progress Note  Subjective: No acute events last night. Patient had just received a dose of ativan prior to his exam and he was sedated.   Objective: Vital signs in last 24 hours: Temp:  [97.3 F (36.3 C)-98.1 F (36.7 C)] 97.4 F (36.3 C) (05/28 0400) Pulse Rate:  [67-91] 91 (05/28 0835) Resp:  [14-18] 17 (05/28 0835) BP: (100-138)/(68-95) 111/70 (05/28 0835) SpO2:  [93 %-100 %] 93 % (05/28 0835) Weight:  [75 kg] 75 kg (05/27 1404)  Intake/Output from previous day: 05/27 0701 - 05/28 0700 In: 1210 [I.V.:1010; IV Piggyback:200] Out: 1805 [Urine:1800; Blood:5] Intake/Output this shift: No intake/output data recorded.  Recent Labs    05/18/20 1242 05/19/20 0345 05/20/20 0920 05/21/20 0434  HGB 15.6 14.3 13.5 14.1   Recent Labs    05/20/20 0920 05/21/20 0434  WBC 10.3 9.4  RBC 4.09* 4.20*  HCT 40.7 42.8  PLT 159 186   Recent Labs    05/20/20 0920 05/21/20 0434  NA 136 137  K 3.9 4.0  CL 104 106  CO2 22 23  BUN 9 9  CREATININE 0.75 0.82  GLUCOSE 104* 118*  CALCIUM 8.2* 8.4*   Recent Labs    05/18/20 1242  INR 1.0    Sedated, nad Resp nonlabored RRR LUE: Dorsal wrist incision with decreased swelling and edema. Packing pulled. No gross purulence within the wound. Unable to assess motor or sensation secondary to his sedation. Fingers wwp with bcr   Assessment/Plan: Left dorsal wrist abscess s/p I&D L dorsal wrist DOS 5/27  - Patient doing well following I&D yesterday - Cxs: GPCs - Continue dressing changes. - PT wound care for whirlpool while in patinet - Elevate LUE and encourage digit ROM as able - abx per primary  OK for d/c from hand standpoint once abx plan and wound care have been established.   Cain Saupe III 05/21/2020, 11:27 AM  [phone]

## 2020-05-21 NOTE — Progress Notes (Signed)
PROGRESS NOTE  [name] OAC:[id number] DOB: 12-09-1961 DOA: 05/18/2020 PCP: Gildardo Pounds, NP   LOS: 2 days   Brief Narrative / Interim history: 59 year old male with history of CAD status post CABG, V. 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  He also has been complaining of significant pain in the left wrist, inability to form a full fist as well.  Subjective / 24h Interval events: Underwent I&D yesterday. Confused overnight and appears to be withdrawing and triggering CIWA.  No significant complaints this morning but does appear restless, tremulous and confused  Assessment & Plan: Principal Problem Left hand, upper extremity cellulitis-he had very quick progression.  Although ultrasound did not show any abscesses, patient underwent a CT scan of the left hand and forearm on 5/26 which showed a rim-enhancing abscess.  Orthopedic surgery consulted, appreciate input, status post I&D on 5/27.  He was placed on Unasyn along with vancomycin, continue.  Cultures from abscess is growing gram-positive cocci, speciation pending  Active Problems CAD-chest pain-free, continue statin, beta-blocker, aspirin  Chronic combined systolic and diastolic CHF-currently appears euvolemic, no evidence of fluid overload.  Continue Entresto, spironolactone.  No events on telemetry, personally reviewed, discontinued today given restlessness and pulling ambulates  History of seizures-continue home Keppra  Essential hypertension-continue regimen as below  Hyperlipidemia-continue home medications  Alcohol use-did good over the first day however at 36-hour mark started to withdraw more, increasing confusion, continue CIWA.   Scheduled Meds: . aspirin EC  81 mg Oral Daily  . atorvastatin   80 mg Oral q1800  . carvedilol  3.125 mg Oral BID WC  . enoxaparin (LOVENOX) injection  40 mg Subcutaneous QHS  . ezetimibe  10 mg Oral Daily  . folic acid  1 mg Oral Daily  . ivabradine  2.5 mg Oral BID WC  . levETIRAcetam  500 mg Oral BID  . multivitamin with minerals  1 tablet Oral Daily  . mupirocin ointment  1 application Topical BID  . sacubitril-valsartan  1 tablet Oral BID  . spironolactone  25 mg Oral Daily  . thiamine  100 mg Oral Daily   Or  . thiamine  100 mg Intravenous Daily   Continuous Infusions: . ampicillin-sulbactam (UNASYN) IV 3 g (05/21/20 0852)  . vancomycin 1,000 mg (05/21/20 0019)   PRN Meds:.acetaminophen **OR** acetaminophen, diphenhydrAMINE, LORazepam **OR** LORazepam, ondansetron **OR** ondansetron (ZOFRAN) IV, oxyCODONE-acetaminophen **OR** oxyCODONE-acetaminophen, polyethylene glycol  DVT prophylaxis: Lovenox Code Status: Full code Family Communication: no family at bedside   Status is: Inpatient  The patient will require care spanning > 2 midnights and should be moved to inpatient because: Persistent severe cellulitis, orthopedic surgery consult pending, continues to require intravenous antibiotics  Dispo: The patient is from: Home              Anticipated d/c is to: Home              Anticipated d/c date is: 2 days              Patient currently is not medically stable to d/c.  Consultants:  Orthopedic surgery   Procedures:  None   Microbiology  None   Antimicrobials: Vancomycin / Unasyn 5/25 >>   Objective: Vitals:   05/21/20 0500 05/21/20 0600 05/21/20 0657 05/21/20 0835  BP: 100/80 106/85 106/80 111/70  Pulse: 78 84 86 91  Resp:  16 16 17   Temp:      TempSrc:      SpO2:  94% 94% 93%  Weight:      Height:        Intake/Output Summary (Last 24 hours) at 05/21/2020 1035 Last data filed at 05/21/2020 0427 Gross per 24 hour  Intake 1210 ml  Output 1805 ml  Net -595 ml   Filed Weights   05/18/20 1245 05/18/20 1949 05/20/20  1404  Weight: 68 kg 75 kg 75 kg    Examination:  Constitutional: Restless, tremulous Eyes: No icterus seen ENMT: mmm Neck: normal, supple Respiratory: CTA biL, no wheezing Cardiovascular: RRR, no MRG Abdomen: BS+, no tenderness Musculoskeletal: no clubbing / cyanosis.  Skin: Left wrist ACE wrapped Neurologic: non focal, tremulous  Data Reviewed: I have independently reviewed following labs and imaging studies   CBC: Recent Labs  Lab 05/18/20 1242 05/19/20 0345 05/20/20 0920 05/21/20 0434  WBC 13.7* 14.2* 10.3 9.4  NEUTROABS 11.5* 11.3*  --   --   HGB 15.6 14.3 13.5 14.1  HCT 46.0 41.9 40.7 42.8  MCV 100.2* 97.9 99.5 101.9*  PLT 169 153 159 186   Basic Metabolic Panel: Recent Labs  Lab 05/18/20 1242 05/19/20 0345 05/20/20 0920 05/21/20 0434  NA 132* 132* 136 137  K 5.1 4.0 3.9 4.0  CL 96* 95* 104 106  CO2 26 24 22 23   GLUCOSE 124* 126* 104* 118*  BUN 7 9 9 9   CREATININE 1.01 0.78 0.75 0.82  CALCIUM 9.2 8.5* 8.2* 8.4*  MG  --  2.1  --   --    Liver Function Tests: Recent Labs  Lab 05/18/20 1242 05/19/20 0345 05/20/20 0920  AST 28 19 26   ALT 24 16 19   ALKPHOS 66 50 49  BILITOT 1.2 0.9 1.0  PROT 7.6 6.1* 5.9*  ALBUMIN 3.8 2.7* 2.7*   Coagulation Profile: Recent Labs  Lab 05/18/20 1242  INR 1.0   HbA1C: Recent Labs    05/18/20 1242  HGBA1C 5.8*   CBG: No results for input(s): GLUCAP in the last 168 hours.  Recent Results (from the past 240 hour(s))  Culture, blood (Routine x 2)     Status: None (Preliminary result)   Collection Time: 05/18/20 12:42 PM   Specimen: BLOOD  Result Value Ref Range Status   Specimen Description BLOOD SITE NOT SPECIFIED  Final   Special Requests   Final    BOTTLES DRAWN AEROBIC ONLY Blood Culture adequate volume   Culture   Final    NO GROWTH 3 DAYS Performed at [facility] Lab, [address], [place], 05/20/20 05/20/20    Report Status PENDING  Incomplete  Culture, blood (Routine x 2)     Status:  None (Preliminary result)   Collection Time: 05/18/20 12:44 PM   Specimen: BLOOD  Result Value Ref Range Status   Specimen Description BLOOD SITE NOT SPECIFIED  Final   Special Requests   Final    BOTTLES DRAWN AEROBIC AND ANAEROBIC Blood Culture adequate volume   Culture   Final    NO GROWTH 3 DAYS Performed at [facility] Lab, [address] [place], [address] 05/20/20    Report Status PENDING  Incomplete  Urine culture     Status: Abnormal   Collection Time: 05/18/20  5:59 PM   Specimen: In/Out Cath Urine  Result Value Ref Range Status   Specimen Description IN/OUT CATH URINE  Final   Special Requests   Final    NONE Performed at [facility], [address], [place], [place] [address]    Culture MULTIPLE SPECIES PRESENT, SUGGEST RECOLLECTION (A)  Final   Report Status 05/20/2020 FINAL  Final  Blood Culture (routine x 2)     Status: None (Preliminary result)   Collection Time: 05/18/20  6:12 PM   Specimen: BLOOD  Result Value Ref Range Status   Specimen Description BLOOD LEFT ANTECUBITAL  Final   Special Requests   Final    BOTTLES DRAWN AEROBIC AND ANAEROBIC Blood Culture results may not be optimal due to an excessive volume of blood received in culture bottles   Culture   Final    NO GROWTH 3 DAYS Performed at [facility], [address], [place], [place] [address]    Report Status PENDING  Incomplete  SARS Coronavirus 2 by RT PCR (hospital order, performed in [facility] hospital lab) Nasopharyngeal Nasopharyngeal Swab     Status: None   Collection Time: 05/18/20  6:36 PM   Specimen: Nasopharyngeal Swab  Result Value Ref Range Status   SARS Coronavirus 2 NEGATIVE NEGATIVE Final    Comment: (NOTE) SARS-CoV-2 target nucleic acids are NOT DETECTED. The SARS-CoV-2 RNA is generally detectable in upper and lower respiratory specimens during the acute phase of infection. The lowest concentration of SARS-CoV-2 viral copies this assay can detect is 250 copies  / mL. A negative result does not preclude SARS-CoV-2 infection and should not be used as the sole basis for treatment or other patient management decisions.  A negative result may occur with improper specimen collection / handling, submission of specimen other than nasopharyngeal swab, presence of viral mutation(s) within the areas targeted by this assay, and inadequate number of viral copies (<250 copies / mL). A negative result must be combined with clinical observations, patient history, and epidemiological information. Fact Sheet for Patients:   BoilerBrush.com.cy Fact Sheet for Healthcare Providers: https://pope.com/ This test is not yet approved or cleared  by the [place] FDA and has been authorized for detection and/or diagnosis of SARS-CoV-2 by FDA under an Emergency Use Authorization (EUA).  This EUA will remain in effect (meaning this test can be used) for the duration of the COVID-19 declaration under Section 564(b)(1) of the Act, 21 U.S.C. section 360bbb-3(b)(1), unless the authorization is terminated or revoked sooner. Performed at [facility] Lab, [address] [place], [place] [address]   MRSA PCR Screening     Status: Abnormal   Collection Time: 05/19/20  1:00 AM   Specimen: Nasopharyngeal  Result Value Ref Range Status   MRSA by PCR POSITIVE (A) NEGATIVE Final    Comment: CRITICAL RESULT CALLED TO, READ BACK BY AND VERIFIED WITH: RN DAVID Boneta Lucks [phone] @0338  THANEY   Anaerobic culture     Status: None (Preliminary result)   Collection Time: 05/20/20  2:27 PM   Specimen: PATH Other; Tissue  Result Value Ref Range Status   Specimen Description WOUND LEFT WRIST  Final   Special Requests NONE  Final   Gram Stain   Final    RARE WBC PRESENT, PREDOMINANTLY PMN FEW GRAM POSITIVE COCCI Performed at [facility] Lab, [address] [place], [place]    Culture PENDING  Incomplete   Report  Status PENDING  Incomplete  Aerobic Culture (superficial specimen)     Status: None (Preliminary result)   Collection Time: 05/20/20  2:27 PM  Specimen: Wound  Result Value Ref Range Status   Specimen Description WOUND LEFT WRIST  Final   Special Requests NONE  Final   Gram Stain   Final    RARE WBC PRESENT, PREDOMINANTLY PMN FEW GRAM POSITIVE COCCI Performed at [facility], [address] [place], [place] [address]    Culture PENDING  Incomplete   Report Status PENDING  Incomplete     Radiology Studies: No results found. Pamella Pert, MD, PhD [facility]  Between 7 am - 7 pm I am available, please contact me via Amion or Securechat  Between 7 pm - 7 am I am not available, please contact night coverage MD/APP via Amion

## 2020-05-22 LAB — COMPREHENSIVE METABOLIC PANEL
ALT: 23 U/L (ref 0–44)
AST: 27 U/L (ref 15–41)
Albumin: 2.6 g/dL — ABNORMAL LOW (ref 3.5–5.0)
Alkaline Phosphatase: 51 U/L (ref 38–126)
Anion gap: 10 (ref 5–15)
BUN: 6 mg/dL (ref 6–20)
CO2: 24 mmol/L (ref 22–32)
Calcium: 8.6 mg/dL — ABNORMAL LOW (ref 8.9–10.3)
Chloride: 103 mmol/L (ref 98–111)
Creatinine, Ser: 0.72 mg/dL (ref 0.61–1.24)
GFR calc Af Amer: >60 mL/min (ref >60)
GFR calc non Af Amer: >60 mL/min (ref >60)
Glucose, Bld: 97 mg/dL (ref 70–99)
Potassium: 4 mmol/L (ref 3.5–5.1)
Sodium: 137 mmol/L (ref 135–145)
Total Bilirubin: 0.9 mg/dL (ref 0.3–1.2)
Total Protein: 6.2 g/dL — ABNORMAL LOW (ref 6.5–8.1)

## 2020-05-22 LAB — CBC
HCT: 38.3 % — ABNORMAL LOW (ref 39.0–52.0)
Hemoglobin: 12.8 g/dL — ABNORMAL LOW (ref 13.0–17.0)
MCH: 33.1 pg (ref 26.0–34.0)
MCHC: 33.4 g/dL (ref 30.0–36.0)
MCV: 99 fL (ref 80.0–100.0)
Platelets: 186 10*3/uL (ref 150–400)
RBC: 3.87 MIL/uL — ABNORMAL LOW (ref 4.22–5.81)
RDW: 11.7 % (ref 11.5–15.5)
WBC: 7.9 10*3/uL (ref 4.0–10.5)
nRBC: 0 % (ref 0.0–0.2)

## 2020-05-22 LAB — AEROBIC CULTURE W GRAM STAIN (SUPERFICIAL SPECIMEN)

## 2020-05-22 MED ORDER — LORAZEPAM 2 MG/ML IJ SOLN
0.0000 mg | Freq: Four times a day (QID) | INTRAMUSCULAR | Status: AC
  Administered 2020-05-22: 4 mg via INTRAVENOUS
  Administered 2020-05-22 – 2020-05-23 (×3): 2 mg via INTRAVENOUS
  Filled 2020-05-22 (×3): qty 1
  Filled 2020-05-22: qty 2

## 2020-05-22 MED ORDER — LORAZEPAM 2 MG/ML IJ SOLN: 0.0000 mg | Freq: Two times a day (BID) | INTRAMUSCULAR | Status: DC

## 2020-05-22 MED ORDER — LORAZEPAM 1 MG PO TABS
1.0000 mg | ORAL_TABLET | ORAL | Status: AC | PRN
  Administered 2020-05-23 (×2): 2 mg via ORAL
  Administered 2020-05-24: 1 mg via ORAL
  Filled 2020-05-22 (×2): qty 4
  Filled 2020-05-22 (×2): qty 2

## 2020-05-22 MED ORDER — LORAZEPAM 2 MG/ML IJ SOLN
1.0000 mg | INTRAMUSCULAR | Status: AC | PRN
  Administered 2020-05-22 – 2020-05-23 (×5): 4 mg via INTRAVENOUS
  Filled 2020-05-22 (×6): qty 2

## 2020-05-22 MED ORDER — AMOXICILLIN-POT CLAVULANATE 875-125 MG PO TABS
1.0000 | ORAL_TABLET | Freq: Two times a day (BID) | ORAL | Status: DC
  Administered 2020-05-22 – 2020-05-25 (×7): 1 via ORAL
  Filled 2020-05-22 (×7): qty 1

## 2020-05-22 NOTE — Progress Notes (Signed)
PROGRESS NOTE  [name] WUX:[id number] DOB: [birth year] DOA: 05/18/2020 PCP: Gildardo Pounds, NP   LOS: 3 days   Brief Narrative / Interim history: 59 year old male with history of CAD status post CABG, V. 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  He also has been complaining of significant pain in the left wrist, inability to form a full fist as well.  Subjective / 24h Interval events: Continues to withdrawal and has restlessness, intermittent confusion tries to climb out of bed.  Eating breakfast this morning on my evaluation, appears tremulous, no complaints, denies any chest pain, shortness of breath  Assessment & Plan: Principal Problem Left hand, upper extremity cellulitis-he had very quick progression.  Although ultrasound did not show any abscesses, patient underwent a CT scan of the left hand and forearm on 5/26 which showed a rim-enhancing abscess.  Orthopedic surgery consulted, appreciate input, status post I&D on 5/27.  He was placed on Unasyn along with vancomycin, continue.  Cultures from abscess is growing gram-positive cocci which speciated to his group A strep, strep pyogenes.  Convert antibiotics to Augmentin  Active Problems CAD-chest pain-free, continue statin, beta-blocker, aspirin  Chronic combined systolic and diastolic CHF-currently appears euvolemic, no evidence of fluid overload.  Continue Entresto, spironolactone.  No events on telemetry, personally reviewed, discontinued scratch given restlessness and pulling ambulates  History of seizures-continue home Keppra  Essential hypertension-continue regimen as below  Hyperlipidemia-continue home medications  Alcohol use-did good over the first day however at 36-hour mark started to withdraw more,  increasing confusion, continue CIWA.  Continues to require today   Scheduled Meds: . aspirin EC  81 mg Oral Daily  . atorvastatin  80 mg Oral q1800  . carvedilol  3.125 mg Oral BID WC  . enoxaparin (LOVENOX) injection  40 mg Subcutaneous QHS  . ezetimibe  10 mg Oral Daily  . folic acid  1 mg Oral Daily  . ivabradine  2.5 mg Oral BID WC  . levETIRAcetam  500 mg Oral BID  . LORazepam  0-4 mg Intravenous Q6H   Followed by  . [START ON 05/24/2020] LORazepam  0-4 mg Intravenous Q12H  . multivitamin with minerals  1 tablet Oral Daily  . mupirocin ointment  1 application Topical BID  . sacubitril-valsartan  1 tablet Oral BID  . spironolactone  25 mg Oral Daily  . thiamine  100 mg Oral Daily   Or  . thiamine  100 mg Intravenous Daily   Continuous Infusions: . ampicillin-sulbactam (UNASYN) IV 3 g (05/22/20 0836)  . vancomycin 1,000 mg (05/22/20 1141)   PRN Meds:.acetaminophen **OR** acetaminophen, diphenhydrAMINE, LORazepam **OR** LORazepam, ondansetron **OR** ondansetron (ZOFRAN) IV, oxyCODONE-acetaminophen **OR** oxyCODONE-acetaminophen, polyethylene glycol  DVT prophylaxis: Lovenox Code Status: Full code Family Communication: no family at bedside   Status is: Inpatient  The patient will require care spanning > 2 midnights and should be moved to inpatient because: Significant EtOH withdrawal with elevated CIWA scores  Dispo: The patient is from: Home              Anticipated d/c is to: Home              Anticipated d/c date is: 2 days              Patient currently is not medically stable to d/c.  Consultants:  Orthopedic surgery   Procedures:  None   Microbiology  None   Antimicrobials: Vancomycin / Unasyn 5/25 >> 5/29 Augmentin 5/29  Objective: Vitals:   05/21/20 1500 05/21/20 2105 05/22/20 0600 05/22/20 0713  BP: 115/72 112/72 126/78 (!) 117/59  Pulse: 90 94 89 71  Resp: 16 14  12   Temp: 98.2 F (36.8 C) 98.6 F (37 C)  98.1 F (36.7 C)  TempSrc: Oral Oral   Axillary  SpO2: 96% 100%  100%  Weight:      Height:       No intake or output data in the 24 hours ending 05/22/20 1154 Filed Weights   05/18/20 1245 05/18/20 1949 05/20/20 1404  Weight: 68 kg 75 kg 75 kg    Examination:  Constitutional: Restless, tremulous Eyes: No scleral icterus ENMT: Moist mucous membranes Neck: normal, supple Respiratory: Clear bilaterally without wheezing or crackles Cardiovascular: Regular rate and rhythm, no murmurs Abdomen: Soft, nontender, bowel sounds positive Musculoskeletal: no clubbing / cyanosis.  Skin: Left wrist Ace wrap Neurologic: No focal deficits, tremulous  Data Reviewed: I have independently reviewed following labs and imaging studies   CBC: Recent Labs  Lab 05/18/20 1242 05/19/20 0345 05/20/20 0920 05/21/20 0434 05/22/20 0913  WBC 13.7* 14.2* 10.3 9.4 7.9  NEUTROABS 11.5* 11.3*  --   --   --   HGB 15.6 14.3 13.5 14.1 12.8*  HCT 46.0 41.9 40.7 42.8 38.3*  MCV 100.2* 97.9 99.5 101.9* 99.0  PLT 169 153 159 186 186   Basic Metabolic Panel: Recent Labs  Lab 05/18/20 1242 05/19/20 0345 05/20/20 0920 05/21/20 0434 05/22/20 0913  NA 132* 132* 136 137 137  K 5.1 4.0 3.9 4.0 4.0  CL 96* 95* 104 106 103  CO2 26 24 22 23 24   GLUCOSE 124* 126* 104* 118* 97  BUN 7 9 9 9 6   CREATININE 1.01 0.78 0.75 0.82 0.72  CALCIUM 9.2 8.5* 8.2* 8.4* 8.6*  MG  --  2.1  --   --   --    Liver Function Tests: Recent Labs  Lab 05/18/20 1242 05/19/20 0345 05/20/20 0920 05/22/20 0913  AST 28 19 26 27   ALT 24 16 19 23   ALKPHOS 66 50 49 51  BILITOT 1.2 0.9 1.0 0.9  PROT 7.6 6.1* 5.9* 6.2*  ALBUMIN 3.8 2.7* 2.7* 2.6*   Coagulation Profile: Recent Labs  Lab 05/18/20 1242  INR 1.0   HbA1C: No results for input(s): HGBA1C in the last 72 hours. CBG: No results for input(s): GLUCAP in the last 168 hours.  Recent Results (from the past 240 hour(s))  Culture, blood (Routine x 2)     Status: None (Preliminary result)   Collection Time:  05/18/20 12:42 PM   Specimen: BLOOD  Result Value Ref Range Status   Specimen Description BLOOD SITE NOT SPECIFIED  Final   Special Requests   Final    BOTTLES DRAWN AEROBIC ONLY Blood Culture adequate volume   Culture   Final    NO GROWTH 4 DAYS Performed at [facility], [address], [place], 05/20/20 05/20/20    Report Status PENDING  Incomplete  Culture, blood (Routine x 2)     Status: None (Preliminary result)   Collection Time: 05/18/20 12:44 PM   Specimen: BLOOD  Result Value Ref Range Status   Specimen Description BLOOD SITE NOT SPECIFIED  Final   Special Requests   Final    BOTTLES DRAWN AEROBIC AND ANAEROBIC Blood  Culture adequate volume   Culture   Final    NO GROWTH 4 DAYS Performed at [facility], [address], [place], [place] [address]    Report Status PENDING  Incomplete  Urine culture     Status: Abnormal   Collection Time: 05/18/20  5:59 PM   Specimen: In/Out Cath Urine  Result Value Ref Range Status   Specimen Description IN/OUT CATH URINE  Final   Special Requests   Final    NONE Performed at [facility], [address], [place], [place] [address]    Culture MULTIPLE SPECIES PRESENT, SUGGEST RECOLLECTION (A)  Final   Report Status 05/20/2020 FINAL  Final  Blood Culture (routine x 2)     Status: None (Preliminary result)   Collection Time: 05/18/20  6:12 PM   Specimen: BLOOD  Result Value Ref Range Status   Specimen Description BLOOD LEFT ANTECUBITAL  Final   Special Requests   Final    BOTTLES DRAWN AEROBIC AND ANAEROBIC Blood Culture results may not be optimal due to an excessive volume of blood received in culture bottles   Culture   Final    NO GROWTH 4 DAYS Performed at [facility], [address], [place], [place] [address]    Report Status PENDING  Incomplete  SARS Coronavirus 2 by RT PCR (hospital order, performed in [facility] hospital lab) Nasopharyngeal Nasopharyngeal Swab     Status: None   Collection Time:  05/18/20  6:36 PM   Specimen: Nasopharyngeal Swab  Result Value Ref Range Status   SARS Coronavirus 2 NEGATIVE NEGATIVE Final    Comment: (NOTE) SARS-CoV-2 target nucleic acids are NOT DETECTED. The SARS-CoV-2 RNA is generally detectable in upper and lower respiratory specimens during the acute phase of infection. The lowest concentration of SARS-CoV-2 viral copies this assay can detect is 250 copies / mL. A negative result does not preclude SARS-CoV-2 infection and should not be used as the sole basis for treatment or other patient management decisions.  A negative result may occur with improper specimen collection / handling, submission of specimen other than nasopharyngeal swab, presence of viral mutation(s) within the areas targeted by this assay, and inadequate number of viral copies (<250 copies / mL). A negative result must be combined with clinical observations, patient history, and epidemiological information. Fact Sheet for Patients:   BoilerBrush.com.cy Fact Sheet for Healthcare Providers: https://pope.com/ This test is not yet approved or cleared  by the [place] FDA and has been authorized for detection and/or diagnosis of SARS-CoV-2 by FDA under an Emergency Use Authorization (EUA).  This EUA will remain in effect (meaning this test can be used) for the duration of the COVID-19 declaration under Section 564(b)(1) of the Act, 21 U.S.C. section 360bbb-3(b)(1), unless the authorization is terminated or revoked sooner. Performed at [facility], [address], [place], [place] [address]   MRSA PCR Screening     Status: Abnormal   Collection Time: 05/19/20  1:00 AM   Specimen: Nasopharyngeal  Result Value Ref Range Status   MRSA by PCR POSITIVE (A) NEGATIVE Final    Comment: CRITICAL RESULT CALLED TO, READ BACK BY AND VERIFIED WITH: RN DAVID Boneta Lucks [phone] @[id number]  THANEY   Anaerobic culture     Status: None  (Preliminary result)   Collection Time: 05/20/20  2:27 PM   Specimen: PATH Other; Tissue  Result Value Ref Range Status   Specimen Description WOUND LEFT WRIST  Final  Special Requests NONE  Final   Gram Stain   Final    RARE WBC PRESENT, PREDOMINANTLY PMN FEW GRAM POSITIVE COCCI Performed at [facility], [address], [place], [place] [address]    Culture PENDING  Incomplete   Report Status PENDING  Incomplete  Aerobic Culture (superficial specimen)     Status: None   Collection Time: 05/20/20  2:27 PM   Specimen: Wound  Result Value Ref Range Status   Specimen Description WOUND LEFT WRIST  Final   Special Requests NONE  Final   Gram Stain   Final    RARE WBC PRESENT, PREDOMINANTLY PMN FEW GRAM POSITIVE COCCI    Culture   Final    FEW GROUP A STREP (S.PYOGENES) ISOLATED Beta hemolytic streptococci are predictably susceptible to penicillin and other beta lactams. Susceptibility testing not routinely performed. Performed at [facility] Lab, [address], [place], [place] [address]    Report Status 05/22/2020 FINAL  Final     Radiology Studies: No results found. Pamella Pert, MD, PhD [facility]  Between 7 am - 7 pm I am available, please contact me via Amion or Securechat  Between 7 pm - 7 am I am not available, please contact night coverage MD/APP via Amion

## 2020-05-22 NOTE — Progress Notes (Signed)
PT/Hydrotherapy Cancellation Note  Patient Details Name: [name] MRN: [id number] DOB: May 07, 1961   Cancelled Treatment:    Reason Eval/Treat Not Completed: Medical issues which prohibited therapy  Spoke with RN and pt is on CIWA protocol and is agitated (pulling IVs out, trying to climb OOB) and not appropriate to attempt wound care at this time.   Provided my pager number to RN in case pt is able to be seen later today.    Jerolyn Center, PT Pager [phone]  Zena Amos 05/22/2020, 8:17 AM

## 2020-05-22 NOTE — Plan of Care (Signed)
  Problem: Safety: Goal: Ability to remain free from injury will improve Outcome: Progressing   Problem: Education: Goal: Knowledge of General Education information will improve Description: Including pain rating scale, medication(s)/side effects and non-pharmacologic comfort measures Outcome: Not Progressing   Problem: Coping: Goal: Level of anxiety will decrease Outcome: Not Progressing

## 2020-05-23 LAB — COMPREHENSIVE METABOLIC PANEL
ALT: 27 U/L (ref 0–44)
AST: 30 U/L (ref 15–41)
Albumin: 2.9 g/dL — ABNORMAL LOW (ref 3.5–5.0)
Alkaline Phosphatase: 55 U/L (ref 38–126)
Anion gap: 10 (ref 5–15)
BUN: 9 mg/dL (ref 6–20)
CO2: 24 mmol/L (ref 22–32)
Calcium: 9 mg/dL (ref 8.9–10.3)
Chloride: 104 mmol/L (ref 98–111)
Creatinine, Ser: 0.97 mg/dL (ref 0.61–1.24)
GFR calc Af Amer: >60 mL/min (ref >60)
GFR calc non Af Amer: >60 mL/min (ref >60)
Glucose, Bld: 106 mg/dL — ABNORMAL HIGH (ref 70–99)
Potassium: 4.1 mmol/L (ref 3.5–5.1)
Sodium: 138 mmol/L (ref 135–145)
Total Bilirubin: 0.7 mg/dL (ref 0.3–1.2)
Total Protein: 6.9 g/dL (ref 6.5–8.1)

## 2020-05-23 LAB — CBC
HCT: 39.1 % (ref 39.0–52.0)
Hemoglobin: 13.2 g/dL (ref 13.0–17.0)
MCH: 32.9 pg (ref 26.0–34.0)
MCHC: 33.8 g/dL (ref 30.0–36.0)
MCV: 97.5 fL (ref 80.0–100.0)
Platelets: 255 10*3/uL (ref 150–400)
RBC: 4.01 MIL/uL — ABNORMAL LOW (ref 4.22–5.81)
RDW: 11.4 % — ABNORMAL LOW (ref 11.5–15.5)
WBC: 9.7 10*3/uL (ref 4.0–10.5)
nRBC: 0 % (ref 0.0–0.2)

## 2020-05-23 LAB — CULTURE, BLOOD (ROUTINE X 2)
Culture: NO GROWTH
Culture: NO GROWTH
Culture: NO GROWTH
Special Requests: ADEQUATE
Special Requests: ADEQUATE

## 2020-05-23 LAB — HEPATITIS C ANTIBODY: HCV Ab: NONREACTIVE

## 2020-05-23 LAB — HIV ANTIBODY (ROUTINE TESTING W REFLEX): HIV Screen 4th Generation wRfx: NONREACTIVE

## 2020-05-23 MED ORDER — HALOPERIDOL LACTATE 5 MG/ML IJ SOLN
2.0000 mg | Freq: Once | INTRAMUSCULAR | Status: AC
  Administered 2020-05-23: 2 mg via INTRAVENOUS
  Filled 2020-05-23: qty 1

## 2020-05-23 NOTE — Progress Notes (Signed)
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  He also has been complaining of significant pain in the left wrist, inability to form a full fist as well.  Subjective / 24h Interval events: Remains confused however slightly better than yesterday.  Tells me he wants to go home but cannot tell me where he is or what is the  Assessment & Plan: Principal Problem Left hand, upper extremity cellulitis-he had very quick progression.  Although ultrasound did not show any abscesses, patient underwent a CT scan of the left hand and forearm on 5/26 which showed a rim-enhancing abscess.  Orthopedic surgery consulted, appreciate input, status post I&D on 5/27.  He was placed on Unasyn along with vancomycin, continue.  Cultures from abscess is growing gram-positive cocci which speciated to his group A strep, strep pyogenes.  Convert antibiotics to Augmentin -Daily dressing changes  Active Problems CAD-chest pain-free, continue statin, beta-blocker, aspirin  Chronic combined systolic and diastolic CHF-currently appears euvolemic, no evidence of fluid overload.  Continue Entresto, spironolactone.  No events on telemetry, personally reviewed, discontinued 5/29 given restlessness and pulling ambulates  History of seizures-continue home Keppra  Essential hypertension-continue regimen as below  Hyperlipidemia-continue home medications  Alcohol use with withdrawal with delirium-did good over the first day however at 36-hour mark started to withdraw more, has persistent confusion but is  slowly improving and appears less restless today than he was yesterday   Scheduled Meds: . amoxicillin-clavulanate  1 tablet Oral Q12H  . aspirin EC  81 mg Oral Daily  . atorvastatin  80 mg Oral q1800  . carvedilol  3.125 mg Oral BID WC  . enoxaparin (LOVENOX) injection  40 mg Subcutaneous QHS  . ezetimibe  10 mg Oral Daily  . folic acid  1 mg Oral Daily  . ivabradine  2.5 mg Oral BID WC  . levETIRAcetam  500 mg Oral BID  . LORazepam  0-4 mg Intravenous Q6H   Followed by  . [START ON 05/24/2020] LORazepam  0-4 mg Intravenous Q12H  . multivitamin with minerals  1 tablet Oral Daily  . mupirocin ointment  1 application Topical BID  . sacubitril-valsartan  1 tablet Oral BID  . spironolactone  25 mg Oral Daily  . thiamine  100 mg Oral Daily   Or  . thiamine  100 mg Intravenous Daily   Continuous Infusions:  PRN Meds:.acetaminophen **OR** acetaminophen, diphenhydrAMINE, LORazepam **OR** LORazepam, ondansetron **OR** ondansetron (ZOFRAN) IV, oxyCODONE-acetaminophen **OR** oxyCODONE-acetaminophen, polyethylene glycol  DVT prophylaxis: Lovenox Code Status: Full code Family Communication: no family at bedside, discussed with sister over the phone 5/29  Status is: Inpatient  Remains inpatient due to ongoing encephalopathy  Dispo: The patient is from: Home              Anticipated d/c is to: Home              Anticipated d/c date is: 2 days              Patient currently is not medically stable to d/c.  Consultants:  Orthopedic surgery   Procedures:  I&D 5/27  Microbiology  None   Antimicrobials: Vancomycin / Unasyn 5/25 >> 5/29 Augmentin 5/29  Objective: Vitals:   05/22/20 0713 05/22/20 2014 05/23/20 0433 05/23/20 0829  BP: (!) 117/59 128/72 115/71 104/76  Pulse: 71 81 72 84  Resp: 12 18 16 18   Temp: 98.1 F (36.7 C) 98.3 F (36.8 C) 98.4 F (36.9 C) 97.7 F (36.5 C)  TempSrc: Axillary Axillary Axillary Oral  SpO2: 100% 98% 100% 96%  Weight:      Height:         Intake/Output Summary (Last 24 hours) at 05/23/2020 1000 Last data filed at 05/23/2020 0300 Gross per 24 hour  Intake --  Output 2250 ml  Net -2250 ml   Filed Weights   05/18/20 1245 05/18/20 1949 05/20/20 1404  Weight: 68 kg 75 kg 75 kg    Examination:  Constitutional: Restless Eyes: No icterus seen ENMT: Moist mucous membranes Neck: normal, supple Respiratory: Clear bilaterally, no wheezing, no crackles Cardiovascular: Regular rate and rhythm, no murmurs appreciated Abdomen: Soft, nontender, nondistended, positive bowel sounds Musculoskeletal: no clubbing / cyanosis.  Skin: Left wrist Ace wrap Neurologic: Nonfocal, tremulous  Data Reviewed: I have independently reviewed following labs and imaging studies   CBC: Recent Labs  Lab 05/18/20 1242 05/19/20 0345 05/20/20 0920 05/21/20 0434 05/22/20 0913  WBC 13.7* 14.2* 10.3 9.4 7.9  NEUTROABS 11.5* 11.3*  --   --   --   HGB 15.6 14.3 13.5 14.1 12.8*  HCT 46.0 41.9 40.7 42.8 38.3*  MCV 100.2* 97.9 99.5 101.9* 99.0  PLT 169 153 159 186 186   Basic Metabolic Panel: Recent Labs  Lab 05/18/20 1242 05/19/20 0345 05/20/20 0920 05/21/20 0434 05/22/20 0913  NA 132* 132* 136 137 137  K 5.1 4.0 3.9 4.0 4.0  CL 96* 95* 104 106 103  CO2 26 24 22 23 24   GLUCOSE 124* 126* 104* 118* 97  BUN 7 9 9 9 6   CREATININE 1.01 0.78 0.75 0.82 0.72  CALCIUM 9.2 8.5* 8.2* 8.4* 8.6*  MG  --  2.1  --   --   --    Liver Function Tests: Recent Labs  Lab 05/18/20 1242 05/19/20 0345 05/20/20 0920 05/22/20 0913  AST 28 19 26 27   ALT 24 16 19 23   ALKPHOS 66 50 49 51  BILITOT 1.2 0.9 1.0 0.9  PROT 7.6 6.1* 5.9* 6.2*  ALBUMIN 3.8 2.7* 2.7* 2.6*   Coagulation Profile: Recent Labs  Lab 05/18/20 1242  INR 1.0   HbA1C: No results for input(s): HGBA1C in the last 72 hours. CBG: No results for input(s): GLUCAP in the last 168 hours.  Recent Results (from the past 240 hour(s))  Culture, blood (Routine x 2)     Status: None    Collection Time: 05/18/20 12:42 PM   Specimen: BLOOD  Result Value Ref Range Status   Specimen Description BLOOD SITE NOT SPECIFIED  Final   Special Requests   Final    BOTTLES DRAWN AEROBIC ONLY Blood Culture adequate volume   Culture   Final    NO GROWTH 5 DAYS Performed at [facility], [address], [place], 05/20/20 05/20/20    Report Status 05/23/2020 FINAL  Final  Culture, blood (Routine x 2)     Status: None   Collection Time: 05/18/20 12:44 PM   Specimen: BLOOD  Result Value Ref Range Status   Specimen Description BLOOD SITE NOT SPECIFIED  Final   Special Requests   Final  BOTTLES DRAWN AEROBIC AND ANAEROBIC Blood Culture adequate volume   Culture   Final    NO GROWTH 5 DAYS Performed at [facility], [place] [address], [place], [place] [address]    Report Status 05/23/2020 FINAL  Final  Urine culture     Status: Abnormal   Collection Time: 05/18/20  5:59 PM   Specimen: In/Out Cath Urine  Result Value Ref Range Status   Specimen Description IN/OUT CATH URINE  Final   Special Requests   Final    NONE Performed at [facility], [place] [address], [place], [place] [address]    Culture MULTIPLE SPECIES PRESENT, SUGGEST RECOLLECTION (A)  Final   Report Status 05/20/2020 FINAL  Final  Blood Culture (routine x 2)     Status: None   Collection Time: 05/18/20  6:12 PM   Specimen: BLOOD  Result Value Ref Range Status   Specimen Description BLOOD LEFT ANTECUBITAL  Final   Special Requests   Final    BOTTLES DRAWN AEROBIC AND ANAEROBIC Blood Culture results may not be optimal due to an excessive volume of blood received in culture bottles   Culture   Final    NO GROWTH 5 DAYS Performed at [facility], [place] [address], [place], [place] [address]    Report Status 05/23/2020 FINAL  Final  SARS Coronavirus 2 by RT PCR (hospital order, performed in [facility] hospital lab) Nasopharyngeal Nasopharyngeal Swab     Status: None   Collection Time: 05/18/20   6:36 PM   Specimen: Nasopharyngeal Swab  Result Value Ref Range Status   SARS Coronavirus 2 NEGATIVE NEGATIVE Final    Comment: (NOTE) SARS-CoV-2 target nucleic acids are NOT DETECTED. The SARS-CoV-2 RNA is generally detectable in upper and lower respiratory specimens during the acute phase of infection. The lowest concentration of SARS-CoV-2 viral copies this assay can detect is 250 copies / mL. A negative result does not preclude SARS-CoV-2 infection and should not be used as the sole basis for treatment or other patient management decisions.  A negative result may occur with improper specimen collection / handling, submission of specimen other than nasopharyngeal swab, presence of viral mutation(s) within the areas targeted by this assay, and inadequate number of viral copies (<250 copies / mL). A negative result must be combined with clinical observations, patient history, and epidemiological information. Fact Sheet for Patients:   StrictlyIdeas.no Fact Sheet for Healthcare Providers: BankingDealers.co.za This test is not yet approved or cleared  by the [place] FDA and has been authorized for detection and/or diagnosis of SARS-CoV-2 by FDA under an Emergency Use Authorization (EUA).  This EUA will remain in effect (meaning this test can be used) for the duration of the COVID-19 declaration under Section 564(b)(1) of the Act, 21 U.S.C. section 360bbb-3(b)(1), unless the authorization is terminated or revoked sooner. Performed at [facility], [place] [address], [place], [place] [address]   MRSA PCR Screening     Status: Abnormal   Collection Time: 05/19/20  1:00 AM   Specimen: Nasopharyngeal  Result Value Ref Range Status   MRSA by PCR POSITIVE (A) NEGATIVE Final    Comment: CRITICAL RESULT CALLED TO, READ BACK BY AND VERIFIED WITH: RN DAVID RHINEHART [phone] @[id number]  THANEY   Anaerobic culture     Status: None  (Preliminary result)   Collection Time: 05/20/20  2:27 PM   Specimen: PATH Other; Tissue  Result Value Ref Range Status   Specimen Description  WOUND LEFT WRIST  Final   Special Requests NONE  Final   Gram Stain   Final    RARE WBC PRESENT, PREDOMINANTLY PMN FEW GRAM POSITIVE COCCI Performed at [facility], [address], [place], [place] [address]    Culture   Final    NO ANAEROBES ISOLATED; CULTURE IN PROGRESS FOR 5 DAYS   Report Status PENDING  Incomplete  Aerobic Culture (superficial specimen)     Status: None   Collection Time: 05/20/20  2:27 PM   Specimen: Wound  Result Value Ref Range Status   Specimen Description WOUND LEFT WRIST  Final   Special Requests NONE  Final   Gram Stain   Final    RARE WBC PRESENT, PREDOMINANTLY PMN FEW GRAM POSITIVE COCCI    Culture   Final    FEW GROUP A STREP (S.PYOGENES) ISOLATED Beta hemolytic streptococci are predictably susceptible to penicillin and other beta lactams. Susceptibility testing not routinely performed. Performed at [facility], [address], [place], [place] [address]    Report Status 05/22/2020 FINAL  Final     Radiology Studies: No results found. Pamella Pert, MD, PhD [facility]  Between 7 am - 7 pm I am available, please contact me via Amion or Securechat  Between 7 pm - 7 am I am not available, please contact night coverage MD/APP via Amion

## 2020-05-23 NOTE — Progress Notes (Signed)
Physical Therapy Wound Evaluation, Treatment, and Discharge Patient Details  Name: [name] MRN: [id number] Date of Birth: Jan 13, 1961  Today's Date: 05/23/2020 Time: 0912-0937 Time Calculation (min): 25 min  Subjective  Subjective: "I live right here." pt currently hospitalized Patient and Family Stated Goals: none stated; very sedated from ativan Date of Onset: (unknown) Prior Treatments: s/p I&D 5/27  Pain Score: Pain Score: 0-No pain  Clinical Statement: Wound well approximated with dried blood/scabbing at suture sites. Unable to express any drainage from wound. Mild edema and instructed in hand ROM, wrist ROM (although pt very groggy from meds). No specifid wound care orders and therefore applied the dressing indicated in op note (xeroform, moist saline 4x4's, dry saline 4x4's, kling wrap, 4" ace wrap. Patient's wound no longer requires hydrotherapy as edges are approximated. Dr. Cruzita Lederer made aware; message left for Dr. Jeannie Fend.   Wound Assessment  Incision (Closed) 05/20/20 Hand Left (Active)  Dressing Type Impregnated gauze (bismuth);Gauze (Comment);Moist to moist;Compression wrap 05/23/20 0951  Dressing Clean;Dry;Intact;Changed 05/23/20 0951  Dressing Change Frequency Other (Comment) 05/23/20 0951  Site / Wound Assessment Dry 05/23/20 0951  Incision Length (cm) 4.8 cm 05/23/20 0951  Margins Attached edges (approximated) 05/23/20 0951  Closure Sutures 05/23/20 0951  Drainage Amount Minimal 05/23/20 0951  Drainage Description Serosanguineous;No odor 05/23/20 0951  Treatment Cleansed;Other (Comment) 05/23/20 0951   After pulse lavage, removed as much of dried blood, scabbed areas as possible.      Wound Assessment and Plan  Wound Therapy - Assess/Plan/Recommendations Wound Therapy - Clinical Statement: Wound well approximated with dried blood/scabbing at suture sites. Unable to express any drainage from wound. Mild edema and instructed in hand ROM, wrist ROM  (although pt very groggy from meds). No specifid wound care orders and therefore applied the dressing indicated in op note (xeroform, moist saline 4x4's, dry saline 4x4's, kling wrap, 4" ace wrap. Patient's wound no longer requires hydrotherapy as edges are approximated. Dr. Cruzita Lederer made aware.  Wound Therapy - Functional Problem List: decreased Hand/wrist ROM due to mild edema and pain Factors Delaying/Impairing Wound Healing: Infection - systemic/local;Immobility;Multiple medical problems;Substance abuse Hydrotherapy Plan: Other (comment)(discharge from hydrotherapy) Wound Therapy - Follow Up Recommendations: Home health RN;Other (comment)(vs family assist vs ortho office) Wound Plan: Discharge from hydrotherapy. Dr. Cruzita Lederer notified; message left for Dr. Jeannie Fend.   Wound Therapy Goals- Improve the function of patient's integumentary system by progressing the wound(s) through the phases of wound healing (inflammation - proliferation - remodeling) by:    Goals will be updated until maximal potential achieved or discharge criteria met.  Discharge criteria: when goals achieved, discharge from hospital, MD decision/surgical intervention, no progress towards goals, refusal/missing three consecutive treatments without notification or medical reason.  GP      Arby Barrette, PT Pager [phone]  Jeanie Cooks Leif Loflin 05/23/2020, 10:08 AM

## 2020-05-23 NOTE — Progress Notes (Signed)
PT Cancellation Note  Patient Details Name: Danel Requena MRN: [id number] DOB: [birth year]   Cancelled Treatment:    Reason Eval/Treat Not Completed: Other (comment).  Pt is sedated and will try to evaluate mobility as time as time and pt allow, at another time.   Ivar Drape 05/23/2020, 10:54 AM   Samul Dada, PT MS Acute Rehab Dept. Number: [facility] [id number] and [facility] [phone]

## 2020-05-23 NOTE — Evaluation (Signed)
Physical Therapy Evaluation Patient Details Name: [name] MRN: [id number] DOB: [birth year] Today's Date: 05/23/2020   History of Present Illness  59 yo male with cellulitis on L arm was admitted for medical care, after being encouraged by EMT's for trip to ED.  Pt is demonstrating cellulitis on LUE, 10/10 level pain, with lactic acidosis and leukocytosis, referred to PT.  PMHx:  AICD, EtOH abuse, OA, CAD, MI with arrest, HTN, CAD, ischemic cardiomyopathy, syncope, seizures,   Clinical Impression  Pt was seen for mobility on RW with support to RUE and touch on LUE.  Pt is not restricted to WB on LUE but is not able to functionally use it.  May benefit from platform but currently after having ativan to manage his agitation, would be likely to need nothing at all next visit.  Will follow acutely to see how his safety and balance skills present once he is less medicated.    Follow Up Recommendations Home health PT;Supervision/Assistance - 24 hour;Supervision for mobility/OOB    Equipment Recommendations  None recommended by PT(will see how he evolves from ativan use)    Recommendations for Other Services       Precautions / Restrictions Precautions Precautions: Fall Precaution Comments: monitor for agitation Required Braces or Orthoses: Splint/Cast(LUE wrapped to control wound) Restrictions Weight Bearing Restrictions: No Other Position/Activity Restrictions: limited use of LUE      Mobility  Bed Mobility Overal bed mobility: Needs Assistance Bed Mobility: Supine to Sit;Sit to Supine     Supine to sit: Min assist Sit to supine: Min assist   General bed mobility comments: min assist to support trunk to get OOB and back  Transfers Overall transfer level: Needs assistance Equipment used: Rolling walker (2 wheeled);2 person hand held assist Transfers: Sit to/from Stand Sit to Stand: Min assist;+2 physical assistance;+2 safety/equipment;From elevated surface          General transfer comment: min to power up and to steady with RUE on walker  Ambulation/Gait Ambulation/Gait assistance: Min assist;+2 physical assistance;+2 safety/equipment Gait Distance (Feet): 140 Feet Assistive device: Rolling walker (2 wheeled);2 person hand held assist Gait Pattern/deviations: Step-through pattern;Decreased stride length;Wide base of support Gait velocity: reduced   General Gait Details: used walker to steady with RUE and touch on LUE  Stairs            Wheelchair Mobility    Modified Rankin (Stroke Patients Only)       Balance Overall balance assessment: Needs assistance Sitting-balance support: Feet supported Sitting balance-Leahy Scale: Good     Standing balance support: Bilateral upper extremity supported;During functional activity Standing balance-Leahy Scale: Poor Standing balance comment: less than fair static standing support                             Pertinent Vitals/Pain Pain Assessment: 0-10 Pain Score: 10-Worst pain ever Pain Location: L arm on wound Pain Descriptors / Indicators: Guarding;Grimacing Pain Intervention(s): Monitored during session;Repositioned    Home Living Family/patient expects to be discharged to:: Private residence Living Arrangements: Other relatives Available Help at Discharge: Family;Friend(s);Available PRN/intermittently Type of Home: House Home Access: Stairs to enter Entrance Stairs-Rails: None Entrance Stairs-Number of Steps: 2 Home Layout: One level Home Equipment: Cane - single point Additional Comments: chart reports a cane at home but pt cannot recall    Prior Function Level of Independence: Independent         Comments: has been walking to get to  store     Hand Dominance   Dominant Hand: Right    Extremity/Trunk Assessment   Upper Extremity Assessment Upper Extremity Assessment: LUE deficits/detail LUE Deficits / Details: L wrist is bound up and cannot use UE  well LUE: Unable to fully assess due to immobilization LUE Coordination: decreased fine motor;decreased gross motor    Lower Extremity Assessment Lower Extremity Assessment: Generalized weakness    Cervical / Trunk Assessment Cervical / Trunk Assessment: Normal  Communication   Communication: No difficulties  Cognition Arousal/Alertness: Awake/alert Behavior During Therapy: Anxious Overall Cognitive Status: No family/caregiver present to determine baseline cognitive functioning                                 General Comments: unclear how his current presentation is like prior level      General Comments General comments (skin integrity, edema, etc.): Pt is listing all directions with standing tasks, used RW for RUE support and touching walker on LUE with PT and sitter to assist him.  Likely will not need AD once he is off ativan    Exercises     Assessment/Plan    PT Assessment Patient needs continued PT services  PT Problem List Decreased strength;Decreased range of motion;Decreased activity tolerance;Decreased balance;Decreased mobility;Decreased cognition;Decreased knowledge of use of DME;Decreased safety awareness;Cardiopulmonary status limiting activity;Decreased skin integrity;Pain       PT Treatment Interventions DME instruction;Gait training;Stair training;Functional mobility training;Therapeutic activities;Therapeutic exercise;Balance training;Neuromuscular re-education;Patient/family education    PT Goals (Current goals can be found in the Care Plan section)  Acute Rehab PT Goals Patient Stated Goal: to walk and get home soon PT Goal Formulation: With patient Time For Goal Achievement: 06/06/20 Potential to Achieve Goals: Good    Frequency Min 3X/week   Barriers to discharge Inaccessible home environment;Decreased caregiver support home with questionable assistance    Co-evaluation               AM-PAC PT "6 Clicks" Mobility  Outcome  Measure Help needed turning from your back to your side while in a flat bed without using bedrails?: None Help needed moving from lying on your back to sitting on the side of a flat bed without using bedrails?: A Little Help needed moving to and from a bed to a chair (including a wheelchair)?: A Little Help needed standing up from a chair using your arms (e.g., wheelchair or bedside chair)?: A Little Help needed to walk in hospital room?: A Little Help needed climbing 3-5 steps with a railing? : A Little 6 Click Score: 19    End of Session Equipment Utilized During Treatment: Gait belt Activity Tolerance: Patient limited by pain Patient left: in bed;with call bell/phone within reach;with bed alarm set;with nursing/sitter in room Nurse Communication: Mobility status PT Visit Diagnosis: Unsteadiness on feet (R26.81);Other abnormalities of gait and mobility (R26.89);Difficulty in walking, not elsewhere classified (R26.2);Pain Pain - Right/Left: Left Pain - part of body: Arm;Hand    Time: 9528-4132 PT Time Calculation (min) (ACUTE ONLY): 27 min   Charges:   PT Evaluation $PT Eval Moderate Complexity: 1 Mod PT Treatments $Gait Training: 8-22 mins       Ramond Dial 05/23/2020, 2:45 PM  Mee Hives, PT MS Acute Rehab Dept. Number: [place] and [place]

## 2020-05-24 LAB — HEPATITIS B SURFACE ANTIBODY, QUANTITATIVE: Hep B S AB Quant (Post): <3.1 m[IU]/mL — ABNORMAL LOW (ref >9.9)

## 2020-05-24 NOTE — Plan of Care (Signed)

## 2020-05-24 NOTE — Progress Notes (Signed)
Physical Therapy Treatment Patient Details Name: [name] MRN: [id number] DOB: 12-Aug-1961 Today's Date: 05/24/2020    History of Present Illness Pt is 59 yo male with cellulitis on L arm was admitted for medical care.  Additionally, pt with EtOH w/d during this admission.  PMHx:  AICD, EtOH abuse, OA, CAD, MI with arrest, HTN, CAD, ischemic cardiomyopathy, syncope, seizures,    PT Comments    Pt demonstrating progress today.  He was able to transfer and ambulate with supervision without AD.  Did require min cues for safety and was mildly unsteady with dynamic gait task.  Does have decreased safety awareness and recommend supervision at d/c.     Follow Up Recommendations  No PT follow up;Supervision/Assistance - 24 hour     Equipment Recommendations  None recommended by PT    Recommendations for Other Services       Precautions / Restrictions Precautions Precautions: Fall Restrictions Other Position/Activity Restrictions: limited use of LUE    Mobility  Bed Mobility Overal bed mobility: Needs Assistance Bed Mobility: Supine to Sit;Sit to Supine     Supine to sit: Supervision Sit to supine: Supervision      Transfers Overall transfer level: Needs assistance Equipment used: None Transfers: Sit to/from Stand Sit to Stand: Supervision            Ambulation/Gait Ambulation/Gait assistance: Supervision Gait Distance (Feet): 200 Feet Assistive device: None Gait Pattern/deviations: Step-through pattern;Wide base of support;Drifts right/left Gait velocity: reduced   General Gait Details: stiff gait quality with mild unsteadiness; pt was able to change speeds but had difficulty with head turns   Stairs Stairs: Yes Stairs assistance: Min guard Stair Management: One rail Right;Alternating pattern Number of Stairs: 8 General stair comments: min guard and cues for use of rail for safety   Wheelchair Mobility    Modified Rankin (Stroke Patients  Only)       Balance Overall balance assessment: Needs assistance Sitting-balance support: Feet supported Sitting balance-Leahy Scale: Good     Standing balance support: During functional activity;No upper extremity supported Standing balance-Leahy Scale: Good                              Cognition Arousal/Alertness: Awake/alert Behavior During Therapy: Flat affect;Impulsive Overall Cognitive Status: No family/caregiver present to determine baseline cognitive functioning                                 General Comments: decreased safety awareness      Exercises      General Comments        Pertinent Vitals/Pain Pain Assessment: 0-10 Pain Score: 8  Pain Location: L arm on wound Pain Descriptors / Indicators: Other (Comment)(no signs of pain) Pain Intervention(s): Limited activity within patient's tolerance    Home Living                      Prior Function            PT Goals (current goals can now be found in the care plan section) Progress towards PT goals: Progressing toward goals    Frequency    Min 3X/week      PT Plan Discharge plan needs to be updated    Co-evaluation              AM-PAC PT "6 Clicks" Mobility  Outcome Measure  Help needed turning from your back to your side while in a flat bed without using bedrails?: None Help needed moving from lying on your back to sitting on the side of a flat bed without using bedrails?: None Help needed moving to and from a bed to a chair (including a wheelchair)?: None Help needed standing up from a chair using your arms (e.g., wheelchair or bedside chair)?: None Help needed to walk in hospital room?: None Help needed climbing 3-5 steps with a railing? : None 6 Click Score: 24    End of Session Equipment Utilized During Treatment: Gait belt Activity Tolerance: Patient tolerated treatment well Patient left: in bed;with call bell/phone within reach;with bed  alarm set;with nursing/sitter in room Nurse Communication: Mobility status PT Visit Diagnosis: Unsteadiness on feet (R26.81);Other abnormalities of gait and mobility (R26.89);Difficulty in walking, not elsewhere classified (R26.2);Pain Pain - Right/Left: Left Pain - part of body: Arm;Hand     Time: 3875-6433 PT Time Calculation (min) (ACUTE ONLY): 20 min  Charges:  $Gait Training: 8-22 mins                     [name], PT Acute Rehab Services Pager [phone] [facility] [phone]    [name] 05/24/2020, 2:02 PM
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
## 2020-05-24 NOTE — Progress Notes (Signed)
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  He also has been complaining of significant pain in the left wrist, inability to form a full fist as well.  Subjective / 24h Interval events: Overnight still had few episodes of confusion and agitation.  He is clear this morning, states he feels "awesome".  He tells me the year is 2002 but knows he is at [facility]  Assessment & Plan: Principal Problem Left hand, upper extremity cellulitis-he had very quick progression.  Although ultrasound did not show any abscesses, patient underwent a CT scan of the left hand and forearm on 5/26 which showed a rim-enhancing abscess.  Orthopedic surgery consulted, appreciate input, status post I&D on 5/27.  He was placed on Unasyn along with vancomycin, continue.  Cultures from abscess is growing gram-positive cocci which speciated to his group A strep, strep pyogenes.  Convert antibiotics to Augmentin -Overall stable, afebrile, leukocytosis resolved  Active Problems CAD-chest pain-free, continue statin, beta-blocker, aspirin  Chronic combined systolic and diastolic CHF-currently appears euvolemic, no evidence of fluid overload.  Continue Entresto, spironolactone.  No events on telemetry, personally reviewed, discontinued 5/29 given restlessness and pulling ambulates  History of seizures-continue home Keppra.  No seizures while here  Essential hypertension-continue regimen as below  Hyperlipidemia-continue home medications  Alcohol use with withdrawal with delirium-pretty  significant withdrawal during this hospital stay, with intermittent delirium and confusion, overall appears to be improving   Scheduled Meds: . amoxicillin-clavulanate  1 tablet Oral Q12H  . aspirin EC  81 mg Oral Daily  . atorvastatin  80 mg Oral q1800  . carvedilol  3.125 mg Oral BID WC  . enoxaparin (LOVENOX) injection  40 mg Subcutaneous QHS  . ezetimibe  10 mg Oral Daily  . folic acid  1 mg Oral Daily  . ivabradine  2.5 mg Oral BID WC  . levETIRAcetam  500 mg Oral BID  . LORazepam  0-4 mg Intravenous Q12H  . multivitamin with minerals  1 tablet Oral Daily  . mupirocin ointment  1 application Topical BID  . sacubitril-valsartan  1 tablet Oral BID  . spironolactone  25 mg Oral Daily  . thiamine  100 mg Oral Daily   Or  . thiamine  100 mg Intravenous Daily   Continuous Infusions:  PRN Meds:.acetaminophen **OR** acetaminophen, diphenhydrAMINE, LORazepam **OR** LORazepam, ondansetron **OR** ondansetron (ZOFRAN) IV, oxyCODONE-acetaminophen **OR** oxyCODONE-acetaminophen, polyethylene glycol  DVT prophylaxis: Lovenox Code Status: Full code Family Communication: no family at bedside, discussed with sister over the phone 5/29  Status is: Inpatient  Remains inpatient due to: Ongoing encephalopathy however overall much improved, anticipate home discharge within 24 hours if he continues to improve at the same rate  Dispo: The patient is from: Home              Anticipated d/c is to: Home              Anticipated d/c date is: 1 day              Patient currently is not medically stable to d/c.  Consultants:  Orthopedic surgery  Procedures:  I&D 5/27  Microbiology  None   Antimicrobials: Vancomycin / Unasyn 5/25 >> 5/29 Augmentin 5/29  Objective: Vitals:   05/23/20 2053 05/24/20 0500 05/24/20 0955 05/24/20 1118  BP: 117/81 117/76 105/67 109/78  Pulse: 77 73 99 100  Resp: 18 15 18 18   Temp: 98.9 F (37.2 C) 98.7 F (37.1 C)    TempSrc: Axillary Axillary    SpO2:  100% 100% 98% 100%  Weight:      Height:        Intake/Output Summary (Last 24 hours) at 05/24/2020 1129 Last data filed at 05/24/2020 0730 Gross per 24 hour  Intake --  Output 2700 ml  Net -2700 ml   Filed Weights   05/18/20 1245 05/18/20 1949 05/20/20 1404  Weight: 68 kg 75 kg 75 kg    Examination:  Constitutional: No distress, in bed Eyes: No scleral icterus ENMT: Moist mucous membranes Neck: normal, supple Respiratory: Clear bilaterally, no wheezing or crackles heard Cardiovascular: Regular rate and rhythm, no murmurs Abdomen: Soft, nontender, nondistended, bowel sounds positive Musculoskeletal: no clubbing / cyanosis.  Skin: Left wrist Ace wrapped Neurologic: No focal deficits, tremulousness improved  Data Reviewed: I have independently reviewed following labs and imaging studies   CBC: Recent Labs  Lab 05/18/20 1242 05/18/20 1242 05/19/20 0345 05/20/20 0920 05/21/20 0434 05/22/20 0913 05/23/20 1124  WBC 13.7*   < > 14.2* 10.3 9.4 7.9 9.7  NEUTROABS 11.5*  --  11.3*  --   --   --   --   HGB 15.6   < > 14.3 13.5 14.1 12.8* 13.2  HCT 46.0   < > 41.9 40.7 42.8 38.3* 39.1  MCV 100.2*   < > 97.9 99.5 101.9* 99.0 97.5  PLT 169   < > 153 159 186 186 255   < > = values in this interval not displayed.   Basic Metabolic Panel: Recent Labs  Lab 05/19/20 0345 05/20/20 0920 05/21/20 0434 05/22/20 0913 05/23/20 1124  NA 132* 136 137 137 138  K 4.0 3.9 4.0 4.0 4.1  CL 95* 104 106 103 104  CO2 24 22 23 24 24   GLUCOSE 126* 104* 118* 97 106*  BUN 9 9 9 6 9   CREATININE 0.78 0.75 0.82 0.72 0.97  CALCIUM 8.5* 8.2* 8.4* 8.6* 9.0  MG 2.1  --   --   --   --    Liver Function Tests: Recent Labs  Lab 05/18/20 1242 05/19/20 0345 05/20/20 0920 05/22/20 0913 05/23/20 1124  AST 28 19 26 27 30   ALT 24 16 19 23 27   ALKPHOS 66 50 49 51 55  BILITOT 1.2 0.9 1.0 0.9 0.7  PROT 7.6 6.1* 5.9* 6.2* 6.9  ALBUMIN 3.8 2.7* 2.7* 2.6* 2.9*   Coagulation Profile: Recent Labs   Lab 05/18/20 1242  INR 1.0   HbA1C: No results for input(s): HGBA1C in the last 72 hours. CBG: No results for input(s): GLUCAP in the last 168 hours.  Recent Results (from the past 240 hour(s))  Culture, blood (Routine x 2)     Status: None   Collection Time: 05/18/20 12:42 PM   Specimen: BLOOD  Result Value Ref Range Status   Specimen Description BLOOD SITE NOT SPECIFIED  Final   Special Requests   Final    BOTTLES DRAWN AEROBIC ONLY Blood Culture adequate volume   Culture   Final    NO GROWTH 5 DAYS Performed at [facility], [address],  [place] [address]    Report Status 05/23/2020 FINAL  Final  Culture, blood (Routine x 2)     Status: None   Collection Time: 05/18/20 12:44 PM   Specimen: BLOOD  Result Value Ref Range Status   Specimen Description BLOOD SITE NOT SPECIFIED  Final   Special Requests   Final    BOTTLES DRAWN AEROBIC AND ANAEROBIC Blood Culture adequate volume   Culture   Final    NO GROWTH 5 DAYS Performed at [facility], [address] [place], [place] [address]    Report Status 05/23/2020 FINAL  Final  Urine culture     Status: Abnormal   Collection Time: 05/18/20  5:59 PM   Specimen: In/Out Cath Urine  Result Value Ref Range Status   Specimen Description IN/OUT CATH URINE  Final   Special Requests   Final    NONE Performed at [facility], [address], [place], [place] [address]    Culture MULTIPLE SPECIES PRESENT, SUGGEST RECOLLECTION (A)  Final   Report Status 05/20/2020 FINAL  Final  Blood Culture (routine x 2)     Status: None   Collection Time: 05/18/20  6:12 PM   Specimen: BLOOD  Result Value Ref Range Status   Specimen Description BLOOD LEFT ANTECUBITAL  Final   Special Requests   Final    BOTTLES DRAWN AEROBIC AND ANAEROBIC Blood Culture results may not be optimal due to an excessive volume of blood received in culture bottles   Culture   Final    NO GROWTH 5 DAYS Performed at [facility]  Lab, [address], [place], [place] [address]    Report Status 05/23/2020 FINAL  Final  SARS Coronavirus 2 by RT PCR (hospital order, performed in [facility] hospital lab) Nasopharyngeal Nasopharyngeal Swab     Status: None   Collection Time: 05/18/20  6:36 PM   Specimen: Nasopharyngeal Swab  Result Value Ref Range Status   SARS Coronavirus 2 NEGATIVE NEGATIVE Final    Comment: (NOTE) SARS-CoV-2 target nucleic acids are NOT DETECTED. The SARS-CoV-2 RNA is generally detectable in upper and lower respiratory specimens during the acute phase of infection. The lowest concentration of SARS-CoV-2 viral copies this assay can detect is 250 copies / mL. A negative result does not preclude SARS-CoV-2 infection and should not be used as the sole basis for treatment or other patient management decisions.  A negative result may occur with improper specimen collection / handling, submission of specimen other than nasopharyngeal swab, presence of viral mutation(s) within the areas targeted by this assay, and inadequate number of viral copies (<250 copies / mL). A negative result must be combined with clinical observations, patient history, and epidemiological information. Fact Sheet for Patients:   BoilerBrush.com.cy Fact Sheet for Healthcare Providers: https://pope.com/ This test is not yet approved or cleared  by the [place] FDA and has been authorized for detection and/or diagnosis of SARS-CoV-2 by FDA under an Emergency Use Authorization (EUA).  This EUA will remain in effect (meaning this test can be used) for the duration of the COVID-19 declaration under Section 564(b)(1) of the Act, 21 U.S.C. section 360bbb-3(b)(1), unless the authorization is terminated or revoked sooner. Performed at [facility], [address] [place], [place] [address]   MRSA PCR Screening     Status: Abnormal   Collection Time: 05/19/20  1:00 AM   Specimen:  Nasopharyngeal  Result Value Ref Range Status   MRSA by PCR  POSITIVE (A) NEGATIVE Final    Comment: CRITICAL RESULT CALLED TO, READ BACK BY AND VERIFIED WITH: RN DAVID Boneta Lucks [phone] @[phone]  THANEY   Anaerobic culture     Status: None (Preliminary result)   Collection Time: 05/20/20  2:27 PM   Specimen: PATH Other; Tissue  Result Value Ref Range Status   Specimen Description WOUND LEFT WRIST  Final   Special Requests NONE  Final   Gram Stain   Final    RARE WBC PRESENT, PREDOMINANTLY PMN FEW GRAM POSITIVE COCCI Performed at [facility] Lab, [address], [place], [place]    Culture   Final    NO ANAEROBES ISOLATED; CULTURE IN PROGRESS FOR 5 DAYS   Report Status PENDING  Incomplete  Aerobic Culture (superficial specimen)     Status: None   Collection Time: 05/20/20  2:27 PM   Specimen: Wound  Result Value Ref Range Status   Specimen Description WOUND LEFT WRIST  Final   Special Requests NONE  Final   Gram Stain   Final    RARE WBC PRESENT, PREDOMINANTLY PMN FEW GRAM POSITIVE COCCI    Culture   Final    FEW GROUP A STREP (S.PYOGENES) ISOLATED Beta hemolytic streptococci are predictably susceptible to penicillin and other beta lactams. Susceptibility testing not routinely performed. Performed at [facility], [address], [place], [place]    Report Status 05/22/2020 FINAL  Final     Radiology Studies: No results found. 05/24/2020, MD, PhD [facility]  Between 7 am - 7 pm I am available, please contact me via Amion or Securechat  Between 7 pm - 7 am I am not available, please contact night coverage MD/APP via Amion

## 2020-05-25 ENCOUNTER — Encounter (HOSPITAL_COMMUNITY): Payer: Medicaid Other

## 2020-05-25 LAB — ANAEROBIC CULTURE

## 2020-05-25 MED ORDER — AMOXICILLIN-POT CLAVULANATE 875-125 MG PO TABS: 1.0000 | ORAL_TABLET | Freq: Two times a day (BID) | ORAL | 0 refills | Status: AC

## 2020-05-25 MED FILL — AMOX-CLAV 875-125 MG TABLET: 875-125 | 5 days supply | Qty: 10 | Fill #0

## 2020-05-25 NOTE — Progress Notes (Signed)
[name] to be discharged home per MD order.  Discussed prescriptions and follow up appointments with the patient. Prescriptions filled here in hospital & given to patient, medication list explained in detail. Pt verbalized understanding.  Allergies as of 05/25/2020   No Known Allergies     Medication List    TAKE these medications   amoxicillin-clavulanate 875-125 MG tablet Commonly known as: AUGMENTIN Take 1 tablet by mouth every 12 (twelve) hours for 5 days.   atorvastatin 80 MG tablet Commonly known as: LIPITOR Take 1 tablet (80 mg total) by mouth daily at 6 PM.   carvedilol 3.125 MG tablet Commonly known as: COREG Take 1 tablet (3.125 mg total) by mouth 2 (two) times daily with a meal.   Entresto 24-26 MG Generic drug: sacubitril-valsartan Take 1 tablet by mouth 2 (two) times daily.   ezetimibe 10 MG tablet Commonly known as: ZETIA Take 1 tablet (10 mg total) by mouth daily.   folic acid 1 MG tablet Commonly known as: FOLVITE Take 1 tablet (1 mg total) by mouth daily.   furosemide 40 MG tablet Commonly known as: LASIX Take 1 tablet (40 mg total) by mouth daily as needed for fluid.   ivabradine 5 MG Tabs tablet Commonly known as: Corlanor Take 1 tablet (5 mg total) by mouth 2 (two) times daily with a meal. What changed: how much to take   levETIRAcetam 500 MG tablet Commonly known as: KEPPRA Take 1 tablet (500 mg total) by mouth 2 (two) times daily.   spironolactone 25 MG tablet Commonly known as: ALDACTONE Take 1 tablet (25 mg total) by mouth daily.   thiamine 100 MG tablet Take 1 tablet (100 mg total) by mouth daily.       Vitals:   05/25/20 0500 05/25/20 1015  BP: 128/70 125/86  Pulse: 87 75  Resp: 18 20  Temp: 98.1 F (36.7 C) 98 F (36.7 C)  SpO2: 100% 100%    IV catheter discontinued. Dressing and pressure applied. Pt denies pain at this time. No complaints noted. Left forearm dressing was changed prior to patient leaving &  supplies sent with him to change at home.  An After Visit Summary was printed and given to the patient. Patient escorted downstairs and transportation was provided by hospital, to take patient home to [place].  [name] [name] [name] 05/25/2020 3:41 PM

## 2020-05-25 NOTE — Progress Notes (Addendum)
Physical Therapy Treatment Patient Details Name: [name] MRN: [id number] DOB: [birth year] Today's Date: 05/25/2020    History of Present Illness Pt is 59 yo male with cellulitis on L arm was admitted for medical care.  Additionally, pt with EtOH w/d during this admission.  PMHx:  AICD, EtOH abuse, OA, CAD, MI with arrest, HTN, CAD, ischemic cardiomyopathy, syncope, seizures,    PT Comments    Pt supine in bed on arrival.  Pt very motivated to "walk", reports wanting to "go outside.".  Pt is functioning independently and based on meeting his PT goals will inform PT that goals are met and PT is no longer needed during acute stay.    Physical Therapy Discharge Patient Details Name: [name] MRN: [id number] DOB: 09-21-1961 Today's Date: 05/25/2020 Time: 4235-3614 PT Time Calculation (min) (ACUTE ONLY): 23 min  Patient discharged from PT services secondary to goals met and no further PT needs identified.  Please see latest therapy progress note for current level of functioning and progress toward goals.    Progress and discharge plan discussed with patient and/or caregiver: Patient agrees with plan  GP     [name] 05/25/2020, 12:39 PM    Follow Up Recommendations  No PT follow up     Equipment Recommendations  None recommended by PT    Recommendations for Other Services       Precautions / Restrictions Precautions Precautions: Fall Precaution Comments: monitor for agitation- no agitation this session. Restrictions Weight Bearing Restrictions: No Other Position/Activity Restrictions: limited use of LUE, wound dressed with ace wrap    Mobility  Bed Mobility Overal bed mobility: Needs Assistance Bed Mobility: Supine to Sit     Supine to sit: Modified independent (Device/Increase time)     General bed mobility comments: No assistance to come to sitting at edge of bed.  Transfers Overall transfer level: Independent Equipment used:  None Transfers: Sit to/from Stand              Ambulation/Gait Ambulation/Gait assistance: Independent Gait Distance (Feet): 450 Feet Assistive device: None Gait Pattern/deviations: Step-through pattern;Wide base of support;WFL(Within Functional Limits)     General Gait Details: Wide base is likely baseline, reports feeling tingling in feet which bothers him   Stairs Stairs: Yes Stairs assistance: Modified independent (Device/Increase time) Stair Management: One rail Right;Alternating pattern Number of Stairs: 12 General stair comments: Good safety during stair training   Wheelchair Mobility    Modified Rankin (Stroke Patients Only)       Balance Overall balance assessment: Needs assistance Sitting-balance support: Feet supported Sitting balance-Leahy Scale: Good       Standing balance-Leahy Scale: Good                              Cognition Arousal/Alertness: Awake/alert Behavior During Therapy: WFL for tasks assessed/performed Overall Cognitive Status: Within Functional Limits for tasks assessed                                 General Comments: Safety appears within normal limits.      Exercises      General Comments        Pertinent Vitals/Pain Pain Assessment: 0-10 Pain Score: 8  Pain Location: B feet Pain Descriptors / Indicators: Tingling Pain Intervention(s): Monitored during session    Home Living  Prior Function            PT Goals (current goals can now be found in the care plan section) Acute Rehab PT Goals Patient Stated Goal: to walk and get home soon PT Goal Formulation: With patient Potential to Achieve Goals: Good Progress towards PT goals: Goals met/education completed, patient discharged from PT    Frequency    Min 3X/week      PT Plan Current plan remains appropriate    Co-evaluation              AM-PAC PT "6 Clicks" Mobility   Outcome Measure   Help needed turning from your back to your side while in a flat bed without using bedrails?: None Help needed moving from lying on your back to sitting on the side of a flat bed without using bedrails?: None Help needed moving to and from a bed to a chair (including a wheelchair)?: None Help needed standing up from a chair using your arms (e.g., wheelchair or bedside chair)?: None Help needed to walk in hospital room?: None Help needed climbing 3-5 steps with a railing? : None 6 Click Score: 24    End of Session Equipment Utilized During Treatment: Gait belt Activity Tolerance: Patient tolerated treatment well Patient left: with call bell/phone within reach;in chair(safety sitter in his room so no alarm turned on.) Nurse Communication: Mobility status PT Visit Diagnosis: Unsteadiness on feet (R26.81);Other abnormalities of gait and mobility (R26.89);Difficulty in walking, not elsewhere classified (R26.2);Pain Pain - Right/Left: Left Pain - part of body: Arm;Hand     Time: 4259-5638 PT Time Calculation (min) (ACUTE ONLY): 23 min  Charges:  $Gait Training: 23-37 mins                     Erasmo Leventhal , PTA Acute Rehabilitation Services Pager [phone] Office [phone]     [name] 05/25/2020, 12:38 PM
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
## 2020-05-25 NOTE — Discharge Summary (Signed)
Physician Discharge Summary  [name] ZOX:[id number] DOB: 07/11/1961 DOA: 05/18/2020  PCP: Claiborne Rigg, NP  Admit date: 05/18/2020 Discharge date: 05/25/2020  Admitted From: home Disposition:  home  Recommendations for Outpatient Follow-up:  1. Follow up with PCP in 1-2 weeks 2. Follow up with Dr. 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 Patient explains that approximately 2 days ago he began to experience redness warmth and swelling of the dorsum of the left hand.  The 24 to 48 hours that followed the symptoms rapidly worsened and progressed proximally up the left arm.  This is been associated with severe left upper extremity pain, throbbing in quality, radiating proximally and worse with movement of the affected extremity.  Patient is also complaining of generalized weakness.  Patient denies any changes in appetite, nausea, vomiting or fevers.  Patient denies sick contacts, recent travel, shortness of breath, cough or confirmed contacts with COVID-19. Patient symptoms continue to worsen.  Patient was paid a visit by his community paramedic this morning who saw his impressive left upper extremity cellulitis and urged the patient to come to the hospital.  At that point, EMS was called and patient was brought into [facility] emergency  department. On evaluation in the emergency department patient was clinically felt to be suffering from a left upper extremity cellulitis.  Patient was initiated on intravenous ceftriaxone and vancomycin.  Patient was hydrated with 2250 cc of lactated Ringer solution.  Patient was noted to be suffering from a lactic acidosis of 2.2 as well as leukocytosis of 13.7.  Blood cultures were obtained.  The hospitalist group was then called to assess the patient for admission the hospital.   Hospital Course / Discharge diagnoses: Principal Problem Left hand, upper extremity cellulitis-he had very quick progression in the past 1 to 2 days before hospitalization. He initially had an ultrasound in the ED which did not show any abscesses however this was followed up with a CT scan which showed a rim-enhancing abscess. Orthopedic surgery consulted and patient underwent an I&D on 5/27. He was initially maintained on Unasyn along with vancomycin, and cultures from abscess grew group A strep, strep pyogenes. His antibiotics were converted to Augmentin, he has remained afebrile, his leukocytosis has improved and his cellulitis resolved. He is stable, will be discharged home and will have outpatient follow-up with Dr. Roney Mans as an outpatient. He will be discharged on five additional days with Augmentin  Active Problems CAD-chest pain-free, continue statin, beta-blocker, aspirin Chronic combined systolic and diastolic CHF-currently appears euvolemic, no evidence of fluid overload. Resume home medications on discharge History of seizures-continue home Keppra.  No seizures while here Essential hypertension-continue regimen as below Hyperlipidemia-continue home medications Alcohol use with withdrawal with delirium-pretty significant withdrawal during this hospital stay requiring Ativan, was confused at times but improved, returned to baseline. He was advised not to drink alcohol anymore however high suspicion that he  will relapse Social-patient is being followed closely by SW from the  heart failure clinic. There were concerned about patient injecting drugs but that does not appear to be the case, he does abuse alcohol however   Discharge Instructions   Allergies as of 05/25/2020   No Known Allergies     Medication List    TAKE these medications   amoxicillin-clavulanate 875-125 MG tablet Commonly known as: AUGMENTIN Take 1 tablet by mouth every 12 (twelve) hours for 5 days.   atorvastatin 80 MG tablet Commonly known as: LIPITOR Take 1 tablet (80 mg total) by mouth daily at 6 PM.   carvedilol 3.125 MG tablet Commonly known as: COREG Take 1 tablet (3.125 mg total) by mouth 2 (two) times daily with a meal.   Entresto 24-26 MG Generic drug: sacubitril-valsartan Take 1 tablet by mouth 2 (two) times daily.   ezetimibe 10 MG tablet Commonly known as: ZETIA Take 1 tablet (10 mg total) by mouth daily.   folic acid 1 MG tablet Commonly known as: FOLVITE Take 1 tablet (1 mg total) by mouth daily.   furosemide 40 MG tablet Commonly known as: LASIX Take 1 tablet (40 mg total) by mouth daily as needed for fluid.   ivabradine 5 MG Tabs tablet Commonly known as: Corlanor Take 1 tablet (5 mg total) by mouth 2 (two) times daily with a meal. What changed: how much to take   levETIRAcetam 500 MG tablet Commonly known as: KEPPRA Take 1 tablet (500 mg total) by mouth 2 (two) times daily.   spironolactone 25 MG tablet Commonly known as: ALDACTONE Take 1 tablet (25 mg total) by mouth daily.   thiamine 100 MG tablet Take 1 tablet (100 mg total) by mouth daily.       Consultations:  Orthopedic surgery   Procedures/Studies:  DG Wrist Complete Left  Result Date: 05/18/2020 CLINICAL DATA:  Pt has severe swelling and redness to left hand radiating up his left arm. Reports that it could be an insect bite. Pt had very limited movement. EXAM: LEFT WRIST - COMPLETE 3+ VIEW COMPARISON:  None.  FINDINGS: There is a separate bone fragment along the ulnar aspect of the distal ulna which could reflect an old fracture of the ulnar styloid or be developmental, the latter suspected. No acute fracture.  No bone lesion. Wrist joints are normally spaced and aligned. No arthropathic changes. There is diffuse surrounding soft tissue swelling in dense arterial vascular calcifications. IMPRESSION: 1. No acute fracture.  No joint abnormality.  No bone lesion. 2. Diffuse nonspecific soft tissue swelling. Electronically Signed   By: Amie Portland M.D.   On: 05/18/2020 13:04   CT FOREARM LEFT W CONTRAST  Result Date: 05/19/2020 CLINICAL DATA:  Hand wrist and forearm pain and swelling for several days. Possible insect bite. EXAM: CT OF THE UPPER LEFT EXTREMITY WITH CONTRAST TECHNIQUE: Multidetector CT imaging of the upper left extremity was performed according to the standard protocol following intravenous contrast administration. CONTRAST:  OMNIPAQUE IOHEXOL 300 MG/ML  SOLN COMPARISON:  Ultrasound 05/18/2020 FINDINGS: Severe diffuse subcutaneous soft tissue swelling/edema/fluid involving the entire hand, wrist and forearm most notably along the dorsal aspect. There is a focal thick walled rim enhancing abscess located on the dorsal aspect of the proximal wrist. This measures approximately 19.5 x 11.5 mm and is located along the dorsal aspect of the distal radius at the metadiaphyseal junction level. No other discrete abscess is identified. I do not see any definite CT findings for myofasciitis or pyomyositis. No findings suspicious for septic arthritis or osteomyelitis. Severe  age advanced vascular disease. Remote ununited ulnar styloid fracture. No acute bony findings or bone lesions. IMPRESSION: 1. Severe diffuse subcutaneous soft tissue swelling/edema/fluid involving the entire hand, wrist and forearm most notably along the dorsal aspect of the proximal wrist. 2. 19.5 x 11.5 mm thick walled rim enhancing  abscess located along the dorsal aspect of the distal radius at the metadiaphyseal junction level. 3. No definite CT findings for myofasciitis or pyomyositis. 4. No findings suspicious for septic arthritis or osteomyelitis. 5. 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 Electronically Signed   By: Rudie Meyer M.D.   On: 05/19/2020 09:57   CT HAND LEFT W CONTRAST  Result Date: 05/19/2020 CLINICAL DATA:  Hand wrist and forearm pain and swelling for several days. Possible insect bite. EXAM: CT OF THE UPPER LEFT EXTREMITY WITH CONTRAST TECHNIQUE: Multidetector CT imaging of the upper left extremity was performed according to the standard protocol following intravenous contrast administration. CONTRAST:  OMNIPAQUE IOHEXOL 300 MG/ML  SOLN COMPARISON:  Ultrasound 05/18/2020 FINDINGS: Severe diffuse subcutaneous soft tissue swelling/edema/fluid involving the entire hand, wrist and forearm most notably along the dorsal aspect. There is a focal thick walled rim enhancing abscess located on the dorsal aspect of the proximal wrist. This measures approximately 19.5 x 11.5 mm and is located along the dorsal aspect of the distal radius at the metadiaphyseal junction level. No other discrete abscess is identified. I do not see any definite CT findings for myofasciitis or pyomyositis. No findings suspicious for septic arthritis or osteomyelitis. 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 Remote ununited ulnar styloid fracture. No acute bony findings or bone lesions. IMPRESSION: 1. Severe diffuse subcutaneous soft tissue swelling/edema/fluid involving the entire hand, wrist and forearm most notably along the dorsal aspect of the proximal wrist. 2. 19.5 x 11.5 mm thick walled rim enhancing abscess located along the dorsal aspect of the distal radius at the metadiaphyseal junction level. 3. No definite CT findings for myofasciitis or pyomyositis. 4. No findings suspicious for septic arthritis or osteomyelitis. 5. Severe age advanced  vascular disease. Electronically Signed   By: Rudie Meyer M.D.   On: 05/19/2020 09:57   DG Hand Complete Left  Result Date: 05/18/2020 CLINICAL DATA:  Pt has severe swelling and redness to left hand radiating up his left arm. Reports that it could be an insect bite. Pt had very limited movement EXAM: LEFT HAND - COMPLETE 3+ VIEW COMPARISON:  None. FINDINGS: No acute fracture. No bone lesion. There is joint space narrowing most evident at the second MCP joint. No periarticular erosions. There is diffuse soft tissue swelling that is most evident over the dorsal aspect of the hand extending to the wrist. Dense arterial vascular calcifications are noted. IMPRESSION: 1. No acute fracture.  No dislocation. 2. Arthropathic changes most evident at the second and third metacarpophalangeal joints, which may reflect an inflammatory arthropathy given the joint involvement. No periarticular erosion is seen, however. 3. Diffuse nonspecific soft tissue swelling most prominent dorsally. Electronically Signed   By: Amie Portland M.D.   On: 05/18/2020 13:03   [place] LT UPPER EXTREM LTD SOFT TISSUE NON VASCULAR  Result Date: 05/18/2020 CLINICAL DATA:  Redness, pain and swelling involving the right hand. EXAM: ULTRASOUND right UPPER EXTREMITY LIMITED TECHNIQUE: Ultrasound examination of the upper extremity soft tissues was performed in the area of clinical concern. COMPARISON:  Radiographs 05/18/2020 FINDINGS: Extensive dorsal soft tissue swelling/edema/fluid suggesting severe cellulitis. No discrete drainable fluid collection to suggest an abscess. IMPRESSION: Marked inflammation/edema and interstitial fluid  throughout the dorsum of the hand but no discrete abscess. Electronically Signed   By: Rudie Meyer M.D.   On: 05/18/2020 19:09     Subjective: - no chest pain, shortness of breath, no abdominal pain, nausea or vomiting.   Discharge Exam: BP 125/86 (BP Location: Right Arm)   Pulse 75   Temp 98 F (36.7 C) (Oral)    Resp 20   Ht 5\' 10"  (1.778 m)   Wt 75 kg   SpO2 100%   BMI 23.72 kg/m   General: Pt is alert, awake, not in acute distress Cardiovascular: RRR, S1/S2 +, no rubs, no gallops Respiratory: CTA bilaterally, no wheezing, no rhonchi Abdominal: Soft, NT, ND, bowel sounds + Extremities: no edema, no cyanosis    The results of significant diagnostics from this hospitalization (including imaging, microbiology, ancillary and laboratory) are listed below for reference.     Microbiology: Recent Results (from the past 240 hour(s))  Culture, blood (Routine x 2)     Status: None   Collection Time: 05/18/20 12:42 PM   Specimen: BLOOD  Result Value Ref Range Status   Specimen Description BLOOD SITE NOT SPECIFIED  Final   Special Requests   Final    BOTTLES DRAWN AEROBIC ONLY Blood Culture adequate volume   Culture   Final    NO GROWTH 5 DAYS Performed at [facility], [address] [place], [place]    Report Status 05/23/2020 FINAL  Final  Culture, blood (Routine x 2)     Status: None   Collection Time: 05/18/20 12:44 PM   Specimen: BLOOD  Result Value Ref Range Status   Specimen Description BLOOD SITE NOT SPECIFIED  Final   Special Requests   Final    BOTTLES DRAWN AEROBIC AND ANAEROBIC Blood Culture adequate volume   Culture   Final    NO GROWTH 5 DAYS Performed at [facility], [address], [place], [place]    Report Status 05/23/2020 FINAL  Final  Urine culture     Status: Abnormal   Collection Time: 05/18/20  5:59 PM   Specimen: In/Out Cath Urine  Result Value Ref Range Status   Specimen Description IN/OUT CATH URINE  Final   Special Requests   Final    NONE Performed at [facility], [address], [place], [place]    Culture MULTIPLE SPECIES PRESENT, SUGGEST RECOLLECTION (A)  Final   Report Status 05/20/2020 FINAL  Final  Blood Culture (routine x 2)     Status: None   Collection Time: 05/18/20  6:12 PM   Specimen:  BLOOD  Result Value Ref Range Status   Specimen Description BLOOD LEFT ANTECUBITAL  Final   Special Requests   Final    BOTTLES DRAWN AEROBIC AND ANAEROBIC Blood Culture results may not be optimal due to an excessive volume of blood received in culture bottles   Culture   Final    NO GROWTH 5 DAYS Performed at [facility], [address], [place], [place]    Report Status 05/23/2020 FINAL  Final  SARS Coronavirus 2 by RT PCR (hospital order, performed in [facility] hospital lab) Nasopharyngeal Nasopharyngeal Swab     Status: None   Collection Time: 05/18/20  6:36 PM   Specimen: Nasopharyngeal Swab  Result Value Ref Range Status   SARS Coronavirus 2 NEGATIVE NEGATIVE Final    Comment: (NOTE) SARS-CoV-2 target nucleic acids are NOT DETECTED.  The SARS-CoV-2 RNA is generally detectable in upper and lower respiratory specimens during the acute phase of infection. The lowest concentration of SARS-CoV-2 viral copies this assay can detect is 250 copies / mL. A negative result does not preclude SARS-CoV-2 infection and should not be used as the sole basis for treatment or other patient management decisions.  A negative result may occur with improper specimen collection / handling, submission of specimen other than nasopharyngeal swab, presence of viral mutation(s) within the areas targeted by this assay, and inadequate number of viral copies (<250 copies / mL). A negative result must be combined with clinical observations, patient history, and epidemiological information. Fact Sheet for Patients:   BoilerBrush.com.cy Fact Sheet for Healthcare Providers: https://pope.com/ This test is not yet approved or cleared  by the [place] FDA and has been authorized for detection and/or diagnosis of SARS-CoV-2 by FDA under an Emergency Use Authorization (EUA).  This EUA will remain in effect (meaning this test can be used) for the  duration of the COVID-19 declaration under Section 564(b)(1) of the Act, 21 U.S.C. section 360bbb-3(b)(1), unless the authorization is terminated or revoked sooner. Performed at [name], [address], [place], [place] [address]   MRSA PCR Screening     Status: Abnormal   Collection Time: 05/19/20  1:00 AM   Specimen: Nasopharyngeal  Result Value Ref Range Status   MRSA by PCR POSITIVE (A) NEGATIVE Final    Comment: CRITICAL RESULT CALLED TO, READ BACK BY AND VERIFIED WITH: RN DAVID Boneta Lucks [phone] @[id number]  THANEY   Anaerobic culture     Status: None   Collection Time: 05/20/20  2:27 PM   Specimen: PATH Other; Tissue  Result Value Ref Range Status   Specimen Description WOUND LEFT WRIST  Final   Special Requests NONE  Final   Gram Stain   Final    RARE WBC PRESENT, PREDOMINANTLY PMN FEW GRAM POSITIVE COCCI    Culture   Final    NO ANAEROBES ISOLATED Performed at [facility], [address], [place], [place]    Report Status 05/25/2020 FINAL  Final  Aerobic Culture (superficial specimen)     Status: None   Collection Time: 05/20/20  2:27 PM   Specimen: Wound  Result Value Ref Range Status   Specimen Description WOUND LEFT WRIST  Final   Special Requests NONE  Final   Gram Stain   Final    RARE WBC PRESENT, PREDOMINANTLY PMN FEW GRAM POSITIVE COCCI    Culture   Final    FEW GROUP A STREP (S.PYOGENES) ISOLATED Beta hemolytic streptococci are predictably susceptible to penicillin and other beta lactams. Susceptibility testing not routinely performed. Performed at [facility], [address] [place], [place]    Report Status 05/22/2020 FINAL  Final     Labs: Basic Metabolic Panel: Recent Labs  Lab 05/19/20 0345 05/20/20 0920 05/21/20 0434 05/22/20 0913 05/23/20 1124  NA 132* 136 137 137 138  K 4.0 3.9 4.0 4.0 4.1  CL 95* 104 106 103 104  CO2 24 22 23 24 24   GLUCOSE 126* 104* 118* 97 106*  BUN 9 9 9 6 9     CREATININE 0.78 0.75 0.82 0.72 0.97  CALCIUM 8.5* 8.2* 8.4* 8.6* 9.0  MG 2.1  --   --   --   --    Liver Function Tests: Recent Labs  Lab 05/19/20 0345 05/20/20 0920 05/22/20 0913 05/23/20 1124  AST  19 26 27 30   ALT 16 19 23 27   ALKPHOS 50 49 51 55  BILITOT 0.9 1.0 0.9 0.7  PROT 6.1* 5.9* 6.2* 6.9  ALBUMIN 2.7* 2.7* 2.6* 2.9*   CBC: Recent Labs  Lab 05/19/20 0345 05/20/20 0920 05/21/20 0434 05/22/20 0913 05/23/20 1124  WBC 14.2* 10.3 9.4 7.9 9.7  NEUTROABS 11.3*  --   --   --   --   HGB 14.3 13.5 14.1 12.8* 13.2  HCT 41.9 40.7 42.8 38.3* 39.1  MCV 97.9 99.5 101.9* 99.0 97.5  PLT 153 159 186 186 255   CBG: No results for input(s): GLUCAP in the last 168 hours. Hgb A1c No results for input(s): HGBA1C in the last 72 hours. Lipid Profile No results for input(s): CHOL, HDL, LDLCALC, TRIG, CHOLHDL, LDLDIRECT in the last 72 hours. Thyroid function studies No results for input(s): TSH, T4TOTAL, T3FREE, THYROIDAB in the last 72 hours.  Invalid input(s): FREET3 Urinalysis    Component Value Date/Time   COLORURINE COLORLESS (A) 01/08/2020 1922   APPEARANCEUR CLEAR 01/08/2020 1922   LABSPEC 1.002 (L) 01/08/2020 1922   PHURINE 7.0 01/08/2020 1922   GLUCOSEU NEGATIVE 01/08/2020 1922   HGBUR NEGATIVE 01/08/2020 1922   BILIRUBINUR NEGATIVE 01/08/2020 1922   KETONESUR NEGATIVE 01/08/2020 1922   PROTEINUR NEGATIVE 01/08/2020 1922   NITRITE NEGATIVE 01/08/2020 1922   LEUKOCYTESUR NEGATIVE 01/08/2020 1922    FURTHER DISCHARGE INSTRUCTIONS:   Get Medicines reviewed and adjusted: Please take all your medications with you for your next visit with your Primary MD   Laboratory/radiological data: Please request your Primary MD to go over all hospital tests and procedure/radiological results at the follow up, please ask your Primary MD to get all Hospital records sent to his/her office.   In some cases, they will be blood work, cultures and biopsy results pending at the time  of your discharge. Please request that your primary care M.D. goes through all the records of your hospital data and follows up on these results.   Also Note the following: If you experience worsening of your admission symptoms, develop shortness of breath, life threatening emergency, suicidal or homicidal thoughts you must seek medical attention immediately by calling 911 or calling your MD immediately  if symptoms less severe.   You must read complete instructions/literature along with all the possible adverse reactions/side effects for all the Medicines you take and that have been prescribed to you. Take any new Medicines after you have completely understood and accpet all the possible adverse reactions/side effects.    Do not drive when taking Pain medications or sleeping medications (Benzodaizepines)   Do not take more than prescribed Pain, Sleep and Anxiety Medications. It is not advisable to combine anxiety,sleep and pain medications without talking with your primary care practitioner   Special Instructions: If you have smoked or chewed Tobacco  in the last 2 yrs please stop smoking, stop any regular Alcohol  and or any Recreational drug use.   Wear Seat belts while driving.   Please note: You were cared for by a hospitalist during your hospital stay. Once you are discharged, your primary care physician will handle any further medical issues. Please note that NO REFILLS for any discharge medications will be authorized once you are discharged, as it is imperative that you return to your primary care physician (or establish a relationship with a primary care physician if you do not have one) for your post hospital discharge needs so that they can reassess  your need for medications and monitor your lab values.  Time coordinating discharge: 40 minutes  SIGNED:  Marzetta Board, MD, PhD 05/25/2020, 1:26 PM

## 2020-05-26 ENCOUNTER — Telehealth (HOSPITAL_COMMUNITY): Payer: Self-pay | Admitting: Licensed Clinical Social Worker
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
## 2020-05-27 ENCOUNTER — Telehealth: Payer: Self-pay

## 2020-05-27 NOTE — Telephone Encounter (Signed)
Transition Care Management Follow-up Telephone Call Date of discharge and from where: 05/25/2020 [facility] Called pt at [phone] [facility] Phone)   [phone] [facility]) spoke with sister stated pt is not home at this time and will relay the message. Contact info and phone number provided.

## 2020-05-31 ENCOUNTER — Telehealth (HOSPITAL_COMMUNITY): Payer: Self-pay | Admitting: Licensed Clinical Social Worker
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
## 2020-06-01 ENCOUNTER — Telehealth: Payer: Self-pay

## 2020-06-01 ENCOUNTER — Other Ambulatory Visit (HOSPITAL_COMMUNITY): Payer: Self-pay

## 2020-06-01 ENCOUNTER — Telehealth (HOSPITAL_COMMUNITY): Payer: Self-pay | Admitting: Licensed Clinical Social Worker
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
## 2020-06-01 NOTE — Telephone Encounter (Signed)
Left message for patient to remind of missed remote transmission.  

## 2020-06-01 NOTE — Progress Notes (Signed)
Paramedicine Encounter    Patient ID: [name], male    DOB: Mar 14, 1961, 59 y.o.   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 ([place]) 12/30/2019   ICD (implantable cardioverter-defibrillator) in place 15/17/6160   Chronic systolic heart failure ([place]) 08/25/2019   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/12/2017   Coronary artery disease involving native coronary artery of native heart without angina pectoris 12/07/2017    Current Outpatient Medications:    atorvastatin (LIPITOR) 80 MG tablet, Take 1 tablet (80 mg total) by mouth daily at 6 PM., Disp: 90 tablet, Rfl: 3   carvedilol (COREG) 3.125 MG tablet, Take 1 tablet (3.125 mg total) by mouth 2 (two) times daily with a meal., Disp: 60 tablet, Rfl: 0   ezetimibe (ZETIA) 10 MG tablet, Take 1 tablet (10 mg total) by mouth daily., Disp: 30 tablet, Rfl: 6   folic acid (FOLVITE) 1 MG tablet, Take 1 tablet (1 mg total) by mouth daily., Disp: 30 tablet, Rfl: 6   furosemide (LASIX) 40 MG tablet, Take 1 tablet (40 mg total) by mouth daily as needed for fluid., Disp: 15 tablet, Rfl: 6   ivabradine (CORLANOR) 5 MG TABS tablet, Take 1 tablet (5 mg total) by mouth 2 (two) times daily with a meal. (Patient taking differently: Take 2.5 mg by mouth 2 (two) times daily with a meal. ), Disp: 60 tablet, Rfl: 6   levETIRAcetam (KEPPRA) 500 MG tablet, Take 1 tablet (500 mg total) by mouth 2 (two) times daily., Disp: 180 tablet, Rfl: 3   sacubitril-valsartan (ENTRESTO) 24-26 MG, Take 1  tablet by mouth 2 (two) times daily., Disp: 60 tablet, Rfl: 6   spironolactone (ALDACTONE) 25 MG tablet, Take 1 tablet (25 mg total) by mouth daily., Disp: 30 tablet, Rfl: 6   thiamine 100 MG tablet, Take 1 tablet (100 mg total) by mouth daily., Disp: 30 tablet, Rfl: 6 No Known Allergies   Social History   Socioeconomic History   Marital status: Divorced    Spouse name: Not on file   Number of children: Not on file   Years of education: Not on file   Highest education level: Not on file  Occupational History   Not on file  Tobacco Use   Smoking status: Never Smoker   Smokeless tobacco: Never Used  Substance and Sexual Activity   Alcohol use: Yes    Alcohol/week: 14.0 standard drinks    Types: 14 Cans of beer per week    Comment: patient endorses at least 1-2 beers daily (04/2020)   Drug use: No   Sexual activity: Not Currently  Other Topics Concern   Not on file  Social History Narrative   Lives at home with his brother who is crippled. He takes care of his brother.    Right handed    Social Determinants of Health   Financial Resource Strain:    Difficulty of Paying Living Expenses:   Food Insecurity:    Worried About Running Out of Food in the Last Year:    [employer]  of Food in the Last Year:   Transportation Needs:    Freight forwarder (Medical):    Lack of Transportation (Non-Medical):   Physical Activity:    Days of Exercise per Week:    Minutes of Exercise per Session:   Stress:    Feeling of Stress :   Social Connections:    Frequency of Communication with Friends and Family:    Frequency of Social Gatherings with Friends and Family:    Attends Religious Services:    Active Member of Clubs or Organizations:    Attends Banker Meetings:    Marital Status:   Intimate Partner Violence:    Fear of Current or Ex-Partner:    Emotionally Abused:    Physically Abused:    Sexually Abused:     Physical Exam Vitals  reviewed.  Constitutional:      Appearance: He is normal weight.  HENT:     Head: Normocephalic.     Nose: Nose normal.     Mouth/Throat:     Mouth: Mucous membranes are moist.  Eyes:     Pupils: Pupils are equal, round, and reactive to light.  Cardiovascular:     Rate and Rhythm: Normal rate and regular rhythm.     Pulses: Normal pulses.  Pulmonary:     Effort: Pulmonary effort is normal.     Breath sounds: Normal breath sounds.  Abdominal:     General: Abdomen is flat.     Palpations: Abdomen is soft.  Musculoskeletal:        General: Swelling present. Normal range of motion.     Cervical back: Normal range of motion.     Right lower leg: No edema.     Left lower leg: No edema.     Comments: Swelling to left arm.   Skin:    General: Skin is warm and dry.     Capillary Refill: Capillary refill takes less than 2 seconds.  Neurological:     Mental Status: He is alert. Mental status is at baseline.  Psychiatric:        Mood and Affect: Mood normal.    Arrived for home visit for Cecilia who was ambulating around outside of his home with no complaints. Adalid reports he is feeling okay and that his arm is getting better but needs an appointment to follow up with NP Meredeth Ide. Arm was clean, with some swelling, with fresh bandage placed on site. Vitals were obtained. Medications reviewed and confirmed. Pill box filled. Ikeem was reminded of importance of diet and taking medications. He verbalized understanding. Home visit complete.   Refills: Entresto      Future Appointments  Date Time Provider Department Center  06/15/2020  9:00 AM [facility] PA/NP [facility] None  11/29/2020  7:10 AM [facility] DEVICE REMOTES [facility]     ACTION: Home visit completed Next visit planned for one week

## 2020-06-08 ENCOUNTER — Other Ambulatory Visit (HOSPITAL_COMMUNITY): Payer: Self-pay

## 2020-06-08 NOTE — Progress Notes (Signed)
Paramedicine Encounter    Patient ID: [name], male    DOB: 17-Apr-1961, 59 y.o.   MRN: [id number]   Patient Care Team: Gildardo Pounds, NP as PCP - General (Nurse Practitioner) Evans Lance, MD as PCP - Electrophysiology (Cardiology)  Patient Active Problem List   Diagnosis Date Noted  . Left arm cellulitis 05/19/2020  . Essential hypertension 05/18/2020  . Mixed hyperlipidemia 05/18/2020  . Alcohol abuse 05/18/2020  . Cellulitis of left upper extremity 05/18/2020  . Lactic acidosis 05/18/2020  . 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 ICD (implantable cardioverter-defibrillator) in place 08/29/2019  . Chronic systolic heart failure ([place]) 08/25/2019  . 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 Coronary artery disease involving native coronary artery of native heart without angina pectoris 12/07/2017    Current Outpatient Medications:  .  atorvastatin (LIPITOR) 80 MG tablet, Take 1 tablet (80 mg total) by mouth daily at 6 PM., Disp: 90 tablet, Rfl: 3 .  carvedilol (COREG) 3.125 MG tablet, Take 1 tablet (3.125 mg total) by mouth 2 (two) times daily with a meal., Disp: 60 tablet, Rfl: 0 .  ezetimibe (ZETIA) 10 MG tablet, Take 1 tablet (10 mg total) by mouth daily., Disp: 30 tablet, Rfl: 6 .  folic acid (FOLVITE) 1 MG tablet, Take 1 tablet (1 mg total) by mouth daily., Disp: 30 tablet, Rfl: 6 .  furosemide (LASIX) 40 MG tablet, Take 1 tablet (40 mg total) by mouth daily as needed for fluid., Disp: 15 tablet, Rfl: 6 .  ivabradine (CORLANOR) 5 MG TABS tablet, Take 1 tablet (5 mg total) by mouth 2 (two) times daily with a meal. (Patient taking differently: Take 2.5 mg by mouth 2 (two) times daily with a meal. ), Disp: 60 tablet, Rfl: 6 .  levETIRAcetam (KEPPRA) 500 MG tablet, Take 1 tablet (500 mg total) by mouth 2 (two) times daily., Disp: 180 tablet, Rfl: 3 .  sacubitril-valsartan (ENTRESTO) 24-26 MG, Take 1  tablet by mouth 2 (two) times daily., Disp: 60 tablet, Rfl: 6 .  spironolactone (ALDACTONE) 25 MG tablet, Take 1 tablet (25 mg total) by mouth daily., Disp: 30 tablet, Rfl: 6 .  thiamine 100 MG tablet, Take 1 tablet (100 mg total) by mouth daily. (Patient not taking: Reported on 06/08/2020), Disp: 30 tablet, Rfl: 6 No Known Allergies   Social History   Socioeconomic History  . Marital status: Divorced    Spouse name: Not on file  . Number of children: Not on file  . Years of education: Not on file  . Highest education level: Not on file  Occupational History  . Not on file  Tobacco Use  . Smoking status: Never Smoker  . Smokeless tobacco: Never Used  Vaping Use  . Vaping Use: Never used  Substance and Sexual Activity  . Alcohol use: Yes    Alcohol/week: 14.0 standard drinks    Types: 14 Cans of beer per week    Comment: patient endorses at least 1-2 beers daily (04/2020)  . Drug use: No  . Sexual activity: Not Currently  Other Topics Concern  . Not on file  Social History Narrative   Lives at home with his brother who is crippled. He takes care of his brother.    Right handed    Social Determinants of Health   Financial Resource Strain: High Risk  . Difficulty of Paying Living Expenses: Hard  Food Insecurity: No Food  Insecurity  . Worried About Programme researcher, broadcasting/film/video in the Last Year: Never true  . Ran Out of Food in the Last Year: Never true  Transportation Needs: No Transportation Needs  . Lack of Transportation (Medical): No  . Lack of Transportation (Non-Medical): No  Physical Activity:   . Days of Exercise per Week:   . Minutes of Exercise per Session:   Stress:   . Feeling of Stress :   Social Connections:   . Frequency of Communication with Friends and Family:   . Frequency of Social Gatherings with Friends and Family:   . Attends Religious Services:   . Active Member of Clubs or Organizations:   . Attends Banker Meetings:   Marland Kitchen Marital Status:    Intimate Partner Violence:   . Fear of Current or Ex-Partner:   . Emotionally Abused:   Marland Kitchen Physically Abused:   . Sexually Abused:     Physical Exam Vitals reviewed.  HENT:     Head: Normocephalic.     Nose: Nose normal.     Mouth/Throat:     Pharynx: Oropharynx is clear.  Eyes:     Pupils: Pupils are equal, round, and reactive to light.  Cardiovascular:     Rate and Rhythm: Normal rate and regular rhythm.     Pulses: Normal pulses.     Heart sounds: Normal heart sounds.  Pulmonary:     Effort: Pulmonary effort is normal.     Breath sounds: Normal breath sounds.     Comments: Left rib area tenderness from fall 3 days ago.No bruising, No deformity noted.  Chest:     Chest wall: Tenderness present.  Abdominal:     General: Abdomen is flat.     Palpations: Abdomen is soft.  Musculoskeletal:        General: Normal range of motion.     Cervical back: Normal range of motion.     Right lower leg: No edema.     Left lower leg: No edema.     Comments: Left arm wound open with redness and mild swelling, stitches still present.   Skin:    General: Skin is warm and dry.     Capillary Refill: Capillary refill takes less than 2 seconds.  Neurological:     Mental Status: He is alert. Mental status is at baseline.  Psychiatric:        Mood and Affect: Mood normal.     Arrived for home visit for [name] where he was ambulating around his yard. [name] reports 3 days ago he stood up too quickly while drinking beer and he passed out falling striking his head and his left side on the floor. [name] brother [name] reports he was unresponsive for less than 5 seconds and he stood up complaining of his head and ribs hurting. Today he reports his head and ribs hurt. I assessed same with no deformities noted, no bruising or swelling to either noted. Left arm open wound present with swelling and redness to site with stitches still present. I wrapped same with anti-biotic wrap and cling wrap. [name]  was instructed to wash site and to keep it wrapped while would still open. He verbalized understanding. Vitals obtained as noted. [name] denied dizziness, shortness of breath today. Medications verified, pill box filled accordingly.[name] was informed of next weeks appointment at HF clinic. Transportation arranged by [name]. [name] is currently awaiting his new EBT card to arrive. He also has a cell phone but cannot  recall the passcode to get into the phone to make outgoing calls. He also does not recall the correct phone number attached to that phone. [name] attempted listed and previous numbers with no success. Friend of [name]'s stated she may be able to figure out the passcode. I will await same information. Home visit complete. I will see in one week at clinic.   Refills: NONE    Future Appointments  Date Time Provider Department Center  06/15/2020  9:00 AM [facility] PA/NP [facility] None  07/06/2020  9:30 AM Claiborne Rigg, NP [facility] None  11/29/2020  7:10 AM [facility] DEVICE [facility]OTES [facility]     ACTION: Home visit completed Next visit planned for one wee

## 2020-06-14 ENCOUNTER — Telehealth (HOSPITAL_COMMUNITY): Payer: Self-pay | Admitting: Licensed Clinical Social Worker
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
## 2020-06-15 ENCOUNTER — Other Ambulatory Visit: Payer: Self-pay

## 2020-06-15 ENCOUNTER — Other Ambulatory Visit (HOSPITAL_COMMUNITY): Payer: Self-pay

## 2020-06-15 ENCOUNTER — Encounter (HOSPITAL_COMMUNITY): Payer: Self-pay

## 2020-06-15 ENCOUNTER — Ambulatory Visit (HOSPITAL_COMMUNITY)
Admission: RE | Admit: 2020-06-15 | Discharge: 2020-06-15 | Disposition: A | Discharge status: Home / Self Care | Payer: Medicaid Other | Source: Ambulatory Visit | Attending: Internal Medicine | Admitting: Internal Medicine

## 2020-06-15 VITALS — BP 118/62 | HR 64 | Wt 139.0 lb

## 2020-06-15 DIAGNOSIS — I110 Hypertensive heart disease with heart failure: Secondary | ICD-10-CM | POA: Diagnosis not present

## 2020-06-15 DIAGNOSIS — Z9119 Patient's noncompliance with other medical treatment and regimen: Secondary | ICD-10-CM | POA: Insufficient documentation

## 2020-06-15 DIAGNOSIS — Z8674 Personal history of sudden cardiac arrest: Secondary | ICD-10-CM | POA: Insufficient documentation

## 2020-06-15 DIAGNOSIS — G40909 Epilepsy, unspecified, not intractable, without status epilepticus: Secondary | ICD-10-CM | POA: Insufficient documentation

## 2020-06-15 DIAGNOSIS — Z951 Presence of aortocoronary bypass graft: Secondary | ICD-10-CM | POA: Insufficient documentation

## 2020-06-15 DIAGNOSIS — M1990 Unspecified osteoarthritis, unspecified site: Secondary | ICD-10-CM | POA: Insufficient documentation

## 2020-06-15 DIAGNOSIS — F1010 Alcohol abuse, uncomplicated: Secondary | ICD-10-CM | POA: Insufficient documentation

## 2020-06-15 DIAGNOSIS — Z9114 Patient's other noncompliance with medication regimen: Secondary | ICD-10-CM | POA: Diagnosis not present

## 2020-06-15 DIAGNOSIS — R42 Dizziness and giddiness: Secondary | ICD-10-CM | POA: Diagnosis not present

## 2020-06-15 DIAGNOSIS — E782 Mixed hyperlipidemia: Secondary | ICD-10-CM | POA: Diagnosis not present

## 2020-06-15 DIAGNOSIS — I5022 Chronic systolic (congestive) heart failure: Secondary | ICD-10-CM | POA: Diagnosis not present

## 2020-06-15 DIAGNOSIS — Z79899 Other long term (current) drug therapy: Secondary | ICD-10-CM | POA: Diagnosis not present

## 2020-06-15 DIAGNOSIS — Z7901 Long term (current) use of anticoagulants: Secondary | ICD-10-CM | POA: Insufficient documentation

## 2020-06-15 DIAGNOSIS — I252 Old myocardial infarction: Secondary | ICD-10-CM | POA: Insufficient documentation

## 2020-06-15 DIAGNOSIS — Z95810 Presence of automatic (implantable) cardiac defibrillator: Secondary | ICD-10-CM | POA: Diagnosis not present

## 2020-06-15 DIAGNOSIS — I2510 Atherosclerotic heart disease of native coronary artery without angina pectoris: Secondary | ICD-10-CM | POA: Insufficient documentation

## 2020-06-15 DIAGNOSIS — I255 Ischemic cardiomyopathy: Secondary | ICD-10-CM | POA: Diagnosis not present

## 2020-06-15 LAB — BASIC METABOLIC PANEL
Anion gap: 10 (ref 5–15)
BUN: 12 mg/dL (ref 6–20)
CO2: 20 mmol/L — ABNORMAL LOW (ref 22–32)
Calcium: 8.3 mg/dL — ABNORMAL LOW (ref 8.9–10.3)
Chloride: 107 mmol/L (ref 98–111)
Creatinine, Ser: 0.77 mg/dL (ref 0.61–1.24)
GFR calc Af Amer: >60 mL/min (ref >60)
GFR calc non Af Amer: >60 mL/min (ref >60)
Glucose, Bld: 88 mg/dL (ref 70–99)
Potassium: 4 mmol/L (ref 3.5–5.1)
Sodium: 137 mmol/L (ref 135–145)

## 2020-06-15 MED ORDER — CARVEDILOL 3.125 MG PO TABS: 3.1250 mg | ORAL_TABLET | Freq: Two times a day (BID) | ORAL | 2 refills | Status: DC

## 2020-06-15 MED ORDER — ATORVASTATIN CALCIUM 80 MG PO TABS: 80.0000 mg | ORAL_TABLET | Freq: Every day | ORAL | 3 refills | Status: DC

## 2020-06-15 MED FILL — levETIRAcetam 500 MG TABS: 500 | 90 days supply | Qty: 180 | Fill #1

## 2020-06-15 NOTE — Progress Notes (Signed)
ADVANCED HF CLINIC NOTE  PCP: None  Primary Cardiologist: Dr Haroldine Laws   HPI: [name] a 59 y.o.malewith h/o ETOH, VF Arrest, CAD s/p CABG 12/07/17, and ICM with EF 25-30%.  Admitted 12/6 - 12/14/17 with V-Fib arrest while chopping wood. Received CPR in the field. R/LHC with severe 3vD as below and cardiogenic shock requiring IABP. Required milrinone with continued shock. Pt improved and IABP removed 12/10, but pt had recurrent VF arrest so IABP replaced. Stabilized and underwent CABG 12/07/17. Tolerated gradual wean of meds and extubation with no furthe events. HF medications titrated as tolerated.  Admitted 12/27 ->12/28 with syncope and AKI thought to be 2/2 volume depletion on lasix, spiro, and Entresto. CT negative for ICH. Improved rapidly with holding meds and gentle IVF. Entresto stopped. Spiro cut back and switched to low dose losartan.  He had repeat echo 04/2019 that showed persistently reduced LVEF at 25%.    Last clinic visit w/ Dr. Haroldine Laws on 7/22, losartan was discontinued and pt placed back on low dose Entresto (has been tolerating well w/o recurrent syncope/ near syncope). Given EF < 35% despite medical therapy, he was referred to EP and seen by Dr. Lovena Le who recommended ICD implantation.    He was admitted 08/29/19 for implantation of a [employer] single chamber ICD.   He had routine clinic f/u on 10/23/19 and while in exam room had a witnessed seizure with loss of pulse. CPR started with quick ROSC. He did not require shock. Taken to the ED and had another seizure. Neurology consulted. Started on Keppra. HS Trop 10>12.  ICD interrogated. No arrhythmias noted.  From HF perspective he had mild volume overload and had a dose of IV lasix and transitioned back to 40 mg PO daily. His previous HF meds were restarted w/ addition of spironolactone.  He had no recurrent seizures after initiation of Keprra. Neurology recommended continuation of Keppra 500  mg twice a day and he was instructed not to drive.   Had return clinic f/u 11/04/19 and reported full compliance w/ Keppra.  Admitted to [facility] on 12/09/19 with recurrent seizures in setting of noncompliance with his Keppra.   Admitted 01/08/20 after syncopal episode. Had AKI so HF meds stopped. Given IV fluids and started back on coreg, spiro, and lasix. Delene Loll was stopped.  Had return f/u 2/21 and AKI had resolved. Entresto resumed.   He presents to clinic for routine f/u. At last visit in April, corlanor was added 2.5 mg bid as well as Zetia (recent LDL 86 mg/dL on atorvastatin 80 mg).  Here today w/ paramedicine. Continues to drink beer, 2-3 40 oz a day. Appears a bit intoxicated now. Paramedic reports he missed 2 consecutive days of his medications this past Sunday and Monday. Subsequently, Heart Logic index trending up, score 18. No VT.  BP 118/62. Pulse rate 64 bpm. Stable symptoms. NYHA Class II. Has limited transportation and walks to grocery store, not limited much by dyspnea. No chest pain. Notes dizziness if he stands too quickly. Has had several stumbles/ falls but in the setting of drinking.    ROS: All systems negative except as listed in HPI, PMH and Problem List.  SH:  Social History   Socioeconomic History  . Marital status: Divorced    Spouse name: Not on file  . Number of children: Not on file  . Years of education: Not on file  . Highest education level: Not on file  Occupational History  . Not  on file  Tobacco Use  . Smoking status: Never Smoker  . Smokeless tobacco: Never Used  Vaping Use  . Vaping Use: Never used  Substance and Sexual Activity  . Alcohol use: Yes    Alcohol/week: 14.0 standard drinks    Types: 14 Cans of beer per week    Comment: patient endorses at least 1-2 beers daily (04/2020)  . Drug use: No  . Sexual activity: Not Currently  Other Topics Concern  . Not on file  Social History Narrative   Lives at home with his brother  who is crippled. He takes care of his brother.    Right handed    Social Determinants of Health   Financial Resource Strain: High Risk  . Difficulty of Paying Living Expenses: Hard  Food Insecurity: No Food Insecurity  . Worried About Programme researcher, broadcasting/film/video in the Last Year: Never true  . Ran Out of Food in the Last Year: Never true  Transportation Needs: No Transportation Needs  . Lack of Transportation (Medical): No  . Lack of Transportation (Non-Medical): No  Physical Activity:   . Days of Exercise per Week:   . Minutes of Exercise per Session:   Stress:   . Feeling of Stress :   Social Connections:   . Frequency of Communication with Friends and Family:   . Frequency of Social Gatherings with Friends and Family:   . Attends Religious Services:   . Active Member of Clubs or Organizations:   . Attends Banker Meetings:   Marland Kitchen Marital Status:   Intimate Partner Violence:   . Fear of Current or Ex-Partner:   . Emotionally Abused:   Marland Kitchen Physically Abused:   . Sexually Abused:     FH:  Family History  Problem Relation Age of Onset  . Heart attack Other   . Diabetes Neg Hx     Past Medical History:  Diagnosis Date  . AICD (automatic cardioverter/defibrillator) present   . Alcohol abuse 05/18/2020  . Arthritis    "hands; maybe my toes" (12/20/2017)  . CAD (coronary artery disease)   . Cardiac arrest (HCC) 11/29/2017   hx VF arrest/notes 12/20/2017  . Chronic systolic heart failure (HCC) 08/25/2019  . Coronary artery disease   . Coronary artery disease involving native coronary artery of native heart without angina pectoris 12/07/2017  . Essential hypertension 05/18/2020  . GERD (gastroesophageal reflux disease)   . H/O ETOH abuse    /notes 12/20/2017  . High cholesterol   . Hypertension   . Ischemic cardiomyopathy    a. 08/2019 s/p BSX D232 single lead AICD.  Marland Kitchen Mixed hyperlipidemia 05/18/2020  . Seizure (HCC) 10/21/2019  . Seizures (HCC) ~ 2016; ~ 2017    "I've had a couple; I was at work" (12/20/2017)  . 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 Syncope and collapse    "last few days" (12/20/2017)    Current Outpatient Medications  Medication Sig Dispense Refill  . atorvastatin (LIPITOR) 80 MG tablet Take 1 tablet (80 mg total) by mouth daily at 6 PM. 90 tablet 3  . carvedilol (COREG) 3.125 MG tablet Take 1 tablet (3.125 mg total) by mouth 2 (two) times daily with a meal. 60 tablet 0  . ezetimibe (ZETIA) 10 MG tablet Take 1 tablet (10 mg total) by mouth daily. 30 tablet 6  . folic acid (FOLVITE) 1 MG tablet Take 1 tablet (1 mg total) by  mouth daily. 30 tablet 6  . furosemide (LASIX) 40 MG tablet Take 1 tablet (40 mg total) by mouth daily as needed for fluid. 15 tablet 6  . ivabradine (CORLANOR) 5 MG TABS tablet Take 1 tablet (5 mg total) by mouth 2 (two) times daily with a meal. (Patient taking differently: Take 2.5 mg by mouth 2 (two) times daily with a meal. ) 60 tablet 6  . levETIRAcetam (KEPPRA) 500 MG tablet Take 1 tablet (500 mg total) by mouth 2 (two) times daily. 180 tablet 3  . sacubitril-valsartan (ENTRESTO) 24-26 MG Take 1 tablet by mouth 2 (two) times daily. 60 tablet 6  . spironolactone (ALDACTONE) 25 MG tablet Take 1 tablet (25 mg total) by mouth daily. 30 tablet 6  . thiamine 100 MG tablet Take 1 tablet (100 mg total) by mouth daily. 30 tablet 6   No current facility-administered medications for this encounter.    Vitals:   06/15/20 0848  BP: 118/62  Pulse: 64  SpO2: 96%  Weight: 63 kg (139 lb)   Wt Readings from Last 3 Encounters:  06/15/20 63 kg (139 lb)  05/20/20 75 kg (165 lb 5.5 oz)  05/06/20 64.2 kg (141 lb 8 oz)   PHYSICAL EXAM: General:  Thin WM, disheveled looking. No respiratory difficulty HEENT: normal Neck: supple. no JVD. Carotids 2+ bilat; no bruits. No lymphadenopathy or thyromegaly appreciated. Cor: PMI nondisplaced.  Regular rate and rhythm No rubs, gallops or murmurs. Lungs: clear. No wheezing  Abdomen: soft, nontender, nondistended. No hepatosplenomegaly. No bruits or masses. Good bowel sounds. Extremities: no cyanosis, clubbing, rash, edema Neuro: alert & oriented x 3, cranial nerves grossly intact. moves all 4 extremities w/o difficulty. Affect pleasant. Appears slightly intoxicated.   ASSESSMENT & PLAN:  1. Seizure: recent diagnosis 09/2019.  - On Keppra 500 mg bid and reports full compliance. Denies any recent seizures  - Followed by Neurology.   2. Chronic systolic CHF with ICM - TTE 11/30/17 25-30% RV normal. Echo 03/2018: EF40-45%RV normal.  - ECHO 5/20 EF ~ 25%. Has ICD.  - ECHO 12/2019 EF 30-35% RV norma; - NYHA II. Volume status trending up slightly in the setting of recent poor compliance w/ home meds/ diuretics. Missed 2 consecutive days of meds this week. Heart logic curve trending up but has not crossed threshold. Discussed importance of strict compliance.  - Continue PRN lasix 20 mg  - Continue Spironolactone 25 mg daily  -Continue Coreg 3.125 mg twice a day.  - Continue Entresto 24-26 mg twice a day. - Continue Corlanor for HR, 2.5 mg bid. HR improved in the 60s today. EKG shows NSR - Check BMP today  - Not a candidate for advanced therapies due to ongoing alcohol abuse.  3. CAD s/p CABG 12/07/2017 - denies chest pain chest pain. No exertional symptoms  -ContinueASA,  blocker, atorvastatin +zetia.  4. H/o VF arrest in setting of STEMI - has ICD, followed by EP - device interrogation reveals no VT   5. ETOH abuse  - still drinking ~2-3 40 oz beers daily.  - Discussed alcohol cessation. He has no plans to quit.   6. H/O Noncompliance/ Poor Health Literacy  - Followed by HF Paramedicine.    - discussed importance of strict compliance w/ meds  7. HLD: - recent Lipid panel 3/21 showed LDL 86 mg/dL on atorvastatin 80 - Zetia added last visit - not fasting today,  will obtain repeat FLP at next f/u appt   F/u w/ Dr. Gala Romney  in 3 months, or sooner if needed. Paramedicine to continue to follow closely   Robbie Lis PA-C  9:19 AM

## 2020-06-15 NOTE — Progress Notes (Signed)
Met with [name] in the clinic this morning where he was alert and oriented reporting he has been feeling dizzy when standing after drinking beer. [name] was compliant with most medications over the the last week. Vitals were obtained and are as noted. EKG was obtained by EKG tech. Lab work obtained. PA Brittany Simmons met with patient and stated his fluid levels were trending upward but not enough to make any medication changes today. I will continue to monitor patient weekly on Tuesdays. Medications were reviewed. Pill box filled. Visit completed.  ° °Refills: Keppra  °

## 2020-06-15 NOTE — Patient Instructions (Signed)
It was great to see you today! No medication changes are needed at this time.  Labs today We will only contact you if something comes back abnormal or we need to make some changes. Otherwise no news is good news!  Your physician recommends that you schedule a follow-up appointment in: 3 months with Dr Gala Romney  Do the following things EVERYDAY: 1) Weigh yourself in the morning before breakfast. Write it down and keep it in a log. 2) Take your medicines as prescribed 3) Eat low salt foods--Limit salt (sodium) to 2000 mg per day.  4) Stay as active as you can everyday 5) Limit all fluids for the day to less than 2 liters  At the [facility], you and your health needs are our priority. As part of our continuing mission to provide you with exceptional heart care, we have created designated Provider Care Teams. These Care Teams include your primary Cardiologist (physician) and Advanced Practice Providers (APPs- Physician Assistants and Nurse Practitioners) who all work together to provide you with the care you need, when you need it.   You may see any of the following providers on your designated Care Team at your next follow up: Marland Kitchen Dr Arvilla Meres . Dr Marca Ancona . Tonye Becket, NP . Robbie Lis, PA . Karle Plumber, PharmD   Please be sure to bring in all your medications bottles to every appointment.

## 2020-06-15 NOTE — Progress Notes (Addendum)
CSW met with pt during clinic visit to check in regarding multiple social concerns.  Pt able to confirm that he received his new food stamps card that we ordered and has been able to successfully buying food with it.  Also states he has now gotten a new debit card- CSW encouraged him to no lend out his card to anyone or share his pin number to avoid his funds being stolen in the future.  Pt continues to not have a phone- paramedic made sure that he had our numbers and encouraged him to borrow someones phone or go nextdoor to the barbers shop to borrow their phone if he needs anything- pt expressed understanding.  CSW also called [facility] and arranged transport to pt [facility] appt on 7/13.  Will also plan to set up [facility] transport for 8/31 appt with our clinic closer to that date as [facility] does not schedule that far out  CSW will continue to follow and assist as needed  [name], [facility] Desk#: [phone] Cell#: [phone]

## 2020-06-22 ENCOUNTER — Other Ambulatory Visit (HOSPITAL_COMMUNITY): Payer: Self-pay

## 2020-06-22 MED FILL — ATORVASTATIN 80 MG TABLET: 80 | 90 days supply | Qty: 90 | Fill #0

## 2020-06-22 MED FILL — CARVEDILOL 6.25 MG TABLET: 6.25 | 90 days supply | Qty: 180 | Fill #0

## 2020-06-22 MED FILL — CARVEDILOL 3.125 MG TABLET: 3.125 | 30 days supply | Qty: 60 | Fill #0

## 2020-06-22 MED FILL — EZETIMIBE 10 MG TABS: 10 | 30 days supply | Qty: 30 | Fill #2

## 2020-06-22 NOTE — Progress Notes (Signed)
Paramedicine Encounter    Patient ID: [name], male    DOB: 09-28-1961, 59 y.o.   MRN: [id number]   Patient Care Team: Claiborne Rigg, NP as PCP - General (Nurse Practitioner) Marinus Maw, MD as PCP - Electrophysiology (Cardiology)  Patient Active Problem List   Diagnosis Date Noted  . Left arm cellulitis 05/19/2020  . Essential hypertension 05/18/2020  . Mixed hyperlipidemia 05/18/2020  . Alcohol abuse 05/18/2020  . Cellulitis of left upper extremity 05/18/2020  . Lactic acidosis 05/18/2020  . 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 ICD (implantable cardioverter-defibrillator) in place 08/29/2019  . Chronic systolic heart failure (HCC) 08/25/2019  . 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 Coronary artery disease involving native coronary artery of native heart without angina pectoris 12/07/2017    Current Outpatient Medications:  .  atorvastatin (LIPITOR) 80 MG tablet, Take 1 tablet (80 mg total) by mouth daily at 6 PM., Disp: 90 tablet, Rfl: 3 .  carvedilol (COREG) 3.125 MG tablet, Take 1 tablet (3.125 mg total) by mouth 2 (two) times daily with a meal., Disp: 60 tablet, Rfl: 2 .  ezetimibe (ZETIA) 10 MG tablet, Take 1 tablet (10 mg total) by mouth daily., Disp: 30 tablet, Rfl: 6 .  folic acid (FOLVITE) 1 MG tablet, Take 1 tablet (1 mg total) by mouth daily., Disp: 30 tablet, Rfl: 6 .  furosemide (LASIX) 40 MG tablet, Take 1 tablet (40 mg total) by mouth daily as needed for fluid., Disp: 15 tablet, Rfl: 6 .  ivabradine (CORLANOR) 5 MG TABS tablet, Take 1 tablet (5 mg total) by mouth 2 (two) times daily with a meal. (Patient taking differently: Take 2.5 mg by mouth 2 (two) times daily with a meal. ), Disp: 60 tablet, Rfl: 6 .  levETIRAcetam (KEPPRA) 500 MG tablet, Take 1 tablet (500 mg total) by mouth 2 (two) times daily., Disp: 180 tablet, Rfl: 3 .  sacubitril-valsartan (ENTRESTO) 24-26 MG, Take 1  tablet by mouth 2 (two) times daily., Disp: 60 tablet, Rfl: 6 .  spironolactone (ALDACTONE) 25 MG tablet, Take 1 tablet (25 mg total) by mouth daily., Disp: 30 tablet, Rfl: 6 .  thiamine 100 MG tablet, Take 1 tablet (100 mg total) by mouth daily., Disp: 30 tablet, Rfl: 6 No Known Allergies   Social History   Socioeconomic History  . Marital status: Divorced    Spouse name: Not on file  . Number of children: Not on file  . Years of education: Not on file  . Highest education level: Not on file  Occupational History  . Not on file  Tobacco Use  . Smoking status: Never Smoker  . Smokeless tobacco: Never Used  Vaping Use  . Vaping Use: Never used  Substance and Sexual Activity  . Alcohol use: Yes    Alcohol/week: 14.0 standard drinks    Types: 14 Cans of beer per week    Comment: patient endorses at least 1-2 beers daily (04/2020)  . Drug use: No  . Sexual activity: Not Currently  Other Topics Concern  . Not on file  Social History Narrative   Lives at home with his brother who is crippled. He takes care of his brother.    Right handed    Social Determinants of Health   Financial Resource Strain: High Risk  . Difficulty of Paying Living Expenses: Hard  Food Insecurity: No Food Insecurity  . Worried About Running  Out of Food in the Last Year: Never true  . Ran Out of Food in the Last Year: Never true  Transportation Needs: No Transportation Needs  . Lack of Transportation (Medical): No  . Lack of Transportation (Non-Medical): No  Physical Activity:   . Days of Exercise per Week:   . Minutes of Exercise per Session:   Stress:   . Feeling of Stress :   Social Connections:   . Frequency of Communication with Friends and Family:   . Frequency of Social Gatherings with Friends and Family:   . Attends Religious Services:   . Active Member of Clubs or Organizations:   . Attends Banker Meetings:   Marland Kitchen Marital Status:   Intimate Partner Violence:   . Fear of  Current or Ex-Partner:   . Emotionally Abused:   Marland Kitchen Physically Abused:   . Sexually Abused:     Physical Exam Vitals reviewed.  Constitutional:      Appearance: He is normal weight.  HENT:     Head: Normocephalic.     Mouth/Throat:     Mouth: Mucous membranes are moist.     Pharynx: Oropharynx is clear.  Eyes:     Pupils: Pupils are equal, round, and reactive to light.  Cardiovascular:     Rate and Rhythm: Normal rate and regular rhythm.     Pulses: Normal pulses.     Heart sounds: Normal heart sounds.  Pulmonary:     Effort: Pulmonary effort is normal.     Breath sounds: Normal breath sounds.  Abdominal:     General: Abdomen is flat.     Palpations: Abdomen is soft.  Musculoskeletal:        General: Normal range of motion.     Cervical back: Normal range of motion.     Right lower leg: No edema.     Left lower leg: No edema.  Skin:    General: Skin is warm and dry.     Capillary Refill: Capillary refill takes less than 2 seconds.  Neurological:     Mental Status: He is alert. Mental status is at baseline.  Psychiatric:        Mood and Affect: Mood normal.     Arrived for home visit for [name] today who greeted me outside on the porch alert and oriented reporting he feels pretty good and denied any shortness of breath, dizziness, chest pain or recent syncopal episodes or falls. [name] was compliant with medications for the last week. Vitals were obtained and are as noted. Medications were reviewed and confirmed. Pill box filled accordingly. [name] still had stiches in right arm, I removed same and cleaned site. Patient was appreciative. Site healed very nicely. [name] agreed to visit in one week. No upcoming appointments. Home visit complete.  Refills:  Carvedilol Atorvastatin Zetia     Future Appointments  Date Time Provider Department Center  07/06/2020  9:30 AM Claiborne Rigg, NP [facility] None  08/24/2020 10:40 AM [name], Bevelyn Buckles, MD [facility] None   11/29/2020  7:10 AM [facility] DEVICE REMOTES [facility]     ACTION: Home visit completed Next visit planned for one week

## 2020-06-29 ENCOUNTER — Other Ambulatory Visit (HOSPITAL_COMMUNITY): Payer: Self-pay

## 2020-06-29 NOTE — Progress Notes (Signed)
Paramedicine Encounter    Patient ID: [name], male    DOB: 09-28-1961, 59 y.o.   MRN: [id number]   Patient Care Team: Claiborne Rigg, NP as PCP - General (Nurse Practitioner) Marinus Maw, MD as PCP - Electrophysiology (Cardiology)  Patient Active Problem List   Diagnosis Date Noted  . Left arm cellulitis 05/19/2020  . Essential hypertension 05/18/2020  . Mixed hyperlipidemia 05/18/2020  . Alcohol abuse 05/18/2020  . Cellulitis of left upper extremity 05/18/2020  . Lactic acidosis 05/18/2020  . 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 ICD (implantable cardioverter-defibrillator) in place 08/29/2019  . Chronic systolic heart failure (HCC) 08/25/2019  . 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 Coronary artery disease involving native coronary artery of native heart without angina pectoris 12/07/2017    Current Outpatient Medications:  .  atorvastatin (LIPITOR) 80 MG tablet, Take 1 tablet (80 mg total) by mouth daily at 6 PM., Disp: 90 tablet, Rfl: 3 .  carvedilol (COREG) 3.125 MG tablet, Take 1 tablet (3.125 mg total) by mouth 2 (two) times daily with a meal., Disp: 60 tablet, Rfl: 2 .  ezetimibe (ZETIA) 10 MG tablet, Take 1 tablet (10 mg total) by mouth daily., Disp: 30 tablet, Rfl: 6 .  folic acid (FOLVITE) 1 MG tablet, Take 1 tablet (1 mg total) by mouth daily., Disp: 30 tablet, Rfl: 6 .  furosemide (LASIX) 40 MG tablet, Take 1 tablet (40 mg total) by mouth daily as needed for fluid., Disp: 15 tablet, Rfl: 6 .  ivabradine (CORLANOR) 5 MG TABS tablet, Take 1 tablet (5 mg total) by mouth 2 (two) times daily with a meal. (Patient taking differently: Take 2.5 mg by mouth 2 (two) times daily with a meal. ), Disp: 60 tablet, Rfl: 6 .  levETIRAcetam (KEPPRA) 500 MG tablet, Take 1 tablet (500 mg total) by mouth 2 (two) times daily., Disp: 180 tablet, Rfl: 3 .  sacubitril-valsartan (ENTRESTO) 24-26 MG, Take 1  tablet by mouth 2 (two) times daily., Disp: 60 tablet, Rfl: 6 .  spironolactone (ALDACTONE) 25 MG tablet, Take 1 tablet (25 mg total) by mouth daily., Disp: 30 tablet, Rfl: 6 .  thiamine 100 MG tablet, Take 1 tablet (100 mg total) by mouth daily., Disp: 30 tablet, Rfl: 6 No Known Allergies   Social History   Socioeconomic History  . Marital status: Divorced    Spouse name: Not on file  . Number of children: Not on file  . Years of education: Not on file  . Highest education level: Not on file  Occupational History  . Not on file  Tobacco Use  . Smoking status: Never Smoker  . Smokeless tobacco: Never Used  Vaping Use  . Vaping Use: Never used  Substance and Sexual Activity  . Alcohol use: Yes    Alcohol/week: 14.0 standard drinks    Types: 14 Cans of beer per week    Comment: patient endorses at least 1-2 beers daily (04/2020)  . Drug use: No  . Sexual activity: Not Currently  Other Topics Concern  . Not on file  Social History Narrative   Lives at home with his brother who is crippled. He takes care of his brother.    Right handed    Social Determinants of Health   Financial Resource Strain: High Risk  . Difficulty of Paying Living Expenses: Hard  Food Insecurity: No Food Insecurity  . Worried About Running  Out of Food in the Last Year: Never true  . Ran Out of Food in the Last Year: Never true  Transportation Needs: No Transportation Needs  . Lack of Transportation (Medical): No  . Lack of Transportation (Non-Medical): No  Physical Activity:   . Days of Exercise per Week:   . Minutes of Exercise per Session:   Stress:   . Feeling of Stress :   Social Connections:   . Frequency of Communication with Friends and Family:   . Frequency of Social Gatherings with Friends and Family:   . Attends Religious Services:   . Active Member of Clubs or Organizations:   . Attends Banker Meetings:   Marland Kitchen Marital Status:   Intimate Partner Violence:   . Fear of  Current or Ex-Partner:   . Emotionally Abused:   Marland Kitchen Physically Abused:   . Sexually Abused:     Physical Exam Vitals reviewed.  Constitutional:      Appearance: He is normal weight.  HENT:     Head: Normocephalic.     Nose: Nose normal.     Mouth/Throat:     Mouth: Mucous membranes are moist.  Eyes:     Pupils: Pupils are equal, round, and reactive to light.  Cardiovascular:     Rate and Rhythm: Normal rate and regular rhythm.     Pulses: Normal pulses.     Heart sounds: Normal heart sounds.  Pulmonary:     Effort: Pulmonary effort is normal.     Breath sounds: Normal breath sounds.  Abdominal:     General: Abdomen is flat.     Palpations: Abdomen is soft.  Musculoskeletal:        General: Normal range of motion.     Cervical back: Normal range of motion.     Right lower leg: No edema.     Left lower leg: No edema.  Skin:    General: Skin is warm and dry.     Capillary Refill: Capillary refill takes less than 2 seconds.  Neurological:     Mental Status: He is alert. Mental status is at baseline.  Psychiatric:        Mood and Affect: Mood normal.     Arrived for home visit for [facility] where he was seated on the porch alert and oriented reporting he has been feeling fine with no complaints lately. Vitals were obtained. Ritesh missed 6 doses of medications last week. Medications were verified and confirmed. 2 pill boxes were filled accordingly due to me not being here next week. Devonn was educated and instructed how to take same he verbalized understanding. Britney was also made aware of appointment next week. He understood. Dekker understood I would be back in two weeks. Home visit complete.   Refills:  -Atorvastatin -Carvedilol -Corlanor -Zetia -Spironolactone -Entresto     Future Appointments  Date Time Provider Department Center  07/06/2020  9:30 AM Claiborne Rigg, NP [facility] None  08/24/2020 10:40 AM [name], Bevelyn Buckles, MD [facility] None  11/29/2020  7:10 AM  [facility] DEVICE REMOTES [facility]     ACTION: Home visit completed Next visit planned for two weeks

## 2020-07-06 ENCOUNTER — Ambulatory Visit: Payer: Medicaid Other | Attending: Nurse Practitioner | Admitting: Nurse Practitioner

## 2020-07-06 ENCOUNTER — Other Ambulatory Visit: Payer: Self-pay

## 2020-07-06 ENCOUNTER — Other Ambulatory Visit (HOSPITAL_COMMUNITY): Payer: Self-pay | Admitting: Adult Health

## 2020-07-06 ENCOUNTER — Encounter: Payer: Self-pay | Admitting: Nurse Practitioner

## 2020-07-06 ENCOUNTER — Other Ambulatory Visit (HOSPITAL_COMMUNITY): Payer: Self-pay | Admitting: *Deleted

## 2020-07-06 VITALS — BP 108/73 | HR 68 | Temp 97.7°F | Ht 70.0 in | Wt 139.0 lb

## 2020-07-06 DIAGNOSIS — G40409 Other generalized epilepsy and epileptic syndromes, not intractable, without status epilepticus: Secondary | ICD-10-CM | POA: Diagnosis not present

## 2020-07-06 DIAGNOSIS — R7303 Prediabetes: Secondary | ICD-10-CM | POA: Diagnosis not present

## 2020-07-06 DIAGNOSIS — Z1211 Encounter for screening for malignant neoplasm of colon: Secondary | ICD-10-CM

## 2020-07-06 DIAGNOSIS — E782 Mixed hyperlipidemia: Secondary | ICD-10-CM

## 2020-07-06 DIAGNOSIS — Z125 Encounter for screening for malignant neoplasm of prostate: Secondary | ICD-10-CM | POA: Diagnosis not present

## 2020-07-06 DIAGNOSIS — I10 Essential (primary) hypertension: Secondary | ICD-10-CM

## 2020-07-06 DIAGNOSIS — I469 Cardiac arrest, cause unspecified: Secondary | ICD-10-CM

## 2020-07-06 HISTORY — DX: Cardiac arrest, cause unspecified: I46.9

## 2020-07-06 LAB — GLUCOSE, POCT (MANUAL RESULT ENTRY): POC Glucose: 86 mg/dl (ref 70–99)

## 2020-07-06 MED ORDER — EZETIMIBE 10 MG PO TABS: 10.0000 mg | ORAL_TABLET | Freq: Every day | ORAL | 6 refills | Status: DC

## 2020-07-06 MED ORDER — CARVEDILOL 3.125 MG PO TABS: 3.1250 mg | ORAL_TABLET | Freq: Two times a day (BID) | ORAL | 2 refills | Status: DC

## 2020-07-06 MED ORDER — LEVETIRACETAM 500 MG PO TABS: 500.0000 mg | ORAL_TABLET | Freq: Two times a day (BID) | ORAL | 3 refills | Status: DC

## 2020-07-06 MED ORDER — ASPIRIN EC 81 MG PO TBEC: 81.0000 mg | DELAYED_RELEASE_TABLET | Freq: Every day | ORAL | 11 refills | Status: DC

## 2020-07-06 MED ORDER — BLOOD PRESSURE MONITOR DEVI: 0 refills | Status: DC

## 2020-07-06 MED FILL — ATORVASTATIN 80 MG TABLET: 80 | 90 days supply | Qty: 90 | Fill #1

## 2020-07-06 NOTE — Progress Notes (Signed)
Assessment & Plan:  [name] was seen today for follow-up.  Diagnoses and all orders for this visit:  Essential hypertension -     Blood Pressure Monitor DEVI; Please provide patient with insurance approved blood pressure monitor. I10.0 Continue all antihypertensives as prescribed.  Remember to bring in your blood pressure log with you for your follow up appointment.  DASH/Mediterranean Diets are healthier choices for HTN.   Prediabetes -     Glucose (CBG)  Mixed hyperlipidemia -     ezetimibe (ZETIA) 10 MG tablet; Take 1 tablet (10 mg total) by mouth daily. INSTRUCTIONS: Work on a low fat, heart healthy diet and participate in regular aerobic exercise program by working out at least 150 minutes per week; 5 days a week-30 minutes per day. Avoid red meat/beef/steak,  fried foods. junk foods, sodas, sugary drinks, unhealthy snacking, alcohol and smoking.  Drink at least 80 oz of water per day and monitor your carbohydrate intake daily.   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  Cardiac arrest, cause unspecified (HCC) -     aspirin EC 81 MG tablet; Take 1 tablet (81 mg total) by mouth daily. Swallow whole.    Patient has been counseled on age-appropriate routine health concerns for screening and prevention. These are reviewed and up-to-date. Referrals have been placed accordingly. Immunizations are up-to-date or declined.    Subjective:   Chief Complaint  Patient presents with   Follow-up    Pt. is here for a follow up.    HPI [name] 59 y.o. male presents to office today for follow up. He has been followed by Dr. Gala Romney with cardiology as well as the outpatient care medicine program.   Past medical history is significant for CAD status post CABG 12/07/2017, ICM with EF 25 to 30%, seizures (followed by Dr. Lucia Gaskins), EtOH abuse, V. fib arrest, hypertension and dyslipidemia. Continues on driving restrictions.   Essential Hypertension Well controlled. Taking carvedilol 3.125 mg BID, entresto 24-26 mg twice daily, spironolactone 25 mg daily and furosemide 40 mg daily as needed. Denies chest pain, shortness of breath, palpitations, lightheadedness, dizziness, headaches or BLE edema.  BP Readings from Last 3 Encounters:  07/06/20 108/73  06/29/20 122/60  06/22/20 110/62   Lab Results  Component Value Date   LDLCALC 86 03/09/2020    Review of Systems  Constitutional: Negative for fever, malaise/fatigue and weight loss.  HENT: Negative.  Negative for nosebleeds.   Eyes: Negative.  Negative for blurred vision, double vision and photophobia.  Respiratory: Negative.  Negative for cough and shortness of breath.   Cardiovascular: Negative.  Negative for chest pain, palpitations and leg swelling.  Gastrointestinal: Negative.  Negative for heartburn, nausea and vomiting.  Musculoskeletal: Negative.  Negative for myalgias.  Neurological: Negative.  Negative for dizziness, focal weakness, seizures and headaches.  Psychiatric/Behavioral: Negative.  Negative for suicidal ideas.    Past Medical History:  Diagnosis Date   AICD (automatic cardioverter/defibrillator) present    Alcohol abuse 05/18/2020   Arthritis    "hands; maybe my toes" (12/20/2017)   CAD (coronary artery disease)    Cardiac arrest (HCC) 11/29/2017   hx VF arrest/notes 12/20/2017   Chronic systolic  heart failure (HCC) 08/25/2019   Coronary artery disease    Coronary artery disease involving native coronary artery of native heart without angina pectoris 12/07/2017   Essential hypertension 05/18/2020   GERD (gastroesophageal reflux disease)    H/O ETOH abuse    /notes 12/20/2017   High cholesterol     Hypertension    Ischemic cardiomyopathy    a. 08/2019 s/p BSX D232 single lead AICD.   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) 12/30/2019   Syncope and collapse    "last few days" (12/20/2017)    Past Surgical History:  Procedure Laterality Date   CARDIAC CATHETERIZATION     CORONARY ARTERY BYPASS GRAFT N/A 12/07/2017   Procedure: CORONARY ARTERY BYPASS GRAFTING (CABG) times four using left internal mammary artery and right saphenous vein using endoscope for harvest.;  Surgeon: [name], MD;  Location: [facility] OR;  Service: Open Heart Surgery;  Laterality: N/A;   I & D EXTREMITY Left 05/20/2020   Procedure: IRRIGATION AND DEBRIDEMENT OF WRIST;  Surgeon: Ernest Mallick, MD;  Location: [facility] OR;  Service: Orthopedics;  Laterality: Left;   IABP INSERTION N/A 11/29/2017   Procedure: IABP Insertion;  Surgeon: [name], MD;  Location: [facility] INVASIVE CV LAB;  Service: Cardiovascular;  Laterality: N/A;   IABP INSERTION N/A 12/03/2017   Procedure: IABP INSERTION;  Surgeon: [name], MD;  Location: [facility] INVASIVE CV LAB;  Service: Cardiovascular;  Laterality: N/A;   ICD IMPLANT N/A 08/29/2019   Procedure: ICD IMPLANT;  Surgeon: Marinus Maw, MD;  Location[facility] INVASIVE CV LAB;  Service: Cardiovascular;  Laterality: N/A;   LEFT HEART CATH AND CORONARY ANGIOGRAPHY N/A 11/29/2017   Procedure: LEFT HEART CATH AND CORONARY ANGIOGRAPHY;  Surgeon: [name], MD;  Location: [facility] INVASIVE CV LAB;  Service: Cardiovascular;  Laterality: N/A;   RIGHT HEART CATH N/A 11/29/2017   Procedure: RIGHT HEART CATH;  Surgeon: [name], MD;  Location: [facility] INVASIVE CV LAB;  Service: Cardiovascular;  Laterality: N/A;   TEE WITHOUT CARDIOVERSION N/A 12/07/2017    Procedure: TRANSESOPHAGEAL ECHOCARDIOGRAM (TEE);  Surgeon: Donata Clay, Theron Arista, MD;  Location: [facility] OR;  Service: Open Heart Surgery;  Laterality: N/A;   TONSILLECTOMY AND ADENOIDECTOMY  ~ 1972    Family History  Problem Relation Age of Onset   Heart attack Other    Diabetes Neg Hx     Social History Reviewed with no changes to be made today.   Outpatient Medications Prior to Visit  Medication Sig Dispense Refill   atorvastatin (LIPITOR) 80 MG tablet Take 1 tablet (80 mg total) by mouth daily at 6 PM. 90 tablet 3   carvedilol (COREG) 3.125 MG tablet Take 1 tablet (3.125 mg total) by mouth 2 (two) times daily with a meal. 60 tablet 2   furosemide (LASIX) 40 MG tablet Take 1 tablet (40 mg total) by mouth daily as needed for fluid. 15 tablet 6   ivabradine (CORLANOR) 5 MG TABS tablet Take 1 tablet (5 mg total) by mouth 2 (two) times daily with a meal. (Patient taking differently: Take 2.5 mg by mouth 2 (two) times daily with a meal. ) 60 tablet 6   sacubitril-valsartan (ENTRESTO) 24-26 MG Take 1 tablet by mouth 2 (two) times daily. 60 tablet 6  spironolactone (ALDACTONE) 25 MG tablet Take 1 tablet (25 mg total) by mouth daily. 30 tablet 6   thiamine 100 MG tablet Take 1 tablet (100 mg total) by mouth daily. 30 tablet 6   ezetimibe (ZETIA) 10 MG tablet Take 1 tablet (10 mg total) by mouth daily. 30 tablet 6   levETIRAcetam (KEPPRA) 500 MG tablet Take 1 tablet (500 mg total) by mouth 2 (two) times daily. 180 tablet 3   folic acid (FOLVITE) 1 MG tablet Take 1 tablet (1 mg total) by mouth daily. 30 tablet 6   No facility-administered medications prior to visit.    No Known Allergies     Objective:    BP 108/73 (BP Location: Left Arm, Patient Position: Sitting, Cuff Size: Normal)    Pulse 68    Temp 97.7 F (36.5 C) (Temporal)    Ht 5\' 10"  (1.778 m)    Wt 139 lb (63 kg)    SpO2 100%    BMI 19.94 kg/m  Wt Readings from Last 3 Encounters:  07/06/20 139 lb (63 kg)  06/29/20 140 lb  (63.5 kg)  06/22/20 142 lb 12.8 oz (64.8 kg)    Physical Exam Vitals and nursing note reviewed.  Constitutional:      Appearance: He is well-developed.  HENT:     Head: Normocephalic and atraumatic.  Cardiovascular:     Rate and Rhythm: Normal rate.     Heart sounds: Normal heart sounds. No murmur heard.  No friction rub. No gallop.   Pulmonary:     Effort: Pulmonary effort is normal. No tachypnea or respiratory distress.     Breath sounds: Normal breath sounds. No decreased breath sounds, wheezing, rhonchi or rales.  Chest:     Chest wall: No tenderness.  Abdominal:     General: Bowel sounds are normal.     Palpations: Abdomen is soft.  Musculoskeletal:        General: Normal range of motion.     Cervical back: Normal range of motion.  Skin:    General: Skin is warm and dry.  Neurological:     Mental Status: He is alert and oriented to person, place, and time.     Coordination: Coordination normal.  Psychiatric:        Behavior: Behavior normal. Behavior is cooperative.        Thought Content: Thought content normal.        Judgment: Judgment normal.          Patient has been counseled extensively about nutrition and exercise as well as the importance of adherence with medications and regular follow-up. The patient was given clear instructions to go to ER or return to medical center if symptoms don't improve, worsen or new problems develop. The patient verbalized understanding.   Follow-up: Return in about 3 months (around 10/06/2020) for TELE .   10/08/2020, FNP-BC [facility] and [facility] [place], [place]   07/06/2020, 11:43 AM

## 2020-07-07 LAB — PSA: Prostate Specific Ag, Serum: 0.8 ng/mL (ref 0.0–4.0)

## 2020-07-07 MED FILL — CARVEDILOL 3.125 MG TABLET: 3.125 | 30 days supply | Qty: 60 | Fill #0

## 2020-07-13 ENCOUNTER — Other Ambulatory Visit (HOSPITAL_COMMUNITY): Payer: Self-pay | Admitting: Adult Health

## 2020-07-13 ENCOUNTER — Other Ambulatory Visit (HOSPITAL_COMMUNITY): Payer: Self-pay

## 2020-07-13 NOTE — Progress Notes (Signed)
Paramedicine Encounter    Patient ID: [name], male    DOB: 1961/04/23, 59 y.o.   MRN: [id number]   Patient Care Team: Claiborne Rigg, NP as PCP - General (Nurse Practitioner) Marinus Maw, MD as PCP - Electrophysiology (Cardiology)  Patient Active Problem List   Diagnosis Date Noted  . Cardiac arrest, cause unspecified (HCC) 07/06/2020  . Left arm cellulitis 05/19/2020  . Essential hypertension 05/18/2020  . Mixed hyperlipidemia 05/18/2020  . Alcohol abuse 05/18/2020  . Cellulitis of left upper extremity 05/18/2020  . Lactic acidosis 05/18/2020  . 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 ICD (implantable cardioverter-defibrillator) in place 08/29/2019  . Chronic systolic heart failure (HCC) 08/25/2019  . 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 Coronary artery disease involving native coronary artery of native heart without angina pectoris 12/07/2017    Current Outpatient Medications:  .  aspirin EC 81 MG tablet, Take 1 tablet (81 mg total) by mouth daily. Swallow whole., Disp: 30 tablet, Rfl: 11 .  atorvastatin (LIPITOR) 80 MG tablet, Take 1 tablet (80 mg total) by mouth daily at 6 PM., Disp: 90 tablet, Rfl: 3 .  Blood Pressure Monitor DEVI, Please provide patient with insurance approved blood pressure monitor. I10.0, Disp: 1 each, Rfl: 0 .  carvedilol (COREG) 3.125 MG tablet, Take 1 tablet (3.125 mg total) by mouth 2 (two) times daily with a meal., Disp: 60 tablet, Rfl: 2 .  ezetimibe (ZETIA) 10 MG tablet, Take 1 tablet (10 mg total) by mouth daily., Disp: 30 tablet, Rfl: 6 .  folic acid (FOLVITE) 1 MG tablet, Take 1 tablet (1 mg total) by mouth daily., Disp: 30 tablet, Rfl: 6 .  furosemide (LASIX) 40 MG tablet, Take 1 tablet (40 mg total) by mouth daily as needed for fluid., Disp: 15 tablet, Rfl: 6 .  ivabradine (CORLANOR) 5 MG TABS tablet, Take 1 tablet (5 mg total) by mouth 2 (two) times daily  with a meal. (Patient taking differently: Take 2.5 mg by mouth 2 (two) times daily with a meal. ), Disp: 60 tablet, Rfl: 6 .  levETIRAcetam (KEPPRA) 500 MG tablet, Take 1 tablet (500 mg total) by mouth 2 (two) times daily., Disp: 180 tablet, Rfl: 3 .  sacubitril-valsartan (ENTRESTO) 24-26 MG, Take 1 tablet by mouth 2 (two) times daily., Disp: 60 tablet, Rfl: 6 .  spironolactone (ALDACTONE) 25 MG tablet, Take 1 tablet (25 mg total) by mouth daily., Disp: 30 tablet, Rfl: 6 .  thiamine 100 MG tablet, Take 1 tablet (100 mg total) by mouth daily., Disp: 30 tablet, Rfl: 6 No Known Allergies   Social History   Socioeconomic History  . Marital status: Divorced    Spouse name: Not on file  . Number of children: Not on file  . Years of education: Not on file  . Highest education level: Not on file  Occupational History  . Not on file  Tobacco Use  . Smoking status: Never Smoker  . Smokeless tobacco: Never Used  Vaping Use  . Vaping Use: Never used  Substance and Sexual Activity  . Alcohol use: Yes    Alcohol/week: 14.0 standard drinks    Types: 14 Cans of beer per week    Comment: patient endorses at least 1-2 beers daily (04/2020)  . Drug use: No  . Sexual activity: Not Currently  Other Topics Concern  . Not on file  Social History Narrative   Lives  at home with his brother who is crippled. He takes care of his brother.    Right handed    Social Determinants of Health   Financial Resource Strain: High Risk  . Difficulty of Paying Living Expenses: Hard  Food Insecurity: No Food Insecurity  . Worried About Programme researcher, broadcasting/film/video in the Last Year: Never true  . Ran Out of Food in the Last Year: Never true  Transportation Needs: No Transportation Needs  . Lack of Transportation (Medical): No  . Lack of Transportation (Non-Medical): No  Physical Activity:   . Days of Exercise per Week:   . Minutes of Exercise per Session:   Stress:   . Feeling of Stress :   Social Connections:   .  Frequency of Communication with Friends and Family:   . Frequency of Social Gatherings with Friends and Family:   . Attends Religious Services:   . Active Member of Clubs or Organizations:   . Attends Banker Meetings:   Marland Kitchen Marital Status:   Intimate Partner Violence:   . Fear of Current or Ex-Partner:   . Emotionally Abused:   Marland Kitchen Physically Abused:   . Sexually Abused:     Physical Exam Vitals reviewed.  Constitutional:      Appearance: He is normal weight.  HENT:     Head: Normocephalic.     Nose: Nose normal.     Mouth/Throat:     Mouth: Mucous membranes are moist.  Eyes:     Pupils: Pupils are equal, round, and reactive to light.  Cardiovascular:     Rate and Rhythm: Normal rate and regular rhythm.     Pulses: Normal pulses.     Heart sounds: Normal heart sounds.  Pulmonary:     Effort: Pulmonary effort is normal.     Breath sounds: Normal breath sounds.  Abdominal:     Palpations: Abdomen is soft.  Musculoskeletal:        General: Normal range of motion.     Cervical back: Normal range of motion.     Right lower leg: No edema.     Left lower leg: No edema.  Skin:    General: Skin is warm and dry.     Capillary Refill: Capillary refill takes less than 2 seconds.  Neurological:     Mental Status: He is alert. Mental status is at baseline.  Psychiatric:        Mood and Affect: Mood normal.     Arrived for home visit for [name] who was alert and oriented ambulating outside on his porch reporting he feels fine with no complaints today. He was last seen two weeks ago. [name] completed one pill box but second pill box was still full, so essentially missed one weeks worth of medication. [name] denied chest pain, shortness of breath, dizziness, leg swelling. Lung sounds clear. No edema noted. [name] was educated on importance of taking his medications, [name] verbalized agreement. Vitals were obtained. Medications were reviewed and pill box was checked for  accuracy and filled accordingly. Home visit complete. I will see [name] in one week.   Refills:  Entresto Corlanor Carvedilol Spironolactone Zetia Atorvastatin   Weight today- 143.6lbs     Future Appointments  Date Time Provider Department Center  08/24/2020 10:40 AM [name], Bevelyn Buckles, MD [facility] None  10/05/2020  8:30 AM Claiborne Rigg, NP [facility] None  11/29/2020  7:10 AM [facility] DEVICE REMOTES [facility]     ACTION: Home visit completed  Next visit planned for one week

## 2020-07-19 MED FILL — SPIRONOLACTONE 25 MG TABS: 25 | 30 days supply | Qty: 30 | Fill #2

## 2020-07-19 MED FILL — ATORVASTATIN 80 MG TABLET: 80 | 90 days supply | Qty: 90 | Fill #1

## 2020-07-19 MED FILL — CORLANOR 5 MG TABLET: 5 | 30 days supply | Qty: 60 | Fill #1

## 2020-07-19 MED FILL — EZETIMIBE 10 MG TABS: 10 | 30 days supply | Qty: 30 | Fill #2

## 2020-07-20 ENCOUNTER — Other Ambulatory Visit (HOSPITAL_COMMUNITY): Payer: Self-pay

## 2020-07-20 NOTE — Progress Notes (Signed)
Paramedicine Encounter    Patient ID: [name], male    DOB: 03/20/1961, 59 y.o.   MRN: [id number]   Patient Care Team: Claiborne Rigg, NP as PCP - General (Nurse Practitioner) Marinus Maw, MD as PCP - Electrophysiology (Cardiology)  Patient Active Problem List   Diagnosis Date Noted  . Cardiac arrest, cause unspecified (HCC) 07/06/2020  . Left arm cellulitis 05/19/2020  . Essential hypertension 05/18/2020  . Mixed hyperlipidemia 05/18/2020  . Alcohol abuse 05/18/2020  . Cellulitis of left upper extremity 05/18/2020  . Lactic acidosis 05/18/2020  . 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 ICD (implantable cardioverter-defibrillator) in place 08/29/2019  . Chronic systolic heart failure (HCC) 08/25/2019  . 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 Coronary artery disease involving native coronary artery of native heart without angina pectoris 12/07/2017    Current Outpatient Medications:  .  aspirin EC 81 MG tablet, Take 1 tablet (81 mg total) by mouth daily. Swallow whole., Disp: 30 tablet, Rfl: 11 .  atorvastatin (LIPITOR) 80 MG tablet, Take 1 tablet (80 mg total) by mouth daily at 6 PM., Disp: 90 tablet, Rfl: 3 .  Blood Pressure Monitor DEVI, Please provide patient with insurance approved blood pressure monitor. I10.0 (Patient not taking: Reported on 07/13/2020), Disp: 1 each, Rfl: 0 .  carvedilol (COREG) 3.125 MG tablet, Take 1 tablet (3.125 mg total) by mouth 2 (two) times daily with a meal., Disp: 60 tablet, Rfl: 2 .  carvedilol (COREG) 3.125 MG tablet, Take 1 tablet (3.125 mg total) by mouth 2 (two) times daily with a meal., Disp: 60 tablet, Rfl: 2 .  ezetimibe (ZETIA) 10 MG tablet, Take 1 tablet (10 mg total) by mouth daily., Disp: 30 tablet, Rfl: 6 .  folic acid (FOLVITE) 1 MG tablet, Take 1 tablet (1 mg total) by mouth daily., Disp: 30 tablet, Rfl: 6 .  furosemide (LASIX) 40 MG tablet, Take 1  tablet (40 mg total) by mouth daily as needed for fluid., Disp: 15 tablet, Rfl: 6 .  ivabradine (CORLANOR) 5 MG TABS tablet, Take 1 tablet (5 mg total) by mouth 2 (two) times daily with a meal. (Patient taking differently: Take 2.5 mg by mouth 2 (two) times daily with a meal. ), Disp: 60 tablet, Rfl: 6 .  levETIRAcetam (KEPPRA) 500 MG tablet, Take 1 tablet (500 mg total) by mouth 2 (two) times daily., Disp: 180 tablet, Rfl: 3 .  sacubitril-valsartan (ENTRESTO) 24-26 MG, Take 1 tablet by mouth 2 (two) times daily., Disp: 60 tablet, Rfl: 6 .  spironolactone (ALDACTONE) 25 MG tablet, Take 1 tablet (25 mg total) by mouth daily., Disp: 30 tablet, Rfl: 6 .  thiamine 100 MG tablet, Take 1 tablet (100 mg total) by mouth daily., Disp: 30 tablet, Rfl: 6 No Known Allergies   Social History   Socioeconomic History  . Marital status: Divorced    Spouse name: Not on file  . Number of children: Not on file  . Years of education: Not on file  . Highest education level: Not on file  Occupational History  . Not on file  Tobacco Use  . Smoking status: Never Smoker  . Smokeless tobacco: Never Used  Vaping Use  . Vaping Use: Never used  Substance and Sexual Activity  . Alcohol use: Yes    Alcohol/week: 14.0 standard drinks    Types: 14 Cans of beer per week    Comment: patient endorses  at least 1-2 beers daily (04/2020)  . Drug use: No  . Sexual activity: Not Currently  Other Topics Concern  . Not on file  Social History Narrative   Lives at home with his brother who is crippled. He takes care of his brother.    Right handed    Social Determinants of Health   Financial Resource Strain: High Risk  . Difficulty of Paying Living Expenses: Hard  Food Insecurity: No Food Insecurity  . Worried About Programme researcher, broadcasting/film/video in the Last Year: Never true  . Ran Out of Food in the Last Year: Never true  Transportation Needs: No Transportation Needs  . Lack of Transportation (Medical): No  . Lack of  Transportation (Non-Medical): No  Physical Activity:   . Days of Exercise per Week:   . Minutes of Exercise per Session:   Stress:   . Feeling of Stress :   Social Connections:   . Frequency of Communication with Friends and Family:   . Frequency of Social Gatherings with Friends and Family:   . Attends Religious Services:   . Active Member of Clubs or Organizations:   . Attends Banker Meetings:   Marland Kitchen Marital Status:   Intimate Partner Violence:   . Fear of Current or Ex-Partner:   . Emotionally Abused:   Marland Kitchen Physically Abused:   . Sexually Abused:     Physical Exam Vitals reviewed.  Constitutional:      Appearance: He is normal weight.  HENT:     Head: Normocephalic.     Nose: Nose normal.     Mouth/Throat:     Mouth: Mucous membranes are moist.     Pharynx: Oropharynx is clear.  Eyes:     Pupils: Pupils are equal, round, and reactive to light.  Cardiovascular:     Rate and Rhythm: Normal rate and regular rhythm.     Pulses: Normal pulses.     Heart sounds: Normal heart sounds.  Pulmonary:     Effort: Pulmonary effort is normal.     Breath sounds: Normal breath sounds.  Abdominal:     General: Abdomen is flat.     Palpations: Abdomen is soft.  Musculoskeletal:        General: Normal range of motion.     Cervical back: Normal range of motion.     Right lower leg: No edema.     Left lower leg: No edema.  Skin:    General: Skin is warm and dry.     Capillary Refill: Capillary refill takes less than 2 seconds.  Neurological:     Mental Status: He is alert. Mental status is at baseline.  Psychiatric:        Mood and Affect: Mood normal.     Arrived for home visit for Derico who was alert and oriented reporting he feels good and has no complaints. Vitals obtained. No edema, JVD or lung sounds clear. Vitals obtained. Medications reviewed and confirmed. Pill box filled accordingly. Yaseen has no upcoming appointments. Home visit complete. I will see him in  one week.   Refills: Entresto     Future Appointments  Date Time Provider Department Center  08/24/2020 10:40 AM [name], Bevelyn Buckles, MD [facility] None  10/05/2020  8:30 AM Claiborne Rigg, NP [facility] None  11/29/2020  7:10 AM [facility]     ACTION: Home visit completed Next visit planned for one week

## 2020-07-21 ENCOUNTER — Telehealth (HOSPITAL_COMMUNITY): Payer: Self-pay | Admitting: Licensed Clinical Social Worker

## 2020-07-21 NOTE — Telephone Encounter (Signed)
CSW attempted to call [facility] to set up pt transportation for clinic appt next month.  Informed by [facility] worker that now all transports must be called in by pt managed Medicaid service.  CSW attempted to call [facility] Medicaid to arrange but unable to schedule more than 30 days out- will attempt again next week.  Burna Sis, LCSW Clinical Social Worker [facility] Desk#: [phone] Cell#: [phone]

## 2020-07-26 ENCOUNTER — Telehealth (HOSPITAL_COMMUNITY): Payer: Self-pay | Admitting: Licensed Clinical Social Worker

## 2020-07-26 NOTE — Telephone Encounter (Signed)
CSW called [name] Managed Medicaid line to set up transport for pt to clinic appt this month.  Able to request ride for 8/31- anticipated home pick up 9:40am and anticipated pick up from appt at 11:40am.  Confirmation number #[id number].  They were made aware pt does not have a phone and prefers using [facility] as their driver is familiar with the pt and will pick up regardless of being unable to contact by phone.  CSW will continue to follow and assist as needed  Burna Sis, LCSW Clinical Social Worker [facility] Desk#: [phone] Cell#: [phone]

## 2020-07-27 ENCOUNTER — Other Ambulatory Visit (HOSPITAL_COMMUNITY): Payer: Self-pay

## 2020-07-27 NOTE — Progress Notes (Signed)
Paramedicine Encounter    Patient ID: [name], male    DOB: Sep 16, 1961, 59 y.o.   MRN: [id number]   Patient Care Team: Claiborne Rigg, NP as PCP - General (Nurse Practitioner) Marinus Maw, MD as PCP - Electrophysiology (Cardiology)  Patient Active Problem List   Diagnosis Date Noted  . Cardiac arrest, cause unspecified (HCC) 07/06/2020  . Left arm cellulitis 05/19/2020  . Essential hypertension 05/18/2020  . Mixed hyperlipidemia 05/18/2020  . Alcohol abuse 05/18/2020  . Cellulitis of left upper extremity 05/18/2020  . Lactic acidosis 05/18/2020  . 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 ICD (implantable cardioverter-defibrillator) in place 08/29/2019  . Chronic systolic heart failure (HCC) 08/25/2019  . 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 Coronary artery disease involving native coronary artery of native heart without angina pectoris 12/07/2017    Current Outpatient Medications:  .  aspirin EC 81 MG tablet, Take 1 tablet (81 mg total) by mouth daily. Swallow whole., Disp: 30 tablet, Rfl: 11 .  atorvastatin (LIPITOR) 80 MG tablet, Take 1 tablet (80 mg total) by mouth daily at 6 PM., Disp: 90 tablet, Rfl: 3 .  Blood Pressure Monitor DEVI, Please provide patient with insurance approved blood pressure monitor. I10.0 (Patient not taking: Reported on 07/13/2020), Disp: 1 each, Rfl: 0 .  carvedilol (COREG) 3.125 MG tablet, Take 1 tablet (3.125 mg total) by mouth 2 (two) times daily with a meal., Disp: 60 tablet, Rfl: 2 .  carvedilol (COREG) 3.125 MG tablet, Take 1 tablet (3.125 mg total) by mouth 2 (two) times daily with a meal., Disp: 60 tablet, Rfl: 2 .  ezetimibe (ZETIA) 10 MG tablet, Take 1 tablet (10 mg total) by mouth daily., Disp: 30 tablet, Rfl: 6 .  folic acid (FOLVITE) 1 MG tablet, Take 1 tablet (1 mg total) by mouth daily., Disp: 30 tablet, Rfl: 6 .  furosemide (LASIX) 40 MG tablet, Take 1  tablet (40 mg total) by mouth daily as needed for fluid. (Patient not taking: Reported on 07/20/2020), Disp: 15 tablet, Rfl: 6 .  ivabradine (CORLANOR) 5 MG TABS tablet, Take 1 tablet (5 mg total) by mouth 2 (two) times daily with a meal. (Patient taking differently: Take 2.5 mg by mouth 2 (two) times daily with a meal. ), Disp: 60 tablet, Rfl: 6 .  levETIRAcetam (KEPPRA) 500 MG tablet, Take 1 tablet (500 mg total) by mouth 2 (two) times daily., Disp: 180 tablet, Rfl: 3 .  sacubitril-valsartan (ENTRESTO) 24-26 MG, Take 1 tablet by mouth 2 (two) times daily., Disp: 60 tablet, Rfl: 6 .  spironolactone (ALDACTONE) 25 MG tablet, Take 1 tablet (25 mg total) by mouth daily., Disp: 30 tablet, Rfl: 6 .  thiamine 100 MG tablet, Take 1 tablet (100 mg total) by mouth daily., Disp: 30 tablet, Rfl: 6 No Known Allergies   Social History   Socioeconomic History  . Marital status: Divorced    Spouse name: Not on file  . Number of children: Not on file  . Years of education: Not on file  . Highest education level: Not on file  Occupational History  . Not on file  Tobacco Use  . Smoking status: Never Smoker  . Smokeless tobacco: Never Used  Vaping Use  . Vaping Use: Never used  Substance and Sexual Activity  . Alcohol use: Yes    Alcohol/week: 14.0 standard drinks    Types: 14 Cans of beer per week  Comment: patient endorses at least 1-2 beers daily (04/2020)  . Drug use: No  . Sexual activity: Not Currently  Other Topics Concern  . Not on file  Social History Narrative   Lives at home with his brother who is crippled. He takes care of his brother.    Right handed    Social Determinants of Health   Financial Resource Strain: High Risk  . Difficulty of Paying Living Expenses: Hard  Food Insecurity: No Food Insecurity  . Worried About Programme researcher, broadcasting/film/video in the Last Year: Never true  . Ran Out of Food in the Last Year: Never true  Transportation Needs: No Transportation Needs  . Lack of  Transportation (Medical): No  . Lack of Transportation (Non-Medical): No  Physical Activity:   . Days of Exercise per Week:   . Minutes of Exercise per Session:   Stress:   . Feeling of Stress :   Social Connections:   . Frequency of Communication with Friends and Family:   . Frequency of Social Gatherings with Friends and Family:   . Attends Religious Services:   . Active Member of Clubs or Organizations:   . Attends Banker Meetings:   Marland Kitchen Marital Status:   Intimate Partner Violence:   . Fear of Current or Ex-Partner:   . Emotionally Abused:   Marland Kitchen Physically Abused:   . Sexually Abused:     Physical Exam Vitals reviewed.  Constitutional:      Appearance: He is normal weight.  HENT:     Head: Normocephalic.     Nose: Nose normal.     Mouth/Throat:     Mouth: Mucous membranes are moist.     Pharynx: Oropharynx is clear.  Eyes:     Pupils: Pupils are equal, round, and reactive to light.  Cardiovascular:     Rate and Rhythm: Normal rate and regular rhythm.     Pulses: Normal pulses.     Heart sounds: Normal heart sounds.  Pulmonary:     Effort: Pulmonary effort is normal.     Breath sounds: Normal breath sounds.  Abdominal:     General: Abdomen is flat.     Palpations: Abdomen is soft.  Musculoskeletal:        General: Swelling present. Normal range of motion.     Cervical back: Normal range of motion.     Right lower leg: No edema.     Left lower leg: No edema.     Comments: Right hand swelling from bee sting.  Skin:    General: Skin is warm and dry.  Neurological:     General: No focal deficit present.     Mental Status: He is alert. Mental status is at baseline.  Psychiatric:        Mood and Affect: Mood normal.      Arrived for home visit for [name] who was alert and oriented reporting he was stung by a bee yesterday after stepping into a bee's nest. Right hand swelling with some redness, hives noted to lower abdomen as well. [name] complained of  itching to right hand, right arm and lower abdomen. Benadryl was given and patient was told to take this every 12 hours to assist with hives and itching. [name] denied shortness of breath or throat swelling. Airway was clear upon assessment. Vitals as noted in report. Jahmeek was made aware to contact EMS if symptoms from sting worsened. He agreed. Medications were verified and confirmed. Pill box filled  accordingly. Erique was compliant with medications this last week. No edema, no JVD, abdomen soft non tender. Home visit complete. I will see patient in one week.   Refills: NONE     Future Appointments  Date Time Provider Department Center  08/24/2020 10:40 AM [name], Bevelyn Buckles, MD [facility] None  10/05/2020  8:30 AM Claiborne Rigg, NP [facility] None  11/29/2020  7:10 AM [facility]     ACTION: Home visit completed Next visit planned for one week

## 2020-08-02 ENCOUNTER — Telehealth (HOSPITAL_COMMUNITY): Payer: Self-pay | Admitting: Licensed Clinical Social Worker

## 2020-08-02 NOTE — Telephone Encounter (Signed)
CSW received call from pt landlord informing that pt is behind $120 from July rent and owes full $600 for August.  She is wanting to help him apply to [facility] program but he doesn't have a phone or email to put on application- asked to use CSW number and email- CSW provided permission to do so.  Will continue to follow and assist as needed  Burna Sis, LCSW Clinical Social Worker [facility] Desk#: [phone] Cell#: [phone]

## 2020-08-03 ENCOUNTER — Other Ambulatory Visit (HOSPITAL_COMMUNITY): Payer: Self-pay

## 2020-08-03 NOTE — Progress Notes (Signed)
Paramedicine Encounter    Patient ID: [name], male    DOB: 10-09-1961, 59 y.o.   MRN: [id number]   Patient Care Team: Claiborne Rigg, NP as PCP - General (Nurse Practitioner) Marinus Maw, MD as PCP - Electrophysiology (Cardiology)  Patient Active Problem List   Diagnosis Date Noted  . Cardiac arrest, cause unspecified (HCC) 07/06/2020  . Left arm cellulitis 05/19/2020  . Essential hypertension 05/18/2020  . Mixed hyperlipidemia 05/18/2020  . Alcohol abuse 05/18/2020  . Cellulitis of left upper extremity 05/18/2020  . Lactic acidosis 05/18/2020  . 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 ICD (implantable cardioverter-defibrillator) in place 08/29/2019  . Chronic systolic heart failure (HCC) 08/25/2019  . 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 Coronary artery disease involving native coronary artery of native heart without angina pectoris 12/07/2017    Current Outpatient Medications:  .  aspirin EC 81 MG tablet, Take 1 tablet (81 mg total) by mouth daily. Swallow whole. (Patient not taking: Reported on 07/27/2020), Disp: 30 tablet, Rfl: 11 .  atorvastatin (LIPITOR) 80 MG tablet, Take 1 tablet (80 mg total) by mouth daily at 6 PM., Disp: 90 tablet, Rfl: 3 .  Blood Pressure Monitor DEVI, Please provide patient with insurance approved blood pressure monitor. I10.0 (Patient not taking: Reported on 07/13/2020), Disp: 1 each, Rfl: 0 .  carvedilol (COREG) 3.125 MG tablet, Take 1 tablet (3.125 mg total) by mouth 2 (two) times daily with a meal., Disp: 60 tablet, Rfl: 2 .  carvedilol (COREG) 3.125 MG tablet, Take 1 tablet (3.125 mg total) by mouth 2 (two) times daily with a meal., Disp: 60 tablet, Rfl: 2 .  ezetimibe (ZETIA) 10 MG tablet, Take 1 tablet (10 mg total) by mouth daily., Disp: 30 tablet, Rfl: 6 .  folic acid (FOLVITE) 1 MG tablet, Take 1 tablet (1 mg total) by mouth daily., Disp: 30 tablet, Rfl: 6 .   furosemide (LASIX) 40 MG tablet, Take 1 tablet (40 mg total) by mouth daily as needed for fluid. (Patient not taking: Reported on 07/20/2020), Disp: 15 tablet, Rfl: 6 .  ivabradine (CORLANOR) 5 MG TABS tablet, Take 1 tablet (5 mg total) by mouth 2 (two) times daily with a meal. (Patient taking differently: Take 2.5 mg by mouth 2 (two) times daily with a meal. ), Disp: 60 tablet, Rfl: 6 .  levETIRAcetam (KEPPRA) 500 MG tablet, Take 1 tablet (500 mg total) by mouth 2 (two) times daily., Disp: 180 tablet, Rfl: 3 .  sacubitril-valsartan (ENTRESTO) 24-26 MG, Take 1 tablet by mouth 2 (two) times daily., Disp: 60 tablet, Rfl: 6 .  spironolactone (ALDACTONE) 25 MG tablet, Take 1 tablet (25 mg total) by mouth daily., Disp: 30 tablet, Rfl: 6 .  thiamine 100 MG tablet, Take 1 tablet (100 mg total) by mouth daily., Disp: 30 tablet, Rfl: 6 No Known Allergies   Social History   Socioeconomic History  . Marital status: Divorced    Spouse name: Not on file  . Number of children: Not on file  . Years of education: Not on file  . Highest education level: Not on file  Occupational History  . Not on file  Tobacco Use  . Smoking status: Never Smoker  . Smokeless tobacco: Never Used  Vaping Use  . Vaping Use: Never used  Substance and Sexual Activity  . Alcohol use: Yes    Alcohol/week: 14.0 standard drinks    Types:  14 Cans of beer per week    Comment: patient endorses at least 1-2 beers daily (04/2020)  . Drug use: No  . Sexual activity: Not Currently  Other Topics Concern  . Not on file  Social History Narrative   Lives at home with his brother who is crippled. He takes care of his brother.    Right handed    Social Determinants of Health   Financial Resource Strain: High Risk  . Difficulty of Paying Living Expenses: Hard  Food Insecurity: No Food Insecurity  . Worried About Programme researcher, broadcasting/film/video in the Last Year: Never true  . Ran Out of Food in the Last Year: Never true  Transportation Needs:  No Transportation Needs  . Lack of Transportation (Medical): No  . Lack of Transportation (Non-Medical): No  Physical Activity:   . Days of Exercise per Week:   . Minutes of Exercise per Session:   Stress:   . Feeling of Stress :   Social Connections:   . Frequency of Communication with Friends and Family:   . Frequency of Social Gatherings with Friends and Family:   . Attends Religious Services:   . Active Member of Clubs or Organizations:   . Attends Banker Meetings:   Marland Kitchen Marital Status:   Intimate Partner Violence:   . Fear of Current or Ex-Partner:   . Emotionally Abused:   Marland Kitchen Physically Abused:   . Sexually Abused:     Physical Exam Vitals reviewed.  Constitutional:      Appearance: He is normal weight.  HENT:     Head: Normocephalic.     Nose: Nose normal.     Mouth/Throat:     Mouth: Mucous membranes are moist.     Pharynx: Oropharynx is clear.  Eyes:     Pupils: Pupils are equal, round, and reactive to light.  Cardiovascular:     Rate and Rhythm: Normal rate and regular rhythm.     Pulses: Normal pulses.     Heart sounds: Normal heart sounds.  Pulmonary:     Effort: Pulmonary effort is normal.     Breath sounds: Normal breath sounds.  Abdominal:     General: Abdomen is flat.     Palpations: Abdomen is soft.  Musculoskeletal:        General: Normal range of motion.     Cervical back: Normal range of motion.     Right lower leg: No edema.     Left lower leg: No edema.  Skin:    General: Skin is warm and dry.     Capillary Refill: Capillary refill takes less than 2 seconds.  Neurological:     General: No focal deficit present.     Mental Status: He is alert. Mental status is at baseline.  Psychiatric:        Mood and Affect: Mood normal.     Arrived for home visit for [facility] who was alert and oriented. [name] reports he has had no issues with shortness of breath, dizziness or trouble eating. [name] did complain of some breast tenderness a  long with breast swelling. I advised this could be related to the Spironolactone. I will consult with PA in clinic and advise to patient next week. He agreed. Vitals obtained within normal range. Medications reviewed and confirmed. Pill box filled accordingly. Lannie agreed to visit in one week.   Social: [place] is behind in rent, CSW and his landlord are discussing options for same.  Refills: NONE      Future Appointments  Date Time Provider Department Center  08/24/2020 10:40 AM [name], Bevelyn Buckles, MD [facility] None  10/05/2020  8:30 AM Claiborne Rigg, NP [facility] None  11/29/2020  7:10 AM [facility]     ACTION: Home visit completed Next visit planned for one week

## 2020-08-10 ENCOUNTER — Other Ambulatory Visit (HOSPITAL_COMMUNITY): Payer: Self-pay

## 2020-08-10 NOTE — Progress Notes (Signed)
Paramedicine Encounter    Patient ID: [name], male    DOB: [birth year], 59 y.o.   MRN: [id number]   Patient Care Team: Claiborne Rigg, NP as PCP - General (Nurse Practitioner) Marinus Maw, MD as PCP - Electrophysiology (Cardiology)  Patient Active Problem List   Diagnosis Date Noted  . Cardiac arrest, cause unspecified (HCC) 07/06/2020  . Left arm cellulitis 05/19/2020  . Essential hypertension 05/18/2020  . Mixed hyperlipidemia 05/18/2020  . Alcohol abuse 05/18/2020  . Cellulitis of left upper extremity 05/18/2020  . Lactic acidosis 05/18/2020  . 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 ICD (implantable cardioverter-defibrillator) in place 08/29/2019  . Chronic systolic heart failure (HCC) 08/25/2019  . 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 Coronary artery disease involving native coronary artery of native heart without angina pectoris 12/07/2017    Current Outpatient Medications:  .  aspirin EC 81 MG tablet, Take 1 tablet (81 mg total) by mouth daily. Swallow whole. (Patient not taking: Reported on 07/27/2020), Disp: 30 tablet, Rfl: 11 .  atorvastatin (LIPITOR) 80 MG tablet, Take 1 tablet (80 mg total) by mouth daily at 6 PM., Disp: 90 tablet, Rfl: 3 .  Blood Pressure Monitor DEVI, Please provide patient with insurance approved blood pressure monitor. I10.0 (Patient not taking: Reported on 07/13/2020), Disp: 1 each, Rfl: 0 .  carvedilol (COREG) 3.125 MG tablet, Take 1 tablet (3.125 mg total) by mouth 2 (two) times daily with a meal., Disp: 60 tablet, Rfl: 2 .  carvedilol (COREG) 3.125 MG tablet, Take 1 tablet (3.125 mg total) by mouth 2 (two) times daily with a meal., Disp: 60 tablet, Rfl: 2 .  ezetimibe (ZETIA) 10 MG tablet, Take 1 tablet (10 mg total) by mouth daily., Disp: 30 tablet, Rfl: 6 .  folic acid (FOLVITE) 1 MG tablet, Take 1 tablet (1 mg total) by mouth daily., Disp: 30 tablet, Rfl: 6 .   furosemide (LASIX) 40 MG tablet, Take 1 tablet (40 mg total) by mouth daily as needed for fluid. (Patient not taking: Reported on 07/20/2020), Disp: 15 tablet, Rfl: 6 .  ivabradine (CORLANOR) 5 MG TABS tablet, Take 1 tablet (5 mg total) by mouth 2 (two) times daily with a meal. (Patient taking differently: Take 2.5 mg by mouth 2 (two) times daily with a meal. ), Disp: 60 tablet, Rfl: 6 .  levETIRAcetam (KEPPRA) 500 MG tablet, Take 1 tablet (500 mg total) by mouth 2 (two) times daily., Disp: 180 tablet, Rfl: 3 .  sacubitril-valsartan (ENTRESTO) 24-26 MG, Take 1 tablet by mouth 2 (two) times daily., Disp: 60 tablet, Rfl: 6 .  spironolactone (ALDACTONE) 25 MG tablet, Take 1 tablet (25 mg total) by mouth daily., Disp: 30 tablet, Rfl: 6 .  thiamine 100 MG tablet, Take 1 tablet (100 mg total) by mouth daily., Disp: 30 tablet, Rfl: 6 No Known Allergies   Social History   Socioeconomic History  . Marital status: Divorced    Spouse name: Not on file  . Number of children: Not on file  . Years of education: Not on file  . Highest education level: Not on file  Occupational History  . Not on file  Tobacco Use  . Smoking status: Never Smoker  . Smokeless tobacco: Never Used  Vaping Use  . Vaping Use: Never used  Substance and Sexual Activity  . Alcohol use: Yes    Alcohol/week: 14.0 standard drinks    Types:  14 Cans of beer per week    Comment: patient endorses at least 1-2 beers daily (04/2020)  . Drug use: No  . Sexual activity: Not Currently  Other Topics Concern  . Not on file  Social History Narrative   Lives at home with his brother who is crippled. He takes care of his brother.    Right handed    Social Determinants of Health   Financial Resource Strain: High Risk  . Difficulty of Paying Living Expenses: Hard  Food Insecurity: No Food Insecurity  . Worried About Programme researcher, broadcasting/film/video in the Last Year: Never true  . Ran Out of Food in the Last Year: Never true  Transportation Needs:  No Transportation Needs  . Lack of Transportation (Medical): No  . Lack of Transportation (Non-Medical): No  Physical Activity:   . Days of Exercise per Week:   . Minutes of Exercise per Session:   Stress:   . Feeling of Stress :   Social Connections:   . Frequency of Communication with Friends and Family:   . Frequency of Social Gatherings with Friends and Family:   . Attends Religious Services:   . Active Member of Clubs or Organizations:   . Attends Banker Meetings:   Marland Kitchen Marital Status:   Intimate Partner Violence:   . Fear of Current or Ex-Partner:   . Emotionally Abused:   Marland Kitchen Physically Abused:   . Sexually Abused:     Physical Exam Vitals reviewed.  Constitutional:      Appearance: He is normal weight.  HENT:     Head: Normocephalic.     Nose: Nose normal.     Mouth/Throat:     Mouth: Mucous membranes are moist.     Pharynx: Oropharynx is clear.  Eyes:     Pupils: Pupils are equal, round, and reactive to light.  Cardiovascular:     Rate and Rhythm: Normal rate and regular rhythm.     Pulses: Normal pulses.     Heart sounds: Normal heart sounds.  Pulmonary:     Effort: Pulmonary effort is normal.     Breath sounds: Normal breath sounds.  Abdominal:     General: Abdomen is flat.     Palpations: Abdomen is soft.  Musculoskeletal:        General: Normal range of motion.     Cervical back: Normal range of motion.     Right lower leg: No edema.     Left lower leg: No edema.  Skin:    General: Skin is warm and dry.     Capillary Refill: Capillary refill takes less than 2 seconds.  Neurological:     Mental Status: He is alert. Mental status is at baseline.  Psychiatric:        Mood and Affect: Mood normal.      Arrived for home visit for [facility] who was short of breath upon walking with fatigue. Viral reported he has been "sick" for a few days now with a cough, congestion, shortness of breath, body aches, fatigue, diarrhea and chills. [name]  stated he has had some people who stayed with him in his house who had COVID but did not wear masks. COVID suspected with [name] currently however [name] refused to go to hospital for further evaluation and testing. [name] has no transportation for testing and is currently not vaccinated. [name] reported he has not had an appetite and has not eaten in the last few days. [name] was encouraged  to eat and hydrate appropriately, he verbalized understanding. [name]'s roommate [name] was present and I informed her of his symptoms and if he worsened to call EMS, she agreed and stated she thought he needed to be evaluated and I agreed and [name] continued to refuse transport. Vitals were obtained and are as noted. [name] has neglected his medications for the last week. I reviewed medication and checked pill box. [name] was once again encouraged of the importance of seeking medical attention for his symptoms and he continued to refuse transport. I obtained [name]'s number and will be reaching out to check on patient over the next few days. I will see patient in one week.   Refills: NONE      Future Appointments  Date Time Provider Department Center  08/24/2020 10:40 AM [name], Bevelyn Buckles, MD [facility] None  10/05/2020  8:30 AM Claiborne Rigg, NP [facility] None  11/29/2020  7:10 AM [facility] DEVICE REMOTES [facility]     ACTION: Home visit completed Next visit planned for one week

## 2020-08-13 ENCOUNTER — Emergency Department (HOSPITAL_COMMUNITY): Payer: Medicaid Other

## 2020-08-13 ENCOUNTER — Encounter (HOSPITAL_COMMUNITY): Payer: Self-pay | Admitting: Pediatrics

## 2020-08-13 ENCOUNTER — Inpatient Hospital Stay (HOSPITAL_COMMUNITY)
Admission: EM | Admit: 2020-08-13 | Discharge: 2020-08-18 | DRG: 177 | Disposition: A | Discharge status: Home / Self Care | Payer: Medicaid Other | Attending: Internal Medicine | Admitting: Internal Medicine

## 2020-08-13 ENCOUNTER — Other Ambulatory Visit: Payer: Self-pay

## 2020-08-13 DIAGNOSIS — G40409 Other generalized epilepsy and epileptic syndromes, not intractable, without status epilepticus: Secondary | ICD-10-CM | POA: Diagnosis present

## 2020-08-13 DIAGNOSIS — E861 Hypovolemia: Secondary | ICD-10-CM | POA: Diagnosis present

## 2020-08-13 DIAGNOSIS — Z8249 Family history of ischemic heart disease and other diseases of the circulatory system: Secondary | ICD-10-CM

## 2020-08-13 DIAGNOSIS — I2510 Atherosclerotic heart disease of native coronary artery without angina pectoris: Secondary | ICD-10-CM | POA: Diagnosis present

## 2020-08-13 DIAGNOSIS — R9431 Abnormal electrocardiogram [ECG] [EKG]: Secondary | ICD-10-CM | POA: Diagnosis present

## 2020-08-13 DIAGNOSIS — Z7982 Long term (current) use of aspirin: Secondary | ICD-10-CM

## 2020-08-13 DIAGNOSIS — I10 Essential (primary) hypertension: Secondary | ICD-10-CM | POA: Diagnosis not present

## 2020-08-13 DIAGNOSIS — J1282 Pneumonia due to coronavirus disease 2019: Secondary | ICD-10-CM | POA: Diagnosis not present

## 2020-08-13 DIAGNOSIS — I255 Ischemic cardiomyopathy: Secondary | ICD-10-CM | POA: Diagnosis present

## 2020-08-13 DIAGNOSIS — Z951 Presence of aortocoronary bypass graft: Secondary | ICD-10-CM

## 2020-08-13 DIAGNOSIS — G40209 Localization-related (focal) (partial) symptomatic epilepsy and epileptic syndromes with complex partial seizures, not intractable, without status epilepticus: Secondary | ICD-10-CM | POA: Diagnosis present

## 2020-08-13 DIAGNOSIS — E871 Hypo-osmolality and hyponatremia: Secondary | ICD-10-CM | POA: Diagnosis present

## 2020-08-13 DIAGNOSIS — K219 Gastro-esophageal reflux disease without esophagitis: Secondary | ICD-10-CM | POA: Diagnosis present

## 2020-08-13 DIAGNOSIS — I110 Hypertensive heart disease with heart failure: Secondary | ICD-10-CM | POA: Diagnosis present

## 2020-08-13 DIAGNOSIS — J9601 Acute respiratory failure with hypoxia: Secondary | ICD-10-CM | POA: Diagnosis present

## 2020-08-13 DIAGNOSIS — I5042 Chronic combined systolic (congestive) and diastolic (congestive) heart failure: Secondary | ICD-10-CM | POA: Diagnosis present

## 2020-08-13 DIAGNOSIS — U071 COVID-19: Principal | ICD-10-CM | POA: Diagnosis present

## 2020-08-13 DIAGNOSIS — J90 Pleural effusion, not elsewhere classified: Secondary | ICD-10-CM | POA: Diagnosis not present

## 2020-08-13 DIAGNOSIS — Z95810 Presence of automatic (implantable) cardiac defibrillator: Secondary | ICD-10-CM | POA: Diagnosis present

## 2020-08-13 DIAGNOSIS — E782 Mixed hyperlipidemia: Secondary | ICD-10-CM | POA: Diagnosis present

## 2020-08-13 DIAGNOSIS — R636 Underweight: Secondary | ICD-10-CM | POA: Diagnosis present

## 2020-08-13 DIAGNOSIS — F1010 Alcohol abuse, uncomplicated: Secondary | ICD-10-CM | POA: Diagnosis present

## 2020-08-13 DIAGNOSIS — T888XXA Other specified complications of surgical and medical care, not elsewhere classified, initial encounter: Secondary | ICD-10-CM

## 2020-08-13 DIAGNOSIS — R7401 Elevation of levels of liver transaminase levels: Secondary | ICD-10-CM | POA: Diagnosis present

## 2020-08-13 DIAGNOSIS — Z8674 Personal history of sudden cardiac arrest: Secondary | ICD-10-CM

## 2020-08-13 DIAGNOSIS — J9811 Atelectasis: Secondary | ICD-10-CM | POA: Diagnosis not present

## 2020-08-13 DIAGNOSIS — Z681 Body mass index (BMI) 19 or less, adult: Secondary | ICD-10-CM

## 2020-08-13 DIAGNOSIS — Z79899 Other long term (current) drug therapy: Secondary | ICD-10-CM

## 2020-08-13 DIAGNOSIS — J189 Pneumonia, unspecified organism: Secondary | ICD-10-CM | POA: Diagnosis not present

## 2020-08-13 LAB — CBC WITH DIFFERENTIAL/PLATELET
Abs Immature Granulocytes: 0.07 10*3/uL (ref 0.00–0.07)
Basophils Absolute: 0 10*3/uL (ref 0.0–0.1)
Basophils Relative: 1 %
Eosinophils Absolute: 0 10*3/uL (ref 0.0–0.5)
Eosinophils Relative: 0 %
HCT: 43.8 % (ref 39.0–52.0)
Hemoglobin: 14.5 g/dL (ref 13.0–17.0)
Immature Granulocytes: 1 %
Lymphocytes Relative: 10 %
Lymphs Abs: 0.7 10*3/uL (ref 0.7–4.0)
MCH: 32.2 pg (ref 26.0–34.0)
MCHC: 33.1 g/dL (ref 30.0–36.0)
MCV: 97.3 fL (ref 80.0–100.0)
Monocytes Absolute: 1.1 10*3/uL — ABNORMAL HIGH (ref 0.1–1.0)
Monocytes Relative: 16 %
Neutro Abs: 4.8 10*3/uL (ref 1.7–7.7)
Neutrophils Relative %: 72 %
Platelets: 186 10*3/uL (ref 150–400)
RBC: 4.5 MIL/uL (ref 4.22–5.81)
RDW: 12 % (ref 11.5–15.5)
WBC: 6.6 10*3/uL (ref 4.0–10.5)
nRBC: 0 % (ref 0.0–0.2)

## 2020-08-13 LAB — COMPREHENSIVE METABOLIC PANEL
ALT: 39 U/L (ref 0–44)
AST: 71 U/L — ABNORMAL HIGH (ref 15–41)
Albumin: 3 g/dL — ABNORMAL LOW (ref 3.5–5.0)
Alkaline Phosphatase: 52 U/L (ref 38–126)
Anion gap: 12 (ref 5–15)
BUN: 13 mg/dL (ref 6–20)
CO2: 25 mmol/L (ref 22–32)
Calcium: 8.9 mg/dL (ref 8.9–10.3)
Chloride: 93 mmol/L — ABNORMAL LOW (ref 98–111)
Creatinine, Ser: 1 mg/dL (ref 0.61–1.24)
GFR calc Af Amer: >60 mL/min (ref >60)
GFR calc non Af Amer: >60 mL/min (ref >60)
Glucose, Bld: 115 mg/dL — ABNORMAL HIGH (ref 70–99)
Potassium: 4.5 mmol/L (ref 3.5–5.1)
Sodium: 130 mmol/L — ABNORMAL LOW (ref 135–145)
Total Bilirubin: 0.6 mg/dL (ref 0.3–1.2)
Total Protein: 7.2 g/dL (ref 6.5–8.1)

## 2020-08-13 NOTE — ED Triage Notes (Signed)
Reported tested + for Covid 08/07/2020; c/o worsening cough and shortness of breath

## 2020-08-14 ENCOUNTER — Emergency Department (HOSPITAL_COMMUNITY): Payer: Medicaid Other

## 2020-08-14 DIAGNOSIS — Z951 Presence of aortocoronary bypass graft: Secondary | ICD-10-CM | POA: Diagnosis not present

## 2020-08-14 DIAGNOSIS — U071 COVID-19: Secondary | ICD-10-CM | POA: Diagnosis not present

## 2020-08-14 DIAGNOSIS — E782 Mixed hyperlipidemia: Secondary | ICD-10-CM | POA: Diagnosis present

## 2020-08-14 DIAGNOSIS — G40409 Other generalized epilepsy and epileptic syndromes, not intractable, without status epilepticus: Secondary | ICD-10-CM

## 2020-08-14 DIAGNOSIS — J189 Pneumonia, unspecified organism: Secondary | ICD-10-CM | POA: Diagnosis not present

## 2020-08-14 DIAGNOSIS — K219 Gastro-esophageal reflux disease without esophagitis: Secondary | ICD-10-CM | POA: Diagnosis present

## 2020-08-14 DIAGNOSIS — Z7982 Long term (current) use of aspirin: Secondary | ICD-10-CM | POA: Diagnosis not present

## 2020-08-14 DIAGNOSIS — Z8674 Personal history of sudden cardiac arrest: Secondary | ICD-10-CM | POA: Diagnosis not present

## 2020-08-14 DIAGNOSIS — T888XXA Other specified complications of surgical and medical care, not elsewhere classified, initial encounter: Secondary | ICD-10-CM | POA: Diagnosis present

## 2020-08-14 DIAGNOSIS — I2510 Atherosclerotic heart disease of native coronary artery without angina pectoris: Secondary | ICD-10-CM | POA: Diagnosis present

## 2020-08-14 DIAGNOSIS — J1282 Pneumonia due to coronavirus disease 2019: Secondary | ICD-10-CM

## 2020-08-14 DIAGNOSIS — E861 Hypovolemia: Secondary | ICD-10-CM | POA: Diagnosis present

## 2020-08-14 DIAGNOSIS — Z681 Body mass index (BMI) 19 or less, adult: Secondary | ICD-10-CM | POA: Diagnosis not present

## 2020-08-14 DIAGNOSIS — Z8249 Family history of ischemic heart disease and other diseases of the circulatory system: Secondary | ICD-10-CM | POA: Diagnosis not present

## 2020-08-14 DIAGNOSIS — T888XXS Other specified complications of surgical and medical care, not elsewhere classified, sequela: Secondary | ICD-10-CM | POA: Diagnosis not present

## 2020-08-14 DIAGNOSIS — J9811 Atelectasis: Secondary | ICD-10-CM | POA: Diagnosis not present

## 2020-08-14 DIAGNOSIS — R9431 Abnormal electrocardiogram [ECG] [EKG]: Secondary | ICD-10-CM

## 2020-08-14 DIAGNOSIS — E871 Hypo-osmolality and hyponatremia: Secondary | ICD-10-CM

## 2020-08-14 DIAGNOSIS — T888XXD Other specified complications of surgical and medical care, not elsewhere classified, subsequent encounter: Secondary | ICD-10-CM | POA: Diagnosis not present

## 2020-08-14 DIAGNOSIS — F1010 Alcohol abuse, uncomplicated: Secondary | ICD-10-CM | POA: Diagnosis present

## 2020-08-14 DIAGNOSIS — R7401 Elevation of levels of liver transaminase levels: Secondary | ICD-10-CM

## 2020-08-14 DIAGNOSIS — J90 Pleural effusion, not elsewhere classified: Secondary | ICD-10-CM | POA: Diagnosis not present

## 2020-08-14 DIAGNOSIS — R0602 Shortness of breath: Secondary | ICD-10-CM | POA: Diagnosis not present

## 2020-08-14 DIAGNOSIS — I110 Hypertensive heart disease with heart failure: Secondary | ICD-10-CM | POA: Diagnosis present

## 2020-08-14 DIAGNOSIS — Z95810 Presence of automatic (implantable) cardiac defibrillator: Secondary | ICD-10-CM | POA: Diagnosis not present

## 2020-08-14 DIAGNOSIS — J9601 Acute respiratory failure with hypoxia: Secondary | ICD-10-CM | POA: Diagnosis present

## 2020-08-14 DIAGNOSIS — Z950 Presence of cardiac pacemaker: Secondary | ICD-10-CM | POA: Diagnosis not present

## 2020-08-14 DIAGNOSIS — G40209 Localization-related (focal) (partial) symptomatic epilepsy and epileptic syndromes with complex partial seizures, not intractable, without status epilepticus: Secondary | ICD-10-CM | POA: Diagnosis present

## 2020-08-14 DIAGNOSIS — I5042 Chronic combined systolic (congestive) and diastolic (congestive) heart failure: Secondary | ICD-10-CM | POA: Diagnosis present

## 2020-08-14 DIAGNOSIS — I255 Ischemic cardiomyopathy: Secondary | ICD-10-CM | POA: Diagnosis present

## 2020-08-14 DIAGNOSIS — Z79899 Other long term (current) drug therapy: Secondary | ICD-10-CM | POA: Diagnosis not present

## 2020-08-14 DIAGNOSIS — R636 Underweight: Secondary | ICD-10-CM | POA: Diagnosis present

## 2020-08-14 HISTORY — DX: Elevation of levels of liver transaminase levels: R74.01

## 2020-08-14 HISTORY — DX: Hypo-osmolality and hyponatremia: E87.1

## 2020-08-14 HISTORY — DX: Abnormal electrocardiogram (ECG) (EKG): R94.31

## 2020-08-14 HISTORY — DX: Pneumonia due to coronavirus disease 2019: J12.82

## 2020-08-14 LAB — COMPREHENSIVE METABOLIC PANEL
ALT: 32 U/L (ref 0–44)
AST: 47 U/L — ABNORMAL HIGH (ref 15–41)
Albumin: 2.5 g/dL — ABNORMAL LOW (ref 3.5–5.0)
Alkaline Phosphatase: 39 U/L (ref 38–126)
Anion gap: 10 (ref 5–15)
BUN: 11 mg/dL (ref 6–20)
CO2: 24 mmol/L (ref 22–32)
Calcium: 8.2 mg/dL — ABNORMAL LOW (ref 8.9–10.3)
Chloride: 96 mmol/L — ABNORMAL LOW (ref 98–111)
Creatinine, Ser: 0.84 mg/dL (ref 0.61–1.24)
GFR calc Af Amer: >60 mL/min (ref >60)
GFR calc non Af Amer: >60 mL/min (ref >60)
Glucose, Bld: 154 mg/dL — ABNORMAL HIGH (ref 70–99)
Potassium: 4.1 mmol/L (ref 3.5–5.1)
Sodium: 130 mmol/L — ABNORMAL LOW (ref 135–145)
Total Bilirubin: 0.6 mg/dL (ref 0.3–1.2)
Total Protein: 6.2 g/dL — ABNORMAL LOW (ref 6.5–8.1)

## 2020-08-14 LAB — FIBRINOGEN: Fibrinogen: 769 mg/dL — ABNORMAL HIGH (ref 210–475)

## 2020-08-14 LAB — LACTIC ACID, PLASMA
Lactic Acid, Venous: 1.9 mmol/L (ref 0.5–1.9)
Lactic Acid, Venous: 1.1 mmol/L (ref 0.5–1.9)

## 2020-08-14 LAB — SARS CORONAVIRUS 2 BY RT PCR (HOSPITAL ORDER, PERFORMED IN ~~LOC~~ HOSPITAL LAB): SARS Coronavirus 2: POSITIVE — AB

## 2020-08-14 LAB — PROCALCITONIN: Procalcitonin: 1.31 ng/mL

## 2020-08-14 LAB — ABO/RH: ABO/RH(D): A POS

## 2020-08-14 LAB — D-DIMER, QUANTITATIVE: D-Dimer, Quant: 2.12 ug/mL-FEU — ABNORMAL HIGH (ref 0.00–0.50)

## 2020-08-14 LAB — TRIGLYCERIDES: Triglycerides: 120 mg/dL (ref <150)

## 2020-08-14 LAB — C-REACTIVE PROTEIN: CRP: 13 mg/dL — ABNORMAL HIGH (ref <1.0)

## 2020-08-14 LAB — BRAIN NATRIURETIC PEPTIDE: B Natriuretic Peptide: 202.3 pg/mL — ABNORMAL HIGH (ref 0.0–100.0)

## 2020-08-14 LAB — LACTATE DEHYDROGENASE: LDH: 261 U/L — ABNORMAL HIGH (ref 98–192)

## 2020-08-14 LAB — MAGNESIUM: Magnesium: 1.7 mg/dL (ref 1.7–2.4)

## 2020-08-14 LAB — FERRITIN: Ferritin: 644 ng/mL — ABNORMAL HIGH (ref 24–336)

## 2020-08-14 MED ORDER — ENSURE ENLIVE PO LIQD
237.0000 mL | Freq: Two times a day (BID) | ORAL | Status: DC
  Administered 2020-08-15 – 2020-08-18 (×6): 237 mL via ORAL
  Filled 2020-08-14 (×2): qty 237

## 2020-08-14 MED ORDER — SODIUM CHLORIDE 0.9 % IV SOLN
200.0000 mg | Freq: Once | INTRAVENOUS | Status: AC
  Administered 2020-08-14: 200 mg via INTRAVENOUS
  Filled 2020-08-14: qty 40

## 2020-08-14 MED ORDER — LORAZEPAM 1 MG PO TABS: 1.0000 mg | ORAL_TABLET | ORAL | Status: AC | PRN

## 2020-08-14 MED ORDER — DEXAMETHASONE SODIUM PHOSPHATE 10 MG/ML IJ SOLN
6.0000 mg | Freq: Once | INTRAMUSCULAR | Status: AC
  Administered 2020-08-14: 6 mg via INTRAVENOUS
  Filled 2020-08-14: qty 1

## 2020-08-14 MED ORDER — LEVETIRACETAM 500 MG PO TABS
500.0000 mg | ORAL_TABLET | Freq: Two times a day (BID) | ORAL | Status: DC
  Administered 2020-08-14 – 2020-08-18 (×8): 500 mg via ORAL
  Filled 2020-08-14 (×8): qty 1

## 2020-08-14 MED ORDER — THIAMINE HCL 100 MG PO TABS
100.0000 mg | ORAL_TABLET | Freq: Every day | ORAL | Status: DC
  Administered 2020-08-14 – 2020-08-18 (×5): 100 mg via ORAL
  Filled 2020-08-14 (×5): qty 1

## 2020-08-14 MED ORDER — SODIUM CHLORIDE 0.9 % IV SOLN
100.0000 mg | Freq: Two times a day (BID) | INTRAVENOUS | Status: DC
  Administered 2020-08-14 (×2): 100 mg via INTRAVENOUS
  Filled 2020-08-14 (×5): qty 100

## 2020-08-14 MED ORDER — SODIUM CHLORIDE 0.9 % IV SOLN
100.0000 mg | Freq: Every day | INTRAVENOUS | Status: AC
  Administered 2020-08-15 – 2020-08-18 (×4): 100 mg via INTRAVENOUS
  Filled 2020-08-14 (×4): qty 20

## 2020-08-14 MED ORDER — LORAZEPAM 2 MG/ML IJ SOLN: 0.0000 mg | Freq: Two times a day (BID) | INTRAMUSCULAR | Status: AC

## 2020-08-14 MED ORDER — SODIUM CHLORIDE 0.9% FLUSH
3.0000 mL | Freq: Two times a day (BID) | INTRAVENOUS | Status: DC
  Administered 2020-08-14 – 2020-08-18 (×8): 3 mL via INTRAVENOUS

## 2020-08-14 MED ORDER — HYDROCOD POLST-CPM POLST ER 10-8 MG/5ML PO SUER
5.0000 mL | Freq: Two times a day (BID) | ORAL | Status: DC | PRN
  Administered 2020-08-15 – 2020-08-17 (×2): 5 mL via ORAL
  Filled 2020-08-14 (×2): qty 5

## 2020-08-14 MED ORDER — ASCORBIC ACID 500 MG PO TABS
500.0000 mg | ORAL_TABLET | Freq: Every day | ORAL | Status: DC
  Administered 2020-08-14 – 2020-08-18 (×5): 500 mg via ORAL
  Filled 2020-08-14 (×5): qty 1

## 2020-08-14 MED ORDER — SODIUM CHLORIDE 0.9 % IV SOLN
2.0000 g | INTRAVENOUS | Status: DC
  Administered 2020-08-14: 2 g via INTRAVENOUS
  Filled 2020-08-14: qty 20

## 2020-08-14 MED ORDER — ADULT MULTIVITAMIN W/MINERALS CH
1.0000 | ORAL_TABLET | Freq: Every day | ORAL | Status: DC
  Administered 2020-08-15 – 2020-08-18 (×4): 1 via ORAL
  Filled 2020-08-14 (×4): qty 1

## 2020-08-14 MED ORDER — ALUM & MAG HYDROXIDE-SIMETH 200-200-20 MG/5ML PO SUSP: 30.0000 mL | ORAL | Status: DC | PRN

## 2020-08-14 MED ORDER — ALBUTEROL SULFATE HFA 108 (90 BASE) MCG/ACT IN AERS
2.0000 | INHALATION_SPRAY | Freq: Once | RESPIRATORY_TRACT | Status: AC
  Administered 2020-08-14: 2 via RESPIRATORY_TRACT
  Filled 2020-08-14: qty 6.7

## 2020-08-14 MED ORDER — ACETAMINOPHEN 325 MG PO TABS: 650.0000 mg | ORAL_TABLET | Freq: Four times a day (QID) | ORAL | Status: DC | PRN

## 2020-08-14 MED ORDER — THIAMINE HCL 100 MG/ML IJ SOLN: 100.0000 mg | Freq: Every day | INTRAMUSCULAR | Status: DC

## 2020-08-14 MED ORDER — LORAZEPAM 2 MG/ML IJ SOLN: 1.0000 mg | INTRAMUSCULAR | Status: AC | PRN

## 2020-08-14 MED ORDER — BACITRACIN-NEOMYCIN-POLYMYXIN 400-5-5000 EX OINT
TOPICAL_OINTMENT | Freq: Every day | CUTANEOUS | Status: DC
  Administered 2020-08-15: 1 via TOPICAL
  Filled 2020-08-14 (×3): qty 1

## 2020-08-14 MED ORDER — ZINC SULFATE 220 (50 ZN) MG PO CAPS
220.0000 mg | ORAL_CAPSULE | Freq: Every day | ORAL | Status: DC
  Administered 2020-08-14 – 2020-08-18 (×5): 220 mg via ORAL
  Filled 2020-08-14 (×6): qty 1

## 2020-08-14 MED ORDER — IOHEXOL 350 MG/ML SOLN
75.0000 mL | Freq: Once | INTRAVENOUS | Status: AC | PRN
  Administered 2020-08-14: 75 mL via INTRAVENOUS

## 2020-08-14 MED ORDER — SODIUM CHLORIDE 0.9 % IV BOLUS
500.0000 mL | Freq: Once | INTRAVENOUS | Status: AC
  Administered 2020-08-14: 500 mL via INTRAVENOUS

## 2020-08-14 MED ORDER — LORAZEPAM 2 MG/ML IJ SOLN: 0.0000 mg | Freq: Four times a day (QID) | INTRAMUSCULAR | Status: AC

## 2020-08-14 MED ORDER — ALBUTEROL SULFATE HFA 108 (90 BASE) MCG/ACT IN AERS
2.0000 | INHALATION_SPRAY | Freq: Four times a day (QID) | RESPIRATORY_TRACT | Status: DC
  Administered 2020-08-14 – 2020-08-15 (×6): 2 via RESPIRATORY_TRACT
  Filled 2020-08-14: qty 6.7

## 2020-08-14 MED ORDER — CARVEDILOL 3.125 MG PO TABS
3.1250 mg | ORAL_TABLET | Freq: Two times a day (BID) | ORAL | Status: DC
  Administered 2020-08-15 – 2020-08-18 (×8): 3.125 mg via ORAL
  Filled 2020-08-14 (×9): qty 1

## 2020-08-14 MED ORDER — ENOXAPARIN SODIUM 40 MG/0.4ML ~~LOC~~ SOLN
40.0000 mg | SUBCUTANEOUS | Status: DC
  Administered 2020-08-14 – 2020-08-18 (×5): 40 mg via SUBCUTANEOUS
  Filled 2020-08-14 (×5): qty 0.4

## 2020-08-14 MED ORDER — FOLIC ACID 1 MG PO TABS
1.0000 mg | ORAL_TABLET | Freq: Every day | ORAL | Status: DC
  Administered 2020-08-14 – 2020-08-18 (×5): 1 mg via ORAL
  Filled 2020-08-14 (×5): qty 1

## 2020-08-14 MED ORDER — GUAIFENESIN-DM 100-10 MG/5ML PO SYRP
10.0000 mL | ORAL_SOLUTION | ORAL | Status: DC | PRN
  Administered 2020-08-16: 10 mL via ORAL
  Filled 2020-08-14: qty 10

## 2020-08-14 MED ORDER — METHYLPREDNISOLONE SODIUM SUCC 40 MG IJ SOLR
0.5000 mg/kg | Freq: Two times a day (BID) | INTRAMUSCULAR | Status: DC
  Administered 2020-08-15 – 2020-08-18 (×7): 31.2 mg via INTRAVENOUS
  Filled 2020-08-14 (×8): qty 1

## 2020-08-14 MED ORDER — ATORVASTATIN CALCIUM 80 MG PO TABS
80.0000 mg | ORAL_TABLET | Freq: Every day | ORAL | Status: DC
  Administered 2020-08-15 – 2020-08-18 (×4): 80 mg via ORAL
  Filled 2020-08-14 (×4): qty 1

## 2020-08-14 NOTE — ED Provider Notes (Signed)
[facility] EMERGENCY DEPARTMENT Provider Note   CSN: [id number] Arrival date & time: 08/13/20  3016     History Chief Complaint  Patient presents with  . Cough    [name] is a 59 y.o. male presenting for evaluation of cough and shortness of breath.  Patient states he has not been feeling well recently.  He reports cough and shortness of breath.  Does not know if he has had fevers.  He states he feels lightheaded and weak, especially when standing.  He denies chest pain.  He denies nausea, vomiting, Donnell pain, urinary symptoms, normal bowel movements.  Patient states he thinks he has had a positive Covid test, but cannot confirm.  He reports no people in his home positive for Covid last week, they do not always wear masks.  Additional history obtained from chart review.  Patient gets weekly paramedic visits.  Per his last visit on 8-17, patient was found to be short of breath and weak.  He was encouraged to be evaluated at the time, but declined evaluation.  Per that note, he has not received his Covid vaccines.  He was presumed positive at that time.   Per cardiology note 06/22, pt continues to drink beer daily. He occasionally misses 1-2 days of his home meds.   Patient with a history of CAD, V. fib arrest in 2018 s/p ACID, CHF with an EF of 30, hypertension, hyperlipidemia, history of alcohol abuse, seizure.   HPI     Past Medical History:  Diagnosis Date  . AICD (automatic cardioverter/defibrillator) present   . Alcohol abuse 05/18/2020  . Arthritis    "hands; maybe my toes" (12/20/2017)  . CAD (coronary artery disease)   . Cardiac arrest (HCC) 11/29/2017   hx VF arrest/notes 12/20/2017  . Chronic systolic heart failure (HCC) 08/25/2019  . Coronary artery disease   . Coronary artery disease involving native coronary artery of native heart without angina pectoris 12/07/2017  . Essential hypertension 05/18/2020  . GERD (gastroesophageal reflux  disease)   . H/O ETOH abuse    /notes 12/20/2017  . High cholesterol   . Hypertension   . Ischemic cardiomyopathy    a. 08/2019 s/p BSX D232 single lead AICD.  Marland Kitchen Mixed hyperlipidemia 05/18/2020  . Seizure (HCC) 10/21/2019  . Seizures (HCC) ~ 2016; ~ 2017   "I've had a couple; I was at work" (12/20/2017)  . 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 Syncope and collapse    "last few days" (12/20/2017)    Patient Active Problem List   Diagnosis Date Noted  . Cardiac arrest, cause unspecified (HCC) 07/06/2020  . Left arm cellulitis 05/19/2020  . Essential hypertension 05/18/2020  . Mixed hyperlipidemia 05/18/2020  . Alcohol abuse 05/18/2020  . Cellulitis of left upper extremity 05/18/2020  . Lactic acidosis 05/18/2020  . 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 ICD (implantable cardioverter-defibrillator) in place 08/29/2019  . Chronic systolic heart failure (HCC) 08/25/2019  . 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 Coronary artery disease involving native coronary artery of native heart without angina pectoris 12/07/2017    Past Surgical History:  Procedure Laterality Date  . CARDIAC CATHETERIZATION    . CORONARY ARTERY BYPASS GRAFT N/A 12/07/2017   Procedure: CORONARY ARTERY BYPASS GRAFTING (CABG) times four using left internal mammary artery and right saphenous vein using endoscope for harvest.;  Surgeon: [name], [name], MD;  [facility];  Service: Open Heart Surgery;  Laterality: N/A;  . I & D EXTREMITY Left 05/20/2020   Procedure: IRRIGATION AND DEBRIDEMENT OF WRIST;  Surgeon: Ernest Mallickreighton, James J III, MD;  Location: [facility] OR;  Service: Orthopedics;  Laterality: Left;  . IABP INSERTION N/A 11/29/2017   Procedure: IABP Insertion;  Surgeon: [name], [name], MD;  Location: [facility] INVASIVE CV LAB;  Service: Cardiovascular;  Laterality:  N/A;  . IABP INSERTION N/A 12/03/2017   Procedure: IABP INSERTION;  Surgeon: Dolores PattyBensimhon, [name], MD;  Location: [facility] INVASIVE CV LAB;  Service: Cardiovascular;  Laterality: N/A;  . ICD IMPLANT N/A 08/29/2019   Procedure: ICD IMPLANT;  Surgeon: Marinus Mawaylor, Gregg W, MD;  [facility];  Service: Cardiovascular;  Laterality: N/A;  . LEFT HEART CATH AND CORONARY ANGIOGRAPHY N/A 11/29/2017   Procedure: LEFT HEART CATH AND CORONARY ANGIOGRAPHY;  Surgeon: [name], [name], MD;  [facility]: [facility];  Service: Cardiovascular;  Laterality: N/A;  . RIGHT HEART CATH N/A 11/29/2017   Procedure: RIGHT HEART CATH;  Surgeon: Dolores PattyBensimhon, [name], MD;  Location: [facility];  Service: Cardiovascular;  Laterality: N/A;  . TEE WITHOUT CARDIOVERSION N/A 12/07/2017   Procedure: TRANSESOPHAGEAL ECHOCARDIOGRAM (TEE);  Surgeon: [name], [name], MD;  [facility];  Service: Open Heart Surgery;  Laterality: N/A;  . TONSILLECTOMY AND ADENOIDECTOMY  ~ 1972       Family History  Problem Relation Age of Onset  . Heart attack Other   . Diabetes Neg Hx     Social History   Tobacco Use  . Smoking status: Never Smoker  . Smokeless tobacco: Never Used  Vaping Use  . Vaping Use: Never used  Substance Use Topics  . Alcohol use: Yes    Alcohol/week: 14.0 standard drinks    Types: 14 Cans of beer per week    Comment: patient endorses at least 1-2 beers daily (04/2020)  . Drug use: No    Home Medications Prior to Admission medications   Medication Sig Start Date End Date Taking? Authorizing Provider  aspirin EC 81 MG tablet Take 1 tablet (81 mg total) by mouth daily. Swallow whole. Patient not taking: Reported on 07/27/2020 07/06/20   Claiborne RiggFleming, Zelda W, NP  atorvastatin (LIPITOR) 80 MG tablet Take 1 tablet (80 mg total) by mouth daily at 6 PM. 06/15/20   Allayne ButcherSimmons, Brittainy M, PA-C  Blood Pressure Monitor DEVI Please provide patient with insurance approved blood pressure monitor. I10.0 Patient  not taking: Reported on 07/13/2020 07/06/20   Claiborne RiggFleming, Zelda W, NP  carvedilol (COREG) 3.125 MG tablet Take 1 tablet (3.125 mg total) by mouth 2 (two) times daily with a meal. 07/06/20   Clegg, Amy D, NP  carvedilol (COREG) 3.125 MG tablet Take 1 tablet (3.125 mg total) by mouth 2 (two) times daily with a meal. 07/13/20   Laurey MoraleMcLean, Dalton S, MD  ezetimibe (ZETIA) 10 MG tablet Take 1 tablet (10 mg total) by mouth daily. 07/06/20   Claiborne RiggFleming, Zelda W, NP  folic acid (FOLVITE) 1 MG tablet Take 1 tablet (1 mg total) by mouth daily. 04/06/20   Robbie LisSimmons, Brittainy M, PA-C  furosemide (LASIX) 40 MG tablet Take 1 tablet (40 mg total) by mouth daily as needed for fluid. Patient not taking: Reported on 07/20/2020 04/06/20   Robbie LisSimmons, Brittainy M, PA-C  ivabradine (CORLANOR) 5 MG TABS tablet Take 1 tablet (5 mg total) by mouth 2 (two) times daily with a  meal. Patient taking differently: Take 2.5 mg by mouth 2 (two) times daily with a meal.  04/21/20   [name], Bevelyn Buckles, MD  levETIRAcetam (KEPPRA) 500 MG tablet Take 1 tablet (500 mg total) by mouth 2 (two) times daily. 07/06/20   Claiborne Rigg, NP  sacubitril-valsartan (ENTRESTO) 24-26 MG Take 1 tablet by mouth 2 (two) times daily. 04/06/20   Allayne Butcher, PA-C  spironolactone (ALDACTONE) 25 MG tablet Take 1 tablet (25 mg total) by mouth daily. 04/06/20   Robbie Lis M, PA-C  thiamine 100 MG tablet Take 1 tablet (100 mg total) by mouth daily. 04/06/20   Allayne Butcher, PA-C    Allergies    Patient has no known allergies.  Review of Systems   Review of Systems  Respiratory: Positive for cough and shortness of breath.   Neurological: Positive for light-headedness.  All other systems reviewed and are negative.   Physical Exam Updated Vital Signs BP 127/87   Pulse 86   Temp 98 F (36.7 C) (Oral)   Resp (!) 32   Ht  (1.778 m)   Wt 62.6 kg   SpO2 97%   BMI 19.80 kg/m   Physical Exam Vitals and nursing note reviewed.    Constitutional:      Appearance: He is underweight. He is ill-appearing.     Comments: Appears ill  HENT:     Head: Normocephalic and atraumatic.  Eyes:     Extraocular Movements: Extraocular movements intact.     Conjunctiva/sclera: Conjunctivae normal.     Pupils: Pupils are equal, round, and reactive to light.  Cardiovascular:     Rate and Rhythm: Normal rate and regular rhythm.     Pulses: Normal pulses.  Pulmonary:     Effort: Pulmonary effort is normal. No respiratory distress.     Breath sounds: Normal breath sounds. No wheezing.     Comments: Clear lung sounds Abdominal:     General: There is no distension.     Palpations: Abdomen is soft. There is no mass.     Tenderness: There is no abdominal tenderness. There is no guarding or rebound.  Musculoskeletal:        General: Normal range of motion.     Cervical back: Normal range of motion and neck supple.     Right lower leg: No edema.     Left lower leg: No edema.  Skin:    General: Skin is warm and dry.     Capillary Refill: Capillary refill takes less than 2 seconds.  Neurological:     Mental Status: He is oriented to person, place, and time.     ED Results / Procedures / Treatments   Labs (all labs ordered are listed, but only abnormal results are displayed) Labs Reviewed  CBC WITH DIFFERENTIAL/PLATELET - Abnormal; Notable for the following components:      Result Value   Monocytes Absolute 1.1 (*)    All other components within normal limits  COMPREHENSIVE METABOLIC PANEL - Abnormal; Notable for the following components:   Sodium 130 (*)    Chloride 93 (*)    Glucose, Bld 115 (*)    Albumin 3.0 (*)    AST 71 (*)    All other components within normal limits  D-DIMER, QUANTITATIVE (NOT AT [facility]) - Abnormal; Notable for the following components:   D-Dimer, Quant 2.12 (*)    All other components within normal limits  FIBRINOGEN - Abnormal; Notable for the following components:  Fibrinogen 769 (*)    All  other components within normal limits  SARS CORONAVIRUS 2 BY RT PCR (HOSPITAL ORDER, PERFORMED IN [facility])  CULTURE, BLOOD (ROUTINE X 2)  CULTURE, BLOOD (ROUTINE X 2)  LACTIC ACID, PLASMA  LACTIC ACID, PLASMA  PROCALCITONIN  LACTATE DEHYDROGENASE  FERRITIN  TRIGLYCERIDES  C-REACTIVE PROTEIN  BRAIN NATRIURETIC PEPTIDE    EKG None  Radiology DG Chest Portable 1 View  Result Date: 08/13/2020 CLINICAL DATA:  Cough, COVID-19 EXAM: PORTABLE CHEST 1 VIEW COMPARISON:  Radiograph 03/28/2020, CT 11/29/2017 FINDINGS: Chronic mild elevation of the left hemidiaphragm. There is mixed patchy airspace and interstitial opacity in the right lung base. Additional dense bandlike opacity in the left lower lobe could reflect atelectasis though some surrounding hazy density could reflect superimposed infection as well. No pneumothorax or visible effusion. Prior sternotomy and features of CABG with stable cardiomediastinal contours including a calcified, tortuous aorta. Defibrillator pack overlies the left chest wall with single lead at the cardiac apex. Multitude of remote bilateral rib deformities. No acute osseous or soft tissue abnormality is seen. Degenerative changes are present in the imaged spine and shoulders. IMPRESSION: 1. Mixed patchy airspace and interstitial opacity in the right lung base, suspicious for pneumonia. 2. Dense bandlike opacity in the left lower lobe could reflect atelectasis though adjacent opacity may suggest developing airspace disease in this location as well. Electronically Signed   By: Kreg Shropshire M.D.   On: 08/13/2020 19:21    Procedures Procedures (including critical care time)  Medications Ordered in ED Medications  sodium chloride 0.9 % bolus 500 mL (0 mLs Intravenous Stopped 08/14/20 0631)  albuterol (VENTOLIN HFA) 108 (90 Base) MCG/ACT inhaler 2 puff (2 puffs Inhalation Given 08/14/20 0624)  dexamethasone (DECADRON) injection 6 mg (6 mg Intravenous Given  08/14/20 2952)    ED Course  I have reviewed the triage vital signs and the nursing notes.  Pertinent labs & imaging results that were available during my care of the patient were reviewed by me and considered in my medical decision making (see chart for details).    MDM Rules/Calculators/A&P                          Presenting for evaluation of cough and shortness of breath.  On exam, patient appears ill.  He appears short of breath, speaking in short sentences.  Sats stable on room air.  I personally ambulated patient in the room, he did not become hypoxic, however he became very tachycardic with a heart rate in the 120s, tachypneic close to 40.  Additionally, blood pressure went from 140 systolic to 105 systolic. Pt reports weakness and lightheadedness with standing and ambulation.  As such, concern for PE.  Concern for severe Covid infection.  Concern for dehydration.  Labs obtained from triage interpreted by me, overall reassuring.  No significant electrolyte derangement.  Chest x-ray viewed interpreted by me, shows opacity in both lobes.  Per radiology, consider atelectasis versus pneumonia.  Will obtain CTA to rule out PE.  Will obtain Covid inflammatory markers.  Small, 500 cc bolus given for dehydration.  Albuterol and Decadron given for shortness of breath and Covid symptoms.  Patient will likely need to be admitted due to his concerning medical history and his weakness/lightheadedness with significant vital sign change.  Pt signed out to K ford, PA-C for f/u on CTA and likely admission.   Final Clinical Impression(s) / ED Diagnoses Final  diagnoses:  None    Rx / DC Orders ED Discharge Orders    None       Alveria Apley, PA-C 08/14/20 6295    Mesner, Barbara Cower, MD 08/14/20 [phone]

## 2020-08-14 NOTE — ED Provider Notes (Signed)
Care assumed from [facility], please see her note for full details, but in brief [name] is a 59 y.o. male with history of CAD, V. fib arrest,  ischemic cardiomyopathy, and alcohol abuse, who presented to the ED for cough and shortness of breath, recently not feeling well, thought he may have tested positive for Covid but could not confirm this.  States he has been in contact with multiple people with Covid and does not routinely wear a mask.  Patient was not hypoxic but had significant tachypnea, tachycardia and increased work of breathing with any movement or activity.   Work-up so far has showed no leukocytosis, mild hyponatremia and hypochloremia suggestive of dehydration, no other significant electrolyte derangements.  Covid test is pending but multiple inflammatory markers are elevated and chest x-ray suggestive of Covid pneumonia.  CTA of the chest pending to rule out PE given significant tachycardia with activity.  Anticipate admission  BP 106/79   Pulse 92   Temp 98 F (36.7 C) (Oral)   Resp (!) 28   Ht 5\' 10"  (1.778 m)   Wt 62.6 kg   SpO2 97%   BMI 19.80 kg/m   ED Course/Procedures   Labs Reviewed  SARS CORONAVIRUS 2 BY RT PCR (HOSPITAL ORDER, PERFORMED IN [facility]) - Abnormal; Notable for the following components:      Result Value   SARS Coronavirus 2 POSITIVE (*)    All other components within normal limits  CBC WITH DIFFERENTIAL/PLATELET - Abnormal; Notable for the following components:   Monocytes Absolute 1.1 (*)    All other components within normal limits  COMPREHENSIVE METABOLIC PANEL - Abnormal; Notable for the following components:   Sodium 130 (*)    Chloride 93 (*)    Glucose, Bld 115 (*)    Albumin 3.0 (*)    AST 71 (*)    All other components within normal limits  D-DIMER, QUANTITATIVE (NOT AT [facility]) - Abnormal; Notable for the following components:   D-Dimer, Quant 2.12 (*)    All other components within normal limits   LACTATE DEHYDROGENASE - Abnormal; Notable for the following components:   LDH 261 (*)    All other components within normal limits  FERRITIN - Abnormal; Notable for the following components:   Ferritin 644 (*)    All other components within normal limits  FIBRINOGEN - Abnormal; Notable for the following components:   Fibrinogen 769 (*)    All other components within normal limits  C-REACTIVE PROTEIN - Abnormal; Notable for the following components:   CRP 13.0 (*)    All other components within normal limits  BRAIN NATRIURETIC PEPTIDE - Abnormal; Notable for the following components:   B Natriuretic Peptide 202.3 (*)    All other components within normal limits  CULTURE, BLOOD (ROUTINE X 2)  CULTURE, BLOOD (ROUTINE X 2)  LACTIC ACID, PLASMA  TRIGLYCERIDES  LACTIC ACID, PLASMA  PROCALCITONIN    CT Angio Chest PE W and/or Wo Contrast  Result Date: 08/14/2020 CLINICAL DATA:  COVID positive patient. Worsening cough and shortness of breath. EXAM: CT ANGIOGRAPHY CHEST WITH CONTRAST TECHNIQUE: Multidetector CT imaging of the chest was performed using the standard protocol during bolus administration of intravenous contrast. Multiplanar CT image reconstructions and MIPs were obtained to evaluate the vascular anatomy. CONTRAST:  64mL OMNIPAQUE IOHEXOL 350 MG/ML SOLN COMPARISON:  Chest x-ray August 13, 2020. Noncontrast chest CT November 29, 2017. FINDINGS: Cardiovascular: Three-vessel coronary artery disease identified. The heart  size remains borderline. The thoracic aorta is unremarkable. No pulmonary emboli. Mediastinum/Nodes: The thyroid and esophagus are normal. No effusions. 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 Prominence posterior to the pacemaker is identified. Bilateral gynecomastia, relatively mild. No adenopathy. Lungs/Pleura: Central airways are normal. No pneumothorax. Patchy peripheral ground-glass opacities involving all lobes but most prominent the bases is consistent with the  patient's reported history COVID-19. More focal consolidation is seen in the right base on series 9, image 75. No suspicious nodules or masses. Upper Abdomen: No acute abnormality. Musculoskeletal: No chest wall abnormality. No acute or significant osseous findings. Review of the MIP images confirms the above findings. IMPRESSION: 1. No pulmonary emboli. 2. Patchy primarily ground-glass opacities involving all lobes but most prominent in the periphery of the bases is consistent with COVID-19 pneumonia given history. More dense consolidation is seen in the right base. 3. The left pacemaker appears to be displaced anteriorly by either a fluid collection which could represent a postoperative seroma given placement of the implant a year ago. Prominent musculature or an abscess are not excluded on limited imaging of this location. Streak artifact limits evaluation in this location. Recommend clinical correlation. 4. Three-vessel coronary artery disease. Electronically Signed   By: Gerome Sam III M.D   On: 08/14/2020 08:11   DG Chest Portable 1 View  Result Date: 08/13/2020 CLINICAL DATA:  Cough, COVID-19 EXAM: PORTABLE CHEST 1 VIEW COMPARISON:  Radiograph 03/28/2020, CT 11/29/2017 FINDINGS: Chronic mild elevation of the left hemidiaphragm. There is mixed patchy airspace and interstitial opacity in the right lung base. Additional dense bandlike opacity in the left lower lobe could reflect atelectasis though some surrounding hazy density could reflect superimposed infection as well. No pneumothorax or visible effusion. Prior sternotomy and features of CABG with stable cardiomediastinal contours including a calcified, tortuous aorta. Defibrillator pack overlies the left chest wall with single lead at the cardiac apex. Multitude of remote bilateral rib deformities. No acute osseous or soft tissue abnormality is seen. Degenerative changes are present in the imaged spine and shoulders. IMPRESSION: 1. Mixed patchy  airspace and interstitial opacity in the right lung base, suspicious for pneumonia. 2. Dense bandlike opacity in the left lower lobe could reflect atelectasis though adjacent opacity may suggest developing airspace disease in this location as well. Electronically Signed   By: Kreg Shropshire M.D.   On: 08/13/2020 19:21    Procedures  MDM    Patient with Covid pneumonia, CT with no evidence of PE.  There is a possible fluid collection with some anterior displacement of the patient's defibrillator, question whether this could be seroma, he does not have overlying erythema or pain to suggest infection.  Despite patient's O2 sats remaining stable, with any activity patient become significantly tachycardic and tachypneic with significant Covid pneumonia on CT feel he will require admission, he has been treated with small IV fluid bolus and steroids as well as bronchodilators.  Given that patient does not currently have true oxygen requirement will hold off on remdesivir and defer to admitting team for further treatment of COVID-19 infection.  Case discussed with Dr. Madelyn Flavors with [facility] who will see and admit the patient.      Dartha Lodge, PA-C 08/14/20 0919    Marily Memos, MD 08/15/20 [phone]

## 2020-08-14 NOTE — H&P (Signed)
History and Physical    [name]:[id number] DOB: [birth year] DOA: 08/13/2020  Referring MD/NP/PA: Jodi Geralds, PA-C PCP: Claiborne Rigg, NP  Patient coming from: Home  Chief Complaint: Cough and shortness of breath  I have personally briefly reviewed patient's old medical records in [facility]   HPI: [name] is a 59 y.o. male with medical history significant of CAD s/p CABG, ICM s/p AICD, V. fib arrest, systolic CHF, HTN, HLD, seizures, and history of alcohol abuse presents with complaints of cough and shortness of breath.  He reports testing positive for COVID-19 on 8/14 while he was in jail, but reports that charges were dropped and he was released.  He reports that his cough is nonproductive and that he is short of breath with any movement or exertion.  Noted associated symptoms of chills, congestion, decreased appetite, diarrhea, myalgias, and generalized fatigue. Denies any known fever, nausea, vomiting, leg swelling, or dysuria symptoms.  He had not received any COVID-19 vaccines.  ED Course: On admission into the emergency department patient was seen to be afebrile, pulse 86-1 08, respirations 17-32, and all other vital signs maintained.  Labs significant for CBC within normal limits, sodium 130, albumin 3, AST 71, BNP 202.3, LDH 261, ferritin 644, CRP 13, lactic acid 1.9, D-dimer 2.12, fibrinogen 769 and procalcitonin 1.31.  COVID-19 screening was positive.  CT angiogram revealed multifocal pneumonia with more dense consolidation seen in the right lung base, and left pacemaker was displaced anteriorly with fluid collection.  Patient had been given 500 mL of IV fluids, 6 mg of Decadron, and albuterol inhaler.  TRH called to admit.  Review of Systems  Constitutional: Positive for diaphoresis and malaise/fatigue. Negative for fever.  Eyes: Negative for double vision and photophobia.  Respiratory: Positive for cough and shortness of breath.   Cardiovascular:  Negative for leg swelling.  Gastrointestinal: Positive for diarrhea. Negative for nausea and vomiting.  Genitourinary: Negative for dysuria and hematuria.  Neurological: Negative for loss of consciousness.    Past Medical History:  Diagnosis Date  . AICD (automatic cardioverter/defibrillator) present   . Alcohol abuse 05/18/2020  . Arthritis    "hands; maybe my toes" (12/20/2017)  . CAD (coronary artery disease)   . Cardiac arrest (HCC) 11/29/2017   hx VF arrest/notes 12/20/2017  . Chronic systolic heart failure (HCC) 08/25/2019  . Coronary artery disease   . Coronary artery disease involving native coronary artery of native heart without angina pectoris 12/07/2017  . Essential hypertension 05/18/2020  . GERD (gastroesophageal reflux disease)   . H/O ETOH abuse    /notes 12/20/2017  . High cholesterol   . Hypertension   . Ischemic cardiomyopathy    a. 08/2019 s/p BSX D232 single lead AICD.  Marland Kitchen Mixed hyperlipidemia 05/18/2020  . Seizure (HCC) 10/21/2019  . Seizures (HCC) ~ 2016; ~ 2017   "I've had a couple; I was at work" (12/20/2017)  . 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 Syncope and collapse    "last few days" (12/20/2017)    Past Surgical History:  Procedure Laterality Date  . CARDIAC CATHETERIZATION    . CORONARY ARTERY BYPASS GRAFT N/A 12/07/2017   Procedure: CORONARY ARTERY BYPASS GRAFTING (CABG) times four using left internal mammary artery and right saphenous vein using endoscope for harvest.;  Surgeon: [name], MD;  Location: [facility] OR;  Service: Open Heart Surgery;  Laterality: N/A;  . I &  D EXTREMITY Left 05/20/2020   Procedure: IRRIGATION AND DEBRIDEMENT OF WRIST;  Surgeon: Ernest Mallickreighton, James J III, MD;  [facility];  Service: Orthopedics;  Laterality: Left;  . IABP INSERTION N/A 11/29/2017   Procedure: IABP Insertion;  Surgeon: [name], [name], MD;  Location: [facility] INVASIVE CV  LAB;  Service: Cardiovascular;  Laterality: N/A;  . IABP INSERTION N/A 12/03/2017   Procedure: IABP INSERTION;  Surgeon: Dolores PattyBensimhon, [name], MD;  Location: [facility] INVASIVE CV LAB;  Service: Cardiovascular;  Laterality: N/A;  . ICD IMPLANT N/A 08/29/2019   Procedure: ICD IMPLANT;  Surgeon: Marinus Mawaylor, Gregg W, MD;  [facility];  Service: Cardiovascular;  Laterality: N/A;  . LEFT HEART CATH AND CORONARY ANGIOGRAPHY N/A 11/29/2017   Procedure: LEFT HEART CATH AND CORONARY ANGIOGRAPHY;  Surgeon: [name], [name], MD;  Location: [facility] INVAS[facility] CV LAB;  Service: Cardiovascular;  Laterality: N/A;  . RIGHT HEART CATH N/A 11/29/2017   Procedure: RIGHT HEART CATH;  Surgeon: Dolores PattyBensimhon, [name], MD;  Location: [facility] CV LAB;  Service: Cardiovascular;  Laterality: N/A;  . TEE WITHOUT CARDIOVERSION N/A 12/07/2017   Procedure: TRANSESOPHAGEAL ECHOCARDIOGRAM (TEE);  Surgeon: [name], [name], MD;  [facility];  Service: Open Heart Surgery;  Laterality: N/A;  . TONSILLECTOMY AND ADENOIDECTOMY  ~ 1972     reports that he has never smoked. He has never used smokeless tobacco. He reports current alcohol use of about 14.0 standard drinks of alcohol per week. He reports that he does not use drugs.  No Known Allergies  Family History  Problem Relation Age of Onset  . Heart attack Other   . Diabetes Neg Hx     Prior to Admission medications   Medication Sig Start Date End Date Taking? Authorizing Provider  aspirin EC 81 MG tablet Take 1 tablet (81 mg total) by mouth daily. Swallow whole. Patient not taking: Reported on 07/27/2020 07/06/20   Claiborne RiggFleming, Zelda W, NP  atorvastatin (LIPITOR) 80 MG tablet Take 1 tablet (80 mg total) by mouth daily at 6 PM. 06/15/20   Allayne ButcherSimmons, Brittainy M, PA-C  Blood Pressure Monitor DEVI Please provide patient with insurance approved blood pressure monitor. I10.0 Patient not taking: Reported on 07/13/2020 07/06/20   Claiborne RiggFleming, Zelda W, NP  carvedilol (COREG) 3.125 MG tablet  Take 1 tablet (3.125 mg total) by mouth 2 (two) times daily with a meal. 07/06/20   Clegg, Amy D, NP  carvedilol (COREG) 3.125 MG tablet Take 1 tablet (3.125 mg total) by mouth 2 (two) times daily with a meal. 07/13/20   Laurey MoraleMcLean, Dalton S, MD  ezetimibe (ZETIA) 10 MG tablet Take 1 tablet (10 mg total) by mouth daily. 07/06/20   Claiborne RiggFleming, Zelda W, NP  folic acid (FOLVITE) 1 MG tablet Take 1 tablet (1 mg total) by mouth daily. 04/06/20   Robbie LisSimmons, Brittainy M, PA-C  furosemide (LASIX) 40 MG tablet Take 1 tablet (40 mg total) by mouth daily as needed for fluid. Patient not taking: Reported on 07/20/2020 04/06/20   Robbie LisSimmons, Brittainy M, PA-C  ivabradine (CORLANOR) 5 MG TABS tablet Take 1 tablet (5 mg total) by mouth 2 (two) times daily with a meal. Patient taking differently: Take 2.5 mg by mouth 2 (two) times daily with a meal.  04/21/20   [name], Bevelyn Bucklesaniel R, MD  levETIRAcetam (KEPPRA) 500 MG tablet Take 1 tablet (500 mg total) by mouth 2 (two) times daily. 07/06/20   Claiborne RiggFleming, Zelda W, NP  sacubitril-valsartan (ENTRESTO) 24-26 MG Take 1 tablet by mouth  2 (two) times daily. 04/06/20   Allayne Butcher, PA-C  spironolactone (ALDACTONE) 25 MG tablet Take 1 tablet (25 mg total) by mouth daily. 04/06/20   Robbie Lis M, PA-C  thiamine 100 MG tablet Take 1 tablet (100 mg total) by mouth daily. 04/06/20   Allayne Butcher, PA-C    Physical Exam:  Constitutional: NAD, calm, comfortable Vitals:   08/14/20 0800 08/14/20 0815 08/14/20 0830 08/14/20 0845  BP: 104/69 108/74 104/81 123/72  Pulse: 88 86 86 84  Resp: (!) 32 (!) 35 (!) 33 (!) 27  Temp:      TempSrc:      SpO2: 92% 95% 93% 93%  Weight:      Height:       Eyes: PERRL, lids and conjunctivae normal ENMT: Mucous membranes are moist. Posterior pharynx clear of any exudate or lesions.Normal dentition.  Neck: normal, supple, no masses, no thyromegaly Respiratory: clear to auscultation bilaterally, no wheezing, no crackles. Normal respiratory  effort. No accessory muscle use.  Cardiovascular: Regular rate and rhythm, no murmurs / rubs / gallops. No extremity edema. 2+ pedal pulses. No carotid bruits.  Abdomen: no tenderness, no masses palpated. No hepatosplenomegaly. Bowel sounds positive.  Musculoskeletal: Clubbing present. No joint deformity upper and lower extremities. Good ROM, no contractures. Normal muscle tone.  Skin: Bruising noted of the right index finger. Abrasion noted of the medial aspect of the left dorsal forefoot with mild surrounding male. Neurologic: CN 2-12 grossly intact. Sensation intact, DTR normal. Strength 5/5 in all 4.  Psychiatric: Normal judgment and insight. Alert and oriented x 3. Normal mood.     Labs on Admission: I have personally reviewed following labs and imaging studies  CBC: Recent Labs  Lab 08/13/20 1856  WBC 6.6  NEUTROABS 4.8  HGB 14.5  HCT 43.8  MCV 97.3  PLT 186   Basic Metabolic Panel: Recent Labs  Lab 08/13/20 1856  NA 130*  K 4.5  CL 93*  CO2 25  GLUCOSE 115*  BUN 13  CREATININE 1.00  CALCIUM 8.9   GFR: Estimated Creatinine Clearance: 72.2 mL/min (by C-G formula based on SCr of 1 mg/dL). Liver Function Tests: Recent Labs  Lab 08/13/20 1856  AST 71*  ALT 39  ALKPHOS 52  BILITOT 0.6  PROT 7.2  ALBUMIN 3.0*   No results for input(s): LIPASE, AMYLASE in the last 168 hours. No results for input(s): AMMONIA in the last 168 hours. Coagulation Profile: No results for input(s): INR, PROTIME in the last 168 hours. Cardiac Enzymes: No results for input(s): CKTOTAL, CKMB, CKMBINDEX, TROPONINI in the last 168 hours. BNP (last 3 results) No results for input(s): PROBNP in the last 8760 hours. HbA1C: No results for input(s): HGBA1C in the last 72 hours. CBG: No results for input(s): GLUCAP in the last 168 hours. Lipid Profile: Recent Labs    08/14/20 0525  TRIG 120   Thyroid Function Tests: No results for input(s): TSH, T4TOTAL, FREET4, T3FREE, THYROIDAB in  the last 72 hours. Anemia Panel: Recent Labs    08/14/20 0525  FERRITIN 644*   Urine analysis:    Component Value Date/Time   COLORURINE COLORLESS (A) 01/08/2020 1922   APPEARANCEUR CLEAR 01/08/2020 1922   LABSPEC 1.002 (L) 01/08/2020 1922   PHURINE 7.0 01/08/2020 1922   GLUCOSEU NEGATIVE 01/08/2020 1922   HGBUR NEGATIVE 01/08/2020 1922   BILIRUBINUR NEGATIVE 01/08/2020 1922   KETONESUR NEGATIVE 01/08/2020 1922   PROTEINUR NEGATIVE 01/08/2020 1922   NITRITE NEGATIVE 01/08/2020 1922  LEUKOCYTESUR NEGATIVE 01/08/2020 1922   Sepsis Labs: Recent Results (from the past 240 hour(s))  SARS Coronavirus 2 by RT PCR (hospital order, performed in [facility] hospital lab) Nasopharyngeal Nasopharyngeal Swab     Status: Abnormal   Collection Time: 08/14/20  6:00 AM   Specimen: Nasopharyngeal Swab  Result Value Ref Range Status   SARS Coronavirus 2 POSITIVE (A) NEGATIVE Final    Comment: RESULT CALLED TO, READ BACK BY AND VERIFIED WITH: RN E. [id number] [id number] FCP (NOTE) SARS-CoV-2 target nucleic acids are DETECTED  SARS-CoV-2 RNA is generally detectable in upper respiratory specimens  during the acute phase of infection.  Positive results are indicative  of the presence of the identified virus, but do not rule out bacterial infection or co-infection with other pathogens not detected by the test.  Clinical correlation with patient history and  other diagnostic information is necessary to determine patient infection status.  The expected result is negative.  Fact Sheet for Patients:   BoilerBrush.com.cy   Fact Sheet for Healthcare Providers:   https://pope.com/    This test is not yet approved or cleared by the [place] FDA and  has been authorized for detection and/or diagnosis of SARS-CoV-2 by FDA under an Emergency Use Authorization (EUA).  This EUA will remain in effect (meaning this test can b e used) for the duration of    the COVID-19 declaration under Section 564(b)(1) of the Act, 21 U.S.C. section 360-bbb-3(b)(1), unless the authorization is terminated or revoked sooner.  Performed at [facility], [address], [place], [place] [address]      Radiological Exams on Admission: CT Angio Chest PE W and/or Wo Contrast  Result Date: 08/14/2020 CLINICAL DATA:  COVID positive patient. Worsening cough and shortness of breath. EXAM: CT ANGIOGRAPHY CHEST WITH CONTRAST TECHNIQUE: Multidetector CT imaging of the chest was performed using the standard protocol during bolus administration of intravenous contrast. Multiplanar CT image reconstructions and MIPs were obtained to evaluate the vascular anatomy. CONTRAST:  40mL OMNIPAQUE IOHEXOL 350 MG/ML SOLN COMPARISON:  Chest x-ray August 13, 2020. Noncontrast chest CT November 29, 2017. FINDINGS: Cardiovascular: Three-vessel coronary artery disease identified. The heart size remains borderline. The thoracic aorta is unremarkable. No pulmonary emboli. Mediastinum/Nodes: The thyroid and esophagus are normal. No effusions. 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 Prominence posterior to the pacemaker is identified. Bilateral gynecomastia, relatively mild. No adenopathy. Lungs/Pleura: Central airways are normal. No pneumothorax. Patchy peripheral ground-glass opacities involving all lobes but most prominent the bases is consistent with the patient's reported history COVID-19. More focal consolidation is seen in the right base on series 9, image 75. No suspicious nodules or masses. Upper Abdomen: No acute abnormality. Musculoskeletal: No chest wall abnormality. No acute or significant osseous findings. Review of the MIP images confirms the above findings. IMPRESSION: 1. No pulmonary emboli. 2. Patchy primarily ground-glass opacities involving all lobes but most prominent in the periphery of the bases is consistent with COVID-19 pneumonia given history. More dense consolidation  is seen in the right base. 3. The left pacemaker appears to be displaced anteriorly by either a fluid collection which could represent a postoperative seroma given placement of the implant a year ago. Prominent musculature or an abscess are not excluded on limited imaging of this location. Streak artifact limits evaluation in this location. Recommend clinical correlation. 4. Three-vessel coronary artery disease. Electronically Signed   By: Gerome Sam III M.D   On: 08/14/2020 08:11   DG Chest  Portable 1 View  Result Date: 08/13/2020 CLINICAL DATA:  Cough, COVID-19 EXAM: PORTABLE CHEST 1 VIEW COMPARISON:  Radiograph 03/28/2020, CT 11/29/2017 FINDINGS: Chronic mild elevation of the left hemidiaphragm. There is mixed patchy airspace and interstitial opacity in the right lung base. Additional dense bandlike opacity in the left lower lobe could reflect atelectasis though some surrounding hazy density could reflect superimposed infection as well. No pneumothorax or visible effusion. Prior sternotomy and features of CABG with stable cardiomediastinal contours including a calcified, tortuous aorta. Defibrillator pack overlies the left chest wall with single lead at the cardiac apex. Multitude of remote bilateral rib deformities. No acute osseous or soft tissue abnormality is seen. Degenerative changes are present in the imaged spine and shoulders. IMPRESSION: 1. Mixed patchy airspace and interstitial opacity in the right lung base, suspicious for pneumonia. 2. Dense bandlike opacity in the left lower lobe could reflect atelectasis though adjacent opacity may suggest developing airspace disease in this location as well. Electronically Signed   By: Kreg Shropshire M.D.   On: 08/13/2020 19:21    EKG: Independently reviewed.  Sinus rhythm at 89 bpm with PVC and QTc 499  Assessment/Plan Sepsis Pneumonia due to COVID-19: Acute.  Patient presents with complaints of shortness of breath and cough.  COVID-19 screen  positive.  Imaging studies reveal a multifocal pneumonia with dense consolidation in the right lower lobe.  Inflammatory markers were elevated including procalcitonin. 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 CTA of the chest did reveal signs that the fluid collection was displaced anterior by fluid collection. His last echocardiogram noted to show EF of 30 to 35% with global hypokinesis and grade 1 diastolic dysfunction in 12/2019. -Strict intake and output -Daily weights -May need to notify cardiology if blood cultures become positive  Hyponatremia: Acute.  Sodium level noted to be 130 on admission.  Given poor p.o. intake suspect possibly hypovolemic in nature versus related to patient's history of alcohol abuse. -Continue to monitor sodium level   Prolonged QT interval: Acute.  QTc prolonged at 499 on admission. -Correct any electrolyte abnormalities -Avoid QT prolonging medication -Recheck EKG in a.m.  Elevated AST: AST 71 on admission. Suspect likely related with patient's history of alcohol abuse.  -Continue to monitor  Alcohol abuse: Patient reports drinking alcohol, but does not specifically quantify how much and how often. -CIWA protocols initiated  Abnormality of the right index finger/abrasion of the left foot: Patient reports sustaining injury to the forefoot after wearing flip-flops. Unclear of what happened to his right index finger. -Continue to monitor finger -Routine wound care for the left foot with  Neosporin  Seizure disorder -Continue Keppra  DVT prophylaxis: Lovenox Code Status: Full Family Communication: Unable to reach sister over the phone Disposition Plan: Likely discharge home over the next 2 to 3 days if respiratory status improves Consults called: None Admission status: Inpatient  Clydie Braun MD [facility] Pager [phone]   If 7PM-7AM, please contact night-coverage [email or web address] Password [facility]  08/14/2020, 9:13 AM

## 2020-08-14 NOTE — TOC Initial Note (Signed)
Transition of Care [facility]) - Initial/Assessment Note    Patient Details  Name: [name] MRN: [id number] Date of Birth: 21-May-1961  [facility] CM/SW Contact:    Lockie Pares, RN Phone Number: 08/14/2020, 5:40 PM  Clinical Narrative:                 Patient admitted for COVID [facility] patient has long history of heart issues EF 30% ETOH daily . 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 Last week he was feeling bad, short of breath but declined to come in, he was presumed positive at that time. NO in with worsened symptoms. He is reiving iV medications for COVID and supplemental o2 . CM will be following for needs.   Expected Discharge Plan: Home w Home Health Services Barriers to Discharge: Continued Medical Work up   Patient Goals and CMS Choice        Expected Discharge Plan and Services Expected Discharge Plan: Home w Home Health Services       Living arrangements for the past 2 months: Single Family Home                                      Prior Living Arrangements/Services Living arrangements for the past 2 months: Single Family Home   Patient language and need for interpreter reviewed:: Yes Do you feel safe going back to the place where you live?: Yes      Need for Family Participation in Patient Care: Yes (Comment) Care giver support system in place?: Yes (comment)   Criminal Activity/Legal Involvement Pertinent to Current Situation/Hospitalization: No - Comment as needed  Activities of Daily Living      Permission Sought/Granted Permission sought to share information with : Case Manager                Emotional Assessment       Orientation: : Oriented to Self, Oriented to Place, Oriented to  Time, Oriented to Situation Alcohol / Substance Use: Not Applicable Psych Involvement: No (comment)  Admission diagnosis:  Pneumonia due to COVID-19 virus [U07.1,  J12.82] Patient Active Problem List   Diagnosis Date Noted  . Pneumonia due to COVID-19 virus 08/14/2020  . Cardiac arrest, cause unspecified (HCC) 07/06/2020  . Left arm cellulitis 05/19/2020  . Essential hypertension 05/18/2020  . Mixed hyperlipidemia 05/18/2020  . Alcohol abuse 05/18/2020  . Cellulitis of left upper extremity 05/18/2020  . Lactic acidosis 05/18/2020  . 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 ICD (implantable cardioverter-defibrillator) in place 08/29/2019  . Chronic systolic heart failure (HCC) 08/25/2019  . 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 Coronary artery disease involving native coronary artery of native heart without angina pectoris 12/07/2017   PCP:  Claiborne Rigg, NP Pharmacy:   [facility] - [place], [place] - [address] [facility] [address] [place] [address] Phone: [phone] Fax: [phone]  [facility] [place], [place] - [address] [place] [address] Phone: [phone] Fax: [phone]  [facility] - [place], [place] - [address] [place] [address] [place] [address] Phone: [phone] Fax: [phone]     Social Determinants of Health (SDOH) Interventions  Readmission Risk Interventions No flowsheet data found.

## 2020-08-14 NOTE — ED Notes (Signed)
Dinner tray delivered.

## 2020-08-15 ENCOUNTER — Encounter (HOSPITAL_COMMUNITY): Payer: Self-pay | Admitting: Internal Medicine

## 2020-08-15 ENCOUNTER — Inpatient Hospital Stay (HOSPITAL_COMMUNITY): Payer: Medicaid Other

## 2020-08-15 DIAGNOSIS — T888XXD Other specified complications of surgical and medical care, not elsewhere classified, subsequent encounter: Secondary | ICD-10-CM

## 2020-08-15 LAB — COMPREHENSIVE METABOLIC PANEL
ALT: 31 U/L (ref 0–44)
AST: 39 U/L (ref 15–41)
Albumin: 2.4 g/dL — ABNORMAL LOW (ref 3.5–5.0)
Alkaline Phosphatase: 39 U/L (ref 38–126)
Anion gap: 10 (ref 5–15)
BUN: 13 mg/dL (ref 6–20)
CO2: 24 mmol/L (ref 22–32)
Calcium: 8.6 mg/dL — ABNORMAL LOW (ref 8.9–10.3)
Chloride: 99 mmol/L (ref 98–111)
Creatinine, Ser: 0.69 mg/dL (ref 0.61–1.24)
GFR calc Af Amer: >60 mL/min (ref >60)
GFR calc non Af Amer: >60 mL/min (ref >60)
Glucose, Bld: 151 mg/dL — ABNORMAL HIGH (ref 70–99)
Potassium: 4.2 mmol/L (ref 3.5–5.1)
Sodium: 133 mmol/L — ABNORMAL LOW (ref 135–145)
Total Bilirubin: 0.5 mg/dL (ref 0.3–1.2)
Total Protein: 6.1 g/dL — ABNORMAL LOW (ref 6.5–8.1)

## 2020-08-15 LAB — CBC WITH DIFFERENTIAL/PLATELET
Abs Immature Granulocytes: 0.06 10*3/uL (ref 0.00–0.07)
Basophils Absolute: 0 10*3/uL (ref 0.0–0.1)
Basophils Relative: 0 %
Eosinophils Absolute: 0 10*3/uL (ref 0.0–0.5)
Eosinophils Relative: 0 %
HCT: 38 % — ABNORMAL LOW (ref 39.0–52.0)
Hemoglobin: 12.7 g/dL — ABNORMAL LOW (ref 13.0–17.0)
Immature Granulocytes: 1 %
Lymphocytes Relative: 11 %
Lymphs Abs: 0.6 10*3/uL — ABNORMAL LOW (ref 0.7–4.0)
MCH: 32.9 pg (ref 26.0–34.0)
MCHC: 33.4 g/dL (ref 30.0–36.0)
MCV: 98.4 fL (ref 80.0–100.0)
Monocytes Absolute: 1.1 10*3/uL — ABNORMAL HIGH (ref 0.1–1.0)
Monocytes Relative: 22 %
Neutro Abs: 3.2 10*3/uL (ref 1.7–7.7)
Neutrophils Relative %: 66 %
Platelets: 183 10*3/uL (ref 150–400)
RBC: 3.86 MIL/uL — ABNORMAL LOW (ref 4.22–5.81)
RDW: 12 % (ref 11.5–15.5)
WBC: 4.9 10*3/uL (ref 4.0–10.5)
nRBC: 0 % (ref 0.0–0.2)

## 2020-08-15 LAB — MAGNESIUM: Magnesium: 1.8 mg/dL (ref 1.7–2.4)

## 2020-08-15 LAB — D-DIMER, QUANTITATIVE: D-Dimer, Quant: 0.87 ug/mL-FEU — ABNORMAL HIGH (ref 0.00–0.50)

## 2020-08-15 LAB — STREP PNEUMONIAE URINARY ANTIGEN: Strep Pneumo Urinary Antigen: NEGATIVE

## 2020-08-15 LAB — C-REACTIVE PROTEIN: CRP: 7.4 mg/dL — ABNORMAL HIGH (ref <1.0)

## 2020-08-15 LAB — PHOSPHORUS: Phosphorus: 2.9 mg/dL (ref 2.5–4.6)

## 2020-08-15 LAB — FERRITIN: Ferritin: 529 ng/mL — ABNORMAL HIGH (ref 24–336)

## 2020-08-15 MED ORDER — ALBUTEROL SULFATE HFA 108 (90 BASE) MCG/ACT IN AERS: 2.0000 | INHALATION_SPRAY | Freq: Four times a day (QID) | RESPIRATORY_TRACT | Status: DC | PRN

## 2020-08-15 MED ORDER — ALBUTEROL SULFATE HFA 108 (90 BASE) MCG/ACT IN AERS: 2.0000 | INHALATION_SPRAY | Freq: Two times a day (BID) | RESPIRATORY_TRACT | Status: DC

## 2020-08-15 MED ORDER — PNEUMOCOCCAL VAC POLYVALENT 25 MCG/0.5ML IJ INJ
0.5000 mL | INJECTION | INTRAMUSCULAR | Status: DC
  Filled 2020-08-15 (×2): qty 0.5

## 2020-08-15 MED ORDER — SODIUM CHLORIDE 0.9 % IV SOLN: 2.0000 g | INTRAVENOUS | Status: DC

## 2020-08-15 NOTE — ED Notes (Signed)
Pt given Sprite, per Kriste Basque - RN.

## 2020-08-15 NOTE — Progress Notes (Addendum)
PROGRESS NOTE  [name] IBB:[id number] DOB: 05-29-1961 DOA: 08/13/2020 PCP: Claiborne Rigg, NP   LOS: 1 day   Brief Narrative / Interim history: 59 year old male with history of CAD status post CABG, ICM s/p ICD, V. fib arrest, systolic CHF, HTN, HLD, seizures, history of alcohol abuse came in with cough and shortness of breath.  He tested positive on 8/14 for COVID-19 when he was in jail, got released, felt more short of breath and dyspneic at home and decided to come to the hospital.  Subjective / 24h Interval events: He is doing well, appreciates that his shortness of breath has gotten a little bit better.  Still coughing some.  No chest pain, no abdominal pain, no nausea or vomiting.  Assessment & Plan:  Principal Problem Acute Hypoxic Respiratory Failure due to Covid-19 Viral Illness -Minimally hypoxic on admission, 2 L nasal cannula, this morning he is on room air satting in the upper 80s which is acceptable.  Imaging with CT chest on admission showed groundglass opacities throughout all lobes consistent with COVID-19 pneumonia.  Images personally reviewed -Patient was started on remdesivir, today's date 2, scheduled to be done on Wednesday 8/25 -patient was started on steroids, continue -No need for antibacterials, will discontinue doxycycline and ceftriaxone -No need for baricitinib currently, also not a candidate with suspicion of infected AICD pocket  COVID-19 Labs  Recent Labs    08/14/20 0525 08/15/20 0500  DDIMER 2.12* 0.87*  FERRITIN 644* 529*  LDH 261*  --   CRP 13.0* 7.4*    Lab Results  Component Value Date   SARSCOV2NAA POSITIVE (A) 08/14/2020   SARSCOV2NAA NEGATIVE 05/18/2020   SARSCOV2NAA NEGATIVE 01/08/2020   SARSCOV2NAA NEGATIVE 10/21/2019   Active Problems Combined systolic and diastolic CHF with history of ischemic cardiomyopathy, AICD in place -patient appears euvolemic on exam.  Keep on the dry side.  CT scan of the chest showed a  possible fluid collection that may displace the device anteriorly.  He himself noticed that the device has been protruding more but he thinks it is because he lost weight.  Skin overlying it is normal, no signs of erythema, there is no tenderness on pocket palpation.  Blood cultures have been obtained and will monitor.  Discussed with general cardiology, they reviewed the case with EP.  Obtain ultrasound to better determine if it is fluid versus artifact -Of note, he was hospitalized 2-1/2 months ago with left forearm abscess with strep pyogenes requiring surgical intervention by hand surgery x2, but the blood cultures remain negative at that time  Alcohol abuse-patient with history of alcohol abuse, last time he was hospitalized was on my service as well and during withdrawal phase he became confused, combative, requiring Ativan around-the-clock as well as restraints and sitter.  Closely monitor.  Discussed with RN.  Continue CIWA  Hyponatremia-sodium stable, likely due to alcohol use/hypovolemic.  Monitor  Elevated LFTs-due to alcohol  Prolonged QT-monitor  Seizure disorder -Continue Keppra   Scheduled Meds:  albuterol  2 puff Inhalation Q6H   vitamin C  500 mg Oral Daily   atorvastatin  80 mg Oral q1800   carvedilol  3.125 mg Oral BID WC   enoxaparin (LOVENOX) injection  40 mg Subcutaneous Q24H   feeding supplement (ENSURE ENLIVE)  237 mL Oral BID BM   folic acid  1 mg Oral Daily   levETIRAcetam  500 mg Oral BID   LORazepam  0-4 mg Intravenous Q6H   Followed by   Melene Muller  ON 08/16/2020] LORazepam  0-4 mg Intravenous Q12H   methylPREDNISolone (SOLU-MEDROL) injection  0.5 mg/kg Intravenous Q12H   multivitamin with minerals  1 tablet Oral Daily   neomycin-bacitracin-polymyxin   Topical Daily   sodium chloride flush  3 mL Intravenous Q12H   thiamine  100 mg Oral Daily   Or   thiamine  100 mg Intravenous Daily   zinc sulfate  220 mg Oral Daily   Continuous Infusions:   remdesivir 100 mg in NS 100 mL 100 mg (08/15/20 1026)   PRN Meds:.acetaminophen, alum & mag hydroxide-simeth, chlorpheniramine-HYDROcodone, guaiFENesin-dextromethorphan, LORazepam **OR** LORazepam  DVT prophylaxis: Lovenox Code Status: Full code Family Communication: no family at bedside   Status is: Inpatient  Remains inpatient appropriate because:Inpatient level of care appropriate due to severity of illness   Dispo: The patient is from: Home              Anticipated d/c is to: Home              Anticipated d/c date is: 3 days              Patient currently is not medically stable to d/c.  Consultants:  None   Procedures:  None   Microbiology: Blood cultures 8/21 - NGTD  Antibacterials: None    Objective: Vitals:   08/15/20 1000 08/15/20 1015 08/15/20 1019 08/15/20 1030  BP: 110/82  110/82 130/78  Pulse: 91 78 72 66  Resp: (!) 23 (!) 25  (!) 27  Temp:      TempSrc:      SpO2: 90% 97%  98%  Weight:      Height:        Intake/Output Summary (Last 24 hours) at 08/15/2020 1037 Last data filed at 08/15/2020 0110 Gross per 24 hour  Intake 619.51 ml  Output 700 ml  Net -80.49 ml   Filed Weights   08/14/20 0554  Weight: 62.6 kg    Examination:  Constitutional: NAD Eyes: no scleral icterus ENMT: Mucous membranes are moist.  Neck: normal, supple Respiratory: Bibasilar rhonchi, no crackles, no wheezing.  Increased respiratory effort with mild tachypnea Cardiovascular: Regular rate and rhythm, no murmurs / rubs / gallops. No LE edema. Good peripheral pulses Abdomen: non distended, no tenderness. Bowel sounds positive.  Musculoskeletal: no clubbing / cyanosis.  Skin: no rashes.  ICD site clean, no erythema, no tenderness on palpation Neurologic: CN 2-12 grossly intact. Strength 5/5 in all 4.  Psychiatric: Normal judgment and insight. Alert and oriented x 3. Normal mood.    Data Reviewed: I have independently reviewed following labs and imaging studies    CBC: Recent Labs  Lab 08/13/20 1856 08/15/20 0500  WBC 6.6 4.9  NEUTROABS 4.8 3.2  HGB 14.5 12.7*  HCT 43.8 38.0*  MCV 97.3 98.4  PLT 186 183   Basic Metabolic Panel: Recent Labs  Lab 08/13/20 1856 08/14/20 1018 08/15/20 0500  NA 130* 130* 133*  K 4.5 4.1 4.2  CL 93* 96* 99  CO2 25 24 24   GLUCOSE 115* 154* 151*  BUN 13 11 13   CREATININE 1.00 0.84 0.69  CALCIUM 8.9 8.2* 8.6*  MG  --  1.7 1.8  PHOS  --   --  2.9   GFR: Estimated Creatinine Clearance: 90.2 mL/min (by C-G formula based on SCr of 0.69 mg/dL). Liver Function Tests: Recent Labs  Lab 08/13/20 1856 08/14/20 1018 08/15/20 0500  AST 71* 47* 39  ALT 39 32 31  ALKPHOS 52 39  39  BILITOT 0.6 0.6 0.5  PROT 7.2 6.2* 6.1*  ALBUMIN 3.0* 2.5* 2.4*   No results for input(s): LIPASE, AMYLASE in the last 168 hours. No results for input(s): AMMONIA in the last 168 hours. Coagulation Profile: No results for input(s): INR, PROTIME in the last 168 hours. Cardiac Enzymes: No results for input(s): CKTOTAL, CKMB, CKMBINDEX, TROPONINI in the last 168 hours. BNP (last 3 results) No results for input(s): PROBNP in the last 8760 hours. HbA1C: No results for input(s): HGBA1C in the last 72 hours. CBG: No results for input(s): GLUCAP in the last 168 hours. Lipid Profile: Recent Labs    08/14/20 0525  TRIG 120   Thyroid Function Tests: No results for input(s): TSH, T4TOTAL, FREET4, T3FREE, THYROIDAB in the last 72 hours. Anemia Panel: Recent Labs    08/14/20 0525 08/15/20 0500  FERRITIN 644* 529*   Urine analysis:    Component Value Date/Time   COLORURINE COLORLESS (A) 01/08/2020 1922   APPEARANCEUR CLEAR 01/08/2020 1922   LABSPEC 1.002 (L) 01/08/2020 1922   PHURINE 7.0 01/08/2020 1922   GLUCOSEU NEGATIVE 01/08/2020 1922   HGBUR NEGATIVE 01/08/2020 1922   BILIRUBINUR NEGATIVE 01/08/2020 1922   KETONESUR NEGATIVE 01/08/2020 1922   PROTEINUR NEGATIVE 01/08/2020 1922   NITRITE NEGATIVE 01/08/2020 1922    LEUKOCYTESUR NEGATIVE 01/08/2020 1922   Sepsis Labs: Invalid input(s): PROCALCITONIN, LACTICIDVEN  Recent Results (from the past 240 hour(s))  Blood Culture (routine x 2)     Status: None (Preliminary result)   Collection Time: 08/14/20  5:40 AM   Specimen: BLOOD  Result Value Ref Range Status   Specimen Description BLOOD LEFT ANTECUBITAL  Final   Special Requests   Final    BOTTLES DRAWN AEROBIC AND ANAEROBIC Blood Culture adequate volume   Culture   Final    NO GROWTH 1 DAY Performed at [facility], [address] [place], [place] [address]    Report Status PENDING  Incomplete  Blood Culture (routine x 2)     Status: None (Preliminary result)   Collection Time: 08/14/20  5:45 AM   Specimen: BLOOD  Result Value Ref Range Status   Specimen Description BLOOD RIGHT ANTECUBITAL  Final   Special Requests   Final    BOTTLES DRAWN AEROBIC AND ANAEROBIC Blood Culture adequate volume   Culture   Final    NO GROWTH 1 DAY Performed at [facility], [address], [place], [place] [address]    Report Status PENDING  Incomplete  SARS Coronavirus 2 by RT PCR (hospital order, performed in [facility] hospital lab) Nasopharyngeal Nasopharyngeal Swab     Status: Abnormal   Collection Time: 08/14/20  6:00 AM   Specimen: Nasopharyngeal Swab  Result Value Ref Range Status   SARS Coronavirus 2 POSITIVE (A) NEGATIVE Final    Comment: RESULT CALLED TO, READ BACK BY AND VERIFIED WITH: RN E. [id number] FCP (NOTE) SARS-CoV-2 target nucleic acids are DETECTED  SARS-CoV-2 RNA is generally detectable in upper respiratory specimens  during the acute phase of infection.  Positive results are indicative  of the presence of the identified virus, but do not rule out bacterial infection or co-infection with other pathogens not detected by the test.  Clinical correlation with patient history and  other diagnostic information is necessary to determine patient infection status.  The  expected result is negative.  Fact Sheet for Patients:   BoilerBrush.com.cy   Fact Sheet for Healthcare Providers:   https://pope.com/  This test is not yet approved or cleared by the [place] and  has been authorized for detection and/or diagnosis of SARS-CoV-2 by FDA under an Emergency Use Authorization (EUA).  This EUA will remain in effect (meaning this test can b e used) for the duration of  the COVID-19 declaration under Section 564(b)(1) of the Act, 21 U.S.C. section 360-bbb-3(b)(1), unless the authorization is terminated or revoked sooner.  Performed at [facility], [address], [place], [place] [address]       Radiology Studies: CT Angio Chest PE W and/or Wo Contrast  Result Date: 08/14/2020 CLINICAL DATA:  COVID positive patient. Worsening cough and shortness of breath. EXAM: CT ANGIOGRAPHY CHEST WITH CONTRAST TECHNIQUE: Multidetector CT imaging of the chest was performed using the standard protocol during bolus administration of intravenous contrast. Multiplanar CT image reconstructions and MIPs were obtained to evaluate the vascular anatomy. CONTRAST:  69mL OMNIPAQUE IOHEXOL 350 MG/ML SOLN COMPARISON:  Chest x-ray August 13, 2020. Noncontrast chest CT November 29, 2017. FINDINGS: Cardiovascular: Three-vessel coronary artery disease identified. The heart size remains borderline. The thoracic aorta is unremarkable. No pulmonary emboli. Mediastinum/Nodes: The thyroid and esophagus are normal. No effusions. 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 Prominence posterior to the pacemaker is identified. Bilateral gynecomastia, relatively mild. No adenopathy. Lungs/Pleura: Central airways are normal. No pneumothorax. Patchy peripheral ground-glass opacities involving all lobes but most prominent the bases is consistent with the patient's reported history COVID-19. More focal consolidation is seen in the right base on  series 9, image 75. No suspicious nodules or masses. Upper Abdomen: No acute abnormality. Musculoskeletal: No chest wall abnormality. No acute or significant osseous findings. Review of the MIP images confirms the above findings. IMPRESSION: 1. No pulmonary emboli. 2. Patchy primarily ground-glass opacities involving all lobes but most prominent in the periphery of the bases is consistent with COVID-19 pneumonia given history. More dense consolidation is seen in the right base. 3. The left pacemaker appears to be displaced anteriorly by either a fluid collection which could represent a postoperative seroma given placement of the implant a year ago. Prominent musculature or an abscess are not excluded on limited imaging of this location. Streak artifact limits evaluation in this location. Recommend clinical correlation. 4. Three-vessel coronary artery disease. Electronically Signed   By: Gerome Sam III M.D   On: 08/14/2020 08:11   DG Chest Portable 1 View  Result Date: 08/13/2020 CLINICAL DATA:  Cough, COVID-19 EXAM: PORTABLE CHEST 1 VIEW COMPARISON:  Radiograph 03/28/2020, CT 11/29/2017 FINDINGS: Chronic mild elevation of the left hemidiaphragm. There is mixed patchy airspace and interstitial opacity in the right lung base. Additional dense bandlike opacity in the left lower lobe could reflect atelectasis though some surrounding hazy density could reflect superimposed infection as well. No pneumothorax or visible effusion. Prior sternotomy and features of CABG with stable cardiomediastinal contours including a calcified, tortuous aorta. Defibrillator pack overlies the left chest wall with single lead at the cardiac apex. Multitude of remote bilateral rib deformities. No acute osseous or soft tissue abnormality is seen. Degenerative changes are present in the imaged spine and shoulders. IMPRESSION: 1. Mixed patchy airspace and interstitial opacity in the right lung base, suspicious for pneumonia. 2. Dense  bandlike opacity in the left lower lobe could reflect atelectasis though adjacent opacity may suggest developing airspace disease in this location as well. Electronically Signed   By: Kreg Shropshire M.D.   On: 08/13/2020 19:21    Luvinia Lucy Elvera Lennox,  MD, PhD [facility]  Between 7 am - 7 pm I am available, please contact me via Amion or Securechat  Between 7 pm - 7 am I am not available, please contact night coverage MD/APP via Amion

## 2020-08-15 NOTE — ED Notes (Signed)
Pt eating breakfast 

## 2020-08-16 LAB — COMPREHENSIVE METABOLIC PANEL
ALT: 24 U/L (ref 0–44)
AST: 24 U/L (ref 15–41)
Albumin: 2.1 g/dL — ABNORMAL LOW (ref 3.5–5.0)
Alkaline Phosphatase: 36 U/L — ABNORMAL LOW (ref 38–126)
Anion gap: 9 (ref 5–15)
BUN: 16 mg/dL (ref 6–20)
CO2: 28 mmol/L (ref 22–32)
Calcium: 8.9 mg/dL (ref 8.9–10.3)
Chloride: 96 mmol/L — ABNORMAL LOW (ref 98–111)
Creatinine, Ser: 0.88 mg/dL (ref 0.61–1.24)
GFR calc Af Amer: >60 mL/min (ref >60)
GFR calc non Af Amer: >60 mL/min (ref >60)
Glucose, Bld: 162 mg/dL — ABNORMAL HIGH (ref 70–99)
Potassium: 4.3 mmol/L (ref 3.5–5.1)
Sodium: 133 mmol/L — ABNORMAL LOW (ref 135–145)
Total Bilirubin: 0.6 mg/dL (ref 0.3–1.2)
Total Protein: 5.5 g/dL — ABNORMAL LOW (ref 6.5–8.1)

## 2020-08-16 LAB — FERRITIN: Ferritin: 358 ng/mL — ABNORMAL HIGH (ref 24–336)

## 2020-08-16 LAB — C-REACTIVE PROTEIN: CRP: 3.1 mg/dL — ABNORMAL HIGH (ref <1.0)

## 2020-08-16 LAB — CBC WITH DIFFERENTIAL/PLATELET
Abs Immature Granulocytes: 0.08 10*3/uL — ABNORMAL HIGH (ref 0.00–0.07)
Basophils Absolute: 0 10*3/uL (ref 0.0–0.1)
Basophils Relative: 0 %
Eosinophils Absolute: 0 10*3/uL (ref 0.0–0.5)
Eosinophils Relative: 0 %
HCT: 34.5 % — ABNORMAL LOW (ref 39.0–52.0)
Hemoglobin: 11.6 g/dL — ABNORMAL LOW (ref 13.0–17.0)
Immature Granulocytes: 1 %
Lymphocytes Relative: 6 %
Lymphs Abs: 0.5 10*3/uL — ABNORMAL LOW (ref 0.7–4.0)
MCH: 32.4 pg (ref 26.0–34.0)
MCHC: 33.6 g/dL (ref 30.0–36.0)
MCV: 96.4 fL (ref 80.0–100.0)
Monocytes Absolute: 0.6 10*3/uL (ref 0.1–1.0)
Monocytes Relative: 8 %
Neutro Abs: 6.2 10*3/uL (ref 1.7–7.7)
Neutrophils Relative %: 85 %
Platelets: 211 10*3/uL (ref 150–400)
RBC: 3.58 MIL/uL — ABNORMAL LOW (ref 4.22–5.81)
RDW: 11.7 % (ref 11.5–15.5)
WBC: 7.3 10*3/uL (ref 4.0–10.5)
nRBC: 0 % (ref 0.0–0.2)

## 2020-08-16 LAB — MAGNESIUM: Magnesium: 1.7 mg/dL (ref 1.7–2.4)

## 2020-08-16 LAB — D-DIMER, QUANTITATIVE: D-Dimer, Quant: 0.58 ug/mL-FEU — ABNORMAL HIGH (ref 0.00–0.50)

## 2020-08-16 LAB — LEGIONELLA PNEUMOPHILA SEROGP 1 UR AG: L. pneumophila Serogp 1 Ur Ag: NEGATIVE

## 2020-08-16 LAB — PHOSPHORUS: Phosphorus: 3.2 mg/dL (ref 2.5–4.6)

## 2020-08-16 LAB — BRAIN NATRIURETIC PEPTIDE: B Natriuretic Peptide: 571.3 pg/mL — ABNORMAL HIGH (ref 0.0–100.0)

## 2020-08-16 MED ORDER — SACUBITRIL-VALSARTAN 24-26 MG PO TABS
1.0000 | ORAL_TABLET | Freq: Two times a day (BID) | ORAL | Status: DC
  Administered 2020-08-16 – 2020-08-18 (×4): 1 via ORAL
  Filled 2020-08-16 (×7): qty 1

## 2020-08-16 NOTE — Progress Notes (Signed)
PROGRESS NOTE  [name] RAQ:[id number] DOB: 1961/11/11 DOA: 08/13/2020 PCP: Claiborne Rigg, NP   LOS: 2 days   Brief Narrative / Interim history: 59 year old male with history of CAD status post CABG, ICM s/p ICD, V. fib arrest, systolic CHF, HTN, HLD, seizures, history of alcohol abuse came in with cough and shortness of breath.  He tested positive on 8/14 for COVID-19 when he was in jail, got released, felt more short of breath and dyspneic at home and decided to come to the hospital.  Subjective / 24h Interval events: Doing well this morning, shortness of breath is improved.  No chest pain, no abdominal pain, no nausea or vomiting.  Assessment & Plan:  Principal Problem Acute Hypoxic Respiratory Failure due to Covid-19 Viral Illness -Minimally hypoxic on admission requiring 2 L nasal cannula.  Imaging with CT chest on admission showed groundglass opacities throughout all lobes consistent with COVID-19 pneumonia.  Images personally reviewed.  He is on room air this morning -Patient was started on remdesivir, today is day 3, scheduled to be done on Wednesday 8/25 -patient was started on steroids, continue -If he remains on room air over the next 24 hours may be a candidate to go home on Tuesday but will need outpatient transportation from home to the Remdesivir infusion center on Wednesday, contacted social worker  COVID-19 Labs  Recent Labs    08/14/20 0525 08/15/20 0500 08/16/20 0421  DDIMER 2.12* 0.87* 0.58*  FERRITIN 644* 529* 358*  LDH 261*  --   --   CRP 13.0* 7.4* 3.1*    Lab Results  Component Value Date   SARSCOV2NAA POSITIVE (A) 08/14/2020   SARSCOV2NAA NEGATIVE 05/18/2020   SARSCOV2NAA NEGATIVE 01/08/2020   SARSCOV2NAA NEGATIVE 10/21/2019   Active Problems Combined systolic and diastolic CHF with history of ischemic cardiomyopathy, AICD in place -patient appears euvolemic on exam.  Keep on the dry side.  CT scan of the chest showed a possible fluid  collection that may displace the device anteriorly.  This was followed by ultrasound which did not confirm a fluid collection and may be patient positioning/muscle asymmetry.  Clinically there is no evidence of infection.   Alcohol abuse-patient with history of alcohol abuse, last time he was hospitalized was on my service as well and during withdrawal phase he became confused, combative, requiring Ativan around-the-clock as well as restraints and sitter.  Closely monitor.  Does not appear to trigger CIWA and actually does not appear to be withdrawing this morning.  Continue to monitor  Hyponatremia-sodium stable, likely due to alcohol use/hypovolemic.  Monitor  Elevated LFTs-due to alcohol  Prolonged QT-monitor  Seizure disorder -Continue Keppra   Scheduled Meds: . vitamin C  500 mg Oral Daily  . atorvastatin  80 mg Oral q1800  . carvedilol  3.125 mg Oral BID WC  . enoxaparin (LOVENOX) injection  40 mg Subcutaneous Q24H  . feeding supplement (ENSURE ENLIVE)  237 mL Oral BID BM  . folic acid  1 mg Oral Daily  . levETIRAcetam  500 mg Oral BID  . LORazepam  0-4 mg Intravenous Q6H   Followed by  . LORazepam  0-4 mg Intravenous Q12H  . methylPREDNISolone (SOLU-MEDROL) injection  0.5 mg/kg Intravenous Q12H  . multivitamin with minerals  1 tablet Oral Daily  . neomycin-bacitracin-polymyxin   Topical Daily  . pneumococcal 23 valent vaccine  0.5 mL Intramuscular Tomorrow-1000  . sacubitril-valsartan  1 tablet Oral BID  . sodium chloride flush  3 mL Intravenous Q12H  .  thiamine  100 mg Oral Daily   Or  . thiamine  100 mg Intravenous Daily  . zinc sulfate  220 mg Oral Daily   Continuous Infusions: . remdesivir 100 mg in NS 100 mL Stopped (08/15/20 1100)   PRN Meds:.acetaminophen, albuterol, alum & mag hydroxide-simeth, chlorpheniramine-HYDROcodone, guaiFENesin-dextromethorphan, LORazepam **OR** LORazepam  DVT prophylaxis: Lovenox Code Status: Full code Family Communication: no family  at bedside   Status is: Inpatient  Remains inpatient appropriate because:Inpatient level of care appropriate due to severity of illness   Dispo: The patient is from: Home              Anticipated d/c is to: Home              Anticipated d/c date is: 3 days              Patient currently is not medically stable to d/c.  Consultants:  None   Procedures:  None   Microbiology: Blood cultures 8/21 - NGTD  Antibacterials: None    Objective: Vitals:   08/15/20 2145 08/15/20 2211 08/16/20 0211 08/16/20 0400  BP:  122/80 108/69 129/77  Pulse: (!) 56 (!) 58 63 (!) 55  Resp: (!) 22 18 15 17   Temp:  97.8 F (36.6 C) 98.2 F (36.8 C) 97.8 F (36.6 C)  TempSrc:  Oral Oral Oral  SpO2: 97% 100% 95% 95%  Weight:      Height:        Intake/Output Summary (Last 24 hours) at 08/16/2020 0944 Last data filed at 08/16/2020 0803 Gross per 24 hour  Intake 100 ml  Output 1850 ml  Net -1750 ml   Filed Weights   08/14/20 0554  Weight: 62.6 kg    Examination:  Constitutional: No distress, in chair Eyes: No scleral icterus ENMT: Mucous membranes are moist.  Neck: normal, supple Respiratory: Faint rhonchi, no crackles, no wheezing, normal respiratory effort Cardiovascular: Regular rate and rhythm, no murmurs appreciated.  No peripheral edema Abdomen: Soft, NT, ND, bowel sounds positive Musculoskeletal: no clubbing / cyanosis.  Skin: No new rashes.  ICD site clean, no erythema, no tenderness on palpation Neurologic: Nonfocal   Data Reviewed: I have independently reviewed following labs and imaging studies   CBC: Recent Labs  Lab 08/13/20 1856 08/15/20 0500 08/16/20 0421  WBC 6.6 4.9 7.3  NEUTROABS 4.8 3.2 6.2  HGB 14.5 12.7* 11.6*  HCT 43.8 38.0* 34.5*  MCV 97.3 98.4 96.4  PLT 186 183 211   Basic Metabolic Panel: Recent Labs  Lab 08/13/20 1856 08/14/20 1018 08/15/20 0500 08/16/20 0421  NA 130* 130* 133* 133*  K 4.5 4.1 4.2 4.3  CL 93* 96* 99 96*  CO2 25 24 24  28   GLUCOSE 115* 154* 151* 162*  BUN 13 11 13 16   CREATININE 1.00 0.84 0.69 0.88  CALCIUM 8.9 8.2* 8.6* 8.9  MG  --  1.7 1.8 1.7  PHOS  --   --  2.9 3.2   GFR: Estimated Creatinine Clearance: 82 mL/min (by C-G formula based on SCr of 0.88 mg/dL). Liver Function Tests: Recent Labs  Lab 08/13/20 1856 08/14/20 1018 08/15/20 0500 08/16/20 0421  AST 71* 47* 39 24  ALT 39 32 31 24  ALKPHOS 52 39 39 36*  BILITOT 0.6 0.6 0.5 0.6  PROT 7.2 6.2* 6.1* 5.5*  ALBUMIN 3.0* 2.5* 2.4* 2.1*   No results for input(s): LIPASE, AMYLASE in the last 168 hours. No results for input(s): AMMONIA in the last 168  hours. Coagulation Profile: No results for input(s): INR, PROTIME in the last 168 hours. Cardiac Enzymes: No results for input(s): CKTOTAL, CKMB, CKMBINDEX, TROPONINI in the last 168 hours. BNP (last 3 results) No results for input(s): PROBNP in the last 8760 hours. HbA1C: No results for input(s): HGBA1C in the last 72 hours. CBG: No results for input(s): GLUCAP in the last 168 hours. Lipid Profile: Recent Labs    08/14/20 0525  TRIG 120   Thyroid Function Tests: No results for input(s): TSH, T4TOTAL, FREET4, T3FREE, THYROIDAB in the last 72 hours. Anemia Panel: Recent Labs    08/15/20 0500 08/16/20 0421  FERRITIN 529* 358*   Urine analysis:    Component Value Date/Time   COLORURINE COLORLESS (A) 01/08/2020 1922   APPEARANCEUR CLEAR 01/08/2020 1922   LABSPEC 1.002 (L) 01/08/2020 1922   PHURINE 7.0 01/08/2020 1922   GLUCOSEU NEGATIVE 01/08/2020 1922   HGBUR NEGATIVE 01/08/2020 1922   BILIRUBINUR NEGATIVE 01/08/2020 1922   KETONESUR NEGATIVE 01/08/2020 1922   PROTEINUR NEGATIVE 01/08/2020 1922   NITRITE NEGATIVE 01/08/2020 1922   LEUKOCYTESUR NEGATIVE 01/08/2020 1922   Sepsis Labs: Invalid input(s): PROCALCITONIN, LACTICIDVEN  Recent Results (from the past 240 hour(s))  Blood Culture (routine x 2)     Status: None (Preliminary result)   Collection Time: 08/14/20   5:40 AM   Specimen: BLOOD  Result Value Ref Range Status   Specimen Description BLOOD LEFT ANTECUBITAL  Final   Special Requests   Final    BOTTLES DRAWN AEROBIC AND ANAEROBIC Blood Culture adequate volume   Culture   Final    NO GROWTH 2 DAYS Performed at [facility], [address] [place], [place] [address]    Report Status PENDING  Incomplete  Blood Culture (routine x 2)     Status: None (Preliminary result)   Collection Time: 08/14/20  5:45 AM   Specimen: BLOOD  Result Value Ref Range Status   Specimen Description BLOOD RIGHT ANTECUBITAL  Final   Special Requests   Final    BOTTLES DRAWN AEROBIC AND ANAEROBIC Blood Culture adequate volume   Culture   Final    NO GROWTH 2 DAYS Performed at [facility], [address], [place], [place] [address]    Report Status PENDING  Incomplete  SARS Coronavirus 2 by RT PCR (hospital order, performed in [facility] hospital lab) Nasopharyngeal Nasopharyngeal Swab     Status: Abnormal   Collection Time: 08/14/20  6:00 AM   Specimen: Nasopharyngeal Swab  Result Value Ref Range Status   SARS Coronavirus 2 POSITIVE (A) NEGATIVE Final    Comment: RESULT CALLED TO, READ BACK BY AND VERIFIED WITH: RN E. FWYOVZCHY [id number] FCP (NOTE) SARS-CoV-2 target nucleic acids are DETECTED  SARS-CoV-2 RNA is generally detectable in upper respiratory specimens  during the acute phase of infection.  Positive results are indicative  of the presence of the identified virus, but do not rule out bacterial infection or co-infection with other pathogens not detected by the test.  Clinical correlation with patient history and  other diagnostic information is necessary to determine patient infection status.  The expected result is negative.  Fact Sheet for Patients:   BoilerBrush.com.cy   Fact Sheet for Healthcare Providers:   https://pope.com/    This test is not yet approved or cleared by the  [place] FDA and  has been authorized for detection and/or diagnosis of SARS-CoV-2 by FDA under an Emergency Use Authorization (EUA).  This EUA will  remain in effect (meaning this test can b e used) for the duration of  the COVID-19 declaration under Section 564(b)(1) of the Act, 21 U.S.C. section 360-bbb-3(b)(1), unless the authorization is terminated or revoked sooner.  Performed at [facility], [address], [place], [place] [address]       Radiology Studies: [place] CHEST SOFT TISSUE  Result Date: 08/15/2020 CLINICAL DATA:  Evaluate for abscess beneath pacemaker. EXAM: ULTRASOUND OF HEAD/NECK SOFT TISSUES TECHNIQUE: Ultrasound examination of the head and neck soft tissues was performed in the area of clinical concern. COMPARISON:  CT pulmonary angiogram August 14, 2020 FINDINGS: Due to the pacemaker, it is difficult to see directly behind the pacemaker. However, the musculature immediately adjacent to the pacemaker is normal in appearance. IMPRESSION: It is difficult to see immediately behind the pacemaker on ultrasound or CT imaging. However, the musculature and soft tissue surrounding the pacemaker are completely normal on today's study. I suspect the prominence on the CT pulmonary angiogram is due to asymmetric muscular thickening due to the overlying pacemaker and patient positioning. Electronically Signed   By: Gerome Sam III M.D   On: 08/15/2020 12:15    Pamella Pert, MD, PhD [facility]  Between 7 am - 7 pm I am available, please contact me via Amion or Securechat  Between 7 pm - 7 am I am not available, please contact night coverage MD/APP via Amion

## 2020-08-16 NOTE — Plan of Care (Signed)
  Problem: Education: Goal: Knowledge of General Education information will improve Description: Including pain rating scale, medication(s)/side effects and non-pharmacologic comfort measures Outcome: Progressing   Problem: Clinical Measurements: Goal: Diagnostic test results will improve Outcome: Progressing Goal: Respiratory complications will improve Outcome: Progressing   Problem: Activity: Goal: Risk for activity intolerance will decrease Outcome: Progressing   Problem: Safety: Goal: Ability to remain free from injury will improve Outcome: Progressing   Problem: Education: Goal: Knowledge of risk factors and measures for prevention of condition will improve Outcome: Progressing   Problem: Respiratory: Goal: Will maintain a patent airway Outcome: Progressing   

## 2020-08-17 LAB — MAGNESIUM: Magnesium: 1.8 mg/dL (ref 1.7–2.4)

## 2020-08-17 LAB — COMPREHENSIVE METABOLIC PANEL
ALT: 22 U/L (ref 0–44)
AST: 20 U/L (ref 15–41)
Albumin: 2.1 g/dL — ABNORMAL LOW (ref 3.5–5.0)
Alkaline Phosphatase: 39 U/L (ref 38–126)
Anion gap: 9 (ref 5–15)
BUN: 15 mg/dL (ref 6–20)
CO2: 27 mmol/L (ref 22–32)
Calcium: 8.5 mg/dL — ABNORMAL LOW (ref 8.9–10.3)
Chloride: 97 mmol/L — ABNORMAL LOW (ref 98–111)
Creatinine, Ser: 0.76 mg/dL (ref 0.61–1.24)
GFR calc Af Amer: >60 mL/min (ref >60)
GFR calc non Af Amer: >60 mL/min (ref >60)
Glucose, Bld: 211 mg/dL — ABNORMAL HIGH (ref 70–99)
Potassium: 4.3 mmol/L (ref 3.5–5.1)
Sodium: 133 mmol/L — ABNORMAL LOW (ref 135–145)
Total Bilirubin: 0.6 mg/dL (ref 0.3–1.2)
Total Protein: 5.2 g/dL — ABNORMAL LOW (ref 6.5–8.1)

## 2020-08-17 LAB — D-DIMER, QUANTITATIVE: D-Dimer, Quant: 0.45 ug/mL-FEU (ref 0.00–0.50)

## 2020-08-17 LAB — FERRITIN: Ferritin: 280 ng/mL (ref 24–336)

## 2020-08-17 LAB — CBC WITH DIFFERENTIAL/PLATELET
Abs Immature Granulocytes: 0.19 10*3/uL — ABNORMAL HIGH (ref 0.00–0.07)
Basophils Absolute: 0 10*3/uL (ref 0.0–0.1)
Basophils Relative: 0 %
Eosinophils Absolute: 0 10*3/uL (ref 0.0–0.5)
Eosinophils Relative: 0 %
HCT: 37.7 % — ABNORMAL LOW (ref 39.0–52.0)
Hemoglobin: 12.9 g/dL — ABNORMAL LOW (ref 13.0–17.0)
Immature Granulocytes: 2 %
Lymphocytes Relative: 6 %
Lymphs Abs: 0.7 10*3/uL (ref 0.7–4.0)
MCH: 33 pg (ref 26.0–34.0)
MCHC: 34.2 g/dL (ref 30.0–36.0)
MCV: 96.4 fL (ref 80.0–100.0)
Monocytes Absolute: 0.7 10*3/uL (ref 0.1–1.0)
Monocytes Relative: 6 %
Neutro Abs: 10.3 10*3/uL — ABNORMAL HIGH (ref 1.7–7.7)
Neutrophils Relative %: 86 %
Platelets: 311 10*3/uL (ref 150–400)
RBC: 3.91 MIL/uL — ABNORMAL LOW (ref 4.22–5.81)
RDW: 11.8 % (ref 11.5–15.5)
WBC: 11.9 10*3/uL — ABNORMAL HIGH (ref 4.0–10.5)
nRBC: 0 % (ref 0.0–0.2)

## 2020-08-17 LAB — PHOSPHORUS: Phosphorus: 3.5 mg/dL (ref 2.5–4.6)

## 2020-08-17 LAB — C-REACTIVE PROTEIN: CRP: 1.5 mg/dL — ABNORMAL HIGH (ref <1.0)

## 2020-08-17 NOTE — Progress Notes (Signed)
CSW follows patient through Outpatient paramedicine program through [facility].  Called pt to check in while in the hospital.  Pt currently behind on rent and CSW discussed with pt current landlord who is hopeful to get pt assistance through the [employer] program.  CSW will attempt to assist with this application while pt is inpatient as he does not have a phone or access to a computer at home.  Will continue to follow and assist as needed  Burna Sis, LCSW Clinical Social Worker [facility] Desk#: [phone] Cell#: [phone]

## 2020-08-17 NOTE — TOC Initial Note (Addendum)
Transition of Care [facility]) - Initial/Assessment Note    Patient Details  Name: [name] MRN: [id number] Date of Birth: 14-Jul-1961  [facility]) CM/SW Contact:    Lawerance Sabal, RN Phone Number: 08/17/2020, 10:53 AM  Clinical Narrative:      Sherron Monday w patient over the phone. Patient will need transportation home at DC. Verified address on file. Verified patient has key/ access to enter.  Patient confirms he has no other ride home, explained to patient that he may experience an extended delay waiting for PTAR.  TOC will continue to follow.             Expected Discharge Plan: Home/Self Care Barriers to Discharge: Continued Medical Work up   Patient Goals and CMS Choice        Expected Discharge Plan and Services Expected Discharge Plan: Home/Self Care       Living arrangements for the past 2 months: Single Family Home                                      Prior Living Arrangements/Services Living arrangements for the past 2 months: Single Family Home   Patient language and need for interpreter reviewed:: Yes Do you feel safe going back to the place where you live?: Yes      Need for Family Participation in Patient Care: Yes (Comment) Care giver support system in place?: Yes (comment)   Criminal Activity/Legal Involvement Pertinent to Current Situation/Hospitalization: No - Comment as needed  Activities of Daily Living Home Assistive Devices/Equipment: Cane (specify quad or straight), Walker (specify type) ADL Screening (condition at time of admission) Patient's cognitive ability adequate to safely complete daily activities?: Yes Is the patient deaf or have difficulty hearing?: No Does the patient have difficulty seeing, even when wearing glasses/contacts?: No Does the patient have difficulty concentrating, remembering, or making decisions?: No Patient able to express need for assistance with ADLs?: Yes Does the patient have difficulty  dressing or bathing?: No Independently performs ADLs?: Yes (appropriate for developmental age) Does the patient have difficulty walking or climbing stairs?: Yes Weakness of Legs: Both Weakness of Arms/Hands: None  Permission Sought/Granted Permission sought to share information with : Case Manager                Emotional Assessment       Orientation: : Oriented to Self, Oriented to Place, Oriented to  Time, Oriented to Situation Alcohol / Substance Use: Not Applicable Psych Involvement: No (comment)  Admission diagnosis:  Fluid collection at surgical site [T88.8XXA] Pneumonia due to COVID-19 virus [U07.1, J12.82] Patient Active Problem List   Diagnosis Date Noted  . Pneumonia due to COVID-19 virus 08/14/2020  . Hyponatremia 08/14/2020  . Elevated AST (SGOT) 08/14/2020  . Prolonged QT interval 08/14/2020  . Cardiac arrest, cause unspecified (HCC) 07/06/2020  . Left arm cellulitis 05/19/2020  . Essential hypertension 05/18/2020  . Mixed hyperlipidemia 05/18/2020  . Alcohol abuse 05/18/2020  . Cellulitis of left upper extremity 05/18/2020  . Lactic acidosis 05/18/2020  . 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 ICD (implantable cardioverter-defibrillator) in place 08/29/2019  . Chronic systolic heart failure (HCC) 08/25/2019  . 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 Coronary artery disease involving native coronary artery of native heart without angina pectoris 12/07/2017   PCP:  Claiborne Rigg, NP  Pharmacy:   [facility] - [place], [place] - [address] [facility] [address] [place] [address] Phone: [phone] Fax: [phone]  [facility] - [place], [place] - [address] [place] [address] Phone: [phone] Fax: [phone]  [facility] - [place], [place] - [address]  [place] [address] [place] [address] Phone: [phone] Fax: [phone]     Social Determinants of Health (SDOH) Interventions    Readmission Risk Interventions No flowsheet data found.

## 2020-08-17 NOTE — Progress Notes (Signed)
PROGRESS NOTE  [name] TKW:[id number] DOB: 1961/06/20 DOA: 08/13/2020 PCP: Claiborne Rigg, NP   LOS: 3 days   Brief Narrative / Interim history: 59 year old male with history of CAD status post CABG, ICM s/p ICD, V. fib arrest, systolic CHF, HTN, HLD, seizures, history of alcohol abuse came in with cough and shortness of breath.  He tested positive on 8/14 for COVID-19 when he was in jail, got released, felt more short of breath and dyspneic at home and decided to come to the hospital.  Subjective / 24h Interval events: No complaints.  Denies shortness of breath.  No chest pain  Assessment & Plan:  Principal Problem Acute Hypoxic Respiratory Failure due to Covid-19 Viral Illness -Minimally hypoxic on admission requiring 2 L nasal cannula.  Imaging with CT chest on admission showed groundglass opacities throughout all lobes consistent with COVID-19 pneumonia.  He is on room air this morning -Patient was started on remdesivir, today is day 4, scheduled to be done on Wednesday 8/25 -patient was started on steroids, continue -He is a candidate for home discharge with outpatient Remdesivir however he has poor social situation.  I discussed with his social worker from cardiology [name], due to Covid diagnosis she is unable to arrange transportation from patient's home to the outpatient remdesivir infusion center, therefore patient will remain hospitalized until he finishes his 5 days of remdesivir and discharged home on Wednesday.  COVID-19 Labs  Recent Labs    08/15/20 0500 08/16/20 0421 08/17/20 0710  DDIMER 0.87* 0.58* 0.45  FERRITIN 529* 358* 280  CRP 7.4* 3.1* 1.5*    Lab Results  Component Value Date   SARSCOV2NAA POSITIVE (A) 08/14/2020   SARSCOV2NAA NEGATIVE 05/18/2020   SARSCOV2NAA NEGATIVE 01/08/2020   SARSCOV2NAA NEGATIVE 10/21/2019   Active Problems Combined systolic and diastolic CHF with history of ischemic cardiomyopathy, AICD in place -patient  appears euvolemic on exam.  Keep on the dry side.  CT scan of the chest showed a possible fluid collection that may displace the device anteriorly.  This was followed by ultrasound which did not confirm a fluid collection and may be patient positioning/muscle asymmetry.  Clinically there is no evidence of infection.   Alcohol abuse-patient with history of alcohol abuse, last time he was hospitalized was on my service as well and during withdrawal phase he became confused, combative, requiring Ativan around-the-clock as well as restraints and sitter.  Currently does not appear to be triggering the CIWA.  Hyponatremia-sodium stable today  Elevated LFTs-due to alcohol, now normalized  Prolonged QT-monitor  Seizure disorder -Continue Keppra   Scheduled Meds: . vitamin C  500 mg Oral Daily  . atorvastatin  80 mg Oral q1800  . carvedilol  3.125 mg Oral BID WC  . enoxaparin (LOVENOX) injection  40 mg Subcutaneous Q24H  . feeding supplement (ENSURE ENLIVE)  237 mL Oral BID BM  . folic acid  1 mg Oral Daily  . levETIRAcetam  500 mg Oral BID  . LORazepam  0-4 mg Intravenous Q12H  . methylPREDNISolone (SOLU-MEDROL) injection  0.5 mg/kg Intravenous Q12H  . multivitamin with minerals  1 tablet Oral Daily  . neomycin-bacitracin-polymyxin   Topical Daily  . pneumococcal 23 valent vaccine  0.5 mL Intramuscular Tomorrow-1000  . sacubitril-valsartan  1 tablet Oral BID  . sodium chloride flush  3 mL Intravenous Q12H  . thiamine  100 mg Oral Daily   Or  . thiamine  100 mg Intravenous Daily  . zinc sulfate  220  mg Oral Daily   Continuous Infusions: . remdesivir 100 mg in NS 100 mL 100 mg (08/17/20 0928)   PRN Meds:.acetaminophen, albuterol, alum & mag hydroxide-simeth, chlorpheniramine-HYDROcodone, guaiFENesin-dextromethorphan, LORazepam **OR** LORazepam  DVT prophylaxis: Lovenox Code Status: Full code Family Communication: no family at bedside   Status is: Inpatient  Remains inpatient  appropriate because:Unsafe d/c plan and Inpatient level of care appropriate due to severity of illness   Dispo: The patient is from: Home              Anticipated d/c is to: Home              Anticipated d/c date is: 1 day              Patient currently is not medically stable to d/c.  Consultants:  None   Procedures:  None   Microbiology: Blood cultures 8/21 - NGTD  Antibacterials: None    Objective: Vitals:   08/16/20 1528 08/16/20 2000 08/16/20 2117 08/17/20 0400  BP: 119/72 108/69 93/67 96/64   Pulse: 68 60 61 60  Resp:  20  16  Temp:  (!) 97.5 F (36.4 C)  98.2 F (36.8 C)  TempSrc:  Oral  Oral  SpO2:  96%  93%  Weight:      Height:        Intake/Output Summary (Last 24 hours) at 08/17/2020 0933 Last data filed at 08/17/2020 0540 Gross per 24 hour  Intake 483 ml  Output 1875 ml  Net -1392 ml   Filed Weights   08/14/20 0554  Weight: 62.6 kg    Examination:  Constitutional: NAD Eyes: No icterus ENMT: Moist mucous membranes Neck: normal, supple Respiratory: Clear bilaterally without wheezing or crackles, normal respiratory effort Cardiovascular: Regular rate and rhythm, no murmurs, no edema Abdomen: Soft, nontender, nondistended, bowel sounds positive Musculoskeletal: no clubbing / cyanosis.  Skin: No rashes.  ICD site clean, no erythema, no tenderness on palpation Neurologic: No focal deficits   Data Reviewed: I have independently reviewed following labs and imaging studies   CBC: Recent Labs  Lab 08/13/20 1856 08/15/20 0500 08/16/20 0421 08/17/20 0710  WBC 6.6 4.9 7.3 11.9*  NEUTROABS 4.8 3.2 6.2 10.3*  HGB 14.5 12.7* 11.6* 12.9*  HCT 43.8 38.0* 34.5* 37.7*  MCV 97.3 98.4 96.4 96.4  PLT 186 183 211 311   Basic Metabolic Panel: Recent Labs  Lab 08/13/20 1856 08/14/20 1018 08/15/20 0500 08/16/20 0421 08/17/20 0710  NA 130* 130* 133* 133* 133*  K 4.5 4.1 4.2 4.3 4.3  CL 93* 96* 99 96* 97*  CO2 25 24 24 28 27   GLUCOSE 115* 154*  151* 162* 211*  BUN 13 11 13 16 15   CREATININE 1.00 0.84 0.69 0.88 0.76  CALCIUM 8.9 8.2* 8.6* 8.9 8.5*  MG  --  1.7 1.8 1.7 1.8  PHOS  --   --  2.9 3.2 3.5   GFR: Estimated Creatinine Clearance: 90.2 mL/min (by C-G formula based on SCr of 0.76 mg/dL). Liver Function Tests: Recent Labs  Lab 08/13/20 1856 08/14/20 1018 08/15/20 0500 08/16/20 0421 08/17/20 0710  AST 71* 47* 39 24 20  ALT 39 32 31 24 22   ALKPHOS 52 39 39 36* 39  BILITOT 0.6 0.6 0.5 0.6 0.6  PROT 7.2 6.2* 6.1* 5.5* 5.2*  ALBUMIN 3.0* 2.5* 2.4* 2.1* 2.1*   No results for input(s): LIPASE, AMYLASE in the last 168 hours. No results for input(s): AMMONIA in the last 168 hours. Coagulation Profile: No  results for input(s): INR, PROTIME in the last 168 hours. Cardiac Enzymes: No results for input(s): CKTOTAL, CKMB, CKMBINDEX, TROPONINI in the last 168 hours. BNP (last 3 results) No results for input(s): PROBNP in the last 8760 hours. HbA1C: No results for input(s): HGBA1C in the last 72 hours. CBG: No results for input(s): GLUCAP in the last 168 hours. Lipid Profile: No results for input(s): CHOL, HDL, LDLCALC, TRIG, CHOLHDL, LDLDIRECT in the last 72 hours. Thyroid Function Tests: No results for input(s): TSH, T4TOTAL, FREET4, T3FREE, THYROIDAB in the last 72 hours. Anemia Panel: Recent Labs    08/16/20 0421 08/17/20 0710  FERRITIN 358* 280   Urine analysis:    Component Value Date/Time   COLORURINE COLORLESS (A) 01/08/2020 1922   APPEARANCEUR CLEAR 01/08/2020 1922   LABSPEC 1.002 (L) 01/08/2020 1922   PHURINE 7.0 01/08/2020 1922   GLUCOSEU NEGATIVE 01/08/2020 1922   HGBUR NEGATIVE 01/08/2020 1922   BILIRUBINUR NEGATIVE 01/08/2020 1922   KETONESUR NEGATIVE 01/08/2020 1922   PROTEINUR NEGATIVE 01/08/2020 1922   NITRITE NEGATIVE 01/08/2020 1922   LEUKOCYTESUR NEGATIVE 01/08/2020 1922   Sepsis Labs: Invalid input(s): PROCALCITONIN, LACTICIDVEN  Recent Results (from the past 240 hour(s))  Blood  Culture (routine x 2)     Status: None (Preliminary result)   Collection Time: 08/14/20  5:40 AM   Specimen: BLOOD  Result Value Ref Range Status   Specimen Description BLOOD LEFT ANTECUBITAL  Final   Special Requests   Final    BOTTLES DRAWN AEROBIC AND ANAEROBIC Blood Culture adequate volume   Culture   Final    NO GROWTH 3 DAYS Performed at [facility], [address] [place], [place] [address]    Report Status PENDING  Incomplete  Blood Culture (routine x 2)     Status: None (Preliminary result)   Collection Time: 08/14/20  5:45 AM   Specimen: BLOOD  Result Value Ref Range Status   Specimen Description BLOOD RIGHT ANTECUBITAL  Final   Special Requests   Final    BOTTLES DRAWN AEROBIC AND ANAEROBIC Blood Culture adequate volume   Culture   Final    NO GROWTH 3 DAYS Performed at [facility], [address] [place], [place] [address]    Report Status PENDING  Incomplete  SARS Coronavirus 2 by RT PCR (hospital order, performed in [facility] lab) Nasopharyngeal Nasopharyngeal Swab     Status: Abnormal   Collection Time: 08/14/20  6:00 AM   Specimen: Nasopharyngeal Swab  Result Value Ref Range Status   SARS Coronavirus 2 POSITIVE (A) NEGATIVE Final    Comment: RESULT CALLED TO, READ BACK BY AND VERIFIED WITH: RN E. [id number] FCP (NOTE) SARS-CoV-2 target nucleic acids are DETECTED  SARS-CoV-2 RNA is generally detectable in upper respiratory specimens  during the acute phase of infection.  Positive results are indicative  of the presence of the identified virus, but do not rule out bacterial infection or co-infection with other pathogens not detected by the test.  Clinical correlation with patient history and  other diagnostic information is necessary to determine patient infection status.  The expected result is negative.  Fact Sheet for Patients:   BoilerBrush.com.cyhttps://www.fda.gov/media/136312/download   Fact Sheet for Healthcare Providers:     https://pope.com/https://www.fda.gov/media/136313/download    This test is not yet approved or cleared by the [place] FDA and  has been authorized for detection and/or diagnosis of SARS-CoV-2 by FDA under an Emergency Use Authorization (EUA).  This EUA will  remain in effect (meaning this test can b e used) for the duration of  the COVID-19 declaration under Section 564(b)(1) of the Act, 21 U.S.C. section 360-bbb-3(b)(1), unless the authorization is terminated or revoked sooner.  Performed at [facility], [address], [place], [place] [address]       Radiology Studies: [place] CHEST SOFT TISSUE  Result Date: 08/15/2020 CLINICAL DATA:  Evaluate for abscess beneath pacemaker. EXAM: ULTRASOUND OF HEAD/NECK SOFT TISSUES TECHNIQUE: Ultrasound examination of the head and neck soft tissues was performed in the area of clinical concern. COMPARISON:  CT pulmonary angiogram August 14, 2020 FINDINGS: Due to the pacemaker, it is difficult to see directly behind the pacemaker. However, the musculature immediately adjacent to the pacemaker is normal in appearance. IMPRESSION: It is difficult to see immediately behind the pacemaker on ultrasound or CT imaging. However, the musculature and soft tissue surrounding the pacemaker are completely normal on today's study. I suspect the prominence on the CT pulmonary angiogram is due to asymmetric muscular thickening due to the overlying pacemaker and patient positioning. Electronically Signed   By: Gerome Sam III M.D   On: 08/15/2020 12:15    Pamella Pert, MD, PhD [facility]  Between 7 am - 7 pm I am available, please contact me via Amion or Securechat  Between 7 pm - 7 am I am not available, please contact night coverage MD/APP via Amion

## 2020-08-18 ENCOUNTER — Telehealth (HOSPITAL_COMMUNITY): Payer: Self-pay | Admitting: Licensed Clinical Social Worker

## 2020-08-18 DIAGNOSIS — T888XXS Other specified complications of surgical and medical care, not elsewhere classified, sequela: Secondary | ICD-10-CM

## 2020-08-18 LAB — CBC WITH DIFFERENTIAL/PLATELET
Abs Immature Granulocytes: 0.5 10*3/uL — ABNORMAL HIGH (ref 0.00–0.07)
Basophils Absolute: 0.1 10*3/uL (ref 0.0–0.1)
Basophils Relative: 1 %
Eosinophils Absolute: 0 10*3/uL (ref 0.0–0.5)
Eosinophils Relative: 0 %
HCT: 40.4 % (ref 39.0–52.0)
Hemoglobin: 13.7 g/dL (ref 13.0–17.0)
Immature Granulocytes: 5 %
Lymphocytes Relative: 8 %
Lymphs Abs: 0.9 10*3/uL (ref 0.7–4.0)
MCH: 33.4 pg (ref 26.0–34.0)
MCHC: 33.9 g/dL (ref 30.0–36.0)
MCV: 98.5 fL (ref 80.0–100.0)
Monocytes Absolute: 0.7 10*3/uL (ref 0.1–1.0)
Monocytes Relative: 7 %
Neutro Abs: 9 10*3/uL — ABNORMAL HIGH (ref 1.7–7.7)
Neutrophils Relative %: 79 %
Platelets: 368 10*3/uL (ref 150–400)
RBC: 4.1 MIL/uL — ABNORMAL LOW (ref 4.22–5.81)
RDW: 11.8 % (ref 11.5–15.5)
WBC: 11.2 10*3/uL — ABNORMAL HIGH (ref 4.0–10.5)
nRBC: 0 % (ref 0.0–0.2)

## 2020-08-18 LAB — MAGNESIUM: Magnesium: 1.9 mg/dL (ref 1.7–2.4)

## 2020-08-18 LAB — COMPREHENSIVE METABOLIC PANEL
ALT: 22 U/L (ref 0–44)
AST: 21 U/L (ref 15–41)
Albumin: 2.2 g/dL — ABNORMAL LOW (ref 3.5–5.0)
Alkaline Phosphatase: 42 U/L (ref 38–126)
Anion gap: 8 (ref 5–15)
BUN: 19 mg/dL (ref 6–20)
CO2: 28 mmol/L (ref 22–32)
Calcium: 8.4 mg/dL — ABNORMAL LOW (ref 8.9–10.3)
Chloride: 96 mmol/L — ABNORMAL LOW (ref 98–111)
Creatinine, Ser: 0.76 mg/dL (ref 0.61–1.24)
GFR calc Af Amer: >60 mL/min (ref >60)
GFR calc non Af Amer: >60 mL/min (ref >60)
Glucose, Bld: 218 mg/dL — ABNORMAL HIGH (ref 70–99)
Potassium: 4.6 mmol/L (ref 3.5–5.1)
Sodium: 132 mmol/L — ABNORMAL LOW (ref 135–145)
Total Bilirubin: 0.5 mg/dL (ref 0.3–1.2)
Total Protein: 5.2 g/dL — ABNORMAL LOW (ref 6.5–8.1)

## 2020-08-18 LAB — C-REACTIVE PROTEIN: CRP: 1.5 mg/dL — ABNORMAL HIGH (ref <1.0)

## 2020-08-18 LAB — FERRITIN: Ferritin: 217 ng/mL (ref 24–336)

## 2020-08-18 LAB — D-DIMER, QUANTITATIVE: D-Dimer, Quant: 0.67 ug/mL-FEU — ABNORMAL HIGH (ref 0.00–0.50)

## 2020-08-18 MED ORDER — ALBUTEROL SULFATE HFA 108 (90 BASE) MCG/ACT IN AERS: 2.0000 | INHALATION_SPRAY | Freq: Four times a day (QID) | RESPIRATORY_TRACT | 0 refills | Status: DC | PRN

## 2020-08-18 MED ORDER — BENZONATATE 100 MG PO CAPS: 100.0000 mg | ORAL_CAPSULE | Freq: Four times a day (QID) | ORAL | 0 refills | Status: DC | PRN

## 2020-08-18 MED ORDER — PREDNISONE 10 MG PO TABS: ORAL_TABLET | ORAL | 0 refills | Status: DC

## 2020-08-18 MED FILL — PROAIR HFA 90 MCG INHALER: 108 (90 BAS | 16 days supply | Qty: 9 | Fill #0

## 2020-08-18 MED FILL — BENZONATATE 100 MG CAP: 100 | 7 days supply | Qty: 30 | Fill #0

## 2020-08-18 MED FILL — predniSONE 10 MG TABS: 10 | 4 days supply | Qty: 10 | Fill #0

## 2020-08-18 NOTE — Care Management (Signed)
Provided [facility] pharmacy with $15 petty cash to cover meds. PTAR forms on chart, nurse to call PTAR when ready.

## 2020-08-18 NOTE — Telephone Encounter (Signed)
CSW submitted application for [employer] project to help with current pastdue utility and rent- awaiting response.  Will continue to follow and assist as needed  Burna Sis, LCSW Clinical Social Worker [facility] Desk#: [phone] Cell#: [phone]

## 2020-08-18 NOTE — Progress Notes (Signed)
Pt discharged home via PTAR, all belongings and discharge papers with pt,  VSS at DC.

## 2020-08-18 NOTE — Discharge Summary (Signed)
PATIENT DETAILS Name: [name] Age: 59 y.o. Sex: male Date of Birth: 01-Nov-1961 MRN: [id number]. Admitting Physician: Clydie Braun, MD WUJ:[id number], Shea Stakes, NP  Admit Date: 08/13/2020 Discharge date: 08/18/2020  Recommendations for Outpatient Follow-up:  1. Follow up with PCP in 1-2 weeks 2. Please obtain CMP/CBC in one week 3. Repeat Chest Xray in 4-6 week  Admitted From:  Home  Disposition: Home   Home Health: No  Equipment/Devices: None  Discharge Condition: Stable  CODE STATUS: FULL CODE  Diet recommendation:  Diet Order            Diet - low sodium heart healthy           Diet Heart Room service appropriate? No; Fluid consistency: Thin; Fluid restriction: 1800 mL Fluid  Diet effective now                  Brief Summary: 59 year old male with history of CAD status post CABG, ICM s/p ICD, V. fib arrest, systolic CHF, HTN, HLD, seizures, history of alcohol abuse came in with cough and shortness of breath.  He tested positive on 8/14 for COVID-19 when he was in jail, got released, felt more short of breath and dyspneic at home and decided to come to the hospital.  He was found to have COVID-19 pneumonia with mild hypoxemia and admitted to the hospital service.  Brief Hospital Course: Acute Hypoxic Respiratory Failure secondary to COVID-19 pneumonia: Rapidly improved-on room air this morning-hardly has any symptoms-has completed a course of remdesivir.  Will go home on tapering steroids.  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  .  CT scan of the  chest showed a possible fluid collection that may displace the device anteriorly.  This was followed by ultrasound which did not confirm a fluid collection and may be patient positioning/muscle asymmetry. Clinically there is no evidence of infection.  Blood cultures remain negative.  Continue usual CHF medications on discharge.  Follow-up with the CHF clinic/cardiology.  Alcohol abuse-no signs of alcohol withdrawal during this hospital stay.    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)   Alcohol abuse   Hyponatremia   Elevated AST (SGOT)   Prolonged QT interval   Discharge Instructions:    Person Under Monitoring Name: [name]  Location: [address] [place] [address]   Infection Prevention Recommendations for Individuals Confirmed to have, or Being Evaluated for, 2019 Novel Coronavirus (COVID-19) Infection Who Receive Care at Home  Individuals who are confirmed to have, or are being evaluated for, COVID-19 should follow the prevention steps below until a healthcare provider or local or state health department says they can return to normal activities.  Stay home except to get medical care You should restrict activities outside your  home, except for getting medical care. Do not go to work, school, or public areas, and do not use public transportation or taxis.  Call ahead before visiting your doctor Before your medical appointment, call the healthcare provider and tell them that you have, or are being evaluated for, COVID-19 infection. This will help the healthcare provider's office  take steps to keep other people from getting infected. Ask your healthcare provider to call the local or state health department.  Monitor your symptoms Seek prompt medical attention if your illness is worsening (e.g., difficulty breathing). Before going to your medical appointment, call the healthcare provider and tell them that you have, or are being evaluated for, COVID-19 infection. Ask your healthcare provider to call the local or state health department.  Wear a facemask You should wear a facemask that covers your nose and mouth when you are in the same room with other people and when you visit a healthcare provider. People who live with or visit you should also wear a facemask while they are in the same room with you.  Separate yourself from other people in your home As much as possible, you should stay in a different room from other people in your home. Also, you should use a separate bathroom, if available.  Avoid sharing household items You should not share dishes, drinking glasses, cups, eating utensils, towels, bedding, or other items with other people in your home. After using these items, you should wash them thoroughly with soap and water.  Cover your coughs and sneezes Cover your mouth and nose with a tissue when you cough or sneeze, or you can cough or sneeze into your sleeve. Throw used tissues in a lined trash can, and immediately wash your hands with soap and water for at least 20 seconds or use an alcohol-based hand rub.  Wash your [employer] your hands often and thoroughly with soap and water for at least 20 seconds. You can use an alcohol-based hand sanitizer if soap and water are not available and if your hands are not visibly dirty. Avoid touching your eyes, nose, and mouth with unwashed hands.   Prevention Steps for Caregivers and Household Members of Individuals Confirmed to have, or Being Evaluated for, COVID-19 Infection Being Cared for in the Home  If  you live with, or provide care at home for, a person confirmed to have, or being evaluated for, COVID-19 infection please follow these guidelines to prevent infection:  Follow healthcare provider's instructions Make sure that you understand and can help the patient follow any healthcare provider instructions for all care.  Provide for the patient's basic needs You should help the patient with basic needs in the home and provide support for getting groceries, prescriptions, and other personal needs.  Monitor the patient's symptoms If they are getting sicker, call his or her medical provider and tell them that the patient has, or is being evaluated for, COVID-19 infection. This will help the healthcare provider's office take steps to keep other people from getting infected. Ask the healthcare provider to call the local or state health department.  Limit the number of people who have contact with the patient  If possible, have only one caregiver for the patient.  Other household members should stay in another home or place of residence. If this is not possible, they should stay  in another room, or be separated from the patient as much as possible. Use a separate bathroom, if available.  Restrict visitors who do not  have an essential need to be in the home.  Keep older adults, very young children, and other sick people away from the patient Keep older adults, very young children, and those who have compromised immune systems or chronic health conditions away from the patient. This includes people with chronic heart, lung, or kidney conditions, diabetes, and cancer.  Ensure good ventilation Make sure that shared spaces in the home have good air flow, such as from an air conditioner or an opened window, weather permitting.  Wash your hands often  Wash your hands often and thoroughly with soap and water for at least 20 seconds. You can use an alcohol based hand sanitizer if soap and water  are not available and if your hands are not visibly dirty.  Avoid touching your eyes, nose, and mouth with unwashed hands.  Use disposable paper towels to dry your hands. If not available, use dedicated cloth towels and replace them when they become wet.  Wear a facemask and gloves  Wear a disposable facemask at all times in the room and gloves when you touch or have contact with the patient's blood, body fluids, and/or secretions or excretions, such as sweat, saliva, sputum, nasal mucus, vomit, urine, or feces.  Ensure the mask fits over your nose and mouth tightly, and do not touch it during use.  Throw out disposable facemasks and gloves after using them. Do not reuse.  Wash your hands immediately after removing your facemask and gloves.  If your personal clothing becomes contaminated, carefully remove clothing and launder. Wash your hands after handling contaminated clothing.  Place all used disposable facemasks, gloves, and other waste in a lined container before disposing them with other household waste.  Remove gloves and wash your hands immediately after handling these items.  Do not share dishes, glasses, or other household items with the patient  Avoid sharing household items. You should not share dishes, drinking glasses, cups, eating utensils, towels, bedding, or other items with a patient who is confirmed to have, or being evaluated for, COVID-19 infection.  After the person uses these items, you should wash them thoroughly with soap and water.  Wash laundry thoroughly  Immediately remove and wash clothes or bedding that have blood, body fluids, and/or secretions or excretions, such as sweat, saliva, sputum, nasal mucus, vomit, urine, or feces, on them.  Wear gloves when handling laundry from the patient.  Read and follow directions on labels of laundry or clothing items and detergent. In general, wash and dry with the warmest temperatures recommended on the  label.  Clean all areas the individual has used often  Clean all touchable surfaces, such as counters, tabletops, doorknobs, bathroom fixtures, toilets, phones, keyboards, tablets, and bedside tables, every day. Also, clean any surfaces that may have blood, body fluids, and/or secretions or excretions on them.  Wear gloves when cleaning surfaces the patient has come in contact with.  Use a diluted bleach solution (e.g., dilute bleach with 1 part bleach and 10 parts water) or a household disinfectant with a label that says EPA-registered for coronaviruses. To make a bleach solution at home, add 1 tablespoon of bleach to 1 quart (4 cups) of water. For a larger supply, add  cup of bleach to 1 gallon (16 cups) of water.  Read labels of cleaning products and follow recommendations provided on product labels. Labels contain instructions for safe and effective use of the cleaning product including precautions you should take when applying the product, such as wearing gloves  or eye protection and making sure you have good ventilation during use of the product.  Remove gloves and wash hands immediately after cleaning.  Monitor yourself for signs and symptoms of illness Caregivers and household members are considered close contacts, should monitor their health, and will be asked to limit movement outside of the home to the extent possible. Follow the monitoring steps for close contacts listed on the symptom monitoring form.   ? If you have additional questions, contact your local health department or call the epidemiologist on call at [phone] (available 24/7). ? This guidance is subject to change. For the most up-to-date guidance from CDC, please refer to their website: [employer]    Activity:  As tolerated  Discharge Instructions    Call MD for:  difficulty breathing, headache or visual disturbances   Complete by: As  directed    Diet - low sodium heart healthy   Complete by: As directed    Discharge instructions   Complete by: As directed    Follow with Primary MD  Claiborne Rigg, NP in 1-2 weeks  3 weeks of isolation from 08/07/2020  Please get a complete blood count and chemistry panel checked by your Primary MD at your next visit, and again as instructed by your Primary MD.  Get Medicines reviewed and adjusted: Please take all your medications with you for your next visit with your Primary MD  Laboratory/radiological data: Please request your Primary MD to go over all hospital tests and procedure/radiological results at the follow up, please ask your Primary MD to get all Hospital records sent to his/her office.  In some cases, they will be blood work, cultures and biopsy results pending at the time of your discharge. Please request that your primary care M.D. follows up on these results.  Also Note the following: If you experience worsening of your admission symptoms, develop shortness of breath, life threatening emergency, suicidal or homicidal thoughts you must seek medical attention immediately by calling 911 or calling your MD immediately  if symptoms less severe.  You must read complete instructions/literature along with all the possible adverse reactions/side effects for all the Medicines you take and that have been prescribed to you. Take any new Medicines after you have completely understood and accpet all the possible adverse reactions/side effects.   Do not drive when taking Pain medications or sleeping medications (Benzodaizepines)  Do not take more than prescribed Pain, Sleep and Anxiety Medications. It is not advisable to combine anxiety,sleep and pain medications without talking with your primary care practitioner  Special Instructions: If you have smoked or chewed Tobacco  in the last 2 yrs please stop smoking, stop any regular Alcohol  and or any Recreational drug use.  Wear  Seat belts while driving.  Please note: You were cared for by a hospitalist during your hospital stay. Once you are discharged, your primary care physician will handle any further medical issues. Please note that NO REFILLS for any discharge medications will be authorized once you are discharged, as it is imperative that you return to your primary care physician (or establish a relationship with a primary care physician if you do not have one) for your post hospital discharge needs so that they can reassess your need for medications and monitor your lab values.   Discharge wound care:   Complete by: As directed    Please place bandage to left foot with Neosporin   Increase activity slowly   Complete by: As directed  Allergies as of 08/18/2020   No Known Allergies     Medication List    STOP taking these medications   aspirin EC 81 MG tablet   folic acid 1 MG tablet Commonly known as: FOLVITE   furosemide 40 MG tablet Commonly known as: LASIX     TAKE these medications   albuterol 108 (90 Base) MCG/ACT inhaler Commonly known as: VENTOLIN HFA Inhale 2 puffs into the lungs every 6 (six) hours as needed for wheezing or shortness of breath.   atorvastatin 80 MG tablet Commonly known as: LIPITOR Take 1 tablet (80 mg total) by mouth daily at 6 PM.   benzonatate 100 MG capsule Commonly known as: Tessalon Perles Take 1 capsule (100 mg total) by mouth every 6 (six) hours as needed for cough.   Blood Pressure Monitor Devi Please provide patient with insurance approved blood pressure monitor. I10.0   carvedilol 3.125 MG tablet Commonly known as: COREG Take 1 tablet (3.125 mg total) by mouth 2 (two) times daily with a meal. What changed: Another medication with the same name was removed. Continue taking this medication, and follow the directions you see here.   Entresto 24-26 MG Generic drug: sacubitril-valsartan Take 1 tablet by mouth 2 (two) times daily.   ezetimibe 10 MG  tablet Commonly known as: ZETIA Take 1 tablet (10 mg total) by mouth daily.   ivabradine 5 MG Tabs tablet Commonly known as: Corlanor Take 1 tablet (5 mg total) by mouth 2 (two) times daily with a meal. What changed: how much to take   levETIRAcetam 500 MG tablet Commonly known as: KEPPRA Take 1 tablet (500 mg total) by mouth 2 (two) times daily.   predniSONE 10 MG tablet Commonly known as: DELTASONE Take 40 mg daily for 1 day, 30 mg daily for 1 day, 20 mg daily for 1 days,10 mg daily for 1 day, then stop   spironolactone 25 MG tablet Commonly known as: ALDACTONE Take 1 tablet (25 mg total) by mouth daily.   thiamine 100 MG tablet Take 1 tablet (100 mg total) by mouth daily.            Discharge Care Instructions  (From admission, onward)         Start     Ordered   08/18/20 0000  Discharge wound care:       Comments: Please place bandage to left foot with Neosporin   08/18/20 1136          Follow-up Information    Claiborne Rigg, NP. Schedule an appointment as soon as possible for a visit in 1 week(s).   Specialty: Nurse Practitioner Contact information: [address] [place] [address] [phone]        Marinus Maw, MD Follow up in 1 month(s).   Specialty: Cardiology Contact information: [address] [place] [address] [phone]              No Known Allergies     Other Procedures/Studies: CT Angio Chest PE W and/or Wo Contrast  Result Date: 08/14/2020 CLINICAL DATA:  COVID positive patient. Worsening cough and shortness of breath. EXAM: CT ANGIOGRAPHY CHEST WITH CONTRAST TECHNIQUE: Multidetector CT imaging of the chest was performed using the standard protocol during bolus administration of intravenous contrast. Multiplanar CT image reconstructions and MIPs were obtained to evaluate the vascular anatomy. CONTRAST:  75mL OMNIPAQUE IOHEXOL 350 MG/ML SOLN COMPARISON:  Chest x-ray August 13, 2020.  Noncontrast chest CT  November 29, 2017. FINDINGS: Cardiovascular: Three-vessel coronary artery disease identified. The heart size remains borderline. The thoracic aorta is unremarkable. No pulmonary emboli. Mediastinum/Nodes: The thyroid and esophagus are normal. No effusions. 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 Prominence posterior to the pacemaker is identified. Bilateral gynecomastia, relatively mild. No adenopathy. Lungs/Pleura: Central airways are normal. No pneumothorax. Patchy peripheral ground-glass opacities involving all lobes but most prominent the bases is consistent with the patient's reported history COVID-19. More focal consolidation is seen in the right base on series 9, image 75. No suspicious nodules or masses. Upper Abdomen: No acute abnormality. Musculoskeletal: No chest wall abnormality. No acute or significant osseous findings. Review of the MIP images confirms the above findings. IMPRESSION: 1. No pulmonary emboli. 2. Patchy primarily ground-glass opacities involving all lobes but most prominent in the periphery of the bases is consistent with COVID-19 pneumonia given history. More dense consolidation is seen in the right base. 3. The left pacemaker appears to be displaced anteriorly by either a fluid collection which could represent a postoperative seroma given placement of the implant a year ago. Prominent musculature or an abscess are not excluded on limited imaging of this location. Streak artifact limits evaluation in this location. Recommend clinical correlation. 4. Three-vessel coronary artery disease. Electronically Signed   By: Gerome Samavid  Williams III M.D   On: 08/14/2020 08:11   DG Chest Portable 1 View  Result Date: 08/13/2020 CLINICAL DATA:  Cough, COVID-19 EXAM: PORTABLE CHEST 1 VIEW COMPARISON:  Radiograph 03/28/2020, CT 11/29/2017 FINDINGS: Chronic mild elevation of the left hemidiaphragm. There is mixed patchy airspace and interstitial opacity in the right lung base.  Additional dense bandlike opacity in the left lower lobe could reflect atelectasis though some surrounding hazy density could reflect superimposed infection as well. No pneumothorax or visible effusion. Prior sternotomy and features of CABG with stable cardiomediastinal contours including a calcified, tortuous aorta. Defibrillator pack overlies the left chest wall with single lead at the cardiac apex. Multitude of remote bilateral rib deformities. No acute osseous or soft tissue abnormality is seen. Degenerative changes are present in the imaged spine and shoulders. IMPRESSION: 1. Mixed patchy airspace and interstitial opacity in the right lung base, suspicious for pneumonia. 2. Dense bandlike opacity in the left lower lobe could reflect atelectasis though adjacent opacity may suggest developing airspace disease in this location as well. Electronically Signed   By: Kreg ShropshirePrice  DeHay M.D.   On: 08/13/2020 19:21   US CHEST SOFT TISSUE  Result Date: 08/15/2020 CLINICAL DATA:  Evaluate for abscess beneath pacemaker. EXAM: ULTRASOUND OF HEAD/NECK SOFT TISSUES TECHNIQUE: Ultrasound examination of the head and neck soft tissues was performed in the area of clinical concern. COMPARISON:  CT pulmonary angiogram August 14, 2020 FINDINGS: Due to the pacemaker, it is difficult to see directly behind the pacemaker. However, the musculature immediately adjacent to the pacemaker is normal in appearance. IMPRESSION: It is difficult to see immediately behind the pacemaker on ultrasound or CT imaging. However, the musculature and soft tissue surrounding the pacemaker are completely normal on today's study. I suspect the prominence on the CT pulmonary angiogram is due to asymmetric muscular thickening due to the overlying pacemaker and patient positioning. Electronically Signed   By: Gerome Samavid  Williams III M.D   On: 08/15/2020 12:15     TODAY-DAY OF DISCHARGE:  Subjective:   Jose SmallWesley [name] today has no headache,no chest abdominal  pain,no new weakness tingling or numbness, feels much better wants to go home today.  Objective:   Blood pressure 99/64, pulse 64, temperature 98.2 F (36.8 C), temperature source Oral, resp. rate 19, height 5\' 10"  (1.778 m), weight 62.6 kg, SpO2 94 %.  Intake/Output Summary (Last 24 hours) at 08/18/2020 1136 Last data filed at 08/18/2020 0800 Gross per 24 hour  Intake 363 ml  Output 750 ml  Net -387 ml   Filed Weights   08/14/20 0554  Weight: 62.6 kg    Exam: Awake Alert, Oriented *3, No new F.N deficits, Normal affect [place].AT,PERRAL Supple Neck,No JVD, No cervical lymphadenopathy appriciated.  Symmetrical Chest wall movement, Good air movement bilaterally, CTAB RRR,No Gallops,Rubs or new Murmurs, No Parasternal Heave +ve B.Sounds, Abd Soft, Non tender, No organomegaly appriciated, No rebound -guarding or rigidity. No Cyanosis, Clubbing or edema, No new Rash or bruise   PERTINENT RADIOLOGIC STUDIES: CT Angio Chest PE W and/or Wo Contrast  Result Date: 08/14/2020 CLINICAL DATA:  COVID positive patient. Worsening cough and shortness of breath. EXAM: CT ANGIOGRAPHY CHEST WITH CONTRAST TECHNIQUE: Multidetector CT imaging of the chest was performed using the standard protocol during bolus administration of intravenous contrast. Multiplanar CT image reconstructions and MIPs were obtained to evaluate the vascular anatomy. CONTRAST:  28mL OMNIPAQUE IOHEXOL 350 MG/ML SOLN COMPARISON:  Chest x-ray August 13, 2020. Noncontrast chest CT November 29, 2017. FINDINGS: Cardiovascular: Three-vessel coronary artery disease identified. The heart size remains borderline. The thoracic aorta is unremarkable. No pulmonary emboli. Mediastinum/Nodes: The thyroid and esophagus are normal. No effusions. 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 Prominence posterior to the pacemaker is identified. Bilateral gynecomastia, relatively mild. No adenopathy. Lungs/Pleura: Central airways are normal. No pneumothorax.  Patchy peripheral ground-glass opacities involving all lobes but most prominent the bases is consistent with the patient's reported history COVID-19. More focal consolidation is seen in the right base on series 9, image 75. No suspicious nodules or masses. Upper Abdomen: No acute abnormality. Musculoskeletal: No chest wall abnormality. No acute or significant osseous findings. Review of the MIP images confirms the above findings. IMPRESSION: 1. No pulmonary emboli. 2. Patchy primarily ground-glass opacities involving all lobes but most prominent in the periphery of the bases is consistent with COVID-19 pneumonia given history. More dense consolidation is seen in the right base. 3. The left pacemaker appears to be displaced anteriorly by either a fluid collection which could represent a postoperative seroma given placement of the implant a year ago. Prominent musculature or an abscess are not excluded on limited imaging of this location. Streak artifact limits evaluation in this location. Recommend clinical correlation. 4. Three-vessel coronary artery disease. Electronically Signed   By: December 01, 2017 III M.D   On: 08/14/2020 08:11   DG Chest Portable 1 View  Result Date: 08/13/2020 CLINICAL DATA:  Cough, COVID-19 EXAM: PORTABLE CHEST 1 VIEW COMPARISON:  Radiograph 03/28/2020, CT 11/29/2017 FINDINGS: Chronic mild elevation of the left hemidiaphragm. There is mixed patchy airspace and interstitial opacity in the right lung base. Additional dense bandlike opacity in the left lower lobe could reflect atelectasis though some surrounding hazy density could reflect superimposed infection as well. No pneumothorax or visible effusion. Prior sternotomy and features of CABG with stable cardiomediastinal contours including a calcified, tortuous aorta. Defibrillator pack overlies the left chest wall with single lead at the cardiac apex. Multitude of remote bilateral rib deformities. No acute osseous or soft tissue  abnormality is seen. Degenerative changes are present in the imaged spine and shoulders. IMPRESSION: 1. Mixed patchy airspace and interstitial opacity in the right lung  base, suspicious for pneumonia. 2. Dense bandlike opacity in the left lower lobe could reflect atelectasis though adjacent opacity may suggest developing airspace disease in this location as well. Electronically Signed   By: Kreg Shropshire M.D.   On: 08/13/2020 19:21   [place] CHEST SOFT TISSUE  Result Date: 08/15/2020 CLINICAL DATA:  Evaluate for abscess beneath pacemaker. EXAM: ULTRASOUND OF HEAD/NECK SOFT TISSUES TECHNIQUE: Ultrasound examination of the head and neck soft tissues was performed in the area of clinical concern. COMPARISON:  CT pulmonary angiogram August 14, 2020 FINDINGS: Due to the pacemaker, it is difficult to see directly behind the pacemaker. However, the musculature immediately adjacent to the pacemaker is normal in appearance. IMPRESSION: It is difficult to see immediately behind the pacemaker on ultrasound or CT imaging. However, the musculature and soft tissue surrounding the pacemaker are completely normal on today's study. I suspect the prominence on the CT pulmonary angiogram is due to asymmetric muscular thickening due to the overlying pacemaker and patient positioning. Electronically Signed   By: Gerome Sam III M.D   On: 08/15/2020 12:15     PERTINENT LAB RESULTS: CBC: Recent Labs    08/17/20 0710 08/18/20 0813  WBC 11.9* 11.2*  HGB 12.9* 13.7  HCT 37.7* 40.4  PLT 311 368   CMET CMP     Component Value Date/Time   NA 132 (L) 08/18/2020 0813   NA 135 08/25/2019 1104   K 4.6 08/18/2020 0813   CL 96 (L) 08/18/2020 0813   CO2 28 08/18/2020 0813   GLUCOSE 218 (H) 08/18/2020 0813   BUN 19 08/18/2020 0813   BUN 14 08/25/2019 1104   CREATININE 0.76 08/18/2020 0813   CALCIUM 8.4 (L) 08/18/2020 0813   PROT 5.2 (L) 08/18/2020 0813   ALBUMIN 2.2 (L) 08/18/2020 0813   AST 21 08/18/2020 0813   ALT 22  08/18/2020 0813   ALKPHOS 42 08/18/2020 0813   BILITOT 0.5 08/18/2020 0813   GFRNONAA >60 08/18/2020 0813   GFRAA >60 08/18/2020 0813    GFR Estimated Creatinine Clearance: 90.2 mL/min (by C-G formula based on SCr of 0.76 mg/dL). No results for input(s): LIPASE, AMYLASE in the last 72 hours. No results for input(s): CKTOTAL, CKMB, CKMBINDEX, TROPONINI in the last 72 hours. Invalid input(s): POCBNP Recent Labs    08/17/20 0710 08/18/20 0813  DDIMER 0.45 0.67*   No results for input(s): HGBA1C in the last 72 hours. No results for input(s): CHOL, HDL, LDLCALC, TRIG, CHOLHDL, LDLDIRECT in the last 72 hours. No results for input(s): TSH, T4TOTAL, T3FREE, THYROIDAB in the last 72 hours.  Invalid input(s): FREET3 Recent Labs    08/17/20 0710 08/18/20 0813  FERRITIN 280 217   Coags: No results for input(s): INR in the last 72 hours.  Invalid input(s): PT Microbiology: Recent Results (from the past 240 hour(s))  Blood Culture (routine x 2)     Status: None (Preliminary result)   Collection Time: 08/14/20  5:40 AM   Specimen: BLOOD  Result Value Ref Range Status   Specimen Description BLOOD LEFT ANTECUBITAL  Final   Special Requests   Final    BOTTLES DRAWN AEROBIC AND ANAEROBIC Blood Culture adequate volume   Culture   Final    NO GROWTH 4 DAYS Performed at [facility], [address] [place], [place] [address]    Report Status PENDING  Incomplete  Blood Culture (routine x 2)     Status: None (Preliminary result)   Collection Time: 08/14/20  5:45 AM   Specimen: BLOOD  Result Value Ref Range Status   Specimen Description BLOOD RIGHT ANTECUBITAL  Final   Special Requests   Final    BOTTLES DRAWN AEROBIC AND ANAEROBIC Blood Culture adequate volume   Culture   Final    NO GROWTH 4 DAYS Performed at [facility], [address] [place], [place] [address]    Report Status PENDING  Incomplete  SARS Coronavirus 2 by RT PCR (hospital order, performed in [facility] hospital lab) Nasopharyngeal Nasopharyngeal Swab     Status: Abnormal   Collection Time: 08/14/20  6:00 AM   Specimen: Nasopharyngeal Swab  Result Value Ref Range Status   SARS Coronavirus 2 POSITIVE (A) NEGATIVE Final    Comment: RESULT CALLED TO, READ BACK BY AND VERIFIED WITH: RN E. [id number] FCP (NOTE) SARS-CoV-2 target nucleic acids are DETECTED  SARS-CoV-2 RNA is generally detectable in upper respiratory specimens  during the acute phase of infection.  Positive results are indicative  of the presence of the identified virus, but do not rule out bacterial infection or co-infection with other pathogens not detected by the test.  Clinical correlation with patient history and  other diagnostic information is necessary to determine patient infection status.  The expected result is negative.  Fact Sheet for Patients:   BoilerBrush.com.cy   Fact Sheet for Healthcare Providers:   https://pope.com/    This test is not yet approved or cleared by the [place] FDA and  has been authorized for detection and/or diagnosis of SARS-CoV-2 by FDA under an Emergency Use Authorization (EUA).  This EUA will remain in effect (meaning this test can b e used) for the duration of  the COVID-19 declaration under Section 564(b)(1) of the Act, 21 U.S.C. section 360-bbb-3(b)(1), unless the authorization is terminated or revoked sooner.  Performed at [facility], [address] [place], [place] [address]     FURTHER DISCHARGE INSTRUCTIONS:  Get Medicines reviewed and adjusted: Please take all your medications with you for your next visit with your Primary MD  Laboratory/radiological data: Please request your Primary MD to go over all hospital tests and procedure/radiological results at the follow up, please ask your Primary MD to get all Hospital records sent to his/her office.  In some cases, they will be blood work,  cultures and biopsy results pending at the time of your discharge. Please request that your primary care M.D. goes through all the records of your hospital data and follows up on these results.  Also Note the following: If you experience worsening of your admission symptoms, develop shortness of breath, life threatening emergency, suicidal or homicidal thoughts you must seek medical attention immediately by calling 911 or calling your MD immediately  if symptoms less severe.  You must read complete instructions/literature along with all the possible adverse reactions/side effects for all the Medicines you take and that have been prescribed to you. Take any new Medicines after you have completely understood and accpet all the possible adverse reactions/side effects.   Do not drive when taking Pain medications or sleeping medications (Benzodaizepines)  Do not take more than prescribed Pain, Sleep and Anxiety Medications. It is not advisable to combine anxiety,sleep and pain medications without talking with your primary care practitioner  Special Instructions: If you have smoked or chewed Tobacco  in the last 2 yrs please stop smoking, stop any regular Alcohol  and or any Recreational drug use.  Wear Seat belts while  driving.  Please note: You were cared for by a hospitalist during your hospital stay. Once you are discharged, your primary care physician will handle any further medical issues. Please note that NO REFILLS for any discharge medications will be authorized once you are discharged, as it is imperative that you return to your primary care physician (or establish a relationship with a primary care physician if you do not have one) for your post hospital discharge needs so that they can reassess your need for medications and monitor your lab values.  Total Time spent coordinating discharge including counseling, education and face to face time equals 35 minutes.  SignedJeoffrey Massed 08/18/2020 11:36 AM

## 2020-08-19 ENCOUNTER — Telehealth: Payer: Self-pay

## 2020-08-19 LAB — CULTURE, BLOOD (ROUTINE X 2)
Culture: NO GROWTH
Culture: NO GROWTH
Special Requests: ADEQUATE
Special Requests: ADEQUATE

## 2020-08-19 NOTE — Telephone Encounter (Signed)
Transition Care Management Follow-up Telephone Call Date of discharge and from where: 08/14/2020, [facility] .  Patient does not have a phone.  His next appointment with PCP is not until 10/05/2020.  This CM spoke to [name], LCSW who stated that [name], EMT will be seeing patient next week 08/24/2020 and will assess his needs.  She can contact this CM with any questions/concerns and/or to schedule a hospital follow up  appointment with PCP

## 2020-08-23 ENCOUNTER — Telehealth (HOSPITAL_COMMUNITY): Payer: Self-pay | Admitting: Licensed Clinical Social Worker

## 2020-08-23 NOTE — Progress Notes (Signed)
**Patient did not show. Note left for templating purposes only. **     ADVANCED HF CLINIC NOTE  PCP: None  Primary Cardiologist: Dr Gala Romney   HPI: [name] is a 59 y.o. male with h/o ETOH, VF Arrest, CAD s/p CABG 12/07/17, and ICM with EF 25-30%.   Admitted 12/18 with VF arrest while chopping wood. Received CPR in the field. R/LHC with severe 3vD as below and cardiogenic shock requiring IABP. Required milrinone with continued shock. Pt improved and IABP removed 12/10, but pt had recurrent VF arrest so IABP replaced. Stabilized and underwent CABG 12/07/17. Tolerated gradual wean of meds and extubation with no furthe events. HF medications titrated as tolerated.  Echo 04/2019  LVEF at 25%.    9/20 implantation of a [employer] single chamber ICD.   He had routine clinic f/u on 10/23/19 and while in exam room had a witnessed seizure  with loss of pulse. CPR started with quick ROSC. He did not require shock. Taken to the ED and had another seizure. Neurology consulted. Started on Keppra. HS Trop 10>12.  ICD interrogated. No arrhythmias noted. Admitted to [facility] on 12/09/19 with recurrent seizures in setting of noncompliance with his Keppra.   Admitted 01/08/20 after syncopal episode. Had AKI so HF meds stopped. Given IV fluids and started back on coreg, spiro, and lasix. [name] was stopped. Had return f/u 2/21 and AKI had resolved. Entresto resumed.   Admitted 8/21 with COVID PNA.   He presents to clinic for post-hospital f/u. Following with Paramedicine. Still drinking beer regularly.   ROS: All systems negative except as listed in HPI, PMH and Problem List.  SH:  Social History   Socioeconomic History   Marital status: Divorced    Spouse name: Not on file   Number of children: Not on file   Years of education: Not on file   Highest education level: Not on file  Occupational History   Not on file  Tobacco Use   Smoking status: Never Smoker    Smokeless tobacco: Never Used  Vaping Use   Vaping Use: Never used  Substance and Sexual Activity   Alcohol use: Yes    Alcohol/week: 14.0 standard drinks    Types: 14 Cans of beer per week    Comment: patient endorses at least 1-2 beers daily (04/2020)   Drug use: No   Sexual activity: Not Currently  Other Topics Concern   Not on file  Social History Narrative   Lives at home with his brother who is crippled. He takes care of his brother.    Right handed    Social Determinants of Health   Financial Resource Strain: High Risk   Difficulty of Paying Living Expenses: Hard  Food Insecurity: No Food Insecurity   Worried About Running Out of Food in the Last Year: Never true   Ran Out of Food in the Last Year: Never true  Transportation Needs: No Transportation Needs   Lack of Transportation (Medical): No   Lack of Transportation (Non-Medical): No  Physical Activity:    Days of Exercise per Week: Not on file   Minutes of Exercise per Session: Not on file  Stress:    Feeling of Stress : Not on file  Social Connections:    Frequency of Communication with Friends and Family: Not on file   Frequency of Social Gatherings with Friends and Family: Not on file   Attends Religious Services: Not on file   Active  Member of Clubs or Organizations: Not on file   Attends Banker Meetings: Not on file   Marital Status: Not on file  Intimate Partner Violence:    Fear of Current or Ex-Partner: Not on file   Emotionally Abused: Not on file   Physically Abused: Not on file   Sexually Abused: Not on file    FH:  Family History  Problem Relation Age of Onset   Heart attack Other    Diabetes Neg Hx     Past Medical History:  Diagnosis Date   AICD (automatic cardioverter/defibrillator) present    Alcohol abuse 05/18/2020   Arthritis    "hands; maybe my toes" (12/20/2017)   CAD (coronary artery disease)    Cardiac arrest (HCC) 11/29/2017   hx VF arrest/notes 12/20/2017    Chronic systolic heart failure (HCC) 08/25/2019   Coronary artery disease    Coronary artery disease involving native coronary artery of native heart without angina pectoris 12/07/2017   Essential hypertension 05/18/2020   GERD (gastroesophageal reflux disease)    H/O ETOH abuse    /notes 12/20/2017   High cholesterol    Hypertension    Ischemic cardiomyopathy    a. 08/2019 s/p BSX D232 single lead AICD.   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 6.7 g 0   atorvastatin (LIPITOR) 80 MG tablet Take 1 tablet (80 mg total) by mouth daily at 6 PM. 90 tablet 3   benzonatate (TESSALON PERLES) 100 MG capsule Take 1 capsule (100 mg total) by mouth every 6 (six) hours as needed for cough. 30 capsule 0   Blood Pressure Monitor DEVI Please provide patient with insurance approved blood pressure monitor. I10.0 1 each 0   carvedilol (COREG) 3.125 MG tablet Take 1 tablet (3.125 mg total) by mouth 2 (two) times daily with a meal. 60 tablet 2   ezetimibe (ZETIA) 10 MG tablet Take 1 tablet (10 mg total) by mouth daily. 30 tablet 6   ivabradine (CORLANOR) 5 MG TABS tablet Take 1 tablet (5 mg total) by mouth 2 (two) times daily with a meal. (Patient taking differently: Take 2.5 mg by mouth 2 (two) times daily with a meal. ) 60 tablet 6   levETIRAcetam (KEPPRA) 500 MG tablet Take 1 tablet (500 mg total) by mouth 2 (two) times daily. 180 tablet 3   predniSONE (DELTASONE) 10 MG tablet Take 40 mg daily for 1 day, 30 mg daily for  1 day, 20 mg daily for 1 days,10 mg daily for 1 day, then stop 10 tablet 0   sacubitril-valsartan (ENTRESTO) 24-26 MG Take 1 tablet by mouth 2 (two) times daily. 60 tablet 6   spironolactone (ALDACTONE) 25 MG tablet Take 1 tablet (25 mg total) by mouth daily. 30 tablet 6   thiamine 100 MG tablet Take 1 tablet (100 mg total) by mouth daily. 30 tablet 6   No current facility-administered medications for this encounter.    There were no vitals filed for  this visit. Wt Readings from Last 3 Encounters:  08/14/20 62.6 kg (138 lb)  08/03/20 62.1 kg (137 lb)  07/27/20 64 kg (141 lb)    PHYSICAL EXAM: General:  Thin WM, disheveled looking. No respiratory difficulty HEENT: normal Neck: supple. no JVD. Carotids 2+ bilat; no bruits. No lymphadenopathy or thyromegaly appreciated. Cor: PMI nondisplaced. Regular rate and rhythm No rubs, gallops or murmurs. Lungs: clear. No wheezing  Abdomen: soft, nontender, nondistended. No hepatosplenomegaly. No bruits or masses. Good bowel sounds. Extremities: no cyanosis, clubbing, rash, edema Neuro: alert & oriented x 3, cranial nerves grossly intact. moves all 4 extremities w/o difficulty. Affect pleasant. Appears slightly intoxicated.   ASSESSMENT & PLAN:  1. Seizure: recent diagnosis 09/2019.  - On Keppra 500 mg bid and reports full compliance. Denies any recent seizures  - Followed by Neurology.   2. Chronic systolic CHF with ICM - TTE 11/30/17 25-30% RV normal.  Echo 03/2018: EF 40-45%  RV normal.  - ECHO 5/20 EF ~ 25%. Has ICD.  - ECHO 12/2019 EF 30-35% RV norma; - NYHA II. Volume status trending up slightly in the setting of recent poor compliance w/ home meds/ diuretics. Missed 2 consecutive days of meds this week. Heart logic curve trending up but has not crossed threshold. Discussed importance of strict compliance.  - Continue PRN lasix 20 mg  - Continue Spironolactone 25 mg daily  - Continue Coreg 3.125 mg twice a day.  - Continue Entresto 24-26  mg twice a day. - Continue Corlanor for HR, 2.5 mg bid. HR improved in the 60s today. EKG shows NSR - Check BMP today  - Not a candidate for advanced therapies due to ongoing alcohol abuse.  3. CAD s/p CABG 12/07/2017 - denies chest pain chest pain. No exertional symptoms  - Continue ASA, ? blocker, atorvastatin +zetia.   4. H/o VF arrest in setting of STEMI - has ICD, followed by EP - device interrogation reveals no VT    5. ETOH abuse  - still drinking ~2-3 40 oz beers daily.  - Discussed alcohol cessation. He has no plans to quit.    6. H/O Noncompliance/ Poor Health Literacy  - Followed by HF Paramedicine.    - discussed importance of strict compliance w/ meds  7. HLD: - recent Lipid panel 3/21 showed LDL 86 mg/dL on atorvastatin 80 - Zetia added last visit - not fasting today, will obtain repeat FLP at next f/u appt   F/u w/ Dr. Gala Romney in 3 months, or sooner if needed. Paramedicine to continue to follow closely   Arvilla Meres PA-C  10:11 PM

## 2020-08-23 NOTE — Telephone Encounter (Signed)
CSW called pt on his friends phone to remind of appt tomorrow at clinic- pt confirms he will be able to come tomorrow.  CSW called [facility] Medicaid transport line to confirm transport- they were able to confirm they have transport scheduled and that they have it noted that pt does not have a phone so they will have driver knock on the door if he isn't waiting outside.  CSW will continue to follow and assist as needed  Burna Sis, LCSW Clinical Social Worker [facility] Desk#: [phone] Cell#: [phone]

## 2020-08-24 ENCOUNTER — Inpatient Hospital Stay (HOSPITAL_BASED_OUTPATIENT_CLINIC_OR_DEPARTMENT_OTHER)
Admission: RE | Admit: 2020-08-24 | Discharge: 2020-08-24 | Disposition: A | Discharge status: Home / Self Care | Payer: Medicaid Other | Source: Ambulatory Visit | Attending: Internal Medicine | Admitting: Internal Medicine

## 2020-08-24 ENCOUNTER — Telehealth (HOSPITAL_COMMUNITY): Payer: Self-pay

## 2020-08-24 DIAGNOSIS — I5022 Chronic systolic (congestive) heart failure: Secondary | ICD-10-CM

## 2020-08-24 NOTE — Telephone Encounter (Signed)
Spoke to [name] who reports he missed his transportation for his visit today in the clinic and will need it to be rescheduled. Clinic staff rescheduled same. I will see patient at home in one week. Patient agreed with same.

## 2020-08-24 NOTE — Progress Notes (Signed)
Pt had not arrived for appt so CSW called pt roommate, [name], who states pt was still at home at 10:30 when she left the house.    CSW called [employer] Medicaid transport who was able to speak with the transport company- they report they went to pick up pt but pt refused to get in the car stating that he didn't have an appt today despite CSW calling and speaking with him directly regarding appt and transportation details yesterday.  CSW informed clinic- they are rescheduling pt for later this month- CSW will assist with arranging transport for that appt.  Will continue to follow and assist as needed  Burna Sis, LCSW Clinical Social Worker [facility] Desk#: [phone] Cell#: [phone]

## 2020-08-31 ENCOUNTER — Other Ambulatory Visit (HOSPITAL_COMMUNITY): Payer: Self-pay

## 2020-08-31 MED FILL — CARVEDILOL 3.125 MG TABLET: 3.125 | 30 days supply | Qty: 60 | Fill #1

## 2020-08-31 MED FILL — SPIRONOLACTONE 25 MG TABS: 25 | 30 days supply | Qty: 30 | Fill #3

## 2020-08-31 NOTE — Progress Notes (Signed)
Paramedicine Encounter    Patient ID: [name], male    DOB: [birth year], 59 y.o.   MRN: [id number]   Patient Care Team: Claiborne Rigg, NP as PCP - General (Nurse Practitioner) Marinus Maw, MD as PCP - Electrophysiology (Cardiology)  Patient Active Problem List   Diagnosis Date Noted   Pneumonia due to COVID-19 virus 08/14/2020   Hyponatremia 08/14/2020   Elevated AST (SGOT) 08/14/2020   Prolonged QT interval 08/14/2020   Cardiac arrest, cause unspecified (HCC) 07/06/2020   Left arm cellulitis 05/19/2020   Essential hypertension 05/18/2020   Mixed hyperlipidemia 05/18/2020   Alcohol abuse 05/18/2020   Cellulitis of left upper extremity 05/18/2020   Lactic acidosis 05/18/2020   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, 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) 12/30/2019   ICD (implantable cardioverter-defibrillator) in place 08/29/2019   Chronic systolic heart failure (HCC) 08/25/2019   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/12/2017   Coronary artery disease involving native coronary artery of native heart without angina pectoris 12/07/2017    Current Outpatient Medications:    albuterol (VENTOLIN HFA) 108 (90 Base) MCG/ACT inhaler, Inhale 2 puffs into the lungs every 6 (six) hours as needed for wheezing or shortness of breath., Disp: 6.7 g, Rfl: 0   atorvastatin (LIPITOR) 80 MG tablet, Take 1 tablet (80 mg total) by mouth daily at 6 PM., Disp: 90 tablet, Rfl: 3   benzonatate (TESSALON PERLES) 100 MG capsule, Take 1 capsule (100 mg total) by mouth every 6 (six) hours as needed for cough., Disp: 30 capsule, Rfl: 0   Blood Pressure Monitor DEVI, Please provide patient with insurance approved blood pressure monitor. I10.0, Disp: 1 each, Rfl: 0   carvedilol (COREG) 3.125 MG tablet, Take 1 tablet (3.125 mg total) by mouth 2 (two) times daily with a meal., Disp: 60 tablet, Rfl: 2   ezetimibe (ZETIA) 10 MG tablet, Take 1 tablet (10 mg total) by  mouth daily., Disp: 30 tablet, Rfl: 6   ivabradine (CORLANOR) 5 MG TABS tablet, Take 1 tablet (5 mg total) by mouth 2 (two) times daily with a meal. (Patient taking differently: Take 2.5 mg by mouth 2 (two) times daily with a meal. ), Disp: 60 tablet, Rfl: 6   levETIRAcetam (KEPPRA) 500 MG tablet, Take 1 tablet (500 mg total) by mouth 2 (two) times daily., Disp: 180 tablet, Rfl: 3   predniSONE (DELTASONE) 10 MG tablet, Take 40 mg daily for 1 day, 30 mg daily for 1 day, 20 mg daily for 1 days,10 mg daily for 1 day, then stop, Disp: 10 tablet, Rfl: 0   sacubitril-valsartan (ENTRESTO) 24-26 MG, Take 1 tablet by mouth 2 (two) times daily., Disp: 60 tablet, Rfl: 6   spironolactone (ALDACTONE) 25 MG tablet, Take 1 tablet (25 mg total) by mouth daily., Disp: 30 tablet, Rfl: 6   thiamine 100 MG tablet, Take 1 tablet (100 mg total) by mouth daily., Disp: 30 tablet, Rfl: 6 No Known Allergies   Social History   Socioeconomic History   Marital status: Divorced    Spouse name: Not on file   Number of children: Not on file   Years of education: Not on file   Highest education level: Not on file  Occupational History   Not on file  Tobacco Use   Smoking status: Never Smoker   Smokeless tobacco: Never Used  Vaping Use   Vaping Use: Never used  Substance and Sexual Activity   Alcohol use: Yes  Alcohol/week: 14.0 standard drinks    Types: 14 Cans of beer per week    Comment: patient endorses at least 1-2 beers daily (04/2020)   Drug use: No   Sexual activity: Not Currently  Other Topics Concern   Not on file  Social History Narrative   Lives at home with his brother who is crippled. He takes care of his brother.    Right handed    Social Determinants of Health   Financial Resource Strain: High Risk   Difficulty of Paying Living Expenses: Hard  Food Insecurity: No Food Insecurity   Worried About Running Out of Food in the Last Year: Never true   Ran Out of Food in the  Last Year: Never true  Transportation Needs: No Transportation Needs   Lack of Transportation (Medical): No   Lack of Transportation (Non-Medical): No  Physical Activity:    Days of Exercise per Week: Not on file   Minutes of Exercise per Session: Not on file  Stress:    Feeling of Stress : Not on file  Social Connections:    Frequency of Communication with Friends and Family: Not on file   Frequency of Social Gatherings with Friends and Family: Not on file   Attends Religious Services: Not on file   Active Member of Clubs or Organizations: Not on file   Attends Banker Meetings: Not on file   Marital Status: Not on file  Intimate Partner Violence:    Fear of Current or Ex-Partner: Not on file   Emotionally Abused: Not on file   Physically Abused: Not on file   Sexually Abused: Not on file    Physical Exam Vitals reviewed.  Constitutional:      Appearance: He is normal weight.  HENT:     Head: Normocephalic.     Nose: Nose normal.     Mouth/Throat:     Mouth: Mucous membranes are moist.     Pharynx: Oropharynx is clear.     Comments: Complaints of burning sensation and pain in mouth and throat. Eyes:     Pupils: Pupils are equal, round, and reactive to light.  Cardiovascular:     Rate and Rhythm: Normal rate and regular rhythm.     Pulses: Normal pulses.     Heart sounds: Normal heart sounds.  Pulmonary:     Effort: Pulmonary effort is normal.     Breath sounds: Normal breath sounds.  Abdominal:     General: Abdomen is flat.     Palpations: Abdomen is soft.  Musculoskeletal:        General: Normal range of motion.     Cervical back: Normal range of motion.  Skin:    General: Skin is warm and dry.     Capillary Refill: Capillary refill takes less than 2 seconds.  Neurological:     Mental Status: He is alert. Mental status is at baseline.  Psychiatric:        Mood and Affect: Mood normal.     Arrived for home visit for [name] who  was seated outside on his porch alert and oriented reporting he was feeling "okay" stating he still feels pretty weak, complaining of pain and burning to tongue, mouth and throat. [name] has been compliant with medications since discharge. Medications were verified and confirmed. Pill box filled accordingly. [name]'s vitals were obtained and were as noted in report. Romyn agreed to visit in one week.   Jamaris had a cell phone in which  I made sure he could make calls out and receive calls in. Phone number is #[phone]   I will see Abdulaziz in one week.   Refills: Zetia Spiro Carvedilol  Future Appointments  Date Time Provider Department Center  09/21/2020 10:30 AM [facility] PA/NP [facility] None  10/05/2020  8:30 AM Claiborne Rigg, NP [facility] None  11/29/2020  7:10 AM [facility] DEVICE [facility]OT[facility]     ACTION: Home visit completed Next visit planned for one week

## 2020-09-01 MED FILL — EZETIMIBE 10 MG TABS: 10 | 30 days supply | Qty: 30 | Fill #3

## 2020-09-06 ENCOUNTER — Telehealth (HOSPITAL_COMMUNITY): Payer: Self-pay | Admitting: Licensed Clinical Social Worker

## 2020-09-06 NOTE — Telephone Encounter (Signed)
CSW received email that pt was approved for rental assistance through the [employer].  Pt approved for $3000 of past and future rental assistance.  Will continue to follow and assist as needed  Burna Sis, LCSW Clinical Social Worker [facility] Desk#: [phone] Cell#: [phone]

## 2020-09-07 ENCOUNTER — Other Ambulatory Visit (HOSPITAL_COMMUNITY): Payer: Self-pay

## 2020-09-07 NOTE — Progress Notes (Signed)
Paramedicine Encounter    Patient ID: [name], male    DOB: 01-18-1961, 59 y.o.   MRN: [id number]   Patient Care Team: Jose Rigg, NP as PCP - General (Nurse Practitioner) Jose Maw, MD as PCP - Electrophysiology (Cardiology)  Patient Active Problem List   Diagnosis Date Noted  . Pneumonia due to COVID-19 virus 08/14/2020  . Hyponatremia 08/14/2020  . Elevated AST (SGOT) 08/14/2020  . Prolonged QT interval 08/14/2020  . Cardiac arrest, cause unspecified (HCC) 07/06/2020  . Left arm cellulitis 05/19/2020  . Essential hypertension 05/18/2020  . Mixed hyperlipidemia 05/18/2020  . Alcohol abuse 05/18/2020  . Cellulitis of left upper extremity 05/18/2020  . Lactic acidosis 05/18/2020  . 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 ICD (implantable cardioverter-defibrillator) in place 08/29/2019  . Chronic systolic heart failure (HCC) 08/25/2019  . 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 Coronary artery disease involving native coronary artery of native heart without angina pectoris 12/07/2017    Current Outpatient Medications:  .  albuterol (VENTOLIN HFA) 108 (90 Base) MCG/ACT inhaler, Inhale 2 puffs into the lungs every 6 (six) hours as needed for wheezing or shortness of breath., Disp: 6.7 g, Rfl: 0 .  atorvastatin (LIPITOR) 80 MG tablet, Take 1 tablet (80 mg total) by mouth daily at 6 PM., Disp: 90 tablet, Rfl: 3 .  benzonatate (TESSALON PERLES) 100 MG capsule, Take 1 capsule (100 mg total) by mouth every 6 (six) hours as needed for cough. (Patient not taking: Reported on 08/31/2020), Disp: 30 capsule, Rfl: 0 .  Blood Pressure Monitor DEVI, Please provide patient with insurance approved blood pressure monitor. I10.0 (Patient not taking: Reported on 08/31/2020), Disp: 1 each, Rfl: 0 .  carvedilol (COREG) 3.125 MG tablet, Take 1 tablet (3.125 mg total) by mouth 2 (two) times daily with a meal., Disp: 60  tablet, Rfl: 2 .  ezetimibe (ZETIA) 10 MG tablet, Take 1 tablet (10 mg total) by mouth daily., Disp: 30 tablet, Rfl: 6 .  ivabradine (CORLANOR) 5 MG TABS tablet, Take 1 tablet (5 mg total) by mouth 2 (two) times daily with a meal. (Patient taking differently: Take 2.5 mg by mouth 2 (two) times daily with a meal. ), Disp: 60 tablet, Rfl: 6 .  levETIRAcetam (KEPPRA) 500 MG tablet, Take 1 tablet (500 mg total) by mouth 2 (two) times daily., Disp: 180 tablet, Rfl: 3 .  predniSONE (DELTASONE) 10 MG tablet, Take 40 mg daily for 1 day, 30 mg daily for 1 day, 20 mg daily for 1 days,10 mg daily for 1 day, then stop, Disp: 10 tablet, Rfl: 0 .  sacubitril-valsartan (ENTRESTO) 24-26 MG, Take 1 tablet by mouth 2 (two) times daily., Disp: 60 tablet, Rfl: 6 .  spironolactone (ALDACTONE) 25 MG tablet, Take 1 tablet (25 mg total) by mouth daily., Disp: 30 tablet, Rfl: 6 .  thiamine 100 MG tablet, Take 1 tablet (100 mg total) by mouth daily. (Patient not taking: Reported on 08/31/2020), Disp: 30 tablet, Rfl: 6 No Known Allergies   Social History   Socioeconomic History  . Marital status: Divorced    Spouse name: Not on file  . Number of children: Not on file  . Years of education: Not on file  . Highest education level: Not on file  Occupational History  . Not on file  Tobacco Use  . Smoking status: Never Smoker  . Smokeless tobacco: Never Used  Vaping Use  .  Vaping Use: Never used  Substance and Sexual Activity  . Alcohol use: Yes    Alcohol/week: 14.0 standard drinks    Types: 14 Cans of beer per week    Comment: patient endorses at least 1-2 beers daily (04/2020)  . Drug use: No  . Sexual activity: Not Currently  Other Topics Concern  . Not on file  Social History Narrative   Lives at home with his brother who is crippled. He takes care of his brother.    Right handed    Social Determinants of Health   Financial Resource Strain: High Risk  . Difficulty of Paying Living Expenses: Hard  Food  Insecurity: No Food Insecurity  . Worried About Programme researcher, broadcasting/film/video in the Last Year: Never true  . Ran Out of Food in the Last Year: Never true  Transportation Needs: No Transportation Needs  . Lack of Transportation (Medical): No  . Lack of Transportation (Non-Medical): No  Physical Activity:   . Days of Exercise per Week: Not on file  . Minutes of Exercise per Session: Not on file  Stress:   . Feeling of Stress : Not on file  Social Connections:   . Frequency of Communication with Friends and Family: Not on file  . Frequency of Social Gatherings with Friends and Family: Not on file  . Attends Religious Services: Not on file  . Active Member of Clubs or Organizations: Not on file  . Attends Banker Meetings: Not on file  . Marital Status: Not on file  Intimate Partner Violence:   . Fear of Current or Ex-Partner: Not on file  . Emotionally Abused: Not on file  . Physically Abused: Not on file  . Sexually Abused: Not on file    Physical Exam Vitals reviewed.  Constitutional:      Appearance: He is normal weight.  HENT:     Head: Normocephalic.     Nose: Nose normal.     Mouth/Throat:     Mouth: Mucous membranes are moist.     Pharynx: Oropharynx is clear.  Eyes:     Pupils: Pupils are equal, round, and reactive to light.  Cardiovascular:     Rate and Rhythm: Normal rate and regular rhythm.     Pulses: Normal pulses.     Heart sounds: Normal heart sounds.  Pulmonary:     Effort: Pulmonary effort is normal.     Breath sounds: Normal breath sounds.  Abdominal:     General: Abdomen is flat.     Palpations: Abdomen is soft.  Musculoskeletal:        General: Normal range of motion.     Cervical back: Normal range of motion.     Right lower leg: No edema.     Left lower leg: No edema.  Skin:    General: Skin is warm and dry.     Capillary Refill: Capillary refill takes less than 2 seconds.  Neurological:     Mental Status: He is alert. Mental status is at  baseline.  Psychiatric:        Mood and Affect: Mood normal.     Arrived for home visit for [facility] who was alert and oriented seated in a chair and reporting feeling good. [name] was not compliant with his medications over the last week. Dmarius was educated on importance of taking his medications properly. Jimi verbalized understanding. Medications were verified and confirmed. Pill box filled accordingly. Vitals were obtained and are as noted.  Harrison had no complaints on assessment. Lancelot agreed to meet in clinic next week. He understands to bring all medication and pill box. Home visit complete.   Refills- Spironolactone Zetia Carvedilol Entresto    Future Appointments  Date Time Provider Department Center  09/21/2020 10:30 AM [facility] PA/NP [facility] None  10/05/2020  8:30 AM Jose Rigg, NP [facility] None  11/29/2020  7:10 AM [facility]     ACTION: Home visit completed Next visit planned for one week

## 2020-09-10 ENCOUNTER — Telehealth (HOSPITAL_COMMUNITY): Payer: Self-pay | Admitting: Licensed Clinical Social Worker

## 2020-09-10 NOTE — Telephone Encounter (Signed)
CSW called [facility] Medicaid transport and set up ride for pt to Clinic appt on 9/28 and PCP appt on 10/12  9/28 appt pt to be picked up between 9:15-9:45am 10/12 appt pt to be picked up between 7:15-7:45am  CSW attempted to call pt to inform but phone when straight to VM and VM has not been set up- CSW will attempt to call again later  Burna Sis, LCSW Clinical Social Worker [facility] Desk#: [phone] Cell#: [phone]

## 2020-09-13 ENCOUNTER — Telehealth (HOSPITAL_COMMUNITY): Payer: Self-pay | Admitting: Pharmacy Technician

## 2020-09-13 MED FILL — EZETIMIBE 10 MG TABS: 10 | 30 days supply | Qty: 30 | Fill #3

## 2020-09-13 MED FILL — SPIRONOLACTONE 25 MG TABS: 25 | 30 days supply | Qty: 30 | Fill #3

## 2020-09-13 MED FILL — ENTRESTO 24 MG-26 MG TABLET: 24-26 | 14 days supply | Qty: 28 | Fill #2

## 2020-09-13 MED FILL — CARVEDILOL 3.125 MG TABLET: 3.125 | 30 days supply | Qty: 60 | Fill #1

## 2020-09-13 NOTE — Telephone Encounter (Signed)
Patient Advocate Encounter   Received notification from [facility] that prior authorization for [name] is required.   PA submitted on CoverMyMeds Key [id number] Status is pending   Will continue to follow.

## 2020-09-14 ENCOUNTER — Other Ambulatory Visit (HOSPITAL_COMMUNITY): Payer: Self-pay

## 2020-09-14 NOTE — Progress Notes (Signed)
Paramedicine Encounter    Patient ID: [name], male    DOB: 04/04/1961, 59 y.o.   MRN: [id number]   Patient Care Team: Claiborne Rigg, NP as PCP - General (Nurse Practitioner) Marinus Maw, MD as PCP - Electrophysiology (Cardiology)  Patient Active Problem List   Diagnosis Date Noted  . Pneumonia due to COVID-19 virus 08/14/2020  . Hyponatremia 08/14/2020  . Elevated AST (SGOT) 08/14/2020  . Prolonged QT interval 08/14/2020  . Cardiac arrest, cause unspecified (HCC) 07/06/2020  . Left arm cellulitis 05/19/2020  . Essential hypertension 05/18/2020  . Mixed hyperlipidemia 05/18/2020  . Alcohol abuse 05/18/2020  . Cellulitis of left upper extremity 05/18/2020  . Lactic acidosis 05/18/2020  . 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 ICD (implantable cardioverter-defibrillator) in place 08/29/2019  . Chronic systolic heart failure (HCC) 08/25/2019  . 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 Coronary artery disease involving native coronary artery of native heart without angina pectoris 12/07/2017    Current Outpatient Medications:  .  albuterol (VENTOLIN HFA) 108 (90 Base) MCG/ACT inhaler, Inhale 2 puffs into the lungs every 6 (six) hours as needed for wheezing or shortness of breath., Disp: 6.7 g, Rfl: 0 .  atorvastatin (LIPITOR) 80 MG tablet, Take 1 tablet (80 mg total) by mouth daily at 6 PM., Disp: 90 tablet, Rfl: 3 .  benzonatate (TESSALON PERLES) 100 MG capsule, Take 1 capsule (100 mg total) by mouth every 6 (six) hours as needed for cough. (Patient not taking: Reported on 08/31/2020), Disp: 30 capsule, Rfl: 0 .  Blood Pressure Monitor DEVI, Please provide patient with insurance approved blood pressure monitor. I10.0 (Patient not taking: Reported on 08/31/2020), Disp: 1 each, Rfl: 0 .  carvedilol (COREG) 3.125 MG tablet, Take 1 tablet (3.125 mg total) by mouth 2 (two) times daily with a meal., Disp: 60  tablet, Rfl: 2 .  ezetimibe (ZETIA) 10 MG tablet, Take 1 tablet (10 mg total) by mouth daily., Disp: 30 tablet, Rfl: 6 .  ivabradine (CORLANOR) 5 MG TABS tablet, Take 1 tablet (5 mg total) by mouth 2 (two) times daily with a meal. (Patient taking differently: Take 2.5 mg by mouth 2 (two) times daily with a meal. ), Disp: 60 tablet, Rfl: 6 .  levETIRAcetam (KEPPRA) 500 MG tablet, Take 1 tablet (500 mg total) by mouth 2 (two) times daily., Disp: 180 tablet, Rfl: 3 .  predniSONE (DELTASONE) 10 MG tablet, Take 40 mg daily for 1 day, 30 mg daily for 1 day, 20 mg daily for 1 days,10 mg daily for 1 day, then stop (Patient not taking: Reported on 09/07/2020), Disp: 10 tablet, Rfl: 0 .  sacubitril-valsartan (ENTRESTO) 24-26 MG, Take 1 tablet by mouth 2 (two) times daily., Disp: 60 tablet, Rfl: 6 .  spironolactone (ALDACTONE) 25 MG tablet, Take 1 tablet (25 mg total) by mouth daily., Disp: 30 tablet, Rfl: 6 .  thiamine 100 MG tablet, Take 1 tablet (100 mg total) by mouth daily. (Patient not taking: Reported on 08/31/2020), Disp: 30 tablet, Rfl: 6 No Known Allergies   Social History   Socioeconomic History  . Marital status: Divorced    Spouse name: Not on file  . Number of children: Not on file  . Years of education: Not on file  . Highest education level: Not on file  Occupational History  . Not on file  Tobacco Use  . Smoking status: Never Smoker  . Smokeless  tobacco: Never Used  Vaping Use  . Vaping Use: Never used  Substance and Sexual Activity  . Alcohol use: Yes    Alcohol/week: 14.0 standard drinks    Types: 14 Cans of beer per week    Comment: patient endorses at least 1-2 beers daily (04/2020)  . Drug use: No  . Sexual activity: Not Currently  Other Topics Concern  . Not on file  Social History Narrative   Lives at home with his brother who is crippled. He takes care of his brother.    Right handed    Social Determinants of Health   Financial Resource Strain: High Risk  .  Difficulty of Paying Living Expenses: Hard  Food Insecurity: No Food Insecurity  . Worried About Programme researcher, broadcasting/film/video in the Last Year: Never true  . Ran Out of Food in the Last Year: Never true  Transportation Needs: No Transportation Needs  . Lack of Transportation (Medical): No  . Lack of Transportation (Non-Medical): No  Physical Activity:   . Days of Exercise per Week: Not on file  . Minutes of Exercise per Session: Not on file  Stress:   . Feeling of Stress : Not on file  Social Connections:   . Frequency of Communication with Friends and Family: Not on file  . Frequency of Social Gatherings with Friends and Family: Not on file  . Attends Religious Services: Not on file  . Active Member of Clubs or Organizations: Not on file  . Attends Banker Meetings: Not on file  . Marital Status: Not on file  Intimate Partner Violence:   . Fear of Current or Ex-Partner: Not on file  . Emotionally Abused: Not on file  . Physically Abused: Not on file  . Sexually Abused: Not on file    Physical Exam Vitals reviewed.  Constitutional:      Appearance: He is normal weight.  HENT:     Head: Normocephalic.     Nose: Nose normal.     Mouth/Throat:     Mouth: Mucous membranes are moist.  Eyes:     Pupils: Pupils are equal, round, and reactive to light.  Cardiovascular:     Rate and Rhythm: Normal rate and regular rhythm.     Pulses: Normal pulses.     Heart sounds: Normal heart sounds.  Pulmonary:     Effort: Pulmonary effort is normal.     Breath sounds: Normal breath sounds.  Abdominal:     General: Abdomen is flat.     Palpations: Abdomen is soft.  Musculoskeletal:        General: Normal range of motion.     Cervical back: Normal range of motion.     Right lower leg: No edema.     Left lower leg: No edema.  Skin:    General: Skin is warm and dry.     Capillary Refill: Capillary refill takes less than 2 seconds.  Neurological:     General: No focal deficit  present.     Mental Status: He is alert. Mental status is at baseline.     Arrived for home visit for Ennio who was alert and oriented with no complaints. Bradely was mostly compliant with his medicines. Medications were reviewed and confirmed. Pill box filled confirmed. Vitals were obtained. Lavaris was reminded of appointments, I gave list of same. Home visit complete.    Refills: NONE    Future Appointments  Date Time Provider Department Center  09/21/2020  10:30 AM [facility] PA/NP [facility] None  10/05/2020  8:30 AM Claiborne Rigg, NP [facility] None  11/29/2020  7:10 AM [facility]     ACTION: Home visit completed Next visit planned for one week

## 2020-09-14 NOTE — Telephone Encounter (Signed)
[facility] Patient Advocate Encounter  Prior Authorization for [name] has been approved.    PA# [id number] Effective dates: 09/13/20 through 09/13/21  Archer Asa, CPhT

## 2020-09-20 ENCOUNTER — Telehealth (HOSPITAL_COMMUNITY): Payer: Self-pay | Admitting: Licensed Clinical Social Worker

## 2020-09-20 NOTE — Telephone Encounter (Signed)
CSW attempted to call pt to remind of appt tomorrow- unable to leave VM but texted with appt details.  Paramedic had reminded of appt last week so will hopefully not have any barriers to attending  Will continue to follow and assist as needed  Burna Sis, LCSW Clinical Social Worker [facility] Desk#: [phone] Cell#: [phone]

## 2020-09-21 ENCOUNTER — Other Ambulatory Visit: Payer: Self-pay

## 2020-09-21 ENCOUNTER — Other Ambulatory Visit (HOSPITAL_COMMUNITY): Payer: Self-pay

## 2020-09-21 ENCOUNTER — Ambulatory Visit (HOSPITAL_COMMUNITY)
Admission: RE | Admit: 2020-09-21 | Discharge: 2020-09-21 | Disposition: A | Discharge status: Home / Self Care | Payer: Medicaid Other | Source: Ambulatory Visit | Attending: Internal Medicine | Admitting: Internal Medicine

## 2020-09-21 ENCOUNTER — Encounter (HOSPITAL_COMMUNITY): Payer: Self-pay

## 2020-09-21 VITALS — BP 130/85 | HR 86 | Wt 136.6 lb

## 2020-09-21 DIAGNOSIS — Z79899 Other long term (current) drug therapy: Secondary | ICD-10-CM | POA: Insufficient documentation

## 2020-09-21 DIAGNOSIS — I2510 Atherosclerotic heart disease of native coronary artery without angina pectoris: Secondary | ICD-10-CM | POA: Insufficient documentation

## 2020-09-21 DIAGNOSIS — I252 Old myocardial infarction: Secondary | ICD-10-CM | POA: Insufficient documentation

## 2020-09-21 DIAGNOSIS — I5022 Chronic systolic (congestive) heart failure: Secondary | ICD-10-CM | POA: Insufficient documentation

## 2020-09-21 DIAGNOSIS — Z8674 Personal history of sudden cardiac arrest: Secondary | ICD-10-CM | POA: Insufficient documentation

## 2020-09-21 DIAGNOSIS — I110 Hypertensive heart disease with heart failure: Secondary | ICD-10-CM | POA: Diagnosis not present

## 2020-09-21 DIAGNOSIS — E782 Mixed hyperlipidemia: Secondary | ICD-10-CM | POA: Diagnosis not present

## 2020-09-21 DIAGNOSIS — K219 Gastro-esophageal reflux disease without esophagitis: Secondary | ICD-10-CM | POA: Insufficient documentation

## 2020-09-21 DIAGNOSIS — Z95810 Presence of automatic (implantable) cardiac defibrillator: Secondary | ICD-10-CM | POA: Diagnosis not present

## 2020-09-21 DIAGNOSIS — Z8249 Family history of ischemic heart disease and other diseases of the circulatory system: Secondary | ICD-10-CM | POA: Insufficient documentation

## 2020-09-21 DIAGNOSIS — Z8616 Personal history of COVID-19: Secondary | ICD-10-CM | POA: Diagnosis not present

## 2020-09-21 DIAGNOSIS — Z951 Presence of aortocoronary bypass graft: Secondary | ICD-10-CM | POA: Diagnosis not present

## 2020-09-21 DIAGNOSIS — F1010 Alcohol abuse, uncomplicated: Secondary | ICD-10-CM | POA: Diagnosis not present

## 2020-09-21 DIAGNOSIS — Z9119 Patient's noncompliance with other medical treatment and regimen: Secondary | ICD-10-CM | POA: Insufficient documentation

## 2020-09-21 LAB — LIPID PANEL
Cholesterol: 142 mg/dL (ref 0–200)
HDL: 65 mg/dL (ref >40)
LDL Cholesterol: 68 mg/dL (ref 0–99)
Total CHOL/HDL Ratio: 2.2 RATIO
Triglycerides: 43 mg/dL (ref <150)
VLDL: 9 mg/dL (ref 0–40)

## 2020-09-21 LAB — COMPREHENSIVE METABOLIC PANEL
ALT: 22 U/L (ref 0–44)
AST: 27 U/L (ref 15–41)
Albumin: 3.4 g/dL — ABNORMAL LOW (ref 3.5–5.0)
Alkaline Phosphatase: 57 U/L (ref 38–126)
Anion gap: 8 (ref 5–15)
BUN: 11 mg/dL (ref 6–20)
CO2: 25 mmol/L (ref 22–32)
Calcium: 8.8 mg/dL — ABNORMAL LOW (ref 8.9–10.3)
Chloride: 106 mmol/L (ref 98–111)
Creatinine, Ser: 0.69 mg/dL (ref 0.61–1.24)
GFR calc Af Amer: >60 mL/min (ref >60)
GFR calc non Af Amer: >60 mL/min (ref >60)
Glucose, Bld: 99 mg/dL (ref 70–99)
Potassium: 4.3 mmol/L (ref 3.5–5.1)
Sodium: 139 mmol/L (ref 135–145)
Total Bilirubin: 0.9 mg/dL (ref 0.3–1.2)
Total Protein: 6.3 g/dL — ABNORMAL LOW (ref 6.5–8.1)

## 2020-09-21 MED ORDER — IVABRADINE HCL 5 MG PO TABS
5.0000 mg | ORAL_TABLET | Freq: Two times a day (BID) | ORAL | 3 refills | Status: DC
Start: 2020-09-21 — End: 2021-03-24

## 2020-09-21 MED ORDER — EPLERENONE 25 MG PO TABS
25.0000 mg | ORAL_TABLET | Freq: Every day | ORAL | 3 refills | Status: DC
Start: 2020-09-21 — End: 2021-02-09

## 2020-09-21 NOTE — Progress Notes (Signed)
Paramedicine Encounter  Met with [name] in the clinic today where he was seen by PA Ellen Henri who stopped Spironolactone, Started Epelnerone, and Increased Corlanor to 83m BID. [place] labs in one week. I will deliver new medication tomorrow AM.  I also assisted today in getting [place] food items. Same were delivered at 1300.   Clinic visit complete.   ACTION: Home visit completed Next visit planned for one week

## 2020-09-21 NOTE — Progress Notes (Signed)
CSW met with pt during clinic visit to check in.  Pt has lost he new phone but plans to get another one this Friday when he gets his SSI check.  Pt also reports he is currently low on food and has no way to get more.  Won't get his SSI check till Friday and food stamps will not come back until 10/15.  Pt has been going to a local food pantry but cannot get sufficient food from this.  Pt would have no way to get to other [employer] in the area and CSW unable to transport him there as he would have no phone to communicate with transport or inform CSW when he is ready for pick up.  Paramedic has food pantry available at work and can take to pt.  CSW will also cover some fresh food items through petty case but pt does not have working refrigerator so he is unable to store items that need to be kept cool. CSW informed landlord of broken refrigerator so they will plan to take a look at it.  Pt also needing transport to lab appt next week- scheduled through medicaid- pick up time between 8:45-9:15am reservation number [phone], pt will be picked up from appt at 10:30am  CSW will continue to follow and assist as needed  [name], [facility] Desk#: [phone] Cell#: [phone]

## 2020-09-21 NOTE — Patient Instructions (Addendum)
STOP Spironolactone  START Eplerenone 25mg  Daily  INCREASE Corlanor 5mg  Twice Daily  Labs done today, your results will be available in MyChart, we will contact you for abnormal readings.  Your physician recommends that you return for repeat labs in 1-2 weeks  Your physician recommends that you schedule a follow up appointment in 3 months with our APP clinic  If you have any questions or concerns before your next appointment please send a message through [place] or call our office at [phone].    TO LEAVE A MESSAGE FOR THE NURSE SELECT OPTION 2, PLEASE LEAVE A MESSAGE INCLUDING: . YOUR NAME . DATE OF BIRTH . CALL BACK NUMBER . REASON FOR CALL**this is important as we prioritize the call backs  YOU WILL RECEIVE A CALL BACK THE SAME DAY AS LONG AS YOU CALL BEFORE 4:00 PM

## 2020-09-21 NOTE — Progress Notes (Signed)
ADVANCED HF CLINIC NOTE  PCP: None  Primary Cardiologist: Dr Gala Romney   Reason for Visit: F/u for chronic systolic heart failure   HPI: [name] is a 59 y.o. male with h/o ETOH, VF Arrest, CAD s/p CABG 12/07/17, and ICM with EF 25-30%.   Admitted 12/18 with VF arrest while chopping wood. Received CPR in the field. R/LHC with severe 3vD as below and cardiogenic shock requiring IABP. Required milrinone with continued shock. Pt improved and IABP removed 12/10, but pt had recurrent VF arrest so IABP replaced. Stabilized and underwent CABG 12/07/17. Tolerated gradual wean of meds and extubation with no furthe events. HF medications titrated as tolerated.  Echo 04/2019  LVEF at 25%.    9/20 implantation of a [employer] single chamber ICD.   He had routine clinic f/u on 10/23/19 and while in exam room had a witnessed seizure  with loss of pulse. CPR started with quick ROSC. He did not require shock. Taken to the ED and had another seizure. Neurology consulted. Started on Keppra. HS Trop 10>12.  ICD interrogated. No arrhythmias noted. Admitted to [facility] on 12/09/19 with recurrent seizures in setting of noncompliance with his Keppra.   Admitted 01/08/20 after syncopal episode. Had AKI so HF meds stopped. Given IV fluids and started back on coreg, spiro, and lasix. [name] was stopped. Had return f/u 2/21 and AKI had resolved. Entresto resumed.   Admitted 8/21 with COVID PNA. He was set up for post hospital f/u in [facility] on 8/31 but no showed to the appointment.   He returns now for f/u. Following with Paramedicine. Still drinking beer regularly. No falls. His fluid status is good on exam and by device interrogation. HeartLogic Score is 0. No VT. He does not drive. He walks most places. No significant exertional dyspnea or CP. He takes his meds most days. BP is well controlled. HR ranges from 80s-low 100s. EKG today shows NSR w/ PVC. His only complaint is bilateral  breast tenderness, which may be from spironolactone.     ROS: All systems negative except as listed in HPI, PMH and Problem List.  SH:  Social History   Socioeconomic History  . Marital status: Divorced    Spouse name: Not on file  . Number of children: Not on file  . Years of education: Not on file  . Highest education level: Not on file  Occupational History  . Not on file  Tobacco Use  . Smoking status: Never Smoker  . Smokeless tobacco: Never Used  Vaping Use  . Vaping Use: Never used  Substance and Sexual Activity  . Alcohol use: Yes    Alcohol/week: 14.0 standard drinks    Types: 14 Cans of beer per week    Comment: patient endorses at least 1-2 beers daily (04/2020)  . Drug use: No  . Sexual activity: Not Currently  Other Topics Concern  . Not on file  Social History Narrative   Lives at home with his brother who is crippled. He takes care of his brother.    Right handed    Social Determinants of Health   Financial Resource Strain: High Risk  . Difficulty of Paying Living Expenses: Hard  Food Insecurity: No Food Insecurity  . Worried About Programme researcher, broadcasting/film/video in the Last Year: Never true  . Ran Out of Food in the Last Year: Never true  Transportation Needs: No Transportation Needs  . Lack of Transportation (Medical): No  .  Lack of Transportation (Non-Medical): No  Physical Activity:   . Days of Exercise per Week: Not on file  . Minutes of Exercise per Session: Not on file  Stress:   . Feeling of Stress : Not on file  Social Connections:   . Frequency of Communication with Friends and Family: Not on file  . Frequency of Social Gatherings with Friends and Family: Not on file  . Attends Religious Services: Not on file  . Active Member of Clubs or Organizations: Not on file  . Attends Banker Meetings: Not on file  . Marital Status: Not on file  Intimate Partner Violence:   . Fear of Current or Ex-Partner: Not on file  . Emotionally Abused:  Not on file  . Physically Abused: Not on file  . Sexually Abused: Not on file    FH:  Family History  Problem Relation Age of Onset  . Heart attack Other   . Diabetes Neg Hx     Past Medical History:  Diagnosis Date  . AICD (automatic cardioverter/defibrillator) present   . Alcohol abuse 05/18/2020  . Arthritis    "hands; maybe my toes" (12/20/2017)  . CAD (coronary artery disease)   . Cardiac arrest (HCC) 11/29/2017   hx VF arrest/notes 12/20/2017  . Chronic systolic heart failure (HCC) 08/25/2019  . Coronary artery disease   . Coronary artery disease involving native coronary artery of native heart without angina pectoris 12/07/2017  . Essential hypertension 05/18/2020  . GERD (gastroesophageal reflux disease)   . H/O ETOH abuse    /notes 12/20/2017  . High cholesterol   . Hypertension   . Ischemic cardiomyopathy    a. 08/2019 s/p BSX D232 single lead AICD.  Marland Kitchen Mixed hyperlipidemia 05/18/2020  . Seizure (HCC) 10/21/2019  . Seizures (HCC) ~ 2016; ~ 2017   "I've had a couple; I was at work" (12/20/2017)  . 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 Syncope and collapse    "last few days" (12/20/2017)    Current Outpatient Medications  Medication Sig Dispense Refill  . albuterol (VENTOLIN HFA) 108 (90 Base) MCG/ACT inhaler Inhale 2 puffs into the lungs every 6 (six) hours as needed for wheezing or shortness of breath. 6.7 g 0  . atorvastatin (LIPITOR) 80 MG tablet Take 1 tablet (80 mg total) by mouth daily at 6 PM. 90 tablet 3  . carvedilol (COREG) 3.125 MG tablet Take 1 tablet (3.125 mg total) by mouth 2 (two) times daily with a meal. 60 tablet 2  . ezetimibe (ZETIA) 10 MG tablet Take 1 tablet (10 mg total) by mouth daily. 30 tablet 6  . ivabradine (CORLANOR) 5 MG TABS tablet Take 2.5 mg by mouth 2 (two) times daily with a meal.    . levETIRAcetam (KEPPRA) 500 MG tablet Take 1 tablet (500 mg  total) by mouth 2 (two) times daily. 180 tablet 3  . sacubitril-valsartan (ENTRESTO) 24-26 MG Take 1 tablet by mouth 2 (two) times daily. 60 tablet 6  . spironolactone (ALDACTONE) 25 MG tablet Take 1 tablet (25 mg total) by mouth daily. 30 tablet 6   No current facility-administered medications for this encounter.    Vitals:   09/21/20 1036  BP: 130/85  Pulse: 86  SpO2: 97%  Weight: 62 kg (136 lb 9.6 oz)   Wt Readings from Last 3 Encounters:  09/21/20 62 kg (136 lb 9.6 oz)  09/14/20 63 kg (138 lb 14.4  oz)  09/07/20 62.1 kg (137 lb)   PHYSICAL EXAM: General:  Thin white male. No respiratory difficulty HEENT: normal Neck: supple. no JVD. Carotids 2+ bilat; no bruits. No lymphadenopathy or thyromegaly appreciated. Cor: PMI nondisplaced. Regular rate and rhythm No rubs, gallops or murmurs. Lungs: clear. No wheezing  Chest Wall: + bilateral breast tenderness  Abdomen: soft, nontender, nondistended. No hepatosplenomegaly. No bruits or masses. Good bowel sounds. Extremities: no cyanosis, clubbing, rash, edema Neuro: alert & oriented x 3, cranial nerves grossly intact. moves all 4 extremities w/o difficulty. Affect pleasant. Appears slightly intoxicated.   ASSESSMENT & PLAN:  1. Seizures: - denies any recent recurrence  - Continue Keppra 500 mg bid  - Followed by Neurology.   2. Chronic systolic CHF with ICM - TTE 11/30/17 25-30% RV normal.  Echo 03/2018: EF 40-45%  RV normal.  - ECHO 5/20 EF ~ 25%. Has ICD.  - ECHO 12/2019 EF 30-35% RV normal - NYHA II. Euolemic on exam and by HeartLogic score - Continue PRN lasix 20 mg  - Stop Spironolactone given breast tenderness, change to eplerenone 25 mg daily  - Continue Coreg 3.125 mg twice a day.  - Continue Entresto 24-26 mg twice a day. He did not tolerate 49-51 mg dose due to hypotension  - Continue Corlanor. Increase to 5 mg bid for better rate control. EKG shows NSR - Check BMP today and again in 7 days - Not a candidate for  advanced therapies due to ongoing alcohol abuse.  3. CAD s/p CABG 12/07/2017 - stable w/o CP  - Continue ASA, ? blocker, atorvastatin +zetia.   4. H/o VF arrest in setting of STEMI - has ICD, followed by EP - device interrogation reveals no VT    5. ETOH abuse  - still drinking ~2-3 40 oz beers daily.  - Discussed alcohol cessation. He has no plans to quit.    6. H/O Noncompliance/ Poor Health Literacy  - Followed by HF Paramedicine.    - discussed importance of strict compliance w/ meds  7. HLD: - Lipid panel 3/21 showed LDL 86 mg/dL on atorvastatin 80 - Zetia added last visit - repeat FLP and HFTs today   Continue w/ paramedicine. F/u in 3 months   Brentney Goldbach PA-C  11:11 AM

## 2020-09-22 ENCOUNTER — Ambulatory Visit (INDEPENDENT_AMBULATORY_CARE_PROVIDER_SITE_OTHER): Payer: Self-pay | Admitting: Emergency Medicine

## 2020-09-22 DIAGNOSIS — Z95810 Presence of automatic (implantable) cardiac defibrillator: Secondary | ICD-10-CM

## 2020-09-22 MED FILL — EPLERENONE 25 MG TABS: 25 | 30 days supply | Qty: 30 | Fill #0

## 2020-09-22 MED FILL — CORLANOR 5 MG TABLET: 5 | 30 days supply | Qty: 60 | Fill #0

## 2020-09-23 ENCOUNTER — Telehealth (HOSPITAL_COMMUNITY): Payer: Self-pay | Admitting: Licensed Clinical Social Worker

## 2020-09-23 LAB — CUP PACEART REMOTE DEVICE CHECK
Battery Remaining Longevity: 168 mo
Battery Remaining Percentage: 100 %
Brady Statistic RV Percent Paced: 0 %
Date Time Interrogation Session: 20210928103200
HighPow Impedance: 58 Ohm
Implantable Lead Implant Date: 20200904
Implantable Lead Location: 753860
Implantable Lead Model: 293
Implantable Lead Serial Number: 443717
Implantable Pulse Generator Implant Date: 20200904
Lead Channel Impedance Value: 442 Ohm
Lead Channel Setting Pacing Amplitude: 2.5 V
Lead Channel Setting Pacing Pulse Width: 0.4 ms
Lead Channel Setting Sensing Sensitivity: 0.5 mV
Pulse Gen Serial Number: 271403

## 2020-09-23 NOTE — Telephone Encounter (Signed)
CSW informed that lab appt for pt was changed till 10/14.  CSW canceled transport for pt next week and rescheduled till 10/14  Pick up between 9:15-9:45am.  Pt will be picked up from appt at clinic at 10:45am. Confirmation #: [id number]  Burna Sis, LCSW Clinical Social Worker [facility] Desk#: [phone] Cell#: [phone]

## 2020-09-24 ENCOUNTER — Telehealth (HOSPITAL_COMMUNITY): Payer: Self-pay | Admitting: Licensed Clinical Social Worker

## 2020-09-24 NOTE — Telephone Encounter (Signed)
Pt called CSW to provide with new phone number- number updated in patient chart.  Will continue to follow and assist as needed  Burna Sis, LCSW Clinical Social Worker [facility] Desk#: [phone] Cell#: [phone]

## 2020-09-27 NOTE — Progress Notes (Signed)
Remote ICD transmission.   

## 2020-09-28 ENCOUNTER — Other Ambulatory Visit (HOSPITAL_COMMUNITY): Payer: Self-pay

## 2020-09-28 NOTE — Progress Notes (Signed)
Paramedicine Encounter    Patient ID: [name], male    DOB: [birth year], 59 y.o.   MRN: [id number]   Patient Care Team: Claiborne Rigg, NP as PCP - General (Nurse Practitioner) Marinus Maw, MD as PCP - Electrophysiology (Cardiology)  Patient Active Problem List   Diagnosis Date Noted  . Pneumonia due to COVID-19 virus 08/14/2020  . Hyponatremia 08/14/2020  . Elevated AST (SGOT) 08/14/2020  . Prolonged QT interval 08/14/2020  . Cardiac arrest, cause unspecified (HCC) 07/06/2020  . Left arm cellulitis 05/19/2020  . Essential hypertension 05/18/2020  . Mixed hyperlipidemia 05/18/2020  . Alcohol abuse 05/18/2020  . Cellulitis of left upper extremity 05/18/2020  . Lactic acidosis 05/18/2020  . 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 ICD (implantable cardioverter-defibrillator) in place 08/29/2019  . Chronic systolic heart failure (HCC) 08/25/2019  . 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 Coronary artery disease involving native coronary artery of native heart without angina pectoris 12/07/2017    Current Outpatient Medications:  .  albuterol (VENTOLIN HFA) 108 (90 Base) MCG/ACT inhaler, Inhale 2 puffs into the lungs every 6 (six) hours as needed for wheezing or shortness of breath., Disp: 6.7 g, Rfl: 0 .  atorvastatin (LIPITOR) 80 MG tablet, Take 1 tablet (80 mg total) by mouth daily at 6 PM., Disp: 90 tablet, Rfl: 3 .  carvedilol (COREG) 3.125 MG tablet, Take 1 tablet (3.125 mg total) by mouth 2 (two) times daily with a meal., Disp: 60 tablet, Rfl: 2 .  eplerenone (INSPRA) 25 MG tablet, Take 1 tablet (25 mg total) by mouth daily., Disp: 30 tablet, Rfl: 3 .  ezetimibe (ZETIA) 10 MG tablet, Take 1 tablet (10 mg total) by mouth daily., Disp: 30 tablet, Rfl: 6 .  ivabradine (CORLANOR) 5 MG TABS tablet, Take 1 tablet (5 mg total) by mouth 2 (two) times daily with a meal., Disp: 30 tablet, Rfl: 3 .   levETIRAcetam (KEPPRA) 500 MG tablet, Take 1 tablet (500 mg total) by mouth 2 (two) times daily., Disp: 180 tablet, Rfl: 3 .  sacubitril-valsartan (ENTRESTO) 24-26 MG, Take 1 tablet by mouth 2 (two) times daily., Disp: 60 tablet, Rfl: 6 No Known Allergies   Social History   Socioeconomic History  . Marital status: Divorced    Spouse name: Not on file  . Number of children: Not on file  . Years of education: Not on file  . Highest education level: Not on file  Occupational History  . Not on file  Tobacco Use  . Smoking status: Never Smoker  . Smokeless tobacco: Never Used  Vaping Use  . Vaping Use: Never used  Substance and Sexual Activity  . Alcohol use: Yes    Alcohol/week: 14.0 standard drinks    Types: 14 Cans of beer per week    Comment: patient endorses at least 1-2 beers daily (04/2020)  . Drug use: No  . Sexual activity: Not Currently  Other Topics Concern  . Not on file  Social History Narrative   Lives at home with his brother who is crippled. He takes care of his brother.    Right handed    Social Determinants of Health   Financial Resource Strain: High Risk  . Difficulty of Paying Living Expenses: Hard  Food Insecurity: No Food Insecurity  . Worried About Programme researcher, broadcasting/film/video in the Last Year: Never true  . Ran Out of Food in the Last  Year: Never true  Transportation Needs: No Transportation Needs  . Lack of Transportation (Medical): No  . Lack of Transportation (Non-Medical): No  Physical Activity:   . Days of Exercise per Week: Not on file  . Minutes of Exercise per Session: Not on file  Stress:   . Feeling of Stress : Not on file  Social Connections:   . Frequency of Communication with Friends and Family: Not on file  . Frequency of Social Gatherings with Friends and Family: Not on file  . Attends Religious Services: Not on file  . Active Member of Clubs or Organizations: Not on file  . Attends Banker Meetings: Not on file  . Marital  Status: Not on file  Intimate Partner Violence:   . Fear of Current or Ex-Partner: Not on file  . Emotionally Abused: Not on file  . Physically Abused: Not on file  . Sexually Abused: Not on file    Physical Exam Vitals reviewed.  Constitutional:      Appearance: He is normal weight.  HENT:     Head: Normocephalic.     Nose: Nose normal.     Mouth/Throat:     Mouth: Mucous membranes are moist.  Eyes:     Pupils: Pupils are equal, round, and reactive to light.  Cardiovascular:     Rate and Rhythm: Normal rate and regular rhythm.     Pulses: Normal pulses.     Heart sounds: Normal heart sounds.  Pulmonary:     Effort: Pulmonary effort is normal.     Breath sounds: Normal breath sounds.  Abdominal:     General: Abdomen is flat.     Palpations: Abdomen is soft.  Musculoskeletal:        General: Normal range of motion.     Cervical back: Normal range of motion.     Right lower leg: No edema.     Left lower leg: No edema.  Skin:    General: Skin is warm and dry.     Capillary Refill: Capillary refill takes less than 2 seconds.  Neurological:     Mental Status: He is alert. Mental status is at baseline.     Arrived for home visit for [name] who was alert and oriented reporting he feels fine with no complaints. Vitals obtained and are as noted. Lung sounds clear. [name] reports he has been walking a lot and is getting short of breath but reports his breathing is worse when he is walking more and riding bike. No swelling noted. Medications were reviewed and confirmed. [name] noted to have missed several doses of his medications. Pill box filled the rest of the way accordingly. [name] started his Inspra today. Will have follow up labs in clinic next Thursday. I encouraged [name] to take his medicines. [name] was reminded of upcoming appointments. Home visit complete. I will see [name] in one week in clinic.   Refills:  NONE     Future Appointments  Date Time Provider  Department Center  10/05/2020  8:30 AM Claiborne Rigg, NP [facility] None  10/07/2020 10:30 AM [facility] LAB [facility] None  12/06/2020  8:30 AM [facility] PA/NP [facility] None  12/22/2020  7:25 AM [facility] DEVICE REMOTES [facility]  03/23/2021  7:25 AM [facility] DEVICE REMOTES [facility]  06/22/2021  7:25 AM [facility] DEVICE REMOTES [facility]  09/21/2021  7:25 AM [facility] DEVICE REMOTES [facility]  12/21/2021  7:25 AM [facility] DEVICE REMOTES [facility]     ACTION:  Home visit completed Next visit planned for one week

## 2020-09-29 ENCOUNTER — Other Ambulatory Visit (HOSPITAL_COMMUNITY): Payer: Medicaid Other

## 2020-10-05 ENCOUNTER — Telehealth (HOSPITAL_COMMUNITY): Payer: Self-pay | Admitting: Licensed Clinical Social Worker

## 2020-10-05 ENCOUNTER — Telehealth (HOSPITAL_COMMUNITY): Payer: Self-pay

## 2020-10-05 ENCOUNTER — Ambulatory Visit: Payer: Medicaid Other | Admitting: Nurse Practitioner

## 2020-10-05 ENCOUNTER — Other Ambulatory Visit (HOSPITAL_COMMUNITY): Payer: Self-pay

## 2020-10-05 NOTE — Telephone Encounter (Signed)
Pt was supposed to come to clinic appt this morning at [facility] but when contacted by [employer] he stated that transport never came.  CSW called [facility] medicaid transport who reports that driver stated address lead to a warehouse and when he attempted to call pt it went straight to VM and was unable to leave a message.  Pt reports he has been on his front porch the whole morning so driver must have gotten confused.  Paramedic already set up pt with new appt in November- CSW will assist with transport closer to the date.  CSW also confirmed transport details for lab appt with [facility] lab appt Thursday.  Was also able to update pt phone number with [facility] Medicaid transport as they had the wrong number in their chart.  Will continue to follow and assist as needed  Burna Sis, LCSW Clinical Social Worker [facility] Desk#: [phone] Cell#: [phone]

## 2020-10-05 NOTE — Telephone Encounter (Signed)
Spoke to English who reports he was at home at the time of scheduled clinic visit reporting no transportation ever showed up this morning. I called [facility] to advise them of same and we were able to reschedule clinic visit for 11/14. I will coordinate transportation with LCSW [name]. [name] agreed. I will see him in the home today. Call complete.

## 2020-10-05 NOTE — Progress Notes (Signed)
Paramedicine Encounter    Patient ID: [name], male    DOB: 1961-04-17, 59 y.o.   MRN: [id number]   Patient Care Team: Claiborne Rigg, NP as PCP - General (Nurse Practitioner) Marinus Maw, MD as PCP - Electrophysiology (Cardiology)  Patient Active Problem List   Diagnosis Date Noted  . Pneumonia due to COVID-19 virus 08/14/2020  . Hyponatremia 08/14/2020  . Elevated AST (SGOT) 08/14/2020  . Prolonged QT interval 08/14/2020  . Cardiac arrest, cause unspecified (HCC) 07/06/2020  . Left arm cellulitis 05/19/2020  . Essential hypertension 05/18/2020  . Mixed hyperlipidemia 05/18/2020  . Alcohol abuse 05/18/2020  . Cellulitis of left upper extremity 05/18/2020  . Lactic acidosis 05/18/2020  . 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 ICD (implantable cardioverter-defibrillator) in place 08/29/2019  . Chronic systolic heart failure (HCC) 08/25/2019  . 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 Coronary artery disease involving native coronary artery of native heart without angina pectoris 12/07/2017    Current Outpatient Medications:  .  albuterol (VENTOLIN HFA) 108 (90 Base) MCG/ACT inhaler, Inhale 2 puffs into the lungs every 6 (six) hours as needed for wheezing or shortness of breath., Disp: 6.7 g, Rfl: 0 .  atorvastatin (LIPITOR) 80 MG tablet, Take 1 tablet (80 mg total) by mouth daily at 6 PM., Disp: 90 tablet, Rfl: 3 .  carvedilol (COREG) 3.125 MG tablet, Take 1 tablet (3.125 mg total) by mouth 2 (two) times daily with a meal., Disp: 60 tablet, Rfl: 2 .  eplerenone (INSPRA) 25 MG tablet, Take 1 tablet (25 mg total) by mouth daily., Disp: 30 tablet, Rfl: 3 .  ezetimibe (ZETIA) 10 MG tablet, Take 1 tablet (10 mg total) by mouth daily., Disp: 30 tablet, Rfl: 6 .  ivabradine (CORLANOR) 5 MG TABS tablet, Take 1 tablet (5 mg total) by mouth 2 (two) times daily with a meal., Disp: 30 tablet, Rfl: 3 .   levETIRAcetam (KEPPRA) 500 MG tablet, Take 1 tablet (500 mg total) by mouth 2 (two) times daily., Disp: 180 tablet, Rfl: 3 .  sacubitril-valsartan (ENTRESTO) 24-26 MG, Take 1 tablet by mouth 2 (two) times daily., Disp: 60 tablet, Rfl: 6 No Known Allergies   Social History   Socioeconomic History  . Marital status: Divorced    Spouse name: Not on file  . Number of children: Not on file  . Years of education: Not on file  . Highest education level: Not on file  Occupational History  . Not on file  Tobacco Use  . Smoking status: Never Smoker  . Smokeless tobacco: Never Used  Vaping Use  . Vaping Use: Never used  Substance and Sexual Activity  . Alcohol use: Yes    Alcohol/week: 14.0 standard drinks    Types: 14 Cans of beer per week    Comment: patient endorses at least 1-2 beers daily (04/2020)  . Drug use: No  . Sexual activity: Not Currently  Other Topics Concern  . Not on file  Social History Narrative   Lives at home with his brother who is crippled. He takes care of his brother.    Right handed    Social Determinants of Health   Financial Resource Strain: High Risk  . Difficulty of Paying Living Expenses: Hard  Food Insecurity: No Food Insecurity  . Worried About Programme researcher, broadcasting/film/video in the Last Year: Never true  . Ran Out of Food in the Last  Year: Never true  Transportation Needs: No Transportation Needs  . Lack of Transportation (Medical): No  . Lack of Transportation (Non-Medical): No  Physical Activity:   . Days of Exercise per Week: Not on file  . Minutes of Exercise per Session: Not on file  Stress:   . Feeling of Stress : Not on file  Social Connections:   . Frequency of Communication with Friends and Family: Not on file  . Frequency of Social Gatherings with Friends and Family: Not on file  . Attends Religious Services: Not on file  . Active Member of Clubs or Organizations: Not on file  . Attends Banker Meetings: Not on file  . Marital  Status: Not on file  Intimate Partner Violence:   . Fear of Current or Ex-Partner: Not on file  . Emotionally Abused: Not on file  . Physically Abused: Not on file  . Sexually Abused: Not on file    Physical Exam Vitals reviewed.  Constitutional:      Appearance: He is normal weight.  HENT:     Head: Normocephalic.     Nose: Nose normal.     Mouth/Throat:     Mouth: Mucous membranes are moist.     Pharynx: Oropharynx is clear.  Eyes:     Pupils: Pupils are equal, round, and reactive to light.  Cardiovascular:     Rate and Rhythm: Normal rate and regular rhythm.     Pulses: Normal pulses.     Heart sounds: Normal heart sounds.  Pulmonary:     Effort: Pulmonary effort is normal.     Breath sounds: Normal breath sounds.  Abdominal:     General: Abdomen is flat.     Palpations: Abdomen is soft.  Musculoskeletal:        General: Normal range of motion.     Cervical back: Normal range of motion.     Right lower leg: No edema.     Left lower leg: No edema.  Skin:    General: Skin is warm and dry.     Capillary Refill: Capillary refill takes less than 2 seconds.  Neurological:     Mental Status: He is alert. Mental status is at baseline.  Psychiatric:        Mood and Affect: Mood normal.    [name] contacted for clinic visit at [facility] today and he reports no one showed up for his transportation today and I contacted [facility] and rescheduled same. I will follow up with transportation needs.    Arrived for home visit for [name] today who was seated on the porch alert and oriented. [name] denied any complaints on assessment. No swelling noted. Lung sounds clear. Vitals were obtained and are as noted. Medications reviewed and pill box filled accordingly. [name] was made aware of upcoming appointment on Thursday and transportation will be by to pick 0915. [name] agreed and understood same.    [name] also was notified that he was subpoenaed for court for making complaint of trespassers.  [name] failed to appear for court date for today, he says he will contact [facility] about same and I will follow up for his 11/9 at 0930.    Home visit complete. I will see him in one week.   Refills NONE    Future Appointments  Date Time Provider Department Center  10/07/2020 10:30 AM [facility] LAB [facility] None  11/10/2020  2:10 PM Claiborne Rigg, NP [facility] None  12/06/2020  8:30 AM [facility] PA/NP [facility]  None  12/22/2020  7:25 AM [facility] DEVICE REMOTES [facility]  03/23/2021  7:25 AM [facility] DEVICE REMOTES [facility]  06/22/2021  7:25 AM [facility] DEVICE REMOTES [facility]  09/21/2021  7:25 AM [facility] DEVICE REMOTES [facility]  12/21/2021  7:25 AM [facility] DEVICE REMOTES [facility]     ACTION: Home visit completed Next visit planned for one week

## 2020-10-07 ENCOUNTER — Other Ambulatory Visit: Payer: Self-pay

## 2020-10-07 ENCOUNTER — Telehealth (HOSPITAL_COMMUNITY): Payer: Self-pay

## 2020-10-07 ENCOUNTER — Ambulatory Visit (HOSPITAL_COMMUNITY)
Admission: RE | Admit: 2020-10-07 | Discharge: 2020-10-07 | Disposition: A | Discharge status: Home / Self Care | Payer: Medicaid Other | Source: Ambulatory Visit | Attending: Internal Medicine | Admitting: Internal Medicine

## 2020-10-07 DIAGNOSIS — I5022 Chronic systolic (congestive) heart failure: Secondary | ICD-10-CM | POA: Insufficient documentation

## 2020-10-07 LAB — BASIC METABOLIC PANEL
Anion gap: 7 (ref 5–15)
BUN: 8 mg/dL (ref 6–20)
CO2: 28 mmol/L (ref 22–32)
Calcium: 9.5 mg/dL (ref 8.9–10.3)
Chloride: 105 mmol/L (ref 98–111)
Creatinine, Ser: 0.95 mg/dL (ref 0.61–1.24)
GFR, Estimated: >60 mL/min (ref >60)
Glucose, Bld: 98 mg/dL (ref 70–99)
Potassium: 4.9 mmol/L (ref 3.5–5.1)
Sodium: 140 mmol/L (ref 135–145)

## 2020-10-07 NOTE — Telephone Encounter (Signed)
Spoke to [place] who verbalized he will be in for lab visit today. He is awaiting his transportation as of 0900. Call complete.

## 2020-10-12 ENCOUNTER — Other Ambulatory Visit (HOSPITAL_COMMUNITY): Payer: Self-pay

## 2020-10-12 MED FILL — EZETIMIBE 10 MG TABS: 10 | 30 days supply | Qty: 30 | Fill #4

## 2020-10-12 NOTE — Progress Notes (Signed)
Paramedicine Encounter    Patient ID: [name], male    DOB: 1961-10-13, 59 y.o.   MRN: [id number]   Patient Care Team: Claiborne Rigg, NP as PCP - General (Nurse Practitioner) Marinus Maw, MD as PCP - Electrophysiology (Cardiology)  Patient Active Problem List   Diagnosis Date Noted   Pneumonia due to COVID-19 virus 08/14/2020   Hyponatremia 08/14/2020   Elevated AST (SGOT) 08/14/2020   Prolonged QT interval 08/14/2020   Cardiac arrest, cause unspecified (HCC) 07/06/2020   Left arm cellulitis 05/19/2020   Essential hypertension 05/18/2020   Mixed hyperlipidemia 05/18/2020   Alcohol abuse 05/18/2020   Cellulitis of left upper extremity 05/18/2020   Lactic acidosis 05/18/2020   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, 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) 12/30/2019   ICD (implantable cardioverter-defibrillator) in place 08/29/2019   Chronic systolic heart failure (HCC) 08/25/2019   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/12/2017   Coronary artery disease involving native coronary artery of native heart without angina pectoris 12/07/2017    Current Outpatient Medications:    albuterol (VENTOLIN HFA) 108 (90 Base) MCG/ACT inhaler, Inhale 2 puffs into the lungs every 6 (six) hours as needed for wheezing or shortness of breath., Disp: 6.7 g, Rfl: 0   atorvastatin (LIPITOR) 80 MG tablet, Take 1 tablet (80 mg total) by mouth daily at 6 PM., Disp: 90 tablet, Rfl: 3   carvedilol (COREG) 3.125 MG tablet, Take 1 tablet (3.125 mg total) by mouth 2 (two) times daily with a meal., Disp: 60 tablet, Rfl: 2   eplerenone (INSPRA) 25 MG tablet, Take 1 tablet (25 mg total) by mouth daily., Disp: 30 tablet, Rfl: 3   ezetimibe (ZETIA) 10 MG tablet, Take 1 tablet (10 mg total) by mouth daily., Disp: 30 tablet, Rfl: 6   ivabradine (CORLANOR) 5 MG TABS tablet, Take 1 tablet (5 mg total) by mouth 2 (two) times daily with a meal., Disp: 30 tablet, Rfl: 3    levETIRAcetam (KEPPRA) 500 MG tablet, Take 1 tablet (500 mg total) by mouth 2 (two) times daily., Disp: 180 tablet, Rfl: 3   sacubitril-valsartan (ENTRESTO) 24-26 MG, Take 1 tablet by mouth 2 (two) times daily., Disp: 60 tablet, Rfl: 6 No Known Allergies   Social History   Socioeconomic History   Marital status: Divorced    Spouse name: Not on file   Number of children: Not on file   Years of education: Not on file   Highest education level: Not on file  Occupational History   Not on file  Tobacco Use   Smoking status: Never Smoker   Smokeless tobacco: Never Used  Vaping Use   Vaping Use: Never used  Substance and Sexual Activity   Alcohol use: Yes    Alcohol/week: 14.0 standard drinks    Types: 14 Cans of beer per week    Comment: patient endorses at least 1-2 beers daily (04/2020)   Drug use: No   Sexual activity: Not Currently  Other Topics Concern   Not on file  Social History Narrative   Lives at home with his brother who is crippled. He takes care of his brother.    Right handed    Social Determinants of Health   Financial Resource Strain: High Risk   Difficulty of Paying Living Expenses: Hard  Food Insecurity: No Food Insecurity   Worried About Running Out of Food in the Last Year: Never true   Ran Out of Food in the Last  Year: Never true  Transportation Needs: No Transportation Needs   Lack of Transportation (Medical): No   Lack of Transportation (Non-Medical): No  Physical Activity:    Days of Exercise per Week: Not on file   Minutes of Exercise per Session: Not on file  Stress:    Feeling of Stress : Not on file  Social Connections:    Frequency of Communication with Friends and Family: Not on file   Frequency of Social Gatherings with Friends and Family: Not on file   Attends Religious Services: Not on file   Active Member of Clubs or Organizations: Not on file   Attends Banker Meetings: Not on file   Marital  Status: Not on file  Intimate Partner Violence:    Fear of Current or Ex-Partner: Not on file   Emotionally Abused: Not on file   Physically Abused: Not on file   Sexually Abused: Not on file    Physical Exam Vitals reviewed.  Constitutional:      Appearance: He is normal weight.  HENT:     Head: Normocephalic.     Nose: Nose normal.     Mouth/Throat:     Mouth: Mucous membranes are moist.     Pharynx: Oropharynx is clear.  Eyes:     Pupils: Pupils are equal, round, and reactive to light.  Cardiovascular:     Rate and Rhythm: Normal rate and regular rhythm.     Pulses: Normal pulses.     Heart sounds: Normal heart sounds.  Pulmonary:     Effort: Pulmonary effort is normal.     Breath sounds: Normal breath sounds.  Abdominal:     General: Abdomen is flat.     Palpations: Abdomen is soft.  Musculoskeletal:        General: Normal range of motion.     Cervical back: Normal range of motion.     Right lower leg: No edema.     Left lower leg: No edema.  Skin:    General: Skin is warm and dry.     Capillary Refill: Capillary refill takes less than 2 seconds.  Neurological:     General: No focal deficit present.     Mental Status: He is alert. Mental status is at baseline.  Psychiatric:        Mood and Affect: Mood normal.    Arrived for home visit for [facility] who was alert and oriented ambulating around on his porch reporting he feels fine. Georgios was compliant with medications except two doses over the last week. Vitals were obtained. Medications were confirmed and pill box was filled accordingly. Noland has no appointments upcoming. Bertel was informed to be sure to continue taking medications as prescribed and he verbalized agreement. Home visit complete. I will see Adan in one week.   Refills: Cheryln Manly      Future Appointments  Date Time Provider Department Center  11/10/2020  2:10 PM Claiborne Rigg, NP [facility] None  12/06/2020  8:30 AM [facility]  PA/NP [facility] None  12/22/2020  7:25 AM [facility] DEVICE REMOTES [facility]  03/23/2021  7:25 AM [facility] DEVICE REMOTES [facility]  06/22/2021  7:25 AM [facility] DEVICE REMOTES [facility]  09/21/2021  7:25 AM [facility]ICE [facility]ES [facility]  12/21/2021  7:25 AM [facility] DEVICE [facility]OTES [facility]     ACTION: Home visit completed

## 2020-10-13 MED FILL — ENTRESTO 24 MG-26 MG TABLET: 24-26 | 14 days supply | Qty: 28 | Fill #3

## 2020-10-19 ENCOUNTER — Other Ambulatory Visit (HOSPITAL_COMMUNITY): Payer: Self-pay
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
## 2020-10-20 MED FILL — EPLERENONE 25 MG TABS: 25 | 30 days supply | Qty: 30 | Fill #1

## 2020-10-26 ENCOUNTER — Other Ambulatory Visit (HOSPITAL_COMMUNITY): Payer: Self-pay

## 2020-10-26 NOTE — Progress Notes (Signed)
Paramedicine Encounter    Patient ID: [name], male    DOB: 1961-04-17, 59 y.o.   MRN: [id number]   Patient Care Team: Jose Rigg, NP as PCP - General (Nurse Practitioner) Jose Maw, MD as PCP - Electrophysiology (Cardiology)  Patient Active Problem List   Diagnosis Date Noted  . Pneumonia due to COVID-19 virus 08/14/2020  . Hyponatremia 08/14/2020  . Elevated AST (SGOT) 08/14/2020  . Prolonged QT interval 08/14/2020  . Cardiac arrest, cause unspecified (HCC) 07/06/2020  . Left arm cellulitis 05/19/2020  . Essential hypertension 05/18/2020  . Mixed hyperlipidemia 05/18/2020  . Alcohol abuse 05/18/2020  . Cellulitis of left upper extremity 05/18/2020  . Lactic acidosis 05/18/2020  . 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 ICD (implantable cardioverter-defibrillator) in place 08/29/2019  . Chronic systolic heart failure (HCC) 08/25/2019  . 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 Coronary artery disease involving native coronary artery of native heart without angina pectoris 12/07/2017    Current Outpatient Medications:  .  albuterol (VENTOLIN HFA) 108 (90 Base) MCG/ACT inhaler, Inhale 2 puffs into the lungs every 6 (six) hours as needed for wheezing or shortness of breath., Disp: 6.7 g, Rfl: 0 .  atorvastatin (LIPITOR) 80 MG tablet, Take 1 tablet (80 mg total) by mouth daily at 6 PM., Disp: 90 tablet, Rfl: 3 .  carvedilol (COREG) 3.125 MG tablet, Take 1 tablet (3.125 mg total) by mouth 2 (two) times daily with a meal., Disp: 60 tablet, Rfl: 2 .  eplerenone (INSPRA) 25 MG tablet, Take 1 tablet (25 mg total) by mouth daily., Disp: 30 tablet, Rfl: 3 .  ezetimibe (ZETIA) 10 MG tablet, Take 1 tablet (10 mg total) by mouth daily., Disp: 30 tablet, Rfl: 6 .  ivabradine (CORLANOR) 5 MG TABS tablet, Take 1 tablet (5 mg total) by mouth 2 (two) times daily with a meal., Disp: 30 tablet, Rfl: 3 .   levETIRAcetam (KEPPRA) 500 MG tablet, Take 1 tablet (500 mg total) by mouth 2 (two) times daily., Disp: 180 tablet, Rfl: 3 .  sacubitril-valsartan (ENTRESTO) 24-26 MG, Take 1 tablet by mouth 2 (two) times daily., Disp: 60 tablet, Rfl: 6 No Known Allergies   Social History   Socioeconomic History  . Marital status: Divorced    Spouse name: Not on file  . Number of children: Not on file  . Years of education: Not on file  . Highest education level: Not on file  Occupational History  . Not on file  Tobacco Use  . Smoking status: Never Smoker  . Smokeless tobacco: Never Used  Vaping Use  . Vaping Use: Never used  Substance and Sexual Activity  . Alcohol use: Yes    Alcohol/week: 14.0 standard drinks    Types: 14 Cans of beer per week    Comment: patient endorses at least 1-2 beers daily (04/2020)  . Drug use: No  . Sexual activity: Not Currently  Other Topics Concern  . Not on file  Social History Narrative   Lives at home with his brother who is crippled. He takes care of his brother.    Right handed    Social Determinants of Health   Financial Resource Strain: High Risk  . Difficulty of Paying Living Expenses: Hard  Food Insecurity: No Food Insecurity  . Worried About Programme researcher, broadcasting/film/video in the Last Year: Never true  . Ran Out of Food in the Last  Year: Never true  Transportation Needs: No Transportation Needs  . Lack of Transportation (Medical): No  . Lack of Transportation (Non-Medical): No  Physical Activity:   . Days of Exercise per Week: Not on file  . Minutes of Exercise per Session: Not on file  Stress:   . Feeling of Stress : Not on file  Social Connections:   . Frequency of Communication with Friends and Family: Not on file  . Frequency of Social Gatherings with Friends and Family: Not on file  . Attends Religious Services: Not on file  . Active Member of Clubs or Organizations: Not on file  . Attends Banker Meetings: Not on file  . Marital  Status: Not on file  Intimate Partner Violence:   . Fear of Current or Ex-Partner: Not on file  . Emotionally Abused: Not on file  . Physically Abused: Not on file  . Sexually Abused: Not on file    Physical Exam Vitals reviewed.  Constitutional:      Appearance: He is normal weight.  HENT:     Head: Normocephalic.     Nose: Nose normal.     Mouth/Throat:     Mouth: Mucous membranes are moist.     Pharynx: Oropharynx is clear.  Eyes:     Pupils: Pupils are equal, round, and reactive to light.  Cardiovascular:     Rate and Rhythm: Normal rate and regular rhythm.     Pulses: Normal pulses.     Heart sounds: Normal heart sounds.  Pulmonary:     Effort: Pulmonary effort is normal.     Breath sounds: Normal breath sounds.  Abdominal:     General: Abdomen is flat.     Palpations: Abdomen is soft.  Musculoskeletal:        General: Normal range of motion.     Right lower leg: No edema.     Left lower leg: No edema.  Skin:    General: Skin is warm and dry.     Capillary Refill: Capillary refill takes less than 2 seconds.  Neurological:     General: No focal deficit present.     Mental Status: He is alert. Mental status is at baseline.  Psychiatric:        Mood and Affect: Mood normal.     Arrived for home visit for [name] who was alert and oriented seated on the porch reporting he was feeling okay other than having some pain in his hands and feet which he thinks is from the cold temperatures. [name] reports no heat in their home. I will be looking into heating options for him. Vitals were obtained and are as noted in report. Medications were verified and pill box filled accordingly. [name] agreed to take his pills from the bottles next week during my absence. I reviewed this with [name] and he understood. [name] got his phone back and it is working with same number. [name] also reports needing assistance with heating his home as well as paying for his water bill. I spoke to [place]  LCSW who reports we can assist with space heaters through patient care fund and blankets with donations as well. I will be working on this. Home visit complete. I will see patient in two weeks.   Refills: NONE     Future Appointments  Date Time Provider Department Center  11/10/2020  2:10 PM Jose Rigg, NP [facility] None  12/06/2020  8:30 AM [facility] PA/NP [facility] None  12/22/2020  7:25 AM [facility] DEVICE REMOTES [facility]  03/23/2021  7:25 AM [facility] DEVICE REMOTES [facility]  06/22/2021  7:25 AM [facility] DEVICE REMOTES [facility]  09/21/2021  7:25 AM [facility] DEVICE REMOTES [facility]  12/21/2021  7:25 AM [facility] DEVICE REMOTES [facility]     ACTION: Home visit completed Next visit planned for one week

## 2020-11-02 ENCOUNTER — Telehealth (HOSPITAL_COMMUNITY): Payer: Self-pay | Admitting: Licensed Clinical Social Worker

## 2020-11-02 NOTE — Telephone Encounter (Signed)
CSW received call from pt that he is still struggling getting money for water bill.  CSW called pt landlord to inquire if he could get money back that he paid for rent as he has a grant that can help him with rent- unable to return funds at this time as it was already deposited.  Landlord did refer patient to go to [employer] which is a church based program that helps with bills, medication costs, etc for local residents.  CSW reminded pt of this and need to go there in person with a copy of his bill.  He will plan to pick up a bill tomorrow from [facility] and then go to [employer] to see how much they can assist him with.  CSW will continue to follow and assist as needed  Burna Sis, LCSW Clinical Social Worker [facility] Desk#: [phone] Cell#: [phone]

## 2020-11-05 ENCOUNTER — Telehealth (HOSPITAL_COMMUNITY): Payer: Self-pay | Admitting: Licensed Clinical Social Worker

## 2020-11-05 NOTE — Telephone Encounter (Signed)
CSW made Medicaid transport reservation for pt to get to [facility] appt on 11/17  Pick up from home between 12:55-1:25pm for pt arrival for 2:10appt.  Pt scheduled for pick up at 3:10pm from [facility].  CSW attempted to call pt to inform of details- unable to reach.  Pt also brought food by [employer] yesterday to help hold him over till he gets his food stamps on 11/15.  Will continue to follow and assist as needed  Burna Sis, LCSW Clinical Social Worker [facility] Desk#: [phone] Cell#: [phone]

## 2020-11-09 ENCOUNTER — Other Ambulatory Visit (HOSPITAL_COMMUNITY): Payer: Self-pay

## 2020-11-09 MED FILL — CARVEDILOL 3.125 MG TABLET: 3.125 | 30 days supply | Qty: 60 | Fill #2

## 2020-11-09 MED FILL — ENTRESTO 24 MG-26 MG TABLET: 24-26 | 30 days supply | Qty: 60 | Fill #4

## 2020-11-09 NOTE — Progress Notes (Signed)
Paramedicine Encounter    Patient ID: [name], male    DOB: 1961-04-17, 59 y.o.   MRN: [id number]   Patient Care Team: Jose Rigg, NP as PCP - General (Nurse Practitioner) Jose Maw, MD as PCP - Electrophysiology (Cardiology)  Patient Active Problem List   Diagnosis Date Noted  . Pneumonia due to COVID-19 virus 08/14/2020  . Hyponatremia 08/14/2020  . Elevated AST (SGOT) 08/14/2020  . Prolonged QT interval 08/14/2020  . Cardiac arrest, cause unspecified (HCC) 07/06/2020  . Left arm cellulitis 05/19/2020  . Essential hypertension 05/18/2020  . Mixed hyperlipidemia 05/18/2020  . Alcohol abuse 05/18/2020  . Cellulitis of left upper extremity 05/18/2020  . Lactic acidosis 05/18/2020  . 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 ICD (implantable cardioverter-defibrillator) in place 08/29/2019  . Chronic systolic heart failure (HCC) 08/25/2019  . 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 Coronary artery disease involving native coronary artery of native heart without angina pectoris 12/07/2017    Current Outpatient Medications:  .  albuterol (VENTOLIN HFA) 108 (90 Base) MCG/ACT inhaler, Inhale 2 puffs into the lungs every 6 (six) hours as needed for wheezing or shortness of breath., Disp: 6.7 g, Rfl: 0 .  atorvastatin (LIPITOR) 80 MG tablet, Take 1 tablet (80 mg total) by mouth daily at 6 PM., Disp: 90 tablet, Rfl: 3 .  carvedilol (COREG) 3.125 MG tablet, Take 1 tablet (3.125 mg total) by mouth 2 (two) times daily with a meal., Disp: 60 tablet, Rfl: 2 .  eplerenone (INSPRA) 25 MG tablet, Take 1 tablet (25 mg total) by mouth daily., Disp: 30 tablet, Rfl: 3 .  ezetimibe (ZETIA) 10 MG tablet, Take 1 tablet (10 mg total) by mouth daily., Disp: 30 tablet, Rfl: 6 .  ivabradine (CORLANOR) 5 MG TABS tablet, Take 1 tablet (5 mg total) by mouth 2 (two) times daily with a meal., Disp: 30 tablet, Rfl: 3 .   levETIRAcetam (KEPPRA) 500 MG tablet, Take 1 tablet (500 mg total) by mouth 2 (two) times daily., Disp: 180 tablet, Rfl: 3 .  sacubitril-valsartan (ENTRESTO) 24-26 MG, Take 1 tablet by mouth 2 (two) times daily., Disp: 60 tablet, Rfl: 6 No Known Allergies   Social History   Socioeconomic History  . Marital status: Divorced    Spouse name: Not on file  . Number of children: Not on file  . Years of education: Not on file  . Highest education level: Not on file  Occupational History  . Not on file  Tobacco Use  . Smoking status: Never Smoker  . Smokeless tobacco: Never Used  Vaping Use  . Vaping Use: Never used  Substance and Sexual Activity  . Alcohol use: Yes    Alcohol/week: 14.0 standard drinks    Types: 14 Cans of beer per week    Comment: patient endorses at least 1-2 beers daily (04/2020)  . Drug use: No  . Sexual activity: Not Currently  Other Topics Concern  . Not on file  Social History Narrative   Lives at home with his brother who is crippled. He takes care of his brother.    Right handed    Social Determinants of Health   Financial Resource Strain: High Risk  . Difficulty of Paying Living Expenses: Hard  Food Insecurity: No Food Insecurity  . Worried About Programme researcher, broadcasting/film/video in the Last Year: Never true  . Ran Out of Food in the Last  Year: Never true  Transportation Needs: No Transportation Needs  . Lack of Transportation (Medical): No  . Lack of Transportation (Non-Medical): No  Physical Activity:   . Days of Exercise per Week: Not on file  . Minutes of Exercise per Session: Not on file  Stress:   . Feeling of Stress : Not on file  Social Connections:   . Frequency of Communication with Friends and Family: Not on file  . Frequency of Social Gatherings with Friends and Family: Not on file  . Attends Religious Services: Not on file  . Active Member of Clubs or Organizations: Not on file  . Attends Banker Meetings: Not on file  . Marital  Status: Not on file  Intimate Partner Violence:   . Fear of Current or Ex-Partner: Not on file  . Emotionally Abused: Not on file  . Physically Abused: Not on file  . Sexually Abused: Not on file    Physical Exam Vitals reviewed.  Constitutional:      Appearance: He is normal weight.  HENT:     Head: Normocephalic.     Nose: Nose normal.     Mouth/Throat:     Mouth: Mucous membranes are moist.     Pharynx: Oropharynx is clear.  Eyes:     Pupils: Pupils are equal, round, and reactive to light.  Cardiovascular:     Rate and Rhythm: Normal rate and regular rhythm.     Pulses: Normal pulses.     Heart sounds: Normal heart sounds.  Pulmonary:     Effort: Pulmonary effort is normal.     Breath sounds: Normal breath sounds.  Abdominal:     General: Abdomen is flat.     Palpations: Abdomen is soft.  Musculoskeletal:        General: Normal range of motion.     Cervical back: Normal range of motion.     Right lower leg: No edema.     Left lower leg: No edema.  Skin:    General: Skin is warm and dry.     Capillary Refill: Capillary refill takes less than 2 seconds.  Neurological:     General: No focal deficit present.     Mental Status: He is alert. Mental status is at baseline.  Psychiatric:        Mood and Affect: Mood normal.     Arrived for home visit for [name] who was alert and oriented ambulating around on his porch on arrival reporting he was feeling pretty good today with no complaints. [name] said he was compliant with medications over the last two weeks. Pill box was empty on assessment. No swelling or edema noted. Chance complaints of general arthritic pain but no complaints of dizziness or chest pain. Vitals were obtained and are as noted. Medications were confirmed and pill box was filled accordingly. I reminded [name] of his appointment tomorrow with PCP, he agreed and was given times of transportation pick up. [name] reports he got a new phone but needs to get  minutes placed on it before he can make calls. He reports he will do this today. I will follow up. [name] reports he has no heat in the home, I am still in search of some space heaters for him as well as some blankets. I will see [name] in one week. Home visit complete.   Refills: -Carvedilol [name]     Future Appointments  Date Time Provider Department Center  11/10/2020  2:10 PM Bertram Denver  W, NP [facility] None  12/06/2020  8:30 AM [facility] PA/NP [facility] None  12/22/2020  7:25 AM [facility] DEVICE REMOTES [facility]  03/23/2021  7:25 AM [facility] DEVICE REMOTES [facility]  06/22/2021  7:25 AM [facility] DEVICE REMOTES [facility]  09/21/2021  7:25 AM [facility] DEVICE REMOTES [facility]  12/21/2021  7:25 AM [facility] DEVICE REMOTES [facility]     ACTION: Home visit completed Next visit planned for one week

## 2020-11-10 ENCOUNTER — Ambulatory Visit: Payer: Medicaid Other | Attending: Nurse Practitioner | Admitting: Nurse Practitioner

## 2020-11-10 ENCOUNTER — Encounter: Payer: Self-pay | Admitting: Nurse Practitioner

## 2020-11-10 NOTE — Progress Notes (Signed)
[name] was unable to come in to the office today for his appointment. When I spoke to him on the phone he stated "I'm not doing too good and we need to make this phone call quick cause I've got an emergency to tend to". He requested to be rescheduled as I am unable to offer him a televisit in the short amount of time that he requests.

## 2020-11-16 ENCOUNTER — Other Ambulatory Visit (HOSPITAL_COMMUNITY): Payer: Self-pay

## 2020-11-16 ENCOUNTER — Telehealth (HOSPITAL_COMMUNITY): Payer: Self-pay | Admitting: Licensed Clinical Social Worker

## 2020-11-16 NOTE — Progress Notes (Signed)
Paramedicine Encounter    Patient ID: [name], male    DOB: 1961-04-17, 59 y.o.   MRN: [id number]   Patient Care Team: Jose Rigg, NP as PCP - General (Nurse Practitioner) Jose Maw, MD as PCP - Electrophysiology (Cardiology)  Patient Active Problem List   Diagnosis Date Noted  . Pneumonia due to COVID-19 virus 08/14/2020  . Hyponatremia 08/14/2020  . Elevated AST (SGOT) 08/14/2020  . Prolonged QT interval 08/14/2020  . Cardiac arrest, cause unspecified (HCC) 07/06/2020  . Left arm cellulitis 05/19/2020  . Essential hypertension 05/18/2020  . Mixed hyperlipidemia 05/18/2020  . Alcohol abuse 05/18/2020  . Cellulitis of left upper extremity 05/18/2020  . Lactic acidosis 05/18/2020  . 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 ICD (implantable cardioverter-defibrillator) in place 08/29/2019  . Chronic systolic heart failure (HCC) 08/25/2019  . 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 Coronary artery disease involving native coronary artery of native heart without angina pectoris 12/07/2017    Current Outpatient Medications:  .  albuterol (VENTOLIN HFA) 108 (90 Base) MCG/ACT inhaler, Inhale 2 puffs into the lungs every 6 (six) hours as needed for wheezing or shortness of breath., Disp: 6.7 g, Rfl: 0 .  atorvastatin (LIPITOR) 80 MG tablet, Take 1 tablet (80 mg total) by mouth daily at 6 PM., Disp: 90 tablet, Rfl: 3 .  carvedilol (COREG) 3.125 MG tablet, Take 1 tablet (3.125 mg total) by mouth 2 (two) times daily with a meal., Disp: 60 tablet, Rfl: 2 .  eplerenone (INSPRA) 25 MG tablet, Take 1 tablet (25 mg total) by mouth daily., Disp: 30 tablet, Rfl: 3 .  ezetimibe (ZETIA) 10 MG tablet, Take 1 tablet (10 mg total) by mouth daily., Disp: 30 tablet, Rfl: 6 .  ivabradine (CORLANOR) 5 MG TABS tablet, Take 1 tablet (5 mg total) by mouth 2 (two) times daily with a meal., Disp: 30 tablet, Rfl: 3 .   levETIRAcetam (KEPPRA) 500 MG tablet, Take 1 tablet (500 mg total) by mouth 2 (two) times daily., Disp: 180 tablet, Rfl: 3 .  sacubitril-valsartan (ENTRESTO) 24-26 MG, Take 1 tablet by mouth 2 (two) times daily., Disp: 60 tablet, Rfl: 6 No Known Allergies   Social History   Socioeconomic History  . Marital status: Divorced    Spouse name: Not on file  . Number of children: Not on file  . Years of education: Not on file  . Highest education level: Not on file  Occupational History  . Not on file  Tobacco Use  . Smoking status: Never Smoker  . Smokeless tobacco: Never Used  Vaping Use  . Vaping Use: Never used  Substance and Sexual Activity  . Alcohol use: Yes    Alcohol/week: 14.0 standard drinks    Types: 14 Cans of beer per week    Comment: patient endorses at least 1-2 beers daily (04/2020)  . Drug use: No  . Sexual activity: Not Currently  Other Topics Concern  . Not on file  Social History Narrative   Lives at home with his brother who is crippled. He takes care of his brother.    Right handed    Social Determinants of Health   Financial Resource Strain: High Risk  . Difficulty of Paying Living Expenses: Hard  Food Insecurity: No Food Insecurity  . Worried About Programme researcher, broadcasting/film/video in the Last Year: Never true  . Ran Out of Food in the Last  Year: Never true  Transportation Needs: No Transportation Needs  . Lack of Transportation (Medical): No  . Lack of Transportation (Non-Medical): No  Physical Activity:   . Days of Exercise per Week: Not on file  . Minutes of Exercise per Session: Not on file  Stress:   . Feeling of Stress : Not on file  Social Connections:   . Frequency of Communication with Friends and Family: Not on file  . Frequency of Social Gatherings with Friends and Family: Not on file  . Attends Religious Services: Not on file  . Active Member of Clubs or Organizations: Not on file  . Attends Banker Meetings: Not on file  . Marital  Status: Not on file  Intimate Partner Violence:   . Fear of Current or Ex-Partner: Not on file  . Emotionally Abused: Not on file  . Physically Abused: Not on file  . Sexually Abused: Not on file    Physical Exam Constitutional:      Appearance: He is normal weight.  HENT:     Head: Normocephalic.     Nose: Nose normal.     Mouth/Throat:     Mouth: Mucous membranes are moist.     Pharynx: Oropharynx is clear.  Eyes:     Pupils: Pupils are equal, round, and reactive to light.  Cardiovascular:     Rate and Rhythm: Normal rate and regular rhythm.     Pulses: Normal pulses.     Heart sounds: Normal heart sounds.  Pulmonary:     Effort: Pulmonary effort is normal.     Breath sounds: Normal breath sounds.  Abdominal:     General: Abdomen is flat.     Palpations: Abdomen is soft.  Musculoskeletal:        General: Normal range of motion.     Cervical back: Normal range of motion.     Right lower leg: No edema.     Left lower leg: No edema.     Comments: Bilateral hand arthritis.   Skin:    General: Skin is warm and dry.     Capillary Refill: Capillary refill takes less than 2 seconds.  Neurological:     General: No focal deficit present.     Mental Status: He is alert. Mental status is at baseline.  Psychiatric:        Mood and Affect: Mood normal.      Arrived for home visit for [name] who was alert and oriented reporting he was feeling fine with no reports of shortness of breath, dizziness, chest pain or other priority symptoms. [name] only complained of bilateral hand pain from chronic arthritis. [name] was instructed to take OTC Tylenol. Vitals were obtained. Medications reviewed and pill box filled accordingly. [name] has [facility] appt. Tomorrow, Eileen Stanford set up transportation. [name] agreed and understood to be ready by 0800. [name] needs new batteries for scale. I will obtain same. Home visit complete. I will see [name] in one week.   Refills: NONE    Future Appointments   Date Time Provider Department Center  11/17/2020 10:00 AM [facility] LAB [facility] None  12/06/2020  8:30 AM [facility] PA/NP [facility] None  12/22/2020  7:25 AM [facility] DEVICE REMOTES [facility]  01/12/2021 10:10 AM Jose Rigg, NP [facility] None  03/23/2021  7:25 AM [facility] DEVICE REMOTES [facility]  06/22/2021  7:25 AM [facility] DEVICE REMOTES [facility]  09/21/2021  7:25 AM [facility] DEVICE REMOTES [facility]  12/21/2021  7:25 AM  [facility] DEVICE REMOTES [facility]     ACTION: Home visit completed Next visit planned for one week

## 2020-11-16 NOTE — Telephone Encounter (Signed)
Community Paramedic called CSW to request help with transport to [facility] lab appt tomorrow.  CSW called [facility] to request ride- they are unable to provide pick up time currently and patient does not have working phone so CSW informed pt to be looking outside starting at 8:30am.  Will continue to follow and assist as needed  Burna Sis, LCSW Clinical Social Worker [facility] Desk#: [phone] Cell#: [phone]

## 2020-11-17 ENCOUNTER — Other Ambulatory Visit: Payer: Self-pay | Admitting: Nurse Practitioner

## 2020-11-17 ENCOUNTER — Other Ambulatory Visit: Payer: Self-pay

## 2020-11-17 ENCOUNTER — Ambulatory Visit: Payer: Medicaid Other | Attending: Nurse Practitioner

## 2020-11-17 DIAGNOSIS — Z5181 Encounter for therapeutic drug level monitoring: Secondary | ICD-10-CM

## 2020-11-17 DIAGNOSIS — D72829 Elevated white blood cell count, unspecified: Secondary | ICD-10-CM | POA: Diagnosis not present

## 2020-11-22 ENCOUNTER — Telehealth (HOSPITAL_COMMUNITY): Payer: Self-pay | Admitting: Licensed Clinical Social Worker

## 2020-11-22 LAB — CBC WITH DIFFERENTIAL/PLATELET
Basophils Absolute: 0.1 10*3/uL (ref 0.0–0.2)
Basos: 1 %
EOS (ABSOLUTE): 0.3 10*3/uL (ref 0.0–0.4)
Eos: 3 %
Hematocrit: 40.1 % (ref 37.5–51.0)
Hemoglobin: 14.1 g/dL (ref 13.0–17.7)
Immature Grans (Abs): 0 10*3/uL (ref 0.0–0.1)
Immature Granulocytes: 0 %
Lymphocytes Absolute: 2.4 10*3/uL (ref 0.7–3.1)
Lymphs: 28 %
MCH: 32.8 pg (ref 26.6–33.0)
MCHC: 35.2 g/dL (ref 31.5–35.7)
MCV: 93 fL (ref 79–97)
Monocytes Absolute: 1 10*3/uL — ABNORMAL HIGH (ref 0.1–0.9)
Monocytes: 12 %
Neutrophils Absolute: 4.8 10*3/uL (ref 1.4–7.0)
Neutrophils: 56 %
Platelets: 245 10*3/uL (ref 150–450)
RBC: 4.3 x10E6/uL (ref 4.14–5.80)
RDW: 11.8 % (ref 11.6–15.4)
WBC: 8.6 10*3/uL (ref 3.4–10.8)

## 2020-11-22 LAB — LEVETIRACETAM LEVEL: Levetiracetam Lvl: 5.6 ug/mL — ABNORMAL LOW (ref 10.0–40.0)

## 2020-11-22 NOTE — Telephone Encounter (Signed)
CSW called [facility] and set up transportation for upcoming clinic appt  Appt 12/13 at 8:30am  Pick up from home at 7:15-7:45am Pick up from clinic at 9:30am Reservation # [phone]  Pt currently without working phone so made sure to inform paramedic of transport details so she can relay when completing home visit tomorrow  Burna Sis, LCSW Clinical Social Worker [facility] Desk#: [phone] Cell#: [phone]

## 2020-11-23 ENCOUNTER — Other Ambulatory Visit (HOSPITAL_COMMUNITY): Payer: Self-pay

## 2020-11-23 NOTE — Progress Notes (Signed)
Paramedicine Encounter    Patient ID: [name], male    DOB: 1961-04-17, 59 y.o.   MRN: [id number]   Patient Care Team: Claiborne Rigg, NP as PCP - General (Nurse Practitioner) Marinus Maw, MD as PCP - Electrophysiology (Cardiology)  Patient Active Problem List   Diagnosis Date Noted  . Pneumonia due to COVID-19 virus 08/14/2020  . Hyponatremia 08/14/2020  . Elevated AST (SGOT) 08/14/2020  . Prolonged QT interval 08/14/2020  . Cardiac arrest, cause unspecified (HCC) 07/06/2020  . Left arm cellulitis 05/19/2020  . Essential hypertension 05/18/2020  . Mixed hyperlipidemia 05/18/2020  . Alcohol abuse 05/18/2020  . Cellulitis of left upper extremity 05/18/2020  . Lactic acidosis 05/18/2020  . 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 ICD (implantable cardioverter-defibrillator) in place 08/29/2019  . Chronic systolic heart failure (HCC) 08/25/2019  . 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 Coronary artery disease involving native coronary artery of native heart without angina pectoris 12/07/2017    Current Outpatient Medications:  .  albuterol (VENTOLIN HFA) 108 (90 Base) MCG/ACT inhaler, Inhale 2 puffs into the lungs every 6 (six) hours as needed for wheezing or shortness of breath., Disp: 6.7 g, Rfl: 0 .  atorvastatin (LIPITOR) 80 MG tablet, Take 1 tablet (80 mg total) by mouth daily at 6 PM., Disp: 90 tablet, Rfl: 3 .  carvedilol (COREG) 3.125 MG tablet, Take 1 tablet (3.125 mg total) by mouth 2 (two) times daily with a meal., Disp: 60 tablet, Rfl: 2 .  eplerenone (INSPRA) 25 MG tablet, Take 1 tablet (25 mg total) by mouth daily., Disp: 30 tablet, Rfl: 3 .  ezetimibe (ZETIA) 10 MG tablet, Take 1 tablet (10 mg total) by mouth daily., Disp: 30 tablet, Rfl: 6 .  ivabradine (CORLANOR) 5 MG TABS tablet, Take 1 tablet (5 mg total) by mouth 2 (two) times daily with a meal., Disp: 30 tablet, Rfl: 3 .   levETIRAcetam (KEPPRA) 500 MG tablet, Take 1 tablet (500 mg total) by mouth 2 (two) times daily., Disp: 180 tablet, Rfl: 3 .  sacubitril-valsartan (ENTRESTO) 24-26 MG, Take 1 tablet by mouth 2 (two) times daily., Disp: 60 tablet, Rfl: 6 No Known Allergies   Social History   Socioeconomic History  . Marital status: Divorced    Spouse name: Not on file  . Number of children: Not on file  . Years of education: Not on file  . Highest education level: Not on file  Occupational History  . Not on file  Tobacco Use  . Smoking status: Never Smoker  . Smokeless tobacco: Never Used  Vaping Use  . Vaping Use: Never used  Substance and Sexual Activity  . Alcohol use: Yes    Alcohol/week: 14.0 standard drinks    Types: 14 Cans of beer per week    Comment: patient endorses at least 1-2 beers daily (04/2020)  . Drug use: No  . Sexual activity: Not Currently  Other Topics Concern  . Not on file  Social History Narrative   Lives at home with his brother who is crippled. He takes care of his brother.    Right handed    Social Determinants of Health   Financial Resource Strain: High Risk  . Difficulty of Paying Living Expenses: Hard  Food Insecurity: No Food Insecurity  . Worried About Programme researcher, broadcasting/film/video in the Last Year: Never true  . Ran Out of Food in the Last  Year: Never true  Transportation Needs: No Transportation Needs  . Lack of Transportation (Medical): No  . Lack of Transportation (Non-Medical): No  Physical Activity:   . Days of Exercise per Week: Not on file  . Minutes of Exercise per Session: Not on file  Stress:   . Feeling of Stress : Not on file  Social Connections:   . Frequency of Communication with Friends and Family: Not on file  . Frequency of Social Gatherings with Friends and Family: Not on file  . Attends Religious Services: Not on file  . Active Member of Clubs or Organizations: Not on file  . Attends Banker Meetings: Not on file  . Marital  Status: Not on file  Intimate Partner Violence:   . Fear of Current or Ex-Partner: Not on file  . Emotionally Abused: Not on file  . Physically Abused: Not on file  . Sexually Abused: Not on file    Physical Exam Vitals reviewed.  Constitutional:      Appearance: He is normal weight.  HENT:     Head: Normocephalic.     Nose: Nose normal.     Mouth/Throat:     Mouth: Mucous membranes are moist.     Pharynx: Oropharynx is clear.  Eyes:     Pupils: Pupils are equal, round, and reactive to light.  Cardiovascular:     Rate and Rhythm: Normal rate and regular rhythm.     Pulses: Normal pulses.     Heart sounds: Normal heart sounds.  Pulmonary:     Effort: Pulmonary effort is normal.     Breath sounds: Normal breath sounds.  Abdominal:     General: Abdomen is flat.     Palpations: Abdomen is soft.  Musculoskeletal:        General: Normal range of motion.     Cervical back: Normal range of motion.     Right lower leg: No edema.     Left lower leg: No edema.  Skin:    General: Skin is warm and dry.     Capillary Refill: Capillary refill takes less than 2 seconds.  Neurological:     General: No focal deficit present.     Mental Status: He is alert. Mental status is at baseline.  Psychiatric:        Mood and Affect: Mood normal.     Arrived for home visit for Wallis who was alert and ambulating around on his front porch on my arrival. Lenwood denied any shortness of breath, chest pain or seizures. Vitals were obtained. Moiz was compliant with his medications over the last week. Medications were verified and confirmed. Pill box filled accordingly. Enes was reminded of [facility] and provided with an appointment card with transportation details on it. I will see Luke in one week again at home to remind him. Chanceler had no complaints on assessment. No edema, lung sounds clear with no signs of fluid retention. Boone reports he has been eating  good and has no concerns with food insecurity as of right now. Bandon was given a letter from the [employer] for a subpoena for court in February. We will see if we can assist with transportation to same. I made [name] aware. Home visit complete. I will see Shulem in one week.   Refills: NONE    Future Appointments  Date Time Provider Department Center  12/06/2020  8:30 AM [facility] PA/NP [facility] None  12/22/2020  7:25 AM [facility] DEVICE REMOTES [facility]  01/12/2021 10:10 AM Claiborne Rigg, NP [facility] None  03/23/2021  7:25 AM [facility] DEVICE REMOTES [facility]  06/22/2021  7:25 AM [facility] DEVICE REMOTES [facility]  09/21/2021  7:25 AM [facility] DEVICE REMOTES [facility]  12/21/2021  7:25 AM [facility] DEVICE REMOTES [facility]     ACTION: Home visit completed Next visit planned for one week

## 2020-11-30 ENCOUNTER — Other Ambulatory Visit (HOSPITAL_COMMUNITY): Payer: Self-pay

## 2020-11-30 MED FILL — levETIRAcetam 500 MG TABS: 500 | 90 days supply | Qty: 180 | Fill #2

## 2020-11-30 MED FILL — EZETIMIBE 10 MG TABS: 10 | 30 days supply | Qty: 30 | Fill #5

## 2020-11-30 MED FILL — EPLERENONE 25 MG TABS: 25 | 30 days supply | Qty: 30 | Fill #2

## 2020-11-30 NOTE — Progress Notes (Signed)
Paramedicine Encounter    Patient ID: [name], male    DOB: 28-Jan-1961, 59 y.o.   MRN: [id number]   Patient Care Team: Claiborne Rigg, NP as PCP - General (Nurse Practitioner) Marinus Maw, MD as PCP - Electrophysiology (Cardiology)  Patient Active Problem List   Diagnosis Date Noted  . Pneumonia due to COVID-19 virus 08/14/2020  . Hyponatremia 08/14/2020  . Elevated AST (SGOT) 08/14/2020  . Prolonged QT interval 08/14/2020  . Cardiac arrest, cause unspecified (HCC) 07/06/2020  . Left arm cellulitis 05/19/2020  . Essential hypertension 05/18/2020  . Mixed hyperlipidemia 05/18/2020  . Alcohol abuse 05/18/2020  . Cellulitis of left upper extremity 05/18/2020  . Lactic acidosis 05/18/2020  . 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 ICD (implantable cardioverter-defibrillator) in place 08/29/2019  . Chronic systolic heart failure (HCC) 08/25/2019  . 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 Coronary artery disease involving native coronary artery of native heart without angina pectoris 12/07/2017    Current Outpatient Medications:  .  albuterol (VENTOLIN HFA) 108 (90 Base) MCG/ACT inhaler, Inhale 2 puffs into the lungs every 6 (six) hours as needed for wheezing or shortness of breath. (Patient not taking: Reported on 11/23/2020), Disp: 6.7 g, Rfl: 0 .  atorvastatin (LIPITOR) 80 MG tablet, Take 1 tablet (80 mg total) by mouth daily at 6 PM., Disp: 90 tablet, Rfl: 3 .  carvedilol (COREG) 3.125 MG tablet, Take 1 tablet (3.125 mg total) by mouth 2 (two) times daily with a meal., Disp: 60 tablet, Rfl: 2 .  eplerenone (INSPRA) 25 MG tablet, Take 1 tablet (25 mg total) by mouth daily., Disp: 30 tablet, Rfl: 3 .  ezetimibe (ZETIA) 10 MG tablet, Take 1 tablet (10 mg total) by mouth daily., Disp: 30 tablet, Rfl: 6 .  ivabradine (CORLANOR) 5 MG TABS tablet, Take 1 tablet (5 mg total) by mouth 2 (two) times daily  with a meal., Disp: 30 tablet, Rfl: 3 .  levETIRAcetam (KEPPRA) 500 MG tablet, Take 1 tablet (500 mg total) by mouth 2 (two) times daily., Disp: 180 tablet, Rfl: 3 .  sacubitril-valsartan (ENTRESTO) 24-26 MG, Take 1 tablet by mouth 2 (two) times daily., Disp: 60 tablet, Rfl: 6 No Known Allergies   Social History   Socioeconomic History  . Marital status: Divorced    Spouse name: Not on file  . Number of children: Not on file  . Years of education: Not on file  . Highest education level: Not on file  Occupational History  . Not on file  Tobacco Use  . Smoking status: Never Smoker  . Smokeless tobacco: Never Used  Vaping Use  . Vaping Use: Never used  Substance and Sexual Activity  . Alcohol use: Yes    Alcohol/week: 14.0 standard drinks    Types: 14 Cans of beer per week    Comment: patient endorses at least 1-2 beers daily (04/2020)  . Drug use: No  . Sexual activity: Not Currently  Other Topics Concern  . Not on file  Social History Narrative   Lives at home with his brother who is crippled. He takes care of his brother.    Right handed    Social Determinants of Health   Financial Resource Strain: High Risk  . Difficulty of Paying Living Expenses: Hard  Food Insecurity: No Food Insecurity  . Worried About Programme researcher, broadcasting/film/video in the Last Year: Never true  . Ran  Out of Food in the Last Year: Never true  Transportation Needs: No Transportation Needs  . Lack of Transportation (Medical): No  . Lack of Transportation (Non-Medical): No  Physical Activity:   . Days of Exercise per Week: Not on file  . Minutes of Exercise per Session: Not on file  Stress:   . Feeling of Stress : Not on file  Social Connections:   . Frequency of Communication with Friends and Family: Not on file  . Frequency of Social Gatherings with Friends and Family: Not on file  . Attends Religious Services: Not on file  . Active Member of Clubs or Organizations: Not on file  . Attends Tax inspector Meetings: Not on file  . Marital Status: Not on file  Intimate Partner Violence:   . Fear of Current or Ex-Partner: Not on file  . Emotionally Abused: Not on file  . Physically Abused: Not on file  . Sexually Abused: Not on file    Physical Exam Vitals reviewed.  Constitutional:      Appearance: He is normal weight.  HENT:     Head: Normocephalic.     Nose: Nose normal.     Mouth/Throat:     Mouth: Mucous membranes are moist.     Pharynx: Oropharynx is clear.  Eyes:     Pupils: Pupils are equal, round, and reactive to light.  Cardiovascular:     Rate and Rhythm: Normal rate and regular rhythm.     Pulses: Normal pulses.     Heart sounds: Normal heart sounds.  Pulmonary:     Effort: Pulmonary effort is normal.     Breath sounds: Normal breath sounds.  Abdominal:     General: Abdomen is flat.     Palpations: Abdomen is soft.  Musculoskeletal:        General: Normal range of motion.     Cervical back: Normal range of motion.     Right lower leg: No edema.     Left lower leg: No edema.  Skin:    General: Skin is warm and dry.     Capillary Refill: Capillary refill takes less than 2 seconds.  Neurological:     General: No focal deficit present.     Mental Status: He is alert. Mental status is at baseline.  Psychiatric:        Mood and Affect: Mood normal.     Arrived for home visit for [name] who was ambulating outside alert and oriented with no complaints. [name] was compliant with all medications over the last week. Vitals were obtained and were within normal range. No edema, swelling or JVD noted. Lung sounds clear. Medications were reviewed and confirmed. Pill box filled accordingly. [name] agreed to pick up for transportation next week for his clinic visit on Monday. [name] agreed and understood. Home visit complete. I will see [name] in clinic on Monday.  Refills- Keppra Zetia Inspra     Future Appointments  Date Time Provider Department Center   12/06/2020  8:30 AM [facility] PA/NP [facility] None  12/22/2020  7:25 AM [facility] DEVICE REMOTES [facility]  01/12/2021 10:10 AM Claiborne Rigg, NP [facility] None  03/23/2021  7:25 AM [facility] DEVICE [facility]OTES [facility]  06/22/2021  7:25 AM [facility]ICE REMOT[facility]  09/21/2021  7:25 AM [facility]ICE [facility]  12/21/2021  7:25 AM [facility]ICE [facility]     ACTION: Home visit completed Next visit planned for one week

## 2020-12-06 ENCOUNTER — Telehealth (HOSPITAL_COMMUNITY): Payer: Self-pay

## 2020-12-06 ENCOUNTER — Encounter (HOSPITAL_COMMUNITY): Payer: Medicaid Other

## 2020-12-06 NOTE — Telephone Encounter (Signed)
Attempted to reach out to [facility] for appointment reminder with no success at reaching him.

## 2020-12-07 ENCOUNTER — Other Ambulatory Visit (HOSPITAL_COMMUNITY): Payer: Self-pay

## 2020-12-08 MED FILL — CORLANOR 5 MG TABLET: 5 | 30 days supply | Qty: 60 | Fill #1

## 2020-12-08 NOTE — Progress Notes (Signed)
Paramedicine Encounter    Patient ID: [name], male    DOB: [birth year], 59 y.o.   MRN: [id number]   Patient Care Team: Claiborne Rigg, NP as PCP - General (Nurse Practitioner) Marinus Maw, MD as PCP - Electrophysiology (Cardiology)  Patient Active Problem List   Diagnosis Date Noted  . Pneumonia due to COVID-19 virus 08/14/2020  . Hyponatremia 08/14/2020  . Elevated AST (SGOT) 08/14/2020  . Prolonged QT interval 08/14/2020  . Cardiac arrest, cause unspecified (HCC) 07/06/2020  . Left arm cellulitis 05/19/2020  . Essential hypertension 05/18/2020  . Mixed hyperlipidemia 05/18/2020  . Alcohol abuse 05/18/2020  . Cellulitis of left upper extremity 05/18/2020  . Lactic acidosis 05/18/2020  . 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 ICD (implantable cardioverter-defibrillator) in place 08/29/2019  . Chronic systolic heart failure (HCC) 08/25/2019  . 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 Coronary artery disease involving native coronary artery of native heart without angina pectoris 12/07/2017    Current Outpatient Medications:  .  albuterol (VENTOLIN HFA) 108 (90 Base) MCG/ACT inhaler, Inhale 2 puffs into the lungs every 6 (six) hours as needed for wheezing or shortness of breath. (Patient not taking: Reported on 11/23/2020), Disp: 6.7 g, Rfl: 0 .  atorvastatin (LIPITOR) 80 MG tablet, Take 1 tablet (80 mg total) by mouth daily at 6 PM., Disp: 90 tablet, Rfl: 3 .  carvedilol (COREG) 3.125 MG tablet, Take 1 tablet (3.125 mg total) by mouth 2 (two) times daily with a meal., Disp: 60 tablet, Rfl: 2 .  eplerenone (INSPRA) 25 MG tablet, Take 1 tablet (25 mg total) by mouth daily., Disp: 30 tablet, Rfl: 3 .  ezetimibe (ZETIA) 10 MG tablet, Take 1 tablet (10 mg total) by mouth daily., Disp: 30 tablet, Rfl: 6 .  ivabradine (CORLANOR) 5 MG TABS tablet, Take 1 tablet (5 mg total) by mouth 2 (two) times daily  with a meal., Disp: 30 tablet, Rfl: 3 .  levETIRAcetam (KEPPRA) 500 MG tablet, Take 1 tablet (500 mg total) by mouth 2 (two) times daily., Disp: 180 tablet, Rfl: 3 .  sacubitril-valsartan (ENTRESTO) 24-26 MG, Take 1 tablet by mouth 2 (two) times daily., Disp: 60 tablet, Rfl: 6 Allergies  Allergen Reactions  . Spironolactone Other (See Comments)    Gynecomastia      Social History   Socioeconomic History  . Marital status: Divorced    Spouse name: Not on file  . Number of children: Not on file  . Years of education: Not on file  . Highest education level: Not on file  Occupational History  . Not on file  Tobacco Use  . Smoking status: Never Smoker  . Smokeless tobacco: Never Used  Vaping Use  . Vaping Use: Never used  Substance and Sexual Activity  . Alcohol use: Yes    Alcohol/week: 14.0 standard drinks    Types: 14 Cans of beer per week    Comment: patient endorses at least 1-2 beers daily (04/2020)  . Drug use: No  . Sexual activity: Not Currently  Other Topics Concern  . Not on file  Social History Narrative   Lives at home with his brother who is crippled. He takes care of his brother.    Right handed    Social Determinants of Health   Financial Resource Strain: High Risk  . Difficulty of Paying Living Expenses: Hard  Food Insecurity: No Food Insecurity  . Worried  About Running Out of Food in the Last Year: Never true  . Ran Out of Food in the Last Year: Never true  Transportation Needs: No Transportation Needs  . Lack of Transportation (Medical): No  . Lack of Transportation (Non-Medical): No  Physical Activity: Not on file  Stress: Not on file  Social Connections: Not on file  Intimate Partner Violence: Not on file    Physical Exam Vitals reviewed.  Constitutional:      Appearance: He is normal weight.  HENT:     Head: Normocephalic.     Nose: Nose normal.     Mouth/Throat:     Mouth: Mucous membranes are moist.     Pharynx: Oropharynx is clear.   Eyes:     Conjunctiva/sclera: Conjunctivae normal.     Pupils: Pupils are equal, round, and reactive to light.  Cardiovascular:     Rate and Rhythm: Normal rate and regular rhythm.     Pulses: Normal pulses.     Heart sounds: Normal heart sounds.  Pulmonary:     Effort: Pulmonary effort is normal.     Breath sounds: Normal breath sounds.  Abdominal:     General: Abdomen is flat.     Palpations: Abdomen is soft.  Musculoskeletal:        General: Normal range of motion.     Cervical back: Normal range of motion.     Right lower leg: No edema.     Left lower leg: No edema.  Skin:    General: Skin is warm and dry.     Capillary Refill: Capillary refill takes less than 2 seconds.  Neurological:     General: No focal deficit present.     Mental Status: He is alert. Mental status is at baseline.  Psychiatric:        Mood and Affect: Mood normal.        Thought Content: Thought content normal.      Arrived for home visit for Donielle who was alert and oriented reporting he has been feeling fine with no complaints aside from his normal arthritis.   Andrei had clinic visit scheduled for Monday 12/13 and did not show. He reports no one called him and he thought it was on Tuesday, despite him verbally being told and having written appointment instructions with transportation scheduled. I will assist him in rescheduling appointment.    Jashawn was complaint with all medications over the last week. I reviewed medication and confirmed same filling pill box accordingly. I spoke to [place] about moving his medications to [facility] and he was agreeable due to it being closer to home for him. I asked Daviyon if he thought eventually would he be able to manage his medications on his own and he said yes, I used to take them out of the bottles before. I will follow up to assure medications are covered with insurance at [facility] and cost is effective for patient prior to transfer. Vitals were obtained and are  as noted. Patient needs batteries for scale, I can assist with same. (4 AAA) I will see patient in one week in the home. [name] agreed with plan.   Home visit complete.   Refills:  -Corlanor     Future Appointments  Date Time Provider Department Center  12/22/2020  7:25 AM [facility]ICE [facility]ES [facility]  01/12/2021 10:10 AM Claiborne Rigg, NP [facility] None  03/23/2021  7:25 AM [facility] DEVICE REMOTES [facility]  06/22/2021  7:25 AM  [facility] DEVICE REMOTES [facility]  09/21/2021  7:25 AM [facility] DEVICE REMOTES [facility]  12/21/2021  7:25 AM [facility]ICE [facility]OTES [facility]     ACTION: Home visit completed Next visit planned for one week

## 2020-12-10 ENCOUNTER — Telehealth (HOSPITAL_COMMUNITY): Payer: Self-pay | Admitting: Licensed Clinical Social Worker

## 2020-12-10 NOTE — Telephone Encounter (Signed)
CSW informed that pt has lost food stamp card and was having trouble reordering.  CSW called pt to assist with ordering new card- pt reports he found a friend to help him and that his card should arrive in 7-10 days.  States that he has enough food at this time as his friend is helping him purchase food currently.  CSW encouraged pt to reach out if he needs assistance prior to his new card arriving.  Will continue to follow and assist as needed  Burna Sis, LCSW Clinical Social Worker [facility] Desk#: [phone] Cell#: [phone]

## 2020-12-14 ENCOUNTER — Other Ambulatory Visit (HOSPITAL_COMMUNITY): Payer: Self-pay

## 2020-12-14 MED FILL — ENTRESTO 24 MG-26 MG TABLET: 24-26 | 30 days supply | Qty: 60 | Fill #5

## 2020-12-14 MED FILL — CARVEDILOL 3.125 MG TABLET: 3.125 | 30 days supply | Qty: 60 | Fill #0

## 2020-12-14 NOTE — Progress Notes (Signed)
Paramedicine Encounter    Patient ID: [name], male    DOB: August 12, 1961, 59 y.o.   MRN: [id number]   Patient Care Team: Claiborne Rigg, NP as PCP - General (Nurse Practitioner) Marinus Maw, MD as PCP - Electrophysiology (Cardiology)  Patient Active Problem List   Diagnosis Date Noted  . Pneumonia due to COVID-19 virus 08/14/2020  . Hyponatremia 08/14/2020  . Elevated AST (SGOT) 08/14/2020  . Prolonged QT interval 08/14/2020  . Cardiac arrest, cause unspecified (HCC) 07/06/2020  . Left arm cellulitis 05/19/2020  . Essential hypertension 05/18/2020  . Mixed hyperlipidemia 05/18/2020  . Alcohol abuse 05/18/2020  . Cellulitis of left upper extremity 05/18/2020  . Lactic acidosis 05/18/2020  . 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 ICD (implantable cardioverter-defibrillator) in place 08/29/2019  . Chronic systolic heart failure (HCC) 08/25/2019  . 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 Coronary artery disease involving native coronary artery of native heart without angina pectoris 12/07/2017    Current Outpatient Medications:  .  albuterol (VENTOLIN HFA) 108 (90 Base) MCG/ACT inhaler, Inhale 2 puffs into the lungs every 6 (six) hours as needed for wheezing or shortness of breath. (Patient not taking: No sig reported), Disp: 6.7 g, Rfl: 0 .  atorvastatin (LIPITOR) 80 MG tablet, Take 1 tablet (80 mg total) by mouth daily at 6 PM., Disp: 90 tablet, Rfl: 3 .  carvedilol (COREG) 3.125 MG tablet, Take 1 tablet (3.125 mg total) by mouth 2 (two) times daily with a meal., Disp: 60 tablet, Rfl: 2 .  eplerenone (INSPRA) 25 MG tablet, Take 1 tablet (25 mg total) by mouth daily., Disp: 30 tablet, Rfl: 3 .  ezetimibe (ZETIA) 10 MG tablet, Take 1 tablet (10 mg total) by mouth daily., Disp: 30 tablet, Rfl: 6 .  ivabradine (CORLANOR) 5 MG TABS tablet, Take 1 tablet (5 mg total) by mouth 2 (two) times daily with a  meal., Disp: 30 tablet, Rfl: 3 .  levETIRAcetam (KEPPRA) 500 MG tablet, Take 1 tablet (500 mg total) by mouth 2 (two) times daily., Disp: 180 tablet, Rfl: 3 .  sacubitril-valsartan (ENTRESTO) 24-26 MG, Take 1 tablet by mouth 2 (two) times daily., Disp: 60 tablet, Rfl: 6 Allergies  Allergen Reactions  . Spironolactone Other (See Comments)    Gynecomastia      Social History   Socioeconomic History  . Marital status: Divorced    Spouse name: Not on file  . Number of children: Not on file  . Years of education: Not on file  . Highest education level: Not on file  Occupational History  . Not on file  Tobacco Use  . Smoking status: Never Smoker  . Smokeless tobacco: Never Used  Vaping Use  . Vaping Use: Never used  Substance and Sexual Activity  . Alcohol use: Yes    Alcohol/week: 14.0 standard drinks    Types: 14 Cans of beer per week    Comment: patient endorses at least 1-2 beers daily (04/2020)  . Drug use: No  . Sexual activity: Not Currently  Other Topics Concern  . Not on file  Social History Narrative   Lives at home with his brother who is crippled. He takes care of his brother.    Right handed    Social Determinants of Health   Financial Resource Strain: High Risk  . Difficulty of Paying Living Expenses: Hard  Food Insecurity: No Food Insecurity  . Worried  About Running Out of Food in the Last Year: Never true  . Ran Out of Food in the Last Year: Never true  Transportation Needs: No Transportation Needs  . Lack of Transportation (Medical): No  . Lack of Transportation (Non-Medical): No  Physical Activity: Not on file  Stress: Not on file  Social Connections: Not on file  Intimate Partner Violence: Not on file    Physical Exam Vitals reviewed.  Constitutional:      Appearance: He is normal weight.  HENT:     Head: Normocephalic.     Nose: Nose normal.     Mouth/Throat:     Mouth: Mucous membranes are moist.     Pharynx: Oropharynx is clear.  Eyes:      Pupils: Pupils are equal, round, and reactive to light.  Cardiovascular:     Rate and Rhythm: Normal rate and regular rhythm.     Pulses: Normal pulses.     Heart sounds: Normal heart sounds.  Pulmonary:     Effort: Pulmonary effort is normal.     Breath sounds: Normal breath sounds.  Abdominal:     General: Abdomen is flat.     Palpations: Abdomen is soft.  Musculoskeletal:        General: Normal range of motion.     Cervical back: Normal range of motion.     Right lower leg: No edema.     Left lower leg: No edema.  Skin:    General: Skin is warm and dry.     Capillary Refill: Capillary refill takes less than 2 seconds.  Neurological:     General: No focal deficit present.     Mental Status: He is alert. Mental status is at baseline.  Psychiatric:        Mood and Affect: Mood normal.      Arrived for home visit for Deshawn who was alert and oriented reporting he was feeling good but complaining of bilateral hand pain from arthritis. Jacen was compliant with most medications over the last week. Medications were reviewed and confirmed. Pill box filled accordingly. Vitals obtained and are as noted in report. Jaxtin agreed to be medication compliant and remember to take all prescribed medications. No dizziness, chest pain or shortness of breath reported. Journey agreed to visit in one week.    - Currently we are working on getting Wesleys medications stitched to a pharmacy closer to him, however we discussed the importance of him paying for his medications since they are low cost to him. He agreed saying he will start putting money aside for this. I will discuss with him more in depth about pharmacy options next week.    Refills:  Entresto Carvedilol      Future Appointments  Date Time Provider Department Center  12/22/2020  7:25 AM [facility] DEVICE REMOTES [facility]  01/12/2021 10:10 AM Claiborne Rigg, NP [facility] None  03/23/2021  7:25 AM [facility] DEVICE  REMOTES [facility]  06/22/2021  7:25 AM [facility] DEVICE REMOTES [facility]  09/21/2021  7:25 AM [facility] DEVICE REMOTES [facility]  12/21/2021  7:25 AM [facility] DEVICE REMOTES [facility]     ACTION: Home visit completed Next visit planned for one week

## 2020-12-15 ENCOUNTER — Other Ambulatory Visit (HOSPITAL_COMMUNITY): Payer: Self-pay

## 2020-12-15 MED ORDER — CARVEDILOL 3.125 MG PO TABS: 3.1250 mg | ORAL_TABLET | Freq: Two times a day (BID) | ORAL | 4 refills | Status: DC

## 2020-12-21 ENCOUNTER — Other Ambulatory Visit (HOSPITAL_COMMUNITY): Payer: Self-pay

## 2020-12-21 NOTE — Progress Notes (Signed)
Paramedicine Encounter    Patient ID: [name], male    DOB: 1961-10-31, 59 y.o.   MRN: [id number]   Patient Care Team: Claiborne Rigg, NP as PCP - General (Nurse Practitioner) Marinus Maw, MD as PCP - Electrophysiology (Cardiology)  Patient Active Problem List   Diagnosis Date Noted  . Pneumonia due to COVID-19 virus 08/14/2020  . Hyponatremia 08/14/2020  . Elevated AST (SGOT) 08/14/2020  . Prolonged QT interval 08/14/2020  . Cardiac arrest, cause unspecified (HCC) 07/06/2020  . Left arm cellulitis 05/19/2020  . Essential hypertension 05/18/2020  . Mixed hyperlipidemia 05/18/2020  . Alcohol abuse 05/18/2020  . Cellulitis of left upper extremity 05/18/2020  . Lactic acidosis 05/18/2020  . 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 ICD (implantable cardioverter-defibrillator) in place 08/29/2019  . Chronic systolic heart failure (HCC) 08/25/2019  . 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 Coronary artery disease involving native coronary artery of native heart without angina pectoris 12/07/2017    Current Outpatient Medications:  .  albuterol (VENTOLIN HFA) 108 (90 Base) MCG/ACT inhaler, Inhale 2 puffs into the lungs every 6 (six) hours as needed for wheezing or shortness of breath. (Patient not taking: No sig reported), Disp: 6.7 g, Rfl: 0 .  atorvastatin (LIPITOR) 80 MG tablet, Take 1 tablet (80 mg total) by mouth daily at 6 PM., Disp: 90 tablet, Rfl: 3 .  carvedilol (COREG) 3.125 MG tablet, Take 1 tablet (3.125 mg total) by mouth 2 (two) times daily with a meal., Disp: 60 tablet, Rfl: 4 .  eplerenone (INSPRA) 25 MG tablet, Take 1 tablet (25 mg total) by mouth daily., Disp: 30 tablet, Rfl: 3 .  ezetimibe (ZETIA) 10 MG tablet, Take 1 tablet (10 mg total) by mouth daily., Disp: 30 tablet, Rfl: 6 .  ivabradine (CORLANOR) 5 MG TABS tablet, Take 1 tablet (5 mg total) by mouth 2 (two) times daily with a  meal., Disp: 30 tablet, Rfl: 3 .  levETIRAcetam (KEPPRA) 500 MG tablet, Take 1 tablet (500 mg total) by mouth 2 (two) times daily., Disp: 180 tablet, Rfl: 3 .  sacubitril-valsartan (ENTRESTO) 24-26 MG, Take 1 tablet by mouth 2 (two) times daily., Disp: 60 tablet, Rfl: 6 Allergies  Allergen Reactions  . Spironolactone Other (See Comments)    Gynecomastia      Social History   Socioeconomic History  . Marital status: Divorced    Spouse name: Not on file  . Number of children: Not on file  . Years of education: Not on file  . Highest education level: Not on file  Occupational History  . Not on file  Tobacco Use  . Smoking status: Never Smoker  . Smokeless tobacco: Never Used  Vaping Use  . Vaping Use: Never used  Substance and Sexual Activity  . Alcohol use: Yes    Alcohol/week: 14.0 standard drinks    Types: 14 Cans of beer per week    Comment: patient endorses at least 1-2 beers daily (04/2020)  . Drug use: No  . Sexual activity: Not Currently  Other Topics Concern  . Not on file  Social History Narrative   Lives at home with his brother who is crippled. He takes care of his brother.    Right handed    Social Determinants of Health   Financial Resource Strain: High Risk  . Difficulty of Paying Living Expenses: Hard  Food Insecurity: No Food Insecurity  . Worried  About Running Out of Food in the Last Year: Never true  . Ran Out of Food in the Last Year: Never true  Transportation Needs: No Transportation Needs  . Lack of Transportation (Medical): No  . Lack of Transportation (Non-Medical): No  Physical Activity: Not on file  Stress: Not on file  Social Connections: Not on file  Intimate Partner Violence: Not on file    Physical Exam Vitals reviewed.  Constitutional:      Appearance: He is normal weight.  HENT:     Head: Normocephalic.     Nose: Nose normal.     Mouth/Throat:     Mouth: Mucous membranes are moist.     Pharynx: Oropharynx is clear.  Eyes:      Pupils: Pupils are equal, round, and reactive to light.  Cardiovascular:     Rate and Rhythm: Normal rate and regular rhythm.     Pulses: Normal pulses.     Heart sounds: Normal heart sounds.  Pulmonary:     Effort: Pulmonary effort is normal.     Breath sounds: Normal breath sounds.  Abdominal:     General: Abdomen is flat.     Palpations: Abdomen is soft.  Musculoskeletal:        General: Normal range of motion.     Cervical back: Normal range of motion.     Right lower leg: No edema.     Left lower leg: No edema.  Skin:    General: Skin is warm and dry.     Capillary Refill: Capillary refill takes less than 2 seconds.  Neurological:     General: No focal deficit present.     Mental Status: He is alert. Mental status is at baseline.  Psychiatric:        Mood and Affect: Mood normal.     Arrived for home visit for [facility] who was alert and oriented with no reports of any shortness of breath, chest pain or dizziness. [name] was compliant with medications over the last week.  Vitals were obtained and are as recorded. [name] and I reviewed his medications and confirmed same. Pill box was filled accordingly. Connie was agreeable to medications being transferred to [facility] on [address] in [place]. I made call and medications are being transferred. Home visit complete. I will see [name] in one week.    Refills:  Atorvastatin   Future Appointments  Date Time Provider Department Center  12/22/2020  7:25 AM [facility] DEVICE REMOTES [facility]  01/12/2021 10:10 AM Claiborne Rigg, NP [facility] None  03/23/2021  7:25 AM [facility] DEVICE REMOTES [facility]  06/22/2021  7:25 AM [facility] DEVICE REMOTES [facility]  09/21/2021  7:25 AM [facility] DEVICE REMOTES [facility]  12/21/2021  7:25 AM [facility] DEVICE REMOTES [facility]     ACTION: Home visit completed Next visit planned for one week

## 2020-12-23 ENCOUNTER — Telehealth (HOSPITAL_COMMUNITY): Payer: Self-pay | Admitting: Licensed Clinical Social Worker

## 2020-12-23 NOTE — Telephone Encounter (Signed)
CSW called [facility] to confirm pt court date on 12/28/20- they confirm pt still on schedule for that day at 9:30am in courtroom 1A.  CSW also checked waitlist status for [facility] section 8- pt currently number 65 on list.  Pt had no showed for appt on 12/13 with clinic so CSW messaged clinic staff who rescheduled for 1/17.  CSW called [place] Medicaid transport to set up ride for 1/17 appt and 1/19 appt with [facility].  1/17 appt with [facility]- pick up from home 12:15pm-12:45pm, pick up from appt set for 1:30pm  1/19 appt with [facility]- pick up from home 8:55am-9:25am, pick up from appt set for 11:10am  CSW will continue to follow and assist as needed  Burna Sis, LCSW Clinical Social Worker [facility] Desk#: [phone] Cell#: [phone]

## 2020-12-27 ENCOUNTER — Telehealth (HOSPITAL_COMMUNITY): Payer: Self-pay | Admitting: Licensed Clinical Social Worker

## 2020-12-27 NOTE — Telephone Encounter (Signed)
CSW called pt to remind of court date tomorrow.  Received confirmation from transport that ride has been set up.  Pt acknowledges court date tomorrow and time of pick up by transport- CSW texted pt [facility] number and informed him he needs to call them when he done at court.  Will continue to follow and assist as needed  Burna Sis, LCSW Clinical Social Worker [facility] Desk#: [phone] Cell#: [phone]

## 2020-12-28 ENCOUNTER — Encounter (HOSPITAL_COMMUNITY): Payer: Medicaid Other

## 2021-01-04 ENCOUNTER — Other Ambulatory Visit (HOSPITAL_COMMUNITY): Payer: Self-pay

## 2021-01-04 ENCOUNTER — Other Ambulatory Visit: Payer: Self-pay | Admitting: Neurology

## 2021-01-04 ENCOUNTER — Other Ambulatory Visit (HOSPITAL_COMMUNITY): Payer: Self-pay | Admitting: *Deleted

## 2021-01-04 DIAGNOSIS — G40409 Other generalized epilepsy and epileptic syndromes, not intractable, without status epilepticus: Secondary | ICD-10-CM

## 2021-01-04 MED ORDER — ATORVASTATIN CALCIUM 80 MG PO TABS: 80.0000 mg | ORAL_TABLET | Freq: Every day | ORAL | 3 refills | Status: DC

## 2021-01-04 NOTE — Progress Notes (Signed)
Paramedicine Encounter    Patient ID: [name], male    DOB: 1961-10-31, 60 y.o.   MRN: [id number]   Patient Care Team: Claiborne Rigg, NP as PCP - General (Nurse Practitioner) Marinus Maw, MD as PCP - Electrophysiology (Cardiology)  Patient Active Problem List   Diagnosis Date Noted  . Pneumonia due to COVID-19 virus 08/14/2020  . Hyponatremia 08/14/2020  . Elevated AST (SGOT) 08/14/2020  . Prolonged QT interval 08/14/2020  . Cardiac arrest, cause unspecified (HCC) 07/06/2020  . Left arm cellulitis 05/19/2020  . Essential hypertension 05/18/2020  . Mixed hyperlipidemia 05/18/2020  . Alcohol abuse 05/18/2020  . Cellulitis of left upper extremity 05/18/2020  . Lactic acidosis 05/18/2020  . 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 ICD (implantable cardioverter-defibrillator) in place 08/29/2019  . Chronic systolic heart failure (HCC) 08/25/2019  . 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 Coronary artery disease involving native coronary artery of native heart without angina pectoris 12/07/2017    Current Outpatient Medications:  .  albuterol (VENTOLIN HFA) 108 (90 Base) MCG/ACT inhaler, Inhale 2 puffs into the lungs every 6 (six) hours as needed for wheezing or shortness of breath. (Patient not taking: No sig reported), Disp: 6.7 g, Rfl: 0 .  atorvastatin (LIPITOR) 80 MG tablet, Take 1 tablet (80 mg total) by mouth daily at 6 PM., Disp: 90 tablet, Rfl: 3 .  carvedilol (COREG) 3.125 MG tablet, Take 1 tablet (3.125 mg total) by mouth 2 (two) times daily with a meal., Disp: 60 tablet, Rfl: 4 .  eplerenone (INSPRA) 25 MG tablet, Take 1 tablet (25 mg total) by mouth daily., Disp: 30 tablet, Rfl: 3 .  ezetimibe (ZETIA) 10 MG tablet, Take 1 tablet (10 mg total) by mouth daily., Disp: 30 tablet, Rfl: 6 .  ivabradine (CORLANOR) 5 MG TABS tablet, Take 1 tablet (5 mg total) by mouth 2 (two) times daily with a  meal., Disp: 30 tablet, Rfl: 3 .  levETIRAcetam (KEPPRA) 500 MG tablet, Take 1 tablet (500 mg total) by mouth 2 (two) times daily., Disp: 180 tablet, Rfl: 3 .  sacubitril-valsartan (ENTRESTO) 24-26 MG, Take 1 tablet by mouth 2 (two) times daily., Disp: 60 tablet, Rfl: 6 Allergies  Allergen Reactions  . Spironolactone Other (See Comments)    Gynecomastia      Social History   Socioeconomic History  . Marital status: Divorced    Spouse name: Not on file  . Number of children: Not on file  . Years of education: Not on file  . Highest education level: Not on file  Occupational History  . Not on file  Tobacco Use  . Smoking status: Never Smoker  . Smokeless tobacco: Never Used  Vaping Use  . Vaping Use: Never used  Substance and Sexual Activity  . Alcohol use: Yes    Alcohol/week: 14.0 standard drinks    Types: 14 Cans of beer per week    Comment: patient endorses at least 1-2 beers daily (04/2020)  . Drug use: No  . Sexual activity: Not Currently  Other Topics Concern  . Not on file  Social History Narrative   Lives at home with his brother who is crippled. He takes care of his brother.    Right handed    Social Determinants of Health   Financial Resource Strain: High Risk  . Difficulty of Paying Living Expenses: Hard  Food Insecurity: No Food Insecurity  . Worried  About Running Out of Food in the Last Year: Never true  . Ran Out of Food in the Last Year: Never true  Transportation Needs: No Transportation Needs  . Lack of Transportation (Medical): No  . Lack of Transportation (Non-Medical): No  Physical Activity: Not on file  Stress: Not on file  Social Connections: Not on file  Intimate Partner Violence: Not on file    Physical Exam Constitutional:      Appearance: He is normal weight.  HENT:     Head: Normocephalic.     Nose: Nose normal.     Mouth/Throat:     Mouth: Mucous membranes are moist.     Pharynx: Oropharynx is clear.  Eyes:     Pupils: Pupils  are equal, round, and reactive to light.  Cardiovascular:     Rate and Rhythm: Normal rate and regular rhythm.     Pulses: Normal pulses.     Heart sounds: Normal heart sounds.  Pulmonary:     Effort: Pulmonary effort is normal.     Breath sounds: Normal breath sounds.  Abdominal:     General: Abdomen is flat.     Palpations: Abdomen is soft.  Musculoskeletal:        General: Normal range of motion.     Cervical back: Normal range of motion.     Right lower leg: No edema.     Left lower leg: No edema.  Skin:    General: Skin is warm and dry.     Capillary Refill: Capillary refill takes less than 2 seconds.  Neurological:     General: No focal deficit present.     Mental Status: He is alert. Mental status is at baseline.  Psychiatric:        Mood and Affect: Mood normal.    Arrived for home visit for [name] who was alert and oriented walking around outside reporting he was feeling good but has been having some bilateral hand pain and right foot pain. [name] reportedly walks a lot and is out in the cold and has history of arthritis but takes nothing over the counter for same. No injuries to these areas just generalized pain and aches. Vitals were obtained. [name] missed a few doses of his medications over the last week. I reviewed medications and confirmed same filling pill box accordingly. ATORVASTATIN WILL BE MISSING SAT-MON IN THE EVENING. [name] and I reviewed appointments coming up next week and I provided him with written instructions on transport pick up times. [name] agreed to meet at clinic next week rather than at home. [name] knows to bring medications to appointments. I will see [name] next Monday for clinic visit. Home visit complete.   Refills:  -Atorvastatin -Zetia      Future Appointments  Date Time Provider Department Center  01/10/2021  1:30 PM [facility] PA/NP [facility] None  01/12/2021 10:10 AM Claiborne Rigg, NP [facility] None  03/23/2021  7:25 AM [facility] DEVICE  REMOTES [facility]  06/22/2021  7:25 AM [facility] DEVICE REMOTES [facility]  09/21/2021  7:25 AM [facility] DEVICE REMOTES [facility]  12/21/2021  7:25 AM [facility] DEVICE REMOTES [facility]     ACTION: Home visit completed Next visit planned for one week

## 2021-01-07 ENCOUNTER — Telehealth: Payer: Self-pay | Admitting: Licensed Clinical Social Worker

## 2021-01-07 ENCOUNTER — Telehealth (HOSPITAL_COMMUNITY): Payer: Self-pay

## 2021-01-07 NOTE — Telephone Encounter (Signed)
Spoke to [name] and reported to him the changes in his appointment and transportation changes. He agreed with same, I will follow up next week.

## 2021-01-07 NOTE — Telephone Encounter (Signed)
CSW called Medicaid transport to cancel ride for appt Monday as clinic is closing for inclement weather that day.  CSW able to reschedule ride for new appt on 01/18/21.  Pick up from home: 9:15am-9:45am Appt time: 10:30am Pick up from appt scheduled for 11:30am Confirmation #: [phone]  CSW attempted to call pt to inform of appt changes- unable to reach- will continue to reach out  Burna Sis, LCSW Clinical Social Worker [facility] Desk#: [phone] Cell#: [phone]

## 2021-01-10 ENCOUNTER — Encounter (HOSPITAL_COMMUNITY): Payer: Medicaid Other

## 2021-01-11 ENCOUNTER — Telehealth (HOSPITAL_COMMUNITY): Payer: Self-pay

## 2021-01-11 NOTE — Telephone Encounter (Signed)
Called [name] and got no answer. I left a VM to remind him of appointment tomorrow with transportation as follows:   1/19 appt with [facility]- pick up from home 8:55am-9:25am, pick up from appt set for 11:10am  I will continue to follow up.

## 2021-01-12 ENCOUNTER — Ambulatory Visit: Payer: Medicaid Other | Admitting: Nurse Practitioner

## 2021-01-12 ENCOUNTER — Telehealth (HOSPITAL_COMMUNITY): Payer: Self-pay | Admitting: Licensed Clinical Social Worker

## 2021-01-12 NOTE — Telephone Encounter (Signed)
CSW spoke to pt this morning who reports that ride never came for [facility] appt.  CSW called [employer] who states ride was canceled- no notes as to why it was cancelled.  CSW updated [employer] who will help reschedule pt PCP appt.  Will continue to follow and assist as needed  Burna Sis, LCSW Clinical Social Worker [facility] Desk#: [phone] Cell#: [phone]

## 2021-01-14 ENCOUNTER — Telehealth (HOSPITAL_COMMUNITY): Payer: Self-pay | Admitting: Licensed Clinical Social Worker

## 2021-01-14 NOTE — Telephone Encounter (Signed)
CSW called [facility] Medicaid transport to confirm pt still has ride scheduled for Tuesday to come to [facility] Clinic- was able to confirm transport details for Tuesday- patient is aware  Will continue to follow and assist as needed  Burna Sis, LCSW Clinical Social Worker [facility] Desk#: [phone] Cell#: [phone]

## 2021-01-17 NOTE — Progress Notes (Addendum)
ADVANCED HF CLINIC NOTE  PCP: Bertram Denver, NP HF Cardiologist: Dr Gala Romney   Reason for Visit: F/u for chronic systolic heart failure   HPI: [name] is a 60 y.o. male with h/o ETOH, VF Arrest, CAD s/p CABG 12/07/17, and ICM with EF 25-30%.   Admitted 12/18 with VF arrest while chopping wood. Received CPR in the field. R/LHC with severe 3vD as below and cardiogenic shock requiring IABP. Required milrinone with continued shock. Pt improved and IABP removed 12/10, but pt had recurrent VF arrest so IABP replaced. Stabilized and underwent CABG 12/07/17. Tolerated gradual wean of meds and extubation with no furthe events. HF medications titrated as tolerated.  Echo 04/2019  LVEF at 25%.    9/20 implantation of a [employer] single chamber ICD.   He had routine clinic f/u on 10/23/19 and while in exam room had a witnessed seizure  with loss of pulse. CPR started with quick ROSC. He did not require shock. Taken to the ED and had another seizure. Neurology consulted. Started on Keppra. HS Trop 10>12.  ICD interrogated. No arrhythmias noted. Admitted to [facility] on 12/09/19 with recurrent seizures in setting of noncompliance with his Keppra.   Admitted 01/08/20 after syncopal episode. Had AKI so HF meds stopped. Given IV fluids and started back on coreg, spiro, and lasix. [name] was stopped. Had return f/u 2/21 and AKI had resolved. Entresto resumed.   Admitted 8/21 with COVID PNA. He was set up for post hospital f/u in [facility] on 8/31 but no showed to the appointment.   He returned for f/u on 9/21. Following with Paramedicine. Still drinking beer regularly. No falls. His fluid status is good on exam and by device interrogation. HeartLogic Score is 0. No VT.He walks most places. EKG today shows NSR w/ PVC. His only complaint is bilateral breast tenderness, which may be from spironolactone. [name] switched to eplerenone and ivabradine increased to 5 mg daily.  Today  he returns for HF follow up. Overall feeling "shitty". SOB with any activity, + productive cough, green phlegm. Recent URI symptoms for past 2 weeks. Advised by Paramedicine to get COVID test, patient declined. Had f/u with PCP but was unable to make appt due to transportation issues. Having dizziness, this is chronic for him. Drinking 2-3 24 oz ETOH /day. Denies CP, edema, or PND/Orthopnea. Appetite ok. No fever or chills. Weight at home ~145 pounds. Taking all medications.   ROS: All systems negative except as listed in HPI, PMH and Problem List.  SH:  Social History   Socioeconomic History  . Marital status: Divorced    Spouse name: Not on file  . Number of children: Not on file  . Years of education: Not on file  . Highest education level: Not on file  Occupational History  . Not on file  Tobacco Use  . Smoking status: Never Smoker  . Smokeless tobacco: Never Used  Vaping Use  . Vaping Use: Never used  Substance and Sexual Activity  . Alcohol use: Yes    Alcohol/week: 14.0 standard drinks    Types: 14 Cans of beer per week    Comment: patient endorses at least 1-2 beers daily (04/2020)  . Drug use: No  . Sexual activity: Not Currently  Other Topics Concern  . Not on file  Social History Narrative   Lives at home with his brother who is crippled. He takes care of his brother.  Right handed    Social Determinants of Health   Financial Resource Strain: High Risk  . Difficulty of Paying Living Expenses: Hard  Food Insecurity: No Food Insecurity  . Worried About Programme researcher, broadcasting/film/video in the Last Year: Never true  . Ran Out of Food in the Last Year: Never true  Transportation Needs: No Transportation Needs  . Lack of Transportation (Medical): No  . Lack of Transportation (Non-Medical): No  Physical Activity: Not on file  Stress: Not on file  Social Connections: Not on file  Intimate Partner Violence: Not on file    FH:  Family History  Problem Relation Age of Onset  .  Heart attack Other   . Diabetes Neg Hx     Past Medical History:  Diagnosis Date  . AICD (automatic cardioverter/defibrillator) present   . Alcohol abuse 05/18/2020  . Arthritis    "hands; maybe my toes" (12/20/2017)  . CAD (coronary artery disease)   . Cardiac arrest (HCC) 11/29/2017   hx VF arrest/notes 12/20/2017  . Chronic systolic heart failure (HCC) 08/25/2019  . Coronary artery disease   . Coronary artery disease involving native coronary artery of native heart without angina pectoris 12/07/2017  . Essential hypertension 05/18/2020  . GERD (gastroesophageal reflux disease)   . H/O ETOH abuse    /notes 12/20/2017  . High cholesterol   . Hypertension   . Ischemic cardiomyopathy    a. 08/2019 s/p BSX D232 single lead AICD.  Marland Kitchen Mixed hyperlipidemia 05/18/2020  . Seizure (HCC) 10/21/2019  . Seizures (HCC) ~ 2016; ~ 2017   "I've had a couple; I was at work" (12/20/2017)  . 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 Syncope and collapse    "last few days" (12/20/2017)    Current Outpatient Medications  Medication Sig Dispense Refill  . albuterol (VENTOLIN HFA) 108 (90 Base) MCG/ACT inhaler Inhale 2 puffs into the lungs every 6 (six) hours as needed for wheezing or shortness of breath. 6.7 g 0  . atorvastatin (LIPITOR) 80 MG tablet Take 1 tablet (80 mg total) by mouth daily at 6 PM. 90 tablet 3  . carvedilol (COREG) 3.125 MG tablet Take 1 tablet (3.125 mg total) by mouth 2 (two) times daily with a meal. 60 tablet 4  . eplerenone (INSPRA) 25 MG tablet Take 1 tablet (25 mg total) by mouth daily. 30 tablet 3  . ezetimibe (ZETIA) 10 MG tablet Take 1 tablet (10 mg total) by mouth daily. 30 tablet 6  . ivabradine (CORLANOR) 5 MG TABS tablet Take 1 tablet (5 mg total) by mouth 2 (two) times daily with a meal. 30 tablet 3  . levETIRAcetam (KEPPRA) 500 MG tablet TAKE 1 TABLET BY MOUTH 2 TIMES DAILY 180 tablet 3   . sacubitril-valsartan (ENTRESTO) 24-26 MG Take 1 tablet by mouth 2 (two) times daily. 60 tablet 6   No current facility-administered medications for this encounter.    Vitals:   01/18/21 1024  BP: 115/80  Pulse: 65  SpO2: 99%  Weight: 66.9 kg (147 lb 6.4 oz)   Wt Readings from Last 3 Encounters:  01/18/21 66.9 kg (147 lb 6.4 oz)  01/04/21 65.8 kg (145 lb)  12/21/20 65.8 kg (145 lb)   PHYSICAL EXAM: General:  Appears older than stated age. NAD. No resp difficulty HEENT: Normal Neck: Supple. No JVD. Carotids 2+ bilat; no bruits. No lymphadenopathy or thryomegaly appreciated. Cor: PMI nondisplaced. Regular rate &  rhythm. No rubs, gallops or murmurs. Lungs: Bilateral rhonchi upper lobes, diminished in bases Abdomen: Soft, nontender, nondistended. No hepatosplenomegaly. No bruits or masses. Good bowel sounds. Extremities: No cyanosis, clubbing, rash, edema Neuro: alert & oriented x 3, cranial nerves grossly intact. Moves all 4 extremities w/o difficulty. Appears intoxicated.  ECG: SR 64 bpm, qtc 491 (personally reviewed).   ASSESSMENT & PLAN: 1. Seizures: - Denies any recent recurrence.  - Continue Keppra 500 mg bid.  - Followed by Neurology.   2. Chronic systolic CHF with ICM - TTE 11/30/17 25-30% RV normal.  Echo 03/2018: EF 40-45%  RV normal.  - ECHO 5/20 EF ~ 25%. Has ICD.  - ECHO 12/2019 EF 30-35% RV normal - NYHA II-III, functional status confounded by recent URI. Euolemic on exam, weight stable, and by HeartLogic score 0. - Continue PRN lasix 20 mg.  - Continue eplerenone 25 mg daily.  - Continue Coreg 3.125 mg twice a day. Consider increasing at next appt. - Continue Entresto 24-26 mg twice a day. He did not tolerate 49-51 mg dose due to hypotension.  - Continue Corlanor 5 mg bid. HR 64 today. - Not a candidate for advanced therapies due to ongoing alcohol abuse. - BMET today.  3. CAD s/p CABG 12/07/2017 - Stable w/o CP.  - Continue ASA, ? blocker, atorvastatin +  zetia.   4. H/o VF arrest in setting of STEMI - Has ICD, followed by EP - Device interrogation reveals no VT   5. Cough - Productive, + green phlegm, no fever/chills. - Bilateral rhonchi on exam. - h/o COVID+ PNA with hospitalization, he is unvaccinated. - Paramedicine assisting in making f/u PCP appt asap. - Encouraged him to get COVID test today & vaccinated. - CXR, CBC today.   6. ETOH abuse  - Still drinking ~2-3 24 oz beers daily.  - Discussed alcohol cessation. He has no plans to quit.    7. H/O Noncompliance/ Poor Health Literacy  - Followed by HF Paramedicine.  Appreciate their assistance.  - Discussed importance of strict compliance w/ meds.  8. HLD: - Lipid panel 3/21 showed LDL 86 mg/dL on atorvastatin 80 mg daily + Zetia. - Repeat FLP and HFTs (9/21) ok; LDL 68  Continue w/ paramedicine. Needs to see PCP for URI/cough. F/u in 3 months.   Anderson Malta Cyler Kappes  FNP-BC 11:53 AM

## 2021-01-18 ENCOUNTER — Ambulatory Visit (HOSPITAL_COMMUNITY)
Admission: RE | Admit: 2021-01-18 | Discharge: 2021-01-18 | Disposition: A | Discharge status: Home / Self Care | Payer: Medicaid Other | Source: Ambulatory Visit | Attending: Family Medicine | Admitting: Family Medicine

## 2021-01-18 ENCOUNTER — Other Ambulatory Visit: Payer: Self-pay

## 2021-01-18 ENCOUNTER — Encounter (HOSPITAL_COMMUNITY): Payer: Self-pay

## 2021-01-18 ENCOUNTER — Other Ambulatory Visit (HOSPITAL_COMMUNITY): Payer: Self-pay

## 2021-01-18 ENCOUNTER — Ambulatory Visit (HOSPITAL_COMMUNITY)
Admission: RE | Admit: 2021-01-18 | Discharge: 2021-01-18 | Disposition: A | Discharge status: Home / Self Care | Payer: Medicaid Other | Source: Ambulatory Visit | Attending: Cardiology | Admitting: Cardiology

## 2021-01-18 VITALS — BP 115/80 | HR 65 | Wt 147.4 lb

## 2021-01-18 DIAGNOSIS — R569 Unspecified convulsions: Secondary | ICD-10-CM

## 2021-01-18 DIAGNOSIS — Z951 Presence of aortocoronary bypass graft: Secondary | ICD-10-CM

## 2021-01-18 DIAGNOSIS — I2510 Atherosclerotic heart disease of native coronary artery without angina pectoris: Secondary | ICD-10-CM | POA: Diagnosis not present

## 2021-01-18 DIAGNOSIS — E782 Mixed hyperlipidemia: Secondary | ICD-10-CM | POA: Diagnosis not present

## 2021-01-18 DIAGNOSIS — I110 Hypertensive heart disease with heart failure: Secondary | ICD-10-CM | POA: Insufficient documentation

## 2021-01-18 DIAGNOSIS — R059 Cough, unspecified: Secondary | ICD-10-CM

## 2021-01-18 DIAGNOSIS — E785 Hyperlipidemia, unspecified: Secondary | ICD-10-CM

## 2021-01-18 DIAGNOSIS — R42 Dizziness and giddiness: Secondary | ICD-10-CM | POA: Insufficient documentation

## 2021-01-18 DIAGNOSIS — J1282 Pneumonia due to coronavirus disease 2019: Secondary | ICD-10-CM | POA: Diagnosis not present

## 2021-01-18 DIAGNOSIS — I255 Ischemic cardiomyopathy: Secondary | ICD-10-CM | POA: Insufficient documentation

## 2021-01-18 DIAGNOSIS — Z8674 Personal history of sudden cardiac arrest: Secondary | ICD-10-CM | POA: Diagnosis not present

## 2021-01-18 DIAGNOSIS — Z9119 Patient's noncompliance with other medical treatment and regimen: Secondary | ICD-10-CM | POA: Diagnosis not present

## 2021-01-18 DIAGNOSIS — Z9114 Patient's other noncompliance with medication regimen: Secondary | ICD-10-CM | POA: Diagnosis not present

## 2021-01-18 DIAGNOSIS — I252 Old myocardial infarction: Secondary | ICD-10-CM | POA: Insufficient documentation

## 2021-01-18 DIAGNOSIS — I959 Hypotension, unspecified: Secondary | ICD-10-CM | POA: Insufficient documentation

## 2021-01-18 DIAGNOSIS — Z79899 Other long term (current) drug therapy: Secondary | ICD-10-CM | POA: Insufficient documentation

## 2021-01-18 DIAGNOSIS — Z8249 Family history of ischemic heart disease and other diseases of the circulatory system: Secondary | ICD-10-CM | POA: Diagnosis not present

## 2021-01-18 DIAGNOSIS — U071 COVID-19: Secondary | ICD-10-CM | POA: Diagnosis not present

## 2021-01-18 DIAGNOSIS — F1010 Alcohol abuse, uncomplicated: Secondary | ICD-10-CM | POA: Diagnosis not present

## 2021-01-18 DIAGNOSIS — I5022 Chronic systolic (congestive) heart failure: Secondary | ICD-10-CM | POA: Diagnosis not present

## 2021-01-18 DIAGNOSIS — R918 Other nonspecific abnormal finding of lung field: Secondary | ICD-10-CM | POA: Diagnosis not present

## 2021-01-18 DIAGNOSIS — Z91199 Patient's noncompliance with other medical treatment and regimen due to unspecified reason: Secondary | ICD-10-CM

## 2021-01-18 DIAGNOSIS — Z95810 Presence of automatic (implantable) cardiac defibrillator: Secondary | ICD-10-CM | POA: Diagnosis not present

## 2021-01-18 LAB — CBC
HCT: 39.5 % (ref 39.0–52.0)
Hemoglobin: 13.2 g/dL (ref 13.0–17.0)
MCH: 32.1 pg (ref 26.0–34.0)
MCHC: 33.4 g/dL (ref 30.0–36.0)
MCV: 96.1 fL (ref 80.0–100.0)
Platelets: 212 10*3/uL (ref 150–400)
RBC: 4.11 MIL/uL — ABNORMAL LOW (ref 4.22–5.81)
RDW: 11.9 % (ref 11.5–15.5)
WBC: 8.4 10*3/uL (ref 4.0–10.5)
nRBC: 0 % (ref 0.0–0.2)

## 2021-01-18 LAB — BASIC METABOLIC PANEL
Anion gap: 10 (ref 5–15)
BUN: 10 mg/dL (ref 6–20)
CO2: 22 mmol/L (ref 22–32)
Calcium: 8.6 mg/dL — ABNORMAL LOW (ref 8.9–10.3)
Chloride: 105 mmol/L (ref 98–111)
Creatinine, Ser: 0.92 mg/dL (ref 0.61–1.24)
GFR, Estimated: >60 mL/min (ref >60)
Glucose, Bld: 90 mg/dL (ref 70–99)
Potassium: 3.9 mmol/L (ref 3.5–5.1)
Sodium: 137 mmol/L (ref 135–145)

## 2021-01-18 NOTE — Patient Instructions (Signed)
Labs done today, your results will be available in MyChart, we will contact you for abnormal readings.  A chest x-ray takes a picture of the organs and structures inside the chest, including the heart, lungs, and blood vessels. This test can show several things, including, whether the heart is enlarges; whether fluid is building up in the lungs; and whether pacemaker / defibrillator leads are still in place.  Your physician recommends that you schedule a follow-up appointment in: 3-4 months  If you have any questions or concerns before your next appointment please send [place] a message through [place] or call our office at [phone].    TO LEAVE A MESSAGE FOR THE NURSE SELECT OPTION 2, PLEASE LEAVE A MESSAGE INCLUDING: . YOUR NAME . DATE OF BIRTH . CALL BACK NUMBER . REASON FOR CALL**this is important as we prioritize the call backs  YOU WILL RECEIVE A CALL BACK THE SAME DAY AS LONG AS YOU CALL BEFORE 4:00 PM

## 2021-01-18 NOTE — Progress Notes (Signed)
Paramedicine Encounter  [name] today in clinic with Jose Rome NP. [name] reported having shortness of breath with congestion and a productive cough. [name] supposed to be seen by his PCP last week but his transportation fell through. I am working on getting him rescheduled for same. I scheduled [name] [facility] [place] for [facility] at 11:15. [name] was given written instructions on this appointment. This is within walking distance to his home as he reports he will walk there. Appointments confirmed. [name] made no changes to medications today but did order a chest x-ray due to increased congestion with listening to lung sounds. Chest X-Ray results okay per Jose Rome NP. I reviewed medications and filled pill box accordingly. Clinic visit complete. I will see [name] in home in one week.    Refills: NONE  ACTION: Home visit completed Next visit planned for one week

## 2021-01-18 NOTE — Addendum Note (Signed)
Encounter addended by: Jacklynn Ganong, FNP on: 01/18/2021 11:59 AM  Actions taken: Clinical Note Signed

## 2021-01-19 ENCOUNTER — Telehealth (HOSPITAL_COMMUNITY): Payer: Self-pay

## 2021-01-19 NOTE — Telephone Encounter (Signed)
Spoke to [place] who reports [facility] to [facility] to [facility] to take [facility] test and he went through the drive through as instructed and he reports [facility] told him they couldn't do it. I am unsure what this means as we have confirmation of drive thru appointment. I will follow up with patient next week and get him an appointment with PCP.

## 2021-01-20 ENCOUNTER — Telehealth (HOSPITAL_COMMUNITY): Payer: Self-pay | Admitting: Licensed Clinical Social Worker

## 2021-01-20 NOTE — Telephone Encounter (Signed)
Apartment applications sent in for patient to [place] apartments and to [employer].  CSW will continue to follow and assist as needed  Burna Sis, LCSW Clinical Social Worker [facility] Desk#: [phone] Cell#: [phone]

## 2021-01-22 ENCOUNTER — Other Ambulatory Visit: Payer: Self-pay | Admitting: Internal Medicine

## 2021-01-23 DIAGNOSIS — T07XXXA Unspecified multiple injuries, initial encounter: Secondary | ICD-10-CM | POA: Diagnosis not present

## 2021-01-23 DIAGNOSIS — R58 Hemorrhage, not elsewhere classified: Secondary | ICD-10-CM | POA: Diagnosis not present

## 2021-01-23 DIAGNOSIS — R519 Headache, unspecified: Secondary | ICD-10-CM | POA: Diagnosis not present

## 2021-01-23 DIAGNOSIS — G4489 Other headache syndrome: Secondary | ICD-10-CM | POA: Diagnosis not present

## 2021-01-23 DIAGNOSIS — S5292XA Unspecified fracture of left forearm, initial encounter for closed fracture: Secondary | ICD-10-CM | POA: Diagnosis not present

## 2021-01-23 DIAGNOSIS — R52 Pain, unspecified: Secondary | ICD-10-CM | POA: Diagnosis not present

## 2021-01-25 ENCOUNTER — Telehealth (INDEPENDENT_AMBULATORY_CARE_PROVIDER_SITE_OTHER): Payer: Medicaid Other | Admitting: Physician Assistant

## 2021-01-25 ENCOUNTER — Other Ambulatory Visit (HOSPITAL_COMMUNITY): Payer: Self-pay

## 2021-01-25 VITALS — BP 132/80 | HR 90 | Resp 18 | Wt 147.0 lb

## 2021-01-25 DIAGNOSIS — S42301A Unspecified fracture of shaft of humerus, right arm, initial encounter for closed fracture: Secondary | ICD-10-CM

## 2021-01-25 DIAGNOSIS — R058 Other specified cough: Secondary | ICD-10-CM

## 2021-01-25 MED ORDER — CETIRIZINE HCL 10 MG PO TABS: 10.0000 mg | ORAL_TABLET | Freq: Every day | ORAL | 11 refills | Status: DC

## 2021-01-25 MED ORDER — GUAIFENESIN ER 600 MG PO TB12: 600.0000 mg | ORAL_TABLET | Freq: Two times a day (BID) | ORAL | 2 refills | Status: AC
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
## 2021-01-27 ENCOUNTER — Encounter: Payer: Self-pay | Admitting: Physician Assistant

## 2021-01-27 ENCOUNTER — Telehealth (HOSPITAL_COMMUNITY): Payer: Self-pay

## 2021-01-27 DIAGNOSIS — S42301A Unspecified fracture of shaft of humerus, right arm, initial encounter for closed fracture: Secondary | ICD-10-CM

## 2021-01-27 HISTORY — DX: Unspecified fracture of shaft of humerus, right arm, initial encounter for closed fracture: S42.301A

## 2021-01-27 NOTE — Telephone Encounter (Signed)
[name] called me at 1500 reporting his phone is now working and operating properly.   Earlier today I spoke to [facility] and scheduled his initial appointment for Feb 9th at 1:45, Eileen Stanford will be setting up transportation.   Call complete.

## 2021-01-28 ENCOUNTER — Telehealth (HOSPITAL_COMMUNITY): Payer: Self-pay | Admitting: Licensed Clinical Social Worker

## 2021-01-28 NOTE — Telephone Encounter (Signed)
CSW called [facility] Medicaid transport to set up ride for ortho appt next week.  Ride set for Feb. 9th Pick up from home between 12:30-1pm Pick up from MD appt 2:30-2:45pm Confirmation #: [id number]  Will continue to follow and assist as needed  Burna Sis, LCSW Clinical Social Worker [facility] Desk#: [phone] Cell#: [phone]

## 2021-02-01 ENCOUNTER — Other Ambulatory Visit (HOSPITAL_COMMUNITY): Payer: Self-pay
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
## 2021-02-02 ENCOUNTER — Ambulatory Visit: Payer: Medicaid Other | Admitting: Physician Assistant

## 2021-02-02 ENCOUNTER — Telehealth (HOSPITAL_COMMUNITY): Payer: Self-pay | Admitting: Licensed Clinical Social Worker

## 2021-02-02 NOTE — Telephone Encounter (Signed)
Pt missed ortho appt today due to Medicaid transportation issue.  CSW called ortho and had him rescheduled for 2/14 at 2:45pm  Pt getting his phone fixed today so CSW will assist him with getting transport once we can speak and confirm he can got to appt.  Will continue to follow and assist as needed  Burna Sis, LCSW Clinical Social Worker [facility] Desk#: [phone] Cell#: [phone]

## 2021-02-03 ENCOUNTER — Telehealth (HOSPITAL_COMMUNITY): Payer: Self-pay | Admitting: Licensed Clinical Social Worker

## 2021-02-03 NOTE — Telephone Encounter (Signed)
CSW received call from pt on friends phone, [phone], inquiring about what to do about missed ortho appt.  CSW informed pt that I was able to get new appt for Monday- CSW set up Medicaid transrport:  Pick up from home: 1:30-2pm Appt time : 2:45pm Pick up from appt: at 3:45pm  Medicaid transport # is [phone] in case pt is done prior to 3:45pm or if he needs to stay past 3:45pm  Pt informed of all of the above- he is not sure if he will have working phone by that time but will call CSW if he is able to get phone working.  Will continue to follow and assist as needed  Burna Sis, LCSW Clinical Social Worker [facility] Desk#: [phone] Cell#: [phone]

## 2021-02-07 ENCOUNTER — Ambulatory Visit (INDEPENDENT_AMBULATORY_CARE_PROVIDER_SITE_OTHER): Payer: Medicaid Other

## 2021-02-07 ENCOUNTER — Other Ambulatory Visit: Payer: Self-pay

## 2021-02-07 ENCOUNTER — Encounter: Payer: Self-pay | Admitting: Physician Assistant

## 2021-02-07 ENCOUNTER — Ambulatory Visit (INDEPENDENT_AMBULATORY_CARE_PROVIDER_SITE_OTHER): Payer: Medicaid Other | Admitting: Physician Assistant

## 2021-02-07 DIAGNOSIS — S52602A Unspecified fracture of lower end of left ulna, initial encounter for closed fracture: Secondary | ICD-10-CM

## 2021-02-07 DIAGNOSIS — M79642 Pain in left hand: Secondary | ICD-10-CM | POA: Diagnosis not present

## 2021-02-07 DIAGNOSIS — S52601A Unspecified fracture of lower end of right ulna, initial encounter for closed fracture: Secondary | ICD-10-CM | POA: Diagnosis not present

## 2021-02-07 NOTE — Progress Notes (Signed)
Office Visit Note   Patient: [name]           Date of Birth: 01/23/1961           MRN: [id number] Visit Date: 02/07/2021              Requested by: Mayers, Kasandra Knudsen, PA-C [address] [place],  [place] [address] PCP: Claiborne Rigg, NP   Assessment & Plan: Visit Diagnoses:  1. Pain in left hand   2. Closed fracture of distal end of right ulna, unspecified fracture morphology, initial encounter     Plan: Placed placed in a short arm cast which is molded.  We will have him follow-up with [place] in 3 weeks we will remove the cast at that time and obtain 2 views of the left forearm at that time.  He is to keep the cast clean dry intact.  Elevation of the hand above the heart wiggling fingers encouraged.  Questions were encouraged and answered at length.  Follow-Up Instructions: Return in about 3 weeks (around 02/28/2021) for Radiographs.   Orders:  Orders Placed This Encounter  Procedures  . XR Hand Complete Left  . XR Forearm Left   No orders of the defined types were placed in this encounter.     Procedures: No procedures performed   Clinical Data: No additional findings.   Subjective: Chief Complaint  Patient presents with  . Left Hand - Pain  . Left Forearm - Pain    HPI [name] is a 60 year old male comes in today with left forearm pain.  He reports that he fell over a chair on Wednesday.  Referred from [facility] PA-C for left forearm fracture.  Patient states is not taking anything for pain.  7 pain that radiates from the distal forearm down into the hand.  Denies any other injury.  He was placed in a splint but he took it off himself and presents today with no splint or brace on today.  No new injuries otherwise.   Review of Systems See HPI otherwise negative or noncontributory.  Objective: Vital Signs: There were no vitals taken for this visit.  Physical Exam Constitutional:      Appearance: He is not ill-appearing or  diaphoretic.  Pulmonary:     Effort: Pulmonary effort is normal.     Ortho Exam Left arm: Has tenderness over the distal ulnar shaft.  There is swelling distal forearm.  No tenderness proximal forearm or elbow.  He has full range of motion of the hand and full motor.  Radial pulse left wrist 2+.  No gross deformity of left forearm. Specialty Comments:  No specialty comments available.  Imaging: XR Forearm Left  Result Date: 02/07/2021 Left forearm 2 views: Mildly displaced ulnar styloid fracture and a mildly displaced valgus deformity of the distal ulnar shaft.  No fracture involving the radius.  Elbow is well located.  No other fractures or dislocations noted.  XR Hand Complete Left  Result Date: 02/07/2021 Left hand 3 views: No acute fractures.  Distal ulnar fracture involving the styloid and the distal shaft is seen.  Arthrosclerosis noted.  Chronic healed third metatarsal head fracture noted.  No other bony abnormalities.    PMFS History: Patient Active Problem List   Diagnosis Date Noted  . Closed fracture of right upper limb 01/27/2021  . Pneumonia due to COVID-19 virus 08/14/2020  . Hyponatremia 08/14/2020  . Elevated AST (SGOT) 08/14/2020  . Prolonged QT  interval 08/14/2020  . Cardiac arrest, cause unspecified (HCC) 07/06/2020  . Left arm cellulitis 05/19/2020  . Essential hypertension 05/18/2020  . Mixed hyperlipidemia 05/18/2020  . Alcohol abuse 05/18/2020  . Cellulitis of left upper extremity 05/18/2020  . Lactic acidosis 05/18/2020  . 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 ICD (implantable cardioverter-defibrillator) in place 08/29/2019  . Chronic systolic heart failure (HCC) 08/25/2019  . 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 Coronary artery disease involving native coronary artery of native heart without angina pectoris 12/07/2017   Past Medical History:  Diagnosis Date  . AICD  (automatic cardioverter/defibrillator) present   . Alcohol abuse 05/18/2020  . Arthritis    "hands; maybe my toes" (12/20/2017)  . CAD (coronary artery disease)   . Cardiac arrest (HCC) 11/29/2017   hx VF arrest/notes 12/20/2017  . Chronic systolic heart failure (HCC) 08/25/2019  . Coronary artery disease   . Coronary artery disease involving native coronary artery of native heart without angina pectoris 12/07/2017  . Essential hypertension 05/18/2020  . GERD (gastroesophageal reflux disease)   . H/O ETOH abuse    /notes 12/20/2017  . High cholesterol   . Hypertension   . Ischemic cardiomyopathy    a. 08/2019 s/p BSX D232 single lead AICD.  Marland Kitchen Mixed hyperlipidemia 05/18/2020  . Seizure (HCC) 10/21/2019  . Seizures (HCC) ~ 2016; ~ 2017   "I've had a couple; I was at work" (12/20/2017)  . 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 Syncope and collapse    "last few days" (12/20/2017)    Family History  Problem Relation Age of Onset  . Heart attack Other   . Diabetes Neg Hx     Past Surgical History:  Procedure Laterality Date  . CARDIAC CATHETERIZATION    . CORONARY ARTERY BYPASS GRAFT N/A 12/07/2017   Procedure: CORONARY ARTERY BYPASS GRAFTING (CABG) times four using left internal mammary artery and right saphenous vein using endoscope for harvest.;  Surgeon: [name], MD;  Location: [facility] OR;  Service: Open Heart Surgery;  Laterality: N/A;  . I & D EXTREMITY Left 05/20/2020   Procedure: IRRIGATION AND DEBRIDEMENT OF WRIST;  Surgeon: Ernest Mallick, MD;  Location: [facility] OR;  Service: Orthopedics;  Laterality: Left;  . IABP INSERTION N/A 11/29/2017   Procedure: IABP Insertion;  Surgeon: [name], MD;  Location: [facility] INVASIVE CV LAB;  Service: Cardiovascular;  Laterality: N/A;  . IABP INSERTION N/A 12/03/2017   Procedure: IABP INSERTION;  Surgeon: [name], MD;  Location: [facility] INVASIVE  CV LAB;  Service: Cardiovascular;  Laterality: N/A;  . ICD IMPLANT N/A 08/29/2019   Procedure: ICD IMPLANT;  Surgeon: Marinus Maw, MD;  Location: [facility] INVASIVE CV LAB;  Service: Cardiovascular;  Laterality: N/A;  . LEFT HEART CATH AND CORONARY ANGIOGRAPHY N/A 11/29/2017   Procedure: LEFT HEART CATH AND CORONARY ANGIOGRAPHY;  Surgeon: [name], MD;  Location: [facility] INVASIVE CV LAB;  Service: Cardiovascular;  Laterality: N/A;  . RIGHT HEART CATH N/A 11/29/2017   Procedure: RIGHT HEART CATH;  Surgeon: [name], MD;  Location: [facility] INVASIVE CV LAB;  Service: Cardiovascular;  Laterality: N/A;  . TEE WITHOUT CARDIOVERSION N/A 12/07/2017   Procedure: TRANSESOPHAGEAL ECHOCARDIOGRAM (TEE);  Surgeon: Donata Clay, Theron Arista, MD;  Location: [facility] OR;  Service: Open Heart Surgery;  Laterality: N/A;  . TONSILLECTOMY AND ADENOIDECTOMY  ~ 1972  Social History   Occupational History  . Not on file  Tobacco Use  . Smoking status: Never Smoker  . Smokeless tobacco: Never Used  Vaping Use  . Vaping Use: Never used  Substance and Sexual Activity  . Alcohol use: Yes    Alcohol/week: 14.0 standard drinks    Types: 14 Cans of beer per week    Comment: patient endorses at least 1-2 beers daily (04/2020)  . Drug use: No  . Sexual activity: Not Currently

## 2021-02-08 ENCOUNTER — Other Ambulatory Visit (HOSPITAL_COMMUNITY): Payer: Self-pay

## 2021-02-08 NOTE — Progress Notes (Signed)
Paramedicine Encounter    Patient ID: [name], male    DOB: [birth year], 60 y.o.   MRN: [id number]   Patient Care Team: Claiborne Rigg, NP as PCP - General (Nurse Practitioner) Marinus Maw, MD as PCP - Electrophysiology (Cardiology)  Patient Active Problem List   Diagnosis Date Noted  . Closed fracture of right upper limb 01/27/2021  . Pneumonia due to COVID-19 virus 08/14/2020  . Hyponatremia 08/14/2020  . Elevated AST (SGOT) 08/14/2020  . Prolonged QT interval 08/14/2020  . Cardiac arrest, cause unspecified (HCC) 07/06/2020  . Left arm cellulitis 05/19/2020  . Essential hypertension 05/18/2020  . Mixed hyperlipidemia 05/18/2020  . Alcohol abuse 05/18/2020  . Cellulitis of left upper extremity 05/18/2020  . Lactic acidosis 05/18/2020  . 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 ICD (implantable cardioverter-defibrillator) in place 08/29/2019  . Chronic systolic heart failure (HCC) 08/25/2019  . 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 Coronary artery disease involving native coronary artery of native heart without angina pectoris 12/07/2017    Current Outpatient Medications:  .  albuterol (VENTOLIN HFA) 108 (90 Base) MCG/ACT inhaler, Inhale 2 puffs into the lungs every 6 (six) hours as needed for wheezing or shortness of breath., Disp: 6.7 g, Rfl: 0 .  atorvastatin (LIPITOR) 80 MG tablet, Take 1 tablet (80 mg total) by mouth daily at 6 PM., Disp: 90 tablet, Rfl: 3 .  carvedilol (COREG) 3.125 MG tablet, Take 1 tablet (3.125 mg total) by mouth 2 (two) times daily with a meal., Disp: 60 tablet, Rfl: 4 .  cetirizine (ZYRTEC) 10 MG tablet, Take 1 tablet (10 mg total) by mouth daily., Disp: 30 tablet, Rfl: 11 .  eplerenone (INSPRA) 25 MG tablet, Take 1 tablet (25 mg total) by mouth daily., Disp: 30 tablet, Rfl: 3 .  ezetimibe (ZETIA) 10 MG tablet, Take 1 tablet (10 mg total) by mouth daily., Disp: 30  tablet, Rfl: 6 .  guaiFENesin (MUCINEX) 600 MG 12 hr tablet, Take 1 tablet (600 mg total) by mouth 2 (two) times daily. (Patient not taking: Reported on 02/01/2021), Disp: 60 tablet, Rfl: 2 .  ivabradine (CORLANOR) 5 MG TABS tablet, Take 1 tablet (5 mg total) by mouth 2 (two) times daily with a meal., Disp: 30 tablet, Rfl: 3 .  levETIRAcetam (KEPPRA) 500 MG tablet, TAKE 1 TABLET BY MOUTH 2 TIMES DAILY, Disp: 180 tablet, Rfl: 3 .  sacubitril-valsartan (ENTRESTO) 24-26 MG, Take 1 tablet by mouth 2 (two) times daily., Disp: 60 tablet, Rfl: 6 Allergies  Allergen Reactions  . Spironolactone Other (See Comments)    Gynecomastia      Social History   Socioeconomic History  . Marital status: Divorced    Spouse name: Not on file  . Number of children: Not on file  . Years of education: Not on file  . Highest education level: Not on file  Occupational History  . Not on file  Tobacco Use  . Smoking status: Never Smoker  . Smokeless tobacco: Never Used  Vaping Use  . Vaping Use: Never used  Substance and Sexual Activity  . Alcohol use: Yes    Alcohol/week: 14.0 standard drinks    Types: 14 Cans of beer per week    Comment: patient endorses at least 1-2 beers daily (04/2020)  . Drug use: No  . Sexual activity: Not Currently  Other Topics Concern  . Not on file  Social History Narrative  Lives at home with his brother who is crippled. He takes care of his brother.    Right handed    Social Determinants of Health   Financial Resource Strain: High Risk  . Difficulty of Paying Living Expenses: Hard  Food Insecurity: No Food Insecurity  . Worried About Programme researcher, broadcasting/film/video in the Last Year: Never true  . Ran Out of Food in the Last Year: Never true  Transportation Needs: No Transportation Needs  . Lack of Transportation (Medical): No  . Lack of Transportation (Non-Medical): No  Physical Activity: Not on file  Stress: Not on file  Social Connections: Not on file  Intimate Partner  Violence: Not on file    Physical Exam Vitals reviewed.  Constitutional:      Appearance: Normal appearance. He is normal weight.  HENT:     Head: Normocephalic.     Nose: Congestion and rhinorrhea present.     Mouth/Throat:     Mouth: Mucous membranes are moist.     Pharynx: Oropharynx is clear.  Eyes:     Conjunctiva/sclera: Conjunctivae normal.     Pupils: Pupils are equal, round, and reactive to light.  Cardiovascular:     Rate and Rhythm: Normal rate and regular rhythm.     Pulses: Normal pulses.     Heart sounds: Normal heart sounds.  Pulmonary:     Effort: Pulmonary effort is normal.     Breath sounds: Normal breath sounds.  Abdominal:     General: Abdomen is flat.     Palpations: Abdomen is soft.  Musculoskeletal:        General: Normal range of motion.     Cervical back: Normal range of motion.     Right lower leg: No edema.     Left lower leg: No edema.  Skin:    General: Skin is warm and dry.     Capillary Refill: Capillary refill takes less than 2 seconds.  Neurological:     General: No focal deficit present.     Mental Status: He is alert. Mental status is at baseline.  Psychiatric:        Mood and Affect: Mood normal.        Behavior: Behavior normal.        Thought Content: Thought content normal.        Judgment: Judgment normal.     Arrived for home visit for [facility] [place] who was alert and oriented reporting to be feeling okay aside from arm pain. [name] was seen by [facility] yesterday where he was placed in a soft/hard cast and is required to follow up in 3 weeks. I will schedule same. No swelling, edema noted. [name] still has existing cough with mucus production and sinus drainage. [name] has yet to pick up prescribed medications at pharmacy for same. I will pick up medications next week for patient. [name]'s vitals were obtained and are as noted. Medications were reviewed, [name] only took two morning doses and one evening dose out of last weeks pill box.  Medications confirmed. pillbox filled accordingly. [name] educated on medication compliance. He agreed and understood same. I plan to see [name] in one week. I will follow up as needed. Home visit complete.   Refills:  Corlanor Inspra Zetia     Future Appointments  Date Time Provider Department Center  03/23/2021  7:25 AM [facility] DEVICE REMOTES [facility]  04/27/2021 10:00 AM [name], Bevelyn Buckles, MD [facility] None  06/22/2021  7:25 AM [facility] DEVICE  REMOTES [facility]  09/21/2021  7:25 AM [facility] DEVICE REMOTES [facility]  12/21/2021  7:25 AM [facility] DEVICE REMOTES [facility]     ACTION: Home visit completed Next visit planned for one week

## 2021-02-09 ENCOUNTER — Other Ambulatory Visit (HOSPITAL_COMMUNITY): Payer: Self-pay | Admitting: *Deleted

## 2021-02-09 ENCOUNTER — Telehealth (HOSPITAL_COMMUNITY): Payer: Self-pay | Admitting: Licensed Clinical Social Worker

## 2021-02-09 DIAGNOSIS — E782 Mixed hyperlipidemia: Secondary | ICD-10-CM

## 2021-02-09 MED ORDER — EPLERENONE 25 MG PO TABS: 25.0000 mg | ORAL_TABLET | Freq: Every day | ORAL | 3 refills | Status: DC

## 2021-02-09 MED ORDER — EZETIMIBE 10 MG PO TABS: 10.0000 mg | ORAL_TABLET | Freq: Every day | ORAL | 6 refills | Status: DC

## 2021-02-09 NOTE — Telephone Encounter (Signed)
CSW received call from pt brother Arlys John yesterday stating that he was able to get a phone so if we need to contact [place] we can call Brian's phone.  Added new contact number to the chart.  Will continue to follow and assist as needed  Burna Sis, LCSW Clinical Social Worker [facility] Desk#: [phone] Cell#: [phone]

## 2021-02-09 NOTE — Telephone Encounter (Signed)
CSW received VM from pt sister stating that pt is getting evicted.  CSW called pt landlord, Efraim Kaufmann, to discuss.  Melissa states that pt is not getting evicted but that they are not going to renew their lease.  States they plan to give pt notice on March 1 with intention of them having to move out by March 31st.  CSW has already turned in [employer] application for their properties as well as [place] apartment site.  Pt is number 36 on [facility] low rent waitlist.  CSW called multiple properties in [facility] to inquire about availability- left VMs with properties to inquire about requirements and availability.  Was able to place pt on waitlist for [facility] apartments but there is approximately 60 people ahead of him  CSW spoke with pt and his brother and explained the situation.  Encouraged them to make looking for housing their #1 priority at this time.  CSW will expand search to other counties  Will continue to follow and assist as needed  Burna Sis, LCSW Clinical Social Worker [facility] Desk#: [phone] Cell#: [phone]

## 2021-02-14 ENCOUNTER — Telehealth (HOSPITAL_COMMUNITY): Payer: Self-pay | Admitting: Licensed Clinical Social Worker

## 2021-02-14 ENCOUNTER — Telehealth (HOSPITAL_COMMUNITY): Payer: Self-pay

## 2021-02-14 NOTE — Telephone Encounter (Signed)
CSW set up Medicaid transport to ortho follow up appt on 3/10.  Pick up time from home: 1:45pm Pick up time from appt: 3:45pm Confirmation #: [id number]  Will continue to follow and assist as needed  Burna Sis, LCSW Clinical Social Worker [facility] Desk#: [phone] Cell#: [phone]

## 2021-02-14 NOTE — Telephone Encounter (Signed)
Spoke to [facility] and scheduled follow up for [employer]. Appointment is 3/10 at 2:45. Transportation will be set up by [employer].

## 2021-02-15 ENCOUNTER — Telehealth (HOSPITAL_COMMUNITY): Payer: Self-pay | Admitting: Licensed Clinical Social Worker

## 2021-02-15 NOTE — Telephone Encounter (Signed)
CSW called pt brother and informed that Scientist, research (medical) would be unable to come for home visit today.  CSW will continue to follow and assist as needed  Burna Sis, LCSW Clinical Social Worker [facility] Desk#: [phone] Cell#: [phone]

## 2021-02-22 ENCOUNTER — Other Ambulatory Visit (HOSPITAL_COMMUNITY): Payer: Self-pay

## 2021-02-22 NOTE — Progress Notes (Signed)
Paramedicine Encounter    Patient ID: [name], male    DOB: January 15, 1961, 60 y.o.   MRN: [id number]   Patient Care Team: Claiborne Rigg, NP as PCP - General (Nurse Practitioner) Marinus Maw, MD as PCP - Electrophysiology (Cardiology)  Patient Active Problem List   Diagnosis Date Noted  . Closed fracture of right upper limb 01/27/2021  . Pneumonia due to COVID-19 virus 08/14/2020  . Hyponatremia 08/14/2020  . Elevated AST (SGOT) 08/14/2020  . Prolonged QT interval 08/14/2020  . Cardiac arrest, cause unspecified (HCC) 07/06/2020  . Left arm cellulitis 05/19/2020  . Essential hypertension 05/18/2020  . Mixed hyperlipidemia 05/18/2020  . Alcohol abuse 05/18/2020  . Cellulitis of left upper extremity 05/18/2020  . Lactic acidosis 05/18/2020  . 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 ICD (implantable cardioverter-defibrillator) in place 08/29/2019  . Chronic systolic heart failure (HCC) 08/25/2019  . 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 Coronary artery disease involving native coronary artery of native heart without angina pectoris 12/07/2017    Current Outpatient Medications:  .  albuterol (VENTOLIN HFA) 108 (90 Base) MCG/ACT inhaler, Inhale 2 puffs into the lungs every 6 (six) hours as needed for wheezing or shortness of breath. (Patient not taking: Reported on 02/08/2021), Disp: 6.7 g, Rfl: 0 .  atorvastatin (LIPITOR) 80 MG tablet, Take 1 tablet (80 mg total) by mouth daily at 6 PM., Disp: 90 tablet, Rfl: 3 .  carvedilol (COREG) 3.125 MG tablet, Take 1 tablet (3.125 mg total) by mouth 2 (two) times daily with a meal., Disp: 60 tablet, Rfl: 4 .  cetirizine (ZYRTEC) 10 MG tablet, Take 1 tablet (10 mg total) by mouth daily. (Patient not taking: Reported on 02/08/2021), Disp: 30 tablet, Rfl: 11 .  eplerenone (INSPRA) 25 MG tablet, Take 1 tablet (25 mg total) by mouth daily., Disp: 30 tablet, Rfl: 3 .   ezetimibe (ZETIA) 10 MG tablet, Take 1 tablet (10 mg total) by mouth daily., Disp: 30 tablet, Rfl: 6 .  guaiFENesin (MUCINEX) 600 MG 12 hr tablet, Take 1 tablet (600 mg total) by mouth 2 (two) times daily. (Patient not taking: No sig reported), Disp: 60 tablet, Rfl: 2 .  ivabradine (CORLANOR) 5 MG TABS tablet, Take 1 tablet (5 mg total) by mouth 2 (two) times daily with a meal., Disp: 30 tablet, Rfl: 3 .  levETIRAcetam (KEPPRA) 500 MG tablet, TAKE 1 TABLET BY MOUTH 2 TIMES DAILY, Disp: 180 tablet, Rfl: 3 .  sacubitril-valsartan (ENTRESTO) 24-26 MG, Take 1 tablet by mouth 2 (two) times daily., Disp: 60 tablet, Rfl: 6 Allergies  Allergen Reactions  . Spironolactone Other (See Comments)    Gynecomastia      Social History   Socioeconomic History  . Marital status: Divorced    Spouse name: Not on file  . Number of children: Not on file  . Years of education: Not on file  . Highest education level: Not on file  Occupational History  . Not on file  Tobacco Use  . Smoking status: Never Smoker  . Smokeless tobacco: Never Used  Vaping Use  . Vaping Use: Never used  Substance and Sexual Activity  . Alcohol use: Yes    Alcohol/week: 14.0 standard drinks    Types: 14 Cans of beer per week    Comment: patient endorses at least 1-2 beers daily (04/2020)  . Drug use: No  . Sexual activity: Not Currently  Other  Topics Concern  . Not on file  Social History Narrative   Lives at home with his brother who is crippled. He takes care of his brother.    Right handed    Social Determinants of Health   Financial Resource Strain: High Risk  . Difficulty of Paying Living Expenses: Hard  Food Insecurity: No Food Insecurity  . Worried About Programme researcher, broadcasting/film/video in the Last Year: Never true  . Ran Out of Food in the Last Year: Never true  Transportation Needs: No Transportation Needs  . Lack of Transportation (Medical): No  . Lack of Transportation (Non-Medical): No  Physical Activity: Not on file   Stress: Not on file  Social Connections: Not on file  Intimate Partner Violence: Not on file    Physical Exam Vitals reviewed.  Constitutional:      Appearance: He is normal weight.  HENT:     Head: Normocephalic.     Nose: Nose normal.     Mouth/Throat:     Mouth: Mucous membranes are moist.     Pharynx: Oropharynx is clear.  Eyes:     Conjunctiva/sclera: Conjunctivae normal.     Pupils: Pupils are equal, round, and reactive to light.  Cardiovascular:     Rate and Rhythm: Normal rate and regular rhythm.     Pulses: Normal pulses.     Heart sounds: Normal heart sounds.  Pulmonary:     Effort: Pulmonary effort is normal.     Breath sounds: Normal breath sounds.  Abdominal:     General: Abdomen is flat.     Palpations: Abdomen is soft.  Musculoskeletal:        General: Normal range of motion.     Cervical back: Normal range of motion.     Right lower leg: No edema.     Left lower leg: No edema.  Skin:    General: Skin is warm and dry.     Capillary Refill: Capillary refill takes less than 2 seconds.  Neurological:     General: No focal deficit present.     Mental Status: He is alert. Mental status is at baseline.  Psychiatric:        Mood and Affect: Mood normal.     Arrived for home visit for [place] who was alert and oriented standing on the front porch. Darl reports feeling okay aside from his left arm injury which is still in the cast. Herald has a follow up with [facility] on 3/10. [name] given appointment information and pick up time for transportation. Demarious agreed with same. I obtained vitals and they are as noted. Adryel denied any dizziness, shortness of breath or chest pain. Corley's medication pill box was full on the evening doses and missing a few morning doses. He reports he filled it himself last week during my absence. Same was checked for accuracy and filled the rest of the way. Elber was asked about housing and he reports he is working on same with  some friends of his seeking out some places to live before the end of the month due to the denial of lease renewal by the landlord. Raymundo was encouraged to work hard on this and he agreed with plan. Home visit complete. I will see Parth in one week.   Tayron called me later today with new phone information. [phone]     Future Appointments  Date Time Provider Department Center  03/03/2021  2:45 PM Kirtland Bouchard, PA-C [facility] None  03/23/2021  7:25 AM [facility] DEVICE REMOTES [facility]  04/27/2021 10:00 AM [name], Bevelyn Buckles, MD [facility] None  06/22/2021  7:25 AM [facility] DEVICE REMOTES [facility]  09/21/2021  7:25 AM [facility] DEVICE REMOTES [facility]  12/21/2021  7:25 AM [facility] DEVICE REMOTES [facility]     ACTION: Home visit completed Next visit planned for one week

## 2021-02-23 ENCOUNTER — Telehealth (HOSPITAL_COMMUNITY): Payer: Self-pay | Admitting: Licensed Clinical Social Worker

## 2021-02-23 NOTE — Progress Notes (Signed)
Heart and Vascular Care Navigation  02/23/2021  [name] October 19, 1961 [id number]  Reason for Referral: CSW following patient for housing concerns.  Pt lease ending on 03/24/21 and patient does not have anywhere to go at this time.                                                                                                    Assessment:   CSW has been working on finding apartment sites in [place] area but have been unable to find at this time.  CSW expanding search to surrounding counties.  Working on Landscape architect to [employer].    CSW called pt ensure him and his brother have necessary documentation to move forward with applying for housing.  Pt reports he has an ID but no SS card- pt brother also doesn't have SS card.  Pt also doesn't have SSI awards statement.  CSW assisted pt in calling SSA and requested SSI statement be mailed to the house.  Inquired about getting new SSI card- states that application to get new card would be quickest through online account.  CSW assisted pt in starting to sign up for online account but pt has to respond to confirmation email from [facility] before we can proceed- email pt provided didn't work- he will try to reset everything this evening and text csw the details.  CSW also left message for [facility] requesting copy of food stamp award letter.                            HRT/VAS Care Coordination    Living arrangements for the past 2 months Single Family Home   Lives with: Siblings  Pt lives with his brother.   Home Assistive Devices/Equipment Cane (specify quad or straight); Walker (specify type)   DME Agency [employer]   [facility] Agency [facility]      Social History:                                                                             SDOH Screenings   Alcohol Screen: Not on file  Depression (PHQ2-9): Low Risk   . PHQ-2 Score: 0  Financial Resource Strain: High Risk  . Difficulty of Paying Living Expenses: Hard   Food Insecurity: No Food Insecurity  . Worried About Programme researcher, broadcasting/film/video in the Last Year: Never true  . Ran Out of Food in the Last Year: Never true  Housing: High Risk  . Last Housing Risk Score: 2  Physical Activity: Not on file  Social Connections: Not on file  Stress: Not on file  Tobacco Use: Low Risk   . Smoking Tobacco Use: Never Smoker  . Smokeless Tobacco Use: Never Used  Transportation Needs: No  Transportation Needs  . Lack of Transportation (Medical): No  . Lack of Transportation (Non-Medical): No    SDOH Interventions: Financial Resources:  Financial Strain Interventions:  (paid for medication copays with patient care fund)   Food Insecurity:   none reported at this time- gets food stamps  Housing Insecurity:  Housing Interventions: Other (Comment) (helping apply for housing)  Transportation:     uses [facility] transport/ medicaid transport   Follow-up plan:     CSW to continue to work with pt to get necessary documentation to apply for housing as well as turn in necessary applications.  Will continue to follow and assist as needed  Burna Sis, LCSW Clinical Social Worker [facility] Desk#: [phone] Cell#: [phone]

## 2021-02-25 ENCOUNTER — Telehealth (HOSPITAL_COMMUNITY): Payer: Self-pay | Admitting: Licensed Clinical Social Worker

## 2021-02-25 NOTE — Progress Notes (Signed)
Heart and Vascular Care Navigation  02/25/2021  [name] Oct 22, 1961 [id number]                                                                                                    Assessment:  CSW has been working with pt to help find alternative housing as pt is being asked to leave his residence at the end of March.  CSW has been helping to find applications but they all require supporting documentation that patient does not have.  CSW assisted pt in creating online SSA account so we could order a new SSA card.  Account created but they are mailing a confirmation letter to his house before we can finalize his account- should arrive in 5-15 days.    CSW also assisted pt in calling EBT to get a statement sent to his home- being mailed today and anticipate will take 7 business days                                 HRT/VAS Care Coordination    Outpatient Care Team Community ACT Team; Social Worker   Community ACT Team Member Name: [name]- [phone]   Social Worker Name: [name]- [facility]- [phone]   Living arrangements for the past 2 months Single Family Home   Lives with: Siblings  Pt lives with his brother.   Patient Current Insurance Coverage Medicaid   Patient Has Concern With Paying Medical Bills No   Does Patient Have Prescription Coverage? Yes   Home Assistive Devices/Equipment Cane (specify quad or straight); Walker (specify type)   DME Agency [employer]   [facility]      Social History:                                                                             SDOH Screenings   Alcohol Screen: Not on file  Depression (PHQ2-9): Low Risk   . PHQ-2 Score: 0  Financial Resource Strain: High Risk  . Difficulty of Paying Living Expenses: Hard  Food Insecurity: No Food Insecurity  . Worried About Programme researcher, broadcasting/film/video in the Last Year: Never true  . Ran Out of Food in the Last Year: Never true  Housing: High  Risk  . Last Housing Risk Score: 2  Physical Activity: Not on file  Social Connections: Not on file  Stress: Not on file  Tobacco Use: Low Risk   . Smoking Tobacco Use: Never Smoker  . Smokeless Tobacco Use: Never Used  Transportation Needs: No Transportation Needs  . Lack of Transportation (Medical): No  . Lack of Transportation (Non-Medical): No    SDOH Interventions: Financial Resources:   not addressed during this  call  Food Insecurity:    not addressed during this call  Housing Insecurity:  Housing Interventions: Other (Comment) (assisted in getting required documents for housing applications (SSA card, food stamp awards, SSI benefit statement))  Transportation:     not addressed during this call    Follow-up plan:    Pt to work on getting 6 months of banking statements and will call CSW when he gets the required letters in the mail.  CSW will continue to work on finding alternative housing options and assist as needed  Burna Sis, LCSW Clinical Social Worker [facility] Desk#: [phone] Cell#: [phone]

## 2021-02-28 ENCOUNTER — Telehealth (HOSPITAL_COMMUNITY): Payer: Self-pay | Admitting: Licensed Clinical Social Worker

## 2021-02-28 NOTE — Telephone Encounter (Signed)
CSW called [facility] Medicaid transport and updated pt new phone number with them ahead of ride scheduled for ortho appt this Thursday- number updated in their system so they can get a hold of him  Will continue to follow and assist as needed  Burna Sis, LCSW Clinical Social Worker [facility] Desk#: [phone] Cell#: [phone]

## 2021-03-01 ENCOUNTER — Other Ambulatory Visit (HOSPITAL_COMMUNITY): Payer: Self-pay

## 2021-03-01 NOTE — Progress Notes (Signed)
Paramedicine Encounter    Patient ID: [name], male    DOB: 1961-02-22, 60 y.o.   MRN: [id number]   Patient Care Team: Claiborne Rigg, NP as PCP - General (Nurse Practitioner) Marinus Maw, MD as PCP - Electrophysiology (Cardiology)  Patient Active Problem List   Diagnosis Date Noted  . Closed fracture of right upper limb 01/27/2021  . Pneumonia due to COVID-19 virus 08/14/2020  . Hyponatremia 08/14/2020  . Elevated AST (SGOT) 08/14/2020  . Prolonged QT interval 08/14/2020  . Cardiac arrest, cause unspecified (HCC) 07/06/2020  . Left arm cellulitis 05/19/2020  . Essential hypertension 05/18/2020  . Mixed hyperlipidemia 05/18/2020  . Alcohol abuse 05/18/2020  . Cellulitis of left upper extremity 05/18/2020  . Lactic acidosis 05/18/2020  . 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 ICD (implantable cardioverter-defibrillator) in place 08/29/2019  . Chronic systolic heart failure (HCC) 08/25/2019  . 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 Coronary artery disease involving native coronary artery of native heart without angina pectoris 12/07/2017    Current Outpatient Medications:  .  albuterol (VENTOLIN HFA) 108 (90 Base) MCG/ACT inhaler, Inhale 2 puffs into the lungs every 6 (six) hours as needed for wheezing or shortness of breath. (Patient not taking: No sig reported), Disp: 6.7 g, Rfl: 0 .  atorvastatin (LIPITOR) 80 MG tablet, Take 1 tablet (80 mg total) by mouth daily at 6 PM., Disp: 90 tablet, Rfl: 3 .  carvedilol (COREG) 3.125 MG tablet, Take 1 tablet (3.125 mg total) by mouth 2 (two) times daily with a meal., Disp: 60 tablet, Rfl: 4 .  cetirizine (ZYRTEC) 10 MG tablet, Take 1 tablet (10 mg total) by mouth daily. (Patient not taking: No sig reported), Disp: 30 tablet, Rfl: 11 .  eplerenone (INSPRA) 25 MG tablet, Take 1 tablet (25 mg total) by mouth daily., Disp: 30 tablet, Rfl: 3 .  ezetimibe  (ZETIA) 10 MG tablet, Take 1 tablet (10 mg total) by mouth daily., Disp: 30 tablet, Rfl: 6 .  guaiFENesin (MUCINEX) 600 MG 12 hr tablet, Take 1 tablet (600 mg total) by mouth 2 (two) times daily. (Patient not taking: No sig reported), Disp: 60 tablet, Rfl: 2 .  ivabradine (CORLANOR) 5 MG TABS tablet, Take 1 tablet (5 mg total) by mouth 2 (two) times daily with a meal., Disp: 30 tablet, Rfl: 3 .  levETIRAcetam (KEPPRA) 500 MG tablet, TAKE 1 TABLET BY MOUTH 2 TIMES DAILY, Disp: 180 tablet, Rfl: 3 .  sacubitril-valsartan (ENTRESTO) 24-26 MG, Take 1 tablet by mouth 2 (two) times daily., Disp: 60 tablet, Rfl: 6 Allergies  Allergen Reactions  . Spironolactone Other (See Comments)    Gynecomastia      Social History   Socioeconomic History  . Marital status: Divorced    Spouse name: Not on file  . Number of children: Not on file  . Years of education: Not on file  . Highest education level: Not on file  Occupational History  . Not on file  Tobacco Use  . Smoking status: Never Smoker  . Smokeless tobacco: Never Used  Vaping Use  . Vaping Use: Never used  Substance and Sexual Activity  . Alcohol use: Yes    Alcohol/week: 14.0 standard drinks    Types: 14 Cans of beer per week    Comment: patient endorses at least 1-2 beers daily (04/2020)  . Drug use: No  . Sexual activity: Not Currently  Other  Topics Concern  . Not on file  Social History Narrative   Lives at home with his brother who is crippled. He takes care of his brother.    Right handed    Social Determinants of Health   Financial Resource Strain: High Risk  . Difficulty of Paying Living Expenses: Hard  Food Insecurity: No Food Insecurity  . Worried About Programme researcher, broadcasting/film/video in the Last Year: Never true  . Ran Out of Food in the Last Year: Never true  Transportation Needs: No Transportation Needs  . Lack of Transportation (Medical): No  . Lack of Transportation (Non-Medical): No  Physical Activity: Not on file  Stress:  Not on file  Social Connections: Not on file  Intimate Partner Violence: Not on file    Physical Exam Vitals reviewed.  Constitutional:      Appearance: Normal appearance. He is normal weight.  HENT:     Head: Normocephalic.     Nose: Nose normal.     Mouth/Throat:     Mouth: Mucous membranes are moist.     Pharynx: Oropharynx is clear.  Eyes:     Conjunctiva/sclera: Conjunctivae normal.     Pupils: Pupils are equal, round, and reactive to light.  Cardiovascular:     Rate and Rhythm: Normal rate and regular rhythm.     Pulses: Normal pulses.     Heart sounds: Normal heart sounds.  Pulmonary:     Effort: Pulmonary effort is normal.     Breath sounds: Normal breath sounds.  Abdominal:     General: Abdomen is flat.     Palpations: Abdomen is soft.  Musculoskeletal:        General: Normal range of motion.     Right lower leg: No edema.     Left lower leg: No edema.  Skin:    General: Skin is warm and dry.     Capillary Refill: Capillary refill takes less than 2 seconds.  Neurological:     General: No focal deficit present.     Mental Status: He is alert. Mental status is at baseline.  Psychiatric:        Mood and Affect: Mood normal.     Arrived for home visit for [facility] who was alert and oriented reporting he was feeling good. Anders denied dizziness, chest pain, shortness of breath. Adyn has been compliant with his medications over the last week. Vitals were obtained and are as noted. Aeric has no edema, lung sounds clear with no JVD, abdomen soft no distention. Meds were reviewed and confirmed. Pillbox filled accordingly. Monica reminded of Ortho appointment for Thursday with pickup time 1:45. Eliezer agreed and understood. Home visit complete.    Refills: NONE    Future Appointments  Date Time Provider Department Center  03/03/2021  2:45 PM Kirtland Bouchard, PA-C [facility] None  03/23/2021  7:25 AM [facility] DEVICE REMOTES [facility]  04/27/2021 10:00  AM [name], Bevelyn Buckles, MD [facility] None  06/22/2021  7:25 AM [facility] DEVICE REMOTES [facility]  09/21/2021  7:25 AM [facility]ICE [facility]  12/21/2021  7:25 AM [facility]     ACTION: Home visit completed Next visit planned for one week

## 2021-03-03 ENCOUNTER — Encounter: Payer: Self-pay | Admitting: Physician Assistant

## 2021-03-03 ENCOUNTER — Ambulatory Visit (INDEPENDENT_AMBULATORY_CARE_PROVIDER_SITE_OTHER): Payer: Medicaid Other

## 2021-03-03 ENCOUNTER — Ambulatory Visit (INDEPENDENT_AMBULATORY_CARE_PROVIDER_SITE_OTHER): Payer: Medicaid Other | Admitting: Physician Assistant

## 2021-03-03 DIAGNOSIS — S52602A Unspecified fracture of lower end of left ulna, initial encounter for closed fracture: Secondary | ICD-10-CM | POA: Diagnosis not present

## 2021-03-03 DIAGNOSIS — S52601A Unspecified fracture of lower end of right ulna, initial encounter for closed fracture: Secondary | ICD-10-CM

## 2021-03-03 NOTE — Progress Notes (Signed)
Office Visit Note   Patient: [name]           Date of Birth: [birth year]           MRN: [id number] Visit Date: 03/03/2021              Requested by: Claiborne Rigg, NP [address] [place],  [place] [address] PCP: Claiborne Rigg, NP   Assessment & Plan: Visit Diagnoses:  1. Closed fracture of distal end of right ulna, unspecified fracture morphology, initial encounter     Plan: He is placed in a forearm removable Velcro splint.  We will treat this like a cast only coming out of it for hygiene purposes.  Elevation wiggling fingers encouraged.  We will see him back in 1 month obtain 2 views of the left forearm at that time.  Follow-Up Instructions: Return in about 4 weeks (around 03/31/2021) for Radiographs.   Orders:  Orders Placed This Encounter  Procedures  . XR Forearm Left   No orders of the defined types were placed in this encounter.     Procedures: No procedures performed   Clinical Data: No additional findings.   Subjective: Chief Complaint  Patient presents with  . Left Arm - Pain    HPI [name] returns today she is 1 month status post left ulnar shaft fracture.  Patient is been in a cast.  He is overall doing well.  No concerns or complaints.  Review of Systems See HPI  Objective: Vital Signs: There were no vitals taken for this visit.  Physical Exam General well-developed well-nourished male no acute distress.  Ortho Exam Left hand full sensation full motion.  Slight swelling forearm compartments are soft and nontender.  He has palpable callus over the distal shaft of the ulna.  Remainder of the forearm is nontender good range of motion the elbow. Specialty Comments:  No specialty comments available.  Imaging: XR Forearm Left  Result Date: 03/03/2021 Left forearm 2 views: Bony callus formation seen at the ulnar shaft fracture.  Overall near anatomic alignment from AP from slight ulnar deviation .  Ulnar styloid fracture is  in good position.  No other acute fractures or bony abnormalities.    PMFS History: Patient Active Problem List   Diagnosis Date Noted  . Closed fracture of right upper limb 01/27/2021  . Pneumonia due to COVID-19 virus 08/14/2020  . Hyponatremia 08/14/2020  . Elevated AST (SGOT) 08/14/2020  . Prolonged QT interval 08/14/2020  . Cardiac arrest, cause unspecified (HCC) 07/06/2020  . Left arm cellulitis 05/19/2020  . Essential hypertension 05/18/2020  . Mixed hyperlipidemia 05/18/2020  . Alcohol abuse 05/18/2020  . Cellulitis of left upper extremity 05/18/2020  . Lactic acidosis 05/18/2020  . 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 ICD (implantable cardioverter-defibrillator) in place 08/29/2019  . Chronic systolic heart failure (HCC) 08/25/2019  . 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 Coronary artery disease involving native coronary artery of native heart without angina pectoris 12/07/2017   Past Medical History:  Diagnosis Date  . AICD (automatic cardioverter/defibrillator) present   . Alcohol abuse 05/18/2020  . Arthritis    "hands; maybe my toes" (12/20/2017)  . CAD (coronary artery disease)   . Cardiac arrest (HCC) 11/29/2017   hx VF arrest/notes 12/20/2017  . Chronic systolic heart failure (HCC) 08/25/2019  . Coronary artery disease   . Coronary artery disease involving native coronary artery  of native heart without angina pectoris 12/07/2017  . Essential hypertension 05/18/2020  . GERD (gastroesophageal reflux disease)   . H/O ETOH abuse    /notes 12/20/2017  . High cholesterol   . Hypertension   . Ischemic cardiomyopathy    a. 08/2019 s/p BSX D232 single lead AICD.  Marland Kitchen Mixed hyperlipidemia 05/18/2020  . Seizure (HCC) 10/21/2019  . Seizures (HCC) ~ 2016; ~ 2017   "I've had a couple; I was at work" (12/20/2017)  . 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, then to generalized  tonic-clonic seizures (HCC) 12/30/2019  . Syncope and collapse    "last few days" (12/20/2017)    Family History  Problem Relation Age of Onset  . Heart attack Other   . Diabetes Neg Hx     Past Surgical History:  Procedure Laterality Date  . CARDIAC CATHETERIZATION    . CORONARY ARTERY BYPASS GRAFT N/A 12/07/2017   Procedure: CORONARY ARTERY BYPASS GRAFTING (CABG) times four using left internal mammary artery and right saphenous vein using endoscope for harvest.;  Surgeon: [name], MD;  Location: [facility] OR;  Service: Open Heart Surgery;  Laterality: N/A;  . I & D EXTREMITY Left 05/20/2020   Procedure: IRRIGATION AND DEBRIDEMENT OF WRIST;  Surgeon: Ernest Mallick, MD;  Location: [facility] OR;  Service: Orthopedics;  Laterality: Left;  . IABP INSERTION N/A 11/29/2017   Procedure: IABP Insertion;  Surgeon: [name], MD;  Location: [facility] INVASIVE CV LAB;  Service: Cardiovascular;  Laterality: N/A;  . IABP INSERTION N/A 12/03/2017   Procedure: IABP INSERTION;  Surgeon: [name], MD;  Location: [facility] INVASIVE CV LAB;  Service: Cardiovascular;  Laterality: N/A;  . ICD IMPLANT N/A 08/29/2019   Procedure: ICD IMPLANT;  Surgeon: Marinus Maw, MD;  Location: [facility] INVASIVE CV LAB;  Service: Cardiovascular;  Laterality: N/A;  . LEFT HEART CATH AND CORONARY ANGIOGRAPHY N/A 11/29/2017   Procedure: LEFT HEART CATH AND CORONARY ANGIOGRAPHY;  Surgeon: [name], MD;  Location: [facility] INVASIVE CV LAB;  Service: Cardiovascular;  Laterality: N/A;  . RIGHT HEART CATH N/A 11/29/2017   Procedure: RIGHT HEART CATH;  Surgeon: [name], MD;  Location: [facility] INVASIVE CV LAB;  Service: Cardiovascular;  Laterality: N/A;  . TEE WITHOUT CARDIOVERSION N/A 12/07/2017   Procedure: TRANSESOPHAGEAL ECHOCARDIOGRAM (TEE);  Surgeon: Donata Clay, Theron Arista, MD;  Location[facility] OR;  Service: Open Heart Surgery;  Laterality: N/A;  . TONSILLECTOMY AND ADENOIDECTOMY  ~ 1972   Social History   Occupational History   . Not on file  Tobacco Use  . Smoking status: Never Smoker  . Smokeless tobacco: Never Used  Vaping Use  . Vaping Use: Never used  Substance and Sexual Activity  . Alcohol use: Yes    Alcohol/week: 14.0 standard drinks    Types: 14 Cans of beer per week    Comment: patient endorses at least 1-2 beers daily (04/2020)  . Drug use: No  . Sexual activity: Not Currently

## 2021-03-09 ENCOUNTER — Telehealth (HOSPITAL_COMMUNITY): Payer: Self-pay

## 2021-03-09 NOTE — Telephone Encounter (Signed)
Attempted to reach [place] in regards to retrieving some paperwork for [employer]. I drove by the home and picked it up and will hand deliver same to [place] on 3/16.    Shawan is doing well and has one pill box left until our next home visit. Johnpatrick had no complaints today and says he is doing okay and is going to be talking to his sister about housing options. I will see Esvin in one week.

## 2021-03-10 ENCOUNTER — Telehealth (HOSPITAL_COMMUNITY): Payer: Self-pay | Admitting: Licensed Clinical Social Worker

## 2021-03-10 NOTE — Telephone Encounter (Signed)
Pt received activation code from [facility] so we could sign into his account and request replacement SSA card.  SSA card requested- will hopefully mail out sometime this week  Will continue to follow and assist as needed  Burna Sis, LCSW Clinical Social Worker [facility] Desk#: [phone] Cell#: [phone]

## 2021-03-15 ENCOUNTER — Telehealth (HOSPITAL_COMMUNITY): Payer: Self-pay

## 2021-03-15 NOTE — Telephone Encounter (Signed)
Attempted to see [name] today however friends in the area report he wasn't home. Unable to reach [place] via phone due to him losing it. Waited 15-20 mins for his return with no success. I will attempt to see him next Tuesday.

## 2021-03-22 ENCOUNTER — Telehealth (HOSPITAL_COMMUNITY): Payer: Self-pay | Admitting: Licensed Clinical Social Worker

## 2021-03-22 ENCOUNTER — Other Ambulatory Visit (HOSPITAL_COMMUNITY): Payer: Self-pay

## 2021-03-22 NOTE — Progress Notes (Signed)
Paramedicine Encounter    Patient ID: [name], male    DOB: 1961-02-22, 60 y.o.   MRN: [id number]   Patient Care Team: Jose Rigg, NP as PCP - General (Nurse Practitioner) Jose Maw, MD as PCP - Electrophysiology (Cardiology)  Patient Active Problem List   Diagnosis Date Noted  . Closed fracture of right upper limb 01/27/2021  . Pneumonia due to COVID-19 virus 08/14/2020  . Hyponatremia 08/14/2020  . Elevated AST (SGOT) 08/14/2020  . Prolonged QT interval 08/14/2020  . Cardiac arrest, cause unspecified (HCC) 07/06/2020  . Left arm cellulitis 05/19/2020  . Essential hypertension 05/18/2020  . Mixed hyperlipidemia 05/18/2020  . Alcohol abuse 05/18/2020  . Cellulitis of left upper extremity 05/18/2020  . Lactic acidosis 05/18/2020  . 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 ICD (implantable cardioverter-defibrillator) in place 08/29/2019  . Chronic systolic heart failure (HCC) 08/25/2019  . 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 Coronary artery disease involving native coronary artery of native heart without angina pectoris 12/07/2017    Current Outpatient Medications:  .  albuterol (VENTOLIN HFA) 108 (90 Base) MCG/ACT inhaler, Inhale 2 puffs into the lungs every 6 (six) hours as needed for wheezing or shortness of breath. (Patient not taking: No sig reported), Disp: 6.7 g, Rfl: 0 .  atorvastatin (LIPITOR) 80 MG tablet, Take 1 tablet (80 mg total) by mouth daily at 6 PM., Disp: 90 tablet, Rfl: 3 .  carvedilol (COREG) 3.125 MG tablet, Take 1 tablet (3.125 mg total) by mouth 2 (two) times daily with a meal., Disp: 60 tablet, Rfl: 4 .  cetirizine (ZYRTEC) 10 MG tablet, Take 1 tablet (10 mg total) by mouth daily. (Patient not taking: No sig reported), Disp: 30 tablet, Rfl: 11 .  eplerenone (INSPRA) 25 MG tablet, Take 1 tablet (25 mg total) by mouth daily., Disp: 30 tablet, Rfl: 3 .  ezetimibe  (ZETIA) 10 MG tablet, Take 1 tablet (10 mg total) by mouth daily., Disp: 30 tablet, Rfl: 6 .  guaiFENesin (MUCINEX) 600 MG 12 hr tablet, Take 1 tablet (600 mg total) by mouth 2 (two) times daily. (Patient not taking: No sig reported), Disp: 60 tablet, Rfl: 2 .  ivabradine (CORLANOR) 5 MG TABS tablet, Take 1 tablet (5 mg total) by mouth 2 (two) times daily with a meal., Disp: 30 tablet, Rfl: 3 .  levETIRAcetam (KEPPRA) 500 MG tablet, TAKE 1 TABLET BY MOUTH 2 TIMES DAILY, Disp: 180 tablet, Rfl: 3 .  sacubitril-valsartan (ENTRESTO) 24-26 MG, Take 1 tablet by mouth 2 (two) times daily., Disp: 60 tablet, Rfl: 6 Allergies  Allergen Reactions  . Spironolactone Other (See Comments)    Gynecomastia      Social History   Socioeconomic History  . Marital status: Divorced    Spouse name: Not on file  . Number of children: Not on file  . Years of education: Not on file  . Highest education level: Not on file  Occupational History  . Not on file  Tobacco Use  . Smoking status: Never Smoker  . Smokeless tobacco: Never Used  Vaping Use  . Vaping Use: Never used  Substance and Sexual Activity  . Alcohol use: Yes    Alcohol/week: 14.0 standard drinks    Types: 14 Cans of beer per week    Comment: patient endorses at least 1-2 beers daily (04/2020)  . Drug use: No  . Sexual activity: Not Currently  Other  Topics Concern  . Not on file  Social History Narrative   Lives at home with his brother who is crippled. He takes care of his brother.    Right handed    Social Determinants of Health   Financial Resource Strain: High Risk  . Difficulty of Paying Living Expenses: Hard  Food Insecurity: No Food Insecurity  . Worried About Programme researcher, broadcasting/film/video in the Last Year: Never true  . Ran Out of Food in the Last Year: Never true  Transportation Needs: No Transportation Needs  . Lack of Transportation (Medical): No  . Lack of Transportation (Non-Medical): No  Physical Activity: Not on file  Stress:  Not on file  Social Connections: Not on file  Intimate Partner Violence: Not on file    Physical Exam Vitals reviewed.  Constitutional:      Appearance: Normal appearance. He is normal weight.  HENT:     Head: Normocephalic.     Nose: Nose normal.     Mouth/Throat:     Mouth: Mucous membranes are moist.     Pharynx: Oropharynx is clear.  Eyes:     Conjunctiva/sclera: Conjunctivae normal.     Pupils: Pupils are equal, round, and reactive to light.  Cardiovascular:     Rate and Rhythm: Normal rate and regular rhythm.     Pulses: Normal pulses.     Heart sounds: Normal heart sounds.  Pulmonary:     Effort: Pulmonary effort is normal.     Breath sounds: Normal breath sounds.  Abdominal:     General: Abdomen is flat.     Palpations: Abdomen is soft.  Musculoskeletal:        General: Normal range of motion.     Cervical back: Normal range of motion.     Right lower leg: No edema.     Left lower leg: No edema.  Skin:    General: Skin is warm and dry.     Capillary Refill: Capillary refill takes less than 2 seconds.  Neurological:     General: No focal deficit present.     Mental Status: He is alert. Mental status is at baseline.  Psychiatric:        Mood and Affect: Mood normal.        Behavior: Behavior normal.        Thought Content: Thought content normal.        Judgment: Judgment normal.    Arrived for home visit for [name] who was alert and oriented at home reporting to be feeling well with no complaints. [name] reports compliance with his medications over the last two weeks. Med box only had two days noted that he had not taken. I reviewed medications and filled two pill boxes accordingly. I obtained vitals and they are as noted. [name] reports he has had no chest pain, dizziness, shortness of breath. [name] was made aware of upcoming appointments and he reports understanding however housing conflicts will make it difficult for [place] to be able to schedule his  transportation. [name] knows to reach out to [place] as soon as he is aware of any housing opportunities.   I plan to see [name] in two weeks but will be assisting with setting up transportation for his [facility] appointment on 4/7.   Refills:  Carvedilol  Eplerenone Zetia Corlanor Keppra Entresto     Future Appointments  Date Time Provider Department Center  03/23/2021  7:25 AM [facility]ICE [facility]OTES [facility]  03/31/2021  1:15 PM  Kirtland Bouchard, PA-C [facility] None  04/27/2021 10:00 AM [name], Bevelyn Buckles, MD [facility] None  06/22/2021  7:25 AM [facility] DEVICE REMOTES [facility]  09/21/2021  7:25 AM [facility]ICE REMOTES [facility]  12/21/2021  7:25 AM [facility]     ACTION: Home visit completed Next visit planned for two weeks

## 2021-03-22 NOTE — Telephone Encounter (Signed)
Scientist, research (medical) out completing home visit today.  Pt will have to leave current housing situation on 3/31 and is not sure where he will be at this time- is planning to find housing with his "girlfriend".  Pt continues to have no phone but paramedic provided pt with CSW and paramedic phone numbers and he plans to get a new phone this week and call us to let us know where he is.Pt has been given a list of local shelters in case he is unable to find place to stay.    Pt has not received social security card yet in the mail.  CSW attempted to verify if it had been sent on SSA website but the verification was sent to pt email- email password was changed 1.5 weeks ago and pt does not remember changing it- thinks whoever stole his last phone must have.  CSW messaged pt current landlord to request they let us know if any mail comes from the [facility] after pt leaves.  Will continue to follow and assist as needed  Burna Sis, LCSW Clinical Social Worker [facility] Desk#: [phone] Cell#: [phone]

## 2021-03-24 ENCOUNTER — Other Ambulatory Visit (HOSPITAL_COMMUNITY): Payer: Self-pay | Admitting: *Deleted

## 2021-03-24 MED ORDER — CARVEDILOL 3.125 MG PO TABS: 3.1250 mg | ORAL_TABLET | Freq: Two times a day (BID) | ORAL | 4 refills | Status: DC

## 2021-03-24 MED ORDER — ENTRESTO 24-26 MG PO TABS: 1.0000 | ORAL_TABLET | Freq: Two times a day (BID) | ORAL | 6 refills | Status: DC

## 2021-03-24 MED ORDER — IVABRADINE HCL 5 MG PO TABS: 5.0000 mg | ORAL_TABLET | Freq: Two times a day (BID) | ORAL | 3 refills | Status: DC

## 2021-03-25 ENCOUNTER — Telehealth (HOSPITAL_COMMUNITY): Payer: Self-pay | Admitting: Licensed Clinical Social Worker

## 2021-03-25 NOTE — Telephone Encounter (Signed)
CSW received call from pt informing CSW that he had gotten a new phone- number changed in system.  Pt has not moved out of current residence despite being told the property was being taken back over by landlords on 03/24/21- States he plans to stay there until he can move into a new place that his girlfriend found for them in [place].  It is likely that landlord will begin eviction proceedings so CSW stressed importance of leaving ASAP. Pt is unsure of address of new place or when he can move in at this time.  Pt has appt next week but unsure where he will be at that time so CSW will plan to follow up with pt next week to determine location so we can help with transport.  Will continue to follow and assist as needed  Burna Sis, LCSW Clinical Social Worker [facility] Desk#: [phone] Cell#: [phone]

## 2021-03-29 ENCOUNTER — Telehealth (HOSPITAL_COMMUNITY): Payer: Self-pay | Admitting: Licensed Clinical Social Worker

## 2021-03-29 NOTE — Telephone Encounter (Signed)
CSW called pt to confirm he current address so we could set up transport to his ortho appt Thursday- unable to reach- left VM requesting return call  Will continue to follow and assist as needed  Burna Sis, LCSW Clinical Social Worker [facility] Desk#: [phone] Cell#: [phone]

## 2021-03-29 NOTE — Telephone Encounter (Signed)
CSW received call back from pt and confirmed he is still at the [address] location in [place] and plans to be there as of Thursday this week when he has his ortho appt.  CSW able to set up ride to appt:  Pick up from home: 12-12:30pm Pick up from appt: 2:15pm Confirmation #: [id number]  Pt states he is working on moving out and plans to move in with his girlfriend but doesn't have a timeline for this.  Will continue to follow and assist as needed  Burna Sis, LCSW Clinical Social Worker [facility] Desk#: [phone] Cell#: [phone]

## 2021-03-31 ENCOUNTER — Telehealth (HOSPITAL_COMMUNITY): Payer: Self-pay | Admitting: Licensed Clinical Social Worker

## 2021-03-31 ENCOUNTER — Ambulatory Visit: Payer: Medicaid Other | Admitting: Physician Assistant

## 2021-03-31 NOTE — Telephone Encounter (Signed)
CSW called pt to remind of ortho appt this afternoon.  Reminded pt regarding transport details.  Pt expressed understanding and will be awaiting transport.  Will continue to follow and assist as needed  Burna Sis, LCSW Clinical Social Worker [facility] Desk#: [phone] Cell#: [phone]

## 2021-04-04 ENCOUNTER — Telehealth (HOSPITAL_COMMUNITY): Payer: Self-pay

## 2021-04-04 ENCOUNTER — Telehealth (HOSPITAL_COMMUNITY): Payer: Self-pay | Admitting: Licensed Clinical Social Worker

## 2021-04-04 NOTE — Telephone Encounter (Signed)
Attempted to reach [name] in regards to our home visit for tomorrow. No answer on call. I left voicemail for [place] to return my call. Will follow up.

## 2021-04-04 NOTE — Telephone Encounter (Signed)
CSW received call from pt who informed CSW he did not make it to ortho appt last week as transport never arrived.  Spoke with [facility] Medicaid transport who states the transport was dropped last minute and they couldn't find someone else to transport in time.  CSW able to call ortho office and get pt rescheduled for Wednesday- called [facility] Medicaid transport and set up ride.  Pick up from home: 1:15-1:45pm Appt time : 2:30pm Pick up from appt: 3:30pm Confirmation #: [id number]  CSW updated pt regarding above.  Will continue to follow and assist as needed  Burna Sis, LCSW Clinical Social Worker [facility] Desk#: [phone] Cell#: [phone]

## 2021-04-05 ENCOUNTER — Other Ambulatory Visit (HOSPITAL_COMMUNITY): Payer: Self-pay

## 2021-04-05 NOTE — Progress Notes (Signed)
Paramedicine Encounter    Patient ID: [name], male    DOB: 1961-07-17, 60 y.o.   MRN: [id number]   Patient Care Team: Claiborne Rigg, NP as PCP - General (Nurse Practitioner) Marinus Maw, MD as PCP - Electrophysiology (Cardiology) Burna Sis, LCSW as Social Worker (Licensed Clinical Social Worker)  Patient Active Problem List   Diagnosis Date Noted  . Closed fracture of right upper limb 01/27/2021  . Pneumonia due to COVID-19 virus 08/14/2020  . Hyponatremia 08/14/2020  . Elevated AST (SGOT) 08/14/2020  . Prolonged QT interval 08/14/2020  . Cardiac arrest, cause unspecified (HCC) 07/06/2020  . Left arm cellulitis 05/19/2020  . Essential hypertension 05/18/2020  . Mixed hyperlipidemia 05/18/2020  . Alcohol abuse 05/18/2020  . Cellulitis of left upper extremity 05/18/2020  . Lactic acidosis 05/18/2020  . 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 ICD (implantable cardioverter-defibrillator) in place 08/29/2019  . Chronic systolic heart failure (HCC) 08/25/2019  . 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 Coronary artery disease involving native coronary artery of native heart without angina pectoris 12/07/2017    Current Outpatient Medications:  .  albuterol (VENTOLIN HFA) 108 (90 Base) MCG/ACT inhaler, Inhale 2 puffs into the lungs every 6 (six) hours as needed for wheezing or shortness of breath. (Patient not taking: No sig reported), Disp: 6.7 g, Rfl: 0 .  atorvastatin (LIPITOR) 80 MG tablet, Take 1 tablet (80 mg total) by mouth daily at 6 PM., Disp: 90 tablet, Rfl: 3 .  carvedilol (COREG) 3.125 MG tablet, Take 1 tablet (3.125 mg total) by mouth 2 (two) times daily with a meal., Disp: 60 tablet, Rfl: 4 .  cetirizine (ZYRTEC) 10 MG tablet, Take 1 tablet (10 mg total) by mouth daily. (Patient not taking: No sig reported), Disp: 30 tablet, Rfl: 11 .  eplerenone (INSPRA) 25 MG tablet, Take 1 tablet  (25 mg total) by mouth daily., Disp: 30 tablet, Rfl: 3 .  ezetimibe (ZETIA) 10 MG tablet, Take 1 tablet (10 mg total) by mouth daily., Disp: 30 tablet, Rfl: 6 .  guaiFENesin (MUCINEX) 600 MG 12 hr tablet, Take 1 tablet (600 mg total) by mouth 2 (two) times daily., Disp: 60 tablet, Rfl: 2 .  ivabradine (CORLANOR) 5 MG TABS tablet, Take 1 tablet (5 mg total) by mouth 2 (two) times daily with a meal., Disp: 30 tablet, Rfl: 3 .  levETIRAcetam (KEPPRA) 500 MG tablet, TAKE 1 TABLET BY MOUTH 2 TIMES DAILY, Disp: 180 tablet, Rfl: 3 .  sacubitril-valsartan (ENTRESTO) 24-26 MG, Take 1 tablet by mouth 2 (two) times daily., Disp: 60 tablet, Rfl: 6 Allergies  Allergen Reactions  . Spironolactone Other (See Comments)    Gynecomastia      Social History   Socioeconomic History  . Marital status: Divorced    Spouse name: Not on file  . Number of children: Not on file  . Years of education: Not on file  . Highest education level: Not on file  Occupational History  . Not on file  Tobacco Use  . Smoking status: Never Smoker  . Smokeless tobacco: Never Used  Vaping Use  . Vaping Use: Never used  Substance and Sexual Activity  . Alcohol use: Yes    Alcohol/week: 14.0 standard drinks    Types: 14 Cans of beer per week    Comment: patient endorses at least 1-2 beers daily (04/2020)  . Drug use: No  . Sexual  activity: Not Currently  Other Topics Concern  . Not on file  Social History Narrative   Lives at home with his brother who is crippled. He takes care of his brother.    Right handed    Social Determinants of Health   Financial Resource Strain: High Risk  . Difficulty of Paying Living Expenses: Hard  Food Insecurity: No Food Insecurity  . Worried About Programme researcher, broadcasting/film/video in the Last Year: Never true  . Ran Out of Food in the Last Year: Never true  Transportation Needs: No Transportation Needs  . Lack of Transportation (Medical): No  . Lack of Transportation (Non-Medical): No  Physical  Activity: Not on file  Stress: Not on file  Social Connections: Not on file  Intimate Partner Violence: Not on file    Physical Exam Vitals reviewed.  Constitutional:      Appearance: Normal appearance. He is normal weight.  HENT:     Head: Normocephalic.     Nose: Nose normal.     Mouth/Throat:     Mouth: Mucous membranes are moist.     Pharynx: Oropharynx is clear.  Eyes:     Pupils: Pupils are equal, round, and reactive to light.  Cardiovascular:     Rate and Rhythm: Normal rate and regular rhythm.     Pulses: Normal pulses.     Heart sounds: Normal heart sounds.  Pulmonary:     Effort: Pulmonary effort is normal.     Breath sounds: Normal breath sounds.  Abdominal:     General: Abdomen is flat.     Palpations: Abdomen is soft.  Musculoskeletal:        General: Normal range of motion.     Cervical back: Normal range of motion.     Right lower leg: No edema.     Left lower leg: No edema.  Skin:    General: Skin is warm and dry.     Capillary Refill: Capillary refill takes less than 2 seconds.  Neurological:     General: No focal deficit present.     Mental Status: He is alert. Mental status is at baseline.  Psychiatric:        Mood and Affect: Mood normal.        Behavior: Behavior normal.    Arrived for home visit for [name] who was alert and oriented reporting to be feeling good with no complaints. Conner reports he has been compliant with his medications and has had no chest pain, shortness of breath or dizziness. Lincon was assessed and vitals were obtained. I reviewed medications. Two pill boxes filled accordingly. Box # 2 missing a few medications. Instructions left with Dameer who reports he can place them in the box with the written instructions given once he picks them up from [facility]. Aydian was given written instructions for pick up times for tomorrows appointment at [facility]. [name] agreed with plan and agreed to be ready. Ranger stated his housing situation is  still undetermined and he is continuing to stay at his current location at [address] until he is told to leave. Kassem was encouraged to seek out any options of housing that was stable for him and he agreed stating he was working on it. LCSW [name] has been talking with him about this as well. Aengus informed me he has a court date for May 10th at 0930. We will assist with transportation closer to the date. Home visit complete. I will see Mckade  in two weeks.   Refills -Zetia -Carvedilol -[name] -Inspra -Corlanor      Future Appointments  Date Time Provider Department Center  04/06/2021  2:30 PM Kirtland Bouchard, PA-C [facility] None  04/27/2021 10:00 AM [name], Bevelyn Buckles, MD [facility] None  06/22/2021  7:25 AM [facility] DEVICE REMOTES [facility]  09/21/2021  7:25 AM [facility] DEVICE REMOTES [facility]  12/21/2021  7:25 AM [facility] DEVICE REMOTES [facility]     ACTION: Home visit completed Next visit planned for 2 weeks

## 2021-04-06 ENCOUNTER — Ambulatory Visit (INDEPENDENT_AMBULATORY_CARE_PROVIDER_SITE_OTHER): Payer: Medicaid Other | Admitting: Physician Assistant

## 2021-04-06 ENCOUNTER — Ambulatory Visit (INDEPENDENT_AMBULATORY_CARE_PROVIDER_SITE_OTHER): Payer: Medicaid Other

## 2021-04-06 ENCOUNTER — Encounter: Payer: Self-pay | Admitting: Physician Assistant

## 2021-04-06 ENCOUNTER — Other Ambulatory Visit: Payer: Self-pay

## 2021-04-06 DIAGNOSIS — S52602A Unspecified fracture of lower end of left ulna, initial encounter for closed fracture: Secondary | ICD-10-CM

## 2021-04-06 DIAGNOSIS — S52602D Unspecified fracture of lower end of left ulna, subsequent encounter for closed fracture with routine healing: Secondary | ICD-10-CM

## 2021-04-06 DIAGNOSIS — S52601A Unspecified fracture of lower end of right ulna, initial encounter for closed fracture: Secondary | ICD-10-CM

## 2021-04-06 NOTE — Progress Notes (Signed)
Office Visit Note   Patient: [name]           Date of Birth: 12/30/1960           MRN: [id number] Visit Date: 04/06/2021              Requested by: Claiborne Rigg, NP [address] [place],  [place] [address] PCP: Claiborne Rigg, NP   Assessment & Plan: Visit Diagnoses:  1. Closed fracture of distal end of right ulna, unspecified fracture morphology, initial encounter     Plan: We will continue the removable Velcro splint.  He will continue work on gentle range of motion hand forearm and elbow.  We will see him back in 1 month for hopefully final x-rays of the left forearm.  Questions were encouraged and answered.  Still had recent pain staples in his head from the ER these were removed today the area over the frontal scalp area showed no signs of infection the lacerations were completely healed.   Follow-Up Instructions: Return in about 4 weeks (around 05/04/2021) for Radiographs.   Orders:  Orders Placed This Encounter  Procedures  . XR Forearm Left   No orders of the defined types were placed in this encounter.     Procedures: No procedures performed   Clinical Data: No additional findings.   Subjective: Chief Complaint  Patient presents with  . Left Arm - Fracture, Follow-up    HPI [name] returns today 2 months status post left forearm distal ulnar shaft fracture.  He has been wearing a removable Velcro splint.  He is not taking anything for pain.  Still having some discomfort. Review of Systems See HPI  Objective: Vital Signs: There were no vitals taken for this visit.  Physical Exam Pulmonary:     Effort: Pulmonary effort is normal.  Neurological:     Mental Status: He is alert and oriented to person, place, and time.  Psychiatric:        Mood and Affect: Mood normal.     Ortho Exam Left forearm full extension full supination pronation has tenderness over the distal ulnar shaft.    Specialty Comments:  No specialty  comments available.  Imaging: XR Forearm Left  Result Date: 04/06/2021 Left forearm 2 views: Position alignment remains acceptable.  There is good further consolidation of the ulnar shaft fracture site.  Pressure however is still not completely healed.  No other fractures identified.    PMFS History: Patient Active Problem List   Diagnosis Date Noted  . Closed fracture of right upper limb 01/27/2021  . Pneumonia due to COVID-19 virus 08/14/2020  . Hyponatremia 08/14/2020  . Elevated AST (SGOT) 08/14/2020  . Prolonged QT interval 08/14/2020  . Cardiac arrest, cause unspecified (HCC) 07/06/2020  . Left arm cellulitis 05/19/2020  . Essential hypertension 05/18/2020  . Mixed hyperlipidemia 05/18/2020  . Alcohol abuse 05/18/2020  . Cellulitis of left upper extremity 05/18/2020  . Lactic acidosis 05/18/2020  . 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 ICD (implantable cardioverter-defibrillator) in place 08/29/2019  . Chronic systolic heart failure (HCC) 08/25/2019  . 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 Coronary artery disease involving native coronary artery of native heart without angina pectoris 12/07/2017   Past Medical History:  Diagnosis Date  . AICD (automatic cardioverter/defibrillator) present   . Alcohol abuse 05/18/2020  . Arthritis    "hands; maybe my toes" (12/20/2017)  .  CAD (coronary artery disease)   . Cardiac arrest (HCC) 11/29/2017   hx VF arrest/notes 12/20/2017  . Chronic systolic heart failure (HCC) 08/25/2019  . Coronary artery disease   . Coronary artery disease involving native coronary artery of native heart without angina pectoris 12/07/2017  . Essential hypertension 05/18/2020  . GERD (gastroesophageal reflux disease)   . H/O ETOH abuse    /notes 12/20/2017  . High cholesterol   . Hypertension   . Ischemic cardiomyopathy    a. 08/2019 s/p BSX D232 single lead AICD.  Marland Kitchen Mixed  hyperlipidemia 05/18/2020  . Seizure (HCC) 10/21/2019  . Seizures (HCC) ~ 2016; ~ 2017   "I've had a couple; I was at work" (12/20/2017)  . 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 Syncope and collapse    "last few days" (12/20/2017)    Family History  Problem Relation Age of Onset  . Heart attack Other   . Diabetes Neg Hx     Past Surgical History:  Procedure Laterality Date  . CARDIAC CATHETERIZATION    . CORONARY ARTERY BYPASS GRAFT N/A 12/07/2017   Procedure: CORONARY ARTERY BYPASS GRAFTING (CABG) times four using left internal mammary artery and right saphenous vein using endoscope for harvest.;  Surgeon: [name], MD;  Location: [facility] OR;  Service: Open Heart Surgery;  Laterality: N/A;  . I & D EXTREMITY Left 05/20/2020   Procedure: IRRIGATION AND DEBRIDEMENT OF WRIST;  Surgeon: Ernest Mallick, MD;  Location: [facility] OR;  Service: Orthopedics;  Laterality: Left;  . IABP INSERTION N/A 11/29/2017   Procedure: IABP Insertion;  Surgeon: [name], MD;  Location: [facility] INVASIVE CV LAB;  Service: Cardiovascular;  Laterality: N/A;  . IABP INSERTION N/A 12/03/2017   Procedure: IABP INSERTION;  Surgeon: [name], MD;  Location: [facility] INVASIVE CV LAB;  Service: Cardiovascular;  Laterality: N/A;  . ICD IMPLANT N/A 08/29/2019   Procedure: ICD IMPLANT;  Surgeon: Marinus Maw, MD;  Location: [facility] INVASIVE CV LAB;  Service: Cardiovascular;  Laterality: N/A;  . LEFT HEART CATH AND CORONARY ANGIOGRAPHY N/A 11/29/2017   Procedure: LEFT HEART CATH AND CORONARY ANGIOGRAPHY;  Surgeon: [name], MD;  Location: [facility] INVASIVE CV LAB;  Service: Cardiovascular;  Laterality: N/A;  . RIGHT HEART CATH N/A 11/29/2017   Procedure: RIGHT HEART CATH;  Surgeon: [name], MD;  Location: [facility] INVASIVE CV LAB;  Service: Cardiovascular;  Laterality: N/A;  . TEE WITHOUT CARDIOVERSION N/A 12/07/2017    Procedure: TRANSESOPHAGEAL ECHOCARDIOGRAM (TEE);  Surgeon: Donata Clay, Theron Arista, MD;  Location: [facility] OR;  Service: Open Heart Surgery;  Laterality: N/A;  . TONSILLECTOMY AND ADENOIDECTOMY  ~ 1972   Social History   Occupational History  . Not on file  Tobacco Use  . Smoking status: Never Smoker  . Smokeless tobacco: Never Used  Vaping Use  . Vaping Use: Never used  Substance and Sexual Activity  . Alcohol use: Yes    Alcohol/week: 14.0 standard drinks    Types: 14 Cans of beer per week    Comment: patient endorses at least 1-2 beers daily (04/2020)  . Drug use: No  . Sexual activity: Not Currently

## 2021-04-12 ENCOUNTER — Telehealth: Payer: Self-pay

## 2021-04-12 NOTE — Telephone Encounter (Signed)
Can we fix the Dx code on 03/03/21 note please

## 2021-04-21 ENCOUNTER — Telehealth (HOSPITAL_COMMUNITY): Payer: Self-pay | Admitting: Licensed Clinical Social Worker

## 2021-04-21 NOTE — Telephone Encounter (Signed)
CSW called pt to check in re housing as their was a court date Tuesday regarding their eviction.  Court ruled pt had to move out by tomorrow.  Pt is working on moving out and plans on going to motel with his girlfriend.  CSW encouraged pt to keep me and paramedic updated on his plans so we could continue to help him.  Will continue to follow and assist as needed  Burna Sis, LCSW Clinical Social Worker [facility] Desk#: [phone] Cell#: [phone]

## 2021-04-26 ENCOUNTER — Telehealth (HOSPITAL_COMMUNITY): Payer: Self-pay | Admitting: Licensed Clinical Social Worker

## 2021-04-26 NOTE — Progress Notes (Signed)
ADVANCED HF CLINIC NOTE  PCP: Bertram Denver, NP HF Cardiologist: Dr Gala Romney   Reason for Visit: F/u for chronic systolic heart failure   HPI: [name] is a 60 y.o. male with h/o ETOH abuse, VF Arrest 2018, CAD s/p CABG 12/07/17, and ICM with EF 25-30%.   Admitted 12/18 with VF arrest while chopping wood. Received CPR in the field. R/LHC with severe 3vD as below and cardiogenic shock requiring IABP and milrinone. Required milrinone with continued shock. Pt improved and IABP removed 12/10, but pt had recurrent VF arrest so IABP replaced. Stabilized and underwent CABG 12/07/17. Tolerated gradual wean of meds and extubation with no further events.  Echo 04/2019  LVEF at 25%.    9/20 implantation of a [employer] single chamber ICD.   He had routine clinic f/u on 10/23/19 and while in exam room had a witnessed seizure  with loss of pulse. CPR started with quick ROSC. He did not require shock. Taken to the ED and had another seizure. Neurology consulted. Started on Keppra. HS Trop 10>12.  ICD interrogated. No arrhythmias noted. Admitted to [facility] on 12/09/19 with recurrent seizures in setting of noncompliance with his Keppra. .   Admitted 8/21 with COVID PNA.   Today he returns for HF follow up. Says he feels pretty good. Larey Seat and broke his left arm a few weeks ago. Cast off now. Says he has been having tingling in his face and left arm. No visual changes or other neuro symptoms  Has paramedicine following every 2 weeks. Says he is taking all meds. Is still drinking several drinks per day. Seems to be intoxicated this am. No CP, edema, orthopnea or PND.    ROS: All systems negative except as listed in HPI, PMH and Problem List.  SH:  Social History   Socioeconomic History  . Marital status: Divorced    Spouse name: Not on file  . Number of children: Not on file  . Years of education: Not on file  . Highest education level: Not on file  Occupational  History  . Not on file  Tobacco Use  . Smoking status: Never Smoker  . Smokeless tobacco: Never Used  Vaping Use  . Vaping Use: Never used  Substance and Sexual Activity  . Alcohol use: Yes    Alcohol/week: 14.0 standard drinks    Types: 14 Cans of beer per week    Comment: patient endorses at least 1-2 beers daily (04/2020)  . Drug use: No  . Sexual activity: Not Currently  Other Topics Concern  . Not on file  Social History Narrative   Lives at home with his brother who is crippled. He takes care of his brother.    Right handed    Social Determinants of Health   Financial Resource Strain: High Risk  . Difficulty of Paying Living Expenses: Hard  Food Insecurity: No Food Insecurity  . Worried About Programme researcher, broadcasting/film/video in the Last Year: Never true  . Ran Out of Food in the Last Year: Never true  Transportation Needs: No Transportation Needs  . Lack of Transportation (Medical): No  . Lack of Transportation (Non-Medical): No  Physical Activity: Not on file  Stress: Not on file  Social Connections: Not on file  Intimate Partner Violence: Not on file    FH:  Family History  Problem Relation Age of Onset  . Heart attack Other   . Diabetes Neg Hx  Past Medical History:  Diagnosis Date  . AICD (automatic cardioverter/defibrillator) present   . Alcohol abuse 05/18/2020  . Arthritis    "hands; maybe my toes" (12/20/2017)  . CAD (coronary artery disease)   . Cardiac arrest (HCC) 11/29/2017   hx VF arrest/notes 12/20/2017  . Chronic systolic heart failure (HCC) 08/25/2019  . Coronary artery disease   . Coronary artery disease involving native coronary artery of native heart without angina pectoris 12/07/2017  . Essential hypertension 05/18/2020  . GERD (gastroesophageal reflux disease)   . H/O ETOH abuse    /notes 12/20/2017  . High cholesterol   . Hypertension   . Ischemic cardiomyopathy    a. 08/2019 s/p BSX D232 single lead AICD.  Marland Kitchen Mixed hyperlipidemia 05/18/2020   . Seizure (HCC) 10/21/2019  . Seizures (HCC) ~ 2016; ~ 2017   "I've had a couple; I was at work" (12/20/2017)  . 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 Syncope and collapse    "last few days" (12/20/2017)    Current Outpatient Medications  Medication Sig Dispense Refill  . albuterol (VENTOLIN HFA) 108 (90 Base) MCG/ACT inhaler Inhale 2 puffs into the lungs every 6 (six) hours as needed for wheezing or shortness of breath. (Patient not taking: No sig reported) 6.7 g 0  . atorvastatin (LIPITOR) 80 MG tablet Take 1 tablet (80 mg total) by mouth daily at 6 PM. 90 tablet 3  . carvedilol (COREG) 3.125 MG tablet Take 1 tablet (3.125 mg total) by mouth 2 (two) times daily with a meal. 60 tablet 4  . cetirizine (ZYRTEC) 10 MG tablet Take 1 tablet (10 mg total) by mouth daily. (Patient not taking: No sig reported) 30 tablet 11  . eplerenone (INSPRA) 25 MG tablet Take 1 tablet (25 mg total) by mouth daily. 30 tablet 3  . ezetimibe (ZETIA) 10 MG tablet Take 1 tablet (10 mg total) by mouth daily. 30 tablet 6  . ivabradine (CORLANOR) 5 MG TABS tablet Take 1 tablet (5 mg total) by mouth 2 (two) times daily with a meal. 30 tablet 3  . levETIRAcetam (KEPPRA) 500 MG tablet TAKE 1 TABLET BY MOUTH 2 TIMES DAILY 180 tablet 3  . sacubitril-valsartan (ENTRESTO) 24-26 MG Take 1 tablet by mouth 2 (two) times daily. 60 tablet 6   No current facility-administered medications for this encounter.    Vitals:   04/27/21 0923  BP: 118/70  Pulse: 87  SpO2: 94%  Weight: 66.9 kg (147 lb 6.4 oz)   Wt Readings from Last 3 Encounters:  04/27/21 66.9 kg (147 lb 6.4 oz)  04/05/21 66.2 kg (146 lb)  03/22/21 66.2 kg (146 lb)   PHYSICAL EXAM: General:  Sitting on exam table  No resp difficulty. Possibly intoxicated HEENT: normal Neck: supple. no JVD. Carotids 2+ bilat; no bruits. No lymphadenopathy or thryomegaly appreciated. Cor: PMI  nondisplaced. Regular rate & rhythm. No rubs, gallops or murmurs. Lungs: clear Abdomen: soft, nontender, nondistended. No hepatosplenomegaly. No bruits or masses. Good bowel sounds. Extremities: no cyanosis, clubbing, rash, edema Neuro: alert & orientedx3, cranial nerves grossly intact. moves all 4 extremities w/o difficulty. Affect pleasant.   ASSESSMENT & PLAN:   1. Chronic systolic CHF with ICM - Echo 11/30/17 25-30% RV normal.   - Echo 03/2018: EF 40-45%  RV normal.  - ECHO 5/20 EF ~ 25%. Has ICD.  - ECHO 12/2019 EF 30-35% RV normal - NYHA II,  - Continue PRN  lasix 20 mg.  - Continue eplerenone 25 mg daily.  - Increase carvedilol to 6.25 bid  - Continue Entresto 24-26 mg twice a day. He did not tolerate 49-51 mg dose due to hypotension.  - Continue Corlanor 5 mg bid. HR 64 today. - Not a candidate for advanced therapies due to ongoing alcohol abuse. - Labs today  2. CAD s/p CABG 12/07/2017 - Stable . No s/s angina  - Continue ASA, ? blocker, atorvastatin + zetia.   3. H/o VF arrest in setting of STEMI - Has ICD, followed by EP - Device interrogation in clinic. HL score 3.Fluid ok. Activity level 5.4 hr/day No VT   4. Seizures: - Denies any recent recurrence.  - Continue Keppra 500 mg bid.  - Followed by Neurology.    5. ETOH abuse  - Still drinking beer on daily basis  - Again discussed need for alcohol cessation. He has no plans to quit.  - Check ETOH level today    6. H/O Noncompliance/ Poor Health Literacy  - Followed by HF Paramedicine.  Appreciate their assistance.  - Discussed importance of strict compliance w/ meds.  7. Facial tinging  - likely benign - check carotid u/s for completeness   Arvilla Meres  MD 10:58 PM

## 2021-04-26 NOTE — Telephone Encounter (Signed)
Pt set up with [facility] Medicaid transport to clinic appt tomorrow  Ride pick up 8:45-9:15am Ride confirmation #: [phone] Pick up from appt at 11am  Pt aware of above details- will continue to follow and assist as needed  Burna Sis, LCSW Clinical Social Worker [facility] Desk#: [phone] Cell#: [phone]

## 2021-04-27 ENCOUNTER — Ambulatory Visit (HOSPITAL_COMMUNITY)
Admission: RE | Admit: 2021-04-27 | Discharge: 2021-04-27 | Disposition: A | Discharge status: Home / Self Care | Payer: Medicaid Other | Source: Ambulatory Visit | Attending: Internal Medicine | Admitting: Internal Medicine

## 2021-04-27 ENCOUNTER — Encounter (HOSPITAL_COMMUNITY): Payer: Self-pay | Admitting: Internal Medicine

## 2021-04-27 ENCOUNTER — Ambulatory Visit (INDEPENDENT_AMBULATORY_CARE_PROVIDER_SITE_OTHER): Payer: Medicaid Other

## 2021-04-27 ENCOUNTER — Other Ambulatory Visit: Payer: Self-pay

## 2021-04-27 ENCOUNTER — Other Ambulatory Visit (HOSPITAL_COMMUNITY): Payer: Self-pay

## 2021-04-27 ENCOUNTER — Other Ambulatory Visit (HOSPITAL_COMMUNITY): Payer: Self-pay | Admitting: Internal Medicine

## 2021-04-27 VITALS — BP 118/70 | HR 87 | Wt 147.4 lb

## 2021-04-27 DIAGNOSIS — Z951 Presence of aortocoronary bypass graft: Secondary | ICD-10-CM | POA: Insufficient documentation

## 2021-04-27 DIAGNOSIS — F1010 Alcohol abuse, uncomplicated: Secondary | ICD-10-CM | POA: Diagnosis not present

## 2021-04-27 DIAGNOSIS — U071 COVID-19: Secondary | ICD-10-CM | POA: Diagnosis not present

## 2021-04-27 DIAGNOSIS — R202 Paresthesia of skin: Secondary | ICD-10-CM

## 2021-04-27 DIAGNOSIS — J1282 Pneumonia due to coronavirus disease 2019: Secondary | ICD-10-CM | POA: Insufficient documentation

## 2021-04-27 DIAGNOSIS — Z9114 Patient's other noncompliance with medication regimen: Secondary | ICD-10-CM | POA: Diagnosis not present

## 2021-04-27 DIAGNOSIS — R569 Unspecified convulsions: Secondary | ICD-10-CM | POA: Diagnosis not present

## 2021-04-27 DIAGNOSIS — Z79899 Other long term (current) drug therapy: Secondary | ICD-10-CM | POA: Diagnosis not present

## 2021-04-27 DIAGNOSIS — Z8249 Family history of ischemic heart disease and other diseases of the circulatory system: Secondary | ICD-10-CM | POA: Diagnosis not present

## 2021-04-27 DIAGNOSIS — E782 Mixed hyperlipidemia: Secondary | ICD-10-CM

## 2021-04-27 DIAGNOSIS — I2510 Atherosclerotic heart disease of native coronary artery without angina pectoris: Secondary | ICD-10-CM

## 2021-04-27 DIAGNOSIS — Z9119 Patient's noncompliance with other medical treatment and regimen: Secondary | ICD-10-CM | POA: Diagnosis not present

## 2021-04-27 DIAGNOSIS — Z8674 Personal history of sudden cardiac arrest: Secondary | ICD-10-CM | POA: Diagnosis not present

## 2021-04-27 DIAGNOSIS — I5022 Chronic systolic (congestive) heart failure: Secondary | ICD-10-CM

## 2021-04-27 DIAGNOSIS — I959 Hypotension, unspecified: Secondary | ICD-10-CM | POA: Insufficient documentation

## 2021-04-27 DIAGNOSIS — I6529 Occlusion and stenosis of unspecified carotid artery: Secondary | ICD-10-CM

## 2021-04-27 HISTORY — DX: Heart failure, unspecified: I50.9

## 2021-04-27 LAB — COMPREHENSIVE METABOLIC PANEL
ALT: 23 U/L (ref 0–44)
AST: 37 U/L (ref 15–41)
Albumin: 3.6 g/dL (ref 3.5–5.0)
Alkaline Phosphatase: 86 U/L (ref 38–126)
Anion gap: 11 (ref 5–15)
BUN: 8 mg/dL (ref 6–20)
CO2: 22 mmol/L (ref 22–32)
Calcium: 8.5 mg/dL — ABNORMAL LOW (ref 8.9–10.3)
Chloride: 108 mmol/L (ref 98–111)
Creatinine, Ser: 0.86 mg/dL (ref 0.61–1.24)
GFR, Estimated: >60 mL/min (ref >60)
Glucose, Bld: 88 mg/dL (ref 70–99)
Potassium: 3.8 mmol/L (ref 3.5–5.1)
Sodium: 141 mmol/L (ref 135–145)
Total Bilirubin: 0.7 mg/dL (ref 0.3–1.2)
Total Protein: 6.5 g/dL (ref 6.5–8.1)

## 2021-04-27 LAB — LIPID PANEL
Cholesterol: 149 mg/dL (ref 0–200)
HDL: 52 mg/dL (ref >40)
LDL Cholesterol: 63 mg/dL (ref 0–99)
Total CHOL/HDL Ratio: 2.9 RATIO
Triglycerides: 169 mg/dL — ABNORMAL HIGH (ref <150)
VLDL: 34 mg/dL (ref 0–40)

## 2021-04-27 LAB — MAGNESIUM: Magnesium: 2.3 mg/dL (ref 1.7–2.4)

## 2021-04-27 LAB — BRAIN NATRIURETIC PEPTIDE: B Natriuretic Peptide: 148.1 pg/mL — ABNORMAL HIGH (ref 0.0–100.0)

## 2021-04-27 LAB — ETHANOL: Alcohol, Ethyl (B): 145 mg/dL — ABNORMAL HIGH (ref <10)

## 2021-04-27 MED ORDER — CARVEDILOL 6.25 MG PO TABS: 6.2500 mg | ORAL_TABLET | Freq: Two times a day (BID) | ORAL | 3 refills | Status: DC

## 2021-04-27 NOTE — Addendum Note (Signed)
Encounter addended by: Chinita Pester, CMA on: 04/27/2021 9:51 AM  Actions taken: Visit diagnoses modified, Order list changed, Diagnosis association updated, Clinical Note Signed, Charge Capture section accepted

## 2021-04-27 NOTE — Patient Instructions (Signed)
Labs done today. We will contact you only if your labs are abnormal.  INCREASE Carvedilol (Coreg) to 6.25mg  (1 tablet) by mouth 2 times daily.  No other medication changes were made. Please continue all current medications as prescribed.  Your physician has requested that you have a carotid duplex. This test is an ultrasound of the carotid arteries in your neck. It looks at blood flow through these arteries that supply the brain with blood. Allow one hour for this exam. There are no restrictions or special instructions. This has to be approved through your insurance prior to scheduling, once approved we will contact you to schedule an appointment.  Your physician recommends that you schedule a follow-up appointment in: 6 months with Dr. Gala Romney. Please contact our office in October to schedule a November appointment.  If you have any questions or concerns before your next appointment please send [place] a message through [place] or call our office at [phone].    TO LEAVE A MESSAGE FOR THE NURSE SELECT OPTION 2, PLEASE LEAVE A MESSAGE INCLUDING: . YOUR NAME . DATE OF BIRTH . CALL BACK NUMBER . REASON FOR CALL**this is important as we prioritize the call backs  YOU WILL RECEIVE A CALL BACK THE SAME DAY AS LONG AS YOU CALL BEFORE 4:00 PM   Do the following things EVERYDAY: 1) Weigh yourself in the morning before breakfast. Write it down and keep it in a log. 2) Take your medicines as prescribed 3) Eat low salt foods--Limit salt (sodium) to 2000 mg per day.  4) Stay as active as you can everyday 5) Limit all fluids for the day to less than 2 liters   At the [facility], you and your health needs are our priority. As part of our continuing mission to provide you with exceptional heart care, we have created designated Provider Care Teams. These Care Teams include your primary Cardiologist (physician) and Advanced Practice Providers (APPs- Physician Assistants and Nurse  Practitioners) who all work together to provide you with the care you need, when you need it.   You may see any of the following providers on your designated Care Team at your next follow up: Marland Kitchen Dr Arvilla Meres . Dr Marca Ancona . Tonye Becket, NP . Robbie Lis, PA . Karle Plumber, PharmD   Please be sure to bring in all your medications bottles to every appointment.

## 2021-04-27 NOTE — Progress Notes (Signed)
Paramedicine Encounter    Patient ID: [name], male    DOB: [birth year], 60 y.o.   MRN: [id number]  Met with [name] in the clinic today where he was seen by [name].   [name] increased Carvedilol to 6.14m BID.   I reviewed note and medications. I filled pill box accordingly.   Pill box missing Zetia and Inspra [place] [facility].   I will call in remaining refills and see [place] one week.   Refills: Keppra Corlanor Inspra Zetia    WCoyreports he has no plan in place for housing at this time. I will continue to assist as I can with the help of LCSW Jenna Uris.    Future Appointments  Date Time Provider DVinita 05/05/2021  9:00 AM [facility] NL VASC 3 [facility]  06/22/2021  7:25 AM [facility] DEVICE REMOTES [facility]  09/21/2021  7:25 AM [facility] DEVICE REMOTES [facility]  12/21/2021  7:25 AM [facility] DEVICE REMOTES [facility]     ACTION: Next visit planned for one week

## 2021-04-28 LAB — CUP PACEART REMOTE DEVICE CHECK
Battery Remaining Longevity: 162 mo
Battery Remaining Percentage: 100 %
Brady Statistic RV Percent Paced: 0 %
Date Time Interrogation Session: 20220504091900
HighPow Impedance: 60 Ohm
Implantable Lead Implant Date: 20200904
Implantable Lead Location: 753860
Implantable Lead Model: 293
Implantable Lead Serial Number: 443717
Implantable Pulse Generator Implant Date: 20200904
Lead Channel Impedance Value: 393 Ohm
Lead Channel Setting Pacing Amplitude: 2.5 V
Lead Channel Setting Pacing Pulse Width: 0.4 ms
Lead Channel Setting Sensing Sensitivity: 0.5 mV
Pulse Gen Serial Number: 271403

## 2021-04-28 NOTE — Telephone Encounter (Signed)
Entered in error

## 2021-05-02 ENCOUNTER — Telehealth (HOSPITAL_COMMUNITY): Payer: Self-pay

## 2021-05-02 NOTE — Telephone Encounter (Signed)
Left message for [name] to return my call to schedule our weekly visit for tomorrow. I will continue to reach out.

## 2021-05-05 ENCOUNTER — Ambulatory Visit (HOSPITAL_COMMUNITY)
Admission: RE | Admit: 2021-05-05 | Payer: Medicaid Other | Source: Ambulatory Visit | Attending: Internal Medicine | Admitting: Internal Medicine

## 2021-05-05 ENCOUNTER — Encounter (HOSPITAL_COMMUNITY): Payer: Medicaid Other

## 2021-05-05 ENCOUNTER — Telehealth (HOSPITAL_COMMUNITY): Payer: Self-pay | Admitting: Licensed Clinical Social Worker

## 2021-05-05 NOTE — Telephone Encounter (Signed)
CSW received call from pt stating that he missed an appt this morning.  CSW called [facility] office and rescheduled Carotid ultrasound for Wednesday at 11am  Will check with pt early next week to see where he will be at as he has to leave his current living situation on Tuesday.  Will continue to follow and assist as needed  Burna Sis, LCSW Clinical Social Worker [facility] Desk#: [phone] Cell#: [phone]

## 2021-05-09 ENCOUNTER — Telehealth (HOSPITAL_COMMUNITY): Payer: Self-pay

## 2021-05-09 NOTE — Telephone Encounter (Signed)
Attempted to return [facility] call from earlier today with no success. I will attempt to call and have home visit tomorrow.

## 2021-05-10 ENCOUNTER — Other Ambulatory Visit (HOSPITAL_COMMUNITY): Payer: Self-pay

## 2021-05-10 ENCOUNTER — Telehealth (HOSPITAL_COMMUNITY): Payer: Self-pay | Admitting: Licensed Clinical Social Worker

## 2021-05-10 NOTE — Telephone Encounter (Signed)
Pt officially having to move out of his home today- currently without a clear plan where to go.  Pt is aware of local shelter option but not interested in shelter placement- is working on renting a room in a house with some people that he knows but unable to confirm that plan or address at this time.  Has an appt with [facility] tomorrow for carotid ultrasound- Paramedic established with pt to [facility] [place].  CSW arranged transport- anticipate pick up between 9:45am-10:15am- pt expressed understanding and will plan to attend.  Pt also reports he has no money on his food stamp card- usually this is re-filled on the 15th of each month.  CSW assisted in calling [facility] and was informed pt did not receive his benefit because he did not complete the re certification process.  CSW asked if we could complete over the phone- sent to pt case worker, Angelica ext [phone], to discuss but unable to reach- left VM requesting return call.  Will continue to follow and assist as needed  Burna Sis, LCSW Clinical Social Worker [facility] Desk#: [phone] Cell#: [phone]
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
## 2021-05-11 ENCOUNTER — Ambulatory Visit (HOSPITAL_COMMUNITY)
Admission: RE | Admit: 2021-05-11 | Discharge: 2021-05-11 | Disposition: A | Discharge status: Home / Self Care | Payer: Medicaid Other | Source: Ambulatory Visit | Attending: Cardiology | Admitting: Cardiology

## 2021-05-11 ENCOUNTER — Other Ambulatory Visit: Payer: Self-pay

## 2021-05-11 DIAGNOSIS — I6529 Occlusion and stenosis of unspecified carotid artery: Secondary | ICD-10-CM

## 2021-05-11 DIAGNOSIS — R202 Paresthesia of skin: Secondary | ICD-10-CM

## 2021-05-13 ENCOUNTER — Telehealth (HOSPITAL_COMMUNITY): Payer: Self-pay | Admitting: Licensed Clinical Social Worker

## 2021-05-13 NOTE — Telephone Encounter (Signed)
CSW attempted to call [employer] to check with food stamp re-certification- left VM requesting return call back  Will continue to follow and assist as needed  Burna Sis, LCSW Clinical Social Worker [facility] Desk#: [phone] Cell#: [phone]

## 2021-05-16 ENCOUNTER — Telehealth (HOSPITAL_COMMUNITY): Payer: Self-pay | Admitting: Licensed Clinical Social Worker

## 2021-05-16 NOTE — Telephone Encounter (Signed)
CSW attempted to call [facility] again to inquire about pt food stamp recert- left message for pt's food stamp caseworker's supervisor as I have not heard back from case worker after multiple attempts- left VM requesting return call  Will continue to follow and assist as needed  Burna Sis, LCSW Clinical Social Worker [facility] Desk#: [phone] Cell#: [phone]

## 2021-05-17 ENCOUNTER — Other Ambulatory Visit (HOSPITAL_COMMUNITY): Payer: Self-pay

## 2021-05-17 ENCOUNTER — Telehealth (HOSPITAL_COMMUNITY): Payer: Self-pay | Admitting: Licensed Clinical Social Worker

## 2021-05-17 NOTE — Progress Notes (Signed)
Paramedicine Encounter    Patient ID: [name], male    DOB: 13-Feb-1961, 60 y.o.   MRN: [id number]  Arrived to see Endrit today in [place] [facility]. He reports he is living day to day not knowing where his next meal will come from or where he will sleep but reports he is figuring it out. He has been communicating with [place] our LCSW about same.   Heraclio reports having a productive cough, headache, runny nose and sore body for over a week now. Dallis has not been tested for C-19.   Carlen has been without his medications for over 5 days due to him losing them during being evicted. [facility] and [facility]s for $21.00. Medications will be ready for pick up at 5:00 today, Mavrik will pick up same with provided cash from me. Kalel was grateful and understood to use the cash for medication pick up only.   Karandeep agreed to home visit in one week to meet in [name] at a mutual location.   NO vitals today due to being out in the rain.    ACTION: Home visit completed

## 2021-05-17 NOTE — Progress Notes (Signed)
Remote ICD transmission.   

## 2021-05-17 NOTE — Telephone Encounter (Signed)
Pt called CSW to check in regarding food stamps- CSW has left multiple messages with food stamp case worker but has been unable to touch base regarding pt re-certified- left another VM this morning.  Pt has been living off of food donations from a church that we provided him- CSW provided him with food pantry information for [name]- he is familiar with this location and will go if need be.  Pt reports that he has been staying outside with his girlfriend in a tent but they have now broken up.  He is unsure where he is going to go and is worried about what he is going to do with his dog.  Pt was not agreeable to shelter before but is willing to consider it now.  CSW called [facility]- they have availability but need to complete phone intake with pt [name] [phone])- pt will try to call a few friends first and if he is unable to stay with them he is agreeable to calling shelter about staying there.  CSW will continue to follow and assist as needed  Burna Sis, LCSW Clinical Social Worker [facility] Desk#: [phone] Cell#: [phone]

## 2021-05-17 NOTE — Telephone Encounter (Signed)
Pt called to complete intake with [facility] but was told only availability was a top bunk which he would not be eligible for due to past history of seizures.  Pt reports he is still working on possibly staying with friends but if not plans to stay in his tent.  CSW will continue to work with pt and try to help find housing- will also assist with medication copays as pt cant get his medications out of the house he had to leave and won't get his check until next week.  Burna Sis, LCSW Clinical Social Worker [facility] Desk#: [phone] Cell#: [phone]

## 2021-05-20 ENCOUNTER — Telehealth (HOSPITAL_COMMUNITY): Payer: Self-pay | Admitting: Licensed Clinical Social Worker

## 2021-05-20 NOTE — Telephone Encounter (Signed)
CSW assisted patient in filling out food stamp application online for recertification through [employer].  Was able to speak with a food stamp case worker at [facility] who states that pt benefits might be lowered as pt has no housing expenses at this time.  States that when pt housing situation changes we need to inform them ASAP so that they can update pt housing expenses and address which could adjust his benefits amount.  States best way is to email the case worker directly ([email or web address])  CSW will continue to follow and assist pt as needed  Burna Sis, LCSW Clinical Social Worker [facility] Desk#: [phone] Cell#: [phone]

## 2021-05-24 ENCOUNTER — Telehealth (HOSPITAL_COMMUNITY): Payer: Self-pay

## 2021-05-24 NOTE — Telephone Encounter (Signed)
Attempted to reach [facility] for scheduling a visit today, no answer on call and voicemail box is full. I will continue to attempt to call.

## 2021-05-30 ENCOUNTER — Telehealth (HOSPITAL_COMMUNITY): Payer: Self-pay

## 2021-05-30 NOTE — Telephone Encounter (Signed)
Attempted to call [name] to plan a meeting for visit tomorrow however his phone is cut off. I will drive around the area in which he frequents tomorrow to attempt to locate him.

## 2021-06-02 ENCOUNTER — Telehealth (HOSPITAL_COMMUNITY): Payer: Self-pay | Admitting: Licensed Clinical Social Worker

## 2021-06-02 NOTE — Telephone Encounter (Signed)
CSW attempted to call pt to check in- unable to reach or leave VM  Will continue to attempt contact  Burna Sis, LCSW Clinical Social Worker [facility] Desk#: [phone] Cell#: [phone]

## 2021-06-08 ENCOUNTER — Encounter (HOSPITAL_COMMUNITY): Payer: Self-pay | Admitting: Adult Health

## 2021-06-08 NOTE — Progress Notes (Signed)
Team Based Care Meeting  HF MD- NA  HF NP - Diamond Jentz NP-C   [facility] HF Paramedicine  [facility] Vicente Males   Medications concerns? Yes ongoing noncompliance. He does not use disability check to pay for meds.   Transportation issues ? Yes no drivers license   Education needs? Yes  SDOH - Yes homeless. Unable to be located.   Unable to locate now that he is homeless and has not phone. Will discharge for HF Paramedicine. Can re-enroll if returns to clinic.   Dore Oquin NP-C  4:35 PM

## 2021-06-13 ENCOUNTER — Encounter (HOSPITAL_COMMUNITY): Payer: Self-pay

## 2021-06-13 NOTE — Progress Notes (Signed)
Paramedicine Encounter    Patient ID: [name], male    DOB: 03/02/1961, 60 y.o.   MRN: [id number]   Discharge per [facility]. Patient is now homeless, and we are unable to reach him via home visit or telephone. We will re-evaluate paramedicine if he returns to clinic. Discharge complete.       ACTION: Home visit completed

## 2021-06-22 DIAGNOSIS — F29 Unspecified psychosis not due to a substance or known physiological condition: Secondary | ICD-10-CM | POA: Diagnosis not present

## 2021-07-10 DIAGNOSIS — G4489 Other headache syndrome: Secondary | ICD-10-CM | POA: Diagnosis not present

## 2021-07-10 DIAGNOSIS — R569 Unspecified convulsions: Secondary | ICD-10-CM | POA: Diagnosis not present

## 2021-07-10 DIAGNOSIS — I6529 Occlusion and stenosis of unspecified carotid artery: Secondary | ICD-10-CM | POA: Diagnosis not present

## 2021-07-10 DIAGNOSIS — R404 Transient alteration of awareness: Secondary | ICD-10-CM | POA: Diagnosis not present

## 2021-07-10 DIAGNOSIS — I6782 Cerebral ischemia: Secondary | ICD-10-CM | POA: Diagnosis not present

## 2021-07-10 DIAGNOSIS — R0902 Hypoxemia: Secondary | ICD-10-CM | POA: Diagnosis not present

## 2021-07-10 DIAGNOSIS — R400 Somnolence: Secondary | ICD-10-CM | POA: Diagnosis not present

## 2021-07-10 DIAGNOSIS — G319 Degenerative disease of nervous system, unspecified: Secondary | ICD-10-CM | POA: Diagnosis not present

## 2021-07-10 DIAGNOSIS — R52 Pain, unspecified: Secondary | ICD-10-CM | POA: Diagnosis not present

## 2021-08-08 ENCOUNTER — Encounter (HOSPITAL_COMMUNITY): Payer: Medicaid Other

## 2021-08-09 ENCOUNTER — Telehealth (HOSPITAL_COMMUNITY): Payer: Self-pay

## 2021-08-09 NOTE — Telephone Encounter (Signed)
Received a call from [name] reporting he was needing the number to call and reschedule his appointment in the clinic that he missed on Monday due to transportation being unable to reach him. I provided [name] with the number and he called to reschedule appointment.   I advised him I would assist in setting up transportation.    Currently he is not on paramedicine due to lack of communication, he lost his phone a few months ago after HF LCSW purchased him several as well as mins for the phone, he also got evicted from his original place so we had trouble getting in touch with him. The number he is calling from today is a friend that he does not live with. [name] reports he is trying to get the money to get him a new phone but has my number and will call me when it gets closer to his appointment so we can schedule his transportation. [name] understands paramedicine cannot be re-started until he has been seen in the clinic.   He understands and agrees. He states he is taking his medications and is staying at a boarding house in [place].   I will assist when he calls to set up ride for appointment in clinic on aug. 29th.   Call complete.

## 2021-08-22 ENCOUNTER — Encounter (HOSPITAL_COMMUNITY): Payer: Medicaid Other

## 2021-08-31 ENCOUNTER — Telehealth (HOSPITAL_COMMUNITY): Payer: Self-pay | Admitting: Licensed Clinical Social Worker

## 2021-08-31 NOTE — Telephone Encounter (Signed)
CSW called pt to check in.  Pt now staying at new address (updated in chart) and has working phone number so would like to get re-established with clinic so he can get back on all his medications.  CSW messaged scheduler to help get appt and CSW will follow to help address transportation barriers  Will continue to follow and assist as needed  Burna Sis, LCSW Clinical Social Worker [facility] Desk#: [phone] Cell#: [phone]

## 2021-09-02 ENCOUNTER — Telehealth (HOSPITAL_COMMUNITY): Payer: Self-pay | Admitting: Licensed Clinical Social Worker

## 2021-09-02 NOTE — Progress Notes (Signed)
Heart and Vascular Care Navigation  09/02/2021  Deep Bonawitz [birth year] [id number]  Reason for Referral: Recent missed appts due to instable housing so needs assistance re-establishing with clinic and getting transportation to appt.   Engaged with patient by telephone for follow up visit for Heart and Vascular Care Coordination.                                                                                                   Assessment:     Pt had lost previous housing in the spring after eviction and had been homeless and without a phone so we had lost communication with him over the summer.  Pt has new phone and is wanting to get reestablished with clinic as he has had trouble keeping up with his medications.                                  HRT/VAS Care Coordination     Outpatient Care Team Community ACT Team; Social Worker   Community ACT Team Member Name: [name]- [phone]   Social Worker Name: [name]- [facility]- [phone]   Living arrangements for the past 2 months Single Family Home- renting a garage room for $400/month   Lives with: Self   Patient Current Insurance Coverage Medicaid   Patient Has Concern With Paying Medical Bills No   Does Patient Have Prescription Coverage? Yes   Home Assistive Devices/Equipment Cane (specify quad or straight); Walker (specify type)   DME Agency [employer]   [facility] Agency [facility]       Social History:                                                                             SDOH Screenings   Alcohol Screen: Not on file  Depression (PHQ2-9): Not on file  Financial Resource Strain: High Risk   Difficulty of Paying Living Expenses: Hard  Food Insecurity: Not on file  Housing: High Risk   Last Housing Risk Score: 2  Physical Activity: Not on file  Social Connections: Not on file  Stress: Not on file  Tobacco Use: Low Risk    Smoking Tobacco Use: Never   Smokeless Tobacco  Use: Never  Transportation Needs: Unmet Transportation Needs   Lack of Transportation (Medical): Yes   Lack of Transportation (Non-Medical): Yes    SDOH Interventions: Financial Resources:    Pt gets Therapist, occupational Insecurity:   Gets food stamps  Housing Insecurity:   None reported at this time  Transportation:   Transportation Interventions: Other (Comment) (Assisted in setting up Medicaid Transport)    Follow-up plan:    CSW had clinic schedule appt for pt on 9/20- CSW called  Medicaid transport and set up ride to appt  Burna Sis, LCSW Clinical Social Worker [facility] Desk#: [phone] Cell#: [phone]

## 2021-09-02 NOTE — Telephone Encounter (Signed)
Medicaid transport set up for pt appt on 9/20  Pick up from home: 9:15-9:45am Pick up from clinic: 11:30-12pm  Will continue to follow and assist as needed  Burna Sis, LCSW Clinical Social Worker [facility] Desk#: [phone] Cell#: [phone]

## 2021-09-04 DIAGNOSIS — M7989 Other specified soft tissue disorders: Secondary | ICD-10-CM | POA: Diagnosis not present

## 2021-09-04 DIAGNOSIS — R0902 Hypoxemia: Secondary | ICD-10-CM | POA: Diagnosis not present

## 2021-09-04 DIAGNOSIS — M79631 Pain in right forearm: Secondary | ICD-10-CM | POA: Diagnosis not present

## 2021-09-04 DIAGNOSIS — S50811A Abrasion of right forearm, initial encounter: Secondary | ICD-10-CM | POA: Diagnosis not present

## 2021-09-04 DIAGNOSIS — L03113 Cellulitis of right upper limb: Secondary | ICD-10-CM | POA: Diagnosis not present

## 2021-09-04 DIAGNOSIS — S0081XA Abrasion of other part of head, initial encounter: Secondary | ICD-10-CM | POA: Diagnosis not present

## 2021-09-04 DIAGNOSIS — R42 Dizziness and giddiness: Secondary | ICD-10-CM | POA: Diagnosis not present

## 2021-09-04 DIAGNOSIS — R52 Pain, unspecified: Secondary | ICD-10-CM | POA: Diagnosis not present

## 2021-09-04 DIAGNOSIS — S50311A Abrasion of right elbow, initial encounter: Secondary | ICD-10-CM | POA: Diagnosis not present

## 2021-09-04 DIAGNOSIS — S4991XA Unspecified injury of right shoulder and upper arm, initial encounter: Secondary | ICD-10-CM | POA: Diagnosis not present

## 2021-09-04 DIAGNOSIS — R531 Weakness: Secondary | ICD-10-CM | POA: Diagnosis not present

## 2021-09-05 ENCOUNTER — Telehealth (HOSPITAL_COMMUNITY): Payer: Self-pay | Admitting: Licensed Clinical Social Worker

## 2021-09-05 DIAGNOSIS — A4101 Sepsis due to Methicillin susceptible Staphylococcus aureus: Secondary | ICD-10-CM | POA: Diagnosis not present

## 2021-09-05 DIAGNOSIS — L02413 Cutaneous abscess of right upper limb: Secondary | ICD-10-CM | POA: Diagnosis not present

## 2021-09-05 DIAGNOSIS — L03113 Cellulitis of right upper limb: Secondary | ICD-10-CM | POA: Diagnosis not present

## 2021-09-05 NOTE — Telephone Encounter (Signed)
CSW attempted to call pt to inform of clinic appt next week and transport information- unable to reach- left VM requesting return call  Burna Sis, LCSW Clinical Social Worker [facility] Desk#: [phone] Cell#: [phone]

## 2021-09-06 DIAGNOSIS — L02413 Cutaneous abscess of right upper limb: Secondary | ICD-10-CM | POA: Diagnosis not present

## 2021-09-06 DIAGNOSIS — A4101 Sepsis due to Methicillin susceptible Staphylococcus aureus: Secondary | ICD-10-CM | POA: Diagnosis not present

## 2021-09-06 DIAGNOSIS — L03113 Cellulitis of right upper limb: Secondary | ICD-10-CM | POA: Diagnosis not present

## 2021-09-07 ENCOUNTER — Telehealth (HOSPITAL_COMMUNITY): Payer: Self-pay | Admitting: Licensed Clinical Social Worker

## 2021-09-07 DIAGNOSIS — L02413 Cutaneous abscess of right upper limb: Secondary | ICD-10-CM | POA: Diagnosis not present

## 2021-09-07 DIAGNOSIS — A4101 Sepsis due to Methicillin susceptible Staphylococcus aureus: Secondary | ICD-10-CM | POA: Diagnosis not present

## 2021-09-07 DIAGNOSIS — L03113 Cellulitis of right upper limb: Secondary | ICD-10-CM | POA: Diagnosis not present
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
## 2021-09-08 DIAGNOSIS — L02413 Cutaneous abscess of right upper limb: Secondary | ICD-10-CM | POA: Diagnosis not present

## 2021-09-08 DIAGNOSIS — M7989 Other specified soft tissue disorders: Secondary | ICD-10-CM | POA: Diagnosis not present

## 2021-09-08 DIAGNOSIS — A419 Sepsis, unspecified organism: Secondary | ICD-10-CM

## 2021-09-08 DIAGNOSIS — M79601 Pain in right arm: Secondary | ICD-10-CM | POA: Diagnosis not present

## 2021-09-08 DIAGNOSIS — M25521 Pain in right elbow: Secondary | ICD-10-CM | POA: Diagnosis not present

## 2021-09-08 DIAGNOSIS — T797XXA Traumatic subcutaneous emphysema, initial encounter: Secondary | ICD-10-CM | POA: Diagnosis not present

## 2021-09-08 DIAGNOSIS — A4101 Sepsis due to Methicillin susceptible Staphylococcus aureus: Secondary | ICD-10-CM | POA: Diagnosis not present

## 2021-09-08 DIAGNOSIS — L03113 Cellulitis of right upper limb: Secondary | ICD-10-CM | POA: Diagnosis not present

## 2021-09-09 DIAGNOSIS — L02413 Cutaneous abscess of right upper limb: Secondary | ICD-10-CM | POA: Diagnosis not present

## 2021-09-09 DIAGNOSIS — L03113 Cellulitis of right upper limb: Secondary | ICD-10-CM | POA: Diagnosis not present

## 2021-09-09 DIAGNOSIS — A4101 Sepsis due to Methicillin susceptible Staphylococcus aureus: Secondary | ICD-10-CM | POA: Diagnosis not present

## 2021-09-10 ENCOUNTER — Telehealth: Payer: Self-pay
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
## 2021-09-10 NOTE — Telephone Encounter (Signed)
Transition Care Management Unsuccessful Follow-up Telephone Call  Date of discharge and from where:  09/09/2021 from [name]  Attempts:  1st Attempt  Reason for unsuccessful TCM follow-up call:  Unable to leave message

## 2021-09-11 NOTE — Telephone Encounter (Signed)
Transition Care Management Unsuccessful Follow-up Telephone Call  Date of discharge and from where:  09/09/2021 from [facility]  Attempts:  2nd Attempt  Reason for unsuccessful TCM follow-up call:  Unable to leave message

## 2021-09-12 ENCOUNTER — Telehealth (HOSPITAL_COMMUNITY): Payer: Self-pay | Admitting: Licensed Clinical Social Worker

## 2021-09-12 NOTE — Telephone Encounter (Signed)
CSW called pt to remind of appt tomorrow- unable to reach- unable to leave message  Will continue to attempt contact  Burna Sis, LCSW Clinical Social Worker [facility] Desk#: [phone] Cell#: [phone]

## 2021-09-12 NOTE — Telephone Encounter (Signed)
Transition Care Management Unsuccessful Follow-up Telephone Call  Date of discharge and from where:  0916/2022 from [facility]  Attempts:  3rd Attempt  Reason for unsuccessful TCM follow-up call:  Unable to reach patient

## 2021-09-13 ENCOUNTER — Telehealth (HOSPITAL_COMMUNITY): Payer: Self-pay

## 2021-09-13 ENCOUNTER — Encounter (HOSPITAL_COMMUNITY): Payer: Self-pay

## 2021-09-13 ENCOUNTER — Ambulatory Visit (HOSPITAL_COMMUNITY)
Admission: RE | Admit: 2021-09-13 | Discharge: 2021-09-13 | Disposition: A | Discharge status: Home / Self Care | Payer: Medicaid Other | Source: Ambulatory Visit | Attending: Family Medicine | Admitting: Family Medicine

## 2021-09-13 ENCOUNTER — Ambulatory Visit (INDEPENDENT_AMBULATORY_CARE_PROVIDER_SITE_OTHER): Payer: Medicaid Other

## 2021-09-13 ENCOUNTER — Other Ambulatory Visit: Payer: Self-pay

## 2021-09-13 VITALS — BP 120/79 | HR 88 | Wt 135.8 lb

## 2021-09-13 DIAGNOSIS — I110 Hypertensive heart disease with heart failure: Secondary | ICD-10-CM | POA: Diagnosis present

## 2021-09-13 DIAGNOSIS — R569 Unspecified convulsions: Secondary | ICD-10-CM | POA: Insufficient documentation

## 2021-09-13 DIAGNOSIS — Z951 Presence of aortocoronary bypass graft: Secondary | ICD-10-CM | POA: Diagnosis not present

## 2021-09-13 DIAGNOSIS — Z596 Low income: Secondary | ICD-10-CM | POA: Diagnosis not present

## 2021-09-13 DIAGNOSIS — I5022 Chronic systolic (congestive) heart failure: Secondary | ICD-10-CM | POA: Diagnosis not present

## 2021-09-13 DIAGNOSIS — Z9119 Patient's noncompliance with other medical treatment and regimen: Secondary | ICD-10-CM | POA: Diagnosis not present

## 2021-09-13 DIAGNOSIS — I469 Cardiac arrest, cause unspecified: Secondary | ICD-10-CM

## 2021-09-13 DIAGNOSIS — L03113 Cellulitis of right upper limb: Secondary | ICD-10-CM | POA: Diagnosis not present

## 2021-09-13 DIAGNOSIS — Z8616 Personal history of COVID-19: Secondary | ICD-10-CM | POA: Diagnosis not present

## 2021-09-13 DIAGNOSIS — Z8249 Family history of ischemic heart disease and other diseases of the circulatory system: Secondary | ICD-10-CM | POA: Insufficient documentation

## 2021-09-13 DIAGNOSIS — Z79899 Other long term (current) drug therapy: Secondary | ICD-10-CM | POA: Diagnosis not present

## 2021-09-13 DIAGNOSIS — I252 Old myocardial infarction: Secondary | ICD-10-CM | POA: Diagnosis not present

## 2021-09-13 DIAGNOSIS — Z8679 Personal history of other diseases of the circulatory system: Secondary | ICD-10-CM

## 2021-09-13 DIAGNOSIS — Z95810 Presence of automatic (implantable) cardiac defibrillator: Secondary | ICD-10-CM | POA: Diagnosis not present

## 2021-09-13 DIAGNOSIS — I2510 Atherosclerotic heart disease of native coronary artery without angina pectoris: Secondary | ICD-10-CM | POA: Diagnosis not present

## 2021-09-13 DIAGNOSIS — F1010 Alcohol abuse, uncomplicated: Secondary | ICD-10-CM | POA: Diagnosis not present

## 2021-09-13 DIAGNOSIS — Z91199 Patient's noncompliance with other medical treatment and regimen due to unspecified reason: Secondary | ICD-10-CM

## 2021-09-13 LAB — CBC
HCT: 34.4 % — ABNORMAL LOW (ref 39.0–52.0)
Hemoglobin: 11.2 g/dL — ABNORMAL LOW (ref 13.0–17.0)
MCH: 32.2 pg (ref 26.0–34.0)
MCHC: 32.6 g/dL (ref 30.0–36.0)
MCV: 98.9 fL (ref 80.0–100.0)
Platelets: 319 10*3/uL (ref 150–400)
RBC: 3.48 MIL/uL — ABNORMAL LOW (ref 4.22–5.81)
RDW: 12.2 % (ref 11.5–15.5)
WBC: 7.4 10*3/uL (ref 4.0–10.5)
nRBC: 0 % (ref 0.0–0.2)

## 2021-09-13 LAB — BASIC METABOLIC PANEL
Anion gap: 10 (ref 5–15)
BUN: 12 mg/dL (ref 6–20)
CO2: 25 mmol/L (ref 22–32)
Calcium: 8.8 mg/dL — ABNORMAL LOW (ref 8.9–10.3)
Chloride: 102 mmol/L (ref 98–111)
Creatinine, Ser: 1.3 mg/dL — ABNORMAL HIGH (ref 0.61–1.24)
GFR, Estimated: >60 mL/min (ref >60)
Glucose, Bld: 76 mg/dL (ref 70–99)
Potassium: 4 mmol/L (ref 3.5–5.1)
Sodium: 137 mmol/L (ref 135–145)

## 2021-09-13 LAB — ETHANOL: Alcohol, Ethyl (B): <10 mg/dL (ref <10)

## 2021-09-13 MED ORDER — DOXYCYCLINE HYCLATE 50 MG PO CAPS: 100.0000 mg | ORAL_CAPSULE | Freq: Two times a day (BID) | ORAL | 0 refills | Status: DC

## 2021-09-13 NOTE — Patient Instructions (Addendum)
Labs were performed today, if any labs are abnormal the clinic will call you  Your physician recommends that you schedule a follow-up appointment in: in 10-14 days  START Doxycycline 100 mg (2 tablets) 2 times a day for 5 days ONLY  STAY OUT OF THE SUN WHILE ON THE ANTIBIOTIC  At the [facility], you and your health needs are our priority. As part of our continuing mission to provide you with exceptional heart care, we have created designated Provider Care Teams. These Care Teams include your primary Cardiologist (physician) and Advanced Practice Providers (APPs- Physician Assistants and Nurse Practitioners) who all work together to provide you with the care you need, when you need it.   You may see any of the following providers on your designated Care Team at your next follow up: Dr Arvilla Meres Dr Marca Ancona Dr Brandon Melnick, NP Robbie Lis, [place] Mikki Santee Karle Plumber, PharmD   Please be sure to bring in all your medications bottles to every appointment.    If you have any questions or concerns before your next appointment please send [place] a message through [place] or call our office at [phone].    TO LEAVE A MESSAGE FOR THE NURSE SELECT OPTION 2, PLEASE LEAVE A MESSAGE INCLUDING: YOUR NAME DATE OF BIRTH CALL BACK NUMBER REASON FOR CALL**this is important as we prioritize the call backs  YOU WILL RECEIVE A CALL BACK THE SAME DAY AS LONG AS YOU CALL BEFORE 4:00 PM

## 2021-09-13 NOTE — Telephone Encounter (Signed)
Attempted to reach Mr. Roudebush for appointment reminder. No success. Will continue to reach out.

## 2021-09-14 ENCOUNTER — Telehealth (HOSPITAL_COMMUNITY): Payer: Self-pay | Admitting: Licensed Clinical Social Worker

## 2021-09-14 LAB — CUP PACEART REMOTE DEVICE CHECK
Battery Remaining Longevity: 174 mo
Battery Remaining Percentage: 100 %
Brady Statistic RV Percent Paced: 0 %
Date Time Interrogation Session: 20220920095100
HighPow Impedance: 60 Ohm
Implantable Lead Implant Date: 20200904
Implantable Lead Location: 753860
Implantable Lead Model: 293
Implantable Lead Serial Number: 443717
Implantable Pulse Generator Implant Date: 20200904
Lead Channel Impedance Value: 406 Ohm
Lead Channel Setting Pacing Amplitude: 2.5 V
Lead Channel Setting Pacing Pulse Width: 0.4 ms
Lead Channel Setting Sensing Sensitivity: 0.5 mV
Pulse Gen Serial Number: 271403

## 2021-09-14 NOTE — Telephone Encounter (Signed)
Pt re-referred to [employer].  Referral sent out to paramedics for assignment.  Will continue to follow and assist as needed  Burna Sis, LCSW Clinical Social Worker [facility] Desk#: [phone] Cell#: [phone]

## 2021-09-16 ENCOUNTER — Telehealth (HOSPITAL_COMMUNITY): Payer: Self-pay

## 2021-09-16 NOTE — Telephone Encounter (Signed)
Attempted to reach Mr. Gaffin via cell phone today to set up home visit for [facility] on Tuesday. I will continue reaching out next week to set this up.

## 2021-09-19 ENCOUNTER — Telehealth (HOSPITAL_COMMUNITY): Payer: Self-pay | Admitting: Licensed Clinical Social Worker

## 2021-09-19 NOTE — Telephone Encounter (Signed)
CSW called [facility] Medicaid transport to set up ride for pt to appt next week:  Pick up from home 9:45am-10:15am Pick up from clinic 12-12:30pm Confirmation number [phone]  CSW attempted to call pt to inform but unable to reach or leave VM- informed paramedic who is planning on seeing pt this week so they can inform pt if he does not return call  Will continue to follow and assist as needed  Burna Sis, LCSW Clinical Social Worker [facility] Desk#: [phone] Cell#: [phone]

## 2021-09-20 ENCOUNTER — Telehealth (HOSPITAL_COMMUNITY): Payer: Self-pay

## 2021-09-20 ENCOUNTER — Other Ambulatory Visit (HOSPITAL_COMMUNITY): Payer: Self-pay

## 2021-09-20 NOTE — Progress Notes (Signed)
Paramedicine Encounter    Patient ID: [name], male    DOB: August 21, 1961, 60 y.o.   MRN: [id number]   Patient Care Team: Claiborne Rigg, NP as PCP - General (Nurse Practitioner) Marinus Maw, MD as PCP - Electrophysiology (Cardiology) Burna Sis, LCSW as Social Worker (Licensed Clinical Social Worker)  Patient Active Problem List   Diagnosis Date Noted   Closed fracture of right upper limb 01/27/2021   Pneumonia due to COVID-19 virus 08/14/2020   Hyponatremia 08/14/2020   Elevated AST (SGOT) 08/14/2020   Prolonged QT interval 08/14/2020   Cardiac arrest, cause unspecified (HCC) 07/06/2020   Left arm cellulitis 05/19/2020   Essential hypertension 05/18/2020   Mixed hyperlipidemia 05/18/2020   Alcohol abuse 05/18/2020   Cellulitis of left upper extremity 05/18/2020   Lactic acidosis 05/18/2020   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, 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) 12/30/2019   ICD (implantable cardioverter-defibrillator) in place 08/29/2019   Chronic systolic heart failure (HCC) 08/25/2019   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/12/2017   Coronary artery disease involving native coronary artery of native heart without angina pectoris 12/07/2017    Current Outpatient Medications:    atorvastatin (LIPITOR) 80 MG tablet, Take 1 tablet (80 mg total) by mouth daily at 6 PM., Disp: 90 tablet, Rfl: 3   carvedilol (COREG) 6.25 MG tablet, Take 1 tablet (6.25 mg total) by mouth 2 (two) times daily with a meal., Disp: 180 tablet, Rfl: 3   doxycycline (VIBRAMYCIN) 50 MG capsule, Take 2 capsules (100 mg total) by mouth 2 (two) times daily., Disp: 20 capsule, Rfl: 0   eplerenone (INSPRA) 25 MG tablet, Take 1 tablet (25 mg total) by mouth daily., Disp: 30 tablet, Rfl: 3   ezetimibe (ZETIA) 10 MG tablet, Take 1 tablet (10 mg total) by mouth daily., Disp: 30 tablet, Rfl: 6   ivabradine (CORLANOR) 5 MG TABS tablet, Take 1 tablet (5 mg total) by mouth 2 (two) times  daily with a meal., Disp: 30 tablet, Rfl: 3   levETIRAcetam (KEPPRA) 500 MG tablet, TAKE 1 TABLET BY MOUTH 2 TIMES DAILY, Disp: 180 tablet, Rfl: 3   sacubitril-valsartan (ENTRESTO) 24-26 MG, Take 1 tablet by mouth 2 (two) times daily., Disp: 60 tablet, Rfl: 6 Allergies  Allergen Reactions   Spironolactone Other (See Comments)    Gynecomastia      Social History   Socioeconomic History   Marital status: Divorced    Spouse name: Not on file   Number of children: Not on file   Years of education: Not on file   Highest education level: Not on file  Occupational History   Not on file  Tobacco Use   Smoking status: Never   Smokeless tobacco: Never  Vaping Use   Vaping Use: Never used  Substance and Sexual Activity   Alcohol use: Yes    Alcohol/week: 14.0 standard drinks    Types: 14 Cans of beer per week    Comment: patient endorses at least 1-2 beers daily (04/2020)   Drug use: No   Sexual activity: Not Currently  Other Topics Concern   Not on file  Social History Narrative   Lives at home with his brother who is crippled. He takes care of his brother.    Right handed    Social Determinants of Health   Financial Resource Strain: High Risk   Difficulty of Paying Living Expenses: Hard  Food Insecurity: Not on file  Transportation Needs: Unmet  Regulatory affairs officer (Medical): Yes   Lack of Transportation (Non-Medical): Yes  Physical Activity: Not on file  Stress: Not on file  Social Connections: Not on file  Intimate Partner Violence: Not on file    Physical Exam Vitals reviewed.  Constitutional:      Appearance: Normal appearance. He is normal weight.  HENT:     Head: Normocephalic.     Nose: Nose normal.     Mouth/Throat:     Mouth: Mucous membranes are moist.     Pharynx: Oropharynx is clear.  Eyes:     Pupils: Pupils are equal, round, and reactive to light.  Cardiovascular:     Rate and Rhythm: Normal rate and regular rhythm.      Pulses: Normal pulses.     Heart sounds: Normal heart sounds.  Pulmonary:     Effort: Pulmonary effort is normal.     Breath sounds: Normal breath sounds.  Abdominal:     General: Abdomen is flat.     Palpations: Abdomen is soft.  Musculoskeletal:        General: No swelling. Normal range of motion.     Cervical back: Normal range of motion.     Right lower leg: No edema.     Left lower leg: No edema.  Skin:    General: Skin is warm and dry.     Capillary Refill: Capillary refill takes less than 2 seconds.  Neurological:     General: No focal deficit present.     Mental Status: He is alert. Mental status is at baseline.  Psychiatric:        Mood and Affect: Mood normal.   Arrived for home visit for [facility] who reports feeling short of breath upon walking with some chest tightness sometimes. [name] has been without his medications for several weeks, he reports he has not gone to pick them up after his HF visit last week. Mikel reports feeling okay while at rest and denied dizziness, or shortness of breath while sitting or standing still. Vitals and assessment are as noted in report. [name] was reminded of his appointment in clinic next week and given written instructions for his pick up times and appointment times. He was instructed to pick up a list of written medications at [facility] and to bring them to his clinic appointment next week where I will meet him. [name] agreed with plans.   Social: -Living in a shed with no electricity at [address] in [name].  -Check is $850/mo -Rent is $400/mo -Phone is $25/mo -FoodStamps is $140/mo  Gets paid on the 1st and receives FS on the 15th.  MEDS TO PICK UP -Atorvastatin -Carvedilol -Inspra -Keppra -Corlanor -Zetia -Entresto  -Cetrizine      Future Appointments  Date Time Provider Department Center  09/27/2021 11:00 AM [facility] PA/NP [facility] None  12/13/2021 11:05 AM [facility] DEVICE REMOTES [facility]   03/14/2022 11:05 AM [facility] DEVICE REMOTES [facility]  06/13/2022 11:05 AM [facility] DEVICE REMOTES [facility]  09/12/2022 11:05 AM [facility] DEVICE REMOTES [facility]  12/12/2022 11:05 AM [facility] DEVICE REMOTES [facility]     ACTION: Home visit completed

## 2021-09-20 NOTE — Progress Notes (Signed)
Remote ICD transmission.   

## 2021-09-20 NOTE — Telephone Encounter (Signed)
Attempted to reach [name] with no success this morning for a home visit, I will continue trying.

## 2021-09-26 ENCOUNTER — Telehealth (HOSPITAL_COMMUNITY): Payer: Self-pay | Admitting: Licensed Clinical Social Worker

## 2021-09-26 ENCOUNTER — Telehealth (HOSPITAL_COMMUNITY): Payer: Self-pay

## 2021-09-26 NOTE — Telephone Encounter (Signed)
Attempted to reach [facility] for appointment reminder via cell phone with no success. I will continue to follow up.

## 2021-09-26 NOTE — Progress Notes (Signed)
ADVANCED HF CLINIC NOTE  PCP: Bertram Denver, NP HF Cardiologist: Dr Gala Romney   Reason for Visit: F/u for chronic systolic heart failure   HPI: [name] is a 60 y.o. male with h/o ETOH abuse, VF Arrest 2018, CAD s/p CABG 12/07/17, and ICM with EF 25-30%.   Admitted 12/18 with VF arrest while chopping wood. Received CPR in the field. R/LHC with severe 3vD as below and cardiogenic shock requiring IABP and milrinone. Required milrinone with continued shock. Pt improved and IABP removed 12/10, but pt had recurrent VF arrest so IABP replaced. Stabilized and underwent CABG 12/07/17. Tolerated gradual wean of meds and extubation with no further events.  Echo 04/2019  LVEF at 25%.    9/20 implantation of a [employer] single chamber ICD.   He had routine clinic f/u on 10/23/19 and while in exam room had a witnessed seizure  with loss of pulse. CPR started with quick ROSC. He did not require shock. Taken to the ED and had another seizure. Neurology consulted. Started on Keppra. ICD interrogated. No arrhythmias noted. Admitted to [facility] on 12/09/19 with recurrent seizures in setting of noncompliance with his Keppra.    Admitted 8/21 with COVID PNA.   Follow up 5/22 he was having facial and arm tingling, no other neuro symptoms. Ethanol level 145. Carvedilol increased and carotid u/s ordered showing bilateral ICA 1-39%.  Follow up 9/22 he was off all of his medications. Resumed meds and re-enrolled with [facility].  Today he returns for HF follow up with [facility]. He continues to ride his bike and drinks "a couple beers a day." Some dyspnea with activity. Overall feeling fine. Denies CP, dizziness, edema, or PND/Orthopnea. Appetite ok. No fever or chills. He has not picked up his medications from the pharmacy since our last appointment.  ROS: All systems negative except as listed in HPI, PMH and Problem List.  SH:  Social History   Socioeconomic  History   Marital status: Divorced    Spouse name: Not on file   Number of children: Not on file   Years of education: Not on file   Highest education level: Not on file  Occupational History   Not on file  Tobacco Use   Smoking status: Never   Smokeless tobacco: Never  Vaping Use   Vaping Use: Never used  Substance and Sexual Activity   Alcohol use: Yes    Alcohol/week: 14.0 standard drinks    Types: 14 Cans of beer per week    Comment: patient endorses at least 1-2 beers daily (04/2020)   Drug use: No   Sexual activity: Not Currently  Other Topics Concern   Not on file  Social History Narrative   Lives at home with his brother who is crippled. He takes care of his brother.    Right handed    Social Determinants of Health   Financial Resource Strain: High Risk   Difficulty of Paying Living Expenses: Hard  Food Insecurity: Not on file  Transportation Needs: Unmet Transportation Needs   Lack of Transportation (Medical): Yes   Lack of Transportation (Non-Medical): Yes  Physical Activity: Not on file  Stress: Not on file  Social Connections: Not on file  Intimate Partner Violence: Not on file   FH:  Family History  Problem Relation Age of Onset   Heart attack Other    Diabetes Neg Hx    Past Medical History:  Diagnosis  Date   AICD (automatic cardioverter/defibrillator) present    Alcohol abuse 05/18/2020   Arthritis    "hands; maybe my toes" (12/20/2017)   CAD (coronary artery disease)    Cardiac arrest (HCC) 11/29/2017   hx VF arrest/notes 12/20/2017   CHF (congestive heart failure) (HCC)    Chronic systolic heart failure (HCC) 08/25/2019   Coronary artery disease    Coronary artery disease involving native coronary artery of native heart without angina pectoris 12/07/2017   Essential hypertension 05/18/2020   GERD (gastroesophageal reflux disease)    H/O ETOH abuse    /notes 12/20/2017   High cholesterol    Hypertension    Ischemic cardiomyopathy    a.  08/2019 s/p BSX D232 single lead AICD.   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 90 tablet 3   cetirizine (ZYRTEC) 10 MG tablet Take 10 mg by mouth daily.     eplerenone (INSPRA) 25 MG tablet Take 1 tablet (25 mg total) by mouth daily. 30 tablet 3   ezetimibe (ZETIA) 10 MG tablet Take 1 tablet (10 mg total) by mouth daily. 30 tablet 6   levETIRAcetam (KEPPRA) 500 MG tablet TAKE 1 TABLET BY MOUTH 2 TIMES DAILY 180 tablet 3   sacubitril-valsartan (ENTRESTO) 24-26 MG Take 1 tablet by mouth 2 (two) times daily. 60 tablet 6   No current facility-administered medications for this encounter.   BP 120/72   Pulse 87   Wt 62 kg (136 lb 9.6 oz)   SpO2 97%   BMI 19.60 kg/m   Wt Readings from Last 3 Encounters:  09/27/21 62 kg (136 lb 9.6 oz)  09/13/21 61.6 kg (135 lb 12.8 oz)  04/27/21 66.9 kg (147 lb 6.4 oz)   PHYSICAL EXAM: General:  NAD. No resp difficulty, chronically-ill appearing. HEENT: Normal Neck: Supple. No JVD. Carotids 2+ bilat; no bruits. No lymphadenopathy or thryomegaly appreciated. Cor: PMI nondisplaced. Regular rate & rhythm. No rubs, gallops or murmurs. Lungs: Clear Abdomen: Soft, nontender, nondistended. No hepatosplenomegaly. No bruits or masses. Good bowel sounds. Extremities: No cyanosis, clubbing, rash, edema Neuro: Alert & oriented x 3, cranial nerves grossly intact. Moves all 4 extremities w/o difficulty. Affect pleasant.  Device interrogation: HL Score 43, fluid up, 4.8 hrs/day of activity, no VT (personally reviewed).  ReDs:  30%  ASSESSMENT & PLAN: 1. Chronic systolic CHF with ICM - Echo 11/30/17 25-30% RV normal.   - Echo 03/2018: EF 40-45%  RV normal.  - Echo 5/20 EF ~ 25%. Has ICD.  - Echo 12/2019 EF 30-35% RV normal. - NYHA II, volume looks ok on exam but HL Score 43, weight stable, ReDs 30%. - He has not been on any of his medications since our last visit. - Continue PRN lasix 20 mg.  - Restart eplerenone 12.5 mg daily.  - Restart carvedilol 3.125 mg bid.  - Restart Entresto 24-26 mg bid. (He did not tolerate 49-51 mg dose due to hypotension).  - Previously on ivabradine, unsure when he last took this. Will hold off restarting today with  HR 87 and restarting beta blocker. - Not a candidate for advanced therapies due to ongoing alcohol abuse. - Labs today, will repeat at follow up.  2. CAD s/p CABG 12/07/2017 - Stable. No s/s angina  - Restart ASA, ? blocker, atorvastatin + zetia.   3. H/o VF arrest in setting of STEMI - Has ICD, followed by EP. - Device interrogation in clinic. HL score 43. Fluid trending up. Activity level 4.8 hr/day No VT   4. Seizures: - Denies any recent recurrence.  - Restart Keppra 500 mg bid.  - Followed by Neurology.    5. ETOH abuse  - Still drinking beer on daily basis.  - Again discussed need for alcohol cessation. He has no plans to quit.    6. H/O Noncompliance/ Poor Health Literacy  - Discharged previously from [facility] as they could not get in contact with him. Now back on board. - Discussed importance of strict compliance w/ meds. - Appreciate [facility]'s assistance.  He only picked up his medications today and has not been on any of his medications since his last visit. Restart meds as above. Will repeat labs at next appt.   Will keep close follow up to get him back on track with his medicines, follow up with APP in 2-3 weeks.  Jacklynn Ganong FNP 10:37 AM

## 2021-09-26 NOTE — Telephone Encounter (Signed)
Reached out to pt to remind of appt tomorrow- unable to reach- left VM requesting return call  Will continue to follow and assist as needed  Burna Sis, LCSW Clinical Social Worker [facility] Desk#: [phone] Cell#: [phone]

## 2021-09-26 NOTE — Telephone Encounter (Signed)
Called and left patient a detailed message of all the below information to confirm/remind patient of their appointment at the [facility] on 09/27/21 and to give our office a call back if needed.  Patient reminded to bring all medications and/or complete list.

## 2021-09-27 ENCOUNTER — Ambulatory Visit (HOSPITAL_COMMUNITY)
Admission: RE | Admit: 2021-09-27 | Discharge: 2021-09-27 | Disposition: A | Discharge status: Home / Self Care | Payer: Medicaid Other | Source: Ambulatory Visit | Attending: Cardiology | Admitting: Cardiology

## 2021-09-27 ENCOUNTER — Other Ambulatory Visit (HOSPITAL_COMMUNITY): Payer: Self-pay

## 2021-09-27 ENCOUNTER — Encounter (HOSPITAL_COMMUNITY): Payer: Self-pay

## 2021-09-27 ENCOUNTER — Other Ambulatory Visit: Payer: Self-pay

## 2021-09-27 VITALS — BP 120/72 | HR 87 | Wt 136.6 lb

## 2021-09-27 DIAGNOSIS — Z8616 Personal history of COVID-19: Secondary | ICD-10-CM | POA: Diagnosis not present

## 2021-09-27 DIAGNOSIS — Z7901 Long term (current) use of anticoagulants: Secondary | ICD-10-CM | POA: Diagnosis not present

## 2021-09-27 DIAGNOSIS — Z09 Encounter for follow-up examination after completed treatment for conditions other than malignant neoplasm: Secondary | ICD-10-CM | POA: Diagnosis not present

## 2021-09-27 DIAGNOSIS — R0600 Dyspnea, unspecified: Secondary | ICD-10-CM | POA: Diagnosis present

## 2021-09-27 DIAGNOSIS — Z8674 Personal history of sudden cardiac arrest: Secondary | ICD-10-CM | POA: Diagnosis not present

## 2021-09-27 DIAGNOSIS — Z9114 Patient's other noncompliance with medication regimen: Secondary | ICD-10-CM | POA: Insufficient documentation

## 2021-09-27 DIAGNOSIS — R569 Unspecified convulsions: Secondary | ICD-10-CM | POA: Diagnosis not present

## 2021-09-27 DIAGNOSIS — E782 Mixed hyperlipidemia: Secondary | ICD-10-CM | POA: Diagnosis not present

## 2021-09-27 DIAGNOSIS — Z5982 Transportation insecurity: Secondary | ICD-10-CM | POA: Insufficient documentation

## 2021-09-27 DIAGNOSIS — Z951 Presence of aortocoronary bypass graft: Secondary | ICD-10-CM | POA: Diagnosis not present

## 2021-09-27 DIAGNOSIS — F1010 Alcohol abuse, uncomplicated: Secondary | ICD-10-CM | POA: Diagnosis not present

## 2021-09-27 DIAGNOSIS — Z596 Low income: Secondary | ICD-10-CM | POA: Diagnosis not present

## 2021-09-27 DIAGNOSIS — G40909 Epilepsy, unspecified, not intractable, without status epilepticus: Secondary | ICD-10-CM | POA: Diagnosis not present

## 2021-09-27 DIAGNOSIS — I5022 Chronic systolic (congestive) heart failure: Secondary | ICD-10-CM

## 2021-09-27 DIAGNOSIS — I252 Old myocardial infarction: Secondary | ICD-10-CM | POA: Insufficient documentation

## 2021-09-27 DIAGNOSIS — I469 Cardiac arrest, cause unspecified: Secondary | ICD-10-CM

## 2021-09-27 DIAGNOSIS — I2510 Atherosclerotic heart disease of native coronary artery without angina pectoris: Secondary | ICD-10-CM | POA: Diagnosis not present

## 2021-09-27 DIAGNOSIS — I110 Hypertensive heart disease with heart failure: Secondary | ICD-10-CM | POA: Insufficient documentation

## 2021-09-27 DIAGNOSIS — Z79899 Other long term (current) drug therapy: Secondary | ICD-10-CM | POA: Insufficient documentation

## 2021-09-27 LAB — BASIC METABOLIC PANEL
Anion gap: 8 (ref 5–15)
BUN: 16 mg/dL (ref 6–20)
CO2: 24 mmol/L (ref 22–32)
Calcium: 9.1 mg/dL (ref 8.9–10.3)
Chloride: 106 mmol/L (ref 98–111)
Creatinine, Ser: 1.02 mg/dL (ref 0.61–1.24)
GFR, Estimated: >60 mL/min (ref >60)
Glucose, Bld: 84 mg/dL (ref 70–99)
Potassium: 3.6 mmol/L (ref 3.5–5.1)
Sodium: 138 mmol/L (ref 135–145)

## 2021-09-27 MED ORDER — EPLERENONE 25 MG PO TABS: 25.0000 mg | ORAL_TABLET | Freq: Every day | ORAL | 11 refills | Status: DC

## 2021-09-27 MED ORDER — ENTRESTO 24-26 MG PO TABS: 1.0000 | ORAL_TABLET | Freq: Two times a day (BID) | ORAL | 11 refills | Status: DC

## 2021-09-27 MED ORDER — ATORVASTATIN CALCIUM 80 MG PO TABS: 80.0000 mg | ORAL_TABLET | Freq: Every day | ORAL | 11 refills | Status: DC

## 2021-09-27 MED ORDER — CARVEDILOL 3.125 MG PO TABS: 3.1250 mg | ORAL_TABLET | Freq: Two times a day (BID) | ORAL | 11 refills | Status: DC

## 2021-09-27 MED ORDER — EZETIMIBE 10 MG PO TABS: 10.0000 mg | ORAL_TABLET | Freq: Every day | ORAL | 11 refills | Status: DC

## 2021-09-27 NOTE — Addendum Note (Signed)
Encounter addended by: Burna Sis, LCSW on: 09/27/2021 2:46 PM  Actions taken: Clinical Note Signed

## 2021-09-27 NOTE — Progress Notes (Signed)
CSW met with pt in room to check in and assist with changing address with [employer].  CSW on hold with [employer] for hour and unable to reach- pt transport waiting so provided pt with number to [employer] and [employer] in order for him to call and change himself.  Paramedic also met with pt in room and helped him get set up with 2 weeks of pillboxes.  Pt lives in garage with limited insulation and electricity so complains of it being cold.  Provided pt with 2 blankets and a new winter jacket as he lost his.  CSW will continue to follow and assist as needed   H. , LCSW Clinical Social Worker [facility] Desk#: [phone] Cell#: [phone]  

## 2021-09-27 NOTE — Progress Notes (Signed)
ReDS Vest / Clip - 09/27/21 1000       ReDS Vest / Clip   Station Marker C    Ruler Value 28    ReDS Value Range Low volume    ReDS Actual Value 30

## 2021-09-27 NOTE — Patient Instructions (Addendum)
Restart Zetia 10 mg, one tab daily Restart Atorvastatin 80 mg, one tab daily Restart Inspra  12.5mg , one tab daily Restart Carvedilol 3.125 mg, one tab twice daily Restart Entresto 24/26 mg, one tab twice daily   Labs today We will only contact you if something comes back abnormal or we need to make some changes. Otherwise no news is good news!  Your physician recommends that you schedule a follow-up appointment in: 2 weeks  in the Advanced Practition[facility] (PA/NP) Clinic   If you have any questions or concerns before your next appointment please send [place] a message through [place] or call our office at [phone].    TO LEAVE A MESSAGE FOR THE NURSE SELECT OPTION 2, PLEASE LEAVE A MESSAGE INCLUDING: YOUR NAME DATE OF BIRTH CALL BACK NUMBER REASON FOR CALL**this is important as we prioritize the call backs  YOU WILL RECEIVE A CALL BACK THE SAME DAY AS LONG AS YOU CALL BEFORE 4:00 PM

## 2021-09-27 NOTE — Progress Notes (Signed)
Paramedicine Encounter    Patient ID: [name], male    DOB: Aug 08, 1961, 60 y.o.   MRN: [id number]   Met with Kirk in clinic today where he was seen by Allena Katz NP. No med changes made today. I filled two weeks of pill boxes for [place] minus Carvedilol due to [facility] not filling it. Ishaaq knows to pick up same today and add to pill box, written instructions given as well. I will see him in two weeks.      ACTION: Home visit completed

## 2021-09-30 IMAGING — DX DG CHEST 1V
1 series · 1 of 1 positions shown · non-contrast
Comparison: 12/08/2019

CLINICAL DATA: Syncope

EXAM:
CHEST  1 VIEW

[chest ap]
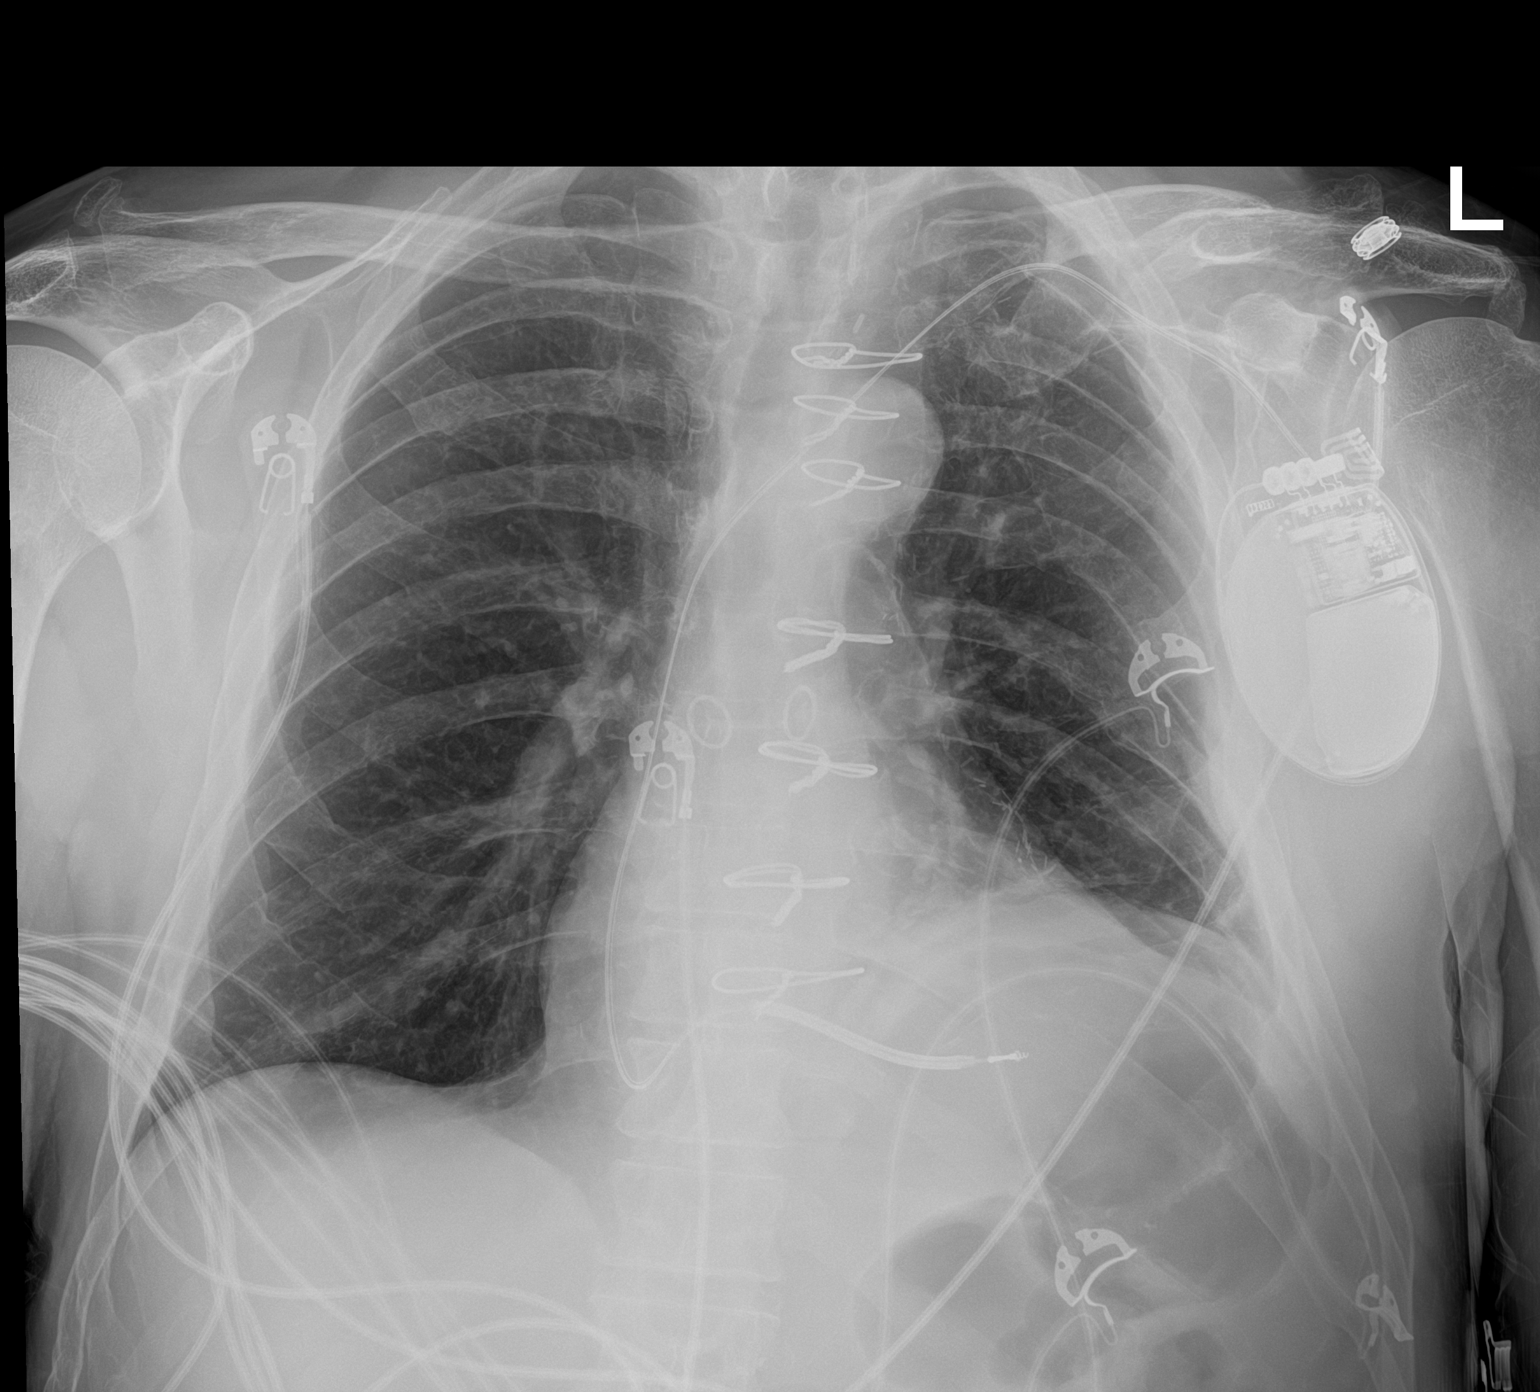

[1 of 1 positions shown; findings below may reference images not displayed]

FINDINGS: Post sternotomy changes. Similar appearance of left-sided pacing
device. Increasing airspace disease at the left base. Right lung is
clear. Stable cardiomediastinal silhouette. No pneumothorax. Old
appearing bilateral rib fractures.
IMPRESSION: Increasing airspace disease at left base, favor atelectasis though
developing infiltrate is also possible.

## 2021-10-07 ENCOUNTER — Telehealth (HOSPITAL_COMMUNITY): Payer: Self-pay | Admitting: Licensed Clinical Social Worker

## 2021-10-07 NOTE — Telephone Encounter (Signed)
CSW called [facility] to set up transport for pt appt next week:  Pick up from home: 7:45-8:15am Pick up after appt: 10am Confirmation # [id number]  CSW left message for pt to inform  Burna Sis, LCSW Clinical Social Worker [facility] Desk#: [phone] Cell#: [phone]

## 2021-10-11 ENCOUNTER — Encounter (HOSPITAL_COMMUNITY): Payer: Medicaid Other

## 2021-10-12 ENCOUNTER — Telehealth (HOSPITAL_COMMUNITY): Payer: Self-pay

## 2021-10-12 NOTE — Telephone Encounter (Signed)
Called and left patient a detailed message to confirm/remind patient of their appointment at the [facility] on 10/14/19.   I also left a message asking patient to bring all medications and/or complete list.

## 2021-10-12 NOTE — Telephone Encounter (Signed)
Attempted to reach [name] to confirm appointment and paramedicine visit for tomorrow. No answer. Voicemail left. I will meet him at clinic tomorrow at 0900.

## 2021-10-13 ENCOUNTER — Encounter (HOSPITAL_COMMUNITY): Payer: Medicaid Other

## 2021-10-13 DIAGNOSIS — R1010 Upper abdominal pain, unspecified: Secondary | ICD-10-CM | POA: Diagnosis not present

## 2021-10-13 DIAGNOSIS — R4182 Altered mental status, unspecified: Secondary | ICD-10-CM | POA: Diagnosis not present

## 2021-10-13 DIAGNOSIS — R569 Unspecified convulsions: Secondary | ICD-10-CM | POA: Diagnosis not present

## 2021-10-13 DIAGNOSIS — R079 Chest pain, unspecified: Secondary | ICD-10-CM | POA: Diagnosis not present

## 2021-10-13 DIAGNOSIS — R404 Transient alteration of awareness: Secondary | ICD-10-CM | POA: Diagnosis not present

## 2021-10-13 DIAGNOSIS — R0789 Other chest pain: Secondary | ICD-10-CM | POA: Diagnosis not present

## 2021-10-14 ENCOUNTER — Telehealth (HOSPITAL_COMMUNITY): Payer: Self-pay | Admitting: Licensed Clinical Social Worker

## 2021-10-14 NOTE — Telephone Encounter (Signed)
Attempted to call pt to discuss no-show to clinic appt.  Unable to reach- phone goes straight to VM- left message requesting return call  Will continue to follow and assist as needed  Burna Sis, LCSW Clinical Social Worker [facility] Desk#: [phone] Cell#: [phone]

## 2021-10-18 ENCOUNTER — Telehealth (HOSPITAL_COMMUNITY): Payer: Self-pay

## 2021-10-18 NOTE — Telephone Encounter (Signed)
Attempted to reach [name] for home visit with no success. Voicemail left. I will continue to reach out.

## 2021-10-20 ENCOUNTER — Telehealth (HOSPITAL_COMMUNITY): Payer: Self-pay | Admitting: Licensed Clinical Social Worker

## 2021-10-20 NOTE — Telephone Encounter (Signed)
CSW attempted to call pt to check in- unable to reach- left VM requesting return call  Burna Sis, LCSW Clinical Social Worker [facility] Desk#: [phone] Cell#: [phone]

## 2021-10-25 ENCOUNTER — Telehealth (HOSPITAL_COMMUNITY): Payer: Self-pay

## 2021-10-25 NOTE — Telephone Encounter (Signed)
Attempted to reach Mr. Degante via telephone to set up a home visit with no success. I will continue to reach out.

## 2021-10-28 ENCOUNTER — Telehealth (HOSPITAL_COMMUNITY): Payer: Self-pay | Admitting: Licensed Clinical Social Worker

## 2021-10-28 NOTE — Telephone Encounter (Signed)
CSW attempted to call pt to check in.  Phone not working at this time- unable to leave message.  Burna Sis, LCSW Clinical Social Worker [facility] Desk#: [phone] Cell#: [phone]

## 2021-11-01 ENCOUNTER — Telehealth (HOSPITAL_COMMUNITY): Payer: Self-pay

## 2021-11-01 NOTE — Telephone Encounter (Signed)
Attempted to reach [name] several times for paramedicine visit. Unsuccessful. Will discuss with APP's and LCSW.

## 2021-11-03 ENCOUNTER — Telehealth (HOSPITAL_COMMUNITY): Payer: Self-pay | Admitting: Licensed Clinical Social Worker

## 2021-11-03 NOTE — Telephone Encounter (Signed)
Pt being DC'd from [employer] program at this time as we have been unable to communicate with him for about a month.  If pt re- establishes with clinic we can reconsider for enrollment  Will continue to follow through clinic and assist as needed  Burna Sis, LCSW Clinical Social Worker [facility] Desk#: [phone] Cell#: [phone]

## 2021-12-08 ENCOUNTER — Telehealth (HOSPITAL_COMMUNITY): Payer: Self-pay | Admitting: Licensed Clinical Social Worker

## 2021-12-08 NOTE — Telephone Encounter (Signed)
CSW received call from pt who now has a new phone- number updated in chart.  Pt reports he is still at same address but is now staying in the main home where there is running water- continues to be caregiver for his girlfriend who is reportedly paralyzed  Would like to get back into clinic and back on his medications- messaged clinic staff regarding getting new appt  Will continue to follow and assist as needed  Burna Sis, LCSW Clinical Social Worker [facility] Desk#: [phone] Cell#: [phone]

## 2021-12-28 ENCOUNTER — Telehealth (HOSPITAL_COMMUNITY): Payer: Self-pay | Admitting: Licensed Clinical Social Worker

## 2021-12-28 NOTE — Telephone Encounter (Signed)
Pt had left message for CSW requesting help with getting food stamps figured out- was either stolen or lost.  CSW attempted to call pt back- phone states it is not working at this time- unable to leave a message  Burna Sis, LCSW Clinical Social Worker [facility] Desk#: [phone] Cell#: [phone]

## 2022-01-11 NOTE — Progress Notes (Incomplete)
ADVANCED HF CLINIC NOTE  PCP: Bertram Denver, NP HF Cardiologist: Dr Gala Romney   Reason for Visit: F/u for chronic systolic heart failure   HPI: [name] is a 61 y.o. male with h/o ETOH abuse, VF Arrest 2018, CAD s/p CABG 12/07/17, and ICM with EF 25-30%.   Admitted 12/18 with VF arrest while chopping wood. Received CPR in the field. R/LHC with severe 3vD as below and cardiogenic shock requiring IABP and milrinone. Required milrinone with continued shock. Pt improved and IABP removed 12/10, but pt had recurrent VF arrest so IABP replaced. Stabilized and underwent CABG 12/07/17. Tolerated gradual wean of meds and extubation with no further events.  Echo 04/2019  LVEF at 25%.    9/20 implantation of a [employer] single chamber ICD.   He had routine clinic f/u on 10/23/19 and while in exam room had a witnessed seizure  with loss of pulse. CPR started with quick ROSC. He did not require shock. Taken to the ED and had another seizure. Neurology consulted. Started on Keppra. ICD interrogated. No arrhythmias noted. Admitted to [facility] on 12/09/19 with recurrent seizures in setting of noncompliance with his Keppra.    Admitted 8/21 with COVID PNA.   Follow up 5/22 he was having facial and arm tingling, no other neuro symptoms. Ethanol level 145. Carvedilol increased and carotid u/s ordered showing bilateral ICA 1-39%.  Follow up 9/22 he was off all of his medications. Resumed meds and re-enrolled with [facility].  Today he returns for HF follow up with [facility]. He continues to ride his bike and drinks "a couple beers a day." Some dyspnea with activity. Overall feeling fine. Denies CP, dizziness, edema, or PND/Orthopnea. Appetite ok. No fever or chills. He has not picked up his medications from the pharmacy since our last appointment.  ROS: All systems negative except as listed in HPI, PMH and Problem List.  SH:  Social History   Socioeconomic  History   Marital status: Divorced    Spouse name: Not on file   Number of children: Not on file   Years of education: Not on file   Highest education level: Not on file  Occupational History   Not on file  Tobacco Use   Smoking status: Never   Smokeless tobacco: Never  Vaping Use   Vaping Use: Never used  Substance and Sexual Activity   Alcohol use: Yes    Alcohol/week: 14.0 standard drinks    Types: 14 Cans of beer per week    Comment: patient endorses at least 1-2 beers daily (04/2020)   Drug use: No   Sexual activity: Not Currently  Other Topics Concern   Not on file  Social History Narrative   Lives at home with his brother who is crippled. He takes care of his brother.    Right handed    Social Determinants of Health   Financial Resource Strain: High Risk   Difficulty of Paying Living Expenses: Hard  Food Insecurity: Not on file  Transportation Needs: Unmet Transportation Needs   Lack of Transportation (Medical): Yes   Lack of Transportation (Non-Medical): Yes  Physical Activity: Not on file  Stress: Not on file  Social Connections: Not on file  Intimate Partner Violence: Not on file   FH:  Family History  Problem Relation Age of Onset   Heart attack Other    Diabetes Neg Hx    Past Medical History:  Diagnosis  Date   AICD (automatic cardioverter/defibrillator) present    Alcohol abuse 05/18/2020   Arthritis    "hands; maybe my toes" (12/20/2017)   CAD (coronary artery disease)    Cardiac arrest (HCC) 11/29/2017   hx VF arrest/notes 12/20/2017   CHF (congestive heart failure) (HCC)    Chronic systolic heart failure (HCC) 08/25/2019   Coronary artery disease    Coronary artery disease involving native coronary artery of native heart without angina pectoris 12/07/2017   Essential hypertension 05/18/2020   GERD (gastroesophageal reflux disease)    H/O ETOH abuse    /notes 12/20/2017   High cholesterol    Hypertension    Ischemic cardiomyopathy    a.  08/2019 s/p BSX D232 single lead AICD.   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 30 tablet 11   carvedilol (COREG) 3.125 MG tablet Take 1 tablet (3.125 mg total) by mouth 2 (two) times daily. 60 tablet 11   cetirizine (ZYRTEC) 10 MG tablet Take 10 mg by mouth daily.     eplerenone (INSPRA) 25 MG tablet Take 1 tablet (25 mg total) by mouth daily. 30 tablet 11   ezetimibe (ZETIA) 10 MG tablet Take 1 tablet (10 mg total) by mouth daily. 30 tablet 11   levETIRAcetam (KEPPRA) 500 MG tablet TAKE 1 TABLET BY MOUTH 2 TIMES DAILY 180 tablet 3   sacubitril-valsartan (ENTRESTO) 24-26 MG Take 1 tablet by mouth 2 (two) times daily. 60 tablet 11   No current facility-administered medications for this visit.   There were no vitals taken for this visit.  Wt Readings from Last 3 Encounters:  09/27/21 62 kg (136 lb 9.6 oz)  09/13/21 61.6 kg (135 lb 12.8 oz)  04/27/21 66.9 kg (147 lb 6.4 oz)   PHYSICAL EXAM: General:  NAD. No resp difficulty, chronically-ill appearing. HEENT: Normal Neck: Supple. No JVD. Carotids 2+ bilat; no bruits. No lymphadenopathy or thryomegaly appreciated. Cor: PMI nondisplaced. Regular rate & rhythm. No rubs, gallops or murmurs. Lungs: Clear Abdomen: Soft, nontender, nondistended. No hepatosplenomegaly. No bruits or masses. Good bowel sounds. Extremities: No cyanosis, clubbing, rash, edema Neuro: Alert & oriented x 3, cranial nerves grossly intact. Moves all 4 extremities w/o difficulty. Affect pleasant.  Device interrogation: HL Score 43, fluid up,  4.8 hrs/day of activity, no VT (personally reviewed).  ReDs: 30%  ASSESSMENT & PLAN: 1. Chronic systolic CHF with ICM - Echo 11/30/17 25-30% RV normal.   - Echo 03/2018: EF 40-45%  RV normal.  - Echo 5/20 EF ~ 25%. Has ICD.  - Echo 12/2019 EF 30-35% RV normal. - NYHA II, volume looks ok on exam but HL Score 43, weight stable, ReDs 30%. - He has not been on any of his medications since our last visit. - Continue PRN lasix 20 mg.  - Restart eplerenone 12.5 mg daily.  - Restart carvedilol 3.125 mg bid.  - Restart Entresto 24-26 mg bid. (He did not tolerate 49-51 mg dose due to hypotension).  - Previously on ivabradine, unsure when he last took this. Will  hold off restarting today with HR 87 and restarting beta blocker. - Not a candidate for advanced therapies due to ongoing alcohol abuse. - Labs today, will repeat at follow up.  2. CAD s/p CABG 12/07/2017 - Stable. No s/s angina  - Restart ASA, ? blocker, atorvastatin + zetia.   3. H/o VF arrest in setting of STEMI - Has ICD, followed by EP. - Device interrogation in clinic. HL score 43. Fluid trending up. Activity level 4.8 hr/day No VT   4. Seizures: - Denies any recent recurrence.  - Restart Keppra 500 mg bid.  - Followed by Neurology.    5. ETOH abuse  - Still drinking beer on daily basis.  - Again discussed need for alcohol cessation. He has no plans to quit.    6. H/O Noncompliance/ Poor Health Literacy  - Discharged previously from [facility] as they could not get in contact with him. Now back on board. - Discussed importance of strict compliance w/ meds. - Appreciate [facility]'s assistance.  He only picked up his medications today and has not been on any of his medications since his last visit. Restart meds as above. Will repeat labs at next appt.   Will keep close follow up to get him back on track with his medicines, follow up with APP in 2-3 weeks.  Jacklynn Ganong FNP 9:12 AM

## 2022-01-12 ENCOUNTER — Telehealth (HOSPITAL_COMMUNITY): Payer: Self-pay

## 2022-01-12 NOTE — Telephone Encounter (Signed)
Called patient's phone and was unable to leave a voice message to confirm/remind patient of their appointment at the [facility] on 01/13/22.

## 2022-01-13 ENCOUNTER — Encounter (HOSPITAL_COMMUNITY): Payer: Medicaid Other

## 2022-01-27 ENCOUNTER — Telehealth (HOSPITAL_COMMUNITY): Payer: Self-pay | Admitting: Licensed Clinical Social Worker

## 2022-01-27 NOTE — Telephone Encounter (Signed)
CSW received call from pt- he has new number so updated in chart.  Reports he is staying with his girlfriends aunt at this time.  Informed pt he should call clinic to make follow up appt so he could be seen and we could discuss other issues in person as pt was difficult to understand over the phone.  Will continue to follow and assist as needed  Burna Sis, LCSW Clinical Social Worker [facility] Desk#: [phone] Cell#: [phone]

## 2022-02-06 ENCOUNTER — Telehealth (HOSPITAL_COMMUNITY): Payer: Self-pay | Admitting: Licensed Clinical Social Worker

## 2022-02-06 NOTE — Telephone Encounter (Signed)
CSW received VM that pt is having problems with his food stamps and needing assistance.  Attempted to call pt to discuss- unable to reach- left VM requesting return call  [name], [facility] Desk#: [phone] Cell#: [phone]

## 2022-05-07 IMAGING — CT CT ANGIO CHEST
2 of 7 series · 18 of 46 positions shown · IV contrast (omnipaque)
Comparison: Chest x-ray August 13, 2020. Noncontrast chest CT
November 29, 2017.

CLINICAL DATA: COVID positive patient. Worsening cough and
shortness of breath.

EXAM:
CT ANGIOGRAPHY CHEST WITH CONTRAST
TECHNIQUE: Multidetector CT imaging of the chest was performed using the
standard protocol during bolus administration of intravenous
contrast. Multiplanar CT image reconstructions and MIPs were
obtained to evaluate the vascular anatomy.
CONTRAST:  75mL OMNIPAQUE IOHEXOL 350 MG/ML SOLN

[Series 8: thins (person_name) · axial · 0.70mm/px · z∈[+1162,+1468]mm · 15 of 343 slices shown]
[im 19/343  lung]
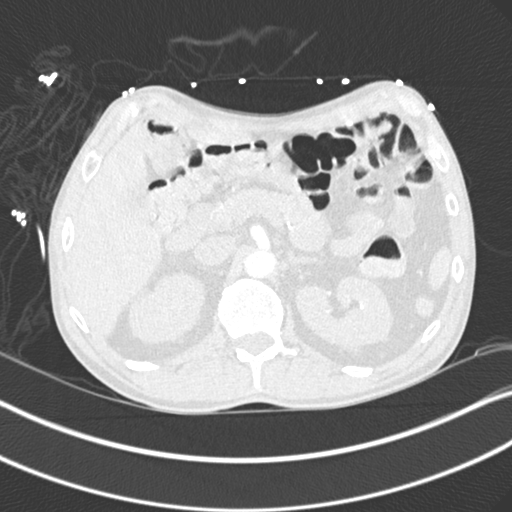
[im 37/343  soft-tissue]
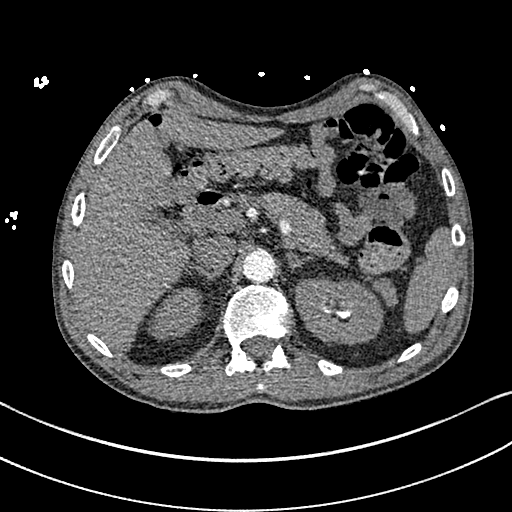
[im 73/343  lung]
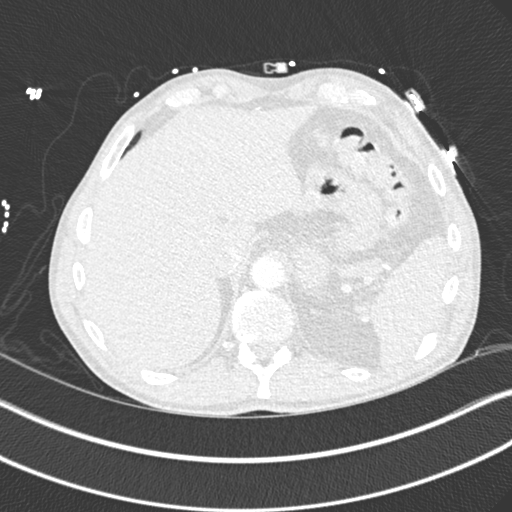
[im 91/343  soft-tissue]
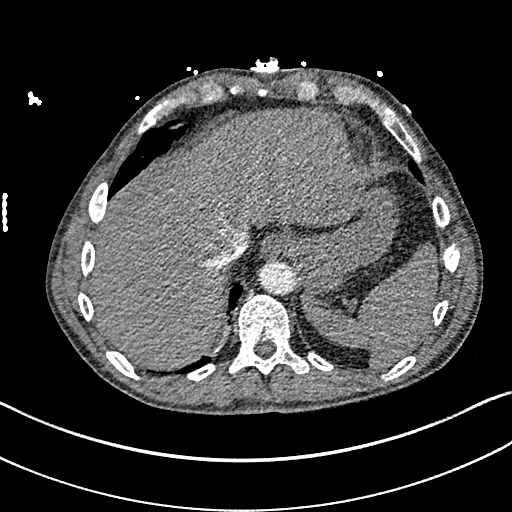
[im 109/343  lung]
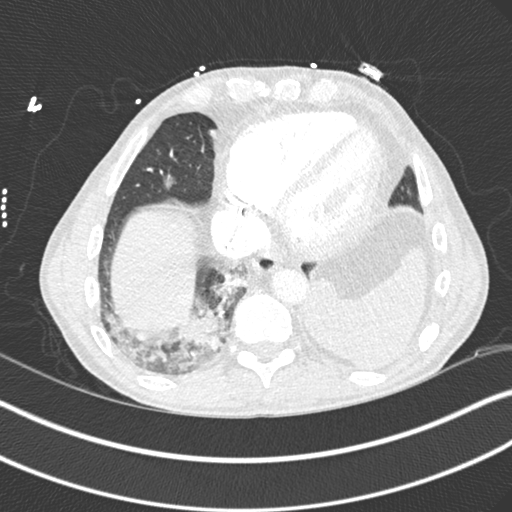
[im 127/343  soft-tissue]
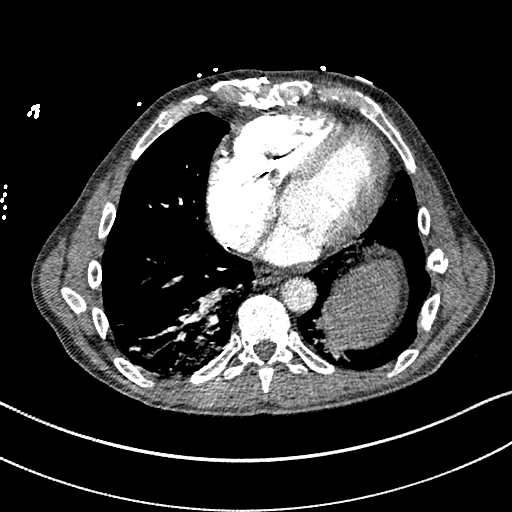
[im 145/343  lung]
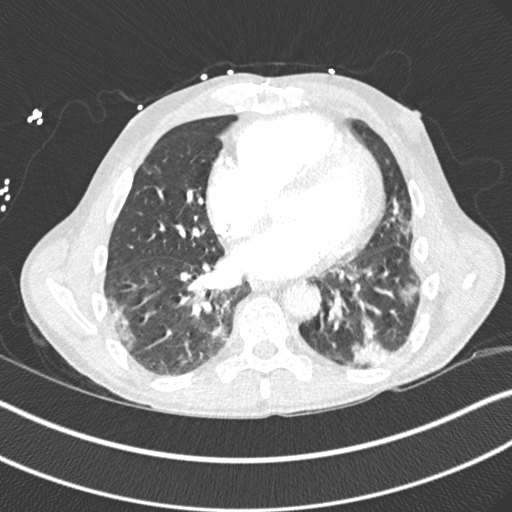
[im 181/343  soft-tissue]
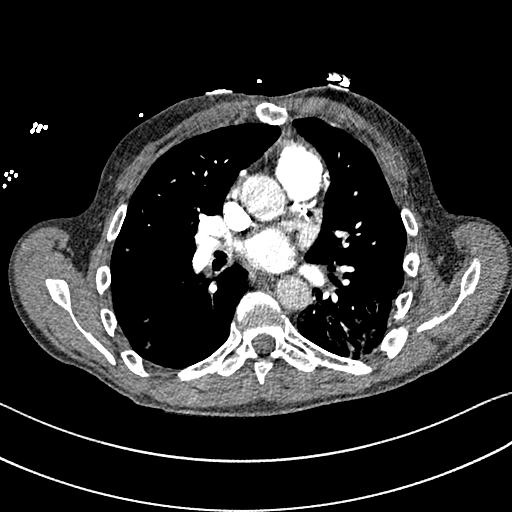
[im 199/343  lung]
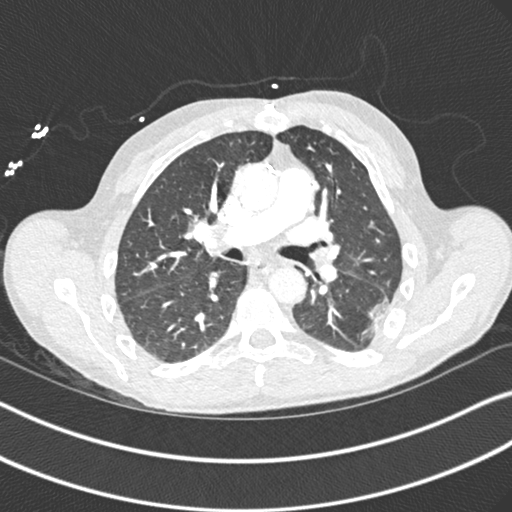
[im 217/343  soft-tissue]
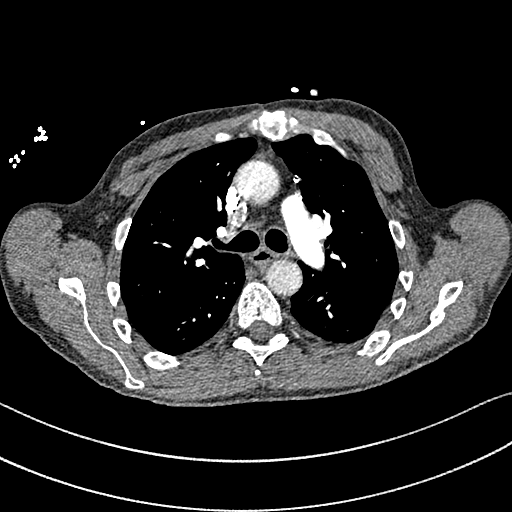
[im 235/343  lung]
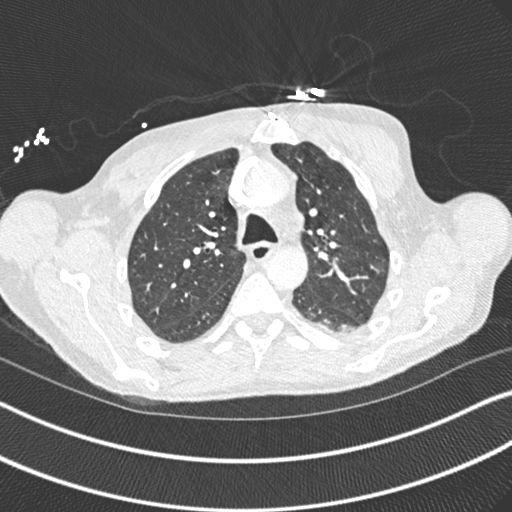
[im 253/343  soft-tissue]
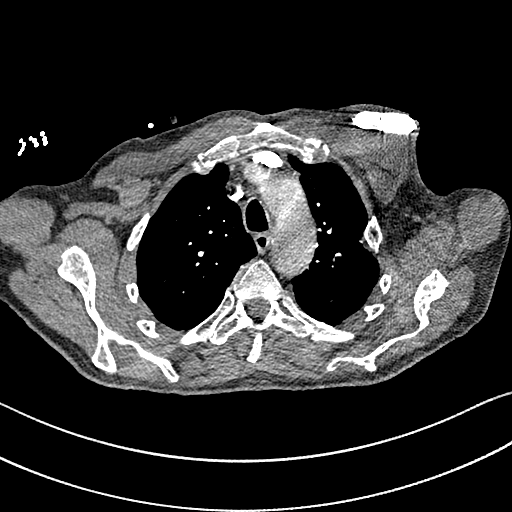
[im 289/343  lung]
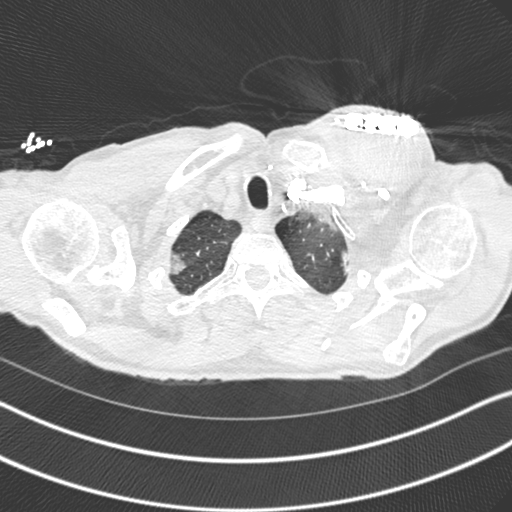
[im 307/343  soft-tissue]
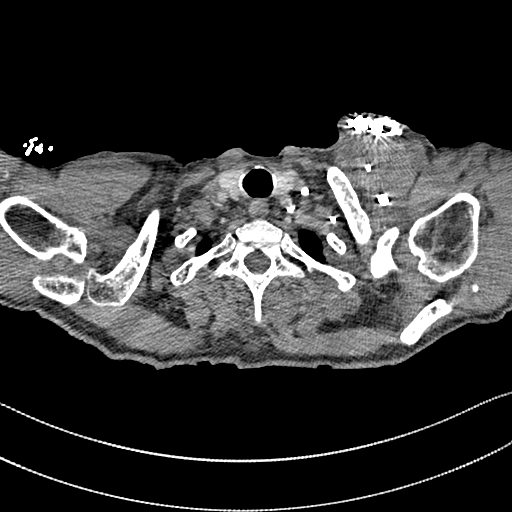
[im 325/343  lung]
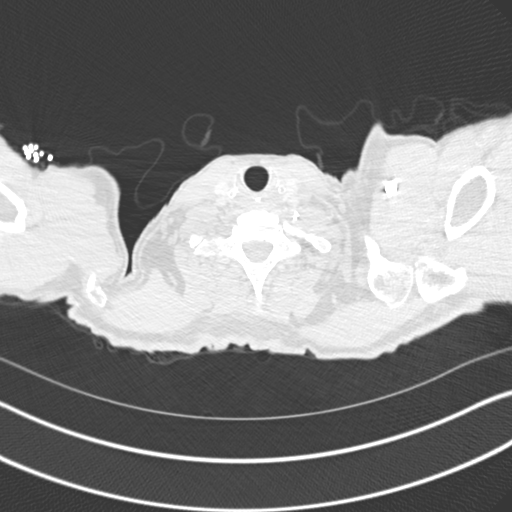

[Series 10: coronal mpr · coronal · 0.72mm/px · 3 of 147 slices shown]
[im 37/147  soft-tissue]
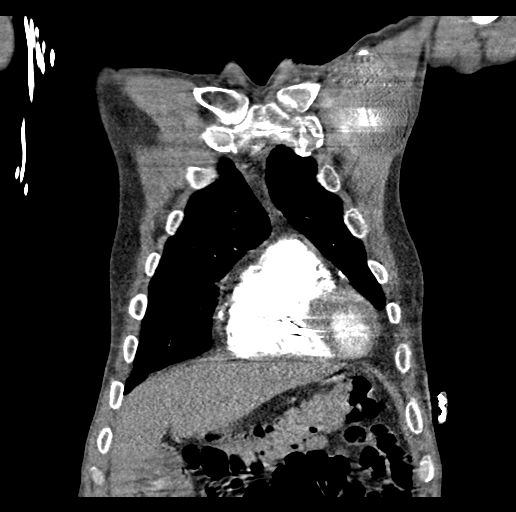
[im 74/147  soft-tissue]
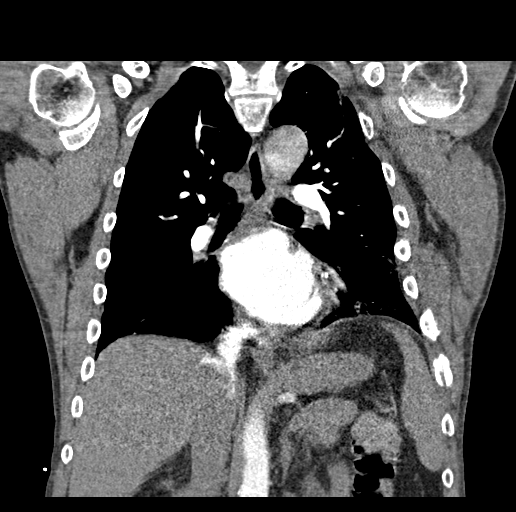
[im 110/147  soft-tissue]
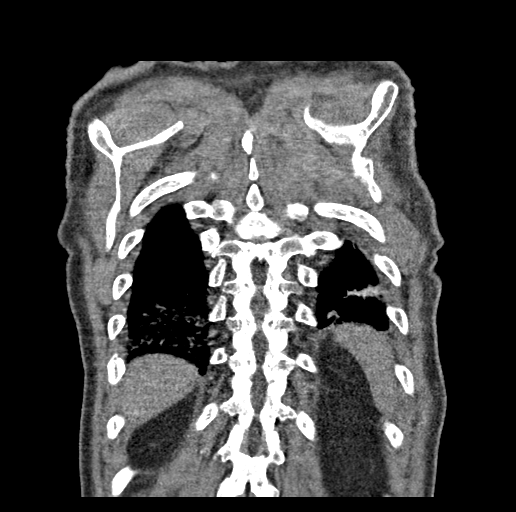

[18 of 46 positions shown; findings below may reference images not displayed]

FINDINGS: Cardiovascular: Three-vessel coronary artery disease identified. The
heart size remains borderline. The thoracic aorta is unremarkable.
No pulmonary emboli.

Mediastinum/Nodes: The thyroid and esophagus are normal. No
effusions. A pacemaker has been placed in the interval. Prominence
posterior to the pacemaker is identified. Bilateral gynecomastia,
relatively mild. No adenopathy.

Lungs/Pleura: Central airways are normal. No pneumothorax. Patchy
peripheral ground-glass opacities involving all lobes but most
prominent the bases is consistent with the patient's reported
history [id number]. More focal consolidation is seen in the right base
on series 9, image 75. No suspicious nodules or masses.

Upper Abdomen: No acute abnormality.

Musculoskeletal: No chest wall abnormality. No acute or significant
osseous findings.

Review of the MIP images confirms the above findings.
IMPRESSION: 1. No pulmonary emboli.
2. Patchy primarily ground-glass opacities involving all lobes but
most prominent in the periphery of the bases is consistent with
UUO2B-1A pneumonia given history. More dense consolidation is seen
in the right base.
3. The left pacemaker appears to be displaced anteriorly by either a
fluid collection which could represent a postoperative seroma given
placement of the implant a year ago. Prominent musculature or an
abscess are not excluded on limited imaging of this location. Streak
artifact limits evaluation in this location. Recommend clinical
correlation.
4. Three-vessel coronary artery disease.

## 2022-05-08 DIAGNOSIS — R404 Transient alteration of awareness: Secondary | ICD-10-CM | POA: Diagnosis not present

## 2022-05-08 DIAGNOSIS — G40909 Epilepsy, unspecified, not intractable, without status epilepticus: Secondary | ICD-10-CM | POA: Diagnosis not present

## 2022-05-08 DIAGNOSIS — R569 Unspecified convulsions: Secondary | ICD-10-CM | POA: Diagnosis not present

## 2022-05-08 DIAGNOSIS — I10 Essential (primary) hypertension: Secondary | ICD-10-CM | POA: Diagnosis not present

## 2022-05-08 DIAGNOSIS — R9431 Abnormal electrocardiogram [ECG] [EKG]: Secondary | ICD-10-CM | POA: Diagnosis not present

## 2022-05-08 IMAGING — US US SOFT TISSUE
1 series · 7 of 7 positions shown · non-contrast
Comparison: CT pulmonary angiogram August 14, 2020

CLINICAL DATA: Evaluate for abscess beneath pacemaker.

EXAM:
ULTRASOUND OF HEAD/NECK SOFT TISSUES
TECHNIQUE: Ultrasound examination of the head and neck soft tissues was
performed in the area of clinical concern.

[Series 1: us chest soft tissue · 7 acquisitions, 7 frames shown]
[im 1/7]
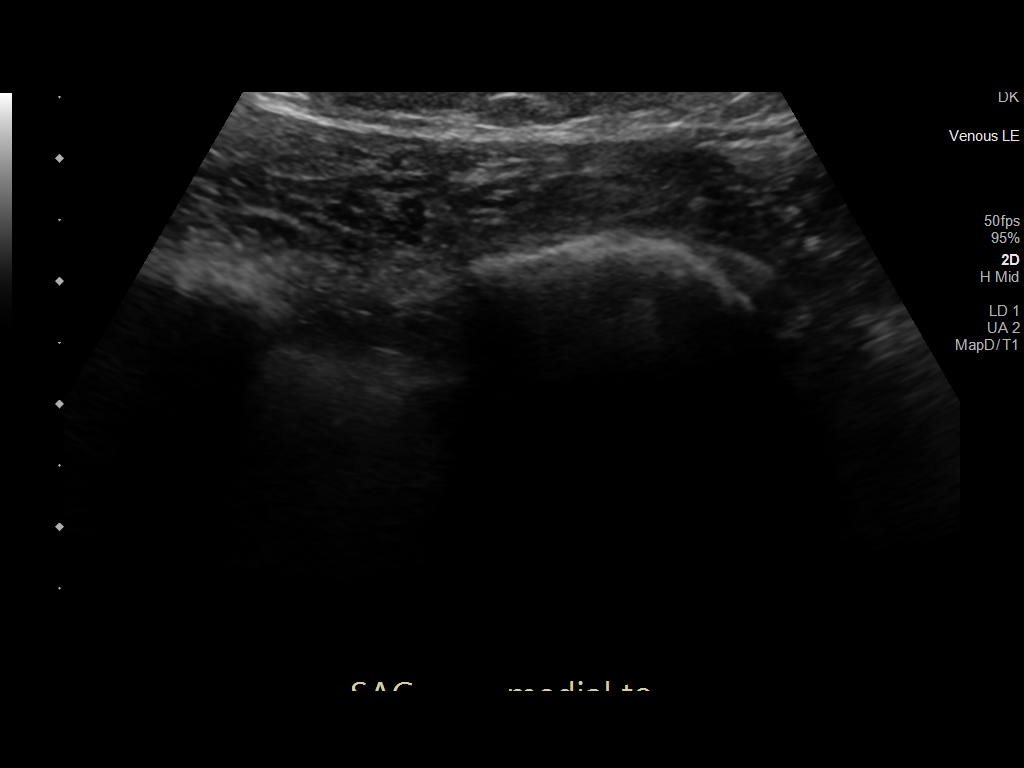
[im 2/7]
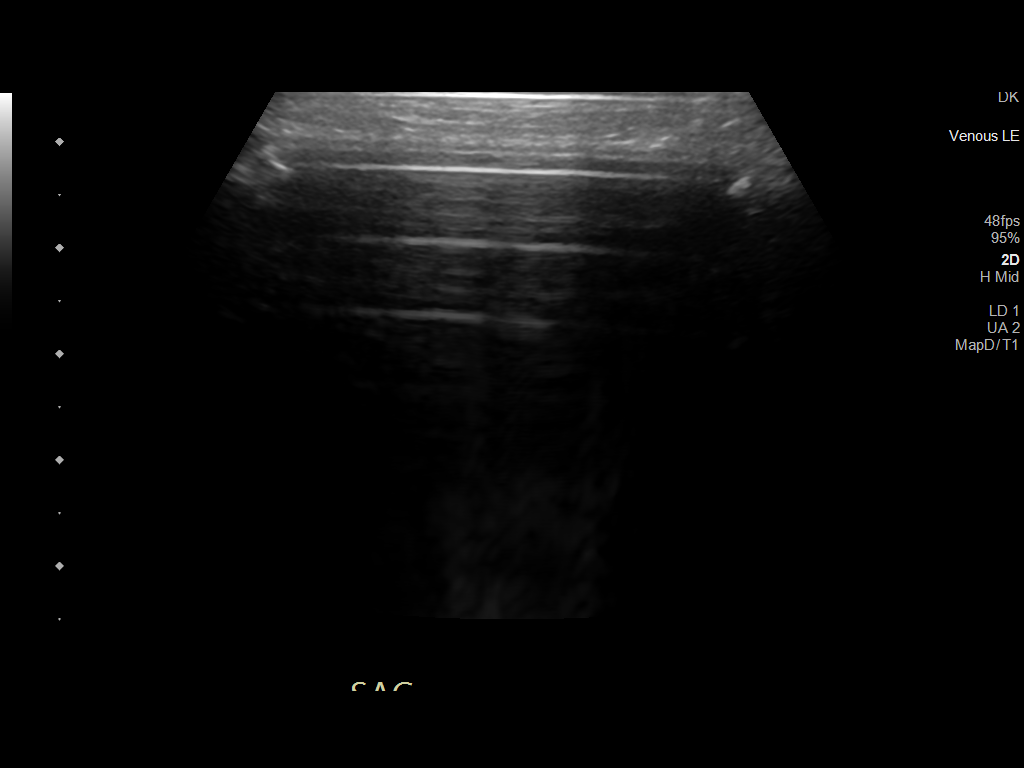
[im 3/7]
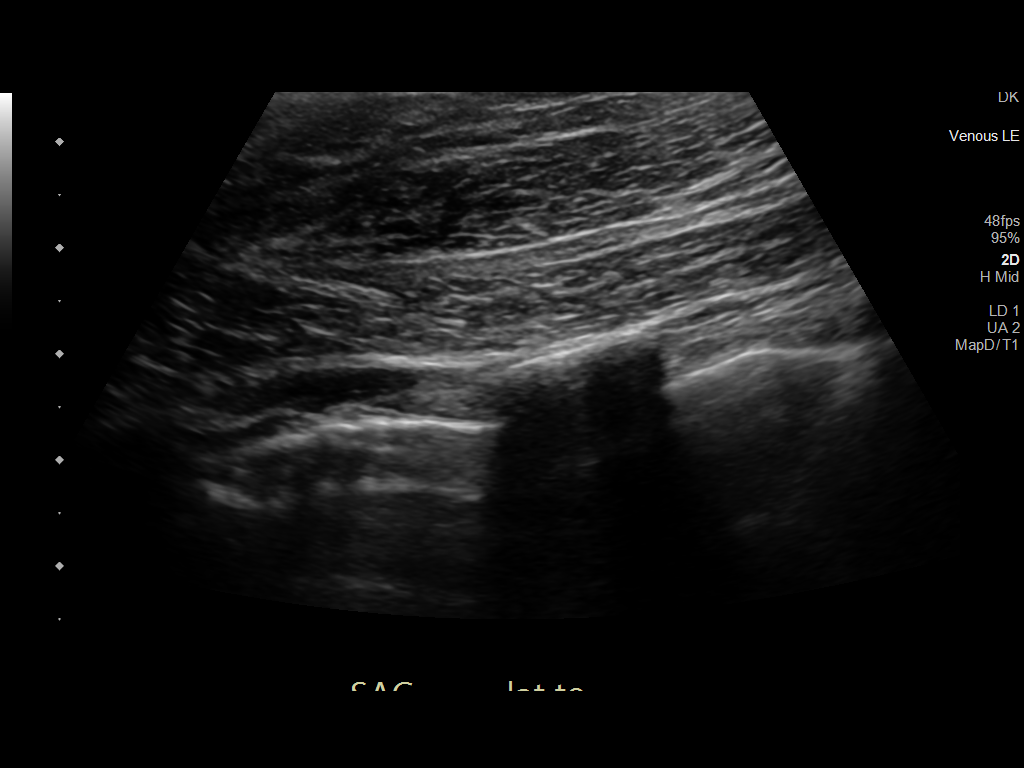
[im 4/7]
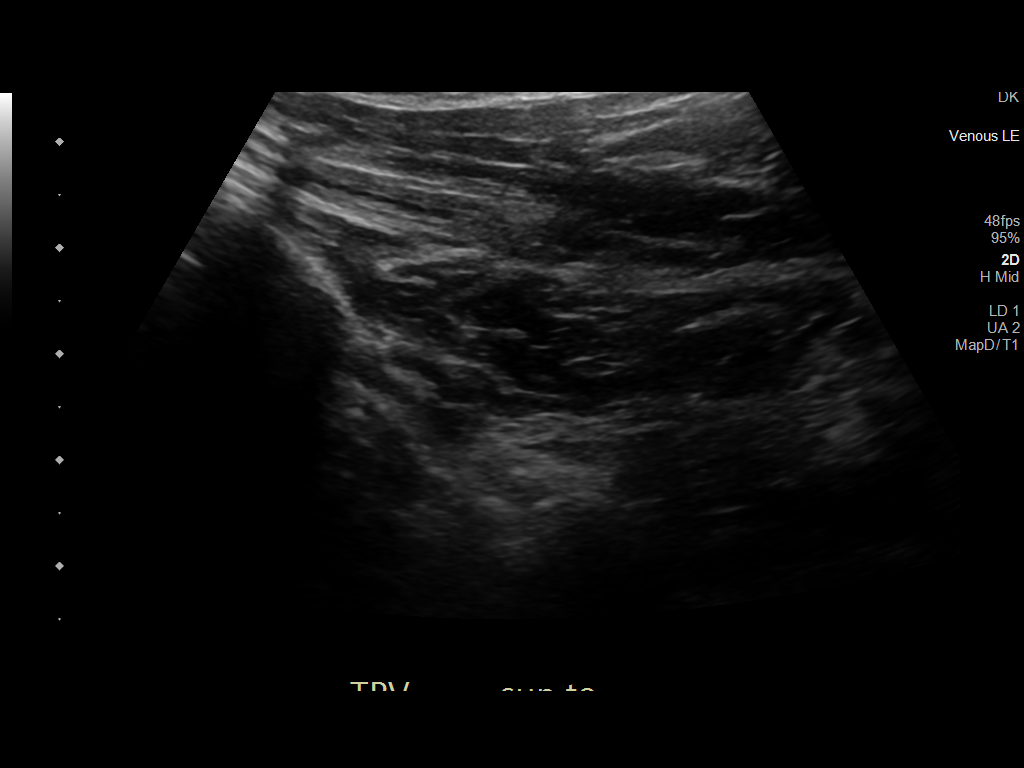
[im 5/7]
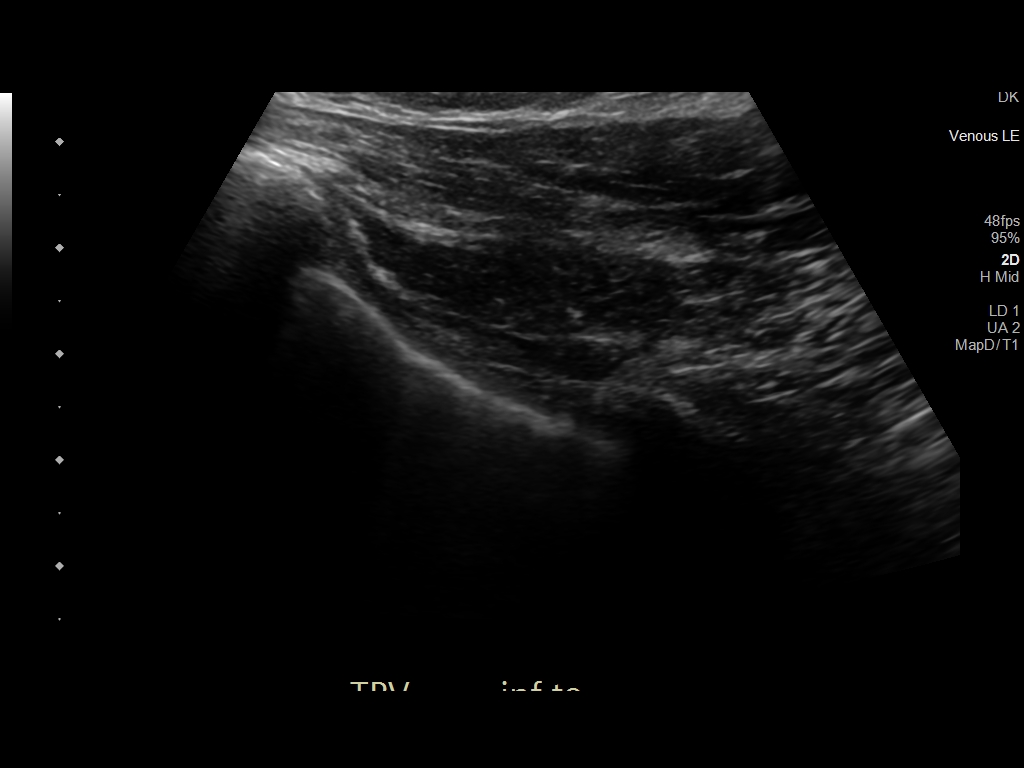
[im 6/7]
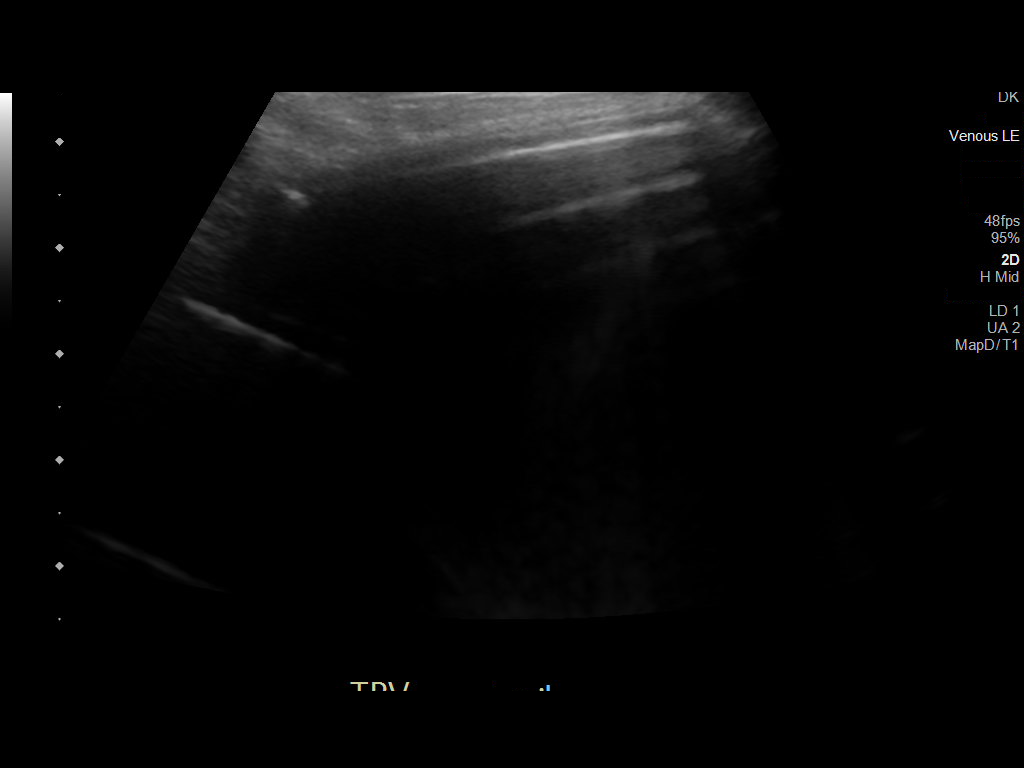
[im 7/7]
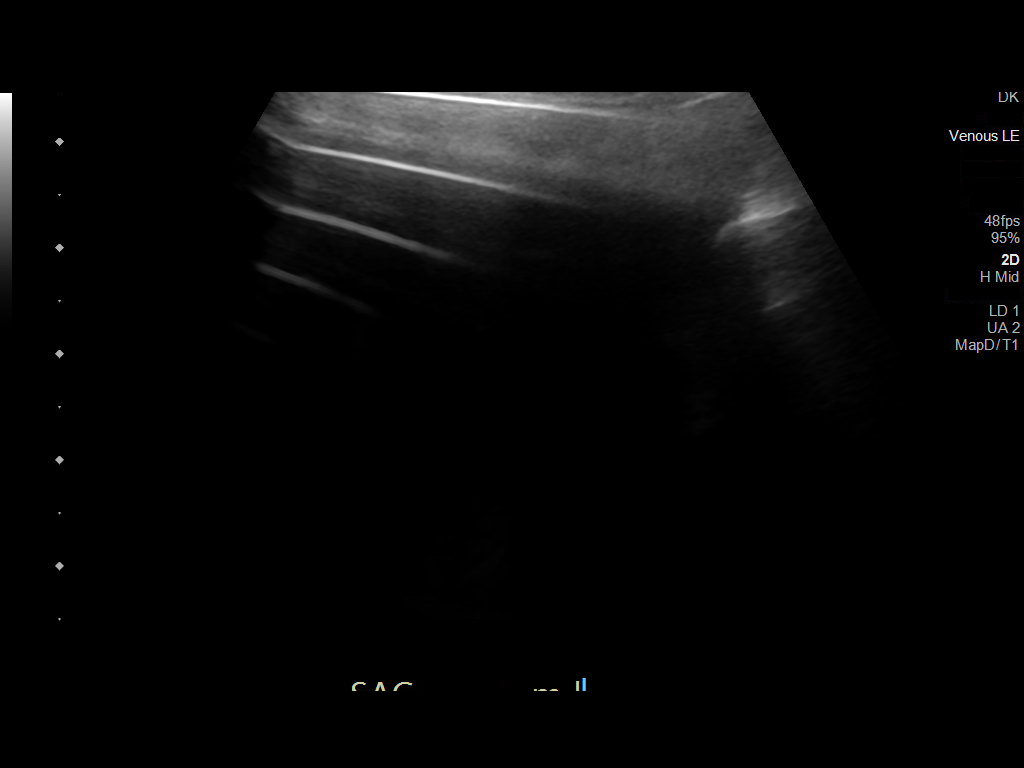

[7 of 7 positions shown; findings below may reference images not displayed]

FINDINGS: Due to the pacemaker, it is difficult to see directly behind the
pacemaker. However, the musculature immediately adjacent to the
pacemaker is normal in appearance.
IMPRESSION: It is difficult to see immediately behind the pacemaker on
ultrasound or CT imaging. However, the musculature and soft tissue
surrounding the pacemaker are completely normal on today's study. I
suspect the prominence on the CT pulmonary angiogram is due to
asymmetric muscular thickening due to the overlying pacemaker and
patient positioning.

## 2022-07-10 ENCOUNTER — Telehealth (HOSPITAL_COMMUNITY): Payer: Self-pay | Admitting: Licensed Clinical Social Worker

## 2022-07-12 ENCOUNTER — Telehealth (HOSPITAL_COMMUNITY): Payer: Self-pay | Admitting: Licensed Clinical Social Worker
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
## 2022-07-12 NOTE — Telephone Encounter (Signed)
CSW called pt friends number to try and get a hold of patient and inform of scheduled appt- friend answered and will get message to pt to call me  Will continue to follow and assist as needed  Burna Sis, LCSW Clinical Social Worker [facility] Desk#: [phone] Cell#: [phone]

## 2022-07-14 ENCOUNTER — Telehealth (HOSPITAL_COMMUNITY): Payer: Self-pay | Admitting: Licensed Clinical Social Worker
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
## 2022-07-19 ENCOUNTER — Telehealth (HOSPITAL_COMMUNITY): Payer: Self-pay | Admitting: Licensed Clinical Social Worker

## 2022-07-19 NOTE — Telephone Encounter (Signed)
Pt called CSW from friends phone.  CSW informed of [employer] benefit case workers number and need for him to call and provide his name, DOB, and new address so it can be changed in the system.  Case worker is Angelica [phone]  Burna Sis, LCSW Clinical Social Worker [facility] Desk#: [phone] Cell#: [phone]

## 2022-07-23 DIAGNOSIS — R569 Unspecified convulsions: Secondary | ICD-10-CM | POA: Diagnosis not present

## 2022-07-23 DIAGNOSIS — R410 Disorientation, unspecified: Secondary | ICD-10-CM | POA: Diagnosis not present

## 2022-07-23 DIAGNOSIS — I10 Essential (primary) hypertension: Secondary | ICD-10-CM | POA: Diagnosis not present

## 2022-07-23 DIAGNOSIS — I499 Cardiac arrhythmia, unspecified: Secondary | ICD-10-CM | POA: Diagnosis not present

## 2022-07-23 DIAGNOSIS — R0902 Hypoxemia: Secondary | ICD-10-CM | POA: Diagnosis not present

## 2022-07-24 DIAGNOSIS — E876 Hypokalemia: Secondary | ICD-10-CM | POA: Diagnosis not present

## 2022-07-24 DIAGNOSIS — F10139 Alcohol abuse with withdrawal, unspecified: Secondary | ICD-10-CM | POA: Diagnosis not present

## 2022-07-24 DIAGNOSIS — E871 Hypo-osmolality and hyponatremia: Secondary | ICD-10-CM | POA: Diagnosis not present

## 2022-07-25 DIAGNOSIS — F10139 Alcohol abuse with withdrawal, unspecified: Secondary | ICD-10-CM | POA: Diagnosis not present

## 2022-07-25 DIAGNOSIS — E871 Hypo-osmolality and hyponatremia: Secondary | ICD-10-CM | POA: Diagnosis not present

## 2022-07-25 DIAGNOSIS — E876 Hypokalemia: Secondary | ICD-10-CM | POA: Diagnosis not present

## 2022-08-14 ENCOUNTER — Telehealth (HOSPITAL_COMMUNITY): Payer: Self-pay | Admitting: Licensed Clinical Social Worker

## 2022-08-14 NOTE — Telephone Encounter (Signed)
CSW attempted to call pt to remind of appt tomorrow.  Unable to reach- pt doesn't have a phone so left message on friends VM requesting return call  Burna Sis, LCSW Clinical Social Worker [facility] Desk#: [phone] Cell#: [phone]

## 2022-08-15 ENCOUNTER — Encounter (HOSPITAL_COMMUNITY): Payer: Medicaid Other

## 2022-09-06 ENCOUNTER — Telehealth (HOSPITAL_COMMUNITY): Payer: Self-pay | Admitting: Licensed Clinical Social Worker

## 2022-09-06 NOTE — Telephone Encounter (Signed)
H&V Care Navigation CSW Progress Note  Clinical Social Worker informed by staff that pt has called in with new stable number and wants to get transport to appt on Friday.  CSW called pt and confirmed he is at same address.  CSW called Medicaid transport and assisted in setting up ride.  Pt will be picked up Friday from home between 8:15-8:45am Will be picked up from appt at 11am Ride ID: [id number]  Patient is participating in a Managed Medicaid Plan:  Yes  SDOH Screenings   Food Insecurity: Food Insecurity Present (07/14/2022)  Housing: High Risk (02/23/2021)  Transportation Needs: Unmet Transportation Needs (09/06/2022)  Depression (PHQ2-9): Low Risk  (07/06/2020)  Financial Resource Strain: High Risk (02/23/2021)  Tobacco Use: Low Risk  (09/27/2021)    Burna Sis, LCSW Clinical Social Worker [facility] Desk#: [phone] Cell#: [phone]

## 2022-09-07 ENCOUNTER — Telehealth (HOSPITAL_COMMUNITY): Payer: Self-pay

## 2022-09-07 NOTE — Telephone Encounter (Signed)
Called and left patient a detailed message to confirm/remind patient of their appointment at the [facility] on 09/08/22.

## 2022-09-08 ENCOUNTER — Ambulatory Visit (HOSPITAL_COMMUNITY)
Admission: RE | Admit: 2022-09-08 | Discharge: 2022-09-08 | Disposition: A | Discharge status: Home / Self Care | Payer: Medicaid Other | Source: Ambulatory Visit | Attending: Family Medicine | Admitting: Family Medicine

## 2022-09-08 ENCOUNTER — Encounter (HOSPITAL_COMMUNITY): Payer: Self-pay

## 2022-09-08 VITALS — BP 98/62 | HR 83 | Wt 152.0 lb

## 2022-09-08 DIAGNOSIS — I255 Ischemic cardiomyopathy: Secondary | ICD-10-CM | POA: Insufficient documentation

## 2022-09-08 DIAGNOSIS — R42 Dizziness and giddiness: Secondary | ICD-10-CM | POA: Diagnosis not present

## 2022-09-08 DIAGNOSIS — I2510 Atherosclerotic heart disease of native coronary artery without angina pectoris: Secondary | ICD-10-CM | POA: Diagnosis not present

## 2022-09-08 DIAGNOSIS — Z91148 Patient's other noncompliance with medication regimen for other reason: Secondary | ICD-10-CM | POA: Insufficient documentation

## 2022-09-08 DIAGNOSIS — R0602 Shortness of breath: Secondary | ICD-10-CM | POA: Diagnosis present

## 2022-09-08 DIAGNOSIS — R569 Unspecified convulsions: Secondary | ICD-10-CM

## 2022-09-08 DIAGNOSIS — Z91199 Patient's noncompliance with other medical treatment and regimen due to unspecified reason: Secondary | ICD-10-CM

## 2022-09-08 DIAGNOSIS — F1010 Alcohol abuse, uncomplicated: Secondary | ICD-10-CM | POA: Diagnosis not present

## 2022-09-08 DIAGNOSIS — Z79899 Other long term (current) drug therapy: Secondary | ICD-10-CM | POA: Insufficient documentation

## 2022-09-08 DIAGNOSIS — I5023 Acute on chronic systolic (congestive) heart failure: Secondary | ICD-10-CM | POA: Insufficient documentation

## 2022-09-08 DIAGNOSIS — R0789 Other chest pain: Secondary | ICD-10-CM | POA: Diagnosis not present

## 2022-09-08 DIAGNOSIS — Z951 Presence of aortocoronary bypass graft: Secondary | ICD-10-CM | POA: Diagnosis not present

## 2022-09-08 DIAGNOSIS — I469 Cardiac arrest, cause unspecified: Secondary | ICD-10-CM

## 2022-09-08 DIAGNOSIS — Z7982 Long term (current) use of aspirin: Secondary | ICD-10-CM | POA: Insufficient documentation

## 2022-09-08 DIAGNOSIS — Z8674 Personal history of sudden cardiac arrest: Secondary | ICD-10-CM | POA: Insufficient documentation

## 2022-09-08 DIAGNOSIS — I110 Hypertensive heart disease with heart failure: Secondary | ICD-10-CM | POA: Diagnosis not present

## 2022-09-08 DIAGNOSIS — I252 Old myocardial infarction: Secondary | ICD-10-CM | POA: Diagnosis not present

## 2022-09-08 DIAGNOSIS — Z8616 Personal history of COVID-19: Secondary | ICD-10-CM | POA: Diagnosis not present

## 2022-09-08 DIAGNOSIS — Z556 Problems related to health literacy: Secondary | ICD-10-CM | POA: Diagnosis not present

## 2022-09-08 LAB — CBC
HCT: 42.8 % (ref 39.0–52.0)
Hemoglobin: 13.8 g/dL (ref 13.0–17.0)
MCH: 32.2 pg (ref 26.0–34.0)
MCHC: 32.2 g/dL (ref 30.0–36.0)
MCV: 100 fL (ref 80.0–100.0)
Platelets: 191 10*3/uL (ref 150–400)
RBC: 4.28 MIL/uL (ref 4.22–5.81)
RDW: 14.5 % (ref 11.5–15.5)
WBC: 5.2 10*3/uL (ref 4.0–10.5)
nRBC: 0 % (ref 0.0–0.2)

## 2022-09-08 LAB — COMPREHENSIVE METABOLIC PANEL
ALT: 21 U/L (ref 0–44)
AST: 37 U/L (ref 15–41)
Albumin: 3.2 g/dL — ABNORMAL LOW (ref 3.5–5.0)
Alkaline Phosphatase: 69 U/L (ref 38–126)
Anion gap: 8 (ref 5–15)
BUN: 14 mg/dL (ref 6–20)
CO2: 22 mmol/L (ref 22–32)
Calcium: 8.4 mg/dL — ABNORMAL LOW (ref 8.9–10.3)
Chloride: 105 mmol/L (ref 98–111)
Creatinine, Ser: 1.14 mg/dL (ref 0.61–1.24)
GFR, Estimated: >60 mL/min (ref >60)
Glucose, Bld: 75 mg/dL (ref 70–99)
Potassium: 3.5 mmol/L (ref 3.5–5.1)
Sodium: 135 mmol/L (ref 135–145)
Total Bilirubin: 1.1 mg/dL (ref 0.3–1.2)
Total Protein: 6 g/dL — ABNORMAL LOW (ref 6.5–8.1)

## 2022-09-08 LAB — BRAIN NATRIURETIC PEPTIDE: B Natriuretic Peptide: 1094.7 pg/mL — ABNORMAL HIGH (ref 0.0–100.0)

## 2022-09-08 MED ORDER — POTASSIUM CHLORIDE CRYS ER 20 MEQ PO TBCR: 20.0000 meq | EXTENDED_RELEASE_TABLET | Freq: Every day | ORAL | 6 refills | Status: DC

## 2022-09-08 MED ORDER — FUROSEMIDE 10 MG/ML IJ SOLN
80.0000 mg | Freq: Once | INTRAMUSCULAR | Status: AC
  Administered 2022-09-08: 80 mg via INTRAVENOUS

## 2022-09-08 MED ORDER — POTASSIUM CHLORIDE CRYS ER 20 MEQ PO TBCR
40.0000 meq | EXTENDED_RELEASE_TABLET | Freq: Once | ORAL | Status: AC
  Administered 2022-09-08: 40 meq via ORAL

## 2022-09-08 MED ORDER — METOLAZONE 5 MG PO TABS
5.0000 mg | ORAL_TABLET | Freq: Once | ORAL | Status: AC
  Administered 2022-09-08: 5 mg via ORAL

## 2022-09-08 MED ORDER — FUROSEMIDE 40 MG PO TABS: 40.0000 mg | ORAL_TABLET | Freq: Every day | ORAL | 1 refills | Status: DC

## 2022-09-08 NOTE — Patient Instructions (Addendum)
Thank you for coming in today  Labs were done today, if any labs are abnormal the clinic will call you No news is good news  START Lasix 40 mg daily START Potassium 20 meq daily   Your physician recommends that you schedule a follow-up appointment in:  Follow up on Monday 09/11/2022 @ 1130 am  You were given Lasix IV, metolazone and potassium in the clinic    Do the following things EVERYDAY: Weigh yourself in the morning before breakfast. Write it down and keep it in a log. Take your medicines as prescribed Eat low salt foods--Limit salt (sodium) to 2000 mg per day.  Stay as active as you can everyday Limit all fluids for the day to less than 2 liters  At the [facility], you and your health needs are our priority. As part of our continuing mission to provide you with exceptional heart care, we have created designated Provider Care Teams. These Care Teams include your primary Cardiologist (physician) and Advanced Practice Providers (APPs- Physician Assistants and Nurse Practitioners) who all work together to provide you with the care you need, when you need it.   You may see any of the following providers on your designated Care Team at your next follow up: Dr Arvilla Meres Dr Marca Ancona Dr. Marcos Eke, NP Robbie Lis, [place] [facility] [place], [place] Brynda Peon, NP Karle Plumber, PharmD   Please be sure to bring in all your medications bottles to every appointment.   If you have any questions or concerns before your next appointment please send [place] a message through [place] or call our office at [phone].    TO LEAVE A MESSAGE FOR THE NURSE SELECT OPTION 2, PLEASE LEAVE A MESSAGE INCLUDING: YOUR NAME DATE OF BIRTH CALL BACK NUMBER REASON FOR CALL**this is important as we prioritize the call backs  YOU WILL RECEIVE A CALL BACK THE SAME DAY AS LONG AS YOU CALL BEFORE 4:00 PM

## 2022-09-08 NOTE — Progress Notes (Addendum)
H&V Care Navigation CSW Progress Note  Clinical Social Worker met with pt in clinic to discuss lost food stamps.  Attempted to help pt get address changed and order new card a few months ago but pt has not had a way to contact him since that time.  Now in clinic CSW attempted to call EBT but address is incorrect in their chart- CSW called pt case worker with [employer] multiple times without success- left VM.  Will continue to attempt contact while pt is here.  CSW also helped arrange ride for appt on Monday  Pick up from home 10:15-10:45am Pick up from appt 12:30pm Ride ID: [id number]  Patient is participating in a Managed Medicaid Plan:  Yes  [employer]: Food Insecurity Present (09/08/2022)  Housing: High Risk (02/23/2021)  Transportation Needs: Unmet Transportation Needs (09/06/2022)  Depression (PHQ2-9): Low Risk  (07/06/2020)  Financial Resource Strain: High Risk (02/23/2021)  Tobacco Use: Low Risk  (09/08/2022)     [name], [facility] Desk#: [phone] Cell#: [phone]

## 2022-09-08 NOTE — Progress Notes (Signed)
ReDS Vest / Clip - 09/08/22 0900       ReDS Vest / Clip   Station Marker C    Ruler Value 27    ReDS Value Range High volume overload    ReDS Actual Value 48
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
## 2022-09-11 ENCOUNTER — Encounter (HOSPITAL_COMMUNITY): Payer: Self-pay

## 2022-09-11 ENCOUNTER — Telehealth: Payer: Self-pay | Admitting: Licensed Clinical Social Worker

## 2022-09-11 ENCOUNTER — Ambulatory Visit (HOSPITAL_COMMUNITY)
Admission: RE | Admit: 2022-09-11 | Discharge: 2022-09-11 | Disposition: A | Discharge status: Home / Self Care | Payer: Medicaid Other | Source: Ambulatory Visit | Attending: Physician Assistant | Admitting: Physician Assistant

## 2022-09-11 VITALS — BP 90/60 | HR 89 | Wt 143.6 lb

## 2022-09-11 DIAGNOSIS — Z8674 Personal history of sudden cardiac arrest: Secondary | ICD-10-CM | POA: Diagnosis not present

## 2022-09-11 DIAGNOSIS — Z556 Problems related to health literacy: Secondary | ICD-10-CM | POA: Insufficient documentation

## 2022-09-11 DIAGNOSIS — Z951 Presence of aortocoronary bypass graft: Secondary | ICD-10-CM | POA: Diagnosis not present

## 2022-09-11 DIAGNOSIS — Z95810 Presence of automatic (implantable) cardiac defibrillator: Secondary | ICD-10-CM | POA: Diagnosis not present

## 2022-09-11 DIAGNOSIS — I5023 Acute on chronic systolic (congestive) heart failure: Secondary | ICD-10-CM

## 2022-09-11 DIAGNOSIS — Z79899 Other long term (current) drug therapy: Secondary | ICD-10-CM | POA: Diagnosis not present

## 2022-09-11 DIAGNOSIS — R569 Unspecified convulsions: Secondary | ICD-10-CM | POA: Insufficient documentation

## 2022-09-11 DIAGNOSIS — I252 Old myocardial infarction: Secondary | ICD-10-CM | POA: Insufficient documentation

## 2022-09-11 DIAGNOSIS — I2510 Atherosclerotic heart disease of native coronary artery without angina pectoris: Secondary | ICD-10-CM | POA: Insufficient documentation

## 2022-09-11 DIAGNOSIS — Z91148 Patient's other noncompliance with medication regimen for other reason: Secondary | ICD-10-CM | POA: Insufficient documentation

## 2022-09-11 DIAGNOSIS — F1010 Alcohol abuse, uncomplicated: Secondary | ICD-10-CM | POA: Insufficient documentation

## 2022-09-11 DIAGNOSIS — I255 Ischemic cardiomyopathy: Secondary | ICD-10-CM | POA: Diagnosis not present

## 2022-09-11 LAB — BASIC METABOLIC PANEL
Anion gap: 9 (ref 5–15)
BUN: 10 mg/dL (ref 6–20)
CO2: 26 mmol/L (ref 22–32)
Calcium: 8.8 mg/dL — ABNORMAL LOW (ref 8.9–10.3)
Chloride: 105 mmol/L (ref 98–111)
Creatinine, Ser: 1.14 mg/dL (ref 0.61–1.24)
GFR, Estimated: >60 mL/min (ref >60)
Glucose, Bld: 73 mg/dL (ref 70–99)
Potassium: 3.2 mmol/L — ABNORMAL LOW (ref 3.5–5.1)
Sodium: 140 mmol/L (ref 135–145)

## 2022-09-11 LAB — BRAIN NATRIURETIC PEPTIDE: B Natriuretic Peptide: 1326.5 pg/mL — ABNORMAL HIGH (ref 0.0–100.0)

## 2022-09-11 MED ORDER — FUROSEMIDE 20 MG PO TABS: 20.0000 mg | ORAL_TABLET | Freq: Every day | ORAL | 3 refills | Status: DC

## 2022-09-11 MED ORDER — POTASSIUM CHLORIDE CRYS ER 20 MEQ PO TBCR: 20.0000 meq | EXTENDED_RELEASE_TABLET | Freq: Every day | ORAL | 3 refills | Status: DC

## 2022-09-11 NOTE — Progress Notes (Signed)
ADVANCED HF CLINIC NOTE  PCP: Geryl Rankins, NP HF Cardiologist: Dr Haroldine Laws   Reason for Visit: F/u for chronic systolic heart failure   HPI: [name] is a 61 y.o. male with h/o ETOH abuse, VF Arrest 2018, CAD s/p CABG 12/07/17, and ICM with EF 25-30%.   Admitted 12/18 with VF arrest while chopping wood. Received CPR in the field. R/LHC with severe 3vD as below and cardiogenic shock requiring IABP and milrinone. Required milrinone with continued shock. Pt improved and IABP removed 12/10, but pt had recurrent VF arrest so IABP replaced. Stabilized and underwent CABG 12/07/17. Tolerated gradual wean of meds and extubation with no further events.  Echo 04/2019  LVEF at 25%.    9/20 implantation of a [employer] single chamber ICD.   He had routine clinic f/u on 10/23/19 and while in exam room had a witnessed seizure  with loss of pulse. CPR started with quick ROSC. He did not require shock. Taken to the ED and had another seizure. Neurology consulted. Started on Keppra. ICD interrogated. No arrhythmias noted. Admitted to [facility] on 12/09/19 with recurrent seizures in setting of noncompliance with his Keppra.    Admitted 8/21 with COVID PNA.   Follow up 5/22 he was having facial and arm tingling, no other neuro symptoms. Ethanol level 145. Carvedilol increased and carotid u/s ordered showing bilateral ICA 1-39%.  Follow up 9/22, he was off all meds. Resumed meds and re-enrolled with [facility].  Follow up 10/22, he remained off all meds, NYHA II. Meds restarted, but lost to follow up.   Seen for f/u on 09/15. Off all meds except Keppra. He was volume overloaded. ReDS 48%. Drinking beer every day. Given 80 mg lasix IV in clinic and 5 mg metolazone. He was given an Rx for lasix 40 daily and 20 mEq KCL.   He is here today for close f/u. Weight down 8 lb. Did not pick up new prescriptions. Says he is taking Keppra and Coreg 6.25 mg BID. Dyspnea improved  with diuresis. Orthopnea, PND and lower extremity edema improved.   Reports having 1-2 drinks a day, had a "59' yesterday. Renting a room from a friend. Eating meatloaf, mac and cheese, and spaghetti.    ROS: All systems negative except as listed in HPI, PMH and Problem List.  SH:  Social History   Socioeconomic History   Marital status: Divorced    Spouse name: Not on file   Number of children: Not on file   Years of education: Not on file   Highest education level: Not on file  Occupational History   Not on file  Tobacco Use   Smoking status: Never   Smokeless tobacco: Never  Vaping Use   Vaping Use: Never used  Substance and Sexual Activity   Alcohol use: Yes    Alcohol/week: 14.0 standard drinks of alcohol    Types: 14 Cans of beer per week    Comment: patient endorses at least 1-2 beers daily (04/2020)   Drug use: No   Sexual activity: Not Currently  Other Topics Concern   Not on file  Social History Narrative   Lives at home with his brother who is crippled. He takes care of his brother.    Right handed    Social Determinants of Health   Financial Resource Strain: High Risk (02/23/2021)   Overall Financial Resource Strain (CARDIA)    Difficulty of Paying  Living Expenses: Hard  Food Insecurity: Food Insecurity Present (09/08/2022)   Hunger Vital Sign    Worried About Running Out of Food in the Last Year: Often true    Ran Out of Food in the Last Year: Often true  Transportation Needs: Unmet Transportation Needs (09/06/2022)   PRAPARE - Hydrologist (Medical): Yes    Lack of Transportation (Non-Medical): Yes  Physical Activity: Not on file  Stress: Not on file  Social Connections: Not on file  Intimate Partner Violence: Not on file   FH:  Family History  Problem Relation Age of Onset   Heart attack Other    Diabetes Neg Hx    Past Medical History:  Diagnosis Date   AICD (automatic cardioverter/defibrillator) present    Alcohol  abuse 05/18/2020   Arthritis    "hands; maybe my toes" (12/20/2017)   CAD (coronary artery disease)    Cardiac arrest ([place]) 11/29/2017   hx VF arrest/notes 12/20/2017   CHF (congestive heart failure) ([place])    Chronic systolic heart failure ([place]) 08/25/2019   Coronary artery disease    Coronary artery disease involving native coronary artery of native heart without angina pectoris 12/07/2017   Essential hypertension 05/18/2020   GERD (gastroesophageal reflux disease)    H/O ETOH abuse    /notes 12/20/2017   High cholesterol    Hypertension    Ischemic cardiomyopathy    a. 08/2019 s/p BSX D232 single lead AICD.   Mixed hyperlipidemia 05/18/2020   Seizure ([place]) 10/21/2019   Seizures ([place]) ~ 2016; ~ 2017   "I've had a couple; I was at work" (12/20/2017)   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, 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 ([place]) 12/30/2019   Syncope and collapse    "last few days" (12/20/2017)   Current Outpatient Medications  Medication Sig Dispense Refill   levETIRAcetam (KEPPRA) 500 MG tablet TAKE 1 TABLET BY MOUTH 2 TIMES DAILY 180 tablet 3   furosemide (LASIX) 40 MG tablet Take 1 tablet (40 mg total) by mouth daily. 90 tablet 1   potassium chloride SA (KLOR-CON M) 20 MEQ tablet Take 1 tablet (20 mEq total) by mouth daily. 30 tablet 6   No current facility-administered medications for this encounter.   BP 90/60   Pulse 89   Wt 65.1 kg (143 lb 9.6 oz)   SpO2 98%   BMI 20.60 kg/m   Wt Readings from Last 3 Encounters:  09/11/22 65.1 kg (143 lb 9.6 oz)  09/08/22 68.9 kg (152 lb)  09/27/21 62 kg (136 lb 9.6 oz)   PHYSICAL EXAM: General:  Thin, chronically ill appearing. Easily distracted. HEENT: normal Neck: supple. JVP 10-12. Carotids 2+ bilat; no bruits.  Cor: PMI nondisplaced. Regular rate & rhythm. No rubs, gallops or murmurs. Lungs: clear Abdomen: soft, nontender, nondistended.  Extremities: no cyanosis, clubbing, rash, trace  edema Neuro: alert & orientedx3, cranial nerves grossly intact. moves all 4 extremities w/o difficulty. Affect pleasant   Device interrogation: HL index 41, activity level 2.4 hrs a day, no device discharges  REDS: 38%  ASSESSMENT & PLAN: 1. Acute on Chronic systolic CHF with ICM - Echo 11/30/17 25-30% RV normal.   - Echo 03/2018: EF 40-45%  RV normal.  - Echo 5/20 EF ~ 25%. Has ICD.  - Echo 12/2019 EF 30-35% RV normal. - He has been off all GDMT presumably since before our last visit 09/2021. - Improved, NYHA II with diuresis in clinic last  week. Never picked up po diuretic. Volume still up by exam and device. - Start lasix 20 mg daily and 20 mEq KCL daily - No plans to quit ETOH or cut back sodium intake - No BP room to add back GDMT.  - Stop coreg with acute exacerbation - No spiro with history of gynecomastia - BP too soft for ARB/ARNi - Not a candidate for advanced therapies due to noncompliance and ongoing alcohol abuse. - Labs today.  - Follow up on Monday, may need admission.  2. CAD  - s/p CABG 12/07/2017 - Stable. No s/s angina.  - Restart ASA, atorvastatin + zetia next visit.  3. H/o VF arrest in setting of STEMI - Has ICD, followed by EP. - Device interrogation in clinic. No shocks.  4. Seizures - Denies any recent recurrence.  - Continue Keppra 500 mg bid. Has only been taking once a day. - Followed by Neurology.    5. ETOH abuse  - Still drinking beer on daily basis.  - Again discussed need for alcohol cessation.  - He has no plans to quit.    6. Medication Noncompliance/ Poor Health Literacy  - Discharged previously from [facility] as they could not get in contact with him x 2. - Discussed importance of strict compliance w/ meds. - HFSW on board.    Follow up: 2 weeks with APP  Lucina Betty N PA-C 11:49 AM

## 2022-09-11 NOTE — Progress Notes (Signed)
ReDS Vest / Clip - 09/11/22 1200       ReDS Vest / Clip   Station Marker C    Ruler Value 25.5    ReDS Value Range Moderate volume overload    ReDS Actual Value 38

## 2022-09-11 NOTE — Patient Instructions (Signed)
START Furosemide 20 mg Daily  START Potassium 20 meq Daily  Labs done today, we will call you for abnormal results  Your physician recommends that you schedule a follow-up appointment in: 2-3 weeks  If you have any questions or concerns before your next appointment please send [place] a message through [address] or call our office at [phone].    TO LEAVE A MESSAGE FOR THE NURSE SELECT OPTION 2, PLEASE LEAVE A MESSAGE INCLUDING: YOUR NAME DATE OF BIRTH CALL BACK NUMBER REASON FOR CALL**this is important as we prioritize the call backs  YOU WILL RECEIVE A CALL BACK THE SAME DAY AS LONG AS YOU CALL BEFORE 4:00 PM  At the [facility], you and your health needs are our priority. As part of our continuing mission to provide you with exceptional heart care, we have created designated Provider Care Teams. These Care Teams include your primary Cardiologist (physician) and Advanced Practice Providers (APPs- Physician Assistants and Nurse Practitioners) who all work together to provide you with the care you need, when you need it.   You may see any of the following providers on your designated Care Team at your next follow up: Dr Glori Bickers Dr Loralie Champagne Dr. Roxana Hires, NP Lyda Jester, [place] [facility], [place] Forestine Na, NP Audry Riles, PharmD   Please be sure to bring in all your medications bottles to every appointment.

## 2022-09-11 NOTE — Telephone Encounter (Signed)
H&V Care Navigation CSW Progress Note  Clinical Social Worker contacted patient by phone to remind him of ride scheduled this morning. This Probation officer confirmed pick up with [facility] Medicaid at [phone], [place] ID# [id number]. Pt will be picked up between 10:15am and 10:45am, estimated by [facility] Medicaid to come at 10:30am. Pt answered at [phone] and understands he has an appt today and that a ride has been scheduled. He was encouraged to call if any additional questions/issues arise.  Patient is participating in a Managed Medicaid Plan:  Yes- [employer] Medicaid  [place]: Food Insecurity Present (09/08/2022)  Housing: High Risk (02/23/2021)  Transportation Needs: Unmet Transportation Needs (09/06/2022)  Depression (PHQ2-9): Low Risk  (07/06/2020)  Financial Resource Strain: High Risk (02/23/2021)  Tobacco Use: Low Risk  (09/08/2022)    Westley Hummer, MSW, [place]  [phone]- work cell phone (preferred) [phone]- desk phone

## 2022-09-26 ENCOUNTER — Telehealth (HOSPITAL_COMMUNITY): Payer: Self-pay | Admitting: Licensed Clinical Social Worker

## 2022-09-26 NOTE — Telephone Encounter (Signed)
H&V Care Navigation CSW Progress Note  Clinical Social Worker saw that pt had appt Friday so CSW set up Medicaid transport to come  Pick up from home 9:15-9:45pm Pick up from clinic appt 11:30-12pm Ride ID: [id number]  Attempted to call pt to inform of ride- unable to leave VM  Patient is participating in a Managed Medicaid Plan:  Yes  [place]: Food Insecurity Present (09/08/2022)  Housing: High Risk (02/23/2021)  Transportation Needs: Unmet Transportation Needs (09/06/2022)  Depression (PHQ2-9): Low Risk  (07/06/2020)  Financial Resource Strain: High Risk (02/23/2021)  Tobacco Use: Low Risk  (09/11/2022)      [name], [facility] Desk#: [phone] Cell#: [phone]

## 2022-09-27 ENCOUNTER — Telehealth (HOSPITAL_COMMUNITY): Payer: Self-pay | Admitting: Licensed Clinical Social Worker

## 2022-09-27 NOTE — Telephone Encounter (Signed)
CSW attempted to call pt again to inform of transport/appt on Friday- phone answered by male who states the phone no longer belongs to pt and hangs up.  Attempted to call other contact numbers we have for pt friends/acquaintances but no answer.  Called [employer] store which pt frequents- they report pt was just in the store 15 minutes ago and was willing to take a message to give to him next time he comes in.  Will continue to attempt contact.  [name], LCSW Clinical Social Worker [facility] Desk#: [phone] Cell#: [phone]

## 2022-09-28 ENCOUNTER — Telehealth (HOSPITAL_COMMUNITY): Payer: Self-pay | Admitting: Licensed Clinical Social Worker

## 2022-09-28 ENCOUNTER — Telehealth (HOSPITAL_COMMUNITY): Payer: Self-pay

## 2022-09-28 NOTE — Telephone Encounter (Signed)
CSW received call back from pt and was able to inform of appt with [place] tomorrow and ride details  Pt reports no barriers to attending- has new phone which CSW updated in chart and updated Medicaid transport with new number  [name], [facility] Desk#: [phone] Cell#: [phone]

## 2022-09-28 NOTE — Telephone Encounter (Signed)
Called and left patient a voice message to confirm/remind patient of their appointment at the [facility] on 09/29/22.   Left message to bring all medications and/or complete list.

## 2022-09-29 ENCOUNTER — Other Ambulatory Visit: Payer: Self-pay

## 2022-09-29 ENCOUNTER — Ambulatory Visit (HOSPITAL_BASED_OUTPATIENT_CLINIC_OR_DEPARTMENT_OTHER)
Admission: RE | Admit: 2022-09-29 | Discharge: 2022-09-29 | Disposition: A | Discharge status: Home / Self Care | Payer: Medicaid Other | Source: Ambulatory Visit | Attending: Cardiology | Admitting: Cardiology

## 2022-09-29 ENCOUNTER — Inpatient Hospital Stay (HOSPITAL_COMMUNITY)
Admission: EM | Admit: 2022-09-29 | Discharge: 2022-10-03 | DRG: 642 | Disposition: A | Discharge status: Home / Self Care | Payer: Medicaid Other | Attending: Internal Medicine | Admitting: Internal Medicine

## 2022-09-29 ENCOUNTER — Emergency Department (HOSPITAL_COMMUNITY): Payer: Medicaid Other

## 2022-09-29 ENCOUNTER — Encounter (HOSPITAL_COMMUNITY): Payer: Self-pay

## 2022-09-29 VITALS — BP 89/60 | HR 104 | Wt 146.4 lb

## 2022-09-29 DIAGNOSIS — I255 Ischemic cardiomyopathy: Secondary | ICD-10-CM | POA: Insufficient documentation

## 2022-09-29 DIAGNOSIS — Z556 Problems related to health literacy: Secondary | ICD-10-CM | POA: Insufficient documentation

## 2022-09-29 DIAGNOSIS — I447 Left bundle-branch block, unspecified: Secondary | ICD-10-CM | POA: Diagnosis present

## 2022-09-29 DIAGNOSIS — R0602 Shortness of breath: Secondary | ICD-10-CM | POA: Diagnosis not present

## 2022-09-29 DIAGNOSIS — I110 Hypertensive heart disease with heart failure: Secondary | ICD-10-CM | POA: Diagnosis present

## 2022-09-29 DIAGNOSIS — I5082 Biventricular heart failure: Secondary | ICD-10-CM | POA: Diagnosis present

## 2022-09-29 DIAGNOSIS — Z951 Presence of aortocoronary bypass graft: Secondary | ICD-10-CM | POA: Insufficient documentation

## 2022-09-29 DIAGNOSIS — Z95810 Presence of automatic (implantable) cardiac defibrillator: Secondary | ICD-10-CM | POA: Insufficient documentation

## 2022-09-29 DIAGNOSIS — I2510 Atherosclerotic heart disease of native coronary artery without angina pectoris: Secondary | ICD-10-CM | POA: Diagnosis present

## 2022-09-29 DIAGNOSIS — Z79899 Other long term (current) drug therapy: Secondary | ICD-10-CM | POA: Insufficient documentation

## 2022-09-29 DIAGNOSIS — Z8616 Personal history of COVID-19: Secondary | ICD-10-CM | POA: Insufficient documentation

## 2022-09-29 DIAGNOSIS — F1010 Alcohol abuse, uncomplicated: Secondary | ICD-10-CM | POA: Insufficient documentation

## 2022-09-29 DIAGNOSIS — Z8674 Personal history of sudden cardiac arrest: Secondary | ICD-10-CM | POA: Insufficient documentation

## 2022-09-29 DIAGNOSIS — I5043 Acute on chronic combined systolic (congestive) and diastolic (congestive) heart failure: Secondary | ICD-10-CM | POA: Diagnosis present

## 2022-09-29 DIAGNOSIS — E782 Mixed hyperlipidemia: Principal | ICD-10-CM | POA: Diagnosis present

## 2022-09-29 DIAGNOSIS — E876 Hypokalemia: Secondary | ICD-10-CM | POA: Diagnosis present

## 2022-09-29 DIAGNOSIS — Z91148 Patient's other noncompliance with medication regimen for other reason: Secondary | ICD-10-CM | POA: Insufficient documentation

## 2022-09-29 DIAGNOSIS — Z8249 Family history of ischemic heart disease and other diseases of the circulatory system: Secondary | ICD-10-CM

## 2022-09-29 DIAGNOSIS — Z91119 Patient's noncompliance with dietary regimen due to unspecified reason: Secondary | ICD-10-CM

## 2022-09-29 DIAGNOSIS — G40909 Epilepsy, unspecified, not intractable, without status epilepticus: Secondary | ICD-10-CM | POA: Insufficient documentation

## 2022-09-29 DIAGNOSIS — I252 Old myocardial infarction: Secondary | ICD-10-CM

## 2022-09-29 DIAGNOSIS — I5023 Acute on chronic systolic (congestive) heart failure: Secondary | ICD-10-CM

## 2022-09-29 DIAGNOSIS — R569 Unspecified convulsions: Secondary | ICD-10-CM

## 2022-09-29 DIAGNOSIS — I10 Essential (primary) hypertension: Secondary | ICD-10-CM | POA: Diagnosis present

## 2022-09-29 DIAGNOSIS — E871 Hypo-osmolality and hyponatremia: Secondary | ICD-10-CM | POA: Diagnosis present

## 2022-09-29 DIAGNOSIS — K219 Gastro-esophageal reflux disease without esophagitis: Secondary | ICD-10-CM | POA: Diagnosis present

## 2022-09-29 DIAGNOSIS — Z7984 Long term (current) use of oral hypoglycemic drugs: Secondary | ICD-10-CM

## 2022-09-29 LAB — BASIC METABOLIC PANEL
Anion gap: 14 (ref 5–15)
BUN: 17 mg/dL (ref 6–20)
CO2: 27 mmol/L (ref 22–32)
Calcium: 9.4 mg/dL (ref 8.9–10.3)
Chloride: 93 mmol/L — ABNORMAL LOW (ref 98–111)
Creatinine, Ser: 1.2 mg/dL (ref 0.61–1.24)
GFR, Estimated: >60 mL/min (ref >60)
Glucose, Bld: 97 mg/dL (ref 70–99)
Potassium: 3.8 mmol/L (ref 3.5–5.1)
Sodium: 134 mmol/L — ABNORMAL LOW (ref 135–145)

## 2022-09-29 LAB — CBC
HCT: 42.7 % (ref 39.0–52.0)
Hemoglobin: 14.2 g/dL (ref 13.0–17.0)
MCH: 32.3 pg (ref 26.0–34.0)
MCHC: 33.3 g/dL (ref 30.0–36.0)
MCV: 97 fL (ref 80.0–100.0)
Platelets: 156 10*3/uL (ref 150–400)
RBC: 4.4 MIL/uL (ref 4.22–5.81)
RDW: 14 % (ref 11.5–15.5)
WBC: 8.4 10*3/uL (ref 4.0–10.5)
nRBC: 0 % (ref 0.0–0.2)

## 2022-09-29 LAB — BRAIN NATRIURETIC PEPTIDE: B Natriuretic Peptide: 1964.6 pg/mL — ABNORMAL HIGH (ref 0.0–100.0)

## 2022-09-29 LAB — TROPONIN I (HIGH SENSITIVITY)
Troponin I (High Sensitivity): 12 ng/L (ref <18)
Troponin I (High Sensitivity): 11 ng/L (ref <18)

## 2022-09-29 MED ORDER — ASPIRIN 81 MG PO CHEW: 324.0000 mg | CHEWABLE_TABLET | Freq: Once | ORAL | Status: DC

## 2022-09-29 MED ORDER — FUROSEMIDE 10 MG/ML IJ SOLN
40.0000 mg | Freq: Once | INTRAMUSCULAR | Status: AC
  Administered 2022-09-29: 40 mg via INTRAVENOUS
  Filled 2022-09-29: qty 4

## 2022-09-29 NOTE — ED Provider Notes (Signed)
[facility] EMERGENCY DEPARTMENT Provider Note  CSN: [id number] Arrival date & time: 09/29/22 1128  Chief Complaint(s) Shortness of Breath  HPI [name] is a 61 y.o. male h/o ETOH abuse, VF Arrest 2018, CAD s/p CABG 12/07/17, and ICM with EF 25-30%.  Sent here from heart failure clinic for admission to treat heart failure exacerbation.  Patient reported that he been short of breath for the past several weeks with increased dyspnea on exertion with minimal exertion.  No cough or congestion.  No chest pain.  No nausea or vomiting.  Patient does have peripheral edema.  Patient has a history of noncompliance with medication and continued daily alcohol use.     The history is provided by the patient.    Past Medical History Past Medical History:  Diagnosis Date   AICD (automatic cardioverter/defibrillator) present    Alcohol abuse 05/18/2020   Arthritis    "hands; maybe my toes" (12/20/2017)   CAD (coronary artery disease)    Cardiac arrest (HCC) 11/29/2017   hx VF arrest/notes 12/20/2017   CHF (congestive heart failure) (HCC)    Chronic systolic heart failure (HCC) 08/25/2019   Coronary artery disease    Coronary artery disease involving native coronary artery of native heart without angina pectoris 12/07/2017   Essential hypertension 05/18/2020   GERD (gastroesophageal reflux disease)    H/O ETOH abuse    /notes 12/20/2017   High cholesterol    Hypertension    Ischemic cardiomyopathy    a. 08/2019 s/p BSX D232 single lead AICD.   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) 12/30/2019   Syncope and collapse    "last few days" (12/20/2017)   Patient Active Problem List   Diagnosis Date Noted   Acute on chronic systolic CHF (congestive heart  failure) (HCC) 09/30/2022   Coronary artery disease    Closed fracture of right upper limb 01/27/2021   Pneumonia due to COVID-19 virus 08/14/2020   Hyponatremia 08/14/2020   Elevated AST (SGOT) 08/14/2020   Prolonged QT interval 08/14/2020   Cardiac arrest, cause unspecified (HCC) 07/06/2020   Left arm cellulitis 05/19/2020   Essential hypertension 05/18/2020   Mixed hyperlipidemia 05/18/2020   Alcohol abuse 05/18/2020   Cellulitis of left upper extremity 05/18/2020   Lactic acidosis 05/18/2020   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 Authorizing Provider  furosemide (LASIX) 20 MG tablet Take 1 tablet (20 mg total) by mouth daily. 09/11/22   Andrey Farmer, PA-C  levETIRAcetam (KEPPRA) 500 MG tablet TAKE 1 TABLET BY MOUTH 2 TIMES DAILY 01/04/21   Anson Fret, MD  potassium chloride SA (KLOR-CON M) 20 MEQ tablet Take 1 tablet (20 mEq total) by mouth daily. 09/11/22   Andrey Farmer, PA-C  Allergies Spironolactone  Review of Systems Review of Systems As noted in HPI  Physical Exam Vital Signs  I have reviewed the triage vital signs BP 112/89   Pulse 91   Temp 97.8 F (36.6 C) (Oral)   Resp 17   Ht 5\' 10"  (1.778 m)   Wt 66.2 kg   SpO2 98%   BMI 20.95 kg/m   Physical Exam Vitals reviewed.  Constitutional:      General: He is not in acute distress.    Appearance: He is  well-developed. He is not diaphoretic.  HENT:     Head: Normocephalic and atraumatic.     Nose: Nose normal.  Eyes:     General: No scleral icterus.       Right eye: No discharge.        Left eye: No discharge.     Conjunctiva/sclera: Conjunctivae normal.     Pupils: Pupils are equal, round, and reactive to light.  Neck:     Vascular: JVD present.  Cardiovascular:     Rate and Rhythm: Normal rate and regular rhythm.     Heart sounds: No murmur heard.    No friction rub. No gallop.  Pulmonary:     Effort: Pulmonary effort is normal. No respiratory distress.     Breath sounds: Normal breath sounds. No stridor. No rales.  Abdominal:     General: There is no distension.     Palpations: Abdomen is soft.     Tenderness: There is no abdominal tenderness.  Musculoskeletal:        General: No tenderness.     Cervical back: Normal range of motion and neck supple.     Right lower leg: 2+ Pitting Edema present.     Left lower leg: 2+ Pitting Edema present.  Skin:    General: Skin is warm and dry.     Findings: No erythema or rash.  Neurological:     Mental Status: He is alert and oriented to person, place, and time.     ED Results and Treatments Labs (all labs ordered are listed, but only abnormal results are displayed) Labs Reviewed  BASIC METABOLIC PANEL - Abnormal; Notable for the following components:      Result Value   Sodium 134 (*)    Chloride 93 (*)    All other components within normal limits  BRAIN NATRIURETIC PEPTIDE - Abnormal; Notable for the following components:   B Natriuretic Peptide 1,964.6 (*)    All other components within normal limits  HEPATIC FUNCTION PANEL - Abnormal; Notable for the following components:   Total Bilirubin 3.2 (*)    Bilirubin, Direct 0.7 (*)    Indirect Bilirubin 2.5 (*)    All other components within normal limits  CBC  ETHANOL  AMMONIA  RAPID URINE DRUG SCREEN, HOSP PERFORMED  HIV ANTIBODY (ROUTINE TESTING W REFLEX)  BASIC  METABOLIC PANEL  MAGNESIUM  HEPATIC FUNCTION PANEL  CBC  TROPONIN I (HIGH SENSITIVITY)  TROPONIN I (HIGH SENSITIVITY)  EKG  EKG Interpretation  Date/Time:  Friday September 29 2022 12:56:14 EDT Ventricular Rate:  99 PR Interval:  126 QRS Duration: 162 QT Interval:  424 QTC Calculation: 544 R Axis:   112 Text Interpretation: Sinus rhythm with Fusion complexes Left bundle branch block Abnormal ECG When compared with ECG of 29-Sep-2022 10:51, PREVIOUS ECG IS PRESENT Confirmed by Drema Pry [phone]) on 09/29/2022 11:15:26 PM       Radiology DG Chest 2 View  Result Date: 09/29/2022 CLINICAL DATA:  Shortness of breath for 1 week EXAM: CHEST - 2 VIEW COMPARISON:  Chest x-ray July 23, 2022, chest CT August 14, 2020 FINDINGS: Intact left chest wall AICD. Stable cardiomediastinal contours status post median sternotomy and CABG. Unchanged elevation of the left hemidiaphragm with left basilar atelectasis/scarring. No new focal pulmonary opacity. No large pneumothorax or pleural effusion. No acute osseous abnormality. The visualized upper abdomen is unremarkable. IMPRESSION: No acute cardiopulmonary abnormality. Electronically Signed   By: Jacob Moores M.D.   On: 09/29/2022 13:51    Medications Ordered in ED Medications  aspirin chewable tablet 324 mg (324 mg Oral Not Given 09/29/22 2332)  levETIRAcetam (KEPPRA) tablet 500 mg (has no administration in time range)  enoxaparin (LOVENOX) injection 40 mg (has no administration in time range)  sodium chloride flush (NS) 0.9 % injection 3 mL (has no administration in time range)  acetaminophen (TYLENOL) tablet 650 mg (has no administration in time range)    Or  acetaminophen (TYLENOL) suppository 650 mg (has no administration in time range)  senna-docusate (Senokot-S) tablet 1 tablet (has no administration in time range)   furosemide (LASIX) injection 40 mg (has no administration in time range)  potassium chloride SA (KLOR-CON M) CR tablet 20 mEq (has no administration in time range)  LORazepam (ATIVAN) tablet 1-4 mg (has no administration in time range)    Or  LORazepam (ATIVAN) injection 1-4 mg (has no administration in time range)  thiamine (VITAMIN B1) tablet 100 mg (has no administration in time range)    Or  thiamine (VITAMIN B1) injection 100 mg (has no administration in time range)  folic acid (FOLVITE) tablet 1 mg (has no administration in time range)  multivitamin with minerals tablet 1 tablet (has no administration in time range)  LORazepam (ATIVAN) injection 0-4 mg (has no administration in time range)    Followed by  LORazepam (ATIVAN) injection 0-4 mg (has no administration in time range)  atorvastatin (LIPITOR) tablet 80 mg (has no administration in time range)  aspirin EC tablet 81 mg (has no administration in time range)  furosemide (LASIX) injection 40 mg (40 mg Intravenous Given 09/29/22 2354)                                                                                                                                     Procedures .1-3 Lead EKG Interpretation  Performed by: Nira Conn, MD Authorized by:  Fatima Blank, MD     Interpretation: normal     ECG rate:  92   Rhythm: sinus rhythm     Ectopy: none     Conduction: normal   Ultrasound ED Echo  Date/Time: 09/30/2022 1:14 AM  Performed by: Fatima Blank, MD Authorized by: Fatima Blank, MD   Procedure details:    Indications: dyspnea     Images: archived     Limitations:  Acoustic shadowing Findings:    Pericardium: no pericardial effusion     LV Function: depressed (30 - 50%)     RV Diameter: normal   Impression:    Impression: decreased contractility   .Critical Care  Performed by: Fatima Blank, MD Authorized by: Fatima Blank, MD   Critical care  provider statement:    Critical care time (minutes):  45   Critical care time was exclusive of:  Separately billable procedures and treating other patients   Critical care was necessary to treat or prevent imminent or life-threatening deterioration of the following conditions:  Cardiac failure   Critical care was time spent personally by me on the following activities:  Development of treatment plan with patient or surrogate, discussions with consultants, evaluation of patient's response to treatment, examination of patient, obtaining history from patient or surrogate, review of old charts, re-evaluation of patient's condition, pulse oximetry, ordering and review of radiographic studies, ordering and review of laboratory studies and ordering and performing treatments and interventions   Care discussed with: admitting provider     (including critical care time)  Medical Decision Making / ED Course   Medical Decision Making Amount and/or Complexity of Data Reviewed External Data Reviewed: notes.    Details: ECHO 12/2019 EF 30-35% CHF clinic note : recommend he go to ED for admission by IM. AHF team will follow along closely  Labs: ordered. Decision-making details documented in ED Course. Radiology: ordered and independent interpretation performed. Decision-making details documented in ED Course. ECG/medicine tests: ordered and independent interpretation performed. Decision-making details documented in ED Course.  Risk Prescription drug management. Decision regarding hospitalization.  Patient presents with shortness of breath and dyspnea on exertion consistent with CHF exacerbation.  Sent from heart failure clinic for admission.  In the clinic patient's BPs were low with systolics in the 10U.  Currently SBPs are in the 110s 120s.  Labs without leukocytosis or anemia. Metabolic panel without significant electrolyte derangement or renal sufficiency. BNP elevated at 1900. Chest x-ray without  evidence of pneumonia, pneumothorax, pulmonary edema or pleural effusions.  When compared to prior chest x-rays from last year, patient now has cardiomegaly. Bedside ultrasound was performed to assess for possible pericardial effusion.  No pericardial effusion noted on bedside ultrasound.  Depressed LV function noted.  IV lasix given.  Patient is slow to answer questions with yellow hued skin, with a history of alcohol abuse, LFTs, methanol, and ammonia level added.   Will admit to medicine.  I spoke with Dr. Myna Hidalgo from the hospitalist service who agreed to admit for further work-up and management. HF team to follow.    Final Clinical Impression(s) / ED Diagnoses Final diagnoses:  Acute on chronic systolic congestive heart failure ([place])           This chart was dictated using voice recognition software.  Despite best efforts to proofread,  errors can occur which can change the documentation meaning.    Fatima Blank, MD 09/30/22 [phone]

## 2022-09-29 NOTE — Addendum Note (Signed)
Encounter addended by: Consuelo Pandy, PA-C on: 09/29/2022 12:19 PM  Actions taken: Clinical Note Signed

## 2022-09-29 NOTE — Patient Instructions (Signed)
Thank you for coming in today  Labs were done today, if any labs are abnormal the clinic will call you No news is good news     Your physician recommends that you schedule a follow-up appointment in:     Do the following things EVERYDAY: Weigh yourself in the morning before breakfast. Write it down and keep it in a log. Take your medicines as prescribed Eat low salt foods--Limit salt (sodium) to 2000 mg per day.  Stay as active as you can everyday Limit all fluids for the day to less than 2 liters  At the [facility], you and your health needs are our priority. As part of our continuing mission to provide you with exceptional heart care, we have created designated Provider Care Teams. These Care Teams include your primary Cardiologist (physician) and Advanced Practice Providers (APPs- Physician Assistants and Nurse Practitioners) who all work together to provide you with the care you need, when you need it.   You may see any of the following providers on your designated Care Team at your next follow up: Dr Glori Bickers Dr Loralie Champagne Dr. Roxana Hires, NP Lyda Jester, [place] [facility] [place], [place] Forestine Na, NP Audry Riles, PharmD   Please be sure to bring in all your medications bottles to every appointment.   If you have any questions or concerns before your next appointment please send [place] a message through [place] or call our office at [phone].    TO LEAVE A MESSAGE FOR THE NURSE SELECT OPTION 2, PLEASE LEAVE A MESSAGE INCLUDING: YOUR NAME DATE OF BIRTH CALL BACK NUMBER REASON FOR CALL**this is important as we prioritize the call backs  YOU WILL RECEIVE A CALL BACK THE SAME DAY AS LONG AS YOU CALL BEFORE 4:00 PM

## 2022-09-29 NOTE — Progress Notes (Addendum)
ADVANCED HF CLINIC NOTE  PCP: Geryl Rankins, NP HF Cardiologist: Dr Haroldine Laws   Reason for Visit: F/u for chronic systolic heart failure   HPI: [name] is a 61 y.o. male with h/o ETOH abuse, VF Arrest 2018, CAD s/p CABG 12/07/17, and ICM with EF 25-30%.   Admitted 12/18 with VF arrest while chopping wood. Received CPR in the field. R/LHC with severe 3vD as below and cardiogenic shock requiring IABP and milrinone. Required milrinone with continued shock. Pt improved and IABP removed 12/10, but pt had recurrent VF arrest so IABP replaced. Stabilized and underwent CABG 12/07/17. Tolerated gradual wean of meds and extubation with no further events.  Echo 04/2019  LVEF at 25%.    9/20 implantation of a [employer] single chamber ICD.   He had routine clinic f/u on 10/23/19 and while in exam room had a witnessed seizure  with loss of pulse. CPR started with quick ROSC. He did not require shock. Taken to the ED and had another seizure. Neurology consulted. Started on Keppra. ICD interrogated. No arrhythmias noted. Admitted to [facility] on 12/09/19 with recurrent seizures in setting of noncompliance with his Keppra.    Admitted 8/21 with COVID PNA.   Follow up 5/22 he was having facial and arm tingling, no other neuro symptoms. Ethanol level 145. Carvedilol increased and carotid u/s ordered showing bilateral ICA 1-39%.  Follow up 9/22, he was off all meds. Resumed meds and re-enrolled with [facility]amedicine.  Follow up 10/22, he remained off all meds, NYHA II. Meds restarted, but lost to follow up.   Seen recently in clinic 9/18 and had been off all of his meds except Keppra. Felt terrible. SOB walking short distances. Reported intermittent chest tightness and occasional dizziness. Denied edema, palpitations and Orthopnea. + PND. Paramedicine had been unable to make contact so he was discharged. Drinking 1-2 beers/day. He was fluid overloaded. HL score 61. ReDs 48%.  No VT on device interrogation. He was given IV Lasix + dose of metolazone in clinic and instructed to restart PO Lasix next day. BP was too soft to restart other GDMT.   He presents back to clinic today for f/u to reassess volume status and consideration for admission if still fluid overload.   He says he feels "terrible". SOB w/ minimal activity and at times SOB at rest. NYHA Class IIIb-IV. Fatigued. JVD elevated to ear + prominent V-waves and abdominal edema. BP soft, 89/60. Pulse rate 104 bpm. Feels lightheaded. O2 sats 97% on RA. Reports drinking 6 pack of beer/day.   Fluid index trending down but still elevated, HL score 20. No VT on device interrogation    ROS: All systems negative except as listed in HPI, PMH and Problem List.  SH:  Social History   Socioeconomic History   Marital status: Divorced    Spouse name: Not on file   Number of children: Not on file   Years of education: Not on file   Highest education level: Not on file  Occupational History   Not on file  Tobacco Use   Smoking status: Never   Smokeless tobacco: Never  Vaping Use   Vaping Use: Never used  Substance and Sexual Activity   Alcohol use: Yes    Alcohol/week: 14.0 standard drinks of alcohol    Types: 14 Cans of beer per week    Comment: patient endorses at least 1-2 beers daily (04/2020)   Drug  use: No   Sexual activity: Not Currently  Other Topics Concern   Not on file  Social History Narrative   Lives at home with his brother who is crippled. He takes care of his brother.    Right handed    Social Determinants of Health   Financial Resource Strain: High Risk (02/23/2021)   Overall Financial Resource Strain (CARDIA)    Difficulty of Paying Living Expenses: Hard  Food Insecurity: Food Insecurity Present (09/08/2022)   Hunger Vital Sign    Worried About Running Out of Food in the Last Year: Often true    Ran Out of Food in the Last Year: Often true  Transportation Needs: Unmet Transportation  Needs (09/06/2022)   PRAPARE - Hydrologist (Medical): Yes    Lack of Transportation (Non-Medical): Yes  Physical Activity: Not on file  Stress: Not on file  Social Connections: Not on file  Intimate Partner Violence: Not on file   FH:  Family History  Problem Relation Age of Onset   Heart attack Other    Diabetes Neg Hx    Past Medical History:  Diagnosis Date   AICD (automatic cardioverter/defibrillator) present    Alcohol abuse 05/18/2020   Arthritis    "hands; maybe my toes" (12/20/2017)   CAD (coronary artery disease)    Cardiac arrest ([place]) 11/29/2017   hx VF arrest/notes 12/20/2017   CHF (congestive heart failure) (HCC)    Chronic systolic heart failure ([place]) 08/25/2019   Coronary artery disease    Coronary artery disease involving native coronary artery of native heart without angina pectoris 12/07/2017   Essential hypertension 05/18/2020   GERD (gastroesophageal reflux disease)    H/O ETOH abuse    /notes 12/20/2017   High cholesterol    Hypertension    Ischemic cardiomyopathy    a. 08/2019 s/p BSX D232 single lead AICD.   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 30 tablet 3   levETIRAcetam (KEPPRA) 500 MG tablet TAKE 1 TABLET BY MOUTH 2 TIMES DAILY 180 tablet 3   potassium chloride SA (KLOR-CON M) 20 MEQ tablet Take 1 tablet (20 mEq total) by mouth daily. 30 tablet 3   No current facility-administered medications for this encounter.   BP (!) 89/60   Pulse (!) 104   Wt 66.4 kg (146 lb 6.4 oz)   SpO2 97%   BMI 21.01  kg/m   Wt Readings from Last 3 Encounters:  09/29/22 66.4 kg (146 lb 6.4 oz)  09/11/22 65.1 kg (143 lb 9.6 oz)  09/08/22 68.9 kg (152 lb)   PHYSICAL EXAM: General:  fatigued appearing. Looks much older than actual age. No respiratory difficulty HEENT: normal Neck: supple. JVD elevated to ear, + marked V waves. Carotids 2+ bilat; no bruits. No lymphadenopathy or thyromegaly appreciated. Cor: PMI laterally displaced. Regular rhythm tachy rate. 3/6 TR murmur  Lungs: decreased BS at the bases bilaterally. No wheezing  Abdomen: soft, nontender, +distended. +hepatosplenomegaly. No bruits or masses. Good bowel sounds. Extremities: no cyanosis, clubbing, rash, trace b/l  LE edema, distal extremities cool  Neuro: alert & oriented x 3, cranial nerves grossly intact. moves all 4 extremities w/o difficulty. Affect pleasant.   Device interrogation: HL Score 20, 2.4 hrs/day of activity, no VT, average HR 98 bpm (personally reviewed).  ECG: SR 99 bpm w/ nonspecific IV conduction delay    ASSESSMENT & PLAN: 1. Acute On Chronic systolic CHF with Concern for Low-Output, NYHA Class IIIb-IV   - Echo 11/30/17 25-30% RV normal.   - Echo 03/2018: EF 40-45%  RV normal.  - Echo 5/20 EF ~ 25%. Has ICD.  - Echo 12/2019 EF 30-35% RV normal. - Mixed Ischemic + probable ETOH mediated component  - Poor compliance and ongoing ETOH abuse. He had recently been off all GDMT for several months. Here in clinic w/ marked fluid overload, NYHA Class lllb-IV symptoms and concern for low-output. Needs admission for IV Lasix and possible short term inotropic support if poor response to IV diuretics.  - BP currently too soft for addition of GDMT at this time  - repeat 2D echo, suspected significant TR based on presence of prominent V waves on exam  - suspect he is now end-stage HF. Not a candidate for advanced therapies due to ongoing alcohol abuse and poor compliance. This includes home inotrope's  - Pt also seen and examined  by Dr. Daniel Nones who agrees that he needs admission. Given multiple medical problems, recommend he go to ED for admission by IM. AHF team will follow along closely   2. CAD  - s/p CABG 12/07/2017 - reports chest tightness, suspect due to volume overload. Will need diuresis  - continue medical management. I don't think there would be any benefit to [facility], as he would not be a suitable candidate for PCI/coronary stenting, if presence of obstructive disease, given poor compliance and doubtful adherence to strict GDMT  - Restart ASA and atorvastatin - no ? blocker w/ suspected low output HF and hypotension   3. H/o VF arrest in setting of STEMI - Has ICD, no VT on device interrogation today  4. Seizures - Denies any recent recurrence.  - Continue Keppra 500 mg bid.  - Followed by Neurology.    5. ETOH abuse  - Heavy use, 6 pack beer/day  - He has no plans to quit - needs CIWA protocol on admission    6. Medication Noncompliance/ Poor Health Literacy  - Discharged previously from [facility] as they could not get in contact with him x 2. - Discussed importance of strict compliance w/ meds.  As noted above, Pt also seen and examined by Dr. Daniel Nones who agrees that he needs admission. Given multiple medical problems, including seizure disorder, heavy ETOH abuse/ risk of withdrawal symptoms, recommend he go to ED for admission by IM team. AHF team will follow along closely as consultants.     Lyda Jester, PA-C  12:07 PM

## 2022-09-29 NOTE — ED Provider Triage Note (Signed)
Emergency Medicine Provider Triage Evaluation Note  [name] , a 61 y.o. male  was evaluated in triage.  Pt complains of worsening chest pressure and associated shortness of breath, intermittent, over the last week.  Tried eating with his cardiologist, but states that he is out of town.  Denies recent upper respiratory symptoms, fever, chills, N/V/D.  Denies recent fluid retention.  Significant cardiac history for CABG X4, chronic CHF, HTN, hyperlipidemia, alcohol abuse, prior cardiac arrest, prolonged QT, and present ICD.  Review of Systems  Positive:  Negative: See above  Physical Exam  BP (!) 137/96 (BP Location: Left Arm)   Pulse 98   Temp 98.1 F (36.7 C) (Oral)   Resp 14   Ht 5\' 10"  (1.778 m)   Wt 66.2 kg   SpO2 97%   BMI 20.95 kg/m  Gen:   Awake, no distress   Resp:  Normal effort  MSK:   Moves extremities without difficulty  Other:  Chest non-TTP.  Abdomen soft, non-TTP.  No pulsatile abdominal mass appreciated.  Medical Decision Making  Medically screening exam initiated at 1:29 PM.  Appropriate orders placed.  Mory Herrman was informed that the remainder of the evaluation will be completed by another provider, this initial triage assessment does not replace that evaluation, and the importance of remaining in the ED until their evaluation is complete.     Prince Rome, PA-C 68/03/21 1333

## 2022-09-29 NOTE — ED Triage Notes (Signed)
Patient reports he is a heart problem and he is sob of breath and weak.  Hx of CHF.  No swelling noted while triaging nor respiratory distress.

## 2022-09-30 ENCOUNTER — Encounter (HOSPITAL_COMMUNITY): Payer: Self-pay | Admitting: Family Medicine

## 2022-09-30 ENCOUNTER — Inpatient Hospital Stay (HOSPITAL_COMMUNITY): Payer: Medicaid Other

## 2022-09-30 DIAGNOSIS — I110 Hypertensive heart disease with heart failure: Secondary | ICD-10-CM | POA: Diagnosis not present

## 2022-09-30 DIAGNOSIS — Z91119 Patient's noncompliance with dietary regimen due to unspecified reason: Secondary | ICD-10-CM | POA: Diagnosis not present

## 2022-09-30 DIAGNOSIS — I5023 Acute on chronic systolic (congestive) heart failure: Secondary | ICD-10-CM | POA: Diagnosis not present

## 2022-09-30 DIAGNOSIS — Z8616 Personal history of COVID-19: Secondary | ICD-10-CM | POA: Diagnosis not present

## 2022-09-30 DIAGNOSIS — F1010 Alcohol abuse, uncomplicated: Secondary | ICD-10-CM

## 2022-09-30 DIAGNOSIS — R569 Unspecified convulsions: Secondary | ICD-10-CM

## 2022-09-30 DIAGNOSIS — I10 Essential (primary) hypertension: Secondary | ICD-10-CM | POA: Diagnosis not present

## 2022-09-30 DIAGNOSIS — R0602 Shortness of breath: Secondary | ICD-10-CM | POA: Diagnosis not present

## 2022-09-30 DIAGNOSIS — E871 Hypo-osmolality and hyponatremia: Secondary | ICD-10-CM | POA: Diagnosis present

## 2022-09-30 DIAGNOSIS — K219 Gastro-esophageal reflux disease without esophagitis: Secondary | ICD-10-CM | POA: Diagnosis present

## 2022-09-30 DIAGNOSIS — Z8249 Family history of ischemic heart disease and other diseases of the circulatory system: Secondary | ICD-10-CM | POA: Diagnosis not present

## 2022-09-30 DIAGNOSIS — I2510 Atherosclerotic heart disease of native coronary artery without angina pectoris: Secondary | ICD-10-CM | POA: Diagnosis present

## 2022-09-30 DIAGNOSIS — Z7984 Long term (current) use of oral hypoglycemic drugs: Secondary | ICD-10-CM | POA: Diagnosis not present

## 2022-09-30 DIAGNOSIS — I447 Left bundle-branch block, unspecified: Secondary | ICD-10-CM | POA: Diagnosis present

## 2022-09-30 DIAGNOSIS — I5082 Biventricular heart failure: Secondary | ICD-10-CM | POA: Diagnosis present

## 2022-09-30 DIAGNOSIS — E782 Mixed hyperlipidemia: Secondary | ICD-10-CM | POA: Diagnosis present

## 2022-09-30 DIAGNOSIS — E876 Hypokalemia: Secondary | ICD-10-CM | POA: Diagnosis present

## 2022-09-30 DIAGNOSIS — I5043 Acute on chronic combined systolic (congestive) and diastolic (congestive) heart failure: Secondary | ICD-10-CM

## 2022-09-30 DIAGNOSIS — Z951 Presence of aortocoronary bypass graft: Secondary | ICD-10-CM | POA: Diagnosis not present

## 2022-09-30 DIAGNOSIS — I252 Old myocardial infarction: Secondary | ICD-10-CM | POA: Diagnosis not present

## 2022-09-30 DIAGNOSIS — I5031 Acute diastolic (congestive) heart failure: Secondary | ICD-10-CM | POA: Diagnosis not present

## 2022-09-30 DIAGNOSIS — Z95810 Presence of automatic (implantable) cardiac defibrillator: Secondary | ICD-10-CM | POA: Diagnosis not present

## 2022-09-30 DIAGNOSIS — Z79899 Other long term (current) drug therapy: Secondary | ICD-10-CM | POA: Diagnosis not present

## 2022-09-30 DIAGNOSIS — Z91148 Patient's other noncompliance with medication regimen for other reason: Secondary | ICD-10-CM | POA: Diagnosis not present

## 2022-09-30 DIAGNOSIS — I255 Ischemic cardiomyopathy: Secondary | ICD-10-CM | POA: Diagnosis present

## 2022-09-30 DIAGNOSIS — G40909 Epilepsy, unspecified, not intractable, without status epilepticus: Secondary | ICD-10-CM | POA: Diagnosis present

## 2022-09-30 HISTORY — DX: Hypokalemia: E87.6

## 2022-09-30 HISTORY — DX: Acute on chronic combined systolic (congestive) and diastolic (congestive) heart failure: I50.43

## 2022-09-30 LAB — HEPATIC FUNCTION PANEL
ALT: 18 U/L (ref 0–44)
ALT: 18 U/L (ref 0–44)
AST: 31 U/L (ref 15–41)
AST: 27 U/L (ref 15–41)
Albumin: 3.5 g/dL (ref 3.5–5.0)
Albumin: 3.3 g/dL — ABNORMAL LOW (ref 3.5–5.0)
Alkaline Phosphatase: 69 U/L (ref 38–126)
Alkaline Phosphatase: 65 U/L (ref 38–126)
Bilirubin, Direct: 0.7 mg/dL — ABNORMAL HIGH (ref 0.0–0.2)
Bilirubin, Direct: 0.7 mg/dL — ABNORMAL HIGH (ref 0.0–0.2)
Indirect Bilirubin: 2.5 mg/dL — ABNORMAL HIGH (ref 0.3–0.9)
Indirect Bilirubin: 2.2 mg/dL — ABNORMAL HIGH (ref 0.3–0.9)
Total Bilirubin: 3.2 mg/dL — ABNORMAL HIGH (ref 0.3–1.2)
Total Bilirubin: 2.9 mg/dL — ABNORMAL HIGH (ref 0.3–1.2)
Total Protein: 6.5 g/dL (ref 6.5–8.1)
Total Protein: 6.1 g/dL — ABNORMAL LOW (ref 6.5–8.1)

## 2022-09-30 LAB — CBC
HCT: 40.3 % (ref 39.0–52.0)
Hemoglobin: 13.9 g/dL (ref 13.0–17.0)
MCH: 32.6 pg (ref 26.0–34.0)
MCHC: 34.5 g/dL (ref 30.0–36.0)
MCV: 94.6 fL (ref 80.0–100.0)
Platelets: 153 10*3/uL (ref 150–400)
RBC: 4.26 MIL/uL (ref 4.22–5.81)
RDW: 13.8 % (ref 11.5–15.5)
WBC: 6.4 10*3/uL (ref 4.0–10.5)
nRBC: 0 % (ref 0.0–0.2)

## 2022-09-30 LAB — ETHANOL: Alcohol, Ethyl (B): <10 mg/dL (ref <10)

## 2022-09-30 LAB — BASIC METABOLIC PANEL
Anion gap: 9 (ref 5–15)
BUN: 16 mg/dL (ref 6–20)
CO2: 28 mmol/L (ref 22–32)
Calcium: 8.8 mg/dL — ABNORMAL LOW (ref 8.9–10.3)
Chloride: 98 mmol/L (ref 98–111)
Creatinine, Ser: 1.15 mg/dL (ref 0.61–1.24)
GFR, Estimated: >60 mL/min (ref >60)
Glucose, Bld: 132 mg/dL — ABNORMAL HIGH (ref 70–99)
Potassium: 2.9 mmol/L — ABNORMAL LOW (ref 3.5–5.1)
Sodium: 135 mmol/L (ref 135–145)

## 2022-09-30 LAB — HIV ANTIBODY (ROUTINE TESTING W REFLEX): HIV Screen 4th Generation wRfx: NONREACTIVE

## 2022-09-30 LAB — RAPID URINE DRUG SCREEN, HOSP PERFORMED
Amphetamines: POSITIVE — AB
Barbiturates: NOT DETECTED
Benzodiazepines: NOT DETECTED
Cocaine: NOT DETECTED
Opiates: NOT DETECTED
Tetrahydrocannabinol: NOT DETECTED

## 2022-09-30 LAB — MAGNESIUM: Magnesium: 1.8 mg/dL (ref 1.7–2.4)

## 2022-09-30 LAB — ECHOCARDIOGRAM COMPLETE
Area-P 1/2: 5.66 cm2
S' Lateral: 5.2 cm

## 2022-09-30 LAB — AMMONIA: Ammonia: 36 umol/L — ABNORMAL HIGH (ref 9–35)

## 2022-09-30 MED ORDER — LEVETIRACETAM 500 MG PO TABS
500.0000 mg | ORAL_TABLET | Freq: Two times a day (BID) | ORAL | Status: DC
  Administered 2022-09-30 – 2022-10-03 (×8): 500 mg via ORAL
  Filled 2022-09-30 (×8): qty 1

## 2022-09-30 MED ORDER — LORAZEPAM 2 MG/ML IJ SOLN: 0.0000 mg | Freq: Two times a day (BID) | INTRAMUSCULAR | Status: DC

## 2022-09-30 MED ORDER — ADULT MULTIVITAMIN W/MINERALS CH
1.0000 | ORAL_TABLET | Freq: Every day | ORAL | Status: DC
  Administered 2022-09-30 – 2022-10-03 (×4): 1 via ORAL
  Filled 2022-09-30 (×4): qty 1

## 2022-09-30 MED ORDER — SODIUM CHLORIDE 0.9% FLUSH
3.0000 mL | Freq: Two times a day (BID) | INTRAVENOUS | Status: DC
  Administered 2022-09-30 – 2022-10-03 (×8): 3 mL via INTRAVENOUS

## 2022-09-30 MED ORDER — EMPAGLIFLOZIN 10 MG PO TABS: 10.0000 mg | ORAL_TABLET | Freq: Every day | ORAL | Status: DC

## 2022-09-30 MED ORDER — FOLIC ACID 1 MG PO TABS
1.0000 mg | ORAL_TABLET | Freq: Every day | ORAL | Status: DC
  Administered 2022-09-30 – 2022-10-01 (×2): 1 mg via ORAL
  Filled 2022-09-30 (×2): qty 1

## 2022-09-30 MED ORDER — ATORVASTATIN CALCIUM 80 MG PO TABS
80.0000 mg | ORAL_TABLET | Freq: Every day | ORAL | Status: DC
  Administered 2022-09-30 – 2022-10-03 (×4): 80 mg via ORAL
  Filled 2022-09-30 (×4): qty 1

## 2022-09-30 MED ORDER — EMPAGLIFLOZIN 10 MG PO TABS
10.0000 mg | ORAL_TABLET | Freq: Every day | ORAL | Status: DC
  Administered 2022-09-30 – 2022-10-03 (×4): 10 mg via ORAL
  Filled 2022-09-30 (×4): qty 1

## 2022-09-30 MED ORDER — FUROSEMIDE 10 MG/ML IJ SOLN
40.0000 mg | Freq: Two times a day (BID) | INTRAMUSCULAR | Status: DC
  Administered 2022-09-30: 40 mg via INTRAVENOUS
  Filled 2022-09-30: qty 4

## 2022-09-30 MED ORDER — ASPIRIN 81 MG PO TBEC
81.0000 mg | DELAYED_RELEASE_TABLET | Freq: Every day | ORAL | Status: DC
  Administered 2022-09-30 – 2022-10-03 (×4): 81 mg via ORAL
  Filled 2022-09-30 (×4): qty 1

## 2022-09-30 MED ORDER — ACETAMINOPHEN 650 MG RE SUPP: 650.0000 mg | Freq: Four times a day (QID) | RECTAL | Status: DC | PRN

## 2022-09-30 MED ORDER — THIAMINE MONONITRATE 100 MG PO TABS
100.0000 mg | ORAL_TABLET | Freq: Every day | ORAL | Status: DC
  Administered 2022-09-30 – 2022-10-01 (×2): 100 mg via ORAL
  Filled 2022-09-30 (×2): qty 1

## 2022-09-30 MED ORDER — POTASSIUM CHLORIDE CRYS ER 20 MEQ PO TBCR
40.0000 meq | EXTENDED_RELEASE_TABLET | ORAL | Status: AC
  Administered 2022-09-30 (×2): 40 meq via ORAL
  Filled 2022-09-30 (×2): qty 2

## 2022-09-30 MED ORDER — POTASSIUM CHLORIDE CRYS ER 20 MEQ PO TBCR
20.0000 meq | EXTENDED_RELEASE_TABLET | Freq: Every day | ORAL | Status: DC
  Administered 2022-09-30: 20 meq via ORAL
  Filled 2022-09-30: qty 1

## 2022-09-30 MED ORDER — LORAZEPAM 2 MG/ML IJ SOLN: 1.0000 mg | INTRAMUSCULAR | Status: DC | PRN

## 2022-09-30 MED ORDER — ACETAMINOPHEN 325 MG PO TABS: 650.0000 mg | ORAL_TABLET | Freq: Four times a day (QID) | ORAL | Status: DC | PRN

## 2022-09-30 MED ORDER — LORAZEPAM 1 MG PO TABS: 1.0000 mg | ORAL_TABLET | ORAL | Status: DC | PRN

## 2022-09-30 MED ORDER — ENOXAPARIN SODIUM 40 MG/0.4ML IJ SOSY
40.0000 mg | PREFILLED_SYRINGE | INTRAMUSCULAR | Status: DC
  Administered 2022-09-30: 40 mg via SUBCUTANEOUS
  Filled 2022-09-30: qty 0.4

## 2022-09-30 MED ORDER — THIAMINE HCL 100 MG/ML IJ SOLN: 100.0000 mg | Freq: Every day | INTRAMUSCULAR | Status: DC

## 2022-09-30 MED ORDER — LORAZEPAM 2 MG/ML IJ SOLN: 0.0000 mg | Freq: Four times a day (QID) | INTRAMUSCULAR | Status: DC

## 2022-09-30 MED ORDER — SENNOSIDES-DOCUSATE SODIUM 8.6-50 MG PO TABS: 1.0000 | ORAL_TABLET | Freq: Every evening | ORAL | Status: DC | PRN

## 2022-09-30 NOTE — Assessment & Plan Note (Addendum)
SP CABG on 11/2017  Contin with aspirin and statin therapy.  No chest pain.

## 2022-09-30 NOTE — Progress Notes (Signed)
Progress Note   Patient: [name] [id number]:[id number] DOB: 1961-10-06 DOA: 09/29/2022     0 DOS: the patient was seen and examined on 09/30/2022   Brief hospital course: Mr. Hanf was admitted to the hospital with the working diagnosis of decompensated heart failure.   61 yo male with the past medical history of coronary artery disease sp CABG, heart failure, seizures and alcohol abuse who presented with dyspnea and fatigue. Patient had progressive dyspnea on exertion, getting symptomatic with minimal efforts. He was evaluated the heart failure clinical and was noted to be hypotensive and hypovolemic, and he was referred to the ED for further evaluation. On his initial physical examination in the ED his blood pressure was 130/97, HR 103, RR 14 and 02 saturation 98%, lungs with no wheezing, or rhonchi, heart with S1 and S2 present and rhythmic with no gallops, positive JVD, no abdominal distention and positive pitting bilateral lower extremity edema.   Na 134, K 3,8 CL 93, bicarbonate 27, glucose 97 bun 17 cr 1,20  BNP 1,964  High sensitive troponin 12 and 11  Wbc 8,4 hgb 14.2 plt 156  Toxicology positive for amphetamines.   Chest radiograph with cardiomegaly with mild hilar vascular congestion, small bilateral pleural effusions, implantable defibrillator in place.   EKG 99 bpm, normal axis, interventricular conduction delay, qtc 536, sinus rhythm with poor R to R wave progression, no significant ST segment or T wave changes.  Patient was placed on furosemide for diuresis.     Assessment and Plan: * Acute on chronic systolic CHF (congestive heart failure) (HCC) Echocardiogram with LV systolic function <20%, global hypokinesis, abnormal paradoxical septal motion, severe reduction in RV systolic function, RV with severe dilatation, moderate to severe TR, RVSP 39,2 mmHg.   Urine output is 2,200 ml  Systolic blood pressure 96 to 97 mmHg.  Volume status clinical has improved.  Plan to  hold on loop diuretic for now.  Add SGLT 2 inh  Not on spironolactone due to history of gynecomastia  Hold on carvedilol and ARB due to risk of hypotension Patient may benefit from digoxin  Patient has been deemed not candidate for advance therapies due to non compliance and alcohol abuse.   Coronary artery disease SP CABG on 11/2017  Contin with aspirin and statin therapy.  No chest pain.   Essential hypertension Patient hypotensive  Hold on antihypertensive medications.   Mixed hyperlipidemia Continue with statin therapy.   Seizures (HCC) Continue with keppra.   Alcohol abuse No signs of acute withdrawal Will hold on IV lorazepam and will continue with oral lorazepam as needed.  Continue neuro checks per unit protocol.   Hypokalemia Renal function with serum cr at 1,15 with K at 2,9 and serum bicarbonate at 28. Plan to continue K correction with Kcl, patient had 20 meq in the ED, will add 80 more in 2 divided doses. Follow up renal function in am.         Subjective: Patient with no chest pain, dyspnea has improved, no nausea or vomiting, no anxiety or tremors   Physical Exam: Vitals:   09/30/22 1216 09/30/22 1246 09/30/22 1550 09/30/22 1553  BP: (!) 122/97 113/82 94/70 96/72   Pulse: 91 88 84 86  Resp: 14 18 18 18   Temp:  98.1 F (36.7 C)  98.3 F (36.8 C)  TempSrc:  Oral  Oral  SpO2: 99% 99% 92% 96%  Weight:  62.7 kg    Height:  5\' 10"  (1.778 m)  Neurology somnolent but easy to arouse, able to answer simple questions and follow commands ENT with mild pallor Cardiovascular with S1 and S2 present and rhythmic, positive systolic murmurs at the lower left sternal border, no gallops or rubs Respiratory with no rales or wheezing Abdomen with no distention  Mild trace lower extremity edema  Data Reviewed:    Family Communication: no family at the bedside   Disposition: Status is: Inpatient Remains inpatient appropriate because: heart failure    Planned Discharge Destination: Home  Author: Tawni Millers, MD 09/30/2022 4:15 PM  For on call review [email or web address].

## 2022-09-30 NOTE — Progress Notes (Signed)
Soft BP noted in Rt and LUE. BP = Rt Arm 94/70 (78), Left Arm 96/72 (82) . Dr Cathlean Sauer notified. Order received to hold IV Lasix for now. Pt is A & O x 4, however has been somewhat drowsy since arrival from ED. No c/o expressed. Instructed to call prn OOB etc. Verbalized understanding. Call bell in reach.

## 2022-09-30 NOTE — Assessment & Plan Note (Addendum)
Improved blood pressure, resumed carvedilol at a low dose.

## 2022-09-30 NOTE — Progress Notes (Signed)
  Echocardiogram 2D Echocardiogram has been performed.  [name] 09/30/2022, 2:19 PM

## 2022-09-30 NOTE — Assessment & Plan Note (Signed)
Continue with keppra.  

## 2022-09-30 NOTE — H&P (Signed)
History and Physical    [name] [id number] DOB: 1961/07/31 DOA: 09/29/2022  PCP: Gildardo Pounds, NP   Patient coming from: Home   Chief Complaint: SOB   HPI: [name] is a 61 y.o. 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  Patient reports progressive exertional dyspnea and fatigue, now even dyspneic at rest at times.  He has been experiencing chest tightness recently.  He drinks a sixpack of beer daily.  He was seen in the [facility] today where he was noted to be hypervolemic with blood pressure of 89/60 and mild tachycardia.  He was directed to the ED for inpatient management of suspected end-stage heart failure.  ED Course: Upon arrival to the ED, patient is found to be afebrile and saturating mid 90s on room air with stable blood pressure.  EKG features sinus rhythm with fusion complexes and LBBB.  Chest x-ray negative for acute cardiopulmonary disease.  Chemistry panel with slight hyponatremia.  CBC unremarkable.  Troponin normal x2.  BNP increased to 1965.  Patient was given aspirin and IV Lasix in the ED.  Review of Systems:  All other systems reviewed and apart from HPI, are negative.  Past Medical History:  Diagnosis Date   AICD (automatic cardioverter/defibrillator) present    Alcohol abuse 05/18/2020   Arthritis    "hands; maybe my toes" (12/20/2017)   CAD (coronary artery disease)    Cardiac arrest ([place]) 11/29/2017   hx VF arrest/notes 12/20/2017   CHF (congestive heart failure) ([place])    Chronic systolic heart failure ([place]) 08/25/2019   Coronary artery disease    Coronary artery disease involving native coronary artery of native heart without angina pectoris 12/07/2017   Essential hypertension 05/18/2020   GERD (gastroesophageal reflux disease)    H/O ETOH abuse     /notes 12/20/2017   High cholesterol    Hypertension    Ischemic cardiomyopathy    a. 08/2019 s/p BSX D232 single lead AICD.   Mixed hyperlipidemia 05/18/2020   Seizure ([place]) 10/21/2019   Seizures ([place]) ~ 2016; ~ 2017   "I've had a couple; I was at work" (12/20/2017)   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, 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 ([place]) 12/30/2019   Syncope and collapse    "last few days" (12/20/2017)    Past Surgical History:  Procedure Laterality Date   CARDIAC CATHETERIZATION     CORONARY ARTERY BYPASS GRAFT N/A 12/07/2017   Procedure: CORONARY ARTERY BYPASS GRAFTING (CABG) times four using left internal mammary artery and right saphenous vein using endoscope for harvest.;  Surgeon: Ivin Poot, MD;  Location: [place];  Service: Open Heart Surgery;  Laterality: N/A;   I & D EXTREMITY Left 05/20/2020   Procedure: IRRIGATION AND DEBRIDEMENT OF WRIST;  Surgeon: Verner Mould, MD;  Location: [place];  Service: Orthopedics;  Laterality: Left;   IABP INSERTION N/A 11/29/2017   Procedure: IABP Insertion;  Surgeon: Nelva Bush, MD;  Location: [place] [facility];  Service: Cardiovascular;  Laterality: N/A;   IABP INSERTION N/A 12/03/2017   Procedure: IABP INSERTION;  Surgeon: Jolaine Artist, MD;  Location: [place] CV LAB;  Service: Cardiovascular;  Laterality: N/A;   ICD IMPLANT N/A 08/29/2019   Procedure: ICD IMPLANT;  Surgeon: Evans Lance, MD;  [facility] INV[facility]IVE [facility];  Service: Cardiovascular;  Laterality: N/A;   LEFT HEART CATH AND CORONARY ANGIOGRAPHY N/A 11/29/2017   Procedure: LEFT HEART CATH AND CORONARY ANGIOGRAPHY;  Surgeon: Nelva Bush, MD;  Location: [place] CV LAB;  Service: Cardiovascular;  Laterality: N/A;   RIGHT HEART CATH N/A 11/29/2017   Procedure: RIGHT HEART CATH;  Surgeon: Jolaine Artist, MD;  Location: [place] CV LAB;  Service: Cardiovascular;  Laterality: N/A;   TEE WITHOUT CARDIOVERSION  N/A 12/07/2017   Procedure: TRANSESOPHAGEAL ECHOCARDIOGRAM (TEE);  Surgeon: Prescott Gum, Collier Salina, MD;  Location: [place];  Service: Open Heart Surgery;  Laterality: N/A;   TONSILLECTOMY AND ADENOIDECTOMY  ~ 1972    Social History:   reports that he has never smoked. He has never used smokeless tobacco. He reports current alcohol use of about 14.0 standard drinks of alcohol per week. He reports that he does not use drugs.  Allergies  Allergen Reactions   Spironolactone Other (See Comments)    Gynecomastia     Family History  Problem Relation Age of Onset   Heart attack Other    Diabetes Neg Hx      Prior to Admission medications   Medication Sig Start Date End Date Taking? Authorizing Provider  furosemide (LASIX) 20 MG tablet Take 1 tablet (20 mg total) by mouth daily. 09/11/22   Joette Catching, PA-C  levETIRAcetam (KEPPRA) 500 MG tablet TAKE 1 TABLET BY MOUTH 2 TIMES DAILY 01/04/21   Melvenia Beam, MD  potassium chloride SA (KLOR-CON M) 20 MEQ tablet Take 1 tablet (20 mEq total) by mouth daily. 09/11/22   Joette Catching, PA-C    Physical Exam: Vitals:   09/29/22 2031 09/29/22 2330 09/30/22 0019 09/30/22 0030  BP: (!) 130/97 (!) 121/95  112/89  Pulse: (!) 103 92  91  Resp: 14 (!) 28  17  Temp: 99.1 F (37.3 C)  97.8 F (36.6 C)   TempSrc: Oral  Oral   SpO2: 98% 92%  98%  Weight:      Height:        Constitutional: NAD, no pallor or diaphoresis   Eyes: PERTLA, lids and conjunctivae normal ENMT: Mucous membranes are moist. Posterior pharynx clear of any exudate or lesions.   Neck: supple, no masses  Respiratory: no wheezing, no crackles. No accessory muscle use.  Cardiovascular: S1 & S2 heard, regular rate and rhythm. Pitting edema to lower legs b/l. JVD noted. Abdomen: No distension, no tenderness, soft. Bowel sounds active.  Musculoskeletal: no clubbing / cyanosis. No joint deformity upper and lower extremities.   Skin: no significant rashes, lesions, ulcers.  Warm, dry, well-perfused. Neurologic: CN 2-12 grossly intact. Moving all extremities. Alert and oriented.  Psychiatric: Calm. Cooperative.    Labs and Imaging on Admission: I have personally reviewed following labs and imaging studies  CBC: Recent Labs  Lab 09/29/22 1325  WBC 8.4  HGB 14.2  HCT 42.7  MCV 97.0  PLT [id number]   Basic Metabolic Panel: Recent Labs  Lab 09/29/22 1325  NA 134*  K 3.8  CL 93*  CO2 27  GLUCOSE 97  BUN 17  CREATININE 1.20  CALCIUM 9.4   GFR: Estimated Creatinine Clearance: 62.1 mL/min (by C-G formula based on SCr of 1.2 mg/dL). Liver Function Tests: No results for input(s): "AST", "ALT", "ALKPHOS", "BILITOT", "PROT", "ALBUMIN" in the last 168 hours. No results for input(s): "LIPASE", "AMYLASE" in the last 168 hours. No results for input(s): "AMMONIA" in the last 168 hours. Coagulation  Profile: No results for input(s): "INR", "PROTIME" in the last 168 hours. Cardiac Enzymes: No results for input(s): "CKTOTAL", "CKMB", "CKMBINDEX", "TROPONINI" in the last 168 hours. BNP (last 3 results) No results for input(s): "PROBNP" in the last 8760 hours. HbA1C: No results for input(s): "HGBA1C" in the last 72 hours. CBG: No results for input(s): "GLUCAP" in the last 168 hours. Lipid Profile: No results for input(s): "CHOL", "HDL", "LDLCALC", "TRIG", "CHOLHDL", "LDLDIRECT" in the last 72 hours. Thyroid Function Tests: No results for input(s): "TSH", "T4TOTAL", "FREET4", "T3FREE", "THYROIDAB" in the last 72 hours. Anemia Panel: No results for input(s): "VITAMINB12", "FOLATE", "FERRITIN", "TIBC", "IRON", "RETICCTPCT" in the last 72 hours. Urine analysis:    Component Value Date/Time   COLORURINE COLORLESS (A) 01/08/2020 1922   APPEARANCEUR CLEAR 01/08/2020 1922   LABSPEC 1.002 (L) 01/08/2020 1922   PHURINE 7.0 01/08/2020 1922   GLUCOSEU NEGATIVE 01/08/2020 [place] NEGATIVE 01/08/2020 [place] NEGATIVE 01/08/2020 [place] NEGATIVE  01/08/2020 [place] NEGATIVE 01/08/2020 1922   NITRITE NEGATIVE 01/08/2020 1922   LEUKOCYTESUR NEGATIVE 01/08/2020 1922   Sepsis Labs: @LABRCNTIP (procalcitonin:4,lacticidven:4) )No results found for this or any previous visit (from the past 240 hour(s)).   Radiological Exams on Admission: DG Chest 2 View  Result Date: 09/29/2022 CLINICAL DATA:  Shortness of breath for 1 week EXAM: CHEST - 2 VIEW COMPARISON:  Chest x-ray July 23, 2022, chest CT August 14, 2020 FINDINGS: Intact left chest wall AICD. Stable cardiomediastinal contours status post median sternotomy and CABG. Unchanged elevation of the left hemidiaphragm with left basilar atelectasis/scarring. No new focal pulmonary opacity. No large pneumothorax or pleural effusion. No acute osseous abnormality. The visualized upper abdomen is unremarkable. IMPRESSION: No acute cardiopulmonary abnormality. Electronically Signed   By: Beryle Flock M.D.   On: 09/29/2022 13:51    EKG: Independently reviewed. Sinus rhythm with fusion complexes, LBBB, QTc 544 ms.   Assessment/Plan  1. Acute on chronic combined CHF  - Presents with DOE and fatigue, sent from Advanced HF clinic for inpatient mgmt of suspected end-stage HF  - BP has been stable in ED and he was given 40 mg IV Lasix  - Continue diuresis with IV Lasix, monitor wt and I/Os, update echocardiogram   2. CAD  - S/p CABG in 2018  - Chest tightness likely from acute CHF, troponin normal x2 in ED  - Resume ASA and statin    3. Alcohol abuse  - Does not appear to be withdrawing in ED   - Monitor with CIWA, use Ativan as needed, supplement B-vitamins    4. Seizures  - Continue Keppra    DVT prophylaxis: Lovenox  Code Status: Full  Level of Care: Level of care: Telemetry Cardiac Family Communication: None present  Disposition Plan:  Patient is from: home  Anticipated d/c is to: TBD Anticipated d/c date is: 10/03/22  Patient currently: Pending improvement in volume  status, stable labs  Consults called: none  Admission status: Inpatient     Vianne Bulls, MD [facility]  09/30/2022, 1:00 AM

## 2022-09-30 NOTE — Assessment & Plan Note (Signed)
Echocardiogram with LV systolic function <41%, global hypokinesis, abnormal paradoxical septal motion, severe reduction in RV systolic function, RV with severe dilatation, moderate to severe TR, RVSP 39,2 mmHg.   Urine output is 6,606 ml  Systolic blood pressure 96 to 97 mmHg.  Volume status clinical has improved.  Plan to hold on loop diuretic for now.  Add SGLT 2 inh  Not on spironolactone due to history of gynecomastia  Hold on carvedilol and ARB due to risk of hypotension Patient may benefit from digoxin  Patient has been deemed not candidate for advance therapies due to non compliance and alcohol abuse.

## 2022-09-30 NOTE — ED Notes (Signed)
Pt sleeping at this time.

## 2022-09-30 NOTE — Hospital Course (Signed)
[name] was admitted to the hospital with the working diagnosis of decompensated heart failure.   61 yo male with the past medical history of coronary artery disease sp CABG, heart failure, seizures and alcohol abuse who presented with dyspnea and fatigue. Patient had progressive dyspnea on exertion, getting symptomatic with minimal efforts. He was evaluated the heart failure clinical and was noted to be hypotensive and hypovolemic, and he was referred to the ED for further evaluation. On his initial physical examination in the ED his blood pressure was 130/97, HR 103, RR 14 and 02 saturation 98%, lungs with no wheezing, or rhonchi, heart with S1 and S2 present and rhythmic with no gallops, positive JVD, no abdominal distention and positive pitting bilateral lower extremity edema.   Na 134, K 3,8 CL 93, bicarbonate 27, glucose 97 bun 17 cr 1,20  BNP 1,964  High sensitive troponin 12 and 11  Wbc 8,4 hgb 14.2 plt 156  Toxicology positive for amphetamines.   Chest radiograph with cardiomegaly with mild hilar vascular congestion, small bilateral pleural effusions, implantable defibrillator in place.   EKG 99 bpm, normal axis, interventricular conduction delay, qtc 536, sinus rhythm with poor R to R wave progression, no significant ST segment or T wave changes.  Patient was placed on furosemide for diuresis.   10/08 added digoxin  10/09 resumed low dose carvedilol.   Patient with improvement in volume status and hemodynamics, plan for follow up as outpatient with heart failure clinic.

## 2022-09-30 NOTE — Assessment & Plan Note (Signed)
Continue with statin therapy.  ?

## 2022-09-30 NOTE — ED Notes (Signed)
Received verbal report from Ashley J RN at this time 

## 2022-09-30 NOTE — Assessment & Plan Note (Signed)
Renal function with serum cr at 1,15 with K at 2,9 and serum bicarbonate at 28. Plan to continue K correction with Kcl, patient had 20 meq in the ED, will add 80 more in 2 divided doses. Follow up renal function in am.

## 2022-09-30 NOTE — Plan of Care (Signed)
  Problem: Activity: Goal: Capacity to carry out activities will improve Outcome: Progressing   Problem: Cardiac: Goal: Ability to achieve and maintain adequate cardiopulmonary perfusion will improve Outcome: Progressing   Problem: Health Behavior/Discharge Planning: Goal: Ability to manage health-related needs will improve Outcome: Progressing   Problem: Nutrition: Goal: Adequate nutrition will be maintained Outcome: Progressing

## 2022-09-30 NOTE — ED Notes (Signed)
Moved to 39 at this time

## 2022-09-30 NOTE — Assessment & Plan Note (Signed)
No signs of acute withdrawal. Alcohol cessation counseling.

## 2022-10-01 DIAGNOSIS — I2510 Atherosclerotic heart disease of native coronary artery without angina pectoris: Secondary | ICD-10-CM | POA: Diagnosis not present

## 2022-10-01 DIAGNOSIS — I10 Essential (primary) hypertension: Secondary | ICD-10-CM | POA: Diagnosis not present

## 2022-10-01 DIAGNOSIS — E782 Mixed hyperlipidemia: Secondary | ICD-10-CM | POA: Diagnosis not present

## 2022-10-01 DIAGNOSIS — I5023 Acute on chronic systolic (congestive) heart failure: Secondary | ICD-10-CM | POA: Diagnosis not present

## 2022-10-01 LAB — BASIC METABOLIC PANEL
Anion gap: 10 (ref 5–15)
BUN: 18 mg/dL (ref 6–20)
CO2: 26 mmol/L (ref 22–32)
Calcium: 9.2 mg/dL (ref 8.9–10.3)
Chloride: 97 mmol/L — ABNORMAL LOW (ref 98–111)
Creatinine, Ser: 1.23 mg/dL (ref 0.61–1.24)
GFR, Estimated: >60 mL/min (ref >60)
Glucose, Bld: 107 mg/dL — ABNORMAL HIGH (ref 70–99)
Potassium: 4.2 mmol/L (ref 3.5–5.1)
Sodium: 133 mmol/L — ABNORMAL LOW (ref 135–145)

## 2022-10-01 LAB — MAGNESIUM: Magnesium: 2 mg/dL (ref 1.7–2.4)

## 2022-10-01 MED ORDER — DIGOXIN 125 MCG PO TABS
0.1250 mg | ORAL_TABLET | Freq: Every day | ORAL | Status: DC
  Administered 2022-10-01 – 2022-10-03 (×3): 0.125 mg via ORAL
  Filled 2022-10-01 (×3): qty 1

## 2022-10-01 NOTE — Progress Notes (Signed)
Progress Note   Patient: [name] FGH:[id number] DOB: 11/18/1961 DOA: 09/29/2022     1 DOS: the patient was seen and examined on 10/01/2022   Brief hospital course: [name] was admitted to the hospital with the working diagnosis of decompensated heart failure.   61 yo male with the past medical history of coronary artery disease sp CABG, heart failure, seizures and alcohol abuse who presented with dyspnea and fatigue. Patient had progressive dyspnea on exertion, getting symptomatic with minimal efforts. He was evaluated the heart failure clinical and was noted to be hypotensive and hypovolemic, and he was referred to the ED for further evaluation. On his initial physical examination in the ED his blood pressure was 130/97, HR 103, RR 14 and 02 saturation 98%, lungs with no wheezing, or rhonchi, heart with S1 and S2 present and rhythmic with no gallops, positive JVD, no abdominal distention and positive pitting bilateral lower extremity edema.   Na 134, K 3,8 CL 93, bicarbonate 27, glucose 97 bun 17 cr 1,20  BNP 1,964  High sensitive troponin 12 and 11  Wbc 8,4 hgb 14.2 plt 156  Toxicology positive for amphetamines.   Chest radiograph with cardiomegaly with mild hilar vascular congestion, small bilateral pleural effusions, implantable defibrillator in place.   EKG 99 bpm, normal axis, interventricular conduction delay, qtc 536, sinus rhythm with poor R to R wave progression, no significant ST segment or T wave changes.  Patient was placed on furosemide for diuresis.   10/08 added digoxin    Assessment and Plan: * Acute on chronic systolic CHF (congestive heart failure) (HCC) Echocardiogram with LV systolic function <67%, global hypokinesis, abnormal paradoxical septal motion, severe reduction in RV systolic function, RV with severe dilatation, moderate to severe TR, RVSP 39,2 mmHg.   Urine output is 8,938 ml  Systolic blood pressure 101 to 127 mmHg with elevated diastolic at  92 and 751.   Continue with SGLT 2 inh and digoxin.   Not on spironolactone due to history of gynecomastia   If blood pressure continue to be stable will consider low dose ARB Continue to hold on carvedilol for now.   Patient has been deemed not candidate for advance therapies due to non compliance and alcohol abuse.   Coronary artery disease SP CABG on 11/2017  Contin with aspirin and statin therapy.  No chest pain.   Essential hypertension Blood pressure has improved with systolic over 025 mmHg. Continue to hold on carvedilol. Continue close blood pressure monitoring.   Mixed hyperlipidemia Continue with statin therapy.   Seizures ([place]) Continue with keppra.   Alcohol abuse No signs of acute withdrawal Continue with as needed lorazepam po.    Hypokalemia Hyponatremia.   Renal function with serum cr at 1,23 with K at 4,2 and serum bicarbonate at 26. Na 133.  Plan to continue close follow up on renal function and electrolytes, avoid hypotension or nephrotoxic medications.         Subjective: Patient is feeling better, no chest pain and dyspnea has improved.   Physical Exam: Vitals:   09/30/22 2058 10/01/22 0025 10/01/22 0518 10/01/22 0800  BP: 109/81 (!) 118/92 (!) 127/100   Pulse: 91 96 (!) 103 99  Resp: 16 16 16    Temp: 97.8 F (36.6 C) 98.1 F (36.7 C) 97.9 F (36.6 C)   TempSrc: Oral Oral Oral   SpO2: 99% 100% 99% 98%  Weight:   63.2 kg   Height:       Neurology  awake and alert ENT with mild pallor Cardiovascular with S1 and S2 present and rhythmic with no gallops, rubs or murmurs Respiratory with no rales or wheezing Abdomen with no distention  No lower extremity edema  Data Reviewed:    Family Communication: no family at the bedside   Disposition: Status is: Inpatient Remains inpatient appropriate because: heart failure   Planned Discharge Destination: Home    Author: Tawni Millers, MD 10/01/2022 2:43 PM  For on call  review [email or web address].

## 2022-10-01 NOTE — Progress Notes (Signed)
Mobility Specialist - Progress Note   10/01/22 1609  Mobility  Activity Ambulated with assistance in hallway  Activity Response Tolerated well  Distance Ambulated (ft) 400 ft  $Mobility charge 1 Mobility  Level of Assistance Standby assist, set-up cues, supervision of patient - no hands on  Assistive Device None  Mobility Referral Yes   Pt received in bed and agreeable. No complaints throughout. Pt returned to EOB with all needs met  Franki Monte  Mobility Specialist

## 2022-10-02 ENCOUNTER — Telehealth (HOSPITAL_COMMUNITY): Payer: Self-pay | Admitting: Pharmacy Technician

## 2022-10-02 ENCOUNTER — Other Ambulatory Visit (HOSPITAL_COMMUNITY): Payer: Self-pay

## 2022-10-02 DIAGNOSIS — R569 Unspecified convulsions: Secondary | ICD-10-CM | POA: Diagnosis not present

## 2022-10-02 DIAGNOSIS — I5023 Acute on chronic systolic (congestive) heart failure: Secondary | ICD-10-CM | POA: Diagnosis not present

## 2022-10-02 DIAGNOSIS — E782 Mixed hyperlipidemia: Secondary | ICD-10-CM | POA: Diagnosis not present

## 2022-10-02 DIAGNOSIS — I2510 Atherosclerotic heart disease of native coronary artery without angina pectoris: Secondary | ICD-10-CM | POA: Diagnosis not present

## 2022-10-02 LAB — BASIC METABOLIC PANEL
Anion gap: 9 (ref 5–15)
BUN: 18 mg/dL (ref 6–20)
CO2: 27 mmol/L (ref 22–32)
Calcium: 9.4 mg/dL (ref 8.9–10.3)
Chloride: 99 mmol/L (ref 98–111)
Creatinine, Ser: 1.33 mg/dL — ABNORMAL HIGH (ref 0.61–1.24)
GFR, Estimated: >60 mL/min (ref >60)
Glucose, Bld: 102 mg/dL — ABNORMAL HIGH (ref 70–99)
Potassium: 4.2 mmol/L (ref 3.5–5.1)
Sodium: 135 mmol/L (ref 135–145)

## 2022-10-02 MED ORDER — POTASSIUM CHLORIDE CRYS ER 20 MEQ PO TBCR
20.0000 meq | EXTENDED_RELEASE_TABLET | Freq: Once | ORAL | Status: AC
  Administered 2022-10-02: 20 meq via ORAL
  Filled 2022-10-02: qty 1

## 2022-10-02 MED ORDER — CARVEDILOL 3.125 MG PO TABS
3.1250 mg | ORAL_TABLET | Freq: Two times a day (BID) | ORAL | Status: DC
  Administered 2022-10-02 – 2022-10-03 (×2): 3.125 mg via ORAL
  Filled 2022-10-02 (×2): qty 1

## 2022-10-02 MED ORDER — FUROSEMIDE 10 MG/ML IJ SOLN
40.0000 mg | Freq: Once | INTRAMUSCULAR | Status: AC
  Administered 2022-10-02: 40 mg via INTRAVENOUS
  Filled 2022-10-02: qty 4

## 2022-10-02 NOTE — TOC Initial Note (Addendum)
Transition of Care [facility]) - Initial/Assessment Note    Patient Details  Name: [name] MRN: [id number] Date of Birth: 1961-03-28  Transition of Care [facility]) CM/SW Contact:    Elliot Cousin, RN Phone Number: [phone] 10/02/2022, 4:51 PM  Clinical Narrative:                 HF TOC CM spoke to pt at bedside. States he lives with his girlfriend and assist her in the home. States he uses Medicaid transportation to appts. PCP's office, Bertram [place] NP will call arrange appt with pt. Pt does not have a scale at home. Will request medications come up from [facility] pharmacy at dc.   Expected Discharge Plan: Home/Self Care Barriers to Discharge: Continued Medical Work up   Patient Goals and CMS Choice  Expected Discharge Plan and Services Expected Discharge Plan: Home/Self Care   Discharge Planning Services: CM Consult   Living arrangements for the past 2 months: Single Family Home   Prior Living Arrangements/Services Living arrangements for the past 2 months: Single Family Home Lives with:: Roommate Patient language and need for interpreter reviewed:: Yes Do you feel safe going back to the place where you live?: Yes      Need for Family Participation in Patient Care: No (Comment) Care giver support system in place?: No (comment)   Criminal Activity/Legal Involvement Pertinent to Current Situation/Hospitalization: No - Comment as needed  Activities of Daily Living Home Assistive Devices/Equipment: None ADL Screening (condition at time of admission) Patient's cognitive ability adequate to safely complete daily activities?: Yes Is the patient deaf or have difficulty hearing?: No Does the patient have difficulty seeing, even when wearing glasses/contacts?: No Does the patient have difficulty concentrating, remembering, or making decisions?: No Patient able to express need for assistance with ADLs?: Yes Does the patient have difficulty dressing or bathing?:  No Independently performs ADLs?: Yes (appropriate for developmental age) Does the patient have difficulty walking or climbing stairs?: No Weakness of Legs: None Weakness of Arms/Hands: None  Permission Sought/Granted Permission sought to share information with : Case Manager, Family Supports, PCP Permission granted to share information with : Yes, Verbal Permission Granted  Share Information with NAME: Arkel Cartwright     Permission granted to share info w Relationship: sister  Permission granted to share info w Contact Information: [phone]  Emotional Assessment Appearance:: Appears stated age Attitude/Demeanor/Rapport: Engaged Affect (typically observed): Accepting Orientation: : Oriented to Self, Oriented to Place, Oriented to  Time, Oriented to Situation Alcohol / Substance Use: Alcohol Use Psych Involvement: No (comment)  Admission diagnosis:  Acute on chronic systolic congestive heart failure (HCC) [I50.23] Acute on chronic systolic CHF (congestive heart failure) (HCC) [I50.23] Patient Active Problem List   Diagnosis Date Noted   Acute on chronic systolic CHF (congestive heart failure) (HCC) 09/30/2022   Hypokalemia 09/30/2022   Coronary artery disease    Closed fracture of right upper limb 01/27/2021   Pneumonia due to COVID-19 virus 08/14/2020   Hyponatremia 08/14/2020   Elevated AST (SGOT) 08/14/2020   Prolonged QT interval 08/14/2020   Cardiac arrest, cause unspecified (HCC) 07/06/2020   Left arm cellulitis 05/19/2020   Essential hypertension 05/18/2020   Mixed hyperlipidemia 05/18/2020   Alcohol abuse 05/18/2020   Cellulitis of left upper extremity 05/18/2020   Lactic acidosis 05/18/2020   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, 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) 12/30/2019   Seizures (HCC) 10/21/2019   ICD (implantable cardioverter-defibrillator) in place  72/62/0355   Chronic systolic heart failure ([place]) 08/25/2019   S/P  CABG x 4 12/12/2017   Coronary artery disease involving native coronary artery of native heart without angina pectoris 12/07/2017   PCP:  Gildardo Pounds, NP Pharmacy:   [id number] - [name], [place] - [address] [place] [address] Phone: [phone] Fax: [place], [place] - [address] [place] [id number] Phone: [phone] Fax: [phone]     Social Determinants of Health (SDOH) Interventions Food Insecurity Interventions: Intervention Not Indicated Housing Interventions: Intervention Not Indicated Transportation Interventions: Intervention Not Indicated Utilities Interventions: Intervention Not Indicated Financial Strain Interventions: Intervention Not Indicated  Readmission Risk Interventions     No data to display

## 2022-10-02 NOTE — Progress Notes (Signed)
Heart Failure Navigator Progress Note  Assessed for Heart & Vascular TOC clinic readiness.  Prior to hospitalization care established with AHF clinic and Dr. Haroldine Laws. Last seen in AHF clinic 10/6 APP.    HF Navigation team will sign-off.    Pricilla Holm, MSN, RN Heart Failure Nurse Navigator

## 2022-10-02 NOTE — Telephone Encounter (Signed)
Patient Advocate Encounter   Received notification that prior authorization for Jardiance 10MG  tablets is required.   PA submitted on 10/02/2022 Key [id number] Status is pending       Lyndel Safe, [place] Patient Advocate Specialist [place] Patient Advocate Team Direct Number: [phone]  Fax: [phone]

## 2022-10-02 NOTE — Consult Note (Signed)
Advanced Heart Failure Team Consult Note   Primary Physician: Claiborne Rigg, NP PCP-Cardiologist:  None  Reason for Consultation: A/C HFrEF  HPI:    [name] is seen today for evaluation of A/C HFrEF at the request of Dr Ella Jubilee.   [name] is a 61 year old with a h/o ETOH, VF, CAD, CABG 2018, seizure, [employer] ICD, HFrEf, and medication noncomplaince.    Admitted 2018 after VF arrest. Cath with severe 3 vessel disease. IABP placed with stabilization. He then underwent CABG x3.   In 2020 he was seen in the HF clinic and had witnessed seizure with loss of pulse. Had CPR with ROSC. Started on Keppra. ICD did not show arrhythmias.   He has been followed for several years in the HF clinic. Ongoing issues with substance/ETOH abuse and medications. Multiple SDOH needs. Previously followed by HF Paramedicine.  He was discharged from HF Paramedicine because  they could not contact him. Multiple no show and cancellations over the last year.  He showed up 09/08/22 and had been off all HF meds. Given IV lasix in the clinic. Script was sent for po lasix. He returned to clinic on 09/11/22 and had never picked up lasix. Volume overloaded. Instructed to pick up lasix.   He was seen in the HF clinic on 09/29/22 and had marked volume overload.  Reds clip 48%. Ongoing ETOH abuse. Sent to ED and admitted with A/C HFrEF. [id number]. HS Trop 12>11. UDS + amphetamines. CXR with vascular congestion. Echo EF < 20%, RV severely reduced.  Overall he has received 80 mg IV lasix. Yesterday he was not given diuretics. Weight down 7 pounds.   Todays Reds Clip 39%.  Feeling better.   09/2022 Echo LVEF < 20%. RV severely reduced.. Grade II DD. LA moderately dilated. RA mildly dilated. TR mod-severe 2021 Echo LVEF 30-35% RV moderately reduced. Grade I DD 2020 Echo LVEF 30-35% RV normal   Review of Systems: [y] = yes, [ ]  = no   General: Weight gain [ ] ; Weight loss [ ] ; Anorexia [ ] ; Fatigue [  Y]; Fever [ ] ; Chills [ ] ; Weakness [ Y]  Cardiac: Chest pain/pressure [ ] ; Resting SOB [ ] ; Exertional SOB [ Y]; Orthopnea [ Y]; Pedal Edema [ ] ; Palpitations [ ] ; Syncope [ ] ; Presyncope [ ] ; Paroxysmal nocturnal dyspnea[ ]   Pulmonary: Cough [ ] ; Wheezing[ ] ; Hemoptysis[ ] ; Sputum [ ] ; Snoring [ ]   GI: Vomiting[ ] ; Dysphagia[ ] ; Melena[ ] ; Hematochezia [ ] ; Heartburn[ ] ; Abdominal pain [ ] ; Constipation [ ] ; Diarrhea [ ] ; BRBPR [ ]   GU: Hematuria[ ] ; Dysuria [ ] ; Nocturia[ ]   Vascular: Pain in legs with walking [ ] ; Pain in feet with lying flat [ ] ; Non-healing sores [ ] ; Stroke [ ] ; TIA [ ] ; Slurred speech [ ] ;  Neuro: Headaches[ ] ; Vertigo[ ] ; Seizures[ ] ; Paresthesias[ ] ;Blurred vision [ ] ; Diplopia [ ] ; Vision changes [ ]   Ortho/Skin: Arthritis [ ] ; Joint pain [ ] ; Muscle pain [ ] ; Joint swelling [ ] ; Back Pain [ ] ; Rash [ ]   Psych: Depression[ ] ; Anxiety[ ]   Heme: Bleeding problems [ ] ; Clotting disorders [ ] ; Anemia [ ]   Endocrine: Diabetes [ ] ; Thyroid dysfunction[ ]   Home Medications Prior to Admission medications   Medication Sig Start Date End Date Taking? Authorizing Provider  carvedilol (COREG) 6.25 MG tablet Take 6.25 mg by mouth daily.   Yes [provider]  furosemide (LASIX) 40 MG  tablet Take 40 mg by mouth daily.   Yes [provider]  levETIRAcetam (KEPPRA) 500 MG tablet TAKE 1 TABLET BY MOUTH 2 TIMES DAILY Patient taking differently: Take 500 mg by mouth daily. 01/04/21  Yes Melvenia Beam, MD  potassium chloride SA (KLOR-CON M) 20 MEQ tablet Take 1 tablet (20 mEq total) by mouth daily. 09/11/22  Yes Joette Catching, PA-C  furosemide (LASIX) 20 MG tablet Take 1 tablet (20 mg total) by mouth daily. Patient not taking: Reported on 09/30/2022 09/11/22   Joette Catching, PA-C    Past Medical History: Past Medical History:  Diagnosis Date   AICD (automatic cardioverter/defibrillator) present    Alcohol abuse 05/18/2020   Arthritis    "hands;  maybe my toes" (12/20/2017)   CAD (coronary artery disease)    Cardiac arrest ([place]) 11/29/2017   hx VF arrest/notes 12/20/2017   CHF (congestive heart failure) ([place])    Chronic systolic heart failure ([place]) 08/25/2019   Coronary artery disease    Coronary artery disease involving native coronary artery of native heart without angina pectoris 12/07/2017   Essential hypertension 05/18/2020   GERD (gastroesophageal reflux disease)    H/O ETOH abuse    /notes 12/20/2017   High cholesterol    Hypertension    Ischemic cardiomyopathy    a. 08/2019 s/p BSX D232 single lead AICD.   Mixed hyperlipidemia 05/18/2020   Seizure ([place]) 10/21/2019   Seizures ([place]) ~ 2016; ~ 2017   "I've had a couple; I was at work" (12/20/2017)   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, 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 ([place]) 12/30/2019   Syncope and collapse    "last few days" (12/20/2017)    Past Surgical History: Past Surgical History:  Procedure Laterality Date   CARDIAC CATHETERIZATION     CORONARY ARTERY BYPASS GRAFT N/A 12/07/2017   Procedure: CORONARY ARTERY BYPASS GRAFTING (CABG) times four using left internal mammary artery and right saphenous vein using endoscope for harvest.;  Surgeon: Ivin Poot, MD;  Location: [facility];  Service: Open Heart Surgery;  Laterality: N/A;   I & D EXTREMITY Left 05/20/2020   Procedure: IRRIGATION AND DEBRIDEMENT OF WRIST;  Surgeon: Verner Mould, MD;  Location: [facility];  Service: Orthopedics;  Laterality: Left;   IABP INSERTION N/A 11/29/2017   Procedure: IABP Insertion;  Surgeon: Nelva Bush, MD;  Location: [facility];  Service: Cardiovascular;  Laterality: N/A;   IABP INSERTION N/A 12/03/2017   Procedure: IABP INSERTION;  Surgeon: Jolaine Artist, MD;  Location: [facility];  Service: Cardiovascular;  Laterality: N/A;   ICD IMPLANT N/A 08/29/2019   Procedure: ICD IMPLANT;  Surgeon: Evans Lance, MD;  Location: [facility];  Service: Cardiovascular;  Laterality: N/A;   LEFT HEART CATH AND CORONARY ANGIOGRAPHY N/A 11/29/2017   Procedure: LEFT HEART CATH AND CORONARY ANGIOGRAPHY;  Surgeon: Nelva Bush, MD;  Location: [facility];  Service: Cardiovascular;  Laterality: N/A;   RIGHT HEART CATH N/A 11/29/2017   Procedure: RIGHT HEART CATH;  Surgeon: Jolaine Artist, MD;  Location: [facility];  Service: Cardiovascular;  Laterality: N/A;   TEE WITHOUT CARDIOVERSION N/A 12/07/2017   Procedure: TRANSESOPHAGEAL ECHOCARDIOGRAM (TEE);  Surgeon: Prescott Gum, Collier Salina, MD;  Location: [facility];  Service: Open Heart Surgery;  Laterality: N/A;   TONSILLECTOMY AND ADENOIDECTOMY  ~ 1972    Family History: Family History  Problem Relation Age of Onset  Heart attack Other    Diabetes Neg Hx     Social History: Social History   Socioeconomic History   Marital status: Divorced    Spouse name: Not on file   Number of children: Not on file   Years of education: Not on file   Highest education level: Not on file  Occupational History   Not on file  Tobacco Use   Smoking status: Never   Smokeless tobacco: Never  Vaping Use   Vaping Use: Never used  Substance and Sexual Activity   Alcohol use: Yes    Alcohol/week: 14.0 standard drinks of alcohol    Types: 14 Cans of beer per week    Comment: patient endorses at least 1-2 beers daily (04/2020)   Drug use: No   Sexual activity: Not Currently  Other Topics Concern   Not on file  Social History Narrative   Lives at home with his brother who is crippled. He takes care of his brother.    Right handed    Social Determinants of Health   Financial Resource Strain: High Risk (02/23/2021)   Overall Financial Resource Strain (CARDIA)    Difficulty of Paying Living Expenses: Hard  Food Insecurity: Food Insecurity Present (09/08/2022)   Hunger Vital Sign    Worried About Running Out of Food in the Last Year: Often true    Ran Out of Food in the  Last Year: Often true  Transportation Needs: Unmet Transportation Needs (09/06/2022)   PRAPARE - Hydrologist (Medical): Yes    Lack of Transportation (Non-Medical): Yes  Physical Activity: Not on file  Stress: Not on file  Social Connections: Not on file    Allergies:  Allergies  Allergen Reactions   Aldactone [Spironolactone] Other (See Comments)    Gynecomastia     Objective:    Vital Signs:   Temp:  [97.7 F (36.5 C)-97.9 F (36.6 C)] 97.9 F (36.6 C) (10/09 0346) Pulse Rate:  [89-100] 89 (10/09 0346) Resp:  [16-18] 16 (10/09 0346) BP: (113-116)/(78-88) 113/78 (10/09 0346) SpO2:  [96 %-97 %] 97 % (10/09 0346) Weight:  [63.4 kg] 63.4 kg (10/09 0700) Last BM Date : 09/30/22  Weight change: Filed Weights   09/30/22 1246 10/01/22 0518 10/02/22 0700  Weight: 62.7 kg 63.2 kg 63.4 kg    Intake/Output:   Intake/Output Summary (Last 24 hours) at 10/02/2022 1013 Last data filed at 10/02/2022 0500 Gross per 24 hour  Intake 360 ml  Output 950 ml  Net -590 ml      Physical Exam    General:   No resp difficulty HEENT: normal Neck: supple. JVP 9-10 . Carotids 2+ bilat; no bruits. No lymphadenopathy or thyromegaly appreciated. Cor: PMI nondisplaced. Regular rate & rhythm. No rubs, gallops or murmurs. Lungs: clear Abdomen: soft, nontender, nondistended. No hepatosplenomegaly. No bruits or masses. Good bowel sounds. Extremities: no cyanosis, clubbing, rash, edema Neuro: alert & orientedx3, cranial nerves grossly intact. moves all 4 extremities w/o difficulty. Affect pleasant   Telemetry  SR 80s  EKG    Labs   Basic Metabolic Panel: Recent Labs  Lab 09/29/22 1325 09/30/22 0454 10/01/22 0159 10/02/22 0109  NA 134* 135 133* 135  K 3.8 2.9* 4.2 4.2  CL 93* 98 97* 99  CO2 27 28 26 27   GLUCOSE 97 132* 107* 102*  BUN 17 16 18 18   CREATININE 1.20 1.15 1.23 1.33*  CALCIUM 9.4 8.8* 9.2 9.4  MG  --  1.8 2.0  --     Liver Function  Tests: Recent Labs  Lab 09/29/22 2350 09/30/22 0454  AST 31 27  ALT 18 18  ALKPHOS 69 65  BILITOT 3.2* 2.9*  PROT 6.5 6.1*  ALBUMIN 3.5 3.3*   No results for input(s): "LIPASE", "AMYLASE" in the last 168 hours. Recent Labs  Lab 09/29/22 2352  AMMONIA 36*    CBC: Recent Labs  Lab 09/29/22 1325 09/30/22 0332  WBC 8.4 6.4  HGB 14.2 13.9  HCT 42.7 40.3  MCV 97.0 94.6  PLT 156 153    Cardiac Enzymes: No results for input(s): "CKTOTAL", "CKMB", "CKMBINDEX", "TROPONINI" in the last 168 hours.  BNP: BNP (last 3 results) Recent Labs    09/08/22 1052 09/11/22 1241 09/29/22 1325  BNP 1,094.7* 1,326.5* 1,964.6*    ProBNP (last 3 results) No results for input(s): "PROBNP" in the last 8760 hours.   CBG: No results for input(s): "GLUCAP" in the last 168 hours.  Coagulation Studies: No results for input(s): "LABPROT", "INR" in the last 72 hours.   Imaging   No results found.   Medications:     Current Medications:  aspirin EC  81 mg Oral Daily   atorvastatin  80 mg Oral Daily   digoxin  0.125 mg Oral Daily   empagliflozin  10 mg Oral Daily   levETIRAcetam  500 mg Oral BID   multivitamin with minerals  1 tablet Oral Daily   sodium chloride flush  3 mL Intravenous Q12H    Infusions:     Patient Profile   Jose Pennino is a 61 year old with a h/o ETOH, VF, CAD, CABG 2018, seizure, [employer] ICD, HFrEf, and medication noncomplaince.    Admitted A/C Biventricular HFrEF in the setting of medication noncompliance.   Assessment/Plan   1. A/C Biventricular HF, ICM  On Echo,  biventricular failure getting worse from LVEF 30-35% Mod RV--> LVEF < 20% severely reduced RV. Has [employer] ICD.  Admitted with volume overload in the setting of medication/diet noncompliance. BNP 1964. Reds Clip 48% on admit --> today 39%. - Overall diuresed with a total of 80 mg IV lasix. Will give one more dose of 40 mg IV lasix today then can transition to lasix 40  mg po daily. Aim to get as dry as possible.  - GDMT has been limited given compliance and hypotension. Will try to keep regimen simplified.  - Start coreg 3.125 mg twice a day.  - Add digoxin 0.125 mg daily.   - Continue jardiance 10 mg daily. Start PA for jardiance--> approved with $ 4.00 co-pay.   - Hold off in ARB and will likely need to consider as an outpatient.  - Hold Spiro with h/o gynecomastia. Could consider Inspra but I am concerned about his follow up.  - Renal function ok.  - Not a candidate for advanced therapies/transplant with ongoing ETOH/substance abuse and compliance.    2. CAD S/P CABG x 3 2018  -No chest pain - On aspirin, atorvastatin.   3. Seizure Disorder -On Keppra   4. ETOH Abuse -Discussed cessation   Difficult to manage with ongoing substance abuse and compliance issues. Will need to provide all meds at discharge. He will need close follow up in the HF clinic 10/09/22 at 3:30   Length of Stay: 2  Jabril Pursell, NP  10/02/2022, 10:13 AM  Advanced Heart Failure Team Pager [phone] (M-F; 7a - 5p)  Please contact [facility] for night-coverage after  hours (4p -7a ) and weekends on [email or web address]

## 2022-10-02 NOTE — Progress Notes (Signed)
Progress Note   Patient: [name] XHB:[id number] DOB: April 19, 1961 DOA: 09/29/2022     2 DOS: the patient was seen and examined on 10/02/2022   Brief hospital course: Mr. Rounds was admitted to the hospital with the working diagnosis of decompensated heart failure.   61 yo male with the past medical history of coronary artery disease sp CABG, heart failure, seizures and alcohol abuse who presented with dyspnea and fatigue. Patient had progressive dyspnea on exertion, getting symptomatic with minimal efforts. He was evaluated the heart failure clinical and was noted to be hypotensive and hypovolemic, and he was referred to the ED for further evaluation. On his initial physical examination in the ED his blood pressure was 130/97, HR 103, RR 14 and 02 saturation 98%, lungs with no wheezing, or rhonchi, heart with S1 and S2 present and rhythmic with no gallops, positive JVD, no abdominal distention and positive pitting bilateral lower extremity edema.   Na 134, K 3,8 CL 93, bicarbonate 27, glucose 97 bun 17 cr 1,20  BNP 1,964  High sensitive troponin 12 and 11  Wbc 8,4 hgb 14.2 plt 156  Toxicology positive for amphetamines.   Chest radiograph with cardiomegaly with mild hilar vascular congestion, small bilateral pleural effusions, implantable defibrillator in place.   EKG 99 bpm, normal axis, interventricular conduction delay, qtc 536, sinus rhythm with poor R to R wave progression, no significant ST segment or T wave changes.  Patient was placed on furosemide for diuresis.   10/08 added digoxin  10/09 resumed low dose carvedilol.    Assessment and Plan: * Acute on chronic systolic CHF (congestive heart failure) (HCC) Echocardiogram with LV systolic function <81%, global hypokinesis, abnormal paradoxical septal motion, severe reduction in RV systolic function, RV with severe dilatation, moderate to severe TR, RVSP 39,2 mmHg.   Documented urine output is 017 ml  Systolic blood  pressure 510/25 mmHg.  Volume status has improved.   Continue with SGLT 2 inh and digoxin.   Resumed low dose of carvedilol.  Not on spironolactone due to history of gynecomastia  Continue to hold on loop diuretic, probably will resume at the time of discharge to use as needed.   Patient has been deemed not candidate for advance therapies due to non compliance and alcohol abuse.   Coronary artery disease SP CABG on 11/2017  Contin with aspirin and statin therapy.  No chest pain.   Essential hypertension Improved blood pressure, resumed carvedilol at a low dose.  Close follow up on blood pressure.   Mixed hyperlipidemia Continue with statin therapy.   Seizures ([place]) Continue with keppra.   Alcohol abuse No signs of acute withdrawal Continue with as needed lorazepam po.    Hypokalemia Hyponatremia.   Renal function with serum cr at 1,33 K is 4,2 and serum bicarbonate at 27. Na 135  Plan to continue to follow up renal function in am, avoid hypotension and nephrotoxic medications.         Subjective: Patient with improvement in dyspnea, no chest pain,   Physical Exam: Vitals:   10/01/22 1513 10/01/22 1946 10/02/22 0346 10/02/22 0700  BP: 114/88 116/85 113/78   Pulse: 95 100 89   Resp: 16 18 16    Temp: 97.7 F (36.5 C) 97.9 F (36.6 C) 97.9 F (36.6 C)   TempSrc: Oral Oral Oral   SpO2: 96% 96% 97%   Weight:    63.4 kg  Height:       Neurology awake and alert ENT  with no pallor Cardiovascular with S1 and S2 present and rhythmic with no gallops Respiratory with no rales or wheezing Abdomen with no distention  No lower extremity edema  Data Reviewed:    Family Communication: no family at the bedside   Disposition: Status is: Inpatient Remains inpatient appropriate because: heart failure   Planned Discharge Destination: Home     Author: Coralie Keens, MD 10/02/2022 2:26 PM  For on call review [email or web address].

## 2022-10-02 NOTE — TOC Benefit Eligibility Note (Signed)
Patient Advocate Encounter  Insurance verification completed.    The patient is currently admitted and upon discharge could be taking Jardiance 10 mg.  Requires Prior Authorization  The patient is insured through [employer] Medicaid     Pardeep Pautz, CPhT Pharmacy Patient Advocate Specialist [facility] Patient Advocate Team Direct Number: [phone]  Fax: [phone]        

## 2022-10-02 NOTE — Evaluation (Signed)
Occupational Therapy Evaluation Patient Details Name: [name] MRN: [id number] DOB: [birth year] Today's Date: 10/02/2022   History of Present Illness Pt is a 61 y/o M admitted on 09/29/22 with the working diagnosis of decompensated heart failure. PMH: CAD s/p CABG, heart failure, seizures, alcohol abuse   Clinical Impression   [name] was evaluated s/p the above admission list, he is indep at baseline and assist his girlfriend as needed due to paraplegia. Pt lives in a "shed" outside of someone's home with power but no running water. Upon evaluation he demonstrated indep mobility and ADLs, and is likely at his cognitive baseline with mild impairments noted in delayed recall and slowed processing. Pt does not have acute OT needs. Recommend frequent mobiity with MS team. OT to sign off.      Recommendations for follow up therapy are one component of a multi-disciplinary discharge planning process, led by the attending physician.  Recommendations may be updated based on patient status, additional functional criteria and insurance authorization.   Follow Up Recommendations  No OT follow up    Assistance Recommended at Discharge PRN  Patient can return home with the following Direct supervision/assist for medications management    Functional Status Assessment  Patient has had a recent decline in their functional status and demonstrates the ability to make significant improvements in function in a reasonable and predictable amount of time.  Equipment Recommendations  None recommended by OT    Recommendations for Other Services       Precautions / Restrictions Precautions Precautions: None Restrictions Weight Bearing Restrictions: No      Mobility Bed Mobility Overal bed mobility: Independent                  Transfers Overall transfer level: Independent                        Balance Overall balance assessment: No apparent balance deficits (not formally  assessed)                                         ADL either performed or assessed with clinical judgement   ADL Overall ADL's : Independent               General ADL Comments: no AD, no overt LOB.     Vision Baseline Vision/History: 0 No visual deficits Vision Assessment?: No apparent visual deficits     Perception       Hand Dominance Right   Extremity/Trunk Assessment Upper Extremity Assessment Upper Extremity Assessment: Overall WFL for tasks assessed   Lower Extremity Assessment Lower Extremity Assessment: Overall WFL for tasks assessed   Cervical / Trunk Assessment Cervical / Trunk Assessment: Normal   Communication Communication Communication: No difficulties   Cognition Arousal/Alertness: Awake/alert Behavior During Therapy: WFL for tasks assessed/performed Overall Cognitive Status: No family/caregiver present to determine baseline cognitive functioning                 General Comments: Verbose and mildyl self-distracting. OVerall WFL for tasks assessed. complete the short blessed test with errors in delayed recall. Pt likely near his cognitive baseline.     General Comments  VSS on RA     Home Living Family/patient expects to be discharged to:: Private residence Living Arrangements:  (lives in a shed behind someones house.) Available Help at Discharge: Family;Available PRN/intermittently Type  of Home: Other(Comment) (shed, power but no running water) Home Access: Level entry     Home Layout: One level               Home Equipment: None   Additional Comments: Lives with girlfriend who has paraplegia. Lives in shed with power, no running water. Able to go into the main house for bathing and toileting "as needed."      Prior Functioning/Environment Prior Level of Function : Independent/Modified Independent             Mobility Comments: indep, no AD. ADLs Comments: Does not work or drive. Assists girlfriend  (paraplegic).        OT Problem List: Decreased activity tolerance      OT Treatment/Interventions:      OT Goals(Current goals can be found in the care plan section) Acute Rehab OT Goals Patient Stated Goal: home OT Goal Formulation: With patient Time For Goal Achievement: 10/02/22 Potential to Achieve Goals: Good  OT Frequency:         AM-PAC OT "6 Clicks" Daily Activity     Outcome Measure Help from another person eating meals?: None Help from another person taking care of personal grooming?: None Help from another person toileting, which includes using toliet, bedpan, or urinal?: None Help from another person bathing (including washing, rinsing, drying)?: None Help from another person to put on and taking off regular upper body clothing?: None Help from another person to put on and taking off regular lower body clothing?: None 6 Click Score: 24   End of Session Nurse Communication: Mobility status  Activity Tolerance: Patient tolerated treatment well Patient left: in chair;with call bell/phone within reach;with chair alarm set  OT Visit Diagnosis: Other abnormalities of gait and mobility (R26.89)                Time: 1610-9604 OT Time Calculation (min): 14 min Charges:  OT General Charges $OT Visit: 1 Visit OT Evaluation $OT Eval Low Complexity: 1 Low   Hartwell Vandiver D Causey 10/02/2022, 11:39 AM

## 2022-10-02 NOTE — Evaluation (Signed)
Physical Therapy Evaluation Patient Details Name: [name] MRN: [id number] DOB: 05/02/1961 Today's Date: 10/02/2022  History of Present Illness  Pt is a 61 y/o M admitted on 09/29/22 with the working diagnosis of decompensated heart failure. PMH: CAD s/p CABG, heart failure, seizures, alcohol abuse  Clinical Impression  Pt seen for PT evaluation with pt agreeable to tx. Pt reports prior to admission he was independent without AD, living with girlfriend who has paraplegia, in a 1 level apartment with level entry. On this date, pt completes bed mobility mod I, STS & gait independently without AD. During gait pt engages in head turns & speed changes without overt LOB. Pt appears to be at baseline level of function & does not require acute PT services nor f/u PT; recommend pt continue to mobilize while in acute setting.     Recommendations for follow up therapy are one component of a multi-disciplinary discharge planning process, led by the attending physician.  Recommendations may be updated based on patient status, additional functional criteria and insurance authorization.  Follow Up Recommendations No PT follow up      Assistance Recommended at Discharge PRN  Patient can return home with the following       Equipment Recommendations None recommended by PT  Recommendations for Other Services       Functional Status Assessment Patient has not had a recent decline in their functional status     Precautions / Restrictions Precautions Precautions: None Restrictions Weight Bearing Restrictions: No      Mobility  Bed Mobility Overal bed mobility: Modified Independent                  Transfers Overall transfer level: Independent Equipment used: None               General transfer comment: STS from EOB    Ambulation/Gait Ambulation/Gait assistance: Independent Gait Distance (Feet): 200 Feet Assistive device: None Gait Pattern/deviations: Wide base of  support Gait velocity: WNL        Stairs            Wheelchair Mobility    Modified Rankin (Stroke Patients Only)       Balance   Sitting-balance support: Feet supported Sitting balance-Leahy Scale: Normal     Standing balance support: During functional activity, No upper extremity supported Standing balance-Leahy Scale: Good                               Pertinent Vitals/Pain Pain Assessment Pain Assessment: No/denies pain    Home Living Family/patient expects to be discharged to:: Private residence (pt reports he rents an apartment from someone) Living Arrangements:  (girlfriend) Available Help at Discharge: Friend(s);Available PRN/intermittently Type of Home: Apartment Home Access: Level entry       Home Layout: One level Home Equipment: None Additional Comments: Lives with girlfriend who has paraplegia.    Prior Function Prior Level of Function : Independent/Modified Independent             Mobility Comments: Pt reports he's independent without AD. Endorses 1 fall in past 6 months 2/2 tripping over a rock.       Hand Dominance        Extremity/Trunk Assessment   Upper Extremity Assessment Upper Extremity Assessment: Overall WFL for tasks assessed    Lower Extremity Assessment Lower Extremity Assessment: Overall WFL for tasks assessed       Communication  Communication: No difficulties  Cognition Arousal/Alertness: Awake/alert Behavior During Therapy: WFL for tasks assessed/performed Overall Cognitive Status: Within Functional Limits for tasks assessed                                          General Comments General comments (skin integrity, edema, etc.): Max HR 109 bpm    Exercises     Assessment/Plan    PT Assessment Patient does not need any further PT services  PT Problem List         PT Treatment Interventions      PT Goals (Current goals can be found in the Care Plan section)   Acute Rehab PT Goals Patient Stated Goal: return home to help girlfriend PT Goal Formulation: With patient Time For Goal Achievement: 10/16/22 Potential to Achieve Goals: Good    Frequency       Co-evaluation               AM-PAC PT "6 Clicks" Mobility  Outcome Measure Help needed turning from your back to your side while in a flat bed without using bedrails?: None Help needed moving from lying on your back to sitting on the side of a flat bed without using bedrails?: None Help needed moving to and from a bed to a chair (including a wheelchair)?: None Help needed standing up from a chair using your arms (e.g., wheelchair or bedside chair)?: None Help needed to walk in hospital room?: None Help needed climbing 3-5 steps with a railing? : None 6 Click Score: 24    End of Session   Activity Tolerance: Patient tolerated treatment well Patient left: in bed;with call bell/phone within reach;with bed alarm set Nurse Communication: Mobility status      Time: 4010-2725 PT Time Calculation (min) (ACUTE ONLY): 10 min   Charges:   PT Evaluation $PT Eval Low Complexity: [name], PT, DPT 10/02/22, 10:14 AM   [name] 10/02/2022, 10:13 AM

## 2022-10-02 NOTE — Telephone Encounter (Signed)
Pharmacy Patient Advocate Encounter  Insurance verification completed.    The patient is insured through [facility]   The patient is currently admitted and ran test claims for the following: Jardiance .  Copays and coinsurance results were relayed to Inpatient clinical team.

## 2022-10-02 NOTE — Telephone Encounter (Signed)
Patient Advocate Encounter  Prior Authorization for Jardiance 10MG  tablets has been approved.    PA# [id number] Effective dates: 10/02/2022 through 10/03/2023  Patients co-pay is $4.00.     Lyndel Safe, [place] Patient Advocate Specialist [place] Patient Advocate Team Direct Number: [phone]  Fax: [phone]

## 2022-10-03 ENCOUNTER — Other Ambulatory Visit (HOSPITAL_COMMUNITY): Payer: Self-pay

## 2022-10-03 DIAGNOSIS — I10 Essential (primary) hypertension: Secondary | ICD-10-CM | POA: Diagnosis not present

## 2022-10-03 DIAGNOSIS — I5023 Acute on chronic systolic (congestive) heart failure: Secondary | ICD-10-CM | POA: Diagnosis not present

## 2022-10-03 DIAGNOSIS — I2510 Atherosclerotic heart disease of native coronary artery without angina pectoris: Secondary | ICD-10-CM | POA: Diagnosis not present

## 2022-10-03 DIAGNOSIS — E782 Mixed hyperlipidemia: Secondary | ICD-10-CM | POA: Diagnosis not present

## 2022-10-03 LAB — BASIC METABOLIC PANEL
Anion gap: 10 (ref 5–15)
BUN: 19 mg/dL (ref 6–20)
CO2: 26 mmol/L (ref 22–32)
Calcium: 8.7 mg/dL — ABNORMAL LOW (ref 8.9–10.3)
Chloride: 98 mmol/L (ref 98–111)
Creatinine, Ser: 1.32 mg/dL — ABNORMAL HIGH (ref 0.61–1.24)
GFR, Estimated: >60 mL/min (ref >60)
Glucose, Bld: 114 mg/dL — ABNORMAL HIGH (ref 70–99)
Potassium: 3.7 mmol/L (ref 3.5–5.1)
Sodium: 134 mmol/L — ABNORMAL LOW (ref 135–145)

## 2022-10-03 MED ORDER — ATORVASTATIN CALCIUM 80 MG PO TABS
80.0000 mg | ORAL_TABLET | Freq: Every day | ORAL | 0 refills | Status: DC
  Filled 2022-10-03: qty 30, 30d supply, fill #0

## 2022-10-03 MED ORDER — CARVEDILOL 3.125 MG PO TABS
3.1250 mg | ORAL_TABLET | Freq: Two times a day (BID) | ORAL | 0 refills | Status: DC
  Filled 2022-10-03: qty 60, 30d supply, fill #0

## 2022-10-03 MED ORDER — ASPIRIN 81 MG PO TBEC
81.0000 mg | DELAYED_RELEASE_TABLET | Freq: Every day | ORAL | 12 refills | Status: DC
  Filled 2022-10-03: qty 30, 30d supply, fill #0

## 2022-10-03 MED ORDER — EMPAGLIFLOZIN 10 MG PO TABS
10.0000 mg | ORAL_TABLET | Freq: Every day | ORAL | 0 refills | Status: DC
  Filled 2022-10-03: qty 30, 30d supply, fill #0

## 2022-10-03 MED ORDER — POTASSIUM CHLORIDE CRYS ER 20 MEQ PO TBCR
40.0000 meq | EXTENDED_RELEASE_TABLET | Freq: Every day | ORAL | Status: DC
  Administered 2022-10-03: 40 meq via ORAL
  Filled 2022-10-03: qty 2

## 2022-10-03 MED ORDER — DIGOXIN 125 MCG PO TABS
0.1250 mg | ORAL_TABLET | Freq: Every day | ORAL | 0 refills | Status: DC
  Filled 2022-10-03: qty 30, 30d supply, fill #0

## 2022-10-03 MED ORDER — POTASSIUM CHLORIDE CRYS ER 20 MEQ PO TBCR
40.0000 meq | EXTENDED_RELEASE_TABLET | Freq: Every day | ORAL | 0 refills | Status: DC
  Filled 2022-10-03: qty 60, 30d supply, fill #0

## 2022-10-03 MED ORDER — FUROSEMIDE 40 MG PO TABS
40.0000 mg | ORAL_TABLET | Freq: Every day | ORAL | Status: DC
  Administered 2022-10-03: 40 mg via ORAL
  Filled 2022-10-03: qty 1

## 2022-10-03 NOTE — Progress Notes (Addendum)
Advanced Heart Failure Rounding Note  PCP-Cardiologist: None   Subjective:     Brisk diuresis yesterday with IV lasix but weight down just 1 lb, overall down 8 lb from admit.   BP stable.  Feeling much better. No dyspnea, orthopnea or PND.    Objective:   Weight Range: 62.9 kg Body mass index is 19.9 kg/m.   Vital Signs:   Temp:  [97.5 F (36.4 C)-98.2 F (36.8 C)] 97.5 F (36.4 C) (10/10 0600) Pulse Rate:  [88-98] 88 (10/10 0600) Resp:  [16] 16 (10/10 0600) BP: (103-114)/(67-69) 114/69 (10/10 0600) SpO2:  [97 %] 97 % (10/10 0600) Weight:  [62.9 kg] 62.9 kg (10/10 0600) Last BM Date : 09/30/22  Weight change: Filed Weights   10/01/22 0518 10/02/22 0700 10/03/22 0600  Weight: 63.2 kg 63.4 kg 62.9 kg    Intake/Output:   Intake/Output Summary (Last 24 hours) at 10/03/2022 0746 Last data filed at 10/03/2022 0557 Gross per 24 hour  Intake 720 ml  Output 4800 ml  Net -4080 ml      Physical Exam    General:  Lying comfortably in bed.  HEENT: Normal Neck: Supple. JVP 7-8 cm. Carotids 2+ bilat; no bruits.  Cor: PMI nondisplaced. Regular rate & rhythm. No rubs, gallops or murmurs. Lungs: Clear Abdomen: Soft, nontender, nondistended.  Extremities: No cyanosis, clubbing, rash, edema Neuro: Alert & orientedx3, cranial nerves grossly intact. moves all 4 extremities w/o difficulty. Affect pleasant   Telemetry   SR 80s-90s, < 5 PVCs/min  Labs    CBC No results for input(s): "WBC", "NEUTROABS", "HGB", "HCT", "MCV", "PLT" in the last 72 hours. Basic Metabolic Panel Recent Labs    44/01/02 0159 10/02/22 0109 10/03/22 0041  NA 133* 135 134*  K 4.2 4.2 3.7  CL 97* 99 98  CO2 26 27 26   GLUCOSE 107* 102* 114*  BUN 18 18 19   CREATININE 1.23 1.33* 1.32*  CALCIUM 9.2 9.4 8.7*  MG 2.0  --   --    Liver Function Tests No results for input(s): "AST", "ALT", "ALKPHOS", "BILITOT", "PROT", "ALBUMIN" in the last 72 hours. No results for input(s): "LIPASE",  "AMYLASE" in the last 72 hours. Cardiac Enzymes No results for input(s): "CKTOTAL", "CKMB", "CKMBINDEX", "TROPONINI" in the last 72 hours.  BNP: BNP (last 3 results) Recent Labs    09/08/22 1052 09/11/22 1241 09/29/22 1325  BNP 1,094.7* 1,326.5* 1,964.6*    ProBNP (last 3 results) No results for input(s): "PROBNP" in the last 8760 hours.   D-Dimer No results for input(s): "DDIMER" in the last 72 hours. Hemoglobin A1C No results for input(s): "HGBA1C" in the last 72 hours. Fasting Lipid Panel No results for input(s): "CHOL", "HDL", "LDLCALC", "TRIG", "CHOLHDL", "LDLDIRECT" in the last 72 hours. Thyroid Function Tests No results for input(s): "TSH", "T4TOTAL", "T3FREE", "THYROIDAB" in the last 72 hours.  Invalid input(s): "FREET3"  Other results:   Imaging    No results found.   Medications:     Scheduled Medications:  aspirin EC  81 mg Oral Daily   atorvastatin  80 mg Oral Daily   carvedilol  3.125 mg Oral BID WC   digoxin  0.125 mg Oral Daily   empagliflozin  10 mg Oral Daily   levETIRAcetam  500 mg Oral BID   multivitamin with minerals  1 tablet Oral Daily   sodium chloride flush  3 mL Intravenous Q12H    Infusions:   PRN Medications: acetaminophen **OR** acetaminophen, LORazepam, senna-docusate  Assessment/Plan   1. A/C Biventricular HF, ICM  On Echo,  biventricular failure getting worse from LVEF 30-35% Mod RV--> LVEF < 20% severely reduced RV. Has [employer] ICD.  Admitted with volume overload in the setting of medication/diet noncompliance. BNP 1964. Reds Clip 48% on admit --> today 39%. - Diuresed 8 lb with IV lasix. Volume looks good on exam today. Start po lasix 40 daily + 40 mEq potassium daily.  - GDMT has been limited given compliance and hypotension. Will try to keep regimen simple.  - Continue Coreg 3.125 mg BID - Continue digoxin 0.125 mg daily.   - Continue jardiance 10 mg daily. $ 4.00 co-pay.   - Hold off on ARB and will  likely need to consider as an outpatient.  - Hold Spiro with h/o gynecomastia. Could consider Inspra but I am concerned about his follow up.  - Renal function ok.  - Not a candidate for advanced therapies/transplant with ongoing ETOH/substance abuse and compliance (this includes home inotrope).    2. CAD - S/P CABG x 3 2018  - No chest pain - On aspirin, atorvastatin.    3. Seizure Disorder -On Keppra    4. ETOH Abuse -Discussed cessation     Stable for discharge from HF perspective. Very difficult to manage with ongoing substance abuse and noncompliance. Have not been able to enroll in Paramedicine, the team has been unable to maintain contact with him.   Will need all meds from [place] at discharge.   Has close f/u scheduled in HF clinic on 10/16.   HF medications at discharge: Aspirin 81 mg daily Atorvastatin 80 mg daily Coreg 3.125 mg BID Digoxin 0.125 mg daily Jardiance 10 mg daily Lasix 40 mg daily Potassium chloride 40 mEq daily   Length of Stay: 3  FINCH, LINDSAY N, PA-C  10/03/2022, 7:46 AM  Advanced Heart Failure Team Pager [phone] (M-F; 7a - 5p)  Please contact [facility] for night-coverage after hours (5p -7a ) and weekends on [email or web address]  Patient seen and examined with the above-signed Advanced Practice Provider and/or Housestaff. I personally reviewed laboratory data, imaging studies and relevant notes. I independently examined the patient and formulated the important aspects of the plan. I have edited the note to reflect any of my changes or salient points. I have personally discussed the plan with the patient and/or family.  Breathing much improved with diuresis. Wants to go home. No SOB, orthopnea or PND.   General:  Well appearing. No resp difficulty HEENT: normal Neck: supple. no JVD. Carotids 2+ bilat; no bruits. No lymphadenopathy or thryomegaly appreciated. Cor: PMI nondisplaced. Regular rate & rhythm. No rubs, gallops or  murmurs. Lungs: clear Abdomen: soft, nontender, nondistended. No hepatosplenomegaly. No bruits or masses. Good bowel sounds. Extremities: no cyanosis, clubbing, rash, edema Neuro: alert & orientedx3, cranial nerves grossly intact. moves all 4 extremities w/o difficulty. Affect pleasant  Ok for d/c today from HF standpoint. Agree with meds as above. Stressed need for med compliance and f/u in HF Clinic.   Glori Bickers, MD  11:23 AM

## 2022-10-03 NOTE — TOC Initial Note (Signed)
Transition of Care [facility]) - Initial/Assessment Note    Patient Details  Name: [name] MRN: [id number] Date of Birth: May 16, 1961  Transition of Care [facility]) CM/SW Contact:    Erenest Rasher, RN Phone Number: [phone] 10/03/2022, 10:13 AM  Clinical Narrative:                 HF TOC CM spoke to pt and provided with scale and meds bag to store meds. Provided taxi ride home, approved by [employer]. Request meds come up from [place] at dc. Scheduled appt for [facility] for 11/08/2022 at 1:50 pm to follow up with PCP, Geryl Rankins NP.   Expected Discharge Plan: Home/Self Care Barriers to Discharge: No Barriers Identified   Patient Goals and CMS Choice        Expected Discharge Plan and Services Expected Discharge Plan: Home/Self Care   Discharge Planning Services: Follow-up appt scheduled   Living arrangements for the past 2 months: Single Family Home                                      Prior Living Arrangements/Services Living arrangements for the past 2 months: Single Family Home Lives with:: Roommate Patient language and need for interpreter reviewed:: Yes Do you feel safe going back to the place where you live?: Yes      Need for Family Participation in Patient Care: No (Comment) Care giver support system in place?: No (comment)   Criminal Activity/Legal Involvement Pertinent to Current Situation/Hospitalization: No - Comment as needed  Activities of Daily Living Home Assistive Devices/Equipment: None ADL Screening (condition at time of admission) Patient's cognitive ability adequate to safely complete daily activities?: Yes Is the patient deaf or have difficulty hearing?: No Does the patient have difficulty seeing, even when wearing glasses/contacts?: No Does the patient have difficulty concentrating, remembering, or making decisions?: No Patient able to express need for assistance with ADLs?: Yes Does the  patient have difficulty dressing or bathing?: No Independently performs ADLs?: Yes (appropriate for developmental age) Does the patient have difficulty walking or climbing stairs?: No Weakness of Legs: None Weakness of Arms/Hands: None  Permission Sought/Granted Permission sought to share information with : Case Manager, Family Supports, PCP Permission granted to share information with : Yes, Verbal Permission Granted  Share Information with NAME: Alejandra Barna     Permission granted to share info w Relationship: sister  Permission granted to share info w Contact Information: [phone]  Emotional Assessment Appearance:: Appears stated age Attitude/Demeanor/Rapport: Engaged Affect (typically observed): Accepting Orientation: : Oriented to Self, Oriented to Place, Oriented to  Time, Oriented to Situation Alcohol / Substance Use: Alcohol Use Psych Involvement: No (comment)  Admission diagnosis:  Acute on chronic systolic congestive heart failure (HCC) [I50.23] Acute on chronic systolic CHF (congestive heart failure) (HCC) [I50.23] Patient Active Problem List   Diagnosis Date Noted   Acute on chronic systolic CHF (congestive heart failure) ([place]) 09/30/2022   Hypokalemia 09/30/2022   Coronary artery disease    Closed fracture of right upper limb 01/27/2021   Pneumonia due to COVID-19 virus 08/14/2020   Hyponatremia 08/14/2020   Elevated AST (SGOT) 08/14/2020   Prolonged QT interval 08/14/2020   Cardiac arrest, cause unspecified ([place]) 07/06/2020   Left arm cellulitis 05/19/2020   Essential hypertension 05/18/2020   Mixed hyperlipidemia 05/18/2020   Alcohol abuse 05/18/2020   Cellulitis of  left upper extremity 05/18/2020   Lactic acidosis 05/18/2020   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, 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) 12/30/2019   Seizures (HCC) 10/21/2019   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 [facility] [place] [address] Phone: [phone] Fax: [phone]  [facility] - [place], [place] - [address] [place] [address] Phone: [phone] Fax: [phone]     Social Determinants of Health (SDOH) Interventions Food Insecurity Interventions: Intervention Not Indicated Housing Interventions: Intervention Not Indicated Transportation Interventions: Intervention Not Indicated Utilities Interventions: Intervention Not Indicated Financial Strain Interventions: Intervention Not Indicated  Readmission Risk Interventions     No data to display

## 2022-10-03 NOTE — Discharge Instructions (Signed)
                  Intensive Outpatient Programs  [facility]    [facility] [address]     [address] [place],  [place]     [place], [place] [phone]      [phone]  [facility]   [facility]  (Inpatient and outpatient)  [phone] (Suboxone and Methadone) [address]           [phone]           [facility]    [facility] - Intensive Outpatient [address]     [address] [place], [place] [address]     [place], [place]  [phone]      [phone]  [facility] (Outpatient, Inpatient, [facility]  [facility] (Groups and [facility]) (insurance only) [phone]    [place], [place]          [phone]       [facility]    [facility] (for caregivers and family) [address]     [address] [place], [place]     [place], [place] [phone]      [phone]  Residential Treatment Programs  [facility]  [facility](2 years) Residential: 90 days)  [facility] [address]      [address] [place], [place]     [place], [place] [phone]      [phone] or [phone]  [facility]    [facility] [address]      [address] [place], [place]     [place], [place] [phone]      [phone]  [facility]   [facility] ([facility]) [address]     [address] [place], [place] [address]     [place], [place] [phone]      [phone] Admissions: 8am-3pm M-F  [facility] Program: Residential Program (90 Days)              [facility]: [facility]  [place], [place]     [place], [place]  [phone] or [phone]    (Walk in Hours over the weekend or by referral)   [place]: [facility]:[phone] (for crisis  response 24 hours a day) 

## 2022-10-03 NOTE — Discharge Summary (Signed)
Physician Discharge Summary   Patient: [name] MRN: [id number] DOB: [birth year]  Admit date:     09/29/2022  Discharge date: 10/03/22  Discharge Physician: Jimmy Picket Addis Bennie   PCP: Gildardo Pounds, NP   Recommendations at discharge:    Patient has been placed on digoxin and empagliflozin Decreased dose of carvedilol Continue with furosemide and Kcl supplements.  Follow up with cardiology as outpatient.   Discharge Diagnoses: Principal Problem:   Acute on chronic systolic CHF (congestive heart failure) (HCC) Active Problems:   Coronary artery disease   Essential hypertension   Mixed hyperlipidemia   Seizures (HCC)   Alcohol abuse   Hypokalemia  Resolved Problems:   * No resolved hospital problems. [facility] Course: [name] was admitted to the hospital with the working diagnosis of decompensated heart failure.   61 yo male with the past medical history of coronary artery disease sp CABG, heart failure, seizures and alcohol abuse who presented with dyspnea and fatigue. Patient had progressive dyspnea on exertion, getting symptomatic with minimal efforts. He was evaluated the heart failure clinical and was noted to be hypotensive and hypovolemic, and he was referred to the ED for further evaluation. On his initial physical examination in the ED his blood pressure was 130/97, HR 103, RR 14 and 02 saturation 98%, lungs with no wheezing, or rhonchi, heart with S1 and S2 present and rhythmic with no gallops, positive JVD, no abdominal distention and positive pitting bilateral lower extremity edema.   Na 134, K 3,8 CL 93, bicarbonate 27, glucose 97 bun 17 cr 1,20  BNP 1,964  High sensitive troponin 12 and 11  Wbc 8,4 hgb 14.2 plt 156  Toxicology positive for amphetamines.   Chest radiograph with cardiomegaly with mild hilar vascular congestion, small bilateral pleural effusions, implantable defibrillator in place.   EKG 99 bpm, normal axis, interventricular  conduction delay, qtc 536, sinus rhythm with poor R to R wave progression, no significant ST segment or T wave changes.  Patient was placed on furosemide for diuresis.   10/08 added digoxin  10/09 resumed low dose carvedilol.    Assessment and Plan: * Acute on chronic systolic CHF (congestive heart failure) (HCC) Echocardiogram with LV systolic function [id number], global hypokinesis, abnormal paradoxical septal motion, severe reduction in RV systolic function, RV with severe dilatation, moderate to severe TR, RVSP 39,2 mmHg.   Patient as placed on IV furosemide, negative fluid balance was achieved, - 8,341 ml, with significant improvement in his symptoms.  Blood pressure stabilized, added empagliflozin, digoxin and resumed lower dose of carvedilol with good toleration.   Not on spironolactone due to history of gynecomastia  Patient will continue diuresis as outpatient with oral furosemide.  Follow up with heart failure clinic as outpatient.   Coronary artery disease SP CABG on 11/2017  Contin with aspirin and statin therapy.  No chest pain.   Essential hypertension Improved blood pressure, resumed carvedilol at a low dose.   Mixed hyperlipidemia Continue with statin therapy.   Seizures ([place]) Continue with keppra.   Alcohol abuse No signs of acute withdrawal. Alcohol cessation counseling.   Hypokalemia Hyponatremia.   At the time of his discharge his renal function has a serum cr of 1,32 with K at 3,7 and serum bicarbonate at 26. NA 134.   Plan to continue diuresis with furosemide and SGLT 2 inh Follow up renal function as outpatient and continue K supplementation.          Consultants: cardiology  Procedures performed: none  Disposition: Home Diet recommendation:  Discharge Diet Orders (From admission, onward)     Start     Ordered   10/03/22 0000  Diet - low sodium heart healthy        10/03/22 1048           Cardiac diet DISCHARGE MEDICATION: Allergies  as of 10/03/2022       Reactions   Aldactone [spironolactone] Other (See Comments)   Gynecomastia         Medication List     TAKE these medications    aspirin EC 81 MG tablet Take 1 tablet (81 mg total) by mouth daily. Swallow whole. Start taking on: October 04, 2022   atorvastatin 80 MG tablet Commonly known as: LIPITOR Take 1 tablet (80 mg total) by mouth daily. Start taking on: October 04, 2022   carvedilol 3.125 MG tablet Commonly known as: COREG Take 1 tablet (3.125 mg total) by mouth 2 (two) times daily with a meal. What changed:  medication strength how much to take when to take this   digoxin 0.125 MG tablet Commonly known as: LANOXIN Take 1 tablet (0.125 mg total) by mouth daily. Start taking on: October 04, 2022   empagliflozin 10 MG Tabs tablet Commonly known as: JARDIANCE Take 1 tablet (10 mg total) by mouth daily. Start taking on: October 04, 2022   furosemide 40 MG tablet Commonly known as: LASIX Take 40 mg by mouth daily. What changed: Another medication with the same name was removed. Continue taking this medication, and follow the directions you see here.   levETIRAcetam 500 MG tablet Commonly known as: KEPPRA TAKE 1 TABLET BY MOUTH 2 TIMES DAILY What changed: when to take this   potassium chloride SA 20 MEQ tablet Commonly known as: KLOR-CON M Take 2 tablets (40 mEq total) by mouth daily. Start taking on: October 04, 2022 What changed: how much to take        [place] Follow up on 10/09/2022.   Specialty: Cardiology Why: at 3:30. Bring all medications. Contact information: [address] [id number] [place] [phone]               Discharge Exam: [name] Weights   10/01/22 0518 10/02/22 0700 10/03/22 0600  Weight: 63.2 kg 63.4 kg 62.9 kg   BP 114/69 (BP Location: Right Arm)   Pulse 88   Temp (!) 97.5 F (36.4  C) (Oral)   Resp 16   Ht 5\' 10"  (1.778 m)   Wt 62.9 kg   SpO2 97%   BMI 19.90 kg/m   Patient with no chest pain or dyspnea, no lower extremity edema  Neurology awake and alert ENT with no pallor Cardiovascular with S1 and S2 present and rhythmic with no gallops Respiratory with no rales or wheezing Abdomen with no distention  No lower extremity edema   Condition at discharge: stable  The results of significant diagnostics from this hospitalization (including imaging, microbiology, ancillary and laboratory) are listed below for reference.   Imaging Studies: ECHOCARDIOGRAM COMPLETE  Result Date: 09/30/2022    ECHOCARDIOGRAM REPORT   Patient Name:   PRAJIT BALTAZAR [facility] Date of Exam: 09/30/2022 Medical Rec #:  [id number]           Height:       70.0 in Accession #:    [id number]  Weight:       146.0 lb Date of Birth:  [birth year]            BSA:          1.826 m Patient Age:    61 years            BP:           102/76 mmHg Patient Gender: M                   HR:           88 bpm. Exam Location:  Inpatient Procedure: 2D Echo Indications:    acute systolic chf  History:        Patient has prior history of Echocardiogram examinations, most                 recent 01/09/2020. Pacemaker, Defibrillator and Prior CABG; Risk                 Factors:Alcohol abuse.  Sonographer:    Delcie Roch RDCS Referring Phys: [id number] TIMOTHY S OPYD IMPRESSIONS  1. Left ventricular ejection fraction, by estimation, is <20%. The left ventricle has severely decreased function. The left ventricle demonstrates global hypokinesis. The left ventricular internal cavity size was mildly dilated. Left ventricular diastolic parameters are consistent with Grade II diastolic dysfunction (pseudonormalization). Elevated left atrial pressure. There is abnormal (paradoxical) septal motion, consistent with right ventricular volume overload.  2. Right ventricular systolic function is severely reduced. The right ventricular size  is severely enlarged. There is mildly elevated pulmonary artery systolic pressure.  3. Left atrial size was moderately dilated.  4. Right atrial size was mildly dilated.  5. The mitral valve is normal in structure. Mild mitral valve regurgitation.  6. The tricuspid valve is abnormal. Tricuspid valve regurgitation is moderate to severe.  7. The aortic valve is tricuspid. Aortic valve regurgitation is not visualized. Aortic valve sclerosis is present, with no evidence of aortic valve stenosis. Comparison(s): The left ventricular function is significantly worse. The left ventricular diastolic function is significantly worse. FINDINGS  Left Ventricle: Left ventricular ejection fraction, by estimation, is <20%. The left ventricle has severely decreased function. The left ventricle demonstrates global hypokinesis. The left ventricular internal cavity size was mildly dilated. There is no  left ventricular hypertrophy. Abnormal (paradoxical) septal motion, consistent with right ventricular volume overload. Left ventricular diastolic parameters are consistent with Grade II diastolic dysfunction (pseudonormalization). Elevated left atrial pressure. Right Ventricle: The right ventricular size is severely enlarged. No increase in right ventricular wall thickness. Right ventricular systolic function is severely reduced. There is mildly elevated pulmonary artery systolic pressure. The tricuspid regurgitant velocity is 2.46 m/s, and with an assumed right atrial pressure of 15 mmHg, the estimated right ventricular systolic pressure is 39.2 mmHg. Left Atrium: Left atrial size was moderately dilated. Right Atrium: Right atrial size was mildly dilated. Pericardium: There is no evidence of pericardial effusion. Mitral Valve: The mitral valve is normal in structure. Mild mitral annular calcification. Mild mitral valve regurgitation. Tricuspid Valve: The tricuspid valve is abnormal. Tricuspid valve regurgitation is moderate to severe.  Aortic Valve: The aortic valve is tricuspid. Aortic valve regurgitation is not visualized. Aortic valve sclerosis is present, with no evidence of aortic valve stenosis. Pulmonic Valve: The pulmonic valve was normal in structure. Pulmonic valve regurgitation is not visualized. Aorta: The aortic root and ascending aorta are structurally normal, with no evidence of dilitation. IAS/Shunts: No atrial level shunt detected  by color flow Doppler. Additional Comments: A device lead is visualized in the right ventricle.  LEFT VENTRICLE PLAX 2D LVIDd:         5.70 cm   Diastology LVIDs:         5.20 cm   LV e' lateral:   13.70 cm/s LV PW:         0.90 cm   LV E/e' lateral: 4.6 LV IVS:        1.00 cm LVOT diam:     2.30 cm LV SV:         30 LV SV Index:   16 LVOT Area:     4.15 cm  RIGHT VENTRICLE            IVC RV S prime:     4.68 cm/s  IVC diam: 2.80 cm TAPSE (M-mode): 0.6 cm LEFT ATRIUM             Index        RIGHT ATRIUM           Index LA diam:        4.50 cm 2.46 cm/m   RA Area:     19.60 cm LA Vol (A2C):   81.2 ml 44.47 ml/m  RA Volume:   60.50 ml  33.13 ml/m LA Vol (A4C):   64.7 ml 35.43 ml/m LA Biplane Vol: 74.2 ml 40.64 ml/m  AORTIC VALVE LVOT Vmax:   54.10 cm/s LVOT Vmean:  34.500 cm/s LVOT VTI:    0.071 m  AORTA Ao Root diam: 3.20 cm Ao Asc diam:  3.10 cm MITRAL VALVE               TRICUSPID VALVE MV Area (PHT): 5.66 cm    TR Peak grad:   24.2 mmHg MV Decel Time: 134 msec    TR Vmax:        246.00 cm/s MV E velocity: 62.90 cm/s MV A velocity: 46.70 cm/s  SHUNTS MV E/A ratio:  1.35        Systemic VTI:  0.07 m                            Systemic Diam: 2.30 cm Dani Gobble Croitoru MD Electronically signed by Sanda Klein MD Signature Date/Time: 09/30/2022/12:48:00 PM    Final    DG Chest 2 View  Result Date: 09/29/2022 CLINICAL DATA:  Shortness of breath for 1 week EXAM: CHEST - 2 VIEW COMPARISON:  Chest x-ray July 23, 2022, chest CT August 14, 2020 FINDINGS: Intact left chest wall AICD. Stable  cardiomediastinal contours status post median sternotomy and CABG. Unchanged elevation of the left hemidiaphragm with left basilar atelectasis/scarring. No new focal pulmonary opacity. No large pneumothorax or pleural effusion. No acute osseous abnormality. The visualized upper abdomen is unremarkable. IMPRESSION: No acute cardiopulmonary abnormality. Electronically Signed   By: Beryle Flock M.D.   On: 09/29/2022 13:51    Microbiology: Results for orders placed or performed during the hospital encounter of 08/13/20  Blood Culture (routine x 2)     Status: None   Collection Time: 08/14/20  5:40 AM   Specimen: BLOOD  Result Value Ref Range Status   Specimen Description BLOOD LEFT ANTECUBITAL  Final   Special Requests   Final    BOTTLES DRAWN AEROBIC AND ANAEROBIC Blood Culture adequate volume   Culture   Final    NO GROWTH 5 DAYS Performed at [facility]  Lab, [address] [place], [place] [address]    Report Status 08/19/2020 FINAL  Final  Blood Culture (routine x 2)     Status: None   Collection Time: 08/14/20  5:45 AM   Specimen: BLOOD  Result Value Ref Range Status   Specimen Description BLOOD RIGHT ANTECUBITAL  Final   Special Requests   Final    BOTTLES DRAWN AEROBIC AND ANAEROBIC Blood Culture adequate volume   Culture   Final    NO GROWTH 5 DAYS Performed at [facility], [place] [address] [place], [place] [address]    Report Status 08/19/2020 FINAL  Final  SARS Coronavirus 2 by RT PCR (hospital order, performed in [facility] hospital lab) Nasopharyngeal Nasopharyngeal Swab     Status: Abnormal   Collection Time: 08/14/20  6:00 AM   Specimen: Nasopharyngeal Swab  Result Value Ref Range Status   SARS Coronavirus 2 POSITIVE (A) NEGATIVE Final    Comment: RESULT CALLED TO, READ BACK BY AND VERIFIED WITH: RN E. [id number] FCP (NOTE) SARS-CoV-2 target nucleic acids are DETECTED  SARS-CoV-2 RNA is generally detectable in upper respiratory specimens  during  the acute phase of infection.  Positive results are indicative  of the presence of the identified virus, but do not rule out bacterial infection or co-infection with other pathogens not detected by the test.  Clinical correlation with patient history and  other diagnostic information is necessary to determine patient infection status.  The expected result is negative.  Fact Sheet for Patients:   StrictlyIdeas.no   Fact Sheet for Healthcare Providers:   BankingDealers.co.za    This test is not yet approved or cleared by the [place] FDA and  has been authorized for detection and/or diagnosis of SARS-CoV-2 by FDA under an Emergency Use Authorization (EUA).  This EUA will remain in effect (meaning this test can b e used) for the duration of  the COVID-19 declaration under Section 564(b)(1) of the Act, 21 U.S.C. section 360-bbb-3(b)(1), unless the authorization is terminated or revoked sooner.  Performed at [facility], [place] [address], [place], [place] [address]     Labs: CBC: Recent Labs  Lab 09/29/22 1325 09/30/22 0332  WBC 8.4 6.4  HGB 14.2 13.9  HCT 42.7 40.3  MCV 97.0 94.6  PLT 156 0000000   Basic Metabolic Panel: Recent Labs  Lab 09/29/22 1325 09/30/22 0454 10/01/22 0159 10/02/22 0109 10/03/22 0041  NA 134* 135 133* 135 134*  K 3.8 2.9* 4.2 4.2 3.7  CL 93* 98 97* 99 98  CO2 27 28 26 27 26   GLUCOSE 97 132* 107* 102* 114*  BUN 17 16 18 18 19   CREATININE 1.20 1.15 1.23 1.33* 1.32*  CALCIUM 9.4 8.8* 9.2 9.4 8.7*  MG  --  1.8 2.0  --   --    Liver Function Tests: Recent Labs  Lab 09/29/22 2350 09/30/22 0454  AST 31 27  ALT 18 18  ALKPHOS 69 65  BILITOT 3.2* 2.9*  PROT 6.5 6.1*  ALBUMIN 3.5 3.3*   CBG: No results for input(s): "GLUCAP" in the last 168 hours.  Discharge time spent: greater than 30 minutes.  Signed: Tawni Millers, MD [facility] 10/03/2022

## 2022-10-03 NOTE — Progress Notes (Signed)
Mobility Specialist - Progress Note   10/03/22 1019  Mobility  Activity Ambulated with assistance in hallway  Level of Assistance Standby assist, set-up cues, supervision of patient - no hands on  Assistive Device None  Distance Ambulated (ft) 400 ft  Activity Response Tolerated well  Mobility Referral Yes  $Mobility charge 1 Mobility   Pt was received in bed and agreeable to mobility. No complaints throughout. Pt was left in bed with all needs met.  Larey Seat

## 2022-10-04 ENCOUNTER — Telehealth: Payer: Self-pay

## 2022-10-04 NOTE — Telephone Encounter (Signed)
Transition Care Management Unsuccessful Follow-up Telephone Call  Date of discharge and from where:  10/03/2022, [facility]  Attempts:  1st Attempt  Reason for unsuccessful TCM follow-up call:  Unable to leave message on [phone], voicemail not set up.  I also called. [phone] and the recording stated that the number is not in service.  The patient has a hospital follow up appointment scheduled at [facility] - 11/08/2022 with Epimenio Sarin, PA

## 2022-10-05 ENCOUNTER — Telehealth: Payer: Self-pay

## 2022-10-05 NOTE — Telephone Encounter (Signed)
Transition Care Management Unsuccessful Follow-up Telephone Call  Date of discharge and from where:  10/03/2022 [facility]  Attempts:  2nd Attempt  Reason for unsuccessful TCM follow-up call:  Unable to reach patient  on [phone], voicemail not set up.  I also called. [phone] and the recording stated that the number is not in service.   The patient has a hospital follow up appointment scheduled at [facility] - 11/08/2022 with Epimenio Sarin, PA

## 2022-10-06 ENCOUNTER — Telehealth (HOSPITAL_COMMUNITY): Payer: Self-pay

## 2022-10-06 NOTE — Telephone Encounter (Signed)
Called and patient's number was invalid and was unable to confirm/remind patient of their appointment at the [facility] on 10/09/22.

## 2022-10-09 ENCOUNTER — Encounter (HOSPITAL_COMMUNITY): Payer: Medicaid Other

## 2022-10-09 ENCOUNTER — Telehealth (HOSPITAL_COMMUNITY): Payer: Self-pay | Admitting: Licensed Clinical Social Worker

## 2022-10-09 ENCOUNTER — Telehealth: Payer: Self-pay

## 2022-10-09 NOTE — Telephone Encounter (Signed)
Transition Care Management Unsuccessful Follow-up Telephone Call  Date of discharge and from where:  10/03/2022, [facility]  Attempts:  3rd Attempt  Reason for unsuccessful TCM follow-up call:  Unable to reach patient - on [phone], voicemail not set up.  I also called. [phone] and the recording stated that the number is not in service.   The patient has a hospital follow up appointment scheduled at [facility] - 11/08/2022 with Epimenio Sarin, PA

## 2022-10-09 NOTE — Telephone Encounter (Signed)
H&V Care Navigation CSW Progress Note  Clinical Social Worker contacted by pt to discuss ride to appt. Pt has appt this afternoon but has not set up ride.  Appt canceled and rescheduled for tomorrow- ride arranged through [facility] transport.  Ride set for 2:15-2:45pm pick up tomorrow from home Ride ID: [id number] Pick up from clinic set for 4:30pm  Patient is participating in a Managed Medicaid Plan:  Yes  [place]: No Food Insecurity (10/02/2022)  Recent Concern: [place] Present (09/08/2022)  Housing: Low Risk  (10/02/2022)  Transportation Needs: Unmet Transportation Needs (10/09/2022)  Utilities: Not At Risk (10/02/2022)  Depression (PHQ2-9): Low Risk  (07/06/2020)  Financial Resource Strain: Low Risk  (10/02/2022)  Tobacco Use: Low Risk  (09/30/2022)    [name], [facility] Desk#: [phone] Cell#: [phone]

## 2022-10-10 ENCOUNTER — Encounter (HOSPITAL_COMMUNITY): Payer: Self-pay

## 2022-10-10 ENCOUNTER — Ambulatory Visit (HOSPITAL_COMMUNITY)
Admission: RE | Admit: 2022-10-10 | Discharge: 2022-10-10 | Disposition: A | Discharge status: Home / Self Care | Payer: Medicaid Other | Source: Ambulatory Visit | Attending: Family Medicine | Admitting: Family Medicine

## 2022-10-10 VITALS — BP 92/62 | HR 82 | Wt 147.0 lb

## 2022-10-10 DIAGNOSIS — Z01818 Encounter for other preprocedural examination: Secondary | ICD-10-CM | POA: Diagnosis not present

## 2022-10-10 DIAGNOSIS — I2510 Atherosclerotic heart disease of native coronary artery without angina pectoris: Secondary | ICD-10-CM

## 2022-10-10 DIAGNOSIS — Z91199 Patient's noncompliance with other medical treatment and regimen due to unspecified reason: Secondary | ICD-10-CM

## 2022-10-10 DIAGNOSIS — F1010 Alcohol abuse, uncomplicated: Secondary | ICD-10-CM

## 2022-10-10 DIAGNOSIS — R569 Unspecified convulsions: Secondary | ICD-10-CM | POA: Diagnosis not present

## 2022-10-10 DIAGNOSIS — I5022 Chronic systolic (congestive) heart failure: Secondary | ICD-10-CM | POA: Insufficient documentation

## 2022-10-10 LAB — BASIC METABOLIC PANEL
Anion gap: 8 (ref 5–15)
BUN: 14 mg/dL (ref 6–20)
CO2: 24 mmol/L (ref 22–32)
Calcium: 9 mg/dL (ref 8.9–10.3)
Chloride: 106 mmol/L (ref 98–111)
Creatinine, Ser: 1.03 mg/dL (ref 0.61–1.24)
GFR, Estimated: >60 mL/min (ref >60)
Glucose, Bld: 82 mg/dL (ref 70–99)
Potassium: 4.9 mmol/L (ref 3.5–5.1)
Sodium: 138 mmol/L (ref 135–145)

## 2022-10-10 LAB — DIGOXIN LEVEL: Digoxin Level: 1.3 ng/mL (ref 0.8–2.0)

## 2022-10-10 LAB — BRAIN NATRIURETIC PEPTIDE: B Natriuretic Peptide: 1919.9 pg/mL — ABNORMAL HIGH (ref 0.0–100.0)

## 2022-10-10 MED ORDER — METOLAZONE 2.5 MG PO TABS
2.5000 mg | ORAL_TABLET | Freq: Once | ORAL | Status: AC
  Administered 2022-10-10: 2.5 mg via ORAL

## 2022-10-10 MED ORDER — POTASSIUM CHLORIDE CRYS ER 20 MEQ PO TBCR
40.0000 meq | EXTENDED_RELEASE_TABLET | Freq: Once | ORAL | Status: AC
  Administered 2022-10-10: 40 meq via ORAL

## 2022-10-10 NOTE — Patient Instructions (Signed)
TAKE Metolazone 2.5 mg as soon as you get home TAKE Potassium 40 meq as soon as you get home  Labs today We will only contact you if something comes back abnormal or we need to make some changes. Otherwise no news is good news!   Your physician recommends that you schedule a follow-up appointment in: 2-3 weeks  in the Advanced Practitioners (PA/NP) Clinic    Do the following things EVERYDAY: Weigh yourself in the morning before breakfast. Write it down and keep it in a log. Take your medicines as prescribed Eat low salt foods--Limit salt (sodium) to 2000 mg per day.  Stay as active as you can everyday Limit all fluids for the day to less than 2 liters   At the [facility], you and your health needs are our priority. As part of our continuing mission to provide you with exceptional heart care, we have created designated Provider Care Teams. These Care Teams include your primary Cardiologist (physician) and Advanced Practice Providers (APPs- Physician Assistants and Nurse Practitioners) who all work together to provide you with the care you need, when you need it.   You may see any of the following providers on your designated Care Team at your next follow up: Dr Glori Bickers Dr Loralie Champagne Dr. Roxana Hires, NP Lyda Jester, [place] [facility] Dewy Rose, [place] Forestine Na, NP Audry Riles, PharmD   Please be sure to bring in all your medications bottles to every appointment.

## 2022-10-10 NOTE — Addendum Note (Signed)
Encounter addended by: Rockwell Alexandria, CMA on: 10/10/2022 4:12 PM  Actions taken: Clinical Note Signed

## 2022-10-10 NOTE — Progress Notes (Signed)
ReDS Vest / Clip - 10/10/22 1600       ReDS Vest / Clip   Station Marker C    Ruler Value 28    ReDS Value Range High volume overload    ReDS Actual Value 44

## 2022-10-10 NOTE — Progress Notes (Signed)
Advanced Heart Failure Clinic Note  PCP: Bertram Denver, NP HF Cardiologist: Dr Gala Romney   Reason for Visit: F/u for chronic systolic heart failure   HPI: [name] is a 61 y.o. male with h/o ETOH abuse, VF Arrest 2018, CAD s/p CABG 12/07/17, and ICM with EF 25-30%.   Admitted 12/18 with VF arrest while chopping wood. Received CPR in the field. R/LHC with severe 3vD as below and cardiogenic shock requiring IABP and milrinone. Required milrinone with continued shock. Pt improved and IABP removed 12/10, but pt had recurrent VF arrest so IABP replaced. Stabilized and underwent CABG 12/07/17. Tolerated gradual wean of meds and extubation with no further events.  Echo 04/2019  LVEF at 25%.    9/20 implantation of a [employer] single chamber ICD.   He had routine clinic f/u on 10/23/19 and while in exam room had a witnessed seizure  with loss of pulse. CPR started with quick ROSC. He did not require shock. Taken to the ED and had another seizure. Neurology consulted. Started on Keppra. ICD interrogated. No arrhythmias noted. Admitted to [facility] on 12/09/19 with recurrent seizures in setting of noncompliance with his Keppra.    Admitted 8/21 with COVID PNA.   Follow up 5/22 he was having facial and arm tingling, no other neuro symptoms. Ethanol level 145. Carvedilol increased and carotid u/s ordered showing bilateral ICA 1-39%.  Follow up 9/22, he was off all meds. Resumed meds and re-enrolled with Paramedicine. Follow up 10/22, he remained off all meds, NYHA II. Meds restarted, but lost to follow up.   Follow up 09/11/22, off all meds except Keppra, felt bad and volume overloaded.  HL score 61. ReDs 48%. No VT on device interrogation. He was given IV Lasix + dose of metolazone in clinic and instructed to restart PO Lasix next day. BP was too soft to restart other GDMT. Follow up, remained volume overloaded with NYHA IIIb-IV symptoms.  Admitted to a/c  CHF. Diuresed with IV lasix, discharged home, weight 138 lbs.  Today he returns for post hospitalization HF follow up. Overall feeling poorly. He is SOB with minimal activity. Has some dizziness. Denies CP, palpitations, edema, or PND/Orthopnea. Appetite ok. No fever or chills. He does not weigh at home. Taking all medications. Last drank ETOH 4 days ago.    ROS: All systems negative except as listed in HPI, PMH and Problem List.  SH:  Social History   Socioeconomic History   Marital status: Divorced    Spouse name: Not on file   Number of children: Not on file   Years of education: Not on file   Highest education level: Not on file  Occupational History   Not on file  Tobacco Use   Smoking status: Never   Smokeless tobacco: Never  Vaping Use   Vaping Use: Never used  Substance and Sexual Activity   Alcohol use: Yes    Alcohol/week: 14.0 standard drinks of alcohol    Types: 14 Cans of beer per week    Comment: patient endorses at least 1-2 beers daily (04/2020)   Drug use: No   Sexual activity: Not Currently  Other Topics Concern   Not on file  Social History Narrative   Lives at home with his brother who is crippled. He takes care of his brother.    Right handed  Social Determinants of Health   Financial Resource Strain: Low Risk  (10/02/2022)   Overall Financial Resource Strain (CARDIA)    Difficulty of Paying Living Expenses: Not very hard  Food Insecurity: No Food Insecurity (10/02/2022)   Hunger Vital Sign    Worried About Running Out of Food in the Last Year: Never true    Ran Out of Food in the Last Year: Never true  Recent Concern: Food Insecurity - Food Insecurity Present (09/08/2022)   Hunger Vital Sign    Worried About Running Out of Food in the Last Year: Often true    Ran Out of Food in the Last Year: Often true  Transportation Needs: Unmet Transportation Needs (10/09/2022)   PRAPARE - Administrator, Civil Service (Medical): Yes    Lack of  Transportation (Non-Medical): Yes  Physical Activity: Not on file  Stress: Not on file  Social Connections: Not on file  Intimate Partner Violence: Not on file   FH:  Family History  Problem Relation Age of Onset   Heart attack Other    Diabetes Neg Hx    Past Medical History:  Diagnosis Date   AICD (automatic cardioverter/defibrillator) present    Alcohol abuse 05/18/2020   Arthritis    "hands; maybe my toes" (12/20/2017)   CAD (coronary artery disease)    Cardiac arrest (HCC) 11/29/2017   hx VF arrest/notes 12/20/2017   CHF (congestive heart failure) (HCC)    Chronic systolic heart failure (HCC) 08/25/2019   Coronary artery disease    Coronary artery disease involving native coronary artery of native heart without angina pectoris 12/07/2017   Essential hypertension 05/18/2020   GERD (gastroesophageal reflux disease)    H/O ETOH abuse    /notes 12/20/2017   High cholesterol    Hypertension    Ischemic cardiomyopathy    a. 08/2019 s/p BSX D232 single lead AICD.   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) 12/30/2019   Syncope and collapse    "last few days" (12/20/2017)   Current Outpatient Medications  Medication Sig Dispense Refill   aspirin EC 81 MG tablet Take 1 tablet (81 mg total) by mouth daily. Swallow whole. 30 tablet 12   atorvastatin (LIPITOR) 80 MG tablet Take 1 tablet (80 mg total) by mouth daily. 30 tablet 0   carvedilol (COREG) 3.125 MG tablet Take 1 tablet (3.125 mg total) by mouth 2 (two) times daily with a meal. 60 tablet 0   digoxin (LANOXIN) 0.125 MG tablet Take 1 tablet (0.125 mg total) by mouth daily. 30 tablet 0   empagliflozin (JARDIANCE) 10 MG TABS tablet Take 1 tablet (10 mg total) by mouth daily. 30 tablet 0   furosemide (LASIX) 40 MG tablet  Take 40 mg by mouth daily.     levETIRAcetam (KEPPRA) 500 MG tablet TAKE 1 TABLET BY MOUTH 2 TIMES DAILY 180 tablet 3   potassium chloride SA (KLOR-CON M) 20 MEQ tablet Take 2 tablets (40 mEq total) by mouth daily. 60 tablet 0   No current facility-administered medications for this encounter.   BP 92/62   Pulse 82   Wt 66.7 kg (147 lb)   SpO2 96%   BMI 21.09 kg/m   Wt Readings from Last 3 Encounters:  10/10/22 66.7 kg (147 lb)  10/03/22 62.9 kg (138 lb 10.7 oz)  09/29/22 66.4 kg (146 lb 6.4 oz)   PHYSICAL EXAM: General:  NAD. No resp difficulty, fatigued appearing, walked into clinic. HEENT: Normal Neck: Supple. JVP to ear, + v waves. Carotids 2+ bilat; no bruits. No lymphadenopathy or thryomegaly appreciated. Cor: PMI nondisplaced. Regular rate & rhythm. No rubs, gallops, 3/6 TR Lungs: Diminished BS Abdomen: Soft, nontender, nondistended. No hepatosplenomegaly. No bruits or masses. Good bowel sounds. Extremities: No cyanosis, clubbing, rash, edema Neuro: Alert & oriented x 3, cranial nerves grossly intact. Moves all 4 extremities w/o difficulty. Affect pleasant.  Device interrogation: HL Score 45, 1.3 hrs/day of activity, no VT, average HR 94 bpm (personally reviewed).  ECG (personally reviewed): NSR w/ IVCD QRS 144 msec  REDs: 44%  ASSESSMENT & PLAN: 1. Chronic Biventricular HF, ICM  - Echo 11/30/17 25-30% RV normal.   - Echo 03/2018: EF 40-45%  RV normal.  - Echo 5/20 EF ~ 25%. Has ICD.  - Echo 12/2019 EF 30-35% RV normal. - Mixed Ischemic + probable ETOH mediated component  - Echo (10/23): biventricular failure getting worse from LVEF 30-35% Mod RV--> LVEF < 20% severely reduced RV. - Has [employer] ICD.  - NYHA IIIb. Volume back up on exam and device. Weight up 9 lbs from discharge. ReDs 44% - GDMT has been limited given compliance and hypotension. Will try to keep regimen simple.  - Take metolazone 2.5 mg + 40 KCL today. - Continue Lasix 40 mg daily + 40 KCL  daily. - Continue Coreg 3.125 mg bid. - Continue digoxin 0.125 mg daily.   - Continue Jardiance 10 mg daily.  - No BP to add ARB/ARNi today. - No spiro with h/o gynecomastia. Could consider Inspra but I am concerned about his follow up.  - Not a candidate for advanced therapies/transplant with ongoing ETOH/substance abuse and compliance (this includes home inotrope).  - Labs today. Repeat BMET 7-10 days.   2. CAD - S/P CABG x 3 2018  - No chest pain - Continue aspirin + atorvastatin.    3. Seizure Disorder - On Keppra.    4. ETOH Abuse - Discussed cessation.   5. SDOH - Very difficult to manage with ongoing substance abuse and noncompliance.  - Have not been able to enroll in Paramedicine, the team has been unable to maintain contact with him.  - Send refills of all HF meds to [facility] in [name].   Follow up in 1-2 weeks with APP.  Allena Katz, FNP-BC 10/10/22

## 2022-10-10 NOTE — Addendum Note (Signed)
Encounter addended by: Rockwell Alexandria, CMA on: 10/10/2022 4:14 PM  Actions taken: Flowsheet accepted, Clinical Note Signed, Charge Capture section accepted

## 2022-10-10 NOTE — Progress Notes (Signed)
Medication Samples have been provided to the patient.  Drug name: KCL       Strength: 66meq        Qty: 2  LOT: [id number]  Exp.Date: 03/25/23  Dosing instructions: Take 2 tablets by mouth today.  Medication Samples have been provided to the patient.  Drug name: Metoltzone       Strength: 2.5 mg        Qty: 1  LOT: [id number]  Exp.Date: 3/24  Dosing instructions: Take 1 tablet by mouth today.  The patient has been instructed regarding the correct time, dose, and frequency of taking this medication, including desired effects and most common side effects.   [name] 4:11 PM 10/10/2022   The patient has been instructed regarding the correct time, dose, and frequency of taking this medication, including desired effects and most common side effects.   [name] 4:08 PM 10/10/2022

## 2022-10-11 ENCOUNTER — Telehealth (HOSPITAL_COMMUNITY): Payer: Self-pay | Admitting: Licensed Clinical Social Worker

## 2022-10-11 NOTE — Telephone Encounter (Signed)
H&V Care Navigation CSW Progress Note  Clinical Social Worker assisted in setting up rides to upcoming follow up appts through pt Medicaid transportation benefit.  Appt 10/24 with HF Clinic: Pick up from home 8:15-8:45am Pick up from appt scheduled for 10:30-11am Ride ID: [id number]  Appt 11/15 with CHW: Pick up from home 12:25-1:05pm Pick up from appt 2:50-3:20pm Ride ID: [id number]  Patient is participating in a Managed Medicaid Plan:  Yes  [name]: No Food Insecurity (10/02/2022)  Recent Concern: [place] Present (09/08/2022)  Housing: Low Risk  (10/02/2022)  Transportation Needs: Unmet Transportation Needs (10/11/2022)  Utilities: Not At Risk (10/02/2022)  Depression (PHQ2-9): Low Risk  (07/06/2020)  Financial Resource Strain: Low Risk  (10/02/2022)  Tobacco Use: Low Risk  (10/10/2022)     [name], [place] Social Worker [facility] [facility] Desk#: [phone] Cell#: [phone]

## 2022-10-16 ENCOUNTER — Telehealth (HOSPITAL_COMMUNITY): Payer: Self-pay

## 2022-10-16 NOTE — Telephone Encounter (Signed)
Called to confirm/remind patient of their appointment at the Advanced Heart Failure Clinic on 10/17/22.   Patient reminded to bring all medications and/or complete list.  Confirmed patient has transportation. Gave directions, instructed to utilize valet parking.  Confirmed appointment prior to ending call.   

## 2022-10-17 ENCOUNTER — Ambulatory Visit (HOSPITAL_COMMUNITY)
Admission: RE | Admit: 2022-10-17 | Discharge: 2022-10-17 | Disposition: A | Discharge status: Home / Self Care | Payer: Medicaid Other | Source: Ambulatory Visit | Attending: Family Medicine | Admitting: Family Medicine

## 2022-10-17 ENCOUNTER — Encounter (HOSPITAL_COMMUNITY)
Admission: RE | Admit: 2022-10-17 | Discharge: 2022-10-17 | Disposition: A | Discharge status: Home / Self Care | Payer: Medicaid Other | Source: Ambulatory Visit | Attending: Internal Medicine | Admitting: Internal Medicine

## 2022-10-17 ENCOUNTER — Encounter (HOSPITAL_COMMUNITY): Payer: Self-pay

## 2022-10-17 ENCOUNTER — Other Ambulatory Visit (HOSPITAL_COMMUNITY): Payer: Self-pay | Admitting: *Deleted

## 2022-10-17 VITALS — BP 90/60 | HR 94 | Wt 147.4 lb

## 2022-10-17 DIAGNOSIS — I5022 Chronic systolic (congestive) heart failure: Secondary | ICD-10-CM | POA: Insufficient documentation

## 2022-10-17 DIAGNOSIS — Z951 Presence of aortocoronary bypass graft: Secondary | ICD-10-CM | POA: Insufficient documentation

## 2022-10-17 DIAGNOSIS — I110 Hypertensive heart disease with heart failure: Secondary | ICD-10-CM | POA: Diagnosis not present

## 2022-10-17 DIAGNOSIS — G40909 Epilepsy, unspecified, not intractable, without status epilepticus: Secondary | ICD-10-CM | POA: Insufficient documentation

## 2022-10-17 DIAGNOSIS — Z79899 Other long term (current) drug therapy: Secondary | ICD-10-CM | POA: Insufficient documentation

## 2022-10-17 DIAGNOSIS — I959 Hypotension, unspecified: Secondary | ICD-10-CM | POA: Diagnosis not present

## 2022-10-17 DIAGNOSIS — Z7984 Long term (current) use of oral hypoglycemic drugs: Secondary | ICD-10-CM | POA: Insufficient documentation

## 2022-10-17 DIAGNOSIS — R42 Dizziness and giddiness: Secondary | ICD-10-CM | POA: Insufficient documentation

## 2022-10-17 DIAGNOSIS — I5023 Acute on chronic systolic (congestive) heart failure: Secondary | ICD-10-CM | POA: Diagnosis not present

## 2022-10-17 DIAGNOSIS — Z91199 Patient's noncompliance with other medical treatment and regimen due to unspecified reason: Secondary | ICD-10-CM | POA: Diagnosis not present

## 2022-10-17 DIAGNOSIS — Z7982 Long term (current) use of aspirin: Secondary | ICD-10-CM | POA: Diagnosis not present

## 2022-10-17 DIAGNOSIS — F1010 Alcohol abuse, uncomplicated: Secondary | ICD-10-CM | POA: Diagnosis not present

## 2022-10-17 DIAGNOSIS — R569 Unspecified convulsions: Secondary | ICD-10-CM

## 2022-10-17 DIAGNOSIS — I2510 Atherosclerotic heart disease of native coronary artery without angina pectoris: Secondary | ICD-10-CM | POA: Diagnosis not present

## 2022-10-17 DIAGNOSIS — I5082 Biventricular heart failure: Secondary | ICD-10-CM | POA: Insufficient documentation

## 2022-10-17 DIAGNOSIS — Z8674 Personal history of sudden cardiac arrest: Secondary | ICD-10-CM | POA: Insufficient documentation

## 2022-10-17 LAB — BASIC METABOLIC PANEL
Anion gap: 8 (ref 5–15)
BUN: 9 mg/dL (ref 6–20)
CO2: 23 mmol/L (ref 22–32)
Calcium: 8.8 mg/dL — ABNORMAL LOW (ref 8.9–10.3)
Chloride: 106 mmol/L (ref 98–111)
Creatinine, Ser: 0.99 mg/dL (ref 0.61–1.24)
GFR, Estimated: >60 mL/min (ref >60)
Glucose, Bld: 80 mg/dL (ref 70–99)
Potassium: 3.9 mmol/L (ref 3.5–5.1)
Sodium: 137 mmol/L (ref 135–145)

## 2022-10-17 LAB — BRAIN NATRIURETIC PEPTIDE: B Natriuretic Peptide: 1887.8 pg/mL — ABNORMAL HIGH (ref 0.0–100.0)

## 2022-10-17 MED ORDER — POTASSIUM CHLORIDE CRYS ER 20 MEQ PO TBCR: 40.0000 meq | EXTENDED_RELEASE_TABLET | Freq: Once | ORAL | Status: DC

## 2022-10-17 MED ORDER — FUROSEMIDE 40 MG PO TABS: 40.0000 mg | ORAL_TABLET | Freq: Every day | ORAL | 3 refills | Status: DC

## 2022-10-17 MED ORDER — FUROSEMIDE 10 MG/ML IJ SOLN: 80.0000 mg | Freq: Once | INTRAMUSCULAR | Status: DC

## 2022-10-17 MED ORDER — FUROSEMIDE 10 MG/ML IJ SOLN: 80.0000 mg | Freq: Once | INTRAMUSCULAR | Status: AC

## 2022-10-17 MED ORDER — FUROSEMIDE 10 MG/ML IJ SOLN
INTRAMUSCULAR | Status: AC
  Administered 2022-10-17: 80 mg via INTRAVENOUS
  Filled 2022-10-17: qty 8

## 2022-10-17 NOTE — Progress Notes (Signed)
H&V Care Navigation CSW Progress Note  Clinical Social Worker met with pt to check in during appt at physician request as patient has not been taking medications and would benefit from reenrollment in paramedicine.  CSW awaiting determination if pt can be seen by paramedics due to patient being [facility]- will follow up with pt after hearing back from paramedic.  While talking pt reported food insecurity- still does not have EBT card back- CSW left VM again for pt case worker to check in regarding status of this request for new card.  CSW provided pt with Heart and Vascular Food bag to help with immediate food insecurity.  Patient is participating in a Managed Medicaid Plan:  Yes  [place]: Food Insecurity Present (10/17/2022)  Housing: Low Risk  (10/02/2022)  Transportation Needs: Unmet Transportation Needs (10/11/2022)  Utilities: Not At Risk (10/02/2022)  Depression (PHQ2-9): Low Risk  (07/06/2020)  Financial Resource Strain: Low Risk  (10/02/2022)  Tobacco Use: Low Risk  (10/17/2022)   [name], [place] [facility] Desk#: [phone] Cell#: [phone]

## 2022-10-17 NOTE — Progress Notes (Signed)
ReDS Vest / Clip - 10/17/22 1000       ReDS Vest / Clip   Station Marker C    Ruler Value 28    ReDS Value Range High volume overload    ReDS Actual Value 44

## 2022-10-17 NOTE — Progress Notes (Signed)
Advanced Heart Failure Clinic Note  PCP: Geryl Rankins, NP HF Cardiologist: Dr Haroldine Laws   Reason for Visit: F/u for chronic systolic heart failure   HPI: [name] is a 61 y.o. male with h/o ETOH abuse, VF Arrest 2018, CAD s/p CABG 12/07/17, and ICM with EF 25-30%.   Admitted 12/18 with VF arrest while chopping wood. Received CPR in the field. R/LHC with severe 3vD as below and cardiogenic shock requiring IABP and milrinone. Required milrinone with continued shock. Pt improved and IABP removed 12/10, but pt had recurrent VF arrest so IABP replaced. Stabilized and underwent CABG 12/07/17. Tolerated gradual wean of meds and extubation with no further events.  Echo 04/2019  LVEF at 25%.    9/20 implantation of a [employer] single chamber ICD.   He had routine clinic f/u on 10/23/19 and while in exam room had a witnessed seizure  with loss of pulse. CPR started with quick ROSC. He did not require shock. Taken to the ED and had another seizure. Neurology consulted. Started on Keppra. ICD interrogated. No arrhythmias noted. Admitted to [facility] on 12/09/19 with recurrent seizures in setting of noncompliance with his Keppra.    Admitted 8/21 with COVID PNA.   Follow up 5/22 he was having facial and arm tingling, no other neuro symptoms. Ethanol level 145. Carvedilol increased and carotid u/s ordered showing bilateral ICA 1-39%.  Follow up 9/22, he was off all meds. Resumed meds and re-enrolled with Paramedicine. Follow up 10/22, he remained off all meds, NYHA II. Meds restarted, but lost to follow up.   Follow up 09/11/22, off all meds except Keppra, felt bad and volume overloaded.  HL score 61. ReDs 48%. No VT on device interrogation. He was given IV Lasix + dose of metolazone in clinic and instructed to restart PO Lasix next day. BP was too soft to restart other GDMT. Follow up, remained volume overloaded with NYHA IIIb-IV symptoms.  Admitted to a/c  CHF. Diuresed with IV lasix, discharged home, weight 138 lbs.  Today he returns for HF follow up. Last week I saw him and he was volume overloaded. Given metolazone 2.5/40 KCL in clinic. Today, overall feeling fair. He is SOB walking on flat ground. Has not been taking Lasix at home. Having some dizziness, no falls. Denies palpitations, CP, edema, or PND. 3-4 pillow orthopnea. Appetite fair. No fever or chills. Weight at home 139 pounds 2 days ago on home scale. Drank a margarita 2-3 days ago.  ROS: All systems negative except as listed in HPI, PMH and Problem List.  SH:  Social History   Socioeconomic History   Marital status: Divorced    Spouse name: Not on file   Number of children: Not on file   Years of education: Not on file   Highest education level: Not on file  Occupational History   Not on file  Tobacco Use   Smoking status: Never   Smokeless tobacco: Never  Vaping Use   Vaping Use: Never used  Substance and Sexual Activity   Alcohol use: Yes    Alcohol/week: 14.0 standard drinks of alcohol    Types: 14 Cans of beer per week    Comment: patient endorses at least 1-2 beers daily (04/2020)   Drug use: No   Sexual activity: Not Currently  Other Topics Concern   Not on file  Social History Narrative   Lives at home with his brother who is crippled. He takes care of his brother.    Right handed    Social Determinants of Health   Financial Resource Strain: Low Risk  (10/02/2022)   Overall Financial Resource Strain (CARDIA)    Difficulty of Paying Living Expenses: Not very hard  Food Insecurity: No Food Insecurity (10/02/2022)   Hunger Vital Sign    Worried About Running Out of Food in the Last Year: Never true    Ran Out of Food in the Last Year: Never true  Recent Concern: Food Insecurity - Food Insecurity Present (09/08/2022)   Hunger Vital Sign    Worried About Running Out of Food in the Last Year: Often true    Ran Out of Food in the Last Year: Often true   Transportation Needs: Unmet Transportation Needs (10/11/2022)   PRAPARE - Administrator, Civil Service (Medical): Yes    Lack of Transportation (Non-Medical): Yes  Physical Activity: Not on file  Stress: Not on file  Social Connections: Not on file  Intimate Partner Violence: Not on file   FH:  Family History  Problem Relation Age of Onset   Heart attack Other    Diabetes Neg Hx    Past Medical History:  Diagnosis Date   AICD (automatic cardioverter/defibrillator) present    Alcohol abuse 05/18/2020   Arthritis    "hands; maybe my toes" (12/20/2017)   CAD (coronary artery disease)    Cardiac arrest (HCC) 11/29/2017   hx VF arrest/notes 12/20/2017   CHF (congestive heart failure) (HCC)    Chronic systolic heart failure (HCC) 08/25/2019   Coronary artery disease    Coronary artery disease involving native coronary artery of native heart without angina pectoris 12/07/2017   Essential hypertension 05/18/2020   GERD (gastroesophageal reflux disease)    H/O ETOH abuse    /notes 12/20/2017   High cholesterol    Hypertension    Ischemic cardiomyopathy    a. 08/2019 s/p BSX D232 single lead AICD.   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) 12/30/2019   Syncope and collapse    "last few days" (12/20/2017)   Current Outpatient Medications  Medication Sig Dispense Refill   aspirin EC 81 MG tablet Take 1 tablet (81 mg total) by mouth daily. Swallow whole. 30 tablet 12   atorvastatin (LIPITOR) 80 MG tablet Take 1 tablet (80 mg total) by mouth daily. 30 tablet 0   carvedilol (COREG) 3.125 MG tablet Take 1 tablet (3.125 mg total) by mouth 2 (two) times daily with a meal. 60 tablet 0   digoxin (LANOXIN) 0.125 MG tablet Take 1 tablet (0.125 mg total) by mouth daily. 30  tablet 0   empagliflozin (JARDIANCE) 10 MG TABS tablet Take 1 tablet (10 mg total) by mouth daily. 30 tablet 0   potassium chloride SA (KLOR-CON M) 20 MEQ tablet Take 2 tablets (40 mEq total) by mouth daily. 60 tablet 0   furosemide (LASIX) 40 MG tablet Take 40 mg by mouth daily. (Patient not taking: Reported on 10/17/2022)     levETIRAcetam (KEPPRA) 500 MG tablet TAKE 1 TABLET BY MOUTH 2 TIMES DAILY (Patient not taking: Reported on 10/17/2022) 180 tablet 3   No current facility-administered medications for  this encounter.   BP 90/60   Pulse 94   Wt 66.9 kg (147 lb 6.4 oz)   SpO2 96%   BMI 21.15 kg/m   Wt Readings from Last 3 Encounters:  10/17/22 66.9 kg (147 lb 6.4 oz)  10/10/22 66.7 kg (147 lb)  10/03/22 62.9 kg (138 lb 10.7 oz)   PHYSICAL EXAM: General:  NAD. No resp difficulty, walked into clinic, fatigued appearing. HEENT: Normal Neck: Supple. JVP to ear, + v waves. Carotids 2+ bilat; no bruits. No lymphadenopathy or thryomegaly appreciated. Cor: PMI nondisplaced. Regular rate & rhythm. No rubs, gallops or murmurs. Lungs: Basilar cracklers Abdomen: + distended, nontended. No hepatosplenomegaly. No bruits or masses. Good bowel sounds. Extremities: No cyanosis, clubbing, rash, 1+ pretibial BLE edema Neuro: Alert & oriented x 3, cranial nerves grossly intact. Moves all 4 extremities w/o difficulty. Affect pleasant.  Device interrogation: unable to interrogate device in clinic today.  ReDs: 44%  ASSESSMENT & PLAN: 1. Acute on chronic Biventricular HF, ICM  - Echo 11/30/17 25-30% RV normal.   - Echo 03/2018: EF 40-45%  RV normal.  - Echo 5/20 EF ~ 25%. Has ICD.  - Echo 12/2019 EF 30-35% RV normal. - Mixed Ischemic + probable ETOH mediated component  - Echo (10/23): biventricular failure getting worse from LVEF 30-35% Mod RV--> LVEF < 20% severely reduced RV. - Has [employer] ICD.  - NYHA IIIb. Volume up, ReDs 44% - GDMT has been limited given compliance and  hypotension. Will try to keep regimen simple.  - Stop digoxin. Dig level 1.2 at last visit. Unclear if this was a trough, but will medication complaince issues, I am worried about safety. - Arrange for Lasix 80 mg IV x 1 today in infusion clinic + 40 KCL - Restart Lasix 40 mg daily tomorrow (10/18/22).  - Continue Coreg 3.125 mg bid. - Continue Jardiance 10 mg daily.  - No BP to add ARB/ARNi today. - No spiro with h/o gynecomastia. Could consider Inspra but I am concerned about his follow up.  - Not a candidate for advanced therapies/transplant with ongoing ETOH/substance abuse and compliance (this includes home inotrope).  - Labs today.   2. CAD - S/P CABG x 3 2018  - No chest pain - Continue aspirin + atorvastatin.    3. Seizure Disorder - On Keppra.    4. ETOH Abuse - Discussed cessation.   5. SDOH - Very difficult to manage with ongoing substance abuse and noncompliance.  - Have not been able to enroll in Paramedicine, the team has been unable to maintain contact with him.  - Engage HFSW, imperative he has help with his medications. This is the only way we are going to be able to keep him out of the hospital. - Need to start thinking about GOC. ?palliative referral.   Follow up in 1-2 weeks with APP.  Prince Rome, FNP-BC 10/17/22

## 2022-10-17 NOTE — Patient Instructions (Addendum)
STOP Digoxin  RESTART Lasix 40 mg daily one tab daily on 10/18/2022  Labs today We will only contact you if something comes back abnormal or we need to make some changes. Otherwise no news is good news!  Your physician recommends that you schedule a follow-up appointment in: 2 weeks  in the Advanced Practitioners (PA/NP) Clinic     Do the following things EVERYDAY: Weigh yourself in the morning before breakfast. Write it down and keep it in a log. Take your medicines as prescribed Eat low salt foods--Limit salt (sodium) to 2000 mg per day.  Stay as active as you can everyday Limit all fluids for the day to less than 2 liters

## 2022-10-20 ENCOUNTER — Telehealth (HOSPITAL_COMMUNITY): Payer: Self-pay | Admitting: Licensed Clinical Social Worker

## 2022-10-20 NOTE — Telephone Encounter (Signed)
H&V Care Navigation CSW Progress Note  Clinical Social Worker working on pt transportation to upcoming appt and also help with getting in home assistance.    CSW arranged ride through [facility] Medicaid to appt on 11/9:  Pick up from home 10:45am-11:15am Pick up from appt 12pm Ride ID: [id number]  Pt unable to be added to paramedicine due to location so referring to [facility] services to hopefully provide in home aid assistance.  Patient is participating in a Managed Medicaid Plan:  Yes  [place]: Food Insecurity Present (10/17/2022)  Housing: Low Risk  (10/02/2022)  Transportation Needs: Unmet Transportation Needs (10/20/2022)  Utilities: Not At Risk (10/02/2022)  Depression (PHQ2-9): Low Risk  (07/06/2020)  Financial Resource Strain: Low Risk  (10/02/2022)  Tobacco Use: Low Risk  (10/17/2022)   [name], [place] [facility] Desk#: [phone] Cell#: [phone]

## 2022-10-23 ENCOUNTER — Other Ambulatory Visit: Payer: Self-pay

## 2022-10-23 ENCOUNTER — Other Ambulatory Visit (HOSPITAL_COMMUNITY): Payer: Self-pay | Admitting: *Deleted

## 2022-10-23 MED ORDER — ASPIRIN 81 MG PO TBEC: 81.0000 mg | DELAYED_RELEASE_TABLET | Freq: Every day | ORAL | 11 refills | Status: DC

## 2022-10-23 MED ORDER — POTASSIUM CHLORIDE CRYS ER 20 MEQ PO TBCR: 40.0000 meq | EXTENDED_RELEASE_TABLET | Freq: Every day | ORAL | 11 refills | Status: DC

## 2022-10-23 MED ORDER — CARVEDILOL 3.125 MG PO TABS: 3.1250 mg | ORAL_TABLET | Freq: Two times a day (BID) | ORAL | 11 refills | Status: DC

## 2022-10-23 MED ORDER — EMPAGLIFLOZIN 10 MG PO TABS: 10.0000 mg | ORAL_TABLET | Freq: Every day | ORAL | 11 refills | Status: DC

## 2022-10-23 MED ORDER — FUROSEMIDE 40 MG PO TABS: 40.0000 mg | ORAL_TABLET | Freq: Every day | ORAL | 3 refills | Status: DC

## 2022-10-23 MED ORDER — ATORVASTATIN CALCIUM 80 MG PO TABS: 80.0000 mg | ORAL_TABLET | Freq: Every day | ORAL | 11 refills | Status: DC

## 2022-10-26 ENCOUNTER — Telehealth (HOSPITAL_COMMUNITY): Payer: Self-pay | Admitting: Licensed Clinical Social Worker

## 2022-10-26 NOTE — Telephone Encounter (Signed)
H&V Care Navigation CSW Progress Note  Clinical Social Worker contacted [employer] and set pt up to get bubble packs of medication delivered to his home.  Attempted to inform pt of this but phone currently not working- will continue to attempt.  Patient is participating in a Managed Medicaid Plan:  Yes  [place]: Food Insecurity Present (10/17/2022)  Housing: Low Risk  (10/02/2022)  Transportation Needs: Unmet Transportation Needs (10/20/2022)  Utilities: Not At Risk (10/02/2022)  Depression (PHQ2-9): Low Risk  (07/06/2020)  Financial Resource Strain: Low Risk  (10/02/2022)  Tobacco Use: Low Risk  (10/17/2022)    [name], [place] [facility] Desk#: [phone] Cell#: [phone]

## 2022-10-27 ENCOUNTER — Telehealth (HOSPITAL_COMMUNITY): Payer: Self-pay | Admitting: Licensed Clinical Social Worker

## 2022-10-27 NOTE — Telephone Encounter (Signed)
CSW attempted to call pt again- phone going to error message.  CSW created letter with pt upcoming appt dates as well as transportation details and sent to patient address.  Will continue to attempt contact   [name], [place] [facility] Desk#: [phone] Cell#: [phone]

## 2022-11-01 ENCOUNTER — Telehealth (HOSPITAL_COMMUNITY): Payer: Self-pay

## 2022-11-01 NOTE — Telephone Encounter (Signed)
Called and was unable to leave patient a voice message to confirm/remind patient of their appointment at the [facility] on 11/02/22.    

## 2022-11-01 NOTE — Progress Notes (Incomplete)
[facility] Note  PCP: Bertram Denver, NP HF Cardiologist: Dr Gala Romney   Reason for Visit: F/u for chronic systolic heart failure   HPI: [name] is a 61 y.o. male with h/o ETOH abuse, VF Arrest 2018, CAD s/p CABG 12/07/17, and ICM with EF 25-30%.   Admitted 12/18 with VF arrest while chopping wood. Received CPR in the field. R/LHC with severe 3vD as below and cardiogenic shock requiring IABP and milrinone. Required milrinone with continued shock. Pt improved and IABP removed 12/10, but pt had recurrent VF arrest so IABP replaced. Stabilized and underwent CABG 12/07/17. Tolerated gradual wean of meds and extubation with no further events.  Echo 04/2019  LVEF at 25%.    9/20 implantation of a [employer] single chamber ICD.   He had routine clinic f/u on 10/23/19 and while in exam room had a witnessed seizure  with loss of pulse. CPR started with quick ROSC. He did not require shock. Taken to the ED and had another seizure. Neurology consulted. Started on Keppra. ICD interrogated. No arrhythmias noted. Admitted to [facility] on 12/09/19 with recurrent seizures in setting of noncompliance with his Keppra.    Admitted 8/21 with COVID PNA.   Follow up 5/22 he was having facial and arm tingling, no other neuro symptoms. Ethanol level 145. Carvedilol increased and carotid u/s ordered showing bilateral ICA 1-39%.  Follow up 9/22, he was off all meds. Resumed meds and re-enrolled with Paramedicine. Follow up 10/22, he remained off all meds, NYHA II. Meds restarted, but lost to follow up.   Follow up 09/11/22, off all meds except Keppra, felt bad and volume overloaded.  HL score 61. ReDs 48%. No VT on device interrogation. He was given IV Lasix + dose of metolazone in clinic and instructed to restart PO Lasix next day. BP was too soft to restart other GDMT. Follow up, remained volume overloaded with NYHA IIIb-IV symptoms.  Admitted d/t  a/c CHF. Diuresed with IV lasix, discharged home, weight 138 lbs.  Seen for f/u 10/24. He was volume overloaded. ReDS 44% . He was given IV lasix in the clinic. Social worker has been attempting to contact him to assist with transportation and home care but unable to reach him. Here today for close f/u.    ROS: All systems negative except as listed in HPI, PMH and Problem List.  SH:  Social History   Socioeconomic History   Marital status: Divorced    Spouse name: Not on file   Number of children: Not on file   Years of education: Not on file   Highest education level: Not on file  Occupational History   Not on file  Tobacco Use   Smoking status: Never   Smokeless tobacco: Never  Vaping Use   Vaping Use: Never used  Substance and Sexual Activity   Alcohol use: Yes    Alcohol/week: 14.0 standard drinks of alcohol    Types: 14 Cans of beer per week    Comment: patient endorses at least 1-2 beers daily (04/2020)   Drug use: No   Sexual activity: Not Currently  Other Topics Concern   Not on file  Social History Narrative   Lives at home with his brother who is crippled. He takes care of his brother.    Right handed    Social Determinants of Health  Financial Resource Strain: Low Risk  (10/02/2022)   Overall Financial Resource Strain (CARDIA)    Difficulty of Paying Living Expenses: Not very hard  Food Insecurity: Food Insecurity Present (10/17/2022)   Hunger Vital Sign    Worried About Running Out of Food in the Last Year: Often true    Ran Out of Food in the Last Year: Often true  Transportation Needs: Unmet Transportation Needs (10/20/2022)   PRAPARE - Administrator, Civil Service (Medical): Yes    Lack of Transportation (Non-Medical): Yes  Physical Activity: Not on file  Stress: Not on file  Social Connections: Not on file  Intimate Partner Violence: Not on file   FH:  Family History  Problem Relation Age of Onset   Heart attack Other    Diabetes  Neg Hx    Past Medical History:  Diagnosis Date   AICD (automatic cardioverter/defibrillator) present    Alcohol abuse 05/18/2020   Arthritis    "hands; maybe my toes" (12/20/2017)   CAD (coronary artery disease)    Cardiac arrest (HCC) 11/29/2017   hx VF arrest/notes 12/20/2017   CHF (congestive heart failure) (HCC)    Chronic systolic heart failure (HCC) 08/25/2019   Coronary artery disease    Coronary artery disease involving native coronary artery of native heart without angina pectoris 12/07/2017   Essential hypertension 05/18/2020   GERD (gastroesophageal reflux disease)    H/O ETOH abuse    /notes 12/20/2017   High cholesterol    Hypertension    Ischemic cardiomyopathy    a. 08/2019 s/p BSX D232 single lead AICD.   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) 12/30/2019   Syncope and collapse    "last few days" (12/20/2017)   Current Outpatient Medications  Medication Sig Dispense Refill   aspirin EC 81 MG tablet Take 1 tablet (81 mg total) by mouth daily. Swallow whole. 30 tablet 11   atorvastatin (LIPITOR) 80 MG tablet Take 1 tablet (80 mg total) by mouth daily. 30 tablet 11   carvedilol (COREG) 3.125 MG tablet Take 1 tablet (3.125 mg total) by mouth 2 (two) times daily with a meal. 60 tablet 11   empagliflozin (JARDIANCE) 10 MG TABS tablet Take 1 tablet (10 mg total) by mouth daily. 30 tablet 11   furosemide (LASIX) 40 MG tablet Take 1 tablet (40 mg total) by mouth daily. 30 tablet 3   levETIRAcetam (KEPPRA) 500 MG tablet TAKE 1 TABLET BY MOUTH 2 TIMES DAILY (Patient not taking: Reported on 10/17/2022) 180 tablet 3   potassium chloride SA (KLOR-CON M) 20 MEQ tablet Take 2 tablets (40 mEq total) by mouth daily. 60 tablet 11   No current facility-administered medications  for this visit.   There were no vitals taken for this visit.  Wt Readings from Last 3 Encounters:  10/17/22 66.9 kg (147 lb 6.4 oz)  10/10/22 66.7 kg (147 lb)  10/03/22 62.9 kg (138 lb 10.7 oz)   PHYSICAL EXAM: General:  NAD. No resp difficulty, fatigued appearing, walked into clinic. HEENT: Normal Neck: Supple. JVP to ear, + v waves. Carotids 2+ bilat; no bruits. No lymphadenopathy or thryomegaly appreciated. Cor: PMI nondisplaced. Regular rate & rhythm. No rubs, gallops, 3/6 TR Lungs: Diminished BS Abdomen: Soft, nontender, nondistended. No hepatosplenomegaly. No  bruits or masses. Good bowel sounds. Extremities: No cyanosis, clubbing, rash, edema Neuro: Alert & oriented x 3, cranial nerves grossly intact. Moves all 4 extremities w/o difficulty. Affect pleasant.  Device interrogation: ****  ECG (personally reviewed): NSR w/ IVCD QRS 144 msec  REDs:***  ASSESSMENT & PLAN: 1. Chronic Biventricular HF, ICM  - Echo 11/30/17 25-30% RV normal.   - Echo 03/2018: EF 40-45%  RV normal.  - Echo 5/20 EF ~ 25%. Has ICD.  - Echo 12/2019 EF 30-35% RV normal. - Mixed Ischemic + probable ETOH mediated component  - Echo (10/23): biventricular failure getting worse from LVEF 30-35% Mod RV--> LVEF < 20% severely reduced RV. - Has [employer] ICD.  - NYHA IIIb. Volume *** - GDMT has been limited given compliance and hypotension. Will try to keep regimen simple.  - Now off digoxin d/t elevated level. - Continue Coreg 3.125 mg bid. - Continue digoxin 0.125 mg daily.   - Continue Jardiance 10 mg daily.  - No BP to add ARB/ARNi today. - No spiro with h/o gynecomastia. Could consider Inspra but I am concerned about his follow up.  - Not a candidate for advanced therapies/transplant with ongoing ETOH/substance abuse and compliance (this includes home inotrope).  - Labs today.   2. CAD - S/P CABG x 3 2018  - No chest pain - Continue aspirin + atorvastatin.    3. Seizure Disorder - On  Keppra.    4. ETOH Abuse - Discussed cessation.   5. SDOH - Very difficult to manage with ongoing substance abuse and noncompliance.  - Have not been able to enroll in Paramedicine, the team has been unable to maintain contact with him.  - Send refills of all HF meds [facility] [name].   Follow up ***  [facility], FNP-BC 11/01/22

## 2022-11-02 ENCOUNTER — Encounter (HOSPITAL_COMMUNITY): Payer: Self-pay

## 2022-11-02 ENCOUNTER — Ambulatory Visit (HOSPITAL_COMMUNITY)
Admission: RE | Admit: 2022-11-02 | Discharge: 2022-11-02 | Disposition: A | Discharge status: Home / Self Care | Payer: Medicaid Other | Source: Ambulatory Visit | Attending: Physician Assistant | Admitting: Physician Assistant

## 2022-11-02 VITALS — BP 108/68 | HR 91 | Wt 139.8 lb

## 2022-11-02 DIAGNOSIS — I5082 Biventricular heart failure: Secondary | ICD-10-CM | POA: Diagnosis not present

## 2022-11-02 DIAGNOSIS — I110 Hypertensive heart disease with heart failure: Secondary | ICD-10-CM | POA: Insufficient documentation

## 2022-11-02 DIAGNOSIS — I2510 Atherosclerotic heart disease of native coronary artery without angina pectoris: Secondary | ICD-10-CM | POA: Insufficient documentation

## 2022-11-02 DIAGNOSIS — I959 Hypotension, unspecified: Secondary | ICD-10-CM | POA: Insufficient documentation

## 2022-11-02 DIAGNOSIS — Z7982 Long term (current) use of aspirin: Secondary | ICD-10-CM | POA: Diagnosis not present

## 2022-11-02 DIAGNOSIS — G40909 Epilepsy, unspecified, not intractable, without status epilepticus: Secondary | ICD-10-CM | POA: Diagnosis not present

## 2022-11-02 DIAGNOSIS — Z91199 Patient's noncompliance with other medical treatment and regimen due to unspecified reason: Secondary | ICD-10-CM | POA: Diagnosis not present

## 2022-11-02 DIAGNOSIS — Z7984 Long term (current) use of oral hypoglycemic drugs: Secondary | ICD-10-CM | POA: Diagnosis not present

## 2022-11-02 DIAGNOSIS — F1010 Alcohol abuse, uncomplicated: Secondary | ICD-10-CM | POA: Insufficient documentation

## 2022-11-02 DIAGNOSIS — Z8674 Personal history of sudden cardiac arrest: Secondary | ICD-10-CM | POA: Diagnosis not present

## 2022-11-02 DIAGNOSIS — Z79899 Other long term (current) drug therapy: Secondary | ICD-10-CM | POA: Diagnosis not present

## 2022-11-02 DIAGNOSIS — I5022 Chronic systolic (congestive) heart failure: Secondary | ICD-10-CM | POA: Insufficient documentation

## 2022-11-02 DIAGNOSIS — Z951 Presence of aortocoronary bypass graft: Secondary | ICD-10-CM | POA: Insufficient documentation

## 2022-11-02 DIAGNOSIS — I255 Ischemic cardiomyopathy: Secondary | ICD-10-CM

## 2022-11-02 LAB — BASIC METABOLIC PANEL
Anion gap: 11 (ref 5–15)
BUN: 12 mg/dL (ref 6–20)
CO2: 26 mmol/L (ref 22–32)
Calcium: 9.2 mg/dL (ref 8.9–10.3)
Chloride: 99 mmol/L (ref 98–111)
Creatinine, Ser: 1.02 mg/dL (ref 0.61–1.24)
GFR, Estimated: >60 mL/min (ref >60)
Glucose, Bld: 86 mg/dL (ref 70–99)
Potassium: 3.6 mmol/L (ref 3.5–5.1)
Sodium: 136 mmol/L (ref 135–145)

## 2022-11-02 LAB — BRAIN NATRIURETIC PEPTIDE: B Natriuretic Peptide: 1029.7 pg/mL — ABNORMAL HIGH (ref 0.0–100.0)

## 2022-11-02 MED ORDER — METOPROLOL SUCCINATE ER 25 MG PO TB24: 12.5000 mg | ORAL_TABLET | Freq: Every day | ORAL | 7 refills | Status: DC

## 2022-11-02 MED ORDER — ATORVASTATIN CALCIUM 80 MG PO TABS: 80.0000 mg | ORAL_TABLET | Freq: Every morning | ORAL | 11 refills | Status: DC

## 2022-11-02 MED ORDER — LOSARTAN POTASSIUM 25 MG PO TABS: 12.5000 mg | ORAL_TABLET | Freq: Every day | ORAL | 3 refills | Status: DC

## 2022-11-02 MED ORDER — SPIRONOLACTONE 25 MG PO TABS: 12.5000 mg | ORAL_TABLET | Freq: Every day | ORAL | 3 refills | Status: DC

## 2022-11-02 NOTE — Progress Notes (Signed)
ReDS Vest / Clip - 11/02/22 1230       ReDS Vest / Clip   Station Marker C    Ruler Value 28    ReDS Value Range Low volume    ReDS Actual Value 33    Anatomical Comments sitting

## 2022-11-02 NOTE — Patient Instructions (Signed)
Thank you for coming in today  Labs were done today, if any labs are abnormal the clinic will call you No news is good news  STOP Coreg START Toprol XL to 12.5 1/2 tablet daily START Spironolactone 12.5 1/2 tablet daily   Your physician recommends that you return for lab work in:  BMET get drawn next week at PCP office   Your physician recommends that you schedule a follow-up appointment in:  6 weeks in clinic   At the [facility], you and your health needs are our priority. As part of our continuing mission to provide you with exceptional heart care, we have created designated Provider Care Teams. These Care Teams include your primary Cardiologist (physician) and Advanced Practice Providers (APPs- Physician Assistants and Nurse Practitioners) who all work together to provide you with the care you need, when you need it.   You may see any of the following providers on your designated Care Team at your next follow up: Dr Arvilla Meres Dr Marca Ancona Dr. Marcos Eke, NP Robbie Lis, [place] [facility] [place], [place] Brynda Peon, NP Karle Plumber, PharmD   Please be sure to bring in all your medications bottles to every appointment.   If you have any questions or concerns before your next appointment please send [place] a message through [place] or call our office at [phone].    TO LEAVE A MESSAGE FOR THE NURSE SELECT OPTION 2, PLEASE LEAVE A MESSAGE INCLUDING: YOUR NAME DATE OF BIRTH CALL BACK NUMBER REASON FOR CALL**this is important as we prioritize the call backs  YOU WILL RECEIVE A CALL BACK THE SAME DAY AS LONG AS YOU CALL BEFORE 4:00 PM

## 2022-11-06 ENCOUNTER — Telehealth (HOSPITAL_COMMUNITY): Payer: Self-pay | Admitting: Licensed Clinical Social Worker

## 2022-11-06 NOTE — Telephone Encounter (Signed)
H&V Care Navigation CSW Progress Note  Clinical Social Worker received call from pt that his phone was back on.  CSW helped pt contact [facility] caseworker who had tried to call him to set up home visit for Thursday to assess for PCS.  CSW reminded pt of PCP appt on Wednesday- ride already set up and ride info in notes and in appt notes.  Patient is participating in a Managed Medicaid Plan:  Yes  SDOH Screenings   Food Insecurity: Food Insecurity Present (10/17/2022)  Housing: Low Risk  (10/02/2022)  Transportation Needs: Unmet Transportation Needs (10/20/2022)  Utilities: Not At Risk (10/02/2022)  Depression (PHQ2-9): Low Risk  (07/06/2020)  Financial Resource Strain: Low Risk  (10/02/2022)  Tobacco Use: Low Risk  (11/02/2022)    Burna Sis, LCSW Clinical Social Worker [facility] Desk#: [phone] Cell#: [phone]

## 2022-11-06 NOTE — Telephone Encounter (Signed)
Entered in error

## 2022-11-08 ENCOUNTER — Encounter: Payer: Self-pay | Admitting: Physician Assistant

## 2022-11-08 ENCOUNTER — Ambulatory Visit: Payer: Medicaid Other | Attending: Physician Assistant | Admitting: Physician Assistant

## 2022-11-08 VITALS — BP 117/80 | HR 91 | Ht 70.0 in | Wt 139.2 lb

## 2022-11-08 DIAGNOSIS — L089 Local infection of the skin and subcutaneous tissue, unspecified: Secondary | ICD-10-CM

## 2022-11-08 DIAGNOSIS — F1010 Alcohol abuse, uncomplicated: Secondary | ICD-10-CM | POA: Diagnosis not present

## 2022-11-08 DIAGNOSIS — I5022 Chronic systolic (congestive) heart failure: Secondary | ICD-10-CM | POA: Diagnosis not present

## 2022-11-08 DIAGNOSIS — Z09 Encounter for follow-up examination after completed treatment for conditions other than malignant neoplasm: Secondary | ICD-10-CM

## 2022-11-08 DIAGNOSIS — I2510 Atherosclerotic heart disease of native coronary artery without angina pectoris: Secondary | ICD-10-CM

## 2022-11-08 NOTE — Patient Instructions (Signed)
[place].org is the website for narcotics anonymous  [email or web address].cy (website) or 2202581981 is the information for alcoholics anonymous  Both are free and immediately available for help with alcohol and drug use Schedule attached to your after visit summary

## 2022-11-08 NOTE — Progress Notes (Signed)
Patient ID: [name], male   DOB: [birth year], 61 y.o.   MRN: [id number]    [name], is a 61 y.o. male  CWU:[id number]  TUU:[id number]  DOB - Dec 22, 1961  Chief Complaint  Patient presents with   Hospitalization Follow-up       Subjective:   [name] is a 61 y.o. male here today for a follow up visit After hospitalization for CHF.  He has had f/up with cardiology and his most recent visit was 11/02/2022 and A/P is below after discharge summary.  He denies CP.  He has all of his prescriptions and has multiple RF.  He denies CP/SOB.  Says he is only drinking 2 beers a day.  Feet are much improved.  From discharge summary: Recommendations at discharge:     Patient has been placed on digoxin and empagliflozin Decreased dose of carvedilol Continue with furosemide and Kcl supplements.  Follow up with cardiology as outpatient.    Discharge Diagnoses: Principal Problem:   Acute on chronic systolic CHF (congestive heart failure) ([name]) Active Problems:   Coronary artery disease   Essential hypertension   Mixed hyperlipidemia   Seizures ([place])   Alcohol abuse   Hypokalemia  Expand All Collapse All      Physician Discharge Summary    Patient: [name] MRN: [id number] DOB: 1961-09-05  Admit date:     09/29/2022  Discharge date: 10/03/22  Discharge Physician: Jimmy Picket Arrien    PCP: Gildardo Pounds, NP    Recommendations at discharge:     Patient has been placed on digoxin and empagliflozin Decreased dose of carvedilol Continue with furosemide and Kcl supplements.  Follow up with cardiology as outpatient.    Discharge Diagnoses: Principal Problem:   Acute on chronic systolic CHF (congestive heart failure) (HCC) Active Problems:   Coronary artery disease   Essential hypertension   Mixed hyperlipidemia   Seizures (HCC)   Alcohol abuse   Hypokalemia   Resolved Problems:   * No resolved hospital problems. [facility] Course: Mr.  [name] was admitted to the hospital with the working diagnosis of decompensated heart failure.    61 yo male with the past medical history of coronary artery disease sp CABG, heart failure, seizures and alcohol abuse who presented with dyspnea and fatigue. Patient had progressive dyspnea on exertion, getting symptomatic with minimal efforts. He was evaluated the heart failure clinical and was noted to be hypotensive and hypovolemic, and he was referred to the ED for further evaluation. On his initial physical examination in the ED his blood pressure was 130/97, HR 103, RR 14 and 02 saturation 98%, lungs with no wheezing, or rhonchi, heart with S1 and S2 present and rhythmic with no gallops, positive JVD, no abdominal distention and positive pitting bilateral lower extremity edema.      Assessment and Plan: * Acute on chronic systolic CHF (congestive heart failure) (HCC) Echocardiogram with LV systolic function <94%, global hypokinesis, abnormal paradoxical septal motion, severe reduction in RV systolic function, RV with severe dilatation, moderate to severe TR, RVSP 39,2 mmHg.    Patient as placed on IV furosemide, negative fluid balance was achieved, - 8,341 ml, with significant improvement in his symptoms.  Blood pressure stabilized, added empagliflozin, digoxin and resumed lower dose of carvedilol with good toleration.    Not on spironolactone due to history of gynecomastia  Patient will continue diuresis as outpatient with oral furosemide.  Follow up with heart failure clinic as outpatient.  Coronary artery disease SP CABG on 11/2017  Contin with aspirin and statin therapy.  No chest pain.    Essential hypertension Improved blood pressure, resumed carvedilol at a low dose.    Mixed hyperlipidemia Continue with statin therapy.    Seizures ([place]) Continue with keppra.    Alcohol abuse No signs of acute withdrawal. Alcohol cessation counseling.    Hypokalemia Hyponatremia.     From 11/02/2022 visit A/P: ASSESSMENT & PLAN: 1. Chronic Biventricular HF, ICM  - Echo 11/30/17 25-30% RV normal.   - Echo 03/2018: EF 40-45%  RV normal.  - Echo 5/20 EF ~ 25%. Has ICD.  - Echo 12/2019 EF 30-35% RV normal. - Mixed Ischemic + probable ETOH mediated component  - Echo (10/23): biventricular failure getting worse from LVEF 30-35% Mod RV--> LVEF < 20% severely reduced RV. - Has [employer] ICD.  - NYHA IIIb. Volume okay on exam and by ReDS which was 33% - GDMT has been limited given compliance and hypotension. Will try to keep regimen simple. Do not recommend BID meds. - Continue 40 mg lasix daily - Now off digoxin d/t elevated level. - Switch coreg to metoprolol xl 25 mg daily, has not been taking pm dose of coreg - Continue Jardiance 10 mg daily.  - Add Losartan 12.5 mg daily - No spiro with h/o gynecomastia. Could consider Inspra but I am concerned about his follow up.  - Not a candidate for advanced therapies/transplant with ongoing ETOH/substance abuse and compliance (this includes home inotrope).  - Labs today.   He met with Education officer, museum today. His pharmacy was contacted. Will be unable to update bubble packs with med changes for 3 weeks.   2. CAD - S/P CABG x 3 2018  - No chest pain - Continue aspirin + atorvastatin (switch to am d/t compliance)   3. Seizure Disorder - On Keppra.    4. ETOH Abuse - Discussed cessation.    5. Foot wounds - Instructed on cleansing wounds at home and provided supplies for dressing them - reassess at f/u with PCP next week   6. SDOH - Very difficult to manage with ongoing substance abuse and noncompliance.  - Have not been able to enroll in Paramedicine, the team has been unable to maintain contact with him.  - He has been followed by HF CSW [place]. Meds now bubble packed which seems to be helping with adherence. Also has transportation assistance. - He was gifted bag from HF food pantry  No problems  updated.  ALLERGIES: Allergies  Allergen Reactions   Aldactone [Spironolactone] Other (See Comments)    Gynecomastia     PAST MEDICAL HISTORY: Past Medical History:  Diagnosis Date   AICD (automatic cardioverter/defibrillator) present    Alcohol abuse 05/18/2020   Arthritis    "hands; maybe my toes" (12/20/2017)   CAD (coronary artery disease)    Cardiac arrest ([place]) 11/29/2017   hx VF arrest/notes 12/20/2017   CHF (congestive heart failure) (HCC)    Chronic systolic heart failure ([place]) 08/25/2019   Coronary artery disease    Coronary artery disease involving native coronary artery of native heart without angina pectoris 12/07/2017   Essential hypertension 05/18/2020   GERD (gastroesophageal reflux disease)    H/O ETOH abuse    /notes 12/20/2017   High cholesterol    Hypertension    Ischemic cardiomyopathy    a. 08/2019 s/p BSX D232 single lead AICD.   Mixed hyperlipidemia 05/18/2020   Seizure ([place]) 10/21/2019  Seizures ([place]) ~ 2016; ~ 2017   "I've had a couple; I was at work" (12/20/2017)   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, 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 ([place]) 12/30/2019   Syncope and collapse    "last few days" (12/20/2017)    MEDICATIONS AT HOME: Prior to Admission medications   Medication Sig Start Date End Date Taking? Authorizing Provider  aspirin EC 81 MG tablet Take 1 tablet (81 mg total) by mouth daily. Swallow whole. 10/23/22  Yes [place], Maricela Bo, FNP  atorvastatin (LIPITOR) 80 MG tablet Take 1 tablet (80 mg total) by mouth in the morning. 11/02/22  Yes Joette Catching, PA-C  empagliflozin (JARDIANCE) 10 MG TABS tablet Take 1 tablet (10 mg total) by mouth daily. 10/23/22  Yes Milford, Maricela Bo, FNP  furosemide (LASIX) 40 MG tablet Take 1 tablet (40 mg total) by mouth daily. 10/23/22  Yes Milford, Maricela Bo, FNP  levETIRAcetam (KEPPRA) 500 MG tablet TAKE 1 TABLET BY MOUTH 2 TIMES DAILY 01/04/21  Yes Melvenia Beam, MD   losartan (COZAAR) 25 MG tablet Take 0.5 tablets (12.5 mg total) by mouth daily. 11/02/22  Yes Joette Catching, PA-C  metoprolol succinate (TOPROL XL) 25 MG 24 hr tablet Take 0.5 tablets (12.5 mg total) by mouth daily. 11/02/22  Yes Joette Catching, PA-C  potassium chloride SA (KLOR-CON M) 20 MEQ tablet Take 2 tablets (40 mEq total) by mouth daily. 10/23/22  Yes [place], Maricela Bo, FNP    ROS: Neg HEENT Neg resp Neg GI Neg GU Neg MS Neg psych Neg neuro  Objective:   Vitals:   11/08/22 1412  BP: 117/80  Pulse: 91  SpO2: 98%  Weight: 139 lb 3.2 oz (63.1 kg)  Height: _0  (1.778 m)   Exam General appearance : Awake, alert, not in any distress. Garbled to slurred to clear. Seems sedated or impaired. Not toxic looking HEENT: Atraumatic and Normocephalic Neck: Supple, no JVD. No cervical lymphadenopathy.  Chest: fair air entry bilaterally, CTAB.  No rales/rhonchi/wheezing CVS: S1 S2 regular, no murmurs.  Extremities: B/L Lower Ext shows no edema, both legs are warm to touch Neurology: Awake, but seems impaired and oriented X 3, CN II-XII intact, Non focal Skin: No Rash Feet: poor cleaning.  Wounds healing/closing without any drainage or active infection  Data Review Lab Results  Component Value Date   HGBA1C 5.8 (H) 05/18/2020   HGBA1C 5.0 03/09/2020   HGBA1C 5.0 12/06/2017    Assessment & Plan   1. Coronary artery disease involving native coronary artery of native heart without angina pectoris Followed by cardiology.  Continue current regimen  2. Chronic systolic heart failure ([place]) Followed by cardiology.  Continue current regimen  3. Alcohol abuse [place].org is the website for narcotics anonymous [place] (website) or [phone] is the information for alcoholics anonymous Both are free and immediately available for help with alcohol and drug use I have counseled the patient at length about substance abuse and addiction.  12 step  meetings/recovery recommended.  Local 12 step meeting lists were given and attendance was encouraged.  Patient expresses understanding.   4. Foot infection Resolving-improved.  Continue ointment  5. Hospital discharge follow-up    Return in about 3 months (around 02/08/2023) for PCP for chronic conditions.  The patient was given clear instructions to go to ER or return to medical center if symptoms don't improve, worsen or new problems develop. The patient verbalized understanding. The patient was told to call to  get lab results if they haven't heard anything in the next week.      Freeman Caldron, PA-C [facility] and [place], [place]   11/08/2022, 2:22 PM

## 2022-11-22 ENCOUNTER — Telehealth (HOSPITAL_COMMUNITY): Payer: Self-pay | Admitting: Licensed Clinical Social Worker

## 2022-11-22 NOTE — Telephone Encounter (Signed)
H&V Care Navigation CSW Progress Note  Clinical Social Worker helped to arrange ride for pt for upcoming appt through his Medicaid transportation benefit:  Pick up from home: 10:15-10:45am Pick up from appt: 12:30pm-1pm Ride ID: [id number]  Patient is participating in a Managed Medicaid Plan:  Yes  SDOH Screenings   Food Insecurity: Food Insecurity Present (10/17/2022)  Housing: Low Risk  (10/02/2022)  Transportation Needs: Unmet Transportation Needs (10/20/2022)  Utilities: Not At Risk (10/02/2022)  Depression (PHQ2-9): Medium Risk (11/08/2022)  Financial Resource Strain: Low Risk  (10/02/2022)  Tobacco Use: Low Risk  (11/08/2022)     Burna Sis, LCSW Clinical Social Worker [facility] Desk#: [phone] Cell#: [phone]

## 2022-11-27 ENCOUNTER — Telehealth (HOSPITAL_COMMUNITY): Payer: Self-pay | Admitting: Licensed Clinical Social Worker

## 2022-11-27 NOTE — Telephone Encounter (Signed)
H&V Care Navigation CSW Progress Note  Clinical Social Worker called [facility] to have request pill packs for the next month be shipped out- spoke with pharmacist who is preparing the pill packs and will send out asap.  Patient is participating in a Managed Medicaid Plan:  Yes  SDOH Screenings   Food Insecurity: Food Insecurity Present (10/17/2022)  Housing: Low Risk  (10/02/2022)  Transportation Needs: Unmet Transportation Needs (10/20/2022)  Utilities: Not At Risk (10/02/2022)  Depression (PHQ2-9): Medium Risk (11/08/2022)  Financial Resource Strain: Low Risk  (10/02/2022)  Tobacco Use: Low Risk  (11/08/2022)    Burna Sis, LCSW Clinical Social Worker [facility] Desk#: [phone] Cell#: [phone]

## 2022-12-04 ENCOUNTER — Telehealth (HOSPITAL_COMMUNITY): Payer: Self-pay | Admitting: Licensed Clinical Social Worker

## 2022-12-04 DIAGNOSIS — M940 Chondrocostal junction syndrome [Tietze]: Secondary | ICD-10-CM | POA: Diagnosis not present

## 2022-12-04 DIAGNOSIS — R109 Unspecified abdominal pain: Secondary | ICD-10-CM | POA: Diagnosis not present

## 2022-12-04 DIAGNOSIS — I219 Acute myocardial infarction, unspecified: Secondary | ICD-10-CM | POA: Diagnosis not present

## 2022-12-04 DIAGNOSIS — I10 Essential (primary) hypertension: Secondary | ICD-10-CM | POA: Diagnosis not present

## 2022-12-04 DIAGNOSIS — R110 Nausea: Secondary | ICD-10-CM | POA: Diagnosis not present

## 2022-12-04 DIAGNOSIS — R079 Chest pain, unspecified: Secondary | ICD-10-CM | POA: Diagnosis not present

## 2022-12-04 DIAGNOSIS — R1084 Generalized abdominal pain: Secondary | ICD-10-CM | POA: Diagnosis not present

## 2022-12-04 DIAGNOSIS — I447 Left bundle-branch block, unspecified: Secondary | ICD-10-CM | POA: Diagnosis not present

## 2022-12-04 NOTE — Telephone Encounter (Signed)
H&V Care Navigation CSW Progress Note  Clinical Social Worker received call from pt requesting help getting home from [facility] ED.  States he went there due to swelling/discomfort but was not admitted and they can't help him get home.  CSW assisted in calling his Medicaid transport who will provide discharge transportation to him.  They will pick him up between 12:45-2pm. Ride ID: [phone]  Patient is participating in a Managed Medicaid Plan:  Yes  SDOH Screenings   Food Insecurity: Food Insecurity Present (10/17/2022)  Housing: Low Risk  (10/02/2022)  Transportation Needs: Unmet Transportation Needs (10/20/2022)  Utilities: Not At Risk (10/02/2022)  Depression (PHQ2-9): Medium Risk (11/08/2022)  Financial Resource Strain: Low Risk  (10/02/2022)  Tobacco Use: Low Risk  (11/08/2022)    Burna Sis, LCSW Clinical Social Worker Advanced [facility] Failure Clinic Desk#: [phone] Cell#: [phone]

## 2022-12-07 ENCOUNTER — Telehealth: Payer: Self-pay | Admitting: *Deleted

## 2022-12-07 NOTE — Patient Outreach (Signed)
  Care Coordination [facility] Note Transition Care Management Unsuccessful Follow-up Telephone Call  Date of discharge and from where:  12/04/22 from [facility] ED  Attempts:  1st Attempt  Reason for unsuccessful TCM follow-up call:  Unable to leave message   Estanislado Emms RN, BSN [place]  [facility] RN Care Coordinator

## 2022-12-11 NOTE — Progress Notes (Incomplete)
Advanced Heart Failure Clinic Note  PCP: Bertram Denver, NP HF Cardiologist: Dr Gala Romney   Reason for Visit: F/u for chronic systolic heart failure   HPI: [name] is a 61 y.o. male with h/o ETOH abuse, VF Arrest 2018, CAD s/p CABG 12/07/17, and ICM with EF 25-30%.   Admitted 12/18 with VF arrest while chopping wood. Received CPR in the field. R/LHC with severe 3vD as below and cardiogenic shock requiring IABP and milrinone. Required milrinone with continued shock. Pt improved and IABP removed 12/10, but pt had recurrent VF arrest so IABP replaced. Stabilized and underwent CABG 12/07/17. Tolerated gradual wean of meds and extubation with no further events.  Echo 04/2019  LVEF at 25%.    9/20 implantation of a [employer] single chamber ICD.   He had routine clinic f/u on 10/23/19 and while in exam room had a witnessed seizure  with loss of pulse. CPR started with quick ROSC. He did not require shock. Taken to the ED and had another seizure. Neurology consulted. Started on Keppra. ICD interrogated. No arrhythmias noted. Admitted to [facility] on 12/09/19 with recurrent seizures in setting of noncompliance with his Keppra.    Admitted 8/21 with COVID PNA.   Follow up 5/22 he was having facial and arm tingling, no other neuro symptoms. Ethanol level 145. Carvedilol increased and carotid u/s ordered showing bilateral ICA 1-39%.  Follow up 9/22, he was off all meds. Resumed meds and re-enrolled with Paramedicine. Follow up 10/22, he remained off all meds, NYHA II. Meds restarted, but lost to follow up.   Follow up 09/11/22, off all meds except Keppra, felt bad and volume overloaded.  HL score 61. ReDs 48%. No VT on device interrogation. He was given IV Lasix + dose of metolazone in clinic and instructed to restart PO Lasix next day. BP was too soft to restart other GDMT. Follow up, remained volume overloaded with NYHA IIIb-IV symptoms.  Admitted d/t  a/c CHF. Diuresed with IV lasix, discharged home, weight 138 lbs.  Seen for f/u 10/17/22. He was volume overloaded. ReDS 44% . He was given IV lasix in the clinic. Volume improved at follow up, REDs 33%.  Today he returns for HF follow up. Was seen in [place] ED last week for belly pain and swelling. Says they gave him an Advil and sent him home. No records to review. Overall feeling poorly. He is SOB with minimal exertion. Ankles are swelling. + cough. Has new abdominal bruising started a couple days ago, no trauma to area and did not fall. He is dizzy.  He has not had any ETOH x 1 week. Denies palpitations, CP, or PND/Orthopnea. No fever or chills. Weight at home 157 pounds. Taking all medications, has bubble packs. Drinking a lot of water, urinating a lot. No nausea, eating OK.  ROS: All systems negative except as listed in HPI, PMH and Problem List.  SH:  Social History   Socioeconomic History   Marital status: Divorced    Spouse name: Not on file   Number of children: Not on file   Years of education: Not on file   Highest education level: Not on file  Occupational History   Not on file  Tobacco Use   Smoking status: Never   Smokeless tobacco: Never  Vaping Use   Vaping Use: Never used  Substance and Sexual  Activity   Alcohol use: Yes    Alcohol/week: 14.0 standard drinks of alcohol    Types: 14 Cans of beer per week    Comment: patient endorses at least 1-2 beers daily (04/2020)   Drug use: No   Sexual activity: Not Currently  Other Topics Concern   Not on file  Social History Narrative   Lives at home with his brother who is crippled. He takes care of his brother.    Right handed    Social Determinants of Health   Financial Resource Strain: Low Risk  (10/02/2022)   Overall Financial Resource Strain (CARDIA)    Difficulty of Paying Living Expenses: Not very hard  Food Insecurity: Food Insecurity Present (10/17/2022)   Hunger Vital Sign    Worried About Running Out of  Food in the Last Year: Often true    Ran Out of Food in the Last Year: Often true  Transportation Needs: Unmet Transportation Needs (10/20/2022)   PRAPARE - Administrator, Civil Service (Medical): Yes    Lack of Transportation (Non-Medical): Yes  Physical Activity: Not on file  Stress: Not on file  Social Connections: Not on file  Intimate Partner Violence: Not on file   FH:  Family History  Problem Relation Age of Onset   Heart attack Other    Diabetes Neg Hx    Past Medical History:  Diagnosis Date   AICD (automatic cardioverter/defibrillator) present    Alcohol abuse 05/18/2020   Arthritis    "hands; maybe my toes" (12/20/2017)   CAD (coronary artery disease)    Cardiac arrest (HCC) 11/29/2017   hx VF arrest/notes 12/20/2017   CHF (congestive heart failure) (HCC)    Chronic systolic heart failure (HCC) 08/25/2019   Coronary artery disease    Coronary artery disease involving native coronary artery of native heart without angina pectoris 12/07/2017   Essential hypertension 05/18/2020   GERD (gastroesophageal reflux disease)    H/O ETOH abuse    /notes 12/20/2017   High cholesterol    Hypertension    Ischemic cardiomyopathy    a. 08/2019 s/p BSX D232 single lead AICD.   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) 12/30/2019   Syncope and collapse    "last few days" (12/20/2017)   Current Outpatient Medications  Medication Sig Dispense Refill   aspirin EC 81 MG tablet Take 1 tablet (81 mg total) by mouth daily. Swallow whole. 30 tablet 11   atorvastatin (LIPITOR) 80 MG tablet Take 1 tablet (80 mg total) by mouth in the morning. 30 tablet 11   empagliflozin (JARDIANCE) 10 MG TABS tablet Take 1 tablet (10 mg total) by mouth daily. 30 tablet 11   furosemide  (LASIX) 40 MG tablet Take 1 tablet (40 mg total) by mouth daily. 30 tablet 3   levETIRAcetam (KEPPRA) 500 MG tablet TAKE 1 TABLET BY MOUTH 2 TIMES DAILY 180 tablet 3   losartan (COZAAR) 25 MG tablet Take 0.5 tablets (12.5 mg total) by mouth daily. 90 tablet 3   metoprolol succinate (TOPROL XL) 25 MG 24 hr tablet Take 0.5 tablets (12.5 mg total) by mouth daily. 15 tablet 7   potassium chloride SA (KLOR-CON M) 20 MEQ tablet Take 2 tablets (40 mEq total) by mouth daily. 60 tablet 11  No current facility-administered medications for this encounter.   BP 94/68   Pulse 94   Wt 66.7 kg (147 lb)   SpO2 98%   BMI 21.09 kg/m   Wt Readings from Last 3 Encounters:  12/12/22 66.7 kg (147 lb)  11/08/22 63.1 kg (139 lb 3.2 oz)  11/02/22 63.4 kg (139 lb 12.8 oz)   PHYSICAL EXAM: General:  Mild conversational dyspnea, walked into clinic, chronically-ill appearing HEENT: Normal Neck: Supple. JVP to jaw. Carotids 2+ bilat; no bruits. No lymphadenopathy or thryomegaly appreciated. Cor: PMI nondisplaced. Regular rate & rhythm. No rubs, gallops or murmurs. Lungs: Faint crackles in bases Abdomen: + tender, + distended. No hepatosplenomegaly. No bruits or masses. Good bowel sounds. Scattered bruising to bilateral flanks and periumbilical area. Extremities: No cyanosis, clubbing, rash, edema Neuro: Alert & oriented x 3, cranial nerves grossly intact. Moves all 4 extremities w/o difficulty. Affect pleasant.  Device interrogation: HL Score 34, thoracic impedence down, 1 hr/day activity, average HR 104 bpm  ECG (personally reviewed): NSR 97 bpm, LBBB  ASSESSMENT & PLAN: 1. Acute on chronic Biventricular HF, ICM  - Echo 11/30/17 25-30% RV normal.   - Echo 03/2018: EF 40-45%  RV normal.  - Echo 5/20 EF ~ 25%. Has ICD.  - Echo 12/2019 EF 30-35% RV normal. - Mixed Ischemic + probable ETOH mediated component  - Echo (10/23): biventricular failure getting worse from LVEF 30-35% Mod RV--> LVEF < 20% severely  reduced RV. - Has [employer] ICD.  - NYHA IIIb. Volume up, weight up 8 lbs, HL Score 34 - GDMT has been limited given compliance and hypotension. Will try to keep regimen simple. Do not recommend BID meds. - Give Lasix 80 mg IV + 40 KCL today. - Tomorrow, restart Lasix 40 mg daily. - Continue Toprol XL 12.5 mg daily. - Continue Jardiance 10 mg daily.  - Continue losartan 12.5 mg daily - Now off digoxin d/t elevated level. - No spiro with h/o gynecomastia. Could consider Inspra but I am concerned about his follow up.  - Not a candidate for advanced therapies/transplant with ongoing ETOH/substance abuse and compliance (this includes home inotrope).  - Labs today. Repeat BMET/Mag at APP follow up next week or so.   2. CAD - S/P CABG x 3 2018  - No chest pain - Continue aspirin + atorvastatin.   3. Seizure Disorder - On Keppra.    4. ETOH Abuse - Has not drank in 1 week. - Discussed cessation.   5. Abdominal pain - Diffuse bruising on abdomen.  - Concern for pancreatitis, although no nausea and able to eat.  - Check LFTs, amylase, lipase and CBC.  6. SDOH - Very difficult to manage with ongoing substance abuse and noncompliance.  - Have not been able to enroll in Paramedicine, the team has been unable to maintain contact with him.  - He has been followed by HF CSW [place]. Meds now bubble packed which seems to be helping with adherence. Also has transportation assistance.  [place], he is high risk for re-admit. He has been requiring IV lasix every 4-6 weeks. He will get IV lasix in infusion clinic today + extra 40 KCL. May need admission, pending labs. If labs OK, follow up next week with APP.  Prince Rome, FNP-BC 12/12/22  Addendum: Labs OK today, BNP elevated as expected. Lipase and amylase elevated, but not markedly so. Tbili, direct and indirect bili remain elevated. Will refer to GI for further work up. Discussed ETOH  cessation.

## 2022-12-12 ENCOUNTER — Encounter (HOSPITAL_COMMUNITY)
Admission: RE | Admit: 2022-12-12 | Discharge: 2022-12-12 | Disposition: A | Discharge status: Home / Self Care | Payer: Medicaid Other | Source: Ambulatory Visit | Attending: Internal Medicine | Admitting: Internal Medicine

## 2022-12-12 ENCOUNTER — Encounter (HOSPITAL_COMMUNITY): Payer: Self-pay

## 2022-12-12 ENCOUNTER — Ambulatory Visit (HOSPITAL_COMMUNITY)
Admission: RE | Admit: 2022-12-12 | Discharge: 2022-12-12 | Disposition: A | Discharge status: Home / Self Care | Payer: Medicaid Other | Source: Ambulatory Visit | Attending: Family Medicine | Admitting: Family Medicine

## 2022-12-12 ENCOUNTER — Other Ambulatory Visit (HOSPITAL_COMMUNITY): Payer: Self-pay | Admitting: *Deleted

## 2022-12-12 VITALS — BP 94/68 | HR 94 | Wt 147.0 lb

## 2022-12-12 DIAGNOSIS — R0609 Other forms of dyspnea: Secondary | ICD-10-CM | POA: Insufficient documentation

## 2022-12-12 DIAGNOSIS — Z951 Presence of aortocoronary bypass graft: Secondary | ICD-10-CM | POA: Insufficient documentation

## 2022-12-12 DIAGNOSIS — I447 Left bundle-branch block, unspecified: Secondary | ICD-10-CM | POA: Diagnosis not present

## 2022-12-12 DIAGNOSIS — G40909 Epilepsy, unspecified, not intractable, without status epilepticus: Secondary | ICD-10-CM | POA: Insufficient documentation

## 2022-12-12 DIAGNOSIS — S301XXA Contusion of abdominal wall, initial encounter: Secondary | ICD-10-CM | POA: Diagnosis not present

## 2022-12-12 DIAGNOSIS — I959 Hypotension, unspecified: Secondary | ICD-10-CM | POA: Insufficient documentation

## 2022-12-12 DIAGNOSIS — Z95810 Presence of automatic (implantable) cardiac defibrillator: Secondary | ICD-10-CM | POA: Insufficient documentation

## 2022-12-12 DIAGNOSIS — I255 Ischemic cardiomyopathy: Secondary | ICD-10-CM | POA: Diagnosis not present

## 2022-12-12 DIAGNOSIS — Z91199 Patient's noncompliance with other medical treatment and regimen due to unspecified reason: Secondary | ICD-10-CM | POA: Diagnosis not present

## 2022-12-12 DIAGNOSIS — Z8674 Personal history of sudden cardiac arrest: Secondary | ICD-10-CM | POA: Diagnosis not present

## 2022-12-12 DIAGNOSIS — Z79899 Other long term (current) drug therapy: Secondary | ICD-10-CM | POA: Diagnosis not present

## 2022-12-12 DIAGNOSIS — Z139 Encounter for screening, unspecified: Secondary | ICD-10-CM

## 2022-12-12 DIAGNOSIS — F1010 Alcohol abuse, uncomplicated: Secondary | ICD-10-CM | POA: Diagnosis not present

## 2022-12-12 DIAGNOSIS — I5022 Chronic systolic (congestive) heart failure: Secondary | ICD-10-CM | POA: Insufficient documentation

## 2022-12-12 DIAGNOSIS — Z8616 Personal history of COVID-19: Secondary | ICD-10-CM | POA: Insufficient documentation

## 2022-12-12 DIAGNOSIS — X58XXXA Exposure to other specified factors, initial encounter: Secondary | ICD-10-CM | POA: Insufficient documentation

## 2022-12-12 DIAGNOSIS — R17 Unspecified jaundice: Secondary | ICD-10-CM

## 2022-12-12 DIAGNOSIS — Z7984 Long term (current) use of oral hypoglycemic drugs: Secondary | ICD-10-CM | POA: Insufficient documentation

## 2022-12-12 DIAGNOSIS — R109 Unspecified abdominal pain: Secondary | ICD-10-CM | POA: Insufficient documentation

## 2022-12-12 DIAGNOSIS — I5023 Acute on chronic systolic (congestive) heart failure: Secondary | ICD-10-CM | POA: Insufficient documentation

## 2022-12-12 DIAGNOSIS — I5082 Biventricular heart failure: Secondary | ICD-10-CM

## 2022-12-12 DIAGNOSIS — Z7982 Long term (current) use of aspirin: Secondary | ICD-10-CM | POA: Insufficient documentation

## 2022-12-12 DIAGNOSIS — I110 Hypertensive heart disease with heart failure: Secondary | ICD-10-CM | POA: Diagnosis not present

## 2022-12-12 DIAGNOSIS — I2510 Atherosclerotic heart disease of native coronary artery without angina pectoris: Secondary | ICD-10-CM | POA: Diagnosis not present

## 2022-12-12 DIAGNOSIS — R140 Abdominal distension (gaseous): Secondary | ICD-10-CM | POA: Diagnosis not present

## 2022-12-12 DIAGNOSIS — R0602 Shortness of breath: Secondary | ICD-10-CM | POA: Diagnosis not present

## 2022-12-12 DIAGNOSIS — R1084 Generalized abdominal pain: Secondary | ICD-10-CM

## 2022-12-12 DIAGNOSIS — R569 Unspecified convulsions: Secondary | ICD-10-CM

## 2022-12-12 LAB — HEPATIC FUNCTION PANEL
ALT: 39 U/L (ref 0–44)
AST: 57 U/L — ABNORMAL HIGH (ref 15–41)
Albumin: 3.7 g/dL (ref 3.5–5.0)
Alkaline Phosphatase: 105 U/L (ref 38–126)
Bilirubin, Direct: 0.9 mg/dL — ABNORMAL HIGH (ref 0.0–0.2)
Indirect Bilirubin: 2 mg/dL — ABNORMAL HIGH (ref 0.3–0.9)
Total Bilirubin: 2.9 mg/dL — ABNORMAL HIGH (ref 0.3–1.2)
Total Protein: 7.5 g/dL (ref 6.5–8.1)

## 2022-12-12 LAB — BASIC METABOLIC PANEL
Anion gap: 9 (ref 5–15)
BUN: 13 mg/dL (ref 6–20)
CO2: 27 mmol/L (ref 22–32)
Calcium: 9 mg/dL (ref 8.9–10.3)
Chloride: 100 mmol/L (ref 98–111)
Creatinine, Ser: 1.07 mg/dL (ref 0.61–1.24)
GFR, Estimated: >60 mL/min (ref >60)
Glucose, Bld: 74 mg/dL (ref 70–99)
Potassium: 3.8 mmol/L (ref 3.5–5.1)
Sodium: 136 mmol/L (ref 135–145)

## 2022-12-12 LAB — CBC
HCT: 42.1 % (ref 39.0–52.0)
Hemoglobin: 13.4 g/dL (ref 13.0–17.0)
MCH: 30.7 pg (ref 26.0–34.0)
MCHC: 31.8 g/dL (ref 30.0–36.0)
MCV: 96.6 fL (ref 80.0–100.0)
Platelets: 304 10*3/uL (ref 150–400)
RBC: 4.36 MIL/uL (ref 4.22–5.81)
RDW: 16.4 % — ABNORMAL HIGH (ref 11.5–15.5)
WBC: 9.8 10*3/uL (ref 4.0–10.5)
nRBC: 0 % (ref 0.0–0.2)

## 2022-12-12 LAB — LIPASE, BLOOD: Lipase: 71 U/L — ABNORMAL HIGH (ref 11–51)

## 2022-12-12 LAB — BRAIN NATRIURETIC PEPTIDE: B Natriuretic Peptide: 2491.3 pg/mL — ABNORMAL HIGH (ref 0.0–100.0)

## 2022-12-12 LAB — AMYLASE: Amylase: 54 U/L (ref 28–100)

## 2022-12-12 MED ORDER — FUROSEMIDE 10 MG/ML IJ SOLN: 80.0000 mg | Freq: Once | INTRAMUSCULAR | Status: DC

## 2022-12-12 MED ORDER — FUROSEMIDE 10 MG/ML IJ SOLN
80.0000 mg | Freq: Once | INTRAMUSCULAR | Status: AC
  Administered 2022-12-12: 80 mg via INTRAVENOUS

## 2022-12-12 MED ORDER — POTASSIUM CHLORIDE CRYS ER 20 MEQ PO TBCR
40.0000 meq | EXTENDED_RELEASE_TABLET | Freq: Once | ORAL | Status: AC
  Administered 2022-12-12: 40 meq via ORAL

## 2022-12-12 MED ORDER — POTASSIUM CHLORIDE CRYS ER 20 MEQ PO TBCR
EXTENDED_RELEASE_TABLET | ORAL | Status: AC
  Filled 2022-12-12: qty 2

## 2022-12-12 MED ORDER — FUROSEMIDE 10 MG/ML IJ SOLN
INTRAMUSCULAR | Status: AC
  Filled 2022-12-12: qty 8

## 2022-12-12 MED ORDER — POTASSIUM CHLORIDE CRYS ER 20 MEQ PO TBCR: 40.0000 meq | EXTENDED_RELEASE_TABLET | Freq: Once | ORAL | Status: DC

## 2022-12-12 NOTE — Patient Instructions (Addendum)
Return to Heart Failure APP Clinic in 2 weeks. Referral sent to [facility] for evaluation of elevated bilirubin and abdominal pain. They will call you with appointment; if you don't hear from them, call their office at: [phone] Please call Heart Failure Clinic with any questions or concerns at [phone]

## 2022-12-12 NOTE — Addendum Note (Signed)
Encounter addended by: Jacklynn Ganong, FNP on: 12/12/2022 3:01 PM  Actions taken: Clinical Note Signed

## 2022-12-12 NOTE — Addendum Note (Signed)
Encounter addended by: Marcy Siren, LCSW on: 12/12/2022 3:28 PM  Actions taken: Flowsheet accepted, Clinical Note Signed

## 2022-12-12 NOTE — Addendum Note (Signed)
Encounter addended by: Levonne Spiller, RN on: 12/12/2022 3:00 PM  Actions taken: Visit diagnoses modified, Order list changed, Diagnosis association updated, Clinical Note Signed

## 2022-12-12 NOTE — Addendum Note (Signed)
Encounter addended by: Jacklynn Ganong, FNP on: 12/12/2022 12:26 PM  Actions taken: Clinical Note Signed

## 2022-12-12 NOTE — Addendum Note (Signed)
Encounter addended by: Marcy Siren, LCSW on: 12/12/2022 4:55 PM  Actions taken: Flowsheet accepted, Clinical Note Signed

## 2022-12-12 NOTE — Progress Notes (Signed)
CSW requested to assist patient with a ride home form clinic. Patient scheduled for [place] ride via [place]. Waiver reviewed with patient. [name], LCSW, CCSW-MCS [phone]   12/12/2022  [name] DOB: [birth year] MRN: [id number]   RIDER WAIVER AND RELEASE OF LIABILITY  For the purposes of helping with transportation needs, [place] partners with outside transportation providers (taxi companies, [place], Catering manager.) to give [employer] patients or other approved people the choice of on-demand rides [employer]") to our buildings for non-emergency visits.  By using [employer], I, the person signing this document, on behalf of myself and/or any legal minors (in my care using the [employer]), agree:  Science writer given to me are supplied by independent, outside transportation providers who do not work for, or have any affiliation with, [employer]. [place] is not a transportation company. [place] has no control over the quality or safety of the rides I get using [employer]. [place] has no control over whether any outside ride will happen on time or not. [place] gives no guarantee on the reliability, quality, safety, or availability on any rides, or that no mistakes will happen. I know and accept that traveling by vehicle (car, truck, SVU, Zenaida Niece, bus, taxi, etc.) has risks of serious injuries such as disability, being paralyzed, and death. I know and agree the risk of using [employer] is mine alone, and not [employer]. Transport Services are provided "as is" and as are available. The transportation providers are in charge for all inspections and care of the vehicles used to provide these rides. I agree not to take legal action against [place], its agents, employees, officers, directors, representatives, insurers, attorneys, assigns, successors, subsidiaries, and affiliates at any time for any reasons related directly or  indirectly to using [employer]. I also agree not to take legal action against [place] or its affiliates for any injury, death, or damage to property caused by or related to using [employer]. I have read this Waiver and Release of Liability, and I understand the terms used in it and their legal meaning. This Waiver is freely and voluntarily given with the understanding that my right (or any legal minors) to legal action against [place] relating to [employer] is knowingly given up to use these services.   I attest that I read the Ride Waiver and Release of Liability to [name], gave [name] the opportunity to ask questions and answered the questions asked (if any). I affirm that [name] then provided consent for assistance with transportation.  [name], LCSW, CCSW-MCS [phone]    [employer]

## 2022-12-12 NOTE — Progress Notes (Signed)
H&V Care Navigation CSW Progress Note  Clinical Social Worker met with patient to assist with food insecurity.  Patient is participating in a Managed Medicaid Plan:  Yes  Patient seen in clinic today and shared that he is low on food at home. CSW requested to assist with H&V Food Bag for patient to take home. Patient is well known to Tammy Sours, LCSW and will follow up for continued follow up as needed. CSW available as needed. Raquel Sarna, [place], [place]   [place]: Food Insecurity Present (12/12/2022)  Housing: Low Risk  (10/02/2022)  Transportation Needs: Unmet Transportation Needs (10/20/2022)  Utilities: Not At Risk (10/02/2022)  Depression (PHQ2-9): Medium Risk (11/08/2022)  Financial Resource Strain: Low Risk  (10/02/2022)  Tobacco Use: Low Risk  (12/12/2022)

## 2022-12-12 NOTE — Addendum Note (Signed)
Encounter addended by: Levonne Spiller, RN on: 12/12/2022 3:19 PM  Actions taken: LDA properties accepted

## 2022-12-13 ENCOUNTER — Telehealth (HOSPITAL_COMMUNITY): Payer: Self-pay | Admitting: Licensed Clinical Social Worker

## 2022-12-13 NOTE — Telephone Encounter (Signed)
CSW assisted pt in setting up ride to get to appt on 1/3  Pick up from home 11-11:45am Pick up from appt 1:30-2:30pm Ride ID: [id number]  Pt phone not working so let message with [employer] employee near pt home for him to contact me when he got the message.  Sent letter in the mail informing of ride.  Burna Sis, LCSW Clinical Social Worker Advanced [facility] Failure Clinic Desk#: [phone] Cell#: [phone]

## 2022-12-26 ENCOUNTER — Telehealth (HOSPITAL_COMMUNITY): Payer: Self-pay | Admitting: Licensed Clinical Social Worker

## 2022-12-26 NOTE — Telephone Encounter (Signed)
H&V Care Navigation CSW Progress Note  Clinical Social Worker received call from pt who provided CSW with new phone number- CSW updated in system and called Medicaid transport to provide with updated number.  Pt reminded of appt tomorrow and ride details- he is aware and planning to attend.    Pt reports he hasn't gotten food stamps since CSW assisted in updating his address in [facility] system.  CSW will plan to assist pt in calling EBT and DHHS tomorrow to figure out why he is not receiving benefits.  Patient is participating in a Managed Medicaid Plan:  Yes  [place]: Food Insecurity Present (12/12/2022)  Housing: Low Risk  (10/02/2022)  Transportation Needs: Unmet Transportation Needs (12/13/2022)  Utilities: Not At Risk (10/02/2022)  Depression (PHQ2-9): Medium Risk (11/08/2022)  Financial Resource Strain: Low Risk  (10/02/2022)  Tobacco Use: Low Risk  (12/12/2022)    [name], LCSW Clinical Social Worker [facility] Desk#: [phone] Cell#: [phone]

## 2022-12-27 ENCOUNTER — Encounter (HOSPITAL_COMMUNITY): Payer: Self-pay

## 2022-12-27 ENCOUNTER — Ambulatory Visit (HOSPITAL_COMMUNITY)
Admission: RE | Admit: 2022-12-27 | Discharge: 2022-12-27 | Disposition: A | Discharge status: Home / Self Care | Payer: Medicaid Other | Source: Ambulatory Visit | Attending: Physician Assistant | Admitting: Physician Assistant

## 2022-12-27 ENCOUNTER — Telehealth (HOSPITAL_COMMUNITY): Payer: Self-pay | Admitting: Licensed Clinical Social Worker

## 2022-12-27 VITALS — BP 130/80 | HR 92 | Wt 149.0 lb

## 2022-12-27 DIAGNOSIS — I2510 Atherosclerotic heart disease of native coronary artery without angina pectoris: Secondary | ICD-10-CM | POA: Insufficient documentation

## 2022-12-27 DIAGNOSIS — Z79899 Other long term (current) drug therapy: Secondary | ICD-10-CM | POA: Diagnosis not present

## 2022-12-27 DIAGNOSIS — Z91199 Patient's noncompliance with other medical treatment and regimen due to unspecified reason: Secondary | ICD-10-CM | POA: Diagnosis not present

## 2022-12-27 DIAGNOSIS — Z8616 Personal history of COVID-19: Secondary | ICD-10-CM | POA: Diagnosis not present

## 2022-12-27 DIAGNOSIS — Z951 Presence of aortocoronary bypass graft: Secondary | ICD-10-CM | POA: Insufficient documentation

## 2022-12-27 DIAGNOSIS — Z7982 Long term (current) use of aspirin: Secondary | ICD-10-CM | POA: Diagnosis not present

## 2022-12-27 DIAGNOSIS — K0889 Other specified disorders of teeth and supporting structures: Secondary | ICD-10-CM | POA: Insufficient documentation

## 2022-12-27 DIAGNOSIS — Z7984 Long term (current) use of oral hypoglycemic drugs: Secondary | ICD-10-CM | POA: Diagnosis not present

## 2022-12-27 DIAGNOSIS — I110 Hypertensive heart disease with heart failure: Secondary | ICD-10-CM | POA: Diagnosis not present

## 2022-12-27 DIAGNOSIS — I5082 Biventricular heart failure: Secondary | ICD-10-CM | POA: Diagnosis not present

## 2022-12-27 DIAGNOSIS — I5022 Chronic systolic (congestive) heart failure: Secondary | ICD-10-CM | POA: Insufficient documentation

## 2022-12-27 DIAGNOSIS — R109 Unspecified abdominal pain: Secondary | ICD-10-CM | POA: Insufficient documentation

## 2022-12-27 DIAGNOSIS — G40909 Epilepsy, unspecified, not intractable, without status epilepticus: Secondary | ICD-10-CM | POA: Diagnosis not present

## 2022-12-27 DIAGNOSIS — Z8674 Personal history of sudden cardiac arrest: Secondary | ICD-10-CM | POA: Insufficient documentation

## 2022-12-27 DIAGNOSIS — F1010 Alcohol abuse, uncomplicated: Secondary | ICD-10-CM | POA: Diagnosis not present

## 2022-12-27 DIAGNOSIS — I255 Ischemic cardiomyopathy: Secondary | ICD-10-CM | POA: Diagnosis not present

## 2022-12-27 LAB — BASIC METABOLIC PANEL
Anion gap: 9 (ref 5–15)
BUN: 17 mg/dL (ref 6–20)
CO2: 23 mmol/L (ref 22–32)
Calcium: 8.4 mg/dL — ABNORMAL LOW (ref 8.9–10.3)
Chloride: 100 mmol/L (ref 98–111)
Creatinine, Ser: 1.03 mg/dL (ref 0.61–1.24)
GFR, Estimated: >60 mL/min (ref >60)
Glucose, Bld: 62 mg/dL — ABNORMAL LOW (ref 70–99)
Potassium: 3.4 mmol/L — ABNORMAL LOW (ref 3.5–5.1)
Sodium: 132 mmol/L — ABNORMAL LOW (ref 135–145)

## 2022-12-27 LAB — BRAIN NATRIURETIC PEPTIDE: B Natriuretic Peptide: 886.5 pg/mL — ABNORMAL HIGH (ref 0.0–100.0)

## 2022-12-27 MED ORDER — POTASSIUM CHLORIDE 20 MEQ PO PACK: 40.0000 meq | PACK | Freq: Once | ORAL | Status: DC

## 2022-12-27 MED ORDER — POTASSIUM CHLORIDE CRYS ER 20 MEQ PO TBCR
40.0000 meq | EXTENDED_RELEASE_TABLET | Freq: Once | ORAL | Status: AC
  Administered 2022-12-27: 40 meq via ORAL

## 2022-12-27 MED ORDER — METOLAZONE 2.5 MG PO TABS
2.5000 mg | ORAL_TABLET | Freq: Once | ORAL | Status: AC
  Administered 2022-12-27: 2.5 mg via ORAL

## 2022-12-27 MED ORDER — LOSARTAN POTASSIUM 25 MG PO TABS: 25.0000 mg | ORAL_TABLET | Freq: Every day | ORAL | 3 refills | Status: DC

## 2022-12-27 NOTE — Patient Instructions (Signed)
Medication Changes:  We gave you Metolazone and Potassium today in clinic  Increase Losartan to 25 mg Daily  Lab Work:  Labs done today, we will call you for abnormal results  Testing/Procedures:  none  Referrals:  none  Special Instructions // Education:  Do the following things EVERYDAY: Weigh yourself in the morning before breakfast. Write it down and keep it in a log. Take your medicines as prescribed Eat low salt foods--Limit salt (sodium) to 2000 mg per day.  Stay as active as you can everyday Limit all fluids for the day to less than 2 liters   Follow-Up in: 3-4 weeks  At the [facility], you and your health needs are our priority. We have a designated team specialized in the treatment of Heart Failure. This Care Team includes your primary Heart Failure Specialized Cardiologist (physician), Advanced Practice Providers (APPs- Physician Assistants and Nurse Practitioners), and Pharmacist who all work together to provide you with the care you need, when you need it.   You may see any of the following providers on your designated Care Team at your next follow up:  Dr. Glori Bickers Dr. Loralie Champagne Dr. Roxana Hires, NP Lyda Jester, [place] [facility] [place], [place] Forestine Na, NP Audry Riles, PharmD   Please be sure to bring in all your medications bottles to every appointment.   Need to Contact us:  If you have any questions or concerns before your next appointment please send [place] a message through [place] or call our office at [phone].    TO LEAVE A MESSAGE FOR THE NURSE SELECT OPTION 2, PLEASE LEAVE A MESSAGE INCLUDING: YOUR NAME DATE OF BIRTH CALL BACK NUMBER REASON FOR CALL**this is important as we prioritize the call backs  YOU WILL RECEIVE A CALL BACK THE SAME DAY AS LONG AS YOU CALL BEFORE 4:00 PM

## 2022-12-27 NOTE — Telephone Encounter (Signed)
H&V Care Navigation CSW Progress Note  Clinical Social Worker informed during visit that pt having concerns with his teeth/mouth hurting.  Pt has not been to dentist in many years- thinks since the 90s.  CSW found [facility] and they confirm they can accept pt insurance and have appt available Tuesday at 10:45am  CSW called [place] transport and arranged ride to take to appt-  Pick up from home 9:55am-10:10am Pick up from appt 12:30am-1pm  Pt informed of above details.  Patient is participating in a Managed Medicaid Plan:  Yes  [place]: Food Insecurity Present (12/12/2022)  Housing: Low Risk  (10/02/2022)  Transportation Needs: Unmet Transportation Needs (12/27/2022)  Utilities: Not At Risk (10/02/2022)  Depression (PHQ2-9): Medium Risk (11/08/2022)  Financial Resource Strain: High Risk (12/27/2022)  Tobacco Use: Low Risk  (12/27/2022)   [name], LCSW Clinical Social Worker Advanced [facility] Desk#: [phone] Cell#: [phone]

## 2022-12-27 NOTE — Progress Notes (Signed)
Metolazone 2.5 mg PO and Klor-con 20 meq (2 tabs) PO administered to pt per Marlyce Huge, PA  Kevan Rosebush, RN, BSN, [facility] Specialty Coordinator Advanced Heart Failure Clinic

## 2022-12-27 NOTE — Progress Notes (Addendum)
Advanced Heart Failure Clinic Note  PCP: Geryl Rankins, NP HF Cardiologist: Dr Haroldine Laws   Reason for Visit: F/u for HFrEF   HPI: [name] is a 62 y.o. male with h/o ETOH abuse, VF Arrest 2018, CAD s/p CABG 12/07/17, and ICM with EF 25-30%.   Admitted 12/18 with VF arrest while chopping wood. Received CPR in the field. R/LHC with severe 3vD as below and cardiogenic shock requiring IABP and milrinone. Required milrinone with continued shock. Pt improved and IABP removed 12/10, but pt had recurrent VF arrest so IABP replaced. Stabilized and underwent CABG 12/07/17. Tolerated gradual wean of meds and extubation with no further events.  Echo 04/2019  LVEF at 25%.    9/20 implantation of a [employer] single chamber ICD.   He had routine clinic f/u on 10/23/19 and while in exam room had a witnessed seizure  with loss of pulse. CPR started with quick ROSC. He did not require shock. Taken to the ED and had another seizure. Neurology consulted. Started on Keppra. ICD interrogated. No arrhythmias noted. Admitted to [facility] on 12/09/19 with recurrent seizures in setting of noncompliance with his Keppra.    Admitted 8/21 with COVID PNA.   Follow up 5/22 he was having facial and arm tingling, no other neuro symptoms. Ethanol level 145. Carvedilol increased and carotid u/s ordered showing bilateral ICA 1-39%.  Follow up 9/22, he was off all meds. Resumed meds and re-enrolled with Paramedicine. Follow up 10/22, he remained off all meds, NYHA II. Meds restarted, but lost to follow up.   Follow up 09/11/22, off all meds except Keppra, felt bad and volume overloaded.  Attempted outpatient diuresis and titration of GDMT but admitted in 10/23 d/t a/c CHF.   Seen for f/u 12/12/22. Had been in [facility] for belly pain and swelling. Says they gave him an Advil and sent him home. No records to review. Continued to endorse abdominal pain at f/u. Lipase, Tbili,  indirect bilirubin and direct bilirubin elevated. He was referred to GI for workup. Also appeared volume overloaded and IV lasix arranged in infusion clinic.   He is here today for close follow-up. Main concern today is tooth pain. He believes he chipped a couple of teeth chewing on ice. His mouth aches. Denies dyspnea, orthopnea, PND or lower extremity edema. Reports occasional vague abdominal pain but nothing bothersome today. He is unable to remember any recent weights. Reports taking all medications. Did not bring med list or meds to today's appointment, states he takes them from bubble packs every day. He states he only drinks 1-2 beers a day.  Currently renting a room. His girlfriend is staying with him but does not like his roommates. Receives rides to clinic through [place].  Has not received food stamps.   ROS: All systems negative except as listed in HPI, PMH and Problem List.  SH:  Social History   Socioeconomic History   Marital status: Divorced    Spouse name: Not on file   Number of children: Not on file   Years of education: Not on file   Highest education level: Not on file  Occupational History   Not on file  Tobacco Use   Smoking status: Never   Smokeless tobacco: Never  Vaping Use   Vaping Use: Never used  Substance and Sexual Activity   Alcohol use: Yes  Alcohol/week: 14.0 standard drinks of alcohol    Types: 14 Cans of beer per week    Comment: patient endorses at least 1-2 beers daily (04/2020)   Drug use: No   Sexual activity: Not Currently  Other Topics Concern   Not on file  Social History Narrative   Lives at home with his brother who is crippled. He takes care of his brother.    Right handed    Social Determinants of Health   Financial Resource Strain: Low Risk  (10/02/2022)   Overall Financial Resource Strain (CARDIA)    Difficulty of Paying Living Expenses: Not very hard  Food Insecurity: Food Insecurity Present (12/12/2022)   Hunger Vital  Sign    Worried About Running Out of Food in the Last Year: Often true    Ran Out of Food in the Last Year: Often true  Transportation Needs: Unmet Transportation Needs (12/13/2022)   PRAPARE - Hydrologist (Medical): Yes    Lack of Transportation (Non-Medical): Yes  Physical Activity: Not on file  Stress: Not on file  Social Connections: Not on file  Intimate Partner Violence: Not on file   FH:  Family History  Problem Relation Age of Onset   Heart attack Other    Diabetes Neg Hx    Past Medical History:  Diagnosis Date   AICD (automatic cardioverter/defibrillator) present    Alcohol abuse 05/18/2020   Arthritis    "hands; maybe my toes" (12/20/2017)   CAD (coronary artery disease)    Cardiac arrest ([place]) 11/29/2017   hx VF arrest/notes 12/20/2017   CHF (congestive heart failure) (HCC)    Chronic systolic heart failure ([place]) 08/25/2019   Coronary artery disease    Coronary artery disease involving native coronary artery of native heart without angina pectoris 12/07/2017   Essential hypertension 05/18/2020   GERD (gastroesophageal reflux disease)    H/O ETOH abuse    /notes 12/20/2017   High cholesterol    Hypertension    Ischemic cardiomyopathy    a. 08/2019 s/p BSX D232 single lead AICD.   Mixed hyperlipidemia 05/18/2020   Seizure ([place]) 10/21/2019   Seizures ([place]) ~ 2016; ~ 2017   "I've had a couple; I was at work" (12/20/2017)   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, 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 ([place]) 12/30/2019   Syncope and collapse    "last few days" (12/20/2017)   Current Outpatient Medications  Medication Sig Dispense Refill   aspirin EC 81 MG tablet Take 1 tablet (81 mg total) by mouth daily. Swallow whole. 30 tablet 11   atorvastatin (LIPITOR) 80 MG tablet Take 1 tablet (80 mg total) by mouth in the morning. 30 tablet 11   empagliflozin (JARDIANCE) 10 MG TABS tablet Take 1 tablet (10 mg total) by  mouth daily. 30 tablet 11   furosemide (LASIX) 40 MG tablet Take 1 tablet (40 mg total) by mouth daily. 30 tablet 3   levETIRAcetam (KEPPRA) 500 MG tablet TAKE 1 TABLET BY MOUTH 2 TIMES DAILY 180 tablet 3   metoprolol succinate (TOPROL XL) 25 MG 24 hr tablet Take 0.5 tablets (12.5 mg total) by mouth daily. 15 tablet 7   potassium chloride SA (KLOR-CON M) 20 MEQ tablet Take 2 tablets (40 mEq total) by mouth daily. 60 tablet 11   losartan (COZAAR) 25 MG tablet Take 1 tablet (25 mg total) by mouth daily. 90 tablet 3   No current facility-administered medications for this encounter.  BP 130/80   Pulse 92   Wt 67.6 kg (149 lb)   SpO2 97%   BMI 21.38 kg/m   Wt Readings from Last 3 Encounters:  12/27/22 67.6 kg (149 lb)  12/12/22 66.7 kg (147 lb)  11/08/22 63.1 kg (139 lb 3.2 oz)   PHYSICAL EXAM: General:  Thin, chronically ill appearing. HEENT: normal Neck: supple. JVP to jaw. Carotids 2+ bilat; no bruits.  Cor: PMI nondisplaced. Regular rate & rhythm. No rubs, gallops or murmurs. Lungs: clear Abdomen: soft, nontender, + mildly distended.  Extremities: no cyanosis, clubbing, rash, trace edema Neuro: alert & orientedx3, cranial nerves grossly intact. moves all 4 extremities w/o difficulty. Affect pleasant   Device interrogation: HL index 16, thoracic impedance down, activity level 2 hrs/day, average HR 99 bpm, no shocks  ECG (personally reviewed): NSR 93 bpm, LBBB  ASSESSMENT & PLAN: 1. Acute on chronic Biventricular HF, ICM  - Echo 11/30/17 25-30% RV normal.   - Echo 03/2018: EF 40-45%  RV normal.  - Echo 5/20 EF ~ 25%. Has ICD.  - Echo 12/2019 EF 30-35% RV normal. - Mixed Ischemic + probable ETOH mediated component  - Echo (10/23): biventricular failure getting worse from LVEF 30-35% Mod RV--> LVEF < 20% severely reduced RV. - Has [employer] ICD.  - NYHA IIIb. Volume overloaded on exam today. Continue lasix 40 mg daily. He was given 2.5 mg metolazone in the clinic + 40 mEq  potassium. Do not believe he can manage taking extra diuretic PRN on his own. - GDMT has been limited given compliance and hypotension. Will try to keep regimen simple. Do not recommend BID meds. - Continue Toprol XL 12.5 mg daily. - Continue Jardiance 10 mg daily.  - Increase Losartan to 25 mg daily (will be included in next bubble pack) - Now off digoxin d/t elevated level. - No spiro with h/o gynecomastia. Could consider Inspra but I am concerned about his follow up.  - Not a candidate for advanced therapies/transplant with ongoing ETOH/substance abuse and compliance (this includes home inotrope).  - BMET/BNP today, repeat BMET at f/u in 2 weeks   2. CAD - S/P CABG x 3 2018  - No chest pain - Continue aspirin + atorvastatin.   3. Seizure Disorder - On Keppra.    4. ETOH Abuse - Reports drinking only 1-2 beers/day. Suspect he consumes more than this. - Discussed cessation.   5. Abdominal pain -Lipase, Tbili, direct and indirect bili elevated on recent labs -Referred to GI for further workup at visit last month -Notes less abdominal discomfort today  6. Tooth/mouth pain -Will see if social work can assist with referral to local dentist accepting medicaid  7. SDOH - Very difficult to manage with ongoing substance abuse and noncompliance.  - Have not been able to enroll in Paramedicine, the team has been unable to maintain contact with him.  - He has been followed by HF CSW [place]. Meds now bubble packed which seems to be helping with adherence. Also has transportation assistance. Working on getting food stamps   F/u: 2-3 weeks with APP to assess volume  Marlyce Huge, PA-C 12/27/22
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
## 2022-12-28 ENCOUNTER — Telehealth (HOSPITAL_COMMUNITY): Payer: Self-pay | Admitting: Licensed Clinical Social Worker

## 2022-12-28 ENCOUNTER — Telehealth (HOSPITAL_COMMUNITY): Payer: Self-pay | Admitting: *Deleted

## 2022-12-28 ENCOUNTER — Other Ambulatory Visit (HOSPITAL_COMMUNITY): Payer: Self-pay | Admitting: Surgery

## 2022-12-28 DIAGNOSIS — E876 Hypokalemia: Secondary | ICD-10-CM

## 2022-12-28 DIAGNOSIS — I5023 Acute on chronic systolic (congestive) heart failure: Secondary | ICD-10-CM

## 2022-12-28 NOTE — Telephone Encounter (Signed)
Called patient per Jeneen Rinks, PA re: lab results from 12/27/21:  "BNP trending down. Kidney function stable and K low. He was given an extra 40 mEq K with metolazone today. Med compliance at home not certain. Would ideally like him to get BMET in 1 week to recheck K if at all feasible. Could be done at a center closer to home - repeat lab (BMP) in one week to check potassium"  Unable to verify with patient local lab and his ability to get repeat labs. Messaged Tammy Sours, LCSW to assist with same. Will need to place appropriate order for BMP if patient agrees to same.

## 2022-12-28 NOTE — Progress Notes (Signed)
Patient to return for labwork per Marlyce Huge PA.  Scheduled and order added for Wednesday Jan 10th at [facility].  LCSW working with patient to arrange transportation for this appt.

## 2022-12-28 NOTE — Telephone Encounter (Signed)
H&V Care Navigation CSW Progress Note  Clinical Social Worker contacted patient by phone to assist with transportation.  Patient is participating in a Managed Medicaid Plan:  Yes   CSW requested to assist with scheduling a lab follow up appointment and transportation. CSW spoke with patient who is agreeable to come to the lab next Wednesday 01-03-23 @ 8:45 am. CSW contacted Medicaid transportation and made arrangements for appointment. Patient pick up time: 7:45 am Patient return pick up time: 9:15 am Reference #: [id number]  CSW available as needed.  Raquel Sarna, [place], [place]  SDOH Screenings   Food Insecurity: Food Insecurity Present (12/12/2022)  Housing: Low Risk  (10/02/2022)  Transportation Needs: Unmet Transportation Needs (12/27/2022)  Utilities: Not At Risk (10/02/2022)  Depression (PHQ2-9): Medium Risk (11/08/2022)  Financial Resource Strain: High Risk (12/27/2022)  Tobacco Use: Low Risk  (12/27/2022)

## 2023-01-02 ENCOUNTER — Telehealth (HOSPITAL_COMMUNITY): Payer: Self-pay | Admitting: Licensed Clinical Social Worker

## 2023-01-02 MED ORDER — LOSARTAN POTASSIUM 25 MG PO TABS: 25.0000 mg | ORAL_TABLET | Freq: Every day | ORAL | 3 refills | Status: DC

## 2023-01-02 NOTE — Addendum Note (Signed)
Addended by: Zaion Hreha, Sharlot Gowda on: 01/02/2023 12:10 PM   Modules accepted: Orders

## 2023-01-02 NOTE — Telephone Encounter (Signed)
H&V Care Navigation CSW Progress Note  Clinical Social Worker called [facility] to request new pill packs be sent out- they are working on filling and will send out today.  Patient is participating in a Managed Medicaid Plan:  Yes  [place]: Food Insecurity Present (12/12/2022)  Housing: Low Risk  (10/02/2022)  Transportation Needs: Unmet Transportation Needs (12/28/2022)  Utilities: Not At Risk (10/02/2022)  Depression (PHQ2-9): Medium Risk (11/08/2022)  Financial Resource Strain: High Risk (12/27/2022)  Tobacco Use: Low Risk  (12/27/2022)   [name], LCSW Clinical Social Worker Advanced Heart Failure Clinic Desk#: [phone] Cell#: [phone]

## 2023-01-03 ENCOUNTER — Ambulatory Visit (HOSPITAL_COMMUNITY)
Admission: RE | Admit: 2023-01-03 | Discharge: 2023-01-03 | Disposition: A | Discharge status: Home / Self Care | Payer: Medicaid Other | Source: Ambulatory Visit | Attending: Cardiology | Admitting: Cardiology

## 2023-01-03 ENCOUNTER — Telehealth (HOSPITAL_COMMUNITY): Payer: Self-pay | Admitting: Licensed Clinical Social Worker

## 2023-01-03 DIAGNOSIS — E876 Hypokalemia: Secondary | ICD-10-CM | POA: Diagnosis present

## 2023-01-03 LAB — BASIC METABOLIC PANEL
Anion gap: 10 (ref 5–15)
BUN: 12 mg/dL (ref 6–20)
CO2: 30 mmol/L (ref 22–32)
Calcium: 9.4 mg/dL (ref 8.9–10.3)
Chloride: 96 mmol/L — ABNORMAL LOW (ref 98–111)
Creatinine, Ser: 0.93 mg/dL (ref 0.61–1.24)
GFR, Estimated: >60 mL/min (ref >60)
Glucose, Bld: 77 mg/dL (ref 70–99)
Potassium: 3.9 mmol/L (ref 3.5–5.1)
Sodium: 136 mmol/L (ref 135–145)

## 2023-01-03 NOTE — Telephone Encounter (Signed)
H&V Care Navigation CSW Progress Note  Clinical Social Worker working with pt to find dentist- was set up with appt for Tuesday but they ended up being unable to accept his insurance.  CSW able to locate in network dental clinic today and arranged an appt with pt:  [facility] Thursday, January 18th at 1pm  Medicaid Ride: Pick up from home: 12-12:30pm Pick up from appt: 2:30pm Ride ID: [id number]  Patient is participating in a Managed Medicaid Plan:  Yes  [place]: Food Insecurity Present (12/12/2022)  Housing: Low Risk  (10/02/2022)  Transportation Needs: Unmet Transportation Needs (01/03/2023)  Utilities: Not At Risk (10/02/2022)  Depression (PHQ2-9): Medium Risk (11/08/2022)  Financial Resource Strain: High Risk (12/27/2022)  Tobacco Use: Low Risk  (12/27/2022)    [name], [facility] Desk#: [phone] Cell#: [phone]

## 2023-01-15 ENCOUNTER — Telehealth (HOSPITAL_COMMUNITY): Payer: Self-pay | Admitting: Licensed Clinical Social Worker

## 2023-01-15 DIAGNOSIS — R609 Edema, unspecified: Secondary | ICD-10-CM | POA: Diagnosis not present

## 2023-01-15 DIAGNOSIS — R069 Unspecified abnormalities of breathing: Secondary | ICD-10-CM | POA: Diagnosis not present

## 2023-01-15 NOTE — Telephone Encounter (Signed)
H&V Care Navigation CSW Progress Note  Clinical Social Worker worked with patient to complete [employer] with patient. Assisted in creating [employer] account and applied through [employer].  Had to submit incomplete application due to system errors but confirmed application was submitted alongside identifying documents.  Patient is participating in a Managed Medicaid Plan:  Yes  [place]: Food Insecurity Present (01/15/2023)  Housing: Low Risk  (10/02/2022)  Transportation Needs: Unmet Transportation Needs (01/03/2023)  Utilities: Not At Risk (10/02/2022)  Depression (PHQ2-9): Medium Risk (11/08/2022)  Financial Resource Strain: High Risk (12/27/2022)  Tobacco Use: Low Risk  (12/27/2022)    [name], LCSW Clinical Social Worker Advanced Heart Failure Clinic Desk#: [phone] Cell#: [phone]

## 2023-01-16 DIAGNOSIS — J9811 Atelectasis: Secondary | ICD-10-CM | POA: Diagnosis not present

## 2023-01-16 DIAGNOSIS — Z951 Presence of aortocoronary bypass graft: Secondary | ICD-10-CM | POA: Diagnosis not present

## 2023-01-16 DIAGNOSIS — I517 Cardiomegaly: Secondary | ICD-10-CM | POA: Diagnosis not present

## 2023-01-16 DIAGNOSIS — M7989 Other specified soft tissue disorders: Secondary | ICD-10-CM | POA: Diagnosis not present

## 2023-01-19 NOTE — Progress Notes (Signed)
Advanced Heart Failure Clinic Note  PCP: Geryl Rankins, NP HF Cardiologist: Dr Haroldine Laws   Reason for Visit: F/u for HFrEF   HPI: [name] is a 62 y.o. male with h/o ETOH abuse, VF Arrest 2018, CAD s/p CABG 12/07/17, and ICM with EF 25-30%.   Admitted 12/18 with VF arrest while chopping wood. Received CPR in the field. R/LHC with severe 3vD as below and cardiogenic shock requiring IABP and milrinone. Required milrinone with continued shock. Pt improved and IABP removed 12/10, but pt had recurrent VF arrest so IABP replaced. Stabilized and underwent CABG 12/07/17. Tolerated gradual wean of meds and extubation with no further events.  Echo 04/2019  LVEF at 25%.    9/20 implantation of a [employer] single chamber ICD.   He had routine clinic f/u on 10/23/19 and while in exam room had a witnessed seizure  with loss of pulse. CPR started with quick ROSC. He did not require shock. Taken to the ED and had another seizure. Neurology consulted. Started on Keppra. ICD interrogated. No arrhythmias noted. Admitted to [facility] on 12/09/19 with recurrent seizures in setting of noncompliance with his Keppra.    Admitted 8/21 with COVID PNA.   Follow up 5/22 he was having facial and arm tingling, no other neuro symptoms. Ethanol level 145. Carvedilol increased and carotid u/s ordered showing bilateral ICA 1-39%.  Follow up 9/22, he was off all meds. Resumed meds and re-enrolled with Paramedicine. Follow up 10/22, he remained off all meds, NYHA II. Meds restarted, but lost to follow up.   Follow up 09/11/22, off all meds except Keppra, felt bad and volume overloaded.  Attempted outpatient diuresis and titration of GDMT but admitted in 10/23 d/t a/c CHF.   Seen for f/u 12/12/22. Had been in [facility] for belly pain and swelling. Says they gave him an Advil and sent him home. No records to review. Continued to endorse abdominal pain at f/u. Lipase, Tbili,  indirect bilirubin and direct bilirubin elevated. He was referred to GI for workup. Also appeared volume overloaded and IV lasix arranged in infusion clinic.   He is here today for close follow-up. Main concern today is tooth pain. He believes he chipped a couple of teeth chewing on ice. His mouth aches. Denies dyspnea, orthopnea, PND or lower extremity edema. Reports occasional vague abdominal pain but nothing bothersome today. He is unable to remember any recent weights. Reports taking all medications. Did not bring med list or meds to today's appointment, states he takes them from bubble packs every day. He states he only drinks 1-2 beers a day.  Currently renting a room. His girlfriend is staying with him but does not like his roommates. Receives rides to clinic through [place].  Has not received food stamps.   ROS: All systems negative except as listed in HPI, PMH and Problem List.  SH:  Social History   Socioeconomic History   Marital status: Divorced    Spouse name: Not on file   Number of children: Not on file   Years of education: Not on file   Highest education level: Not on file  Occupational History   Not on file  Tobacco Use   Smoking status: Never   Smokeless tobacco: Never  Vaping Use   Vaping Use: Never used  Substance and Sexual Activity   Alcohol use: Yes  Alcohol/week: 14.0 standard drinks of alcohol    Types: 14 Cans of beer per week    Comment: patient endorses at least 1-2 beers daily (04/2020)   Drug use: No   Sexual activity: Not Currently  Other Topics Concern   Not on file  Social History Narrative   Lives at home with his brother who is crippled. He takes care of his brother.    Right handed    Social Determinants of Health   Financial Resource Strain: High Risk (12/27/2022)   Overall Financial Resource Strain (CARDIA)    Difficulty of Paying Living Expenses: Hard  Food Insecurity: Food Insecurity Present (01/15/2023)   Hunger Vital Sign     Worried About Running Out of Food in the Last Year: Often true    Ran Out of Food in the Last Year: Often true  Transportation Needs: Unmet Transportation Needs (01/03/2023)   PRAPARE - Hydrologist (Medical): Yes    Lack of Transportation (Non-Medical): Yes  Physical Activity: Not on file  Stress: Not on file  Social Connections: Not on file  Intimate Partner Violence: Not on file   FH:  Family History  Problem Relation Age of Onset   Heart attack Other    Diabetes Neg Hx    Past Medical History:  Diagnosis Date   AICD (automatic cardioverter/defibrillator) present    Alcohol abuse 05/18/2020   Arthritis    "hands; maybe my toes" (12/20/2017)   CAD (coronary artery disease)    Cardiac arrest ([place]) 11/29/2017   hx VF arrest/notes 12/20/2017   CHF (congestive heart failure) ([place])    Chronic systolic heart failure ([place]) 08/25/2019   Coronary artery disease    Coronary artery disease involving native coronary artery of native heart without angina pectoris 12/07/2017   Essential hypertension 05/18/2020   GERD (gastroesophageal reflux disease)    H/O ETOH abuse    /notes 12/20/2017   High cholesterol    Hypertension    Ischemic cardiomyopathy    a. 08/2019 s/p BSX D232 single lead AICD.   Mixed hyperlipidemia 05/18/2020   Seizure ([place]) 10/21/2019   Seizures ([place]) ~ 2016; ~ 2017   "I've had a couple; I was at work" (12/20/2017)   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, 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 ([place]) 12/30/2019   Syncope and collapse    "last few days" (12/20/2017)   Current Outpatient Medications  Medication Sig Dispense Refill   aspirin EC 81 MG tablet Take 1 tablet (81 mg total) by mouth daily. Swallow whole. 30 tablet 11   atorvastatin (LIPITOR) 80 MG tablet Take 1 tablet (80 mg total) by mouth in the morning. 30 tablet 11   empagliflozin (JARDIANCE) 10 MG TABS tablet Take 1 tablet (10 mg total) by mouth daily.  30 tablet 11   furosemide (LASIX) 40 MG tablet Take 1 tablet (40 mg total) by mouth daily. 30 tablet 3   levETIRAcetam (KEPPRA) 500 MG tablet TAKE 1 TABLET BY MOUTH 2 TIMES DAILY 180 tablet 3   losartan (COZAAR) 25 MG tablet Take 1 tablet (25 mg total) by mouth daily. 90 tablet 3   metoprolol succinate (TOPROL XL) 25 MG 24 hr tablet Take 0.5 tablets (12.5 mg total) by mouth daily. 15 tablet 7   potassium chloride SA (KLOR-CON M) 20 MEQ tablet Take 2 tablets (40 mEq total) by mouth daily. 60 tablet 11   No current facility-administered medications for this visit.   There  were no vitals taken for this visit.  Wt Readings from Last 3 Encounters:  12/27/22 67.6 kg (149 lb)  12/12/22 66.7 kg (147 lb)  11/08/22 63.1 kg (139 lb 3.2 oz)   PHYSICAL EXAM: General:  Thin, chronically ill appearing. HEENT: normal Neck: supple. JVP to jaw. Carotids 2+ bilat; no bruits.  Cor: PMI nondisplaced. Regular rate & rhythm. No rubs, gallops or murmurs. Lungs: clear Abdomen: soft, nontender, + mildly distended.  Extremities: no cyanosis, clubbing, rash, trace edema Neuro: alert & orientedx3, cranial nerves grossly intact. moves all 4 extremities w/o difficulty. Affect pleasant   Device interrogation: HL index 16, thoracic impedance down, activity level 2 hrs/day, average HR 99 bpm, no shocks  ECG (personally reviewed): NSR 93 bpm, LBBB  ASSESSMENT & PLAN: 1. Acute on chronic Biventricular HF, ICM  - Echo 11/30/17 25-30% RV normal.   - Echo 03/2018: EF 40-45%  RV normal.  - Echo 5/20 EF ~ 25%. Has ICD.  - Echo 12/2019 EF 30-35% RV normal. - Mixed Ischemic + probable ETOH mediated component  - Echo (10/23): biventricular failure getting worse from LVEF 30-35% Mod RV--> LVEF < 20% severely reduced RV. - Has [employer] ICD.  - NYHA IIIb. Volume overloaded on exam today. Continue lasix 40 mg daily. He was given 2.5 mg metolazone in the clinic + 40 mEq potassium. Do not believe he can manage taking  extra diuretic PRN on his own. - GDMT has been limited given compliance and hypotension. Will try to keep regimen simple. Do not recommend BID meds. - Continue Toprol XL 12.5 mg daily. - Continue Jardiance 10 mg daily.  - Increase Losartan to 25 mg daily (will be included in next bubble pack) - Now off digoxin d/t elevated level. - No spiro with h/o gynecomastia. Could consider Inspra but I am concerned about his follow up.  - Not a candidate for advanced therapies/transplant with ongoing ETOH/substance abuse and compliance (this includes home inotrope).  - BMET/BNP today, repeat BMET at f/u in 2 weeks   2. CAD - S/P CABG x 3 2018  - No chest pain - Continue aspirin + atorvastatin.   3. Seizure Disorder - On Keppra.    4. ETOH Abuse - Reports drinking only 1-2 beers/day. Suspect he consumes more than this. - Discussed cessation.   5. Abdominal pain -Lipase, Tbili, direct and indirect bili elevated on recent labs -Referred to GI for further workup at visit last month -Notes less abdominal discomfort today  6. Tooth/mouth pain -Will see if social work can assist with referral to local dentist accepting medicaid  7. SDOH - Very difficult to manage with ongoing substance abuse and noncompliance.  - Have not been able to enroll in Paramedicine, the team has been unable to maintain contact with him.  - He has been followed by HF CSW [place]. Meds now bubble packed which seems to be helping with adherence. Also has transportation assistance. Working on getting food stamps   F/u: 2-3 weeks with APP to assess volume  Anna Genre, PA-C 01/19/23

## 2023-01-21 DIAGNOSIS — R531 Weakness: Secondary | ICD-10-CM | POA: Diagnosis not present

## 2023-01-21 DIAGNOSIS — I10 Essential (primary) hypertension: Secondary | ICD-10-CM | POA: Diagnosis not present

## 2023-01-21 DIAGNOSIS — R059 Cough, unspecified: Secondary | ICD-10-CM | POA: Diagnosis not present

## 2023-01-22 ENCOUNTER — Encounter (HOSPITAL_COMMUNITY): Payer: Self-pay

## 2023-01-22 ENCOUNTER — Other Ambulatory Visit (HOSPITAL_COMMUNITY): Payer: Self-pay | Admitting: Family Medicine

## 2023-01-22 ENCOUNTER — Telehealth (HOSPITAL_COMMUNITY): Payer: Self-pay | Admitting: Licensed Clinical Social Worker

## 2023-01-22 ENCOUNTER — Ambulatory Visit (HOSPITAL_COMMUNITY)
Admission: RE | Admit: 2023-01-22 | Discharge: 2023-01-22 | Disposition: A | Discharge status: Home / Self Care | Payer: Medicaid Other | Source: Ambulatory Visit | Attending: Family Medicine | Admitting: Family Medicine

## 2023-01-22 ENCOUNTER — Other Ambulatory Visit (HOSPITAL_COMMUNITY): Payer: Self-pay

## 2023-01-22 VITALS — BP 120/70 | Ht 69.0 in | Wt 154.6 lb

## 2023-01-22 DIAGNOSIS — Z139 Encounter for screening, unspecified: Secondary | ICD-10-CM

## 2023-01-22 DIAGNOSIS — I5082 Biventricular heart failure: Secondary | ICD-10-CM | POA: Insufficient documentation

## 2023-01-22 DIAGNOSIS — I5023 Acute on chronic systolic (congestive) heart failure: Secondary | ICD-10-CM | POA: Diagnosis not present

## 2023-01-22 DIAGNOSIS — Z951 Presence of aortocoronary bypass graft: Secondary | ICD-10-CM | POA: Diagnosis present

## 2023-01-22 DIAGNOSIS — Z79899 Other long term (current) drug therapy: Secondary | ICD-10-CM | POA: Insufficient documentation

## 2023-01-22 DIAGNOSIS — R109 Unspecified abdominal pain: Secondary | ICD-10-CM | POA: Insufficient documentation

## 2023-01-22 DIAGNOSIS — Z7984 Long term (current) use of oral hypoglycemic drugs: Secondary | ICD-10-CM | POA: Diagnosis not present

## 2023-01-22 DIAGNOSIS — Z91199 Patient's noncompliance with other medical treatment and regimen due to unspecified reason: Secondary | ICD-10-CM | POA: Insufficient documentation

## 2023-01-22 DIAGNOSIS — I110 Hypertensive heart disease with heart failure: Secondary | ICD-10-CM | POA: Insufficient documentation

## 2023-01-22 DIAGNOSIS — Z8674 Personal history of sudden cardiac arrest: Secondary | ICD-10-CM | POA: Diagnosis not present

## 2023-01-22 DIAGNOSIS — I2510 Atherosclerotic heart disease of native coronary artery without angina pectoris: Secondary | ICD-10-CM | POA: Diagnosis not present

## 2023-01-22 DIAGNOSIS — Z7982 Long term (current) use of aspirin: Secondary | ICD-10-CM | POA: Insufficient documentation

## 2023-01-22 DIAGNOSIS — G40909 Epilepsy, unspecified, not intractable, without status epilepticus: Secondary | ICD-10-CM | POA: Insufficient documentation

## 2023-01-22 DIAGNOSIS — R569 Unspecified convulsions: Secondary | ICD-10-CM

## 2023-01-22 DIAGNOSIS — F1010 Alcohol abuse, uncomplicated: Secondary | ICD-10-CM | POA: Diagnosis not present

## 2023-01-22 DIAGNOSIS — I959 Hypotension, unspecified: Secondary | ICD-10-CM | POA: Diagnosis not present

## 2023-01-22 DIAGNOSIS — Z91148 Patient's other noncompliance with medication regimen for other reason: Secondary | ICD-10-CM

## 2023-01-22 DIAGNOSIS — I5022 Chronic systolic (congestive) heart failure: Secondary | ICD-10-CM

## 2023-01-22 LAB — BASIC METABOLIC PANEL
Anion gap: 9 (ref 5–15)
BUN: 15 mg/dL (ref 6–20)
CO2: 26 mmol/L (ref 22–32)
Calcium: 8.7 mg/dL — ABNORMAL LOW (ref 8.9–10.3)
Chloride: 97 mmol/L — ABNORMAL LOW (ref 98–111)
Creatinine, Ser: 1.32 mg/dL — ABNORMAL HIGH (ref 0.61–1.24)
GFR, Estimated: >60 mL/min (ref >60)
Glucose, Bld: 115 mg/dL — ABNORMAL HIGH (ref 70–99)
Potassium: 3.2 mmol/L — ABNORMAL LOW (ref 3.5–5.1)
Sodium: 132 mmol/L — ABNORMAL LOW (ref 135–145)

## 2023-01-22 LAB — BRAIN NATRIURETIC PEPTIDE: B Natriuretic Peptide: 983 pg/mL — ABNORMAL HIGH (ref 0.0–100.0)

## 2023-01-22 MED ORDER — POTASSIUM CHLORIDE CRYS ER 20 MEQ PO TBCR
40.0000 meq | EXTENDED_RELEASE_TABLET | Freq: Every day | ORAL | 0 refills | Status: DC
  Filled 2023-01-22: qty 60, 30d supply, fill #0

## 2023-01-22 MED ORDER — POTASSIUM CHLORIDE CRYS ER 20 MEQ PO TBCR
80.0000 meq | EXTENDED_RELEASE_TABLET | Freq: Every day | ORAL | 0 refills | Status: DC
  Filled 2023-01-22: qty 60, 15d supply, fill #0

## 2023-01-22 MED ORDER — FUROSEMIDE 40 MG PO TABS
80.0000 mg | ORAL_TABLET | Freq: Every day | ORAL | 0 refills | Status: DC
  Filled 2023-01-22: qty 30, 15d supply, fill #0

## 2023-01-22 MED ORDER — METOLAZONE 2.5 MG PO TABS: 2.5000 mg | ORAL_TABLET | Freq: Once | ORAL | Status: DC

## 2023-01-22 MED ORDER — FUROSEMIDE 40 MG PO TABS
40.0000 mg | ORAL_TABLET | Freq: Every day | ORAL | 0 refills | Status: DC
  Filled 2023-01-22: qty 30, 30d supply, fill #0

## 2023-01-22 MED ORDER — POTASSIUM CHLORIDE CRYS ER 20 MEQ PO TBCR: 40.0000 meq | EXTENDED_RELEASE_TABLET | Freq: Once | ORAL | Status: DC

## 2023-01-22 NOTE — Progress Notes (Signed)
Heart and Vascular Care Navigation  01/22/2023  [name] Jun 30, 1961 [id number]  Reason for Referral:  trouble getting medications due to patient SDOH barriers and continued food struggles. Patient is participating in a Managed Medicaid Plan:Yes  Engaged with patient face to face for follow up visit for Heart and Vascular Care Coordination.                                                                                                   Assessment:  CSW met with pt and clinic team to figure out getting new prescriptions and taking them correctly.  Pt gets medications in bubble packs but has not means to take to the pharmacy to get new medications added and not due to get new pill packs until 2/6-2/7  CSW assisted in picking up new meds from [place] and preparing pill box for patient with the additional medications.  Pt able to teach back that he will take pill pack medications and 1 slot of medications from his pill box each morning until next visit.     Pt still has no updates on food stamps- csw provided with food bag from Heart and Vascular food pantry.   Left message for case worker to make sure she was aware that application was submited online and check about urgent approval given pt situation.  Pt did confirm he was able ot move back into [address] so hopefull will not have housing concerns in the near future.                              HRT/VAS Care Coordination     Outpatient Care Team Community Paramedicine; Community ACT Team   Community ACT Team Member Name: entered in error   Community Paramedic [facility]: [facility] 11/03/2021 for lack of communication   Social Worker Name: Tammy Sours- [facility]- [phone]   Living arrangements for the past 2 months Single Family Home   Lives with: Roommate   Patient Current Insurance Coverage Medicaid   Patient Has Concern With Paying Medical Bills No   Does Patient Have Prescription  Coverage? Yes   Home Assistive Devices/Equipment None   DME Agency [employer]   [place]       Social History:                                                                             SDOH Screenings   Food Insecurity: Food Insecurity Present (01/22/2023)  Housing: Low Risk  (10/02/2022)  Transportation Needs: Unmet Transportation Needs (01/03/2023)  Utilities: Not At Risk (10/02/2022)  Depression (PHQ2-9): Medium Risk (11/08/2022)  Financial Resource Strain: High Risk (01/22/2023)  Tobacco Use: Low Risk  (01/22/2023)  Follow-up plan:    CSW will continue to follow and assist as able  [name], [facility] Desk#: [phone] Cell#: [phone]

## 2023-01-22 NOTE — Patient Instructions (Signed)
Thank you for coming in today  Labs were done today, if any labs are abnormal the clinic will call you No news is good news  You were given metolazone 2.5 mg and 40 meq of potassium in the clinic today  INCREASE YOUR LASIX TO 80 MG DAILY INCREASE Potassium to 40 MEQ twice daily  Your physician recommends that you schedule a follow-up appointment in:  2-3 weeks in clinic     Do the following things EVERYDAY: Weigh yourself in the morning before breakfast. Write it down and keep it in a log. Take your medicines as prescribed Eat low salt foods--Limit salt (sodium) to 2000 mg per day.  Stay as active as you can everyday Limit all fluids for the day to less than 2 liters  At the [facility], you and your health needs are our priority. As part of our continuing mission to provide you with exceptional heart care, we have created designated Provider Care Teams. These Care Teams include your primary Cardiologist (physician) and Advanced Practice Providers (APPs- Physician Assistants and Nurse Practitioners) who all work together to provide you with the care you need, when you need it.   You may see any of the following providers on your designated Care Team at your next follow up: Dr Glori Bickers Dr Loralie Champagne Dr. Roxana Hires, NP Lyda Jester, [facility] [place], [place] Forestine Na, NP Audry Riles, PharmD   Please be sure to bring in all your medications bottles to every appointment.    Thank you for choosing [facility]   If you have any questions or concerns before your next appointment please send [place] a message through [place] or call our office at [phone].    TO LEAVE A MESSAGE FOR THE NURSE SELECT OPTION 2, PLEASE LEAVE A MESSAGE INCLUDING: YOUR NAME DATE OF BIRTH CALL BACK NUMBER REASON FOR CALL**this is important as we prioritize the call backs  YOU WILL RECEIVE A CALL  BACK THE SAME DAY AS LONG AS YOU CALL BEFORE 4:00 PM

## 2023-01-22 NOTE — Addendum Note (Signed)
Encounter addended by: [name], LCSW on: 01/22/2023 11:53 AM  Actions taken: Flowsheet accepted, Clinical Note Signed

## 2023-01-22 NOTE — Telephone Encounter (Signed)
H&V Care Navigation CSW Progress Note  Clinical Social Worker called in ride for pt to come to next appt on 2/14.  Pick up from home: 10:45am-11:15am Pick up from clinic 1pm Ride ID: [id number]  Patient is participating in a Managed Medicaid Plan:  Yes  [place]: Food Insecurity Present (01/22/2023)  Housing: Low Risk  (10/02/2022)  Transportation Needs: Unmet Transportation Needs (01/22/2023)  Utilities: Not At Risk (10/02/2022)  Depression (PHQ2-9): Medium Risk (11/08/2022)  Financial Resource Strain: High Risk (01/22/2023)  Tobacco Use: Low Risk  (01/22/2023)    [name], LCSW Clinical Social Worker [facility] Desk#: [phone] Cell#: [phone]

## 2023-01-29 ENCOUNTER — Other Ambulatory Visit (HOSPITAL_COMMUNITY): Payer: Self-pay | Admitting: Family Medicine

## 2023-01-29 ENCOUNTER — Telehealth (HOSPITAL_COMMUNITY): Payer: Self-pay

## 2023-01-29 NOTE — Telephone Encounter (Signed)
Surgical clearance for faxed on 01/26/23 to [facility]

## 2023-02-02 ENCOUNTER — Encounter: Payer: Self-pay | Admitting: Internal Medicine

## 2023-02-05 ENCOUNTER — Telehealth (HOSPITAL_COMMUNITY): Payer: Self-pay | Admitting: Licensed Clinical Social Worker

## 2023-02-05 NOTE — Telephone Encounter (Signed)
H&V Care Navigation CSW Progress Note  Clinical Social Worker called pt to remind of appt on Wednesday- pt appreciates the reminder as he did not remember- informed pt of ride details- he will plan to see [place] in clinic.  Patient is participating in a Managed Medicaid Plan:  Yes  [place]: Food Insecurity Present (01/22/2023)  Housing: Low Risk  (10/02/2022)  Transportation Needs: Unmet Transportation Needs (01/22/2023)  Utilities: Not At Risk (10/02/2022)  Depression (PHQ2-9): Medium Risk (11/08/2022)  Financial Resource Strain: High Risk (01/22/2023)  Tobacco Use: Low Risk  (01/22/2023)    [name], LCSW Clinical Social Worker [facility] Desk#: [phone] Cell#: [phone]

## 2023-02-06 NOTE — Progress Notes (Signed)
Advanced Heart Failure Clinic Note  PCP: Geryl Rankins, NP HF Cardiologist: Dr Haroldine Laws   Reason for Visit: F/u for HFrEF   HPI: [name] is a 62 y.o. male with h/o ETOH abuse, VF Arrest 2018, CAD s/p CABG 12/07/17, and ICM with EF 25-30%.   Admitted 12/18 with VF arrest while chopping wood. Received CPR in the field. R/LHC with severe 3vD as below and cardiogenic shock requiring IABP and milrinone. Required milrinone with continued shock. Pt improved and IABP removed 12/10, but pt had recurrent VF arrest so IABP replaced. Stabilized and underwent CABG 12/07/17. Tolerated gradual wean of meds and extubation with no further events.  Echo 5/20 EF at 25%.    9/20 implantation of a [employer] single chamber ICD.   He had routine clinic f/u on 10/23/19 and while in exam room had a witnessed seizure  with loss of pulse. CPR started with quick ROSC. He did not require shock. Taken to the ED and had another seizure. Neurology consulted. Started on Keppra. ICD interrogated. No arrhythmias noted. Admitted to [facility] on 12/09/19 with recurrent seizures in setting of noncompliance with his Keppra.    Admitted 8/21 with COVID PNA.   Follow up 5/22 he was having facial and arm tingling, no other neuro symptoms. Ethanol level 145. Carvedilol increased and carotid u/s ordered showing bilateral ICA 1-39%.  Follow up 9/22, he was off all meds. Resumed meds and re-enrolled with Paramedicine. Follow up 10/22, he remained off all meds, NYHA II. Meds restarted, but lost to follow up.   Follow up 09/11/22, off all meds except Keppra, felt bad and volume overloaded.  Attempted outpatient diuresis and titration of GDMT but admitted in 10/23 d/t a/c CHF.   Seen for f/u 12/12/22. Had been in [place] ED for belly pain and swelling. Says they gave him an Advil and sent him home. No records to review. Continued to endorse abdominal pain at f/u. Lipase, Tbili, indirect  bilirubin and direct bilirubin elevated. He was referred to GI for workup. Also appeared volume overloaded and IV lasix arranged in infusion clinic.   Today he returns for HF follow up. Overall feeling poorly. He is SOB with minimal activity, he is swelling more in legs and belly. Drank 2 beers yesterday. He is dizzy and fell going up steps recently. Denies CP, abnormal bleeding or PND/Orthopnea. Appetite ok. No fever or chills. Weight at home 151 pounds. Taking all medications. Receives rides to clinic through [place]. Has not received food stamps. He is now homeless.  ROS: All systems negative except as listed in HPI, PMH and Problem List.  SH:  Social History   Socioeconomic History   Marital status: Divorced    Spouse name: Not on file   Number of children: Not on file   Years of education: Not on file   Highest education level: Not on file  Occupational History   Not on file  Tobacco Use   Smoking status: Never   Smokeless tobacco: Never  Vaping Use   Vaping Use: Never used  Substance and Sexual Activity   Alcohol use: Yes    Alcohol/week: 14.0 standard drinks of alcohol    Types: 14 Cans of beer per week    Comment: patient endorses at least 1-2 beers daily (04/2020)   Drug use: No   Sexual activity: Not Currently  Other  Topics Concern   Not on file  Social History Narrative   Lives at home with his brother who is crippled. He takes care of his brother.    Right handed    Social Determinants of Health   Financial Resource Strain: High Risk (01/22/2023)   Overall Financial Resource Strain (CARDIA)    Difficulty of Paying Living Expenses: Very hard  Food Insecurity: Food Insecurity Present (01/22/2023)   Hunger Vital Sign    Worried About Running Out of Food in the Last Year: Often true    Ran Out of Food in the Last Year: Often true  Transportation Needs: Unmet Transportation Needs (01/22/2023)   PRAPARE - Hydrologist (Medical): Yes     Lack of Transportation (Non-Medical): Yes  Physical Activity: Not on file  Stress: Not on file  Social Connections: Not on file  Intimate Partner Violence: Not on file   FH:  Family History  Problem Relation Age of Onset   Heart attack Other    Diabetes Neg Hx    Past Medical History:  Diagnosis Date   AICD (automatic cardioverter/defibrillator) present    Alcohol abuse 05/18/2020   Arthritis    "hands; maybe my toes" (12/20/2017)   CAD (coronary artery disease)    Cardiac arrest ([place]) 11/29/2017   hx VF arrest/notes 12/20/2017   CHF (congestive heart failure) ([place])    Chronic systolic heart failure ([place]) 08/25/2019   Coronary artery disease    Coronary artery disease involving native coronary artery of native heart without angina pectoris 12/07/2017   Essential hypertension 05/18/2020   GERD (gastroesophageal reflux disease)    H/O ETOH abuse    /notes 12/20/2017   High cholesterol    Hypertension    Ischemic cardiomyopathy    a. 08/2019 s/p BSX D232 single lead AICD.   Mixed hyperlipidemia 05/18/2020   Seizure ([place]) 10/21/2019   Seizures ([place]) ~ 2016; ~ 2017   "I've had a couple; I was at work" (12/20/2017)   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, 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 ([place]) 12/30/2019   Syncope and collapse    "last few days" (12/20/2017)   Current Outpatient Medications  Medication Sig Dispense Refill   aspirin EC 81 MG tablet Take 1 tablet (81 mg total) by mouth daily. Swallow whole. 30 tablet 11   atorvastatin (LIPITOR) 80 MG tablet Take 1 tablet (80 mg total) by mouth in the morning. 30 tablet 11   empagliflozin (JARDIANCE) 10 MG TABS tablet Take 1 tablet (10 mg total) by mouth daily. 30 tablet 11   furosemide (LASIX) 40 MG tablet TAKE 1 TABLET (40 MG TOTAL) BY MOUTH DAILY. (AM) 30 tablet 0   levETIRAcetam (KEPPRA) 500 MG tablet TAKE 1 TABLET BY MOUTH 2 TIMES DAILY 180 tablet 3   losartan (COZAAR) 25 MG tablet Take 1 tablet  (25 mg total) by mouth daily. 90 tablet 3   metoprolol succinate (TOPROL XL) 25 MG 24 hr tablet Take 0.5 tablets (12.5 mg total) by mouth daily. 15 tablet 7   potassium chloride SA (KLOR-CON M) 20 MEQ tablet Take 4 tablets (80 mEq total) by mouth daily. 60 tablet 0   No current facility-administered medications for this visit.   There were no vitals taken for this visit.  Wt Readings from Last 3 Encounters:  01/22/23 70.1 kg (154 lb 9.6 oz)  12/27/22 67.6 kg (149 lb)  12/12/22 66.7 kg (147 lb)   PHYSICAL EXAM: General:  NAD. No resp difficulty, thin, chronically-ill appearing. HEENT: Normal Neck: Supple. JVP to ear Carotids 2+ bilat; no bruits. No lymphadenopathy or thryomegaly appreciated. Cor: PMI nondisplaced. Regular rate & rhythm. No rubs, gallops or murmurs. Lungs: Diminished Abdomen: + distended, nontender. No hepatosplenomegaly. No bruits or masses. Good bowel sounds. Extremities: No cyanosis, clubbing, rash, 1-2+ BLE edema R>L Neuro: Alert & oriented x 3, cranial nerves grossly intact. Moves all 4 extremities w/o difficulty. Affect pleasant.  Device interrogation: HL index 11, thoracic impedance down, activity level 3.5 hrs/day, average HR 101 bpm, no shocks.  ASSESSMENT & PLAN: 1. Acute on chronic Biventricular HF, ICM  - Echo 11/30/17 25-30% RV normal.   - Echo 03/2018: EF 40-45%  RV normal.  - Echo 5/20 EF ~ 25%. Has ICD.  - Echo 12/2019 EF 30-35% RV normal. - Mixed Ischemic + probable ETOH mediated component  - Echo (10/23): biventricular failure getting worse from LVEF 30-35% Mod RV--> LVEF < 20% severely reduced RV.  - Has [employer] ICD.  - GDMT has been limited given compliance and hypotension. Will try to keep regimen simple. Do not recommend BID meds. - NYHA IIIb. Volume overloaded on exam and device interrogation.  - Increase Lasix to 80 mg daily, increase KCL to 80 daily. - He was given 2.5 mg metolazone in the clinic + 40 mEq potassium. Do not believe he  can manage taking extra diuretic PRN on his own. - Continue Toprol XL 12.5 mg daily. - Continue Jardiance 10 mg daily.  - Continue losartan 25 mg daily.  - Now off digoxin d/t elevated level. - No spiro with h/o gynecomastia. Could consider Inspra but I am concerned about his follow up.  - Not a candidate for advanced therapies/transplant with ongoing ETOH/substance abuse and compliance (this includes home inotrope).  - Labs today.   2. CAD - S/P CABG x 3 2018  - No chest pain - Continue aspirin + atorvastatin.   3. Seizure Disorder - On Keppra.    4. ETOH Abuse - Reports drinking only 1-2 beers/day. Suspect he consumes more than this. - Discussed cessation.  - Appears drunk today during visit.  5. Abdominal pain - Lipase, Tbili, direct and indirect bili elevated on recent labs - He has been referred to GI.  6. SDOH - Very difficult to manage with ongoing substance abuse and noncompliance.  - Have not been able to enroll in Paramedicine, the team has been unable to maintain contact with him.  - He has been followed by HF CSW [place]. Meds now bubble packed which seems to be helping with adherence. Also has transportation assistance. Working on Research scientist (physical sciences).  Follow up in 2-3 weeks with APP. If he remains volume overloaded next visit refractory to escalation of diuretics, will likely need admission.  Allena Katz, FNP-BC 02/06/23

## 2023-02-07 ENCOUNTER — Ambulatory Visit (HOSPITAL_COMMUNITY)
Admission: RE | Admit: 2023-02-07 | Discharge: 2023-02-07 | Disposition: A | Discharge status: Home / Self Care | Payer: Medicaid Other | Source: Ambulatory Visit | Attending: Family Medicine | Admitting: Family Medicine

## 2023-02-07 ENCOUNTER — Encounter (HOSPITAL_COMMUNITY): Payer: Self-pay

## 2023-02-07 VITALS — BP 102/70 | HR 92 | Wt 149.6 lb

## 2023-02-07 DIAGNOSIS — R109 Unspecified abdominal pain: Secondary | ICD-10-CM | POA: Diagnosis not present

## 2023-02-07 DIAGNOSIS — I5022 Chronic systolic (congestive) heart failure: Secondary | ICD-10-CM

## 2023-02-07 DIAGNOSIS — Z7984 Long term (current) use of oral hypoglycemic drugs: Secondary | ICD-10-CM | POA: Diagnosis not present

## 2023-02-07 DIAGNOSIS — Z91148 Patient's other noncompliance with medication regimen for other reason: Secondary | ICD-10-CM

## 2023-02-07 DIAGNOSIS — Z8674 Personal history of sudden cardiac arrest: Secondary | ICD-10-CM | POA: Diagnosis not present

## 2023-02-07 DIAGNOSIS — G40909 Epilepsy, unspecified, not intractable, without status epilepticus: Secondary | ICD-10-CM | POA: Diagnosis not present

## 2023-02-07 DIAGNOSIS — I110 Hypertensive heart disease with heart failure: Secondary | ICD-10-CM | POA: Insufficient documentation

## 2023-02-07 DIAGNOSIS — F1010 Alcohol abuse, uncomplicated: Secondary | ICD-10-CM | POA: Insufficient documentation

## 2023-02-07 DIAGNOSIS — Z91199 Patient's noncompliance with other medical treatment and regimen due to unspecified reason: Secondary | ICD-10-CM | POA: Insufficient documentation

## 2023-02-07 DIAGNOSIS — Z5941 Food insecurity: Secondary | ICD-10-CM | POA: Insufficient documentation

## 2023-02-07 DIAGNOSIS — R569 Unspecified convulsions: Secondary | ICD-10-CM

## 2023-02-07 DIAGNOSIS — Z951 Presence of aortocoronary bypass graft: Secondary | ICD-10-CM | POA: Diagnosis not present

## 2023-02-07 DIAGNOSIS — R42 Dizziness and giddiness: Secondary | ICD-10-CM | POA: Diagnosis not present

## 2023-02-07 DIAGNOSIS — R0989 Other specified symptoms and signs involving the circulatory and respiratory systems: Secondary | ICD-10-CM | POA: Diagnosis not present

## 2023-02-07 DIAGNOSIS — Z79899 Other long term (current) drug therapy: Secondary | ICD-10-CM | POA: Insufficient documentation

## 2023-02-07 DIAGNOSIS — Z7982 Long term (current) use of aspirin: Secondary | ICD-10-CM | POA: Insufficient documentation

## 2023-02-07 DIAGNOSIS — I5082 Biventricular heart failure: Secondary | ICD-10-CM | POA: Diagnosis present

## 2023-02-07 DIAGNOSIS — I2510 Atherosclerotic heart disease of native coronary artery without angina pectoris: Secondary | ICD-10-CM | POA: Insufficient documentation

## 2023-02-07 LAB — BASIC METABOLIC PANEL
Anion gap: 13 (ref 5–15)
BUN: 12 mg/dL (ref 6–20)
CO2: 26 mmol/L (ref 22–32)
Calcium: 9.1 mg/dL (ref 8.9–10.3)
Chloride: 97 mmol/L — ABNORMAL LOW (ref 98–111)
Creatinine, Ser: 1.04 mg/dL (ref 0.61–1.24)
GFR, Estimated: >60 mL/min (ref >60)
Glucose, Bld: 76 mg/dL (ref 70–99)
Potassium: 3.4 mmol/L — ABNORMAL LOW (ref 3.5–5.1)
Sodium: 136 mmol/L (ref 135–145)

## 2023-02-07 LAB — BRAIN NATRIURETIC PEPTIDE: B Natriuretic Peptide: 1658 pg/mL — ABNORMAL HIGH (ref 0.0–100.0)

## 2023-02-07 MED ORDER — TORSEMIDE 20 MG PO TABS: 80.0000 mg | ORAL_TABLET | Freq: Two times a day (BID) | ORAL | 3 refills | Status: DC

## 2023-02-07 MED ORDER — TORSEMIDE 20 MG PO TABS: 80.0000 mg | ORAL_TABLET | Freq: Every day | ORAL | 11 refills | Status: DC

## 2023-02-07 MED ORDER — POTASSIUM CHLORIDE CRYS ER 20 MEQ PO TBCR: 80.0000 meq | EXTENDED_RELEASE_TABLET | Freq: Every day | ORAL | 8 refills | Status: DC

## 2023-02-07 NOTE — Patient Instructions (Signed)
Thank you for coming in today  .Labs were done today, if any labs are abnormal the clinic will call you No news is good news  Your physician recommends that you schedule a follow-up appointment in: 3-4 weeks in clinic  12 weeks with Dr. Haroldine Laws  You will receive a reminder letter in the mail a few months in advance. If you don't receive a letter, please call our office to schedule the follow-up appointment.     Do the following things EVERYDAY: Weigh yourself in the morning before breakfast. Write it down and keep it in a log. Take your medicines as prescribed Eat low salt foods--Limit salt (sodium) to 2000 mg per day.  Stay as active as you can everyday Limit all fluids for the day to less than 2 liters  At the [facility], you and your health needs are our priority. As part of our continuing mission to provide you with exceptional heart care, we have created designated Provider Care Teams. These Care Teams include your primary Cardiologist (physician) and Advanced Practice Providers (APPs- Physician Assistants and Nurse Practitioners) who all work together to provide you with the care you need, when you need it.   You may see any of the following providers on your designated Care Team at your next follow up: Dr Glori Bickers Dr Loralie Champagne Dr. Roxana Hires, NP Lyda Jester, [place] [facility] [place], [place] Forestine Na, NP Audry Riles, PharmD   Please be sure to bring in all your medications bottles to every appointment.    Thank you for choosing [facility]   If you have any questions or concerns before your next appointment please send [place] a message through [place] or call our office at [phone].    TO LEAVE A MESSAGE FOR THE NURSE SELECT OPTION 2, PLEASE LEAVE A MESSAGE INCLUDING: YOUR NAME DATE OF BIRTH CALL BACK NUMBER REASON FOR CALL**this is important as we prioritize the  call backs  YOU WILL RECEIVE A CALL BACK THE SAME DAY AS LONG AS YOU CALL BEFORE 4:00 PM

## 2023-02-07 NOTE — Addendum Note (Signed)
Encounter addended by: Payton Mccallum, RN on: 02/07/2023 1:55 PM  Actions taken: Pharmacy for encounter modified, Order list changed

## 2023-02-07 NOTE — Progress Notes (Signed)
H&V Care Navigation CSW Progress Note  Clinical Social Worker assisted pt in getting his medications from pharmacy and providing to patient.  Also provided with food bag.  Patient is participating in a Managed Medicaid Plan:  Yes  [place]: Food Insecurity Present (01/22/2023)  Housing: Low Risk  (10/02/2022)  Transportation Needs: Unmet Transportation Needs (01/22/2023)  Utilities: Not At Risk (10/02/2022)  Depression (PHQ2-9): Medium Risk (11/08/2022)  Financial Resource Strain: High Risk (01/22/2023)  Tobacco Use: Low Risk  (02/07/2023)    [name], LCSW Clinical Social Worker [facility] Desk#: [phone] Cell#: [phone]

## 2023-02-07 NOTE — Addendum Note (Signed)
Encounter addended by: [name], LCSW on: 02/07/2023 2:06 PM  Actions taken: Clinical Note Signed

## 2023-02-08 ENCOUNTER — Telehealth (HOSPITAL_COMMUNITY): Payer: Self-pay | Admitting: Licensed Clinical Social Worker

## 2023-02-08 NOTE — Telephone Encounter (Signed)
H&V Care Navigation CSW Progress Note  Clinical Social Worker called Comptroller in [place] to confirm if they received the patients medical clearance and would be able to schedule him for follow up work.  They confirm that clearance was received and that their Dentist was reviewing and then someone would call me to schedule his appt.  If I don't hear back I will plan to follow up in a week.  Patient is participating in a Managed Medicaid Plan:  Yes  [place]: Food Insecurity Present (01/22/2023)  Housing: Low Risk  (10/02/2022)  Transportation Needs: Unmet Transportation Needs (01/22/2023)  Utilities: Not At Risk (10/02/2022)  Depression (PHQ2-9): Medium Risk (11/08/2022)  Financial Resource Strain: High Risk (01/22/2023)  Tobacco Use: Low Risk  (02/07/2023)     [name], LCSW Clinical Social Worker [facility] Desk#: [phone] Cell#: [phone]

## 2023-02-09 ENCOUNTER — Ambulatory Visit: Payer: Medicaid Other | Admitting: Nurse Practitioner

## 2023-02-09 ENCOUNTER — Telehealth (HOSPITAL_COMMUNITY): Payer: Self-pay | Admitting: Surgery

## 2023-02-09 NOTE — Telephone Encounter (Signed)
-----   Message from Rafael Bihari, [place] sent at 02/09/2023  8:02 AM EST ----- BNP elevated, diuretics adjusted at appt.  K is low, please increase KCL to 80 q am and add 40 to q pm.   Please repeat BMET in 10 days.

## 2023-02-09 NOTE — Telephone Encounter (Signed)
Patient no longer has this telephone and has medications prepared in pill packs.. Discussed with [place] Worker is calling patient's friend to attempt to reach patient and to arrange transportation for follow up appt in our clinic next Friday.  Patient's pill packs will be reconciled at that time with increase in Potassium dosage.

## 2023-02-12 ENCOUNTER — Telehealth (HOSPITAL_COMMUNITY): Payer: Self-pay | Admitting: Licensed Clinical Social Worker

## 2023-02-12 NOTE — Telephone Encounter (Signed)
H&V Care Navigation CSW Progress Note  Clinical Social Worker contacted by [employer] worker trying to finalize pt food stamp application.  CSW assisted in connecting with pt through a friends phone and he answered questions appropriately.  They need proof of rent- states he can get a letter from his friend and will text a picture to me and I can email to case worker- if he can't get this application will be processed without living expenses taken into account which would result in less benefits.  Patient is participating in a Managed Medicaid Plan:  Yes  [place]: Food Insecurity Present (02/12/2023)  Housing: Low Risk  (10/02/2022)  Transportation Needs: Unmet Transportation Needs (01/22/2023)  Utilities: Not At Risk (10/02/2022)  Depression (PHQ2-9): Medium Risk (11/08/2022)  Financial Resource Strain: High Risk (01/22/2023)  Tobacco Use: Low Risk  (02/07/2023)    [name], LCSW Clinical Social Worker [facility] Desk#: [phone] Cell#: [phone]

## 2023-02-14 ENCOUNTER — Telehealth (HOSPITAL_COMMUNITY): Payer: Self-pay | Admitting: Licensed Clinical Social Worker

## 2023-02-14 ENCOUNTER — Other Ambulatory Visit (HOSPITAL_COMMUNITY): Payer: Self-pay

## 2023-02-14 MED ORDER — POTASSIUM CHLORIDE CRYS ER 20 MEQ PO TBCR: 40.0000 meq | EXTENDED_RELEASE_TABLET | Freq: Every day | ORAL | 0 refills | Status: DC

## 2023-02-14 NOTE — Telephone Encounter (Signed)
H&V Care Navigation CSW Progress Note  Clinical Social Worker assisting in making med changes for patient.  Pt needs 2 more potassium a day due to low potassium levels- paramedic will make one time visit to home to make changes for patient.  Patient is participating in a Managed Medicaid Plan:  Yes  [place]: Food Insecurity Present (02/12/2023)  Housing: Low Risk  (10/02/2022)  Transportation Needs: Unmet Transportation Needs (01/22/2023)  Utilities: Not At Risk (10/02/2022)  Depression (PHQ2-9): Medium Risk (11/08/2022)  Financial Resource Strain: High Risk (01/22/2023)  Tobacco Use: Low Risk  (02/07/2023)    [name], LCSW Clinical Social Worker [facility] Desk#: [phone] Cell#: [phone]

## 2023-02-15 ENCOUNTER — Other Ambulatory Visit (HOSPITAL_COMMUNITY): Payer: Self-pay

## 2023-02-15 ENCOUNTER — Telehealth (HOSPITAL_COMMUNITY): Payer: Self-pay | Admitting: Licensed Clinical Social Worker

## 2023-02-15 NOTE — Telephone Encounter (Signed)
H&V Care Navigation CSW Progress Note  Community Paramedic making one time visit to pt to make recommended med changes and bring new medication.  Pt unable to afford copay for medication as it was filled earlier than insurance would allow- CSW able to assist with this cost.  Patient is participating in a Managed Medicaid Plan:  Yes  [place]: Food Insecurity Present (02/12/2023)  Housing: Low Risk  (10/02/2022)  Transportation Needs: Unmet Transportation Needs (01/22/2023)  Utilities: Not At Risk (10/02/2022)  Depression (PHQ2-9): Medium Risk (11/08/2022)  Financial Resource Strain: High Risk (01/22/2023)  Tobacco Use: Low Risk  (02/07/2023)    [name], [facility] Desk#: [phone] Cell#: [phone]

## 2023-02-15 NOTE — Progress Notes (Signed)
Paramedicine Encounter    Patient ID: [name], male    DOB: [birth year], 62 y.o.   MRN: [id number]  Picked up Potassium from [place] and delivered to [name]. Met with [name] at his home today for one time visit where we discussed his lab results and I placed extra potassium in pill box for him per HF clinic providers. He was educated on med compliance and how to take his bubble packs and the additional potassium in the pill box. This was set up for two weeks to get him through to his next HF clinic appointment. Ko[place] appreciated the assistance. Visit complete.   ACTION: Home visit completed

## 2023-02-16 ENCOUNTER — Encounter (HOSPITAL_COMMUNITY): Payer: Medicaid Other

## 2023-02-19 ENCOUNTER — Telehealth (HOSPITAL_COMMUNITY): Payer: Self-pay | Admitting: Licensed Clinical Social Worker

## 2023-02-19 NOTE — Telephone Encounter (Signed)
H&V Care Navigation CSW Progress Note  Clinical Social Worker received letter from pt landlord stating his rent each month- CSW forwarded this letter to the [facility] case worker who is processing pts food stamp application.  Letter was due by 2/21 so unsure if they will take this into account for his benefits but CSW asked if it could at least be put in for consideration for the next month of benefits if not right now- will await response.  Patient is participating in a Managed Medicaid Plan:  Yes  [place]: Food Insecurity Present (02/12/2023)  Housing: Low Risk  (10/02/2022)  Transportation Needs: Unmet Transportation Needs (01/22/2023)  Utilities: Not At Risk (10/02/2022)  Depression (PHQ2-9): Medium Risk (11/08/2022)  Financial Resource Strain: High Risk (02/15/2023)  Tobacco Use: Low Risk  (02/07/2023)    [name], LCSW Clinical Social Worker [facility] Desk#: [phone] Cell#: [phone]

## 2023-02-23 ENCOUNTER — Telehealth (HOSPITAL_COMMUNITY): Payer: Self-pay | Admitting: Licensed Clinical Social Worker

## 2023-02-23 NOTE — Telephone Encounter (Signed)
H&V Care Navigation CSW Progress Note  Clinical Social Worker assisting in setting up transport to upcoming clinic appt through Medicaid  Pick up from home: 1:15-2:45pm Pick up from appt: 3:30-4pm Ref #: [id number]  Patient is participating in a Managed Medicaid Plan:  Yes  [place]: Food Insecurity Present (02/12/2023)  Housing: Low Risk  (10/02/2022)  Transportation Needs: Unmet Transportation Needs (02/23/2023)  Utilities: Not At Risk (10/02/2022)  Depression (PHQ2-9): Medium Risk (11/08/2022)  Financial Resource Strain: High Risk (02/15/2023)  Tobacco Use: Low Risk  (02/07/2023)   [name], [facility] Desk#: [phone] Cell#: [phone]

## 2023-02-27 ENCOUNTER — Telehealth (HOSPITAL_COMMUNITY): Payer: Self-pay | Admitting: Licensed Clinical Social Worker

## 2023-02-27 NOTE — Telephone Encounter (Signed)
H&V Care Navigation CSW Progress Note  Clinical Social Worker called Dr. Stefanie Libel oral surgeon about scheduling pt for oral surgery consult/eval.  No availability until June 5th at 1pm so CSW booked appt.  Will continue to follow up to see if appts available any earlier.  CSW attempted to call pt at friends phone- left VM to inform of above and of transport details for appt thursday  Patient is participating in a Managed Medicaid Plan:  Yes  [place]: Food Insecurity Present (02/12/2023)  Housing: Low Risk  (10/02/2022)  Transportation Needs: Unmet Transportation Needs (02/23/2023)  Utilities: Not At Risk (10/02/2022)  Depression (PHQ2-9): Medium Risk (11/08/2022)  Financial Resource Strain: High Risk (02/15/2023)  Tobacco Use: Low Risk  (02/07/2023)   [name], [facility] Desk#: [phone] Cell#: [phone]

## 2023-02-28 ENCOUNTER — Telehealth (HOSPITAL_COMMUNITY): Payer: Self-pay

## 2023-02-28 ENCOUNTER — Telehealth (HOSPITAL_COMMUNITY): Payer: Self-pay | Admitting: Licensed Clinical Social Worker

## 2023-02-28 NOTE — Telephone Encounter (Signed)
Called to confirm/remind patient of their appointment at the [facility] on 03/01/23. However, patient's phone numbers were not in service.

## 2023-02-28 NOTE — Telephone Encounter (Signed)
H&V Care Navigation CSW Progress Note  Clinical Social Worker called pt friend to remind of appt tomorrow- Friend provided reminder and details of pick up- she will inform patient  Patient is participating in a Managed Medicaid Plan:  Yes  [place]: Food Insecurity Present (02/12/2023)  Housing: Low Risk  (10/02/2022)  Transportation Needs: Unmet Transportation Needs (02/23/2023)  Utilities: Not At Risk (10/02/2022)  Depression (PHQ2-9): Medium Risk (11/08/2022)  Financial Resource Strain: High Risk (02/15/2023)  Tobacco Use: Low Risk  (02/07/2023)    [name], [facility] Desk#: [phone] Cell#: [phone]

## 2023-03-01 ENCOUNTER — Telehealth (HOSPITAL_COMMUNITY): Payer: Self-pay | Admitting: Licensed Clinical Social Worker

## 2023-03-01 ENCOUNTER — Encounter (HOSPITAL_COMMUNITY): Payer: Self-pay

## 2023-03-01 ENCOUNTER — Ambulatory Visit (HOSPITAL_COMMUNITY)
Admission: RE | Admit: 2023-03-01 | Discharge: 2023-03-01 | Disposition: A | Discharge status: Home / Self Care | Payer: Medicaid Other | Source: Ambulatory Visit | Attending: Cardiology | Admitting: Cardiology

## 2023-03-01 VITALS — BP 100/60 | HR 88 | Wt 142.4 lb

## 2023-03-01 DIAGNOSIS — R17 Unspecified jaundice: Secondary | ICD-10-CM | POA: Diagnosis not present

## 2023-03-01 DIAGNOSIS — F1010 Alcohol abuse, uncomplicated: Secondary | ICD-10-CM | POA: Diagnosis not present

## 2023-03-01 DIAGNOSIS — Z8674 Personal history of sudden cardiac arrest: Secondary | ICD-10-CM | POA: Diagnosis not present

## 2023-03-01 DIAGNOSIS — I5022 Chronic systolic (congestive) heart failure: Secondary | ICD-10-CM | POA: Diagnosis not present

## 2023-03-01 DIAGNOSIS — I5082 Biventricular heart failure: Secondary | ICD-10-CM | POA: Diagnosis not present

## 2023-03-01 DIAGNOSIS — Z79899 Other long term (current) drug therapy: Secondary | ICD-10-CM | POA: Diagnosis not present

## 2023-03-01 DIAGNOSIS — R109 Unspecified abdominal pain: Secondary | ICD-10-CM | POA: Diagnosis not present

## 2023-03-01 DIAGNOSIS — Z91199 Patient's noncompliance with other medical treatment and regimen due to unspecified reason: Secondary | ICD-10-CM | POA: Insufficient documentation

## 2023-03-01 DIAGNOSIS — G40909 Epilepsy, unspecified, not intractable, without status epilepticus: Secondary | ICD-10-CM | POA: Insufficient documentation

## 2023-03-01 DIAGNOSIS — Z7982 Long term (current) use of aspirin: Secondary | ICD-10-CM | POA: Insufficient documentation

## 2023-03-01 DIAGNOSIS — I2510 Atherosclerotic heart disease of native coronary artery without angina pectoris: Secondary | ICD-10-CM | POA: Diagnosis not present

## 2023-03-01 DIAGNOSIS — Z951 Presence of aortocoronary bypass graft: Secondary | ICD-10-CM | POA: Insufficient documentation

## 2023-03-01 DIAGNOSIS — Z7984 Long term (current) use of oral hypoglycemic drugs: Secondary | ICD-10-CM | POA: Insufficient documentation

## 2023-03-01 DIAGNOSIS — I110 Hypertensive heart disease with heart failure: Secondary | ICD-10-CM | POA: Diagnosis present

## 2023-03-01 DIAGNOSIS — I959 Hypotension, unspecified: Secondary | ICD-10-CM | POA: Diagnosis not present

## 2023-03-01 LAB — COMPREHENSIVE METABOLIC PANEL
ALT: 17 U/L (ref 0–44)
AST: 29 U/L (ref 15–41)
Albumin: 3.7 g/dL (ref 3.5–5.0)
Alkaline Phosphatase: 118 U/L (ref 38–126)
Anion gap: 13 (ref 5–15)
BUN: 23 mg/dL — ABNORMAL HIGH (ref 6–20)
CO2: 23 mmol/L (ref 22–32)
Calcium: 9.1 mg/dL (ref 8.9–10.3)
Chloride: 98 mmol/L (ref 98–111)
Creatinine, Ser: 1.41 mg/dL — ABNORMAL HIGH (ref 0.61–1.24)
GFR, Estimated: 57 mL/min — ABNORMAL LOW (ref >60)
Glucose, Bld: 75 mg/dL (ref 70–99)
Potassium: 4.8 mmol/L (ref 3.5–5.1)
Sodium: 134 mmol/L — ABNORMAL LOW (ref 135–145)
Total Bilirubin: 2 mg/dL — ABNORMAL HIGH (ref 0.3–1.2)
Total Protein: 7.3 g/dL (ref 6.5–8.1)

## 2023-03-01 LAB — BRAIN NATRIURETIC PEPTIDE: B Natriuretic Peptide: 1200.3 pg/mL — ABNORMAL HIGH (ref 0.0–100.0)

## 2023-03-01 NOTE — Patient Instructions (Addendum)
It was great to see you today! No medication changes are needed at this time.   Labs today We will only contact you if something comes back abnormal or we need to make some changes. Otherwise no news is good news!  You have been referred to [facility] GI Appointment: March 05, 2023 @ 2:00pm Address: [address], [place], [place] [address] Phone: [phone]  You have been referred to [facility] for an abdominal ultrasound -they will be in contact with an appointment  Your physician recommends that you schedule a follow-up appointment in: 4-6 weeks  in the Advanced Practitioners (PA/NP) Clinic   Do the following things EVERYDAY: Weigh yourself in the morning before breakfast. Write it down and keep it in a log. Take your medicines as prescribed Eat low salt foods--Limit salt (sodium) to 2000 mg per day.  Stay as active as you can everyday Limit all fluids for the day to less than 2 liters  At the [facility], you and your health needs are our priority. As part of our continuing mission to provide you with exceptional heart care, we have created designated Provider Care Teams. These Care Teams include your primary Cardiologist (physician) and Advanced Practice Providers (APPs- Physician Assistants and Nurse Practitioners) who all work together to provide you with the care you need, when you need it.   You may see any of the following providers on your designated Care Team at your next follow up: Dr Glori Bickers Dr Loralie Champagne Dr. Roxana Hires, NP Lyda Jester, [place] [facility] [place], [place] Forestine Na, NP Audry Riles, PharmD   Please be sure to bring in all your medications bottles to every appointment.    Thank you for choosing [facility]

## 2023-03-01 NOTE — Telephone Encounter (Signed)
H&V Care Navigation CSW Progress Note  Clinical Social Worker met with pt in clinic to check in.  Pt brought paperwork from [facility] stating he will now be getting food stamps again- $139/month.  Pt will try out SNAP card and let CSW know if it doesn't work.  Pt needing transport to GI appt on Monday and ultrasound on Wednesday:  Pick up from home: 12:45-1:15pm Pick up from appt: 3:15-3:45pm Ride ID: [id number]   Pick up from home: 8:15-8:45am Need will call as don't know the length of the procedure Ride ID: [id number]  Above info provided to pt  Patient is participating in a Managed Medicaid Plan:  Yes  [place]: Food Insecurity Present (02/12/2023)  Housing: Low Risk  (10/02/2022)  Transportation Needs: Unmet Transportation Needs (03/01/2023)  Utilities: Not At Risk (10/02/2022)  Depression (PHQ2-9): Medium Risk (11/08/2022)  Financial Resource Strain: High Risk (02/15/2023)  Tobacco Use: Low Risk  (03/01/2023)    [name], [place] Social Worker [facility] Desk#: [phone] Cell#: [phone]

## 2023-03-01 NOTE — Progress Notes (Signed)
Advanced Heart Failure Clinic Note  PCP: Geryl Rankins, NP HF Cardiologist: Dr Haroldine Laws   Reason for Visit: F/u for HFrEF   HPI: [name] is a 62 y.o. male with h/o ETOH abuse, VF Arrest 2018, CAD s/p CABG 12/07/17, and ICM with EF 25-30%.   Admitted 12/18 with VF arrest while chopping wood. Received CPR in the field. R/LHC with severe 3vD as below and cardiogenic shock requiring IABP and milrinone. Required milrinone with continued shock. Pt improved and IABP removed 12/10, but pt had recurrent VF arrest so IABP replaced. Stabilized and underwent CABG 12/07/17. Tolerated gradual wean of meds and extubation with no further events.  Echo 5/20 EF at 25%.    9/20 implantation of a [employer] single chamber ICD.   He had routine clinic f/u on 10/23/19 and while in exam room had a witnessed seizure  with loss of pulse. CPR started with quick ROSC. He did not require shock. Taken to the ED and had another seizure. Neurology consulted. Started on Keppra. ICD interrogated. No arrhythmias noted. Ad[facility] to [facility] on 12/09/19 with recurrent seizures in setting of noncompliance with his Keppra.    Admitted 8/21 with COVID PNA.   Follow up 5/22 he was having facial and arm tingling, no other neuro symptoms. Ethanol level 145. Carvedilol increased and carotid u/s ordered showing bilateral ICA 1-39%.  Follow up 9/22, he was off all meds. Resumed meds and re-enrolled with Paramedicine. Follow up 10/22, he remained off all meds, NYHA II. Meds restarted, but lost to follow up.   Follow up 09/11/22, off all meds except Keppra, felt bad and volume overloaded.  Attempted outpatient diuresis and titration of GDMT but admitted in 10/23 d/t a/c CHF.   Seen for f/u 12/12/22. Had been in [place] ED for belly pain and swelling. Says they gave him an Advil and sent him home. No records to review. Continued to endorse abdominal pain at f/u. Lipase, Tbili, indirect  bilirubin and direct bilirubin elevated. He was referred to GI for workup. Also appeared volume overloaded and IV lasix arranged in infusion clinic.   Had f/u here in the [facility] 2/14 and was volume overloaded w/ NYHA IIIb symptoms. HL score 27. Continued abdominal swelling. Lasix was discontinued and he was started on torsemide 80 mg daily.   He presents back today for f/u to reassess volume status. Wt down 7 lb. HL score down to 3. No VT on device interrogation. No LEE but c/w abdominal distention, n/v. Says he is no longer drinking but history is not reliable. No resting dyspnea but chronically NYHA Class II. No CP. BP soft 100/60. No orthostatic symptoms but feels tired.     ROS: All systems negative except as listed in HPI, PMH and Problem List.  SH:  Social History   Socioeconomic History   Marital status: Divorced    Spouse name: Not on file   Number of children: Not on file   Years of education: Not on file   Highest education level: Not on file  Occupational History   Not on file  Tobacco Use   Smoking status: Never   Smokeless tobacco: Never  Vaping Use   Vaping Use: Never used  Substance and Sexual Activity   Alcohol use: Yes    Alcohol/week: 14.0 standard drinks of alcohol    Types: 14 Cans of beer  per week    Comment: patient endorses at least 1-2 beers daily (04/2020)   Drug use: No   Sexual activity: Not Currently  Other Topics Concern   Not on file  Social History Narrative   Lives at home with his brother who is crippled. He takes care of his brother.    Right handed    Social Determinants of Health   Financial Resource Strain: High Risk (02/15/2023)   Overall Financial Resource Strain (CARDIA)    Difficulty of Paying Living Expenses: Very hard  Food Insecurity: Food Insecurity Present (02/12/2023)   Hunger Vital Sign    Worried About Running Out of Food in the Last Year: Often true    Ran Out of Food in the Last Year: Often true  Transportation Needs: Unmet  Transportation Needs (02/23/2023)   PRAPARE - Hydrologist (Medical): Yes    Lack of Transportation (Non-Medical): Yes  Physical Activity: Not on file  Stress: Not on file  Social Connections: Not on file  Intimate Partner Violence: Not on file   FH:  Family History  Problem Relation Age of Onset   Heart attack Other    Diabetes Neg Hx    Past Medical History:  Diagnosis Date   AICD (automatic cardioverter/defibrillator) present    Alcohol abuse 05/18/2020   Arthritis    "hands; maybe my toes" (12/20/2017)   CAD (coronary artery disease)    Cardiac arrest ([place]) 11/29/2017   hx VF arrest/notes 12/20/2017   CHF (congestive heart failure) ([place])    Chronic systolic heart failure ([place]) 08/25/2019   Coronary artery disease    Coronary artery disease involving native coronary artery of native heart without angina pectoris 12/07/2017   Essential hypertension 05/18/2020   GERD (gastroesophageal reflux disease)    H/O ETOH abuse    /notes 12/20/2017   High cholesterol    Hypertension    Ischemic cardiomyopathy    a. 08/2019 s/p BSX D232 single lead AICD.   Mixed hyperlipidemia 05/18/2020   Seizure ([place]) 10/21/2019   Seizures ([place]) ~ 2016; ~ 2017   "I've had a couple; I was at work" (12/20/2017)   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, 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 ([place]) 12/30/2019   Syncope and collapse    "last few days" (12/20/2017)   Current Outpatient Medications  Medication Sig Dispense Refill   aspirin EC 81 MG tablet Take 1 tablet (81 mg total) by mouth daily. Swallow whole. 30 tablet 11   atorvastatin (LIPITOR) 80 MG tablet Take 1 tablet (80 mg total) by mouth in the morning. 30 tablet 11   empagliflozin (JARDIANCE) 10 MG TABS tablet Take 1 tablet (10 mg total) by mouth daily. 30 tablet 11   levETIRAcetam (KEPPRA) 500 MG tablet TAKE 1 TABLET BY MOUTH 2 TIMES DAILY 180 tablet 3   losartan (COZAAR) 25 MG tablet Take 1  tablet (25 mg total) by mouth daily. 90 tablet 3   metoprolol succinate (TOPROL XL) 25 MG 24 hr tablet Take 0.5 tablets (12.5 mg total) by mouth daily. 15 tablet 7   potassium chloride SA (KLOR-CON M) 20 MEQ tablet Take 2 tablets (40 mEq total) by mouth daily. 28 tablet 0   torsemide (DEMADEX) 20 MG tablet Take 4 tablets (80 mg total) by mouth daily. 120 tablet 11   No current facility-administered medications for this visit.   There were no vitals taken for this visit.  Wt Readings from Last 3  Encounters:  02/07/23 67.9 kg (149 lb 9.6 oz)  01/22/23 70.1 kg (154 lb 9.6 oz)  12/27/22 67.6 kg (149 lb)   PHYSICAL EXAM: General:  fatigued appearing in [place], looks much older than actual age. No respiratory difficulty HEENT: normal Neck: supple. JVD 8 cm w/ large V-waves. Carotids 2+ bilat; no bruits. No lymphadenopathy or thyromegaly appreciated. Cor: PMI nondisplaced. Regular rate & rhythm. 2/6 TR murmur  Lungs: clear Abdomen: soft, mild diffuse tender,ness +distended. +hepatosplenomegaly. No bruits or masses. Good bowel sounds. Extremities: no cyanosis, clubbing, rash, edema Neuro: alert & oriented x 3, cranial nerves grossly intact. moves all 4 extremities w/o difficulty. Affect pleasant.   Device interrogation (personally reviewed): HL index 3, activity level 2.0 hrs/day, average HR 94 bpm, no shocks.  ASSESSMENT & PLAN: 1. Acute on Chronic Biventricular HF, ICM  - Echo 11/30/17 25-30% RV normal.   - Echo 03/2018: EF 40-45%  RV normal.  - Echo 5/20 EF ~ 25%. Has ICD.  - Echo 12/2019 EF 30-35% RV normal. - Mixed Ischemic + probable ETOH mediated component  - Echo (10/23): biventricular failure getting worse from LVEF 30-35% Mod RV--> LVEF < 20% severely reduced RV.  - Has [employer] ICD.  - GDMT has been limited given compliance and hypotension. Will try to keep regimen simple. Do not recommend BID meds. - Volume status improved w/ switch from Lasix to torsemide. Wt down 7 lb. HL  score down from 27>>3. NYHA Class II-early III (previously IIIb). No VT on device interrogation  - Continue torsemide 80 mg daily. Continue KCL 80 daily. Check BMP and BNP today  - Continue Toprol XL 12.5 mg daily. - Continue Jardiance 10 mg daily.  - Continue losartan 25 mg daily.  - Now off digoxin d/t elevated level. - No spiro with h/o gynecomastia. Could consider Inspra but would avoid given h/o poor compliance  - Not a candidate for advanced therapies/transplant with ongoing ETOH/substance abuse and compliance (this includes home inotrope).     2. CAD - S/P CABG x 3 2018  - stable w/o CP  - Continue aspirin + atorvastatin.   3. Seizure Disorder - On Keppra.    4. ETOH Abuse - Has not had any ETOH in a few days. - Discussed cessation.   5. Abdominal pain - Lipase, Tbili, direct and indirect bili elevated on recent labs - He has been referred to GI but no appt yet. Will place new referral. W/ his h/o heavy ETOH use and continued abdominal distention, there is concern regarding liver disease/ cirrhosis. Will obtain RUE [place] and LFTs.   6. SDOH - Very difficult to manage with ongoing substance abuse and noncompliance.  - Have not been able to enroll in Paramedicine, the team has been unable to maintain contact with him.  - He has been followed by HF CSW [place]. Meds now bubble packed which seems to be helping with adherence. Also has transportation assistance. Working on Research scientist (physical sciences).  Follow up in 4 wks w/ APP   Lyda Jester, PA-C  03/01/23

## 2023-03-03 DIAGNOSIS — M545 Low back pain, unspecified: Secondary | ICD-10-CM | POA: Diagnosis not present

## 2023-03-03 DIAGNOSIS — I2510 Atherosclerotic heart disease of native coronary artery without angina pectoris: Secondary | ICD-10-CM | POA: Diagnosis not present

## 2023-03-03 DIAGNOSIS — E871 Hypo-osmolality and hyponatremia: Secondary | ICD-10-CM | POA: Diagnosis not present

## 2023-03-03 DIAGNOSIS — M549 Dorsalgia, unspecified: Secondary | ICD-10-CM | POA: Diagnosis not present

## 2023-03-05 ENCOUNTER — Other Ambulatory Visit (INDEPENDENT_AMBULATORY_CARE_PROVIDER_SITE_OTHER): Payer: Medicaid Other

## 2023-03-05 ENCOUNTER — Ambulatory Visit (INDEPENDENT_AMBULATORY_CARE_PROVIDER_SITE_OTHER): Payer: Medicaid Other | Admitting: Physician Assistant

## 2023-03-05 ENCOUNTER — Encounter: Payer: Self-pay | Admitting: Physician Assistant

## 2023-03-05 VITALS — BP 110/64 | HR 67 | Ht 69.0 in | Wt 152.1 lb

## 2023-03-05 DIAGNOSIS — R198 Other specified symptoms and signs involving the digestive system and abdomen: Secondary | ICD-10-CM

## 2023-03-05 DIAGNOSIS — R17 Unspecified jaundice: Secondary | ICD-10-CM

## 2023-03-05 LAB — PROTIME-INR
INR: 1.4 ratio — ABNORMAL HIGH (ref 0.8–1.0)
Prothrombin Time: 14.7 s — ABNORMAL HIGH (ref 9.6–13.1)

## 2023-03-05 LAB — HEPATIC FUNCTION PANEL
ALT: 15 U/L (ref 0–53)
AST: 23 U/L (ref 0–37)
Albumin: 3.8 g/dL (ref 3.5–5.2)
Alkaline Phosphatase: 117 U/L (ref 39–117)
Bilirubin, Direct: 0.5 mg/dL — ABNORMAL HIGH (ref 0.0–0.3)
Total Bilirubin: 1.5 mg/dL — ABNORMAL HIGH (ref 0.2–1.2)
Total Protein: 7.1 g/dL (ref 6.0–8.3)

## 2023-03-05 LAB — BRAIN NATRIURETIC PEPTIDE: Pro B Natriuretic peptide (BNP): 1320 pg/mL — ABNORMAL HIGH (ref 0.0–100.0)

## 2023-03-05 NOTE — Progress Notes (Signed)
Subjective:    Patient ID: [name], male    DOB: 01-18-1961, 62 y.o.   MRN: [id number]  HPI [name] is a 62 year old white male, new to GI, referred by cardiology/Brittaney Simmons/PA-C for evaluation of elevated T. bili and abdominal pain. Patient is a poor historian but currently says that he had been having some abdominal discomfort within the past couple of months.  He says at 1 point he had bruising across his abdomen which then subsided and has not recurred.  He is not currently having any ongoing or intermittent abdominal pain, denies any problems with changes in his bowel habits, no melena or hematochezia.  He cannot really tell me whether his abdominal girth has changed over the past couple of months.  He occasionally has some nausea but no vomiting, no dysphagia or odynophagia and says he is eating "okay". Patient has history of severe congestive heart failure with most recent echo in October 2023 showing an EF of less than 20% with severe left ventricular dysfunction with global hypokinesis and showing severe right ventricular enlargement and severe decrease in right ventricular function and mild increase in pulmonary pressures. He had an admission for acute congestive heart failure in October 2023.  He was most recently seen by cardiology a couple of weeks ago and Lasix was discontinued and he was a started on torsemide 80 mg daily for diuresis. BNP 02/07/2023 was 1658, BUN 12/creatinine 1.04 lipase 73 amylase 54 Repeat be met 03/01/2023 with creatinine 1.41. He also has history of coronary artery disease status post CABG x 4, V-fib arrest 2018, history of simple partial seizures, history of EtOH abuse and prolonged QT.  Patient says that he quit drinking alcohol and had just been drinking a couple of beers per day prior to stopping a month or so ago.  Then he said that he occasionally drinks a beer but is not drinking anything on a daily basis. He has not had any prior history of  liver disease to his knowledge. No prior GI evaluation.  Review of Systems Pertinent positive and negative review of systems were noted in the above HPI section.  All other review of systems was otherwise negative.    Outpatient Encounter Medications as of 03/05/2023  Medication Sig   aspirin EC 81 MG tablet Take 1 tablet (81 mg total) by mouth daily. Swallow whole.   atorvastatin (LIPITOR) 80 MG tablet Take 1 tablet (80 mg total) by mouth in the morning.   empagliflozin (JARDIANCE) 10 MG TABS tablet Take 1 tablet (10 mg total) by mouth daily.   furosemide (LASIX) 40 MG tablet Take 40 mg by mouth daily.   levETIRAcetam (KEPPRA) 500 MG tablet TAKE 1 TABLET BY MOUTH 2 TIMES DAILY   losartan (COZAAR) 25 MG tablet Take 1 tablet (25 mg total) by mouth daily.   metoprolol succinate (TOPROL XL) 25 MG 24 hr tablet Take 0.5 tablets (12.5 mg total) by mouth daily.   potassium chloride SA (KLOR-CON M) 20 MEQ tablet Take 2 tablets (40 mEq total) by mouth daily.   No facility-administered encounter medications on file as of 03/05/2023.   Allergies  Allergen Reactions   Aldactone [Spironolactone] Other (See Comments)    Gynecomastia    Patient Active Problem List   Diagnosis Date Noted   Acute on chronic systolic CHF (congestive heart failure) ([place]) 09/30/2022   Hypokalemia 09/30/2022   Coronary artery disease    Closed fracture of right upper limb 01/27/2021   Pneumonia due to  COVID-19 virus 08/14/2020   Hyponatremia 08/14/2020   Elevated AST (SGOT) 08/14/2020   Prolonged QT interval 08/14/2020   Cardiac arrest, cause unspecified ([place]) 07/06/2020   Left arm cellulitis 05/19/2020   Essential hypertension 05/18/2020   Mixed hyperlipidemia 05/18/2020   Alcohol abuse 05/18/2020   Cellulitis of left upper extremity 05/18/2020   Lactic acidosis 05/18/2020   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, 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 ([place]) 12/30/2019   Seizures ([place])  10/21/2019   ICD (implantable cardioverter-defibrillator) in place [id number]   Chronic systolic heart failure ([place]) 08/25/2019   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/12/2017   Coronary artery disease involving native coronary artery of native heart without angina pectoris 12/07/2017   Social History   Socioeconomic History   Marital status: Divorced    Spouse name: Not on file   Number of children: Not on file   Years of education: Not on file   Highest education level: Not on file  Occupational History   Not on file  Tobacco Use   Smoking status: Never   Smokeless tobacco: Never  Vaping Use   Vaping Use: Never used  Substance and Sexual Activity   Alcohol use: Yes    Alcohol/week: 14.0 standard drinks of alcohol    Types: 14 Cans of beer per week    Comment: patient endorses at least 1-2 beers daily (04/2020)   Drug use: No   Sexual activity: Not Currently  Other Topics Concern   Not on file  Social History Narrative   Lives at home with his brother who is crippled. He takes care of his brother.    Right handed    Social Determinants of Health   Financial Resource Strain: High Risk (02/15/2023)   Overall Financial Resource Strain (CARDIA)    Difficulty of Paying Living Expenses: Very hard  Food Insecurity: Food Insecurity Present (02/12/2023)   Hunger Vital Sign    Worried About Running Out of Food in the Last Year: Often true    Ran Out of Food in the Last Year: Often true  Transportation Needs: Unmet Transportation Needs (03/01/2023)   PRAPARE - Hydrologist (Medical): Yes    Lack of Transportation (Non-Medical): Yes  Physical Activity: Not on file  Stress: Not on file  Social Connections: Not on file  Intimate Partner Violence: Not on file    [name] family history includes Alzheimer's disease in his mother; Heart attack in an other family member; Lung disease in his father.      Objective:    Vitals:   03/05/23 1348  BP: 110/64  Pulse: 67   SpO2: 93%    Physical Exam Well-developed well-nourished  older WM  in no acute distress.  Height, Weight, 152 BMI 22.6  HEENT; nontraumatic normocephalic, EOMI, PE R LA, sclera anicteric.-Lips slightly bluish, earlobes reddish-bluish Oropharynx;not examined Neck; supple, +JVD below jaw Cardiovascular; regular rate and rhythm with S1-S2, no murmur rub or gallop Pulmonary; Clear bilaterally Abdomen; soft, protuberant, no definite fluid wave, liver is enlarged down 2 to 3 fingerbreadths below the right costal margin nontender, no palpable mass or hepatosplenomegaly, bowel sounds are active Rectal; not done today Skin; benign exam, no jaundice rash or appreciable lesions Extremities; recent lower extremity edema Neuro/Psych; alert and oriented x4, grossly nonfocal mood and affect appropriate        Assessment & Plan:   #63 62 year old white male referred due to elevated T. bili (2.0) on 03/01/2023-other LFTs  normal Also had elevated T. bili in December 2023 at 2.9, T. bili normal August 2023  Per cardiology notes patient had also been complaining of abdominal discomfort within the past couple of months. With diuresis over the past month or so this seems to have subsided though he is a poor historian. On exam he does not definitely have a fluid wave though does have an enlarged liver. I suspect he has congestive hepatomegaly secondary to severe CHF, could have cardiogenic ascites versus other underlying liver disease.  Abdominal ultrasound had been ordered by cardiology but had not been done as yet.  #2 coronary artery disease status post CABG x 4, V-fib arrest 2018 3.  Severe CHF with EF less than 20% status post ICD. 4.  History of prolonged QT 5.  Prior history of EtOH abuse 6.  History of simple partial seizures   Plan; abdominal ultrasound is scheduled for later this week, this will be helpful. Patient advised to continue to avoid all alcohol.  He did seem to have some  confusion about his medications Check hepatic panel to determine if elevated T. bili direct or indirect BNP Pro time/INR Further GI workup pending results of above.  Will plan to see him back in the office in 2 to 3 weeks. Patient will be established with Dr. Lyndel Safe.   [name] Genia Harold PA-C 03/05/2023   Cc: Gildardo Pounds, NP

## 2023-03-05 NOTE — Patient Instructions (Addendum)
_______________________________________________________  If your blood pressure at your visit was 140/90 or greater, please contact your primary care physician to follow up on this.  _______________________________________________________  If you are age 60 or older, your body mass index should be between 23-30. Your Body mass index is 22.46 kg/m. If this is out of the aforementioned range listed, please consider follow up with your Primary Care Provider.  If you are age 17 or younger, your body mass index should be between 19-25. Your Body mass index is 22.46 kg/m. If this is out of the aformentioned range listed, please consider follow up with your Primary Care Provider.   ________________________________________________________  The [facility] providers would like to encourage you to use [facility] to communicate with providers for non-urgent requests or questions.  Due to long hold times on the telephone, sending your provider a message by [facility] may be a faster and more efficient way to get a response.  Please allow 48 business hours for a response.  Please remember that this is for non-urgent requests.  _______________________________________________________  Your provider has requested that you go to the basement level for lab work before leaving today. Press "B" on the elevator. The lab is located at the first door on the left as you exit the elevator.  Thank you for entrusting me with your care and choosing [facility].  Amy Esterwood PA, C

## 2023-03-07 ENCOUNTER — Ambulatory Visit (HOSPITAL_COMMUNITY): Payer: Medicaid Other

## 2023-03-07 NOTE — Telephone Encounter (Signed)
done

## 2023-03-08 ENCOUNTER — Telehealth (HOSPITAL_COMMUNITY): Payer: Self-pay | Admitting: Licensed Clinical Social Worker

## 2023-03-08 NOTE — Telephone Encounter (Signed)
H&V Care Navigation CSW Progress Note  Clinical Social Worker [facility] have them send out bubble packs.  Bubble packs sent out yesterday.  Patient is participating in a Managed Medicaid Plan:  Yes  [place]: Food Insecurity Present (02/12/2023)  Housing: Low Risk  (10/02/2022)  Transportation Needs: Unmet Transportation Needs (03/01/2023)  Utilities: Not At Risk (10/02/2022)  Depression (PHQ2-9): Medium Risk (11/08/2022)  Financial Resource Strain: High Risk (02/15/2023)  Tobacco Use: Low Risk  (03/05/2023)    [name], LCSW Clinical Social Worker [facility] Desk#: [phone] Cell#: [phone]

## 2023-03-18 NOTE — Progress Notes (Signed)
Agree with assessment/plan.  Raj Jaydien Panepinto, MD [facility] [phone]  

## 2023-03-22 ENCOUNTER — Encounter: Payer: Self-pay | Admitting: Internal Medicine

## 2023-03-23 ENCOUNTER — Telehealth (HOSPITAL_COMMUNITY): Payer: Self-pay | Admitting: Licensed Clinical Social Worker

## 2023-03-23 NOTE — Telephone Encounter (Signed)
H&V Care Navigation CSW Progress Note  Clinical Social Worker received call from pt stating he lost his EBT card and needs help replacing.  CSW attempted to call EBT office with pt to reorder but they are closed for the holiday- CSW will follow up with pt Monday to assist.  Patient is participating in a Managed Medicaid Plan:  Yes  [place]: Food Insecurity Present (03/23/2023)  Housing: Low Risk  (10/02/2022)  Transportation Needs: Unmet Transportation Needs (03/01/2023)  Utilities: Not At Risk (10/02/2022)  Depression (PHQ2-9): Medium Risk (11/08/2022)  Financial Resource Strain: High Risk (02/15/2023)  Tobacco Use: Low Risk  (03/05/2023)    [name], [facility] Desk#: [phone] Cell#: [phone]

## 2023-03-26 ENCOUNTER — Telehealth (HOSPITAL_COMMUNITY): Payer: Self-pay | Admitting: Licensed Clinical Social Worker

## 2023-03-26 NOTE — Telephone Encounter (Signed)
H&V Care Navigation CSW Progress Note  Clinical Social Worker called [facility] Medicaid to help with setting up transportation for upcoming appts.  4/18:  Pick up from home: 10:15-10:45am Pick up from clinic: 12:30-1pm Ride ID: [id number]  4/22: Pick up from Home 12:45-1:15pm [place] up from clinic: 3-3:30pm Ride ID: [id number]  Patient is participating in a Managed Medicaid Plan:  Yes  [place]: Food Insecurity Present (03/23/2023)  Housing: Low Risk  (10/02/2022)  Transportation Needs: Unmet Transportation Needs (03/26/2023)  Utilities: Not At Risk (10/02/2022)  Depression (PHQ2-9): Medium Risk (11/08/2022)  Financial Resource Strain: High Risk (02/15/2023)  Tobacco Use: Low Risk  (03/05/2023)     [name], [facility] Desk#: [phone] Cell#: [phone]

## 2023-03-27 ENCOUNTER — Telehealth (HOSPITAL_COMMUNITY): Payer: Self-pay | Admitting: Licensed Clinical Social Worker

## 2023-03-27 NOTE — Telephone Encounter (Signed)
CSW called pt friend to follow up regarding calling EBT to request new card. Unable to reach- left VM requesting return call.  Will continue to attempt  [name], [place] Worker Advanced Heart Failure Clinic Desk#: [phone] Cell#: [phone]

## 2023-03-27 NOTE — Telephone Encounter (Unsigned)
H&V Care Navigation CSW Progress Note  Clinical Social Worker called pt help pt order new EBT card- order complete- will arrive in 5-10 business days  Patient is participating in a Managed Medicaid Plan:  Yes  [place]: Food Insecurity Present (03/23/2023)  Housing: Low Risk  (10/02/2022)  Transportation Needs: Unmet Transportation Needs (03/26/2023)  Utilities: Not At Risk (10/02/2022)  Depression (PHQ2-9): Medium Risk (11/08/2022)  Financial Resource Strain: High Risk (02/15/2023)  Tobacco Use: Low Risk  (03/05/2023)      [name], LCSW Clinical Social Worker [facility] Desk#: [phone] Cell#: [phone]

## 2023-03-28 ENCOUNTER — Telehealth (HOSPITAL_COMMUNITY): Payer: Self-pay | Admitting: Licensed Clinical Social Worker

## 2023-03-28 NOTE — Telephone Encounter (Signed)
H&V Care Navigation CSW Progress Note  Clinical Social Worker informed by patient that he now has a new phone- phone number updated in chart.  CSW called Medicaid and updated contact information for upcoming rides.  Patient is participating in a Managed Medicaid Plan:  Yes  [place]: Food Insecurity Present (03/23/2023)  Housing: Low Risk  (10/02/2022)  Transportation Needs: Unmet Transportation Needs (03/26/2023)  Utilities: Not At Risk (10/02/2022)  Depression (PHQ2-9): Medium Risk (11/08/2022)  Financial Resource Strain: High Risk (02/15/2023)  Tobacco Use: Low Risk  (03/05/2023)    Jose Ny, LCSW Clinical Social Worker [facility] Desk#: [phone] Cell#: [phone]

## 2023-04-06 ENCOUNTER — Other Ambulatory Visit (HOSPITAL_COMMUNITY): Payer: Self-pay | Admitting: Family Medicine

## 2023-04-06 ENCOUNTER — Other Ambulatory Visit: Payer: Medicaid Other | Admitting: Obstetrics and Gynecology

## 2023-04-06 NOTE — Patient Outreach (Signed)
Care Coordination  04/06/2023  [name] July 07, 1961 [id number]   Medicaid Managed Care   Unsuccessful Outreach Note  04/06/2023 Name: [name] MRN: [id number] DOB: 05-03-1961  Referred by: Claiborne Rigg, NP Reason for referral : High Risk Managed Medicaid (Unsuccessful telephone outreach)  An unsuccessful telephone outreach was attempted today. The patient was referred to the case management team for assistance with care management and care coordination.   Follow Up Plan: The patient has been provided with contact information for the care management team and has been advised to call with any health related questions or concerns.  The care management team will reach out to the patient again over the next 30 business  days.   Kathi Der RN, BSN [place]  [employer], production - Managed Medicaid High Risk [phone]

## 2023-04-06 NOTE — Patient Instructions (Signed)
Visit Information  [name]  - as a part of your Medicaid benefit, you are eligible for care management and care coordination services at no cost or copay. I was unable to reach you by phone today but would be happy to help you with your health related needs. Please feel free to call me at [phone].  A member of the Managed Medicaid care management team will reach out to you again over the next 30 business  days.   Kathi Der RN, BSN [place]  [employer], production - Managed Medicaid High Risk [phone]

## 2023-04-11 ENCOUNTER — Telehealth (HOSPITAL_COMMUNITY): Payer: Self-pay | Admitting: Licensed Clinical Social Worker

## 2023-04-11 ENCOUNTER — Telehealth (HOSPITAL_COMMUNITY): Payer: Self-pay

## 2023-04-11 NOTE — Telephone Encounter (Signed)
Called and left patient a voice message to confirm/remind patient of their appointment at the [facility] on 04/12/23.

## 2023-04-11 NOTE — Telephone Encounter (Signed)
H&V Care Navigation CSW Progress Note  Clinical Social Worker spoke with pt over the phone and reminded of appt tomorrow.  CSW informed pt pharmacist of appt and we will hold off on medication shipment until we confirm pt will be on same medications after tomorrows visit.  Patient is participating in a Managed Medicaid Plan:  Yes  SDOH Screenings   Food Insecurity: Food Insecurity Present (03/23/2023)  Housing: Low Risk  (10/02/2022)  Transportation Needs: Unmet Transportation Needs (03/26/2023)  Utilities: Not At Risk (10/02/2022)  Depression (PHQ2-9): Medium Risk (11/08/2022)  Financial Resource Strain: High Risk (02/15/2023)  Tobacco Use: Low Risk  (03/05/2023)     Burna Sis, LCSW Clinical Social Worker [facility] Desk#: [phone] Cell#: [phone]

## 2023-04-11 NOTE — Progress Notes (Signed)
[facility] Note  PCP: Bertram Denver, NP HF Cardiologist: Dr Gala Romney   Reason for Visit: Heart Failure   HPI: [name] is a 62 y.o. male with h/o ETOH abuse, VF Arrest 2018, CAD s/p CABG 12/07/17, and ICM with EF 25-30%.   Admitted 12/18 with VF arrest while chopping wood. Received CPR in the field. R/LHC with severe 3vD as below and cardiogenic shock requiring IABP and milrinone. Required milrinone with continued shock. Pt improved and IABP removed 12/10, but pt had recurrent VF arrest so IABP replaced. Stabilized and underwent CABG 12/07/17. Tolerated gradual wean of meds and extubation with no further events.  Echo 5/20 EF at 25%.    9/20 implantation of a [employer] single chamber ICD.   He had routine clinic f/u on 10/23/19 and while in exam room had a witnessed seizure  with loss of pulse. CPR started with quick ROSC. He did not require shock. Taken to the ED and had another seizure. Neurology consulted. Started on Keppra. ICD interrogated. No arrhythmias noted. Admitted to [facility] on 12/09/19 with recurrent seizures in setting of noncompliance with his Keppra.    Admitted 8/21 with COVID PNA.   Follow up 5/22 he was having facial and arm tingling, no other neuro symptoms. Ethanol level 145. Carvedilol increased and carotid u/s ordered showing bilateral ICA 1-39%.  Follow up 9/22, he was off all meds. Resumed meds and re-enrolled with Paramedicine. Follow up 10/22, he remained off all meds, NYHA II. Meds restarted, but lost to follow up.   Follow up 09/11/22, off all meds except Keppra, felt bad and volume overloaded.  Attempted outpatient diuresis and titration of GDMT but admitted in 10/23 d/t a/c CHF.   Seen for f/u 12/12/22. Had been in [place] ED for belly pain and swelling. Says they gave him an Advil and sent him home. No records to review. Continued to endorse abdominal pain at f/u. Lipase, Tbili, indirect  bilirubin and direct bilirubin elevated. He was referred to GI for workup. Also appeared volume overloaded and IV lasix arranged in infusion clinic.   Had f/u here in the [facility] 2/14 and was volume overloaded w/ NYHA IIIb symptoms. HL score 27. Continued abdominal swelling. Lasix was discontinued and he was started on torsemide 80 mg daily.   Today he returns for HF follow up.Overall feeling fine. Denies SOB/PND/Orthopnea. He walks everyday. He does not have car or a license. Appetite ok but he doesn't have much food. He is trying get his food stamps. Continues to drink alcohol. No fever or chills. He did not take his medications today. Benjaman Pott his medications about once a week. Of note his pill pack has both lasix and torsemide in the pack.    ROS: All systems negative except as listed in HPI, PMH and Problem List.  SH:  Social History   Socioeconomic History   Marital status: Divorced    Spouse name: Not on file   Number of children: Not on file   Years of education: Not on file   Highest education level: Not on file  Occupational History   Not on file  Tobacco Use   Smoking status: Never   Smokeless tobacco: Never  Vaping Use   Vaping Use: Never used  Substance and Sexual Activity   Alcohol use: Yes    Alcohol/week: 14.0 standard drinks of  alcohol    Types: 14 Cans of beer per week    Comment: patient endorses at least 1-2 beers daily (04/2020)   Drug use: No   Sexual activity: Not Currently  Other Topics Concern   Not on file  Social History Narrative   Lives at home with his brother who is crippled. He takes care of his brother.    Right handed    Social Determinants of Health   Financial Resource Strain: High Risk (02/15/2023)   Overall Financial Resource Strain (CARDIA)    Difficulty of Paying Living Expenses: Very hard  Food Insecurity: Food Insecurity Present (03/23/2023)   Hunger Vital Sign    Worried About Running Out of Food in the Last Year: Often true    Ran Out  of Food in the Last Year: Often true  Transportation Needs: Unmet Transportation Needs (03/26/2023)   PRAPARE - Administrator, Civil Service (Medical): Yes    Lack of Transportation (Non-Medical): Yes  Physical Activity: Not on file  Stress: Not on file  Social Connections: Not on file  Intimate Partner Violence: Not on file   FH:  Family History  Problem Relation Age of Onset   Alzheimer's disease Mother    Lung disease Father    Heart attack Other    Diabetes Neg Hx    Past Medical History:  Diagnosis Date   AICD (automatic cardioverter/defibrillator) present    Alcohol abuse 05/18/2020   Arthritis    "hands; maybe my toes" (12/20/2017)   CAD (coronary artery disease)    Cardiac arrest 11/29/2017   hx VF arrest/notes 12/20/2017   CHF (congestive heart failure)    Chronic systolic heart failure 08/25/2019   Coronary artery disease    Coronary artery disease involving native coronary artery of native heart without angina pectoris 12/07/2017   Essential hypertension 05/18/2020   GERD (gastroesophageal reflux disease)    H/O ETOH abuse    /notes 12/20/2017   High cholesterol    Hypertension    Ischemic cardiomyopathy    a. 08/2019 s/p BSX D232 single lead AICD.   Mixed hyperlipidemia 05/18/2020   Seizure 10/21/2019   Seizures ~ 2016; ~ 2017   "I've had a couple; I was at work" (12/20/2017)   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, 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 12/30/2019   Syncope and collapse    "last few days" (12/20/2017)   Current Outpatient Medications  Medication Sig Dispense Refill   aspirin EC 81 MG tablet Take 1 tablet (81 mg total) by mouth daily. Swallow whole. 30 tablet 11   atorvastatin (LIPITOR) 80 MG tablet Take 1 tablet (80 mg total) by mouth in the morning. 30 tablet 11   empagliflozin (JARDIANCE) 10 MG TABS tablet Take 1 tablet (10 mg total) by mouth daily. 30 tablet 11   losartan (COZAAR) 25 MG tablet Take 1  tablet (25 mg total) by mouth daily. 90 tablet 3   metoprolol succinate (TOPROL XL) 25 MG 24 hr tablet Take 0.5 tablets (12.5 mg total) by mouth daily. 15 tablet 7   Potassium Chloride Crys ER (KLOR-CON M20 PO) Patient takes 4 tablets by mouth daily.     torsemide (DEMADEX) 20 MG tablet Take 80 mg by mouth daily.     levETIRAcetam (KEPPRA) 500 MG tablet TAKE 1 TABLET BY MOUTH 2 TIMES DAILY (Patient not taking: Reported on 04/12/2023) 180 tablet 3   No current facility-administered medications for this encounter.   BP  108/66   Pulse 85   Wt 59.5 kg (131 lb 3.2 oz)   SpO2 100%   BMI 19.37 kg/m   Wt Readings from Last 3 Encounters:  04/12/23 59.5 kg (131 lb 3.2 oz)  03/05/23 69 kg (152 lb 2 oz)  03/01/23 64.6 kg (142 lb 6.4 oz)   PHYSICAL EXAM: General:  Well appearing. No resp difficulty HEENT: normal Neck: supple. no JVD. Carotids 2+ bilat; no bruits. No lymphadenopathy or thryomegaly appreciated. Cor: PMI nondisplaced. Regular rate & rhythm. No rubs, gallops or murmurs. Lungs: clear Abdomen: soft, nontender, nondistended. No hepatosplenomegaly. No bruits or masses. Good bowel sounds. Extremities: no cyanosis, clubbing, rash, edema Neuro: alert & orientedx3, cranial nerves grossly intact. moves all 4 extremities w/o difficulty. Affect pleasant  ASSESSMENT & PLAN: 1. Chronic Biventricular HF, ICM  - Echo 11/30/17 25-30% RV normal.   - Echo 03/2018: EF 40-45%  RV normal.  - Echo 5/20 EF ~ 25%. Has ICD.  - Echo 12/2019 EF 30-35% RV normal. - Mixed Ischemic + probable ETOH mediated component  - Echo (10/23): biventricular failure getting worse from LVEF 30-35% Mod RV--> LVEF < 20% severely reduced RV.  - Has [employer] ICD. Latitude  Score =0  -NYHA II. Volume status stable. Stop lasix. Continue torsemide 80 mg daily + 80 meq potassium daily.  - Continue Toprol XL 12.5 mg daily. - Continue Jardiance 10 mg daily.  - Continue losartan 25 mg daily.  - Now off digoxin d/t  elevated level. - No spiro with h/o gynecomastia. Could consider Inspra but would avoid given h/o poor compliance  - Not a candidate for advanced therapies/transplant with ongoing ETOH/substance abuse and compliance (this includes home inotrope).  - I verified lasix and torsemide in pill pack at [facility]. He was asked to bring his pill pack to his next visit.    2. CAD - S/P CABG x 3 2018  - stable w/o CP  - Continue aspirin + atorvastatin.   3. Seizure Disorder - On Keppra.    4. ETOH Abuse - Tries to limit alcohol.  - Discussed cessation.   5. Abdominal pain - Lipase, Tbili, direct and indirect bili elevated on recent labs - He had GI consultation.   6. SDOH Asked SW to see today. Needs help with transportation and food.   Follow with APP 6-8 weeks. Difficult to manage due to poor insight and ongoing medication issues.    Tonye Becket, NP-C  04/12/23

## 2023-04-12 ENCOUNTER — Encounter (HOSPITAL_COMMUNITY): Payer: Self-pay

## 2023-04-12 ENCOUNTER — Ambulatory Visit (INDEPENDENT_AMBULATORY_CARE_PROVIDER_SITE_OTHER): Payer: Medicaid Other

## 2023-04-12 ENCOUNTER — Ambulatory Visit (HOSPITAL_COMMUNITY)
Admission: RE | Admit: 2023-04-12 | Discharge: 2023-04-12 | Disposition: A | Discharge status: Home / Self Care | Payer: Medicaid Other | Source: Ambulatory Visit | Attending: Adult Health | Admitting: Adult Health

## 2023-04-12 VITALS — BP 108/66 | HR 85 | Wt 131.2 lb

## 2023-04-12 DIAGNOSIS — R109 Unspecified abdominal pain: Secondary | ICD-10-CM | POA: Insufficient documentation

## 2023-04-12 DIAGNOSIS — Z7984 Long term (current) use of oral hypoglycemic drugs: Secondary | ICD-10-CM | POA: Diagnosis not present

## 2023-04-12 DIAGNOSIS — Z8674 Personal history of sudden cardiac arrest: Secondary | ICD-10-CM | POA: Diagnosis not present

## 2023-04-12 DIAGNOSIS — F1010 Alcohol abuse, uncomplicated: Secondary | ICD-10-CM | POA: Diagnosis not present

## 2023-04-12 DIAGNOSIS — Z95810 Presence of automatic (implantable) cardiac defibrillator: Secondary | ICD-10-CM

## 2023-04-12 DIAGNOSIS — I5022 Chronic systolic (congestive) heart failure: Secondary | ICD-10-CM

## 2023-04-12 DIAGNOSIS — Z7982 Long term (current) use of aspirin: Secondary | ICD-10-CM | POA: Diagnosis not present

## 2023-04-12 DIAGNOSIS — I110 Hypertensive heart disease with heart failure: Secondary | ICD-10-CM | POA: Diagnosis present

## 2023-04-12 DIAGNOSIS — Z79899 Other long term (current) drug therapy: Secondary | ICD-10-CM | POA: Diagnosis not present

## 2023-04-12 DIAGNOSIS — I2510 Atherosclerotic heart disease of native coronary artery without angina pectoris: Secondary | ICD-10-CM

## 2023-04-12 DIAGNOSIS — R0989 Other specified symptoms and signs involving the circulatory and respiratory systems: Secondary | ICD-10-CM | POA: Diagnosis not present

## 2023-04-12 DIAGNOSIS — R1900 Intra-abdominal and pelvic swelling, mass and lump, unspecified site: Secondary | ICD-10-CM | POA: Insufficient documentation

## 2023-04-12 DIAGNOSIS — Z951 Presence of aortocoronary bypass graft: Secondary | ICD-10-CM | POA: Diagnosis not present

## 2023-04-12 DIAGNOSIS — I5082 Biventricular heart failure: Secondary | ICD-10-CM | POA: Insufficient documentation

## 2023-04-12 DIAGNOSIS — G40909 Epilepsy, unspecified, not intractable, without status epilepticus: Secondary | ICD-10-CM | POA: Insufficient documentation

## 2023-04-12 LAB — CUP PACEART REMOTE DEVICE CHECK
Battery Remaining Longevity: 156 mo
Battery Remaining Percentage: 100 %
Brady Statistic RV Percent Paced: 0 %
Date Time Interrogation Session: 20240418112400
HighPow Impedance: 62 Ohm
Implantable Lead Connection Status: 753985
Implantable Lead Implant Date: 20200904
Implantable Lead Location: 753860
Implantable Lead Model: 293
Implantable Lead Serial Number: 443717
Implantable Pulse Generator Implant Date: 20200904
Lead Channel Impedance Value: 446 Ohm
Lead Channel Setting Pacing Amplitude: 2.5 V
Lead Channel Setting Pacing Pulse Width: 0.4 ms
Lead Channel Setting Sensing Sensitivity: 0.5 mV
Pulse Gen Serial Number: 271403
Zone Setting Status: 755011

## 2023-04-12 MED ORDER — TORSEMIDE 20 MG PO TABS: 80.0000 mg | ORAL_TABLET | Freq: Every day | ORAL | 11 refills | Status: DC

## 2023-04-12 NOTE — Progress Notes (Signed)
H&V Care Navigation CSW Progress Note  Clinical Social Worker provided pt with food bag to help with current food insecurity.  Pt likely has food stamp card available at [facility] but has not been able to get a ride there yet- will try to get there tomorrow.  Patient is participating in a Managed Medicaid Plan:  Yes  SDOH Screenings   Food Insecurity: Food Insecurity Present (04/12/2023)  Housing: Low Risk  (10/02/2022)  Transportation Needs: Unmet Transportation Needs (03/26/2023)  Utilities: Not At Risk (10/02/2022)  Depression (PHQ2-9): Medium Risk (11/08/2022)  Financial Resource Strain: High Risk (02/15/2023)  Tobacco Use: Low Risk  (04/12/2023)    Burna Sis, LCSW Clinical Social Worker [facility] Desk#: [phone] Cell#: [phone]

## 2023-04-12 NOTE — Addendum Note (Signed)
Encounter addended by: Burna Sis, LCSW on: 04/12/2023 1:09 PM  Actions taken: Flowsheet accepted, Clinical Note Signed

## 2023-04-12 NOTE — Patient Instructions (Signed)
Medication Changes:  STOP taking LASIX.    Follow-Up in:   Your physician recommends that you schedule a follow-up appointment in: 6 weeks with APP.    Do the following things EVERYDAY: Weigh yourself in the morning before breakfast. Write it down and keep it in a log. Take your medicines as prescribed Eat low salt foods--Limit salt (sodium) to 2000 mg per day.  Stay as active as you can everyday Limit all fluids for the day to less than 2 liters    Need to Contact us:  If you have any questions or concerns before your next appointment please send [place] a message through [place] or call our office at [phone].    TO LEAVE A MESSAGE FOR THE NURSE SELECT OPTION 2, PLEASE LEAVE A MESSAGE INCLUDING: YOUR NAME DATE OF BIRTH CALL BACK NUMBER REASON FOR CALL**this is important as we prioritize the call backs  YOU WILL RECEIVE A CALL BACK THE SAME DAY AS LONG AS YOU CALL BEFORE 4:00 PM   At the [facility], you and your health needs are our priority. As part of our continuing mission to provide you with exceptional heart care, we have created designated Provider Care Teams. These Care Teams include your primary Cardiologist (physician) and Advanced Practice Providers (APPs- Physician Assistants and Nurse Practitioners) who all work together to provide you with the care you need, when you need it.   You may see any of the following providers on your designated Care Team at your next follow up: Dr Arvilla Meres Dr Marca Ancona Dr. Marcos Eke, NP Robbie Lis, [place] [facility] [place], [place] Brynda Peon, NP Karle Plumber, PharmD   Please be sure to bring in all your medications bottles to every appointment.    Thank you for choosing [facility]

## 2023-04-12 NOTE — Addendum Note (Signed)
Encounter addended by: Christiana Fuchs, RN on: 04/12/2023 12:09 PM  Actions taken: Order list changed

## 2023-04-16 ENCOUNTER — Ambulatory Visit (INDEPENDENT_AMBULATORY_CARE_PROVIDER_SITE_OTHER): Payer: Medicaid Other | Admitting: Physician Assistant

## 2023-04-16 ENCOUNTER — Other Ambulatory Visit (INDEPENDENT_AMBULATORY_CARE_PROVIDER_SITE_OTHER): Payer: Medicaid Other

## 2023-04-16 ENCOUNTER — Encounter: Payer: Self-pay | Admitting: Physician Assistant

## 2023-04-16 ENCOUNTER — Telehealth: Payer: Self-pay

## 2023-04-16 DIAGNOSIS — R17 Unspecified jaundice: Secondary | ICD-10-CM

## 2023-04-16 DIAGNOSIS — R160 Hepatomegaly, not elsewhere classified: Secondary | ICD-10-CM

## 2023-04-16 LAB — HEPATIC FUNCTION PANEL
ALT: 21 U/L (ref 0–53)
AST: 31 U/L (ref 0–37)
Albumin: 4.4 g/dL (ref 3.5–5.2)
Alkaline Phosphatase: 108 U/L (ref 39–117)
Bilirubin, Direct: 0.3 mg/dL (ref 0.0–0.3)
Total Bilirubin: 1 mg/dL (ref 0.2–1.2)
Total Protein: 7.9 g/dL (ref 6.0–8.3)

## 2023-04-16 NOTE — Patient Instructions (Signed)
_______________________________________________________  If your blood pressure at your visit was 140/90 or greater, please contact your primary care physician to follow up on this.  If you are age 60 or younger, your body mass index should be between 19-25. Your Body mass index is 20 kg/m. If this is out of the aformentioned range listed, please consider follow up with your Primary Care Provider.  ________________________________________________________  The [facility] providers would like to encourage you to use [facility] to communicate with providers for non-urgent requests or questions.  Due to long hold times on the telephone, sending your provider a message by [facility] may be a faster and more efficient way to get a response.  Please allow 48 business hours for a response.  Please remember that this is for non-urgent requests.  _______________________________________________________  Bonita Quin have been scheduled for an abdominal ultrasound at [facility] (1st floor of hospital) on 04-26-23 at 9am. Please arrive 15 minutes prior to your appointment for registration. Make certain not to have anything to eat or drink 6 hours prior to your appointment. Should you need to reschedule your appointment, please contact radiology at [phone]. This test typically takes about 30 minutes to perform.  Your provider has requested that you go to the basement level for lab work before leaving today. Press "B" on the elevator. The lab is located at the first door on the left as you exit the elevator.  Due to recent changes in healthcare laws, you may see the results of your imaging and laboratory studies on MyChart before your provider has had a chance to review them.  We understand that in some cases there may be results that are confusing or concerning to you. Not all laboratory results come back in the same time frame and the provider may be waiting for multiple results in order to interpret others.   Please give [place] 48 hours in order for your provider to thoroughly review all the results before contacting the office for clarification of your results.   Thank you for entrusting me with your care and choosing [facility].  Amy Esterwood, PA-C

## 2023-04-16 NOTE — Progress Notes (Signed)
Subjective:    Patient ID: [name], male    DOB: Nov 17, 1961, 62 y.o.   MRN: [id number]  HPI Darrion is a 62 year old white male who was initially seen in consultation by myself on 03/05/2023, and established with Dr. Chales Abrahams.  He was referred by cardiology for elevated total bilirubin and with Complaints of Abdominal Pain.  When he was seen a month ago he said that his abdominal discomfort had subsided and he felt this was more likely due to some bruising he had.  He is noted to be a poor historian. He has history of severe congestive heart failure with EF of less than 20%, severe left ventricular dysfunction and global hypokinesis and mild increase in pulmonary pressures.  He had an admission for acute congestive heart failure October 2023. He is followed closely by cardiology and had change in diuretics in March with discontinuation of Lasix and initiation of torsemide 80 mg daily for better diuresis.  Also with history of coronary artery disease status post CABG x 4, history of V-fib arrest 2018, seizure disorder and history of EtOH abuse as well as prolonged QT. He comes in today for scheduled follow-up unfortunately the ultrasound which was apparently ordered by cardiology had not been scheduled and he has not had this done as yet. Most recent labs from 03/05/2023 showed T. bili 1.5/direct bili 0.5 LFTs within normal limits, BNP 1320/INR 1.4/pro time 14.7. Reviewing labs in October 2023 T. bili 2.9 and direct bilirubin 2.2 and transaminases within normal limits.  He has no specific complaints today, had expressed concerns to cardiology last week about food insecurity and is trying to get food stamps, he also does not have any transportation. No current complaints of abdominal discomfort, no peripheral edema, denies any chest pain or shortness of breath.  When asked he is vague about the amount of alcohol that he drinks but says that he still drinks beer, though not every day  Review of  Systems Pertinent positive and negative review of systems were noted in the above HPI section.  All other review of systems was otherwise negative.   Outpatient Encounter Medications as of 04/16/2023  Medication Sig   aspirin EC 81 MG tablet Take 1 tablet (81 mg total) by mouth daily. Swallow whole.   atorvastatin (LIPITOR) 80 MG tablet Take 1 tablet (80 mg total) by mouth in the morning.   empagliflozin (JARDIANCE) 10 MG TABS tablet Take 1 tablet (10 mg total) by mouth daily.   levETIRAcetam (KEPPRA) 500 MG tablet TAKE 1 TABLET BY MOUTH 2 TIMES DAILY   losartan (COZAAR) 25 MG tablet Take 1 tablet (25 mg total) by mouth daily.   metoprolol succinate (TOPROL XL) 25 MG 24 hr tablet Take 0.5 tablets (12.5 mg total) by mouth daily.   Potassium Chloride Crys ER (KLOR-CON M20 PO) Patient takes 4 tablets by mouth daily.   torsemide (DEMADEX) 20 MG tablet Take 4 tablets (80 mg total) by mouth daily.   No facility-administered encounter medications on file as of 04/16/2023.   Allergies  Allergen Reactions   Aldactone [Spironolactone] Other (See Comments)    Gynecomastia    Patient Active Problem List   Diagnosis Date Noted   Acute on chronic systolic CHF (congestive heart failure) 09/30/2022   Hypokalemia 09/30/2022   Coronary artery disease    Closed fracture of right upper limb 01/27/2021   Pneumonia due to COVID-19 virus 08/14/2020   Hyponatremia 08/14/2020   Elevated AST (SGOT) 08/14/2020   Prolonged  QT interval 08/14/2020   Cardiac arrest, cause unspecified 07/06/2020   Left arm cellulitis 05/19/2020   Essential hypertension 05/18/2020   Mixed hyperlipidemia 05/18/2020   Alcohol abuse 05/18/2020   Cellulitis of left upper extremity 05/18/2020   Lactic acidosis 05/18/2020   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, 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 12/30/2019   Seizures 10/21/2019   ICD (implantable cardioverter-defibrillator) in place 08/29/2019   Chronic  systolic heart failure 08/25/2019   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/12/2017   Coronary artery disease involving native coronary artery of native heart without angina pectoris 12/07/2017   Social History   Socioeconomic History   Marital status: Divorced    Spouse name: Not on file   Number of children: Not on file   Years of education: Not on file   Highest education level: Not on file  Occupational History   Not on file  Tobacco Use   Smoking status: Never   Smokeless tobacco: Never  Vaping Use   Vaping Use: Never used  Substance and Sexual Activity   Alcohol use: Yes    Alcohol/week: 14.0 standard drinks of alcohol    Types: 14 Cans of beer per week    Comment: patient endorses at least 1-2 beers daily (04/2020); 03/2023   Drug use: No   Sexual activity: Not Currently  Other Topics Concern   Not on file  Social History Narrative   Lives at home with his brother who is crippled. He takes care of his brother.    Right handed    Social Determinants of Health   Financial Resource Strain: High Risk (02/15/2023)   Overall Financial Resource Strain (CARDIA)    Difficulty of Paying Living Expenses: Very hard  Food Insecurity: Food Insecurity Present (04/12/2023)   Hunger Vital Sign    Worried About Running Out of Food in the Last Year: Often true    Ran Out of Food in the Last Year: Often true  Transportation Needs: Unmet Transportation Needs (03/26/2023)   PRAPARE - Administrator, Civil Service (Medical): Yes    Lack of Transportation (Non-Medical): Yes  Physical Activity: Not on file  Stress: Not on file  Social Connections: Not on file  Intimate Partner Violence: Not on file    Mr. Danh's family history includes Alzheimer's disease in his mother; Heart attack in an other family member; Lung disease in his father.      Objective:    Vitals:   04/16/23 1350  BP: 100/70  Pulse: 80    Physical Exam Well-developed well-nourished older white male in no acute distress.   Height, Weight, 135 BMI 20.0  HEENT; nontraumatic normocephalic, EOMI, PE R LA, sclera anicteric. Oropharynx; not examined today Neck; supple, no JVD Cardiovascular; regular rate and rhythm with S1-S2, no murmur rub or gallop Pulmonary; Clear bilaterally Abdomen; soft, nontender, nondistended, no palpable mass or splenomegaly bowel sounds are active, palpable just at the right costal margin, no appreciable ascites or fluid wave Rectal; not done today Skin; benign exam, no jaundice rash or appreciable lesions Extremities; no clubbing cyanosis or edema skin warm and dry Neuro/Psych; alert and oriented x4, grossly nonfocal mood and affect appropriate        Assessment & Plan:   #49 63 year old white male with elevated total bilirubin, with prior fractionation in October 2023 that was primarily indirect.  Transaminases and alk phos have been normal. 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  He has diuresed since that last appointment, no significant palpable hepatomegaly today  He also has history of EtOH use disorder.  Need to rule out cirrhosis, fatty liver.  #2 coronary artery disease status post CABG x 4, V-fib arrest 2018, status post ICD #3 severe congestive heart failure with EF less than 20% 4.  Seizure disorder 5.  Hypertension 7.  Adult onset diabetes mellitus 8.  Prolonged QT  Plan; patient was scheduled for upper abdominal ultrasound today, we also reached out to his social worker to see if they could assist with transportation for the ultrasound. No repeat labs today Further recommendations and follow-up based on results of ultrasound.  Hend Mccarrell Oswald Hillock PA-C 04/16/2023   Cc: Claiborne Rigg, NP

## 2023-04-16 NOTE — Telephone Encounter (Signed)
Spoke with [facility] at [facility] to set up transportation for abdominal ultrasound scheduled on 04-26-23 at 9am.  The patient will be picked up at his home address on 04-26-23 between 745 and 815am.  He will be taken to [facility] (radiology dept) for abdominal ultrasound.  Pick up time was set for 10:30am.  ID #[id number]

## 2023-04-18 ENCOUNTER — Other Ambulatory Visit: Payer: Medicaid Other | Admitting: Obstetrics and Gynecology

## 2023-04-18 NOTE — Progress Notes (Signed)
Agree with assessment/plan.  Raj Penina Reisner, MD [facility] [phone]  

## 2023-04-18 NOTE — Patient Outreach (Signed)
  Medicaid Managed Care   Unsuccessful Attempt Note   04/18/2023 Name: [name] MRN: [id number] DOB: Sep 10, 1961  Referred by: Claiborne Rigg, NP Reason for referral : No chief complaint on file.  A second unsuccessful telephone outreach was attempted today. The patient was referred to the case management team for assistance with care management and care coordination.    Follow Up Plan: The Managed Medicaid care management team will reach out to the patient again over the next 30 business  days. and The  Patient has been provided with contact information for the Managed Medicaid care management team and has been advised to call with any health related questions or concerns.   Kathi Der RN, BSN [place]  [facility] [employer] - Managed Medicaid High Risk [phone]

## 2023-04-18 NOTE — Patient Instructions (Signed)
Visit Information  [name]  - as a part of your Medicaid benefit, you are eligible for care management and care coordination services at no cost or copay. I was unable to reach you by phone today but would be happy to help you with your health related needs. Please feel free to call me at [phone].  A member of the Managed Medicaid care management team will reach out to you again over the next 30 business  days.   Yakira Duquette RN, BSN [place]  [facility] Care Management Coordinator - Managed Medicaid High Risk [phone]   

## 2023-04-25 ENCOUNTER — Telehealth (HOSPITAL_COMMUNITY): Payer: Self-pay | Admitting: Licensed Clinical Social Worker

## 2023-04-25 NOTE — Telephone Encounter (Signed)
CSW called pt and reminded of ultrasound scheduled for tomorrow.  Informed pt of ride details.  Pt reports no barriers to attending.  Burna Sis, LCSW Clinical Social Worker [facility] Desk#: [phone] Cell#: [phone]

## 2023-04-26 ENCOUNTER — Ambulatory Visit (HOSPITAL_COMMUNITY): Admission: RE | Admit: 2023-04-26 | Payer: Medicaid Other | Source: Ambulatory Visit

## 2023-05-01 ENCOUNTER — Other Ambulatory Visit: Payer: Medicaid Other | Admitting: Obstetrics and Gynecology

## 2023-05-01 NOTE — Patient Instructions (Signed)
Visit Information  [name]  - as a part of your Medicaid benefit, you are eligible for care management and care coordination services at no cost or copay. I was unable to reach you by phone today but would be happy to help you with your health related needs. Please feel free to call me at [phone].  Kathi Der RN, BSN [place]  [facility] - Managed Medicaid High Risk [phone]

## 2023-05-01 NOTE — Patient Outreach (Cosign Needed)
Care Coordination  05/01/2023  [name] 08/04/1961 [id number]  Medicaid Managed Care   Unsuccessful Outreach Note  05/01/2023 Name: [name] MRN: [id number] DOB: 11/14/1961  Referred by: Claiborne Rigg, NP Reason for referral : High Risk Managed Medicaid (Unsuccessful telephone outreach)  Third unsuccessful telephone outreach was attempted today. The patient was referred to the case management team for assistance with care management and care coordination. The patient's primary care provider has been notified of our unsuccessful attempts to make or maintain contact with the patient. The care management team is pleased to engage with this patient at any time in the future should he/she be interested in assistance from the care management team.   Follow Up Plan: We have been unable to make contact with the patient.  The care management team is available to follow up with the patient after provider conversation with the patient regarding recommendation for care management engagement and subsequent re-referral to the care management team.   Kathi Der RN, BSN [place]  [facility] Care Management Coordinator - Managed [place] High Risk [phone]

## 2023-05-04 ENCOUNTER — Telehealth (HOSPITAL_COMMUNITY): Payer: Self-pay | Admitting: Licensed Clinical Social Worker

## 2023-05-04 NOTE — Telephone Encounter (Signed)
H&V Care Navigation CSW Progress Note  Clinical Social Worker called [facility] Medicaid transport and made ride for upcoming appt.  Pick up from home: 8:45-9:15am Pick up after appt 11-11:30am Ride ID: [id number]  Patient is participating in a Managed Medicaid Plan:  Yes  SDOH Screenings   Food Insecurity: Food Insecurity Present (04/12/2023)  Housing: Low Risk  (10/02/2022)  Transportation Needs: Unmet Transportation Needs (03/26/2023)  Utilities: Not At Risk (10/02/2022)  Depression (PHQ2-9): Medium Risk (11/08/2022)  Financial Resource Strain: High Risk (02/15/2023)  Tobacco Use: Low Risk  (04/16/2023)    Burna Sis, LCSW Clinical Social Worker [facility] Desk#: [phone] Cell#: [phone]

## 2023-05-14 ENCOUNTER — Telehealth (HOSPITAL_COMMUNITY): Payer: Self-pay | Admitting: Licensed Clinical Social Worker

## 2023-05-14 ENCOUNTER — Other Ambulatory Visit (HOSPITAL_COMMUNITY): Payer: Self-pay

## 2023-05-14 NOTE — Progress Notes (Signed)
Remote ICD transmission.   

## 2023-05-14 NOTE — Telephone Encounter (Signed)
CSW called [facility] and requesting meds be mailed out to patient- they will put in blister packs and mail out tomorrow  Burna Sis, LCSW Clinical Social Worker [facility] Desk#: [phone] Cell#: [phone]

## 2023-05-23 ENCOUNTER — Telehealth (HOSPITAL_COMMUNITY): Payer: Self-pay | Admitting: Licensed Clinical Social Worker

## 2023-05-23 NOTE — Telephone Encounter (Signed)
Attempted to call pt to remind of his appt tomorrow.  Unable to reach- phone does not appear to be in service.  Called his neighbor x2 and left VM with pick up information included  Burna Sis, LCSW Clinical Social Worker [facility] Desk#: [phone] Cell#: [phone]

## 2023-05-24 ENCOUNTER — Encounter (HOSPITAL_COMMUNITY): Payer: Medicaid Other

## 2023-05-28 ENCOUNTER — Telehealth (HOSPITAL_COMMUNITY): Payer: Self-pay | Admitting: Licensed Clinical Social Worker

## 2023-05-28 NOTE — Telephone Encounter (Signed)
H&V Care Navigation CSW Progress Note  Clinical Social Worker trying to contact pt regarding upcoming oral surgeon consult on June 5th at 1:30pm.  Left message at [facility] for pt to call CSW back.  Pt phone and neighbors phone not currently working.  CSW set up ride through [facility] transport- pick up from home 12pm, pick up from appt 2:30pm   Patient is participating in a Managed Medicaid Plan:  Yes  SDOH Screenings   Food Insecurity: Food Insecurity Present (04/12/2023)  Housing: Low Risk  (10/02/2022)  Transportation Needs: Unmet Transportation Needs (05/04/2023)  Utilities: Not At Risk (10/02/2022)  Depression (PHQ2-9): Medium Risk (11/08/2022)  Financial Resource Strain: High Risk (02/15/2023)  Tobacco Use: Low Risk  (04/16/2023)   Burna Sis, LCSW Clinical Social Worker [facility] Desk#: [phone] Cell#: [phone]

## 2023-05-29 ENCOUNTER — Telehealth (HOSPITAL_COMMUNITY): Payer: Self-pay | Admitting: Licensed Clinical Social Worker

## 2023-05-29 NOTE — Telephone Encounter (Signed)
H&V Care Navigation CSW Progress Note  Clinical Social Worker  contacted [name]  to leave message for patient to contact CSW regarding dental appointment tomorrow.  Patient is participating in a Managed Medicaid Plan:  No  CSW spoke with [name] and states that they did give patient the message yesterday although patient did not call HF CSW. Message left this morning and [name] stated they will have patient call HF CSW when he comes in to the store. CSW will await call to rely information regarding pick up time for dental appointment tomorrow. [name], LCSW, CCSW-MCS [phone]   SDOH Screenings   Food Insecurity: Food Insecurity Present (04/12/2023)  Housing: Low Risk  (10/02/2022)  Transportation Needs: Unmet Transportation Needs (05/04/2023)  Utilities: Not At Risk (10/02/2022)  Depression (PHQ2-9): Medium Risk (11/08/2022)  Financial Resource Strain: High Risk (02/15/2023)  Tobacco Use: Low Risk  (04/16/2023)

## 2023-05-30 ENCOUNTER — Telehealth (HOSPITAL_COMMUNITY): Payer: Self-pay | Admitting: Licensed Clinical Social Worker

## 2023-06-01 NOTE — Telephone Encounter (Signed)
CSW attempted to contact pt again regarding dental appt today but no response  CSW will await pt reaching out to try and get him back on track with appts  Burna Sis, LCSW Clinical Social Worker [facility] Desk#: [phone] Cell#: [phone]

## 2023-09-05 ENCOUNTER — Telehealth (HOSPITAL_COMMUNITY): Payer: Self-pay | Admitting: Licensed Clinical Social Worker

## 2023-09-05 NOTE — Telephone Encounter (Signed)
Entered in error

## 2023-09-05 NOTE — Telephone Encounter (Signed)
H&V Care Navigation CSW Progress Note  Clinical Social Worker received call from pt requesting help getting reestablished with clinic.  Assisted in getting new appt with clinic.  CSW helped to set up Medicaid transport  Pick up from home: 10:45-11:15am Pick up from appt: 1pm Ride ID: [id number]   CSW attempted to call pt to inform of details but phone wouldn't allow incoming call at this time- will continue to attempt.  Patient is participating in a Managed Medicaid Plan:  Yes  SDOH Screenings   Food Insecurity: Food Insecurity Present (04/12/2023)  Housing: Low Risk  (10/02/2022)  Transportation Needs: Unmet Transportation Needs (09/05/2023)  Utilities: Not At Risk (10/02/2022)  Depression (PHQ2-9): Medium Risk (11/08/2022)  Financial Resource Strain: High Risk (02/15/2023)  Tobacco Use: Low Risk  (04/16/2023)   Burna Sis, LCSW Clinical Social Worker [facility] Desk#: [phone] Cell#: [phone]

## 2023-09-11 ENCOUNTER — Telehealth (HOSPITAL_COMMUNITY): Payer: Self-pay | Admitting: Licensed Clinical Social Worker

## 2023-09-11 NOTE — Telephone Encounter (Signed)
CSW attempted to call pt again to remind of appt Thursday and of ride details.  Patient phone not accepting calls- sent text message with appt details.  Burna Sis, LCSW Clinical Social Worker [facility] Desk#: [phone] Cell#: [phone]

## 2023-09-12 ENCOUNTER — Telehealth (HOSPITAL_COMMUNITY): Payer: Self-pay

## 2023-09-12 NOTE — Telephone Encounter (Signed)
Called and was unable to leave a voice message because pt's number was invalid to confirm/remind patient of their appointment at the [facility] on 09/13/23.

## 2023-09-13 ENCOUNTER — Ambulatory Visit (HOSPITAL_COMMUNITY)
Admission: RE | Admit: 2023-09-13 | Discharge: 2023-09-13 | Disposition: A | Discharge status: Home / Self Care | Payer: Medicaid Other | Source: Ambulatory Visit | Attending: Cardiology | Admitting: Cardiology

## 2023-09-13 ENCOUNTER — Ambulatory Visit (INDEPENDENT_AMBULATORY_CARE_PROVIDER_SITE_OTHER): Payer: Medicaid Other

## 2023-09-13 ENCOUNTER — Encounter (HOSPITAL_COMMUNITY): Payer: Self-pay

## 2023-09-13 VITALS — BP 120/68 | HR 80 | Wt 143.6 lb

## 2023-09-13 DIAGNOSIS — Z79899 Other long term (current) drug therapy: Secondary | ICD-10-CM | POA: Insufficient documentation

## 2023-09-13 DIAGNOSIS — Z5982 Transportation insecurity: Secondary | ICD-10-CM | POA: Insufficient documentation

## 2023-09-13 DIAGNOSIS — Z8674 Personal history of sudden cardiac arrest: Secondary | ICD-10-CM | POA: Diagnosis not present

## 2023-09-13 DIAGNOSIS — I5082 Biventricular heart failure: Secondary | ICD-10-CM | POA: Insufficient documentation

## 2023-09-13 DIAGNOSIS — Z8616 Personal history of COVID-19: Secondary | ICD-10-CM | POA: Insufficient documentation

## 2023-09-13 DIAGNOSIS — I5022 Chronic systolic (congestive) heart failure: Secondary | ICD-10-CM | POA: Diagnosis not present

## 2023-09-13 DIAGNOSIS — I5023 Acute on chronic systolic (congestive) heart failure: Secondary | ICD-10-CM | POA: Insufficient documentation

## 2023-09-13 DIAGNOSIS — I110 Hypertensive heart disease with heart failure: Secondary | ICD-10-CM | POA: Diagnosis not present

## 2023-09-13 DIAGNOSIS — G40909 Epilepsy, unspecified, not intractable, without status epilepticus: Secondary | ICD-10-CM | POA: Insufficient documentation

## 2023-09-13 DIAGNOSIS — I255 Ischemic cardiomyopathy: Secondary | ICD-10-CM | POA: Diagnosis not present

## 2023-09-13 DIAGNOSIS — Z7984 Long term (current) use of oral hypoglycemic drugs: Secondary | ICD-10-CM | POA: Diagnosis not present

## 2023-09-13 DIAGNOSIS — Z951 Presence of aortocoronary bypass graft: Secondary | ICD-10-CM | POA: Insufficient documentation

## 2023-09-13 DIAGNOSIS — F1010 Alcohol abuse, uncomplicated: Secondary | ICD-10-CM | POA: Insufficient documentation

## 2023-09-13 DIAGNOSIS — I2510 Atherosclerotic heart disease of native coronary artery without angina pectoris: Secondary | ICD-10-CM | POA: Diagnosis not present

## 2023-09-13 DIAGNOSIS — Z91148 Patient's other noncompliance with medication regimen for other reason: Secondary | ICD-10-CM | POA: Insufficient documentation

## 2023-09-13 LAB — CUP PACEART REMOTE DEVICE CHECK
Battery Remaining Longevity: 150 mo
Battery Remaining Percentage: 100 %
Brady Statistic RV Percent Paced: 0 %
Date Time Interrogation Session: 20240919121400
HighPow Impedance: 60 Ohm
Implantable Lead Connection Status: 753985
Implantable Lead Implant Date: 20200904
Implantable Lead Location: 753860
Implantable Lead Model: 293
Implantable Lead Serial Number: 443717
Implantable Pulse Generator Implant Date: 20200904
Lead Channel Impedance Value: 427 Ohm
Lead Channel Setting Pacing Amplitude: 2.5 V
Lead Channel Setting Pacing Pulse Width: 0.4 ms
Lead Channel Setting Sensing Sensitivity: 0.5 mV
Pulse Gen Serial Number: 271403
Zone Setting Status: 755011

## 2023-09-13 LAB — BASIC METABOLIC PANEL
Anion gap: 11 (ref 5–15)
BUN: 18 mg/dL (ref 6–20)
CO2: 29 mmol/L (ref 22–32)
Calcium: 9.4 mg/dL (ref 8.9–10.3)
Chloride: 96 mmol/L — ABNORMAL LOW (ref 98–111)
Creatinine, Ser: 1.11 mg/dL (ref 0.61–1.24)
GFR, Estimated: >60 mL/min (ref >60)
Glucose, Bld: 102 mg/dL — ABNORMAL HIGH (ref 70–99)
Potassium: 3.5 mmol/L (ref 3.5–5.1)
Sodium: 136 mmol/L (ref 135–145)

## 2023-09-13 LAB — BRAIN NATRIURETIC PEPTIDE: B Natriuretic Peptide: 304.2 pg/mL — ABNORMAL HIGH (ref 0.0–100.0)

## 2023-09-13 NOTE — Patient Instructions (Addendum)
Labs done today. We will contact you only if your labs are abnormal.  RESTART All Heart Failure Medications  No other medication changes were made. Please continue all current medications as prescribed.  Your physician recommends that you schedule a follow-up appointment in: 4 weeks with our NP/PA Clinic  If you have any questions or concerns before your next appointment please send [place] a message through [place] or call our office at [phone].    TO LEAVE A MESSAGE FOR THE NURSE SELECT OPTION 2, PLEASE LEAVE A MESSAGE INCLUDING: YOUR NAME DATE OF BIRTH CALL BACK NUMBER REASON FOR CALL**this is important as we prioritize the call backs  YOU WILL RECEIVE A CALL BACK THE SAME DAY AS LONG AS YOU CALL BEFORE 4:00 PM   Do the following things EVERYDAY: Weigh yourself in the morning before breakfast. Write it down and keep it in a log. Take your medicines as prescribed Eat low salt foods--Limit salt (sodium) to 2000 mg per day.  Stay as active as you can everyday Limit all fluids for the day to less than 2 liters   At the [facility], you and your health needs are our priority. As part of our continuing mission to provide you with exceptional heart care, we have created designated Provider Care Teams. These Care Teams include your primary Cardiologist (physician) and Advanced Practice Providers (APPs- Physician Assistants and Nurse Practitioners) who all work together to provide you with the care you need, when you need it.   You may see any of the following providers on your designated Care Team at your next follow up: Dr Arvilla Meres Dr Marca Ancona Dr. Marcos Eke, NP Robbie Lis, [place] [facility] [place], [place] Brynda Peon, NP Karle Plumber, PharmD   Please be sure to bring in all your medications bottles to every appointment.    Thank you for choosing [facility]

## 2023-09-13 NOTE — Progress Notes (Signed)
[facility] Note  PCP: Bertram Denver, NP HF Cardiologist: Dr Gala Romney   Reason for Visit: Heart Failure   HPI: [place] Bayles is a 62 y.o. male with h/o ETOH abuse, VF Arrest 2018, CAD s/p CABG 12/07/17, and ICM with EF 25-30%.   Admitted 12/18 with VF arrest while chopping wood. Received CPR in the field. R/LHC with severe 3vD as below and cardiogenic shock requiring IABP and milrinone. Required milrinone with continued shock. Pt improved and IABP removed 12/10, but pt had recurrent VF arrest so IABP replaced. Stabilized and underwent CABG 12/07/17. Tolerated gradual wean of meds and extubation with no further events.  Echo 5/20 EF at 25%.    9/20 implantation of a [employer] single chamber ICD.   He had routine clinic f/u on 10/23/19 and while in exam room had a witnessed seizure  with loss of pulse. CPR started with quick ROSC. He did not require shock. Taken to the ED and had another seizure. Neurology consulted. Started on Keppra. ICD interrogated. No arrhythmias noted. Admitted to [facility] on 12/09/19 with recurrent seizures in setting of noncompliance with his Keppra.    Admitted 8/21 with COVID PNA.   Follow up 5/22 he was having facial and arm tingling, no other neuro symptoms. Ethanol level 145. Carvedilol increased and carotid u/s ordered showing bilateral ICA 1-39%.  Follow up 9/22, he was off all meds. Resumed meds and re-enrolled with Paramedicine. Follow up 10/22, he remained off all meds, NYHA II. Meds restarted, but lost to follow up.   Follow up 09/11/22, off all meds except Keppra, felt bad and volume overloaded.  Attempted outpatient diuresis and titration of GDMT but admitted in 10/23 d/t a/c CHF.   Seen for f/u 12/12/22. Had been in [place] ED for belly pain and swelling. Says they gave him an Advil and sent him home. No records to review. Continued to endorse abdominal pain at f/u. Lipase, Tbili, indirect  bilirubin and direct bilirubin elevated. He was referred to GI for workup. Also appeared volume overloaded and IV lasix arranged in infusion clinic.   Had f/u here in the [facility] 2/14 and was volume overloaded w/ NYHA IIIb symptoms. HL score 27. Continued abdominal swelling. Lasix was discontinued and he was started on torsemide 80 mg daily.   He presents today for f/u. Doing fairly well. Does not drive. Walks most places. C/w NYHA Class II-early III symptoms.  Denies resting dyspnea. Still drinking beer but not on a daily bases. No recent seizures per his report. He admits to poor compliance w/ meds and missed taking them the last 3 days. Has pill packs. Wt is up 12 lb since last visit in April. HL score is elevated at 24. No VT therapies on device interrogation. BP 120/68. EKG NSR w/ PVCs.   ROS: All systems negative except as listed in HPI, PMH and Problem List.  SH:  Social History   Socioeconomic History   Marital status: Divorced    Spouse name: Not on file   Number of children: Not on file   Years of education: Not on file   Highest education level: Not on file  Occupational History   Not on file  Tobacco Use   Smoking status: Never   Smokeless tobacco: Never  Vaping Use   Vaping status: Never Used  Substance and Sexual Activity  Alcohol use: Yes    Alcohol/week: 14.0 standard drinks of alcohol    Types: 14 Cans of beer per week    Comment: patient endorses at least 1-2 beers daily (04/2020); 03/2023   Drug use: No   Sexual activity: Not Currently  Other Topics Concern   Not on file  Social History Narrative   Lives at home with his brother who is crippled. He takes care of his brother.    Right handed    Social Determinants of Health   Financial Resource Strain: High Risk (02/15/2023)   Overall Financial Resource Strain (CARDIA)    Difficulty of Paying Living Expenses: Very hard  Food Insecurity: Food Insecurity Present (04/12/2023)   Hunger Vital Sign    Worried About  Running Out of Food in the Last Year: Often true    Ran Out of Food in the Last Year: Often true  Transportation Needs: Unmet Transportation Needs (09/05/2023)   PRAPARE - Administrator, Civil Service (Medical): Yes    Lack of Transportation (Non-Medical): Yes  Physical Activity: Not on file  Stress: Not on file  Social Connections: Not on file  Intimate Partner Violence: Not on file   FH:  Family History  Problem Relation Age of Onset   Alzheimer's disease Mother    Lung disease Father    Heart attack Other    Diabetes Neg Hx    Colon cancer Neg Hx    Esophageal cancer Neg Hx    Pancreatic cancer Neg Hx    Stomach cancer Neg Hx    Past Medical History:  Diagnosis Date   AICD (automatic cardioverter/defibrillator) present    Alcohol abuse 05/18/2020   Arthritis    "hands; maybe my toes" (12/20/2017)   CAD (coronary artery disease)    Cardiac arrest (HCC) 11/29/2017   hx VF arrest/notes 12/20/2017   CHF (congestive heart failure) (HCC)    Chronic systolic heart failure (HCC) 08/25/2019   Coronary artery disease    Coronary artery disease involving native coronary artery of native heart without angina pectoris 12/07/2017   Essential hypertension 05/18/2020   GERD (gastroesophageal reflux disease)    H/O ETOH abuse    /notes 12/20/2017   High cholesterol    Hypertension    Ischemic cardiomyopathy    a. 08/2019 s/p BSX D232 single lead AICD.   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) 12/30/2019   Syncope and collapse    "last few days" (12/20/2017)   Current Outpatient Medications  Medication Sig Dispense Refill   aspirin EC 81 MG tablet Take 1 tablet (81 mg total) by mouth daily. Swallow whole. 30 tablet 11   atorvastatin (LIPITOR) 80 MG tablet Take 1  tablet (80 mg total) by mouth in the morning. 30 tablet 11   empagliflozin (JARDIANCE) 10 MG TABS tablet Take 1 tablet (10 mg total) by mouth daily. 30 tablet 11   levETIRAcetam (KEPPRA) 500 MG tablet TAKE 1 TABLET BY MOUTH 2 TIMES DAILY 180 tablet 3   losartan (COZAAR) 25 MG tablet Take 1 tablet (25 mg total) by mouth daily. 90 tablet 3   metoprolol succinate (TOPROL XL) 25 MG 24 hr tablet Take 0.5 tablets (12.5 mg total) by mouth daily. 15 tablet 7   Potassium Chloride Crys  ER (KLOR-CON M20 PO) Patient takes 4 tablets by mouth daily.     torsemide (DEMADEX) 20 MG tablet Take 4 tablets (80 mg total) by mouth daily. 120 tablet 11   No current facility-administered medications for this encounter.   BP 120/68   Pulse 80   Wt 65.1 kg (143 lb 9.6 oz)   SpO2 98%   BMI 21.21 kg/m   Wt Readings from Last 3 Encounters:  09/13/23 65.1 kg (143 lb 9.6 oz)  04/16/23 61.4 kg (135 lb 6.4 oz)  04/12/23 59.5 kg (131 lb 3.2 oz)   PHYSICAL EXAM: General:  Well appearing. No respiratory difficulty HEENT: normal Neck: supple. JVD 9 cm. Carotids 2+ bilat; no bruits. No lymphadenopathy or thyromegaly appreciated. Cor: PMI nondisplaced. Regular rate & rhythm. No rubs, gallops or murmurs. Lungs: clear Abdomen: soft, nontender, mildly distended. No hepatosplenomegaly. No bruits or masses. Good bowel sounds. Extremities: no cyanosis, clubbing, rash, edema Neuro: alert & oriented x 3, cranial nerves grossly intact. moves all 4 extremities w/o difficulty. Affect pleasant.   ASSESSMENT & PLAN: 1. Chronic Biventricular HF, ICM  - Echo 11/30/17 25-30% RV normal.   - Echo 03/2018: EF 40-45%  RV normal.  - Echo 5/20 EF ~ 25%. Has ICD.  - Echo 12/2019 EF 30-35% RV normal. - Mixed Ischemic + probable ETOH mediated component  - Echo (10/23): biventricular failure getting worse from LVEF 30-35% Mod RV--> LVEF < 20% severely reduced RV.  -Has [employer] ICD. Latitude  Score =24. No VT detection   -NYHA  II-early III. Mild volume overload, wt up 12 lb in setting of missed meds x 3 days (has pill packs at home). HL Score elevated at 24. Instructed to return to taking meds daily starting back today. Stressed importance of strict med compliance - Continue torsemide 80 mg daily + 80 meq potassium daily.  - Continue Toprol XL 12.5 mg daily. - Continue Jardiance 10 mg daily.  - Continue losartan 25 mg daily.  - Now off digoxin d/t elevated level. - No spiro with h/o gynecomastia. Could consider Inspra but would avoid given h/o poor compliance  - Not a candidate for advanced therapies/transplant with ongoing ETOH/substance abuse and compliance (this includes home inotrope).  - check BMP and BNP today    2. CAD - S/P CABG x 3 2018  - denies CP  - ASA 81 mg  - Lipitor 80 mg  - Metoprolol 12.5 mg    3. Seizure Disorder - On Keppra.  - he denies any recent seizures    4. ETOH Abuse - still drinking beer but not daily  - Discussed cessation.   5. SDOH Asked SW to see today. Needs help with transportation and food.   Follow with APP in 4 wks to reassess volume status.    Robbie Lis, PA-C  09/13/23

## 2023-09-13 NOTE — Progress Notes (Signed)
H&V Care Navigation CSW Progress Note  Clinical Social Worker met with patient to assist with Food Bag.  Patient is participating in a Managed Medicaid Plan:  Yes  CSW assisted patient with Food Bag to bridge food insecurity. [name], LCSW, CCSW-MCS [phone]   SDOH Screenings   Food Insecurity: Food Insecurity Present (09/13/2023)  Housing: Low Risk  (10/02/2022)  Transportation Needs: Unmet Transportation Needs (09/05/2023)  Utilities: Not At Risk (10/02/2022)  Depression (PHQ2-9): Medium Risk (11/08/2022)  Financial Resource Strain: High Risk (02/15/2023)  Tobacco Use: Low Risk  (09/13/2023)

## 2023-09-25 NOTE — Progress Notes (Signed)
Remote ICD transmission.   

## 2023-09-27 ENCOUNTER — Telehealth (HOSPITAL_COMMUNITY): Payer: Self-pay | Admitting: Licensed Clinical Social Worker

## 2023-09-27 NOTE — Telephone Encounter (Signed)
Pt called CSW and expressed difficulty walking, significant increase in SOB, and swelling which was severe enough to prevent him from tieing his shoes.  CSW discussed with medical staff and advised patient to go to the ED today for evaluation.  Burna Sis, LCSW Clinical Social Worker [facility] Desk#: [phone] Cell#: [phone]

## 2023-10-03 ENCOUNTER — Other Ambulatory Visit (HOSPITAL_COMMUNITY): Payer: Self-pay | Admitting: Physician Assistant

## 2023-10-05 ENCOUNTER — Other Ambulatory Visit (HOSPITAL_COMMUNITY): Payer: Self-pay | Admitting: Family Medicine

## 2023-10-05 ENCOUNTER — Other Ambulatory Visit (HOSPITAL_COMMUNITY): Payer: Self-pay | Admitting: Physician Assistant

## 2023-10-05 ENCOUNTER — Telehealth (HOSPITAL_COMMUNITY): Payer: Self-pay | Admitting: Licensed Clinical Social Worker

## 2023-10-05 NOTE — Telephone Encounter (Signed)
Called pt to discuss setting up transportation to appt next week- unable to reach- left Vm requesting return call  Burna Sis, LCSW Clinical Social Worker [facility] Desk#: [phone] Cell#: [phone]

## 2023-10-05 NOTE — Telephone Encounter (Signed)
H&V Care Navigation CSW Progress Note  Clinical Social Worker set up transportation for patient to get to and from upcoming HF Clinic appt:  Pick up from home: 10:15am-10:45am Pick up from appt: 11:30-12pm Ref #: [id number]   Pt also reported being out of meds- CSW called pharmacy and had mail out.  Patient is participating in a Managed Medicaid Plan:  Yes  SDOH Screenings   Food Insecurity: Food Insecurity Present (09/13/2023)  Housing: Low Risk  (10/02/2022)  Transportation Needs: Unmet Transportation Needs (09/05/2023)  Utilities: Not At Risk (10/02/2022)  Depression (PHQ2-9): Medium Risk (11/08/2022)  Financial Resource Strain: High Risk (02/15/2023)  Tobacco Use: Low Risk  (09/13/2023)    Burna Sis, LCSW Clinical Social Worker [facility] Desk#: [phone] Cell#: [phone]

## 2023-10-08 ENCOUNTER — Telehealth (HOSPITAL_COMMUNITY): Payer: Self-pay | Admitting: Licensed Clinical Social Worker

## 2023-10-08 NOTE — Telephone Encounter (Signed)
CSW called and texted pt leaving appt and ride details- unable to speak to pt in person- will continue to attempt  Burna Sis, LCSW Clinical Social Worker [facility] Desk#: [phone] Cell#: [phone]

## 2023-10-09 ENCOUNTER — Encounter: Payer: Self-pay | Admitting: Cardiology

## 2023-10-09 ENCOUNTER — Other Ambulatory Visit (HOSPITAL_COMMUNITY): Payer: Self-pay | Admitting: Physician Assistant

## 2023-10-10 ENCOUNTER — Telehealth (HOSPITAL_COMMUNITY): Payer: Self-pay

## 2023-10-10 NOTE — Telephone Encounter (Signed)
Called and left patient a voice message to confirm/remind patient of their appointment at the [facility] on 10/11/23.   And to bring in all medications and/or complete list.

## 2023-10-11 ENCOUNTER — Ambulatory Visit (HOSPITAL_COMMUNITY)
Admission: RE | Admit: 2023-10-11 | Discharge: 2023-10-11 | Disposition: A | Discharge status: Home / Self Care | Payer: Medicaid Other | Source: Ambulatory Visit | Attending: Cardiology | Admitting: Cardiology

## 2023-10-11 ENCOUNTER — Encounter (HOSPITAL_COMMUNITY): Payer: Self-pay

## 2023-10-11 VITALS — BP 102/64 | HR 83 | Wt 150.4 lb

## 2023-10-11 DIAGNOSIS — I2510 Atherosclerotic heart disease of native coronary artery without angina pectoris: Secondary | ICD-10-CM | POA: Insufficient documentation

## 2023-10-11 DIAGNOSIS — Z59811 Housing instability, housed, with risk of homelessness: Secondary | ICD-10-CM | POA: Diagnosis not present

## 2023-10-11 DIAGNOSIS — F1010 Alcohol abuse, uncomplicated: Secondary | ICD-10-CM | POA: Insufficient documentation

## 2023-10-11 DIAGNOSIS — I5082 Biventricular heart failure: Secondary | ICD-10-CM | POA: Diagnosis present

## 2023-10-11 DIAGNOSIS — E782 Mixed hyperlipidemia: Secondary | ICD-10-CM | POA: Insufficient documentation

## 2023-10-11 DIAGNOSIS — I255 Ischemic cardiomyopathy: Secondary | ICD-10-CM | POA: Insufficient documentation

## 2023-10-11 DIAGNOSIS — G40909 Epilepsy, unspecified, not intractable, without status epilepticus: Secondary | ICD-10-CM | POA: Diagnosis not present

## 2023-10-11 DIAGNOSIS — Z951 Presence of aortocoronary bypass graft: Secondary | ICD-10-CM | POA: Diagnosis not present

## 2023-10-11 DIAGNOSIS — I5022 Chronic systolic (congestive) heart failure: Secondary | ICD-10-CM | POA: Insufficient documentation

## 2023-10-11 DIAGNOSIS — Z5941 Food insecurity: Secondary | ICD-10-CM | POA: Insufficient documentation

## 2023-10-11 DIAGNOSIS — Z5982 Transportation insecurity: Secondary | ICD-10-CM | POA: Insufficient documentation

## 2023-10-11 LAB — LIPID PANEL
Cholesterol: 203 mg/dL — ABNORMAL HIGH (ref 0–200)
HDL: 57 mg/dL (ref >40)
LDL Cholesterol: 121 mg/dL — ABNORMAL HIGH (ref 0–99)
Total CHOL/HDL Ratio: 3.6 {ratio}
Triglycerides: 123 mg/dL (ref <150)
VLDL: 25 mg/dL (ref 0–40)

## 2023-10-11 LAB — COMPREHENSIVE METABOLIC PANEL
ALT: 21 U/L (ref 0–44)
AST: 34 U/L (ref 15–41)
Albumin: 3.6 g/dL (ref 3.5–5.0)
Alkaline Phosphatase: 104 U/L (ref 38–126)
Anion gap: 11 (ref 5–15)
BUN: 8 mg/dL (ref 6–20)
CO2: 32 mmol/L (ref 22–32)
Calcium: 9.1 mg/dL (ref 8.9–10.3)
Chloride: 94 mmol/L — ABNORMAL LOW (ref 98–111)
Creatinine, Ser: 1.01 mg/dL (ref 0.61–1.24)
GFR, Estimated: >60 mL/min (ref >60)
Glucose, Bld: 87 mg/dL (ref 70–99)
Potassium: 3.1 mmol/L — ABNORMAL LOW (ref 3.5–5.1)
Sodium: 137 mmol/L (ref 135–145)
Total Bilirubin: 1.3 mg/dL — ABNORMAL HIGH (ref 0.3–1.2)
Total Protein: 6.9 g/dL (ref 6.5–8.1)

## 2023-10-11 LAB — BRAIN NATRIURETIC PEPTIDE: B Natriuretic Peptide: 469.4 pg/mL — ABNORMAL HIGH (ref 0.0–100.0)

## 2023-10-11 NOTE — Patient Instructions (Addendum)
Labs done today. We will contact you only if your labs are abnormal.  No medication changes were made. Please continue all current medications as prescribed.  Your physician recommends that you schedule a follow-up appointment in: 2-3 months with our NP/PA Clinic here in our office.   If you have any questions or concerns before your next appointment please send [place] a message through [place] or call our office at [phone].    TO LEAVE A MESSAGE FOR THE NURSE SELECT OPTION 2, PLEASE LEAVE A MESSAGE INCLUDING: YOUR NAME DATE OF BIRTH CALL BACK NUMBER REASON FOR CALL**this is important as we prioritize the call backs  YOU WILL RECEIVE A CALL BACK THE SAME DAY AS LONG AS YOU CALL BEFORE 4:00 PM   Do the following things EVERYDAY: Weigh yourself in the morning before breakfast. Write it down and keep it in a log. Take your medicines as prescribed Eat low salt foods--Limit salt (sodium) to 2000 mg per day.  Stay as active as you can everyday Limit all fluids for the day to less than 2 liters   At the [facility], you and your health needs are our priority. As part of our continuing mission to provide you with exceptional heart care, we have created designated Provider Care Teams. These Care Teams include your primary Cardiologist (physician) and Advanced Practice Providers (APPs- Physician Assistants and Nurse Practitioners) who all work together to provide you with the care you need, when you need it.   You may see any of the following providers on your designated Care Team at your next follow up: Dr Arvilla Meres Dr Marca Ancona Dr. Marcos Eke, NP Robbie Lis, [place] [facility] Victoria Vera, [place] Brynda Peon, NP Karle Plumber, PharmD   Please be sure to bring in all your medications bottles to every appointment.    Thank you for choosing [facility]

## 2023-10-11 NOTE — Progress Notes (Signed)
[facility] Note  PCP: Bertram Denver, NP HF Cardiologist: Dr Gala Romney   Reason for Visit: Heart Failure   HPI: [name] is a 62 y.o. male with h/o ETOH abuse, VF Arrest 2018, CAD s/p CABG 12/07/17, and ICM with EF 25-30%.   Admitted 12/18 with VF arrest while chopping wood. Received CPR in the field. R/LHC with severe 3vD as below and cardiogenic shock requiring IABP and milrinone. Required milrinone with continued shock. Pt improved and IABP removed 12/10, but pt had recurrent VF arrest so IABP replaced. Stabilized and underwent CABG 12/07/17. Tolerated gradual wean of meds and extubation with no further events.  Echo 5/20 EF at 25%.    9/20 implantation of a [employer] single chamber ICD.   He had routine clinic f/u on 10/23/19 and while in exam room had a witnessed seizure  with loss of pulse. CPR started with quick ROSC. He did not require shock. Taken to the ED and had another seizure. Neurology consulted. Started on Keppra. ICD interrogated. No arrhythmias noted. Admitted to [facility] on 12/09/19 with recurrent seizures in setting of noncompliance with his Keppra.    Admitted 8/21 with COVID PNA.   Follow up 5/22 he was having facial and arm tingling, no other neuro symptoms. Ethanol level 145. Carvedilol increased and carotid u/s ordered showing bilateral ICA 1-39%.  Follow up 9/22, he was off all meds. Resumed meds and re-enrolled with Paramedicine. Follow up 10/22, he remained off all meds, NYHA II. Meds restarted, but lost to follow up.   Follow up 09/11/22, off all meds except Keppra, felt bad and volume overloaded.  Attempted outpatient diuresis and titration of GDMT but admitted in 10/23 d/t a/c CHF.   Seen for f/u 12/12/22. Had been in [place] ED for belly pain and swelling. Says they gave him an Advil and sent him home. No records to review. Continued to endorse abdominal pain at f/u. Lipase, Tbili, indirect  bilirubin and direct bilirubin elevated. He was referred to GI for workup. Also appeared volume overloaded and IV lasix arranged in infusion clinic.   Had f/u here in the [facility] 2/14 and was volume overloaded w/ NYHA IIIb symptoms. HL score 27. Continued abdominal swelling. Lasix was discontinued and he was started on torsemide 80 mg daily.   He presents today for routine f/u. Reports doing fairly well. Denies resting dyspnea. Does ok w/ ALDs but gets SOB if walking long distances. Denies CP. Denies any recent seizures. Admits that he was out of meds recently for several days while waiting for pharmacy to deliver pill pack. He has received a new supply and has resumed taking. Reports compliance. Tolerating ok w/o side effects. BP soft at 102/64 but no orthostatic symptoms.   He still drinks beer but reports he has scaled back, now only drinking 1-2 beers a day.   Device interrogation shows elevated HL score at 19 but this has been trending down (down from 24 last month). He has has a few runs of NSVT but no device therapies.   ROS: All systems negative except as listed in HPI, PMH and Problem List.  SH:  Social History   Socioeconomic History   Marital status: Divorced    Spouse name: Not on file   Number of children: Not on file   Years of education: Not on file   Highest education  level: Not on file  Occupational History   Not on file  Tobacco Use   Smoking status: Never   Smokeless tobacco: Never  Vaping Use   Vaping status: Never Used  Substance and Sexual Activity   Alcohol use: Yes    Alcohol/week: 14.0 standard drinks of alcohol    Types: 14 Cans of beer per week    Comment: patient endorses at least 1-2 beers daily (04/2020); 03/2023   Drug use: No   Sexual activity: Not Currently  Other Topics Concern   Not on file  Social History Narrative   Lives at home with his brother who is crippled. He takes care of his brother.    Right handed    Social Determinants of Health    Financial Resource Strain: High Risk (02/15/2023)   Overall Financial Resource Strain (CARDIA)    Difficulty of Paying Living Expenses: Very hard  Food Insecurity: Food Insecurity Present (09/13/2023)   Hunger Vital Sign    Worried About Running Out of Food in the Last Year: Often true    Ran Out of Food in the Last Year: Often true  Transportation Needs: Unmet Transportation Needs (10/05/2023)   PRAPARE - Administrator, Civil Service (Medical): Yes    Lack of Transportation (Non-Medical): Yes  Physical Activity: Not on file  Stress: Not on file  Social Connections: Not on file  Intimate Partner Violence: Not on file   FH:  Family History  Problem Relation Age of Onset   Alzheimer's disease Mother    Lung disease Father    Heart attack Other    Diabetes Neg Hx    Colon cancer Neg Hx    Esophageal cancer Neg Hx    Pancreatic cancer Neg Hx    Stomach cancer Neg Hx    Past Medical History:  Diagnosis Date   AICD (automatic cardioverter/defibrillator) present    Alcohol abuse 05/18/2020   Arthritis    "hands; maybe my toes" (12/20/2017)   CAD (coronary artery disease)    Cardiac arrest (HCC) 11/29/2017   hx VF arrest/notes 12/20/2017   CHF (congestive heart failure) (HCC)    Chronic systolic heart failure (HCC) 08/25/2019   Coronary artery disease    Coronary artery disease involving native coronary artery of native heart without angina pectoris 12/07/2017   Essential hypertension 05/18/2020   GERD (gastroesophageal reflux disease)    H/O ETOH abuse    /notes 12/20/2017   High cholesterol    Hypertension    Ischemic cardiomyopathy    a. 08/2019 s/p BSX D232 single lead AICD.   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) 12/30/2019   Syncope and collapse     "last few days" (12/20/2017)   Current Outpatient Medications  Medication Sig Dispense Refill   ASPIRIN LOW DOSE 81 MG tablet TAKE 1 TABLET (81 MG TOTAL) BY MOUTH DAILY. SWALLOW WHOLE. (AM) 30 tablet 11   atorvastatin (LIPITOR) 80 MG tablet TAKE 1 TABLET (80 MG TOTAL) BY MOUTH DAILY. (MORNING) 30 tablet 11   JARDIANCE 10 MG TABS tablet TAKE 1 TABLET (10 MG TOTAL) BY MOUTH DAILY. (AM) 30 tablet 11   KLOR-CON M20 20 MEQ tablet TAKE 1 TABLET BY MOUTH EVERY DAY 30 tablet 3   levETIRAcetam (KEPPRA) 500 MG  tablet TAKE 1 TABLET BY MOUTH 2 TIMES DAILY 180 tablet 3   losartan (COZAAR) 25 MG tablet Take 1 tablet (25 mg total) by mouth daily. 90 tablet 3   metoprolol succinate (TOPROL-XL) 25 MG 24 hr tablet TAKE 1/2 TABLET (12.5 MG TOTAL) BY MOUTH DAILY (AM) 15 tablet 7   Potassium Chloride Crys ER (KLOR-CON M20 PO) Patient takes 4 tablets by mouth daily.     torsemide (DEMADEX) 20 MG tablet Take 4 tablets (80 mg total) by mouth daily. 120 tablet 11   No current facility-administered medications for this visit.   There were no vitals taken for this visit.  Wt Readings from Last 3 Encounters:  09/13/23 65.1 kg (143 lb 9.6 oz)  04/16/23 61.4 kg (135 lb 6.4 oz)  04/12/23 59.5 kg (131 lb 3.2 oz)   PHYSICAL EXAM: General:  Well appearing. No respiratory difficulty HEENT: normal Neck: supple. JVD 8 cm Carotids 2+ bilat; no bruits. No lymphadenopathy or thyromegaly appreciated. Cor: PMI nondisplaced. Regular rate & rhythm. No rubs, gallops or murmurs. Lungs: clear Abdomen: soft, nontender, mildly distended. No hepatosplenomegaly. No bruits or masses. Good bowel sounds. Extremities: no cyanosis, clubbing, rash, no edema Neuro: alert & oriented x 3, cranial nerves grossly intact. moves all 4 extremities w/o difficulty. Affect pleasant.    ASSESSMENT & PLAN: 1. Chronic Biventricular HF, ICM  - Echo 11/30/17 25-30% RV normal.   - Echo 03/2018: EF 40-45%  RV normal.  - Echo 5/20 EF ~ 25%. Has ICD.   - Echo 12/2019 EF 30-35% RV normal. - Mixed Ischemic + probable ETOH mediated component  - Echo (10/23): biventricular failure getting worse from LVEF 30-35% Mod RV--> LVEF < 20% severely reduced RV.  -Has [employer] ICD. Latitude  Score =24. No VT detection   -NYHA II. Euvolemic on exam. HL Score elevated mildly elevated at 19 but index is trending down, HL Score was 24 last month. This is also in the setting of missing meds x several days while waiting on new pill pack from his pharmacy. He now has new supply of meds and reports compliance. BP is soft, limiting GDMT titration today. He is stable w/o orthostatic symptoms  - Continue torsemide 80 mg daily  - Continue Toprol XL 12.5 mg daily. - Continue Jardiance 10 mg daily.  - Continue losartan 25 mg daily.  - Now off digoxin d/t elevated level. - No spiro with h/o gynecomastia. Could consider Inspra but would avoid given h/o poor compliance  - Not a candidate for advanced therapies/transplant with ongoing ETOH/substance abuse and compliance (this includes home inotrope).  - check CMP and BNP today    2. CAD - S/P CABG x 3 2018  - denies anginal symptoms  - Continue ASA 81 mg  - Continue Lipitor 80 mg. Check Lipid Panel and HFTs today. LDL goal < 70   - Continue Toprol XL 12.5 mg daily   3. Seizure Disorder - On Keppra.  - he denies any recent seizures  - followed by outpatient neurology    4. ETOH Abuse - still drinking beer but reports he has scaled back significantly, only 1-2 beers/day  - Discussed cessation.   5. SDOH - Asked SW to see today. Needs help with transportation and food.   F/u in 2 months w/ Dr. Gala Romney or APP    Robbie Lis, PA-C  10/11/23

## 2023-10-11 NOTE — Progress Notes (Signed)
H&V Care Navigation CSW Progress Note  Clinical Social Worker met with pt to check in during visit.  Reports he has some issues with housing at this time.  States that there is confusion over who owns the house and someone has been telling him he needs to leave but he has not received any kind of legal notice.    CSW provided pt with several income based housing applications to work on.  Pt also expressed need for food- provided with Heart and Vascular Food Bag.  Patient is participating in a Managed Medicaid Plan:  Yes  SDOH Screenings   Food Insecurity: Food Insecurity Present (10/11/2023)  Housing: Medium Risk (10/11/2023)  Transportation Needs: Unmet Transportation Needs (10/05/2023)  Utilities: Not At Risk (10/02/2022)  Depression (PHQ2-9): Medium Risk (11/08/2022)  Financial Resource Strain: High Risk (02/15/2023)  Tobacco Use: Low Risk  (10/11/2023)   Burna Sis, LCSW Clinical Social Worker [facility] Desk#: [phone] Cell#: [phone]

## 2023-10-11 NOTE — Addendum Note (Signed)
Encounter addended by: Burna Sis, LCSW on: 10/11/2023 1:50 PM  Actions taken: Flowsheet data copied forward, Flowsheet accepted, Clinical Note Signed

## 2023-10-23 ENCOUNTER — Other Ambulatory Visit (HOSPITAL_COMMUNITY): Payer: Self-pay

## 2023-10-23 ENCOUNTER — Encounter: Payer: Self-pay | Admitting: Cardiology

## 2023-10-25 ENCOUNTER — Telehealth (HOSPITAL_COMMUNITY): Payer: Self-pay | Admitting: *Deleted

## 2023-10-25 NOTE — Telephone Encounter (Signed)
Called patient per Boyce Medici, PA asking him to call us at [phone] option 2 for lab results and instructions.  Called [facility] at [phone] and confirmed patient has been getting potassium and atorvastatin in his pill packs as ordered.

## 2023-11-08 ENCOUNTER — Telehealth (HOSPITAL_COMMUNITY): Payer: Self-pay

## 2023-11-08 ENCOUNTER — Telehealth (HOSPITAL_COMMUNITY): Payer: Self-pay | Admitting: Licensed Clinical Social Worker

## 2023-11-08 DIAGNOSIS — I5022 Chronic systolic (congestive) heart failure: Secondary | ICD-10-CM

## 2023-11-08 MED ORDER — TORSEMIDE 20 MG PO TABS: 80.0000 mg | ORAL_TABLET | Freq: Every day | ORAL | 6 refills | Status: DC

## 2023-11-08 NOTE — Telephone Encounter (Signed)
Patient's torsemide medication has been sent to [facility]  pharmacy per [facility]

## 2023-11-08 NOTE — Telephone Encounter (Signed)
H&V Care Navigation CSW Progress Note  Clinical Social Worker received call from pt that he has moved into a new location- address updated in chart.  CSW called [facility] and provided with updated shipping address for medications.  CSW will assist pt in changing address for other important entities (social security, DHHS)  Patient is participating in a Managed Medicaid Plan:  Yes  SDOH Screenings   Food Insecurity: Food Insecurity Present (10/11/2023)  Housing: Medium Risk (10/11/2023)  Transportation Needs: Unmet Transportation Needs (10/05/2023)  Utilities: Not At Risk (10/02/2022)  Depression (PHQ2-9): Medium Risk (11/08/2022)  Financial Resource Strain: High Risk (02/15/2023)  Tobacco Use: Low Risk  (10/11/2023)   Burna Sis, LCSW Clinical Social Worker [facility] Desk#: [phone] Cell#: [phone]

## 2023-11-15 ENCOUNTER — Telehealth (HOSPITAL_COMMUNITY): Payer: Self-pay | Admitting: Licensed Clinical Social Worker
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
## 2023-12-10 ENCOUNTER — Telehealth (HOSPITAL_COMMUNITY): Payer: Self-pay | Admitting: Licensed Clinical Social Worker

## 2023-12-10 NOTE — Telephone Encounter (Signed)
H&V Care Navigation CSW Progress Note  Clinical Social Worker set up transport for appt on 12/19 with clinic  Pick up from home: 9:55-10:25 Pick up from appt: 12:30pm Ride ID: [id number]  Patient is participating in a Managed Medicaid Plan:  Yes  SDOH Screenings   Food Insecurity: Food Insecurity Present (10/11/2023)  Housing: Medium Risk (10/11/2023)  Transportation Needs: Unmet Transportation Needs (10/05/2023)  Utilities: Not At Risk (10/02/2022)  Depression (PHQ2-9): Medium Risk (11/08/2022)  Financial Resource Strain: High Risk (02/15/2023)  Tobacco Use: Low Risk  (10/11/2023)    Burna Sis, LCSW Clinical Social Worker [facility] Desk#: [phone] Cell#: [phone]

## 2023-12-12 ENCOUNTER — Telehealth (HOSPITAL_COMMUNITY): Payer: Self-pay

## 2023-12-12 NOTE — Telephone Encounter (Signed)
Called and left patient a voice message to confirm/remind patient of their appointment at the [facility] on 12/13/23.

## 2023-12-13 ENCOUNTER — Ambulatory Visit: Payer: Medicaid Other

## 2023-12-13 ENCOUNTER — Encounter (HOSPITAL_COMMUNITY): Payer: Medicaid Other

## 2023-12-14 ENCOUNTER — Other Ambulatory Visit (HOSPITAL_COMMUNITY): Payer: Self-pay | Admitting: Family Medicine

## 2023-12-17 ENCOUNTER — Telehealth (HOSPITAL_COMMUNITY): Payer: Self-pay | Admitting: Licensed Clinical Social Worker

## 2023-12-17 NOTE — Telephone Encounter (Signed)
H&V Care Navigation CSW Progress Note  Clinical Social Worker assisted pt in setting up dentist appt as patient reports multiple broken teeth.  Appt at [facility] in [place] on Jan 6th at 1:30pm  Pt also rescheduled with [facility] after no show to appt last week  CSW will follow up with patient for reminders and transportation assistance.  Patient is participating in a Managed Medicaid Plan:  Yes  SDOH Screenings   Food Insecurity: Food Insecurity Present (10/11/2023)  Housing: Medium Risk (10/11/2023)  Transportation Needs: Unmet Transportation Needs (10/05/2023)  Utilities: Not At Risk (10/02/2022)  Depression (PHQ2-9): Medium Risk (11/08/2022)  Financial Resource Strain: High Risk (02/15/2023)  Tobacco Use: Low Risk  (10/11/2023)    Burna Sis, LCSW Clinical Social Worker [facility] Desk#: [phone] Cell#: [phone]

## 2023-12-24 ENCOUNTER — Telehealth (HOSPITAL_COMMUNITY): Payer: Self-pay | Admitting: Licensed Clinical Social Worker

## 2023-12-24 NOTE — Telephone Encounter (Signed)
H&V Care Navigation CSW Progress Note  Clinical Social Worker arranged medicaid transport for patient to upcoming appts  1/6 appt for dentist Pick up from home 12:25-12:55pm Pick up from appt 3pm Ref #: [id number]  1/20 appt with HF Clinic Pick up from home 12:20pm-12:50pm Pick up from appt 2:30pm Ref # [id number]  Patient is participating in a Managed Medicaid Plan:  Yes  SDOH Screenings   Food Insecurity: Food Insecurity Present (10/11/2023)  Housing: Medium Risk (10/11/2023)  Transportation Needs: Unmet Transportation Needs (10/05/2023)  Utilities: Not At Risk (10/02/2022)  Depression (PHQ2-9): Medium Risk (11/08/2022)  Financial Resource Strain: High Risk (02/15/2023)  Tobacco Use: Low Risk  (10/11/2023)    Burna Sis, LCSW Clinical Social Worker [facility] Desk#: [phone] Cell#: [phone]

## 2023-12-28 ENCOUNTER — Telehealth (HOSPITAL_COMMUNITY): Payer: Self-pay | Admitting: Licensed Clinical Social Worker

## 2023-12-28 NOTE — Telephone Encounter (Signed)
 H&V Care Navigation CSW Progress Note  Clinical Social Worker called pt to remind of dentist appt Monday- patient is aware and no barriers to attending.  Patient is participating in a Managed Medicaid Plan:  Yes  SDOH Screenings   Food Insecurity: Food Insecurity Present (10/11/2023)  Housing: Medium Risk (10/11/2023)  Transportation Needs: Unmet Transportation Needs (12/24/2023)  Utilities: Not At Risk (10/02/2022)  Depression (PHQ2-9): Medium Risk (11/08/2022)  Financial Resource Strain: High Risk (02/15/2023)  Tobacco Use: Low Risk  (10/11/2023)   Andriette HILARIO Leech, LCSW Clinical Social Worker [facility] Desk#: [phone] Cell#: [phone]

## 2024-01-04 ENCOUNTER — Telehealth (HOSPITAL_COMMUNITY): Payer: Self-pay | Admitting: Licensed Clinical Social Worker

## 2024-01-04 NOTE — Telephone Encounter (Signed)
 Attempted to call to check in- unable to reach  Burna Sis, LCSW Clinical Social Worker [facility] Desk#: [phone] Cell#: [phone]

## 2024-01-07 NOTE — Progress Notes (Signed)
ADVANCED HF CLINIC NOTE   PCP: Bertram Denver, NP HF Cardiologist: Dr Gala Romney   Reason for Visit: Heart Failure f/u  HPI: [name] is a 63 y.o. male with h/o ETOH abuse, VF Arrest 2018, CAD s/p CABG 12/07/17, and ICM with EF 25-30%.   Admitted 12/18 with VF arrest while chopping wood. Received CPR in the field. R/LHC with severe 3vD as below and cardiogenic shock requiring IABP and milrinone. Required milrinone with continued shock. Pt improved and IABP removed 12/10, but pt had recurrent VF arrest so IABP replaced. Stabilized and underwent CABG 12/07/17. Tolerated gradual wean of meds and extubation with no further events.  Echo 5/20 EF at 25%.    9/20 implantation of a [employer] single chamber ICD.   Routine clinic f/u on 10/23/19 and while in exam room had a witnessed seizure with loss of pulse. CPR started with quick ROSC. He did not require shock. Taken to the ED and had another seizure. Neurology consulted. Started on Keppra. ICD interrogated. No arrhythmias noted. Ad[facility] to [facility] on 12/09/19 with recurrent seizures in setting of noncompliance with his Keppra.    Admitted 8/21 with COVID PNA.   Follow up 5/22 he was having facial and arm tingling, no other neuro symptoms. Ethanol level 145. Carvedilol increased and carotid u/s ordered showing bilateral ICA 1-39%.  Follow up 9/22, he was off all meds. Resumed meds and re-enrolled with Paramedicine. Follow up 10/22, he remained off all meds, NYHA II. Meds restarted, but lost to follow up.   Follow up 09/11/22, off all meds except Keppra, felt bad and volume overloaded.  Attempted outpatient diuresis and titration of GDMT but admitted in 10/23 d/t a/c CHF.   Seen for f/u 12/12/22. Had been in [place] ED for belly pain and swelling. Says they gave him an Advil and sent him home. No records to review. Continued to endorse abdominal pain at f/u. Lipase, Tbili, indirect bilirubin and direct bilirubin  elevated. He was referred to GI for workup. Also appeared volume overloaded and IV lasix arranged in infusion clinic.   Had f/u here in the [facility] 2/14 and was volume overloaded w/ NYHA IIIb symptoms. HL score 27. Continued abdominal swelling. Lasix was discontinued and he was started on torsemide 80 mg daily.   Today he returns for AHF follow up, stressed about taking care of her girlfriend who got hit by a car as she was riding her bike and is now paraplegic. Overall feeling ok. Denies palpitations, CP, dizziness, edema, or PND/Orthopnea. SOB ok, gets winded with activity. Appetite ok. No fever or chills. Does not weight at home. Taking all medications. Denies tobacco or drug use. Has been drinking more than usual 3-5 beers/day. Denies any recent seizures.   Device interrogation shows elevated HL score at 24. No VT  ROS: All systems negative except as listed in HPI, PMH and Problem List.  SH:  Social History   Socioeconomic History   Marital status: Divorced    Spouse name: Not on file   Number of children: Not on file   Years of education: Not on file   Highest education level: Not on file  Occupational History   Not on file  Tobacco Use   Smoking status: Never   Smokeless tobacco: Never  Vaping Use   Vaping status: Never Used  Substance and Sexual Activity   Alcohol use: Yes    Alcohol/week: 14.0 standard drinks of alcohol    Types: 14 Cans of  beer per week    Comment: patient endorses at least 1-2 beers daily (04/2020); 03/2023   Drug use: No   Sexual activity: Not Currently  Other Topics Concern   Not on file  Social History Narrative   Lives at home with his brother who is crippled. He takes care of his brother.    Right handed    Social Drivers of Health   Financial Resource Strain: High Risk (02/15/2023)   Overall Financial Resource Strain (CARDIA)    Difficulty of Paying Living Expenses: Very hard  Food Insecurity: Food Insecurity Present (10/11/2023)   Hunger Vital  Sign    Worried About Running Out of Food in the Last Year: Often true    Ran Out of Food in the Last Year: Often true  Transportation Needs: Unmet Transportation Needs (12/24/2023)   PRAPARE - Administrator, Civil Service (Medical): Yes    Lack of Transportation (Non-Medical): Yes  Physical Activity: Not on file  Stress: Not on file  Social Connections: Not on file  Intimate Partner Violence: Not on file   FH:  Family History  Problem Relation Age of Onset   Alzheimer's disease Mother    Lung disease Father    Heart attack Other    Diabetes Neg Hx    Colon cancer Neg Hx    Esophageal cancer Neg Hx    Pancreatic cancer Neg Hx    Stomach cancer Neg Hx    Past Medical History:  Diagnosis Date   AICD (automatic cardioverter/defibrillator) present    Alcohol abuse 05/18/2020   Arthritis    "hands; maybe my toes" (12/20/2017)   CAD (coronary artery disease)    Cardiac arrest (HCC) 11/29/2017   hx VF arrest/notes 12/20/2017   CHF (congestive heart failure) (HCC)    Chronic systolic heart failure (HCC) 08/25/2019   Coronary artery disease    Coronary artery disease involving native coronary artery of native heart without angina pectoris 12/07/2017   Essential hypertension 05/18/2020   GERD (gastroesophageal reflux disease)    H/O ETOH abuse    /notes 12/20/2017   High cholesterol    Hypertension    Ischemic cardiomyopathy    a. 08/2019 s/p BSX D232 single lead AICD.   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) 12/30/2019   Syncope and collapse    "last few days" (12/20/2017)   Current Outpatient Medications  Medication Sig Dispense Refill   ASPIRIN LOW DOSE 81 MG tablet TAKE 1 TABLET (81 MG TOTAL) BY MOUTH DAILY. SWALLOW WHOLE. (AM) 30 tablet 11    atorvastatin (LIPITOR) 80 MG tablet TAKE 1 TABLET (80 MG TOTAL) BY MOUTH DAILY. (MORNING) 30 tablet 11   ezetimibe (ZETIA) 10 MG tablet Take 1 tablet (10 mg total) by mouth daily. 90 tablet 3   JARDIANCE 10 MG TABS tablet TAKE 1 TABLET (10 MG TOTAL) BY MOUTH DAILY. (AM) 30 tablet 11   levETIRAcetam (KEPPRA) 500 MG tablet TAKE 1 TABLET BY MOUTH 2 TIMES DAILY 180 tablet 3   losartan (COZAAR) 25 MG tablet TAKE 1 TABLET (25 MG TOTAL) BY MOUTH DAILY(AM) 90 tablet 3   metoprolol succinate (TOPROL-XL) 25 MG 24 hr tablet TAKE 1/2 TABLET (12.5 MG TOTAL) BY MOUTH DAILY (AM) 15 tablet 7   potassium chloride  SA (KLOR-CON M20) 20 MEQ tablet Take 5 tablets (100 mEq total) by mouth daily. 150 tablet 3   torsemide (DEMADEX) 20 MG tablet Take 5 tablets (100 mg total) by mouth daily. 150 tablet 6   No current facility-administered medications for this encounter.   BP 102/60   Pulse 78   Wt 67.9 kg (149 lb 12.8 oz)   SpO2 98%   BMI 22.12 kg/m   Wt Readings from Last 3 Encounters:  01/14/24 67.9 kg (149 lb 12.8 oz)  10/11/23 68.2 kg (150 lb 6.4 oz)  09/13/23 65.1 kg (143 lb 9.6 oz)   PHYSICAL EXAM: General:  well appearing.  No respiratory difficulty. Walked into clinic HEENT: normal Neck: supple. JVD ~10 cm. Carotids 2+ bilat; no bruits. No lymphadenopathy or thyromegaly appreciated. Cor: PMI nondisplaced. Regular rate & rhythm. No rubs, gallops or murmurs. Lungs: clear Abdomen: soft, nontender, nondistended. No hepatosplenomegaly. No bruits or masses. Good bowel sounds. Extremities: no cyanosis, clubbing, rash, trace ankle edema  Neuro: alert & oriented x 3, cranial nerves grossly intact. moves all 4 extremities w/o difficulty. Affect pleasant.   ASSESSMENT & PLAN: 1. Chronic Biventricular HF, ICM  - Echo 11/30/17 25-30% RV normal.   - Echo 03/2018: EF 40-45%  RV normal.  - Echo 5/20 EF ~ 25%. Has ICD.  - Echo 12/2019 EF 30-35% RV normal. - Mixed Ischemic + probable ETOH mediated component  - Echo  (10/23): biventricular failure getting worse from LVEF 30-35% Mod RV--> LVEF < 20% severely reduced RV.  -Has [employer] ICD. Latitude  Score =24. No VT detection   -NYHA II-IIIa. Volume mildly elevated. HL Score elevated at 24. Suspect with increased ETOH use. BP is soft, limiting GDMT titration today. He is stable w/o orthostatic symptoms  - Increase torsemide 80>100 mg daily and increase KDUR 80>100 mEq daily - Continue Toprol XL 12.5 mg daily. - Continue Jardiance 10 mg daily.  - Continue losartan 25 mg daily.  - Now off digoxin d/t elevated level. - No spiro with h/o gynecomastia. Could consider Inspra but would avoid given h/o poor compliance  - Not a candidate for advanced therapies/transplant with ongoing ETOH/substance abuse and compliance (this includes home inotrope).  - check CMP and BNP today    2. CAD - S/P CABG x 3 2018  - denies anginal symptoms  - Continue ASA 81 mg  - Continue Lipitor 80 mg. LDL 121 10/24 < 70   - Start Zetia 10 mg daily  - Continue Toprol XL 12.5 mg daily   3. Seizure Disorder - On Keppra.  - he denies any recent seizures  - followed by outpatient neurology    4. ETOH Abuse - Drinking 3-5 beers a day, discussed cessation.   5. SDOH - Asked SW to see today. Needs help with food.   F/u in 2 months w/ Dr. Gala Romney or APP   Alen Bleacher, AGACNP-BC  01/14/24

## 2024-01-10 ENCOUNTER — Telehealth (HOSPITAL_COMMUNITY): Payer: Self-pay | Admitting: Licensed Clinical Social Worker

## 2024-01-10 NOTE — Telephone Encounter (Signed)
H&V Care Navigation CSW Progress Note  Clinical Social Worker called pt to remind of upcoming appt- unable to reach- left VM with transport details.  Patient is participating in a Managed Medicaid Plan:  Yes  SDOH Screenings   Food Insecurity: Food Insecurity Present (10/11/2023)  Housing: Medium Risk (10/11/2023)  Transportation Needs: Unmet Transportation Needs (12/24/2023)  Utilities: Not At Risk (10/02/2022)  Depression (PHQ2-9): Medium Risk (11/08/2022)  Financial Resource Strain: High Risk (02/15/2023)  Tobacco Use: Low Risk  (10/11/2023)    Burna Sis, LCSW Clinical Social Worker [facility] Desk#: [phone] Cell#: [phone]

## 2024-01-11 ENCOUNTER — Telehealth (HOSPITAL_COMMUNITY): Payer: Self-pay

## 2024-01-11 NOTE — Telephone Encounter (Signed)
Called and was unable to leave patient a voice message to confirm/remind patient of their appointment at the [facility] on 01/14/24.

## 2024-01-14 ENCOUNTER — Ambulatory Visit (HOSPITAL_COMMUNITY)
Admission: RE | Admit: 2024-01-14 | Discharge: 2024-01-14 | Disposition: A | Discharge status: Home / Self Care | Payer: Medicaid Other | Source: Ambulatory Visit | Attending: Internal Medicine

## 2024-01-14 ENCOUNTER — Encounter (HOSPITAL_COMMUNITY): Payer: Self-pay

## 2024-01-14 VITALS — BP 102/60 | HR 78 | Wt 149.8 lb

## 2024-01-14 DIAGNOSIS — Z8674 Personal history of sudden cardiac arrest: Secondary | ICD-10-CM | POA: Diagnosis not present

## 2024-01-14 DIAGNOSIS — I5082 Biventricular heart failure: Secondary | ICD-10-CM | POA: Diagnosis not present

## 2024-01-14 DIAGNOSIS — F1010 Alcohol abuse, uncomplicated: Secondary | ICD-10-CM | POA: Insufficient documentation

## 2024-01-14 DIAGNOSIS — Z951 Presence of aortocoronary bypass graft: Secondary | ICD-10-CM | POA: Insufficient documentation

## 2024-01-14 DIAGNOSIS — Z79899 Other long term (current) drug therapy: Secondary | ICD-10-CM | POA: Diagnosis not present

## 2024-01-14 DIAGNOSIS — Z5941 Food insecurity: Secondary | ICD-10-CM | POA: Diagnosis not present

## 2024-01-14 DIAGNOSIS — I255 Ischemic cardiomyopathy: Secondary | ICD-10-CM | POA: Insufficient documentation

## 2024-01-14 DIAGNOSIS — Z5986 Financial insecurity: Secondary | ICD-10-CM | POA: Diagnosis not present

## 2024-01-14 DIAGNOSIS — Z5982 Transportation insecurity: Secondary | ICD-10-CM | POA: Insufficient documentation

## 2024-01-14 DIAGNOSIS — Z636 Dependent relative needing care at home: Secondary | ICD-10-CM | POA: Insufficient documentation

## 2024-01-14 DIAGNOSIS — I5022 Chronic systolic (congestive) heart failure: Secondary | ICD-10-CM | POA: Diagnosis not present

## 2024-01-14 DIAGNOSIS — G40909 Epilepsy, unspecified, not intractable, without status epilepticus: Secondary | ICD-10-CM | POA: Insufficient documentation

## 2024-01-14 DIAGNOSIS — I2510 Atherosclerotic heart disease of native coronary artery without angina pectoris: Secondary | ICD-10-CM | POA: Diagnosis not present

## 2024-01-14 DIAGNOSIS — R569 Unspecified convulsions: Secondary | ICD-10-CM

## 2024-01-14 LAB — BASIC METABOLIC PANEL
Anion gap: 10 (ref 5–15)
BUN: 13 mg/dL (ref 6–20)
CO2: 29 mmol/L (ref 22–32)
Calcium: 8.7 mg/dL — ABNORMAL LOW (ref 8.9–10.3)
Chloride: 95 mmol/L — ABNORMAL LOW (ref 98–111)
Creatinine, Ser: 1.17 mg/dL (ref 0.61–1.24)
GFR, Estimated: >60 mL/min (ref >60)
Glucose, Bld: 106 mg/dL — ABNORMAL HIGH (ref 70–99)
Potassium: 3.3 mmol/L — ABNORMAL LOW (ref 3.5–5.1)
Sodium: 134 mmol/L — ABNORMAL LOW (ref 135–145)

## 2024-01-14 LAB — BRAIN NATRIURETIC PEPTIDE: B Natriuretic Peptide: 363.4 pg/mL — ABNORMAL HIGH (ref 0.0–100.0)

## 2024-01-14 MED ORDER — TORSEMIDE 20 MG PO TABS: 100.0000 mg | ORAL_TABLET | Freq: Every day | ORAL | 6 refills | Status: DC

## 2024-01-14 MED ORDER — EZETIMIBE 10 MG PO TABS: 10.0000 mg | ORAL_TABLET | Freq: Every day | ORAL | 3 refills | Status: DC

## 2024-01-14 MED ORDER — POTASSIUM CHLORIDE CRYS ER 20 MEQ PO TBCR: 100.0000 meq | EXTENDED_RELEASE_TABLET | Freq: Every day | ORAL | 3 refills | Status: DC

## 2024-01-14 NOTE — Progress Notes (Addendum)
H&V Care Navigation CSW Progress Note  Clinical Social Worker met with pt in clinic to check in.  Reports he needs food at this time.  Patient gets food stamps but states they never came in on 15th like they are supposed to.  Pt gave his phone away and doesn't have reliable signal to call people.  Informed pt he needs to call me ASAP with EBT card so we can verify if any money was deposited or if he needs to call [employer] to figure out why they have been stopped.  Heart and Vascular food bag given.  Pt has lack of transportation but states his girlfriends sister has driven them to food pantries when needed.  Will continue to follow and assist as needed  Patient is participating in a Managed Medicaid Plan:  Yes  SDOH Screenings   Food Insecurity: Food Insecurity Present (10/11/2023)  Housing: Medium Risk (10/11/2023)  Transportation Needs: Unmet Transportation Needs (12/24/2023)  Utilities: Not At Risk (10/02/2022)  Depression (PHQ2-9): Medium Risk (11/08/2022)  Financial Resource Strain: High Risk (02/15/2023)  Tobacco Use: Low Risk  (01/14/2024)   Burna Sis, LCSW Clinical Social Worker [facility] Desk#: [phone] Cell#: [phone]

## 2024-01-14 NOTE — Addendum Note (Signed)
Encounter addended by: Baird Cancer, RN on: 01/14/2024 4:09 PM  Actions taken: Pharmacy for encounter modified, Order list changed, Diagnosis association updated

## 2024-01-14 NOTE — Patient Instructions (Signed)
Medication Changes:  INCREASE TORSEMIDE TO 100MG  (5) TABLETS ONCE DAILY   INCREASE POTASSIUM TO (5) TABLETS ONCE DAILY   START: ZETIA 10MG  ONCE DAILY   Lab Work:  Labs done today, your results will be available in MyChart, we will contact you for abnormal readings.  Follow-Up in: 2 MONTHS AS SCHEDULED WITH APP   At the [facility], you and your health needs are our priority. We have a designated team specialized in the treatment of Heart Failure. This Care Team includes your primary Heart Failure Specialized Cardiologist (physician), Advanced Practice Providers (APPs- Physician Assistants and Nurse Practitioners), and Pharmacist who all work together to provide you with the care you need, when you need it.   You may see any of the following providers on your designated Care Team at your next follow up:  Dr. Arvilla Meres Dr. Marca Ancona Dr. Dorthula Nettles Dr. Theresia Bough Tonye Becket, NP Robbie Lis, [place] [facility] [place], [place] Brynda Peon, NP [place] Lee, NP Karle Plumber, PharmD   Please be sure to bring in all your medications bottles to every appointment.   Need to Contact us:  If you have any questions or concerns before your next appointment please send [place] a message through [place] or call our office at [phone].    TO LEAVE A MESSAGE FOR THE NURSE SELECT OPTION 2, PLEASE LEAVE A MESSAGE INCLUDING: YOUR NAME DATE OF BIRTH CALL BACK NUMBER REASON FOR CALL**this is important as we prioritize the call backs  YOU WILL RECEIVE A CALL BACK THE SAME DAY AS LONG AS YOU CALL BEFORE 4:00 PM

## 2024-01-14 NOTE — Addendum Note (Signed)
Encounter addended by: Burna Sis, LCSW on: 01/14/2024 2:59 PM  Actions taken: Flowsheet accepted, Clinical Note Signed

## 2024-01-15 ENCOUNTER — Other Ambulatory Visit (HOSPITAL_COMMUNITY): Payer: Self-pay | Admitting: Cardiology

## 2024-01-15 DIAGNOSIS — I5022 Chronic systolic (congestive) heart failure: Secondary | ICD-10-CM

## 2024-01-15 MED ORDER — TORSEMIDE 100 MG PO TABS: 100.0000 mg | ORAL_TABLET | Freq: Every day | ORAL | 6 refills | Status: DC

## 2024-01-15 NOTE — Telephone Encounter (Signed)
Insurance will not pay for 20 x 5 tabs daily  Script updated

## 2024-01-16 ENCOUNTER — Other Ambulatory Visit (HOSPITAL_COMMUNITY): Payer: Self-pay

## 2024-01-16 DIAGNOSIS — I5022 Chronic systolic (congestive) heart failure: Secondary | ICD-10-CM

## 2024-01-16 MED ORDER — TORSEMIDE 100 MG PO TABS: 100.0000 mg | ORAL_TABLET | Freq: Every day | ORAL | 5 refills | Status: DC

## 2024-01-22 NOTE — Progress Notes (Signed)
Marland Kitchen

## 2024-02-25 ENCOUNTER — Telehealth (HOSPITAL_COMMUNITY): Payer: Self-pay | Admitting: Licensed Clinical Social Worker

## 2024-02-25 NOTE — Telephone Encounter (Signed)
 CSW attempted to call pt to discuss transportation to upcoming appt on 3/20- phone not working and patient has not reached out with new phone number  Burna Sis, LCSW Clinical Social Worker [facility] Desk#: [phone] Cell#: [phone]

## 2024-03-12 ENCOUNTER — Telehealth (HOSPITAL_COMMUNITY): Payer: Self-pay

## 2024-03-12 NOTE — Telephone Encounter (Signed)
 Called both phone numbers in pt's chart to confirm/remind patient of their appointment at the [facility] on 03/13/24.   Appointment:   [] Confirmed  [] Left mess   [] No answer/No voice mail  [x] Phone not in service

## 2024-03-13 ENCOUNTER — Encounter (HOSPITAL_COMMUNITY): Payer: Medicaid Other

## 2024-05-16 ENCOUNTER — Telehealth: Payer: Self-pay

## 2024-05-16 NOTE — Telephone Encounter (Signed)
 Pt called stating that he doesn't have any medication and that he's "filling up with water."  Patient is not a patient of [facility] but is a Heart Failure patient.  Pt asked for the social worker's number which was given to him.  Pt stated he would call the Child psychotherapist. Pt was advised to go to the ED or call EMS to pick him and take him to the ED.

## 2024-06-12 ENCOUNTER — Telehealth: Payer: Self-pay | Admitting: Nurse Practitioner

## 2024-06-12 ENCOUNTER — Ambulatory Visit: Payer: Self-pay

## 2024-06-12 ENCOUNTER — Other Ambulatory Visit: Payer: Self-pay

## 2024-06-12 ENCOUNTER — Inpatient Hospital Stay (HOSPITAL_COMMUNITY)
Admission: EM | Admit: 2024-06-12 | Discharge: 2024-06-16 | DRG: 291 | Disposition: A | Discharge status: Home / Self Care | Attending: Internal Medicine | Admitting: Internal Medicine

## 2024-06-12 ENCOUNTER — Encounter (HOSPITAL_COMMUNITY): Payer: Self-pay

## 2024-06-12 ENCOUNTER — Emergency Department (HOSPITAL_COMMUNITY)

## 2024-06-12 DIAGNOSIS — Z888 Allergy status to other drugs, medicaments and biological substances status: Secondary | ICD-10-CM

## 2024-06-12 DIAGNOSIS — Z951 Presence of aortocoronary bypass graft: Secondary | ICD-10-CM

## 2024-06-12 DIAGNOSIS — I428 Other cardiomyopathies: Secondary | ICD-10-CM | POA: Diagnosis present

## 2024-06-12 DIAGNOSIS — J9600 Acute respiratory failure, unspecified whether with hypoxia or hypercapnia: Secondary | ICD-10-CM

## 2024-06-12 DIAGNOSIS — E782 Mixed hyperlipidemia: Secondary | ICD-10-CM

## 2024-06-12 DIAGNOSIS — I447 Left bundle-branch block, unspecified: Secondary | ICD-10-CM | POA: Diagnosis present

## 2024-06-12 DIAGNOSIS — I2489 Other forms of acute ischemic heart disease: Secondary | ICD-10-CM | POA: Diagnosis present

## 2024-06-12 DIAGNOSIS — Z7982 Long term (current) use of aspirin: Secondary | ICD-10-CM

## 2024-06-12 DIAGNOSIS — R609 Edema, unspecified: Secondary | ICD-10-CM | POA: Diagnosis not present

## 2024-06-12 DIAGNOSIS — Z95810 Presence of automatic (implantable) cardiac defibrillator: Secondary | ICD-10-CM | POA: Diagnosis not present

## 2024-06-12 DIAGNOSIS — Z79899 Other long term (current) drug therapy: Secondary | ICD-10-CM | POA: Diagnosis not present

## 2024-06-12 DIAGNOSIS — G40909 Epilepsy, unspecified, not intractable, without status epilepticus: Secondary | ICD-10-CM

## 2024-06-12 DIAGNOSIS — I110 Hypertensive heart disease with heart failure: Secondary | ICD-10-CM | POA: Diagnosis not present

## 2024-06-12 DIAGNOSIS — R0602 Shortness of breath: Secondary | ICD-10-CM | POA: Diagnosis not present

## 2024-06-12 DIAGNOSIS — Z8249 Family history of ischemic heart disease and other diseases of the circulatory system: Secondary | ICD-10-CM | POA: Diagnosis not present

## 2024-06-12 DIAGNOSIS — I493 Ventricular premature depolarization: Secondary | ICD-10-CM | POA: Diagnosis present

## 2024-06-12 DIAGNOSIS — Z8674 Personal history of sudden cardiac arrest: Secondary | ICD-10-CM

## 2024-06-12 DIAGNOSIS — J9601 Acute respiratory failure with hypoxia: Secondary | ICD-10-CM

## 2024-06-12 DIAGNOSIS — G40209 Localization-related (focal) (partial) symptomatic epilepsy and epileptic syndromes with complex partial seizures, not intractable, without status epilepticus: Secondary | ICD-10-CM | POA: Diagnosis present

## 2024-06-12 DIAGNOSIS — I255 Ischemic cardiomyopathy: Secondary | ICD-10-CM | POA: Diagnosis present

## 2024-06-12 DIAGNOSIS — I5043 Acute on chronic combined systolic (congestive) and diastolic (congestive) heart failure: Principal | ICD-10-CM | POA: Diagnosis present

## 2024-06-12 DIAGNOSIS — I509 Heart failure, unspecified: Secondary | ICD-10-CM

## 2024-06-12 DIAGNOSIS — I5041 Acute combined systolic (congestive) and diastolic (congestive) heart failure: Secondary | ICD-10-CM | POA: Diagnosis not present

## 2024-06-12 DIAGNOSIS — F1090 Alcohol use, unspecified, uncomplicated: Secondary | ICD-10-CM

## 2024-06-12 DIAGNOSIS — G40409 Other generalized epilepsy and epileptic syndromes, not intractable, without status epilepticus: Secondary | ICD-10-CM

## 2024-06-12 DIAGNOSIS — Z7984 Long term (current) use of oral hypoglycemic drugs: Secondary | ICD-10-CM | POA: Diagnosis not present

## 2024-06-12 DIAGNOSIS — R9431 Abnormal electrocardiogram [ECG] [EKG]: Secondary | ICD-10-CM | POA: Diagnosis not present

## 2024-06-12 DIAGNOSIS — E876 Hypokalemia: Secondary | ICD-10-CM | POA: Diagnosis present

## 2024-06-12 DIAGNOSIS — Z8679 Personal history of other diseases of the circulatory system: Secondary | ICD-10-CM

## 2024-06-12 DIAGNOSIS — Z91148 Patient's other noncompliance with medication regimen for other reason: Secondary | ICD-10-CM

## 2024-06-12 DIAGNOSIS — R569 Unspecified convulsions: Secondary | ICD-10-CM

## 2024-06-12 DIAGNOSIS — I5023 Acute on chronic systolic (congestive) heart failure: Secondary | ICD-10-CM | POA: Diagnosis not present

## 2024-06-12 DIAGNOSIS — I2510 Atherosclerotic heart disease of native coronary artery without angina pectoris: Secondary | ICD-10-CM | POA: Diagnosis present

## 2024-06-12 DIAGNOSIS — F1020 Alcohol dependence, uncomplicated: Secondary | ICD-10-CM | POA: Diagnosis present

## 2024-06-12 DIAGNOSIS — Z820 Family history of epilepsy and other diseases of the nervous system: Secondary | ICD-10-CM

## 2024-06-12 DIAGNOSIS — I5022 Chronic systolic (congestive) heart failure: Secondary | ICD-10-CM

## 2024-06-12 HISTORY — DX: Alcohol use, unspecified, uncomplicated: F10.90

## 2024-06-12 HISTORY — DX: Heart failure, unspecified: I50.9

## 2024-06-12 HISTORY — DX: Patient's other noncompliance with medication regimen for other reason: Z91.148

## 2024-06-12 HISTORY — DX: Personal history of other diseases of the circulatory system: Z86.79

## 2024-06-12 HISTORY — DX: Acute respiratory failure with hypoxia: J96.01

## 2024-06-12 LAB — CBC WITH DIFFERENTIAL/PLATELET
Abs Immature Granulocytes: 0.03 10*3/uL (ref 0.00–0.07)
Basophils Absolute: 0.1 10*3/uL (ref 0.0–0.1)
Basophils Relative: 1 %
Eosinophils Absolute: 0.2 10*3/uL (ref 0.0–0.5)
Eosinophils Relative: 4 %
HCT: 39.5 % (ref 39.0–52.0)
Hemoglobin: 12.4 g/dL — ABNORMAL LOW (ref 13.0–17.0)
Immature Granulocytes: 1 %
Lymphocytes Relative: 23 %
Lymphs Abs: 1.5 10*3/uL (ref 0.7–4.0)
MCH: 30.8 pg (ref 26.0–34.0)
MCHC: 31.4 g/dL (ref 30.0–36.0)
MCV: 98.3 fL (ref 80.0–100.0)
Monocytes Absolute: 1.1 10*3/uL — ABNORMAL HIGH (ref 0.1–1.0)
Monocytes Relative: 16 %
Neutro Abs: 3.7 10*3/uL (ref 1.7–7.7)
Neutrophils Relative %: 55 %
Platelets: 238 10*3/uL (ref 150–400)
RBC: 4.02 MIL/uL — ABNORMAL LOW (ref 4.22–5.81)
RDW: 15.4 % (ref 11.5–15.5)
WBC: 6.6 10*3/uL (ref 4.0–10.5)
nRBC: 0 % (ref 0.0–0.2)

## 2024-06-12 LAB — COMPREHENSIVE METABOLIC PANEL WITH GFR
ALT: 22 U/L (ref 0–44)
AST: 32 U/L (ref 15–41)
Albumin: 3.2 g/dL — ABNORMAL LOW (ref 3.5–5.0)
Alkaline Phosphatase: 76 U/L (ref 38–126)
Anion gap: 7 (ref 5–15)
BUN: 6 mg/dL — ABNORMAL LOW (ref 8–23)
CO2: 23 mmol/L (ref 22–32)
Calcium: 8.4 mg/dL — ABNORMAL LOW (ref 8.9–10.3)
Chloride: 106 mmol/L (ref 98–111)
Creatinine, Ser: 1.08 mg/dL (ref 0.61–1.24)
GFR, Estimated: >60 mL/min (ref >60)
Glucose, Bld: 97 mg/dL (ref 70–99)
Potassium: 3.6 mmol/L (ref 3.5–5.1)
Sodium: 136 mmol/L (ref 135–145)
Total Bilirubin: 0.9 mg/dL (ref 0.0–1.2)
Total Protein: 6.4 g/dL — ABNORMAL LOW (ref 6.5–8.1)

## 2024-06-12 LAB — TROPONIN I (HIGH SENSITIVITY)
Troponin I (High Sensitivity): 18 ng/L — ABNORMAL HIGH (ref <18)
Troponin I (High Sensitivity): 19 ng/L — ABNORMAL HIGH (ref <18)

## 2024-06-12 LAB — BRAIN NATRIURETIC PEPTIDE: B Natriuretic Peptide: 762.9 pg/mL — ABNORMAL HIGH (ref 0.0–100.0)

## 2024-06-12 MED ORDER — EZETIMIBE 10 MG PO TABS
10.0000 mg | ORAL_TABLET | Freq: Every day | ORAL | Status: DC
  Administered 2024-06-12 – 2024-06-16 (×5): 10 mg via ORAL
  Filled 2024-06-12 (×5): qty 1

## 2024-06-12 MED ORDER — FUROSEMIDE 10 MG/ML IJ SOLN
60.0000 mg | Freq: Once | INTRAMUSCULAR | Status: AC
  Administered 2024-06-12: 60 mg via INTRAVENOUS
  Filled 2024-06-12: qty 6

## 2024-06-12 MED ORDER — POTASSIUM CHLORIDE CRYS ER 20 MEQ PO TBCR
20.0000 meq | EXTENDED_RELEASE_TABLET | Freq: Every day | ORAL | Status: DC
  Administered 2024-06-12 – 2024-06-13 (×2): 20 meq via ORAL
  Filled 2024-06-12 (×2): qty 1

## 2024-06-12 MED ORDER — ACETAMINOPHEN 650 MG RE SUPP
650.0000 mg | Freq: Four times a day (QID) | RECTAL | Status: DC | PRN
  Filled 2024-06-12: qty 1

## 2024-06-12 MED ORDER — LOSARTAN POTASSIUM 25 MG PO TABS
25.0000 mg | ORAL_TABLET | Freq: Every day | ORAL | Status: DC
  Administered 2024-06-12 – 2024-06-14 (×3): 25 mg via ORAL
  Filled 2024-06-12 (×3): qty 1

## 2024-06-12 MED ORDER — ASPIRIN 81 MG PO TBEC
81.0000 mg | DELAYED_RELEASE_TABLET | Freq: Once | ORAL | Status: AC
  Administered 2024-06-12: 81 mg via ORAL
  Filled 2024-06-12: qty 1

## 2024-06-12 MED ORDER — ACETAMINOPHEN 325 MG PO TABS
650.0000 mg | ORAL_TABLET | Freq: Four times a day (QID) | ORAL | Status: DC | PRN
  Administered 2024-06-14 – 2024-06-15 (×2): 650 mg via ORAL
  Filled 2024-06-12 (×2): qty 2

## 2024-06-12 MED ORDER — SODIUM CHLORIDE 0.9% FLUSH
3.0000 mL | Freq: Two times a day (BID) | INTRAVENOUS | Status: DC
  Administered 2024-06-12 – 2024-06-16 (×5): 3 mL via INTRAVENOUS

## 2024-06-12 MED ORDER — SODIUM CHLORIDE 0.9% FLUSH: 3.0000 mL | INTRAVENOUS | Status: DC | PRN

## 2024-06-12 MED ORDER — ENOXAPARIN SODIUM 40 MG/0.4ML IJ SOSY
40.0000 mg | PREFILLED_SYRINGE | INTRAMUSCULAR | Status: DC
  Administered 2024-06-12 – 2024-06-15 (×4): 40 mg via SUBCUTANEOUS
  Filled 2024-06-12 (×4): qty 0.4

## 2024-06-12 MED ORDER — LEVETIRACETAM 500 MG PO TABS
500.0000 mg | ORAL_TABLET | Freq: Two times a day (BID) | ORAL | Status: DC
  Administered 2024-06-12 – 2024-06-16 (×8): 500 mg via ORAL
  Filled 2024-06-12 (×8): qty 1

## 2024-06-12 MED ORDER — SODIUM CHLORIDE 0.9 % IV SOLN: 250.0000 mL | INTRAVENOUS | Status: AC | PRN

## 2024-06-12 MED ORDER — ATORVASTATIN CALCIUM 80 MG PO TABS
80.0000 mg | ORAL_TABLET | Freq: Every day | ORAL | Status: DC
  Administered 2024-06-12 – 2024-06-16 (×5): 80 mg via ORAL
  Filled 2024-06-12: qty 1
  Filled 2024-06-12: qty 2
  Filled 2024-06-12 (×3): qty 1

## 2024-06-12 MED ORDER — FUROSEMIDE 10 MG/ML IJ SOLN
40.0000 mg | Freq: Two times a day (BID) | INTRAMUSCULAR | Status: AC
Start: 2024-06-13 — End: ?
  Administered 2024-06-13 – 2024-06-14 (×4): 40 mg via INTRAVENOUS
  Filled 2024-06-12 (×4): qty 4

## 2024-06-12 MED ORDER — EMPAGLIFLOZIN 10 MG PO TABS
10.0000 mg | ORAL_TABLET | Freq: Every day | ORAL | Status: DC
  Administered 2024-06-12 – 2024-06-16 (×5): 10 mg via ORAL
  Filled 2024-06-12 (×5): qty 1

## 2024-06-12 MED ORDER — TRIMETHOBENZAMIDE HCL 100 MG/ML IM SOLN: 200.0000 mg | Freq: Four times a day (QID) | INTRAMUSCULAR | Status: DC | PRN

## 2024-06-12 MED ORDER — METOPROLOL SUCCINATE ER 25 MG PO TB24
12.5000 mg | ORAL_TABLET | Freq: Every day | ORAL | Status: DC
  Administered 2024-06-12 – 2024-06-13 (×2): 12.5 mg via ORAL
  Filled 2024-06-12 (×2): qty 1

## 2024-06-12 NOTE — ED Triage Notes (Signed)
 Pt here for [facility] for a few days, hx of CHF and HTN. Denies CP. C/O feet and ankle swelling.

## 2024-06-12 NOTE — ED Notes (Signed)
 Alert, NAD, calm, interactive. c/o sob and ankle swelling. Denies pain, NVD, dizziness or fever. Pedal edema noted. States, ran out of diuretic, and potassium.Takes daily ASA.

## 2024-06-12 NOTE — ED Notes (Signed)
 EDP at [facility]

## 2024-06-12 NOTE — H&P (Addendum)
 History and Physical    [name] FMW:[id number] DOB: Jun 05, 1961 DOA:   PCP: Theotis Haze ORN, NP   Patient coming from: Home   Chief Complaint:  Chief Complaint  Patient presents with   Shortness of Breath   ED TRIAGE note:Pt here for [facility] for a few days, hx of CHF and HTN. Denies CP. C/O feet and ankle swelling.   HPI:  [name] is a 63 y.o. male with medical history significant of heart failure with reduced ejection fraction less than 20%, ventricular fibrillation status post cardiac arrest 2018, CAD status post CABG 2019, chronic alcohol use, hyperlipidemia and seizure disorder presented to emergency department complaining of orthopnea, dyspnea and bilateral lower extremity swelling for last few weeks.  Patient reported he has been ran out of his fluid pills for last 1 month.  Patient denies any chest pain, palpitation or chest discomfort.  Denies any fever and chills.   ED Course:  At presentation to ED patient O2 sat dropped to 88% room air currently maintained 100% lateral oxygen, heart rate variable in between 54-1 01.  Blood pressure within good range. Flat troponin 18 and 19. EKG shows sinus rhythm with premature ventricular complex, left bundle blanch block and heart rate 95. Elevated BNP 762. CBC unremarkable stable H&H. CMP unremarkable except low albumin  3.2.  Chest x-ray showed cardiomegaly with mild central pulmonary vascular congestion. In the ED patient has been given IV Lasix  60 mg.  Hospitalist has been consulted for further evaluation management of acute hypoxic respiratory failure in the context of acute CHF exacerbation.    Significant labs in the ED: Lab Orders         CBC with Differential/Platelet         Brain natriuretic peptide         Comprehensive metabolic panel with GFR         Comprehensive metabolic panel         CBC         HIV Antibody (routine testing w rflx)       Review of Systems:  Review of Systems   Constitutional:  Positive for malaise/fatigue. Negative for chills, fever and weight loss.  Respiratory:  Positive for cough. Negative for sputum production, shortness of breath and wheezing.   Cardiovascular:  Positive for orthopnea, leg swelling and PND. Negative for chest pain and palpitations.  Gastrointestinal:  Negative for abdominal pain, heartburn, nausea and vomiting.  Musculoskeletal:  Negative for myalgias.  Neurological:  Negative for dizziness and headaches.  Psychiatric/Behavioral:  The patient is not nervous/anxious.     Past Medical History:  Diagnosis Date   AICD (automatic cardioverter/defibrillator) present    Alcohol abuse 05/18/2020   Arthritis    hands; maybe my toes (12/20/2017)   CAD (coronary artery disease)    Cardiac arrest (HCC) 11/29/2017   hx VF arrest/notes 12/20/2017   CHF (congestive heart failure) (HCC)    Chronic systolic heart failure (HCC) 08/25/2019   Coronary artery disease    Coronary artery disease involving native coronary artery of native heart without angina pectoris 12/07/2017   Essential hypertension 05/18/2020   GERD (gastroesophageal reflux disease)    H/O ETOH abuse    /notes 12/20/2017   High cholesterol    Hypertension    Ischemic cardiomyopathy    a. 08/2019 s/p BSX D232 single lead AICD.   Mixed hyperlipidemia 05/18/2020   Seizure (HCC) 10/21/2019   Seizures (HCC) ~ 2016; ~ 2017   I've  had a couple; I was at work (12/20/2017)   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, 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) 12/30/2019   Syncope and collapse    last few days (12/20/2017)    Past Surgical History:  Procedure Laterality Date   CARDIAC CATHETERIZATION     CORONARY ARTERY BYPASS GRAFT N/A 12/07/2017   Procedure: CORONARY ARTERY BYPASS GRAFTING (CABG) times four using left internal mammary artery and right saphenous vein using endoscope for harvest.;  Surgeon: Fleeta Hanford Coy, MD;  Location: [facility]  OR;  Service: Open Heart Surgery;  Laterality: N/A;   I & D EXTREMITY Left 05/20/2020   Procedure: IRRIGATION AND DEBRIDEMENT OF WRIST;  Surgeon: Carolee Lynwood JINNY DOUGLAS, MD;  Location: [facility] OR;  Service: Orthopedics;  Laterality: Left;   IABP INSERTION N/A 11/29/2017   Procedure: IABP Insertion;  Surgeon: Mady Bruckner, MD;  Location: [facility] INVASIVE CV LAB;  Service: Cardiovascular;  Laterality: N/A;   IABP INSERTION N/A 12/03/2017   Procedure: IABP INSERTION;  Surgeon: Cherrie Toribio SAUNDERS, MD;  Location: [facility] INVASIVE CV LAB;  Service: Cardiovascular;  Laterality: N/A;   ICD IMPLANT N/A 08/29/2019   Procedure: ICD IMPLANT;  Surgeon: Waddell Danelle ORN, MD;  Location: [facility] INVASIVE CV LAB;  Service: Cardiovascular;  Laterality: N/A;   LEFT HEART CATH AND CORONARY ANGIOGRAPHY N/A 11/29/2017   Procedure: LEFT HEART CATH AND CORONARY ANGIOGRAPHY;  Surgeon: Mady Bruckner, MD;  Location: [facility] INVASIVE CV LAB;  Service: Cardiovascular;  Laterality: N/A;   RIGHT HEART CATH N/A 11/29/2017   Procedure: RIGHT HEART CATH;  Surgeon: Cherrie Toribio SAUNDERS, MD;  Location: [facility] INVASIVE CV LAB;  Service: Cardiovascular;  Laterality: N/A;   TEE WITHOUT CARDIOVERSION N/A 12/07/2017   Procedure: TRANSESOPHAGEAL ECHOCARDIOGRAM (TEE);  Surgeon: Fleeta Hanford, Coy, MD;  Location: [facility] OR;  Service: Open Heart Surgery;  Laterality: N/A;   TONSILLECTOMY AND ADENOIDECTOMY  ~ 1972     reports that he has never smoked. He has never used smokeless tobacco. He reports current alcohol use of about 14.0 standard drinks of alcohol per week. He reports that he does not use drugs.  Allergies  Allergen Reactions   Aldactone  [Spironolactone ] Other (See Comments)    Gynecomastia     Family History  Problem Relation Age of Onset   Alzheimer's disease Mother    Lung disease Father    Heart attack Other    Diabetes Neg Hx    Colon cancer Neg Hx    Esophageal cancer Neg Hx    Pancreatic cancer Neg Hx    Stomach cancer Neg Hx     Prior to  Admission medications   Medication Sig Start Date End Date Taking? Authorizing Provider  ASPIRIN  LOW DOSE 81 MG tablet TAKE 1 TABLET (81 MG TOTAL) BY MOUTH DAILY. SWALLOW WHOLE. (AM) 10/05/23   Milford, Harlene HERO, FNP  atorvastatin  (LIPITOR ) 80 MG tablet TAKE 1 TABLET (80 MG TOTAL) BY MOUTH DAILY. (MORNING) 10/05/23   Milford, Harlene HERO, FNP  ezetimibe  (ZETIA ) 10 MG tablet Take 1 tablet (10 mg total) by mouth daily. 01/14/24 04/13/24  Hayes Beckey CROME, NP  JARDIANCE  10 MG TABS tablet TAKE 1 TABLET (10 MG TOTAL) BY MOUTH DAILY. (AM) 10/05/23   Milford, Harlene HERO, FNP  levETIRAcetam  (KEPPRA ) 500 MG tablet TAKE 1 TABLET BY MOUTH 2 TIMES DAILY 01/04/21   Ines Onetha NOVAK, MD  losartan  (COZAAR ) 25 MG tablet TAKE 1 TABLET (25 MG TOTAL) BY MOUTH DAILY(AM) 12/14/23   Milford, Skyline  M, FNP  metoprolol  succinate (TOPROL -XL) 25 MG 24 hr tablet TAKE 1/2 TABLET (12.5 MG TOTAL) BY MOUTH DAILY (AM) 10/05/23   Colletta Manuelita Garre, PA-C  potassium chloride  SA (KLOR-CON  M20) 20 MEQ tablet Take 5 tablets (100 mEq total) by mouth daily. 01/14/24   Hayes Beckey CROME, NP  torsemide  (DEMADEX ) 100 MG tablet Take 1 tablet (100 mg total) by mouth daily. 01/16/24   Hayes Beckey CROME, NP     Physical Exam: Vitals:   06/12/24 1756 06/12/24 1837 06/12/24 1845 06/12/24 1847  BP: (!) 155/110 (!) 146/112    Pulse: (!) 101 (!) 54    Resp: 18 20    Temp: 97.6 F (36.4 C) 97.7 F (36.5 C)    SpO2: 100% 100% (!) 88% 100%  Weight:      Height:        Physical Exam Constitutional:      General: He is not in acute distress.    Appearance: He is well-developed. He is ill-appearing.   Cardiovascular:     Rate and Rhythm: Normal rate and regular rhythm.  Pulmonary:     Effort: Pulmonary effort is normal.     Breath sounds: Normal breath sounds. No decreased breath sounds, wheezing, rhonchi or rales.   Musculoskeletal:     Cervical back: Neck supple.     Right lower leg: Edema present.     Left lower leg: Edema present.   Skin:     General: Skin is warm.     Capillary Refill: Capillary refill takes less than 2 seconds.   Neurological:     Mental Status: He is alert and oriented to person, place, and time.   Psychiatric:        Mood and Affect: Mood normal. Mood is not anxious.        Behavior: Behavior is not agitated.      Labs on Admission: I have personally reviewed following labs and imaging studies  CBC: Recent Labs  Lab 06/12/24 1152  WBC 6.6  NEUTROABS 3.7  HGB 12.4*  HCT 39.5  MCV 98.3  PLT 238   Basic Metabolic Panel: Recent Labs  Lab 06/12/24 1152  NA 136  K 3.6  CL 106  CO2 23  GLUCOSE 97  BUN 6*  CREATININE 1.08  CALCIUM  8.4*   GFR: Estimated Creatinine Clearance: 69.1 mL/min (by C-G formula based on SCr of 1.08 mg/dL). Liver Function Tests: Recent Labs  Lab 06/12/24 1152  AST 32  ALT 22  ALKPHOS 76  BILITOT 0.9  PROT 6.4*  ALBUMIN  3.2*   No results for input(s): LIPASE, AMYLASE in the last 168 hours. No results for input(s): AMMONIA in the last 168 hours. Coagulation Profile: No results for input(s): INR, PROTIME in the last 168 hours. Cardiac Enzymes: Recent Labs  Lab 06/12/24 1152 06/12/24 1400  TROPONINIHS 18* 19*   BNP (last 3 results) Recent Labs    10/11/23 1236 01/14/24 1406 06/12/24 1152  BNP 469.4* 363.4* 762.9*   HbA1C: No results for input(s): HGBA1C in the last 72 hours. CBG: No results for input(s): GLUCAP in the last 168 hours. Lipid Profile: No results for input(s): CHOL, HDL, LDLCALC, TRIG, CHOLHDL, LDLDIRECT in the last 72 hours. Thyroid  Function Tests: No results for input(s): TSH, T4TOTAL, FREET4, T3FREE, THYROIDAB in the last 72 hours. Anemia Panel: No results for input(s): VITAMINB12, FOLATE, FERRITIN, TIBC, IRON, RETICCTPCT in the last 72 hours. Urine analysis:    Component Value Date/Time   COLORURINE COLORLESS (  A) 01/08/2020 1922   APPEARANCEUR CLEAR 01/08/2020 1922   LABSPEC  1.002 (L) 01/08/2020 1922   PHURINE 7.0 01/08/2020 1922   GLUCOSEU NEGATIVE 01/08/2020 1922   HGBUR NEGATIVE 01/08/2020 1922   BILIRUBINUR NEGATIVE 01/08/2020 1922   KETONESUR NEGATIVE 01/08/2020 1922   PROTEINUR NEGATIVE 01/08/2020 1922   NITRITE NEGATIVE 01/08/2020 1922   LEUKOCYTESUR NEGATIVE 01/08/2020 1922    Radiological Exams on Admission: I have personally reviewed images DG Chest 2 View Result Date: 06/12/2024 CLINICAL DATA:  sob, leg swelling EXAM: CHEST - 2 VIEW COMPARISON:  January 16, 2023 FINDINGS: Central pulmonary vascular congestion. Elevation of the left hemidiaphragm with streaky left basilar atelectasis. No focal airspace consolidation, pleural effusion, or pneumothorax. Unchanged cardiomegaly. Left chest pacemaker/AICD with a single lead terminating in the right ventricle. CABG markers and sternotomy wires. No acute fracture or destructive lesion. IMPRESSION: Unchanged cardiomegaly with mild central pulmonary vascular congestion. No overt pulmonary edema or pleural effusions. Electronically Signed   By: Rogelia Myers M.D.   On: 06/12/2024 13:33     EKG: My personal interpretation of EKG shows: Normal sinus rhythm with premature ventricular complex, heart rate 94.  Prolonged QTc 527    Assessment/Plan: Principal Problem:   Acute exacerbation of CHF (congestive heart failure) (HCC) Active Problems:   Mixed hyperlipidemia   Seizure disorder (HCC)   S/P CABG x 4   Prolonged QT interval   History of ventricular fibrillation   History of CAD (coronary artery disease)   H/O medication noncompliance   Acute hypoxic respiratory failure (HCC)   Alcohol use    Assessment and Plan: Acute exacerbation of CHF Chronic systolic and diastolic heart failure with reduced EF < 20% Acute hypoxic respiratory failure-secondary to CHF exacerbation Elevated troponin secondary to demand ischemia -Present emergency department complaining of orthopnea, dyspnea, PND and bilateral  lower extremity swelling which has been progressively getting worse.  Patient reported has been ran out of medications for last 1 month has been taking diuretics borrowing from friends. -At presentation to ED patient O2 sat dropped to 8% room air which has been improved to 100% to liter oxygen. - CBC CMP unremarkable.  Elevated BNP around 800.  Flat troponin 18 and 19.  EKG showing normal sinus rhythm with premature ventricular complex heart rate 94 and prolonged QTc interval.  Chest x-ray showing cardiomegaly with central pulmonary vascular congestion. --Respiratory failure in the setting of CHF exacerbation.  Elevated troponin secondary to demand ischemia in the context of CHF exacerbation.  At this time there is no concern for acute coronary syndrome. - In the ED patient has been given Lasix  60 mg IV./O and monitor urine output and assess volume status on daily basis. - Continue IV Lasix  40 mg twice daily.  Resuming losartan , Jardiance  and Toprol -XL 12.5 mg daily. - Per chart review of the previous cardiology note intolerance to spironolactone  due to gynecomastia. -Obtaining echocardiogram and continue cardiac monitoring. - Consulted advanced heart failure team.    History of CAD status post CABG - Tenia aspirin , Toprol -XL, ezetimibe  and Lipitor   Prolonged QT interval Continue to monitor EKG for further prolongation of QTc.  Potassium within normal range.  Monitor electrolytes.  Chronic hypokalemia - Restarting potassium supplement 20 mEq daily.  Medication noncompliance -Counseled patient at bedside for being compliance with medications.  Hyperlipidemia -Continue ezetimibe  and Lipitor   Seizure disorder -Continue Keppra   Chronic alcohol use - Patient reported he has been drinking 3 to 4 cans of beer on basis and last drink  yesterday evening. -Starting CIWA protocol and Ativan  as needed, folic acid , thiamine  and multivitamin. - Continue cardiac monitoring. - Counseled at bedside  for cessation of alcohol use.  DVT prophylaxis:  Lovenox  Code Status:  Full Code Diet: Heart healthy diet with fluid restriction 2 L/day and salt restriction 2 g/day Disposition Plan: Monitor improvement of volume status with IV diuretics.  Pending echocardiogram. Consults: Advanced heart failure team Admission status:   Inpatient, Telemetry bed  Severity of Illness: The appropriate patient status for this patient is INPATIENT. Inpatient status is judged to be reasonable and necessary in order to provide the required intensity of service to ensure the patient's safety. The patient's presenting symptoms, physical exam findings, and initial radiographic and laboratory data in the context of their chronic comorbidities is felt to place them at high risk for further clinical deterioration. Furthermore, it is not anticipated that the patient will be medically stable for discharge from the hospital within 2 midnights of admission.   * I certify that at the point of admission it is my clinical judgment that the patient will require inpatient hospital care spanning beyond 2 midnights from the point of admission due to high intensity of service, high risk for further deterioration and high frequency of surveillance required.[name]    Jose Stokke, MD [facility]  How to contact the [facility] Attending or Consulting provider 7A - 7P or covering provider during after hours 7P -7A, for this patient.  Check the care team in [facility] and look for a) attending/consulting [facility] provider listed and b) the [facility] team listed Log into [email or web address] and use [place]'s universal password to access. If you do not have the password, please contact the hospital operator. Locate the [facility] provider you are looking for under [facility] and page to a number that you can be directly reached. If you still have difficulty reaching the provider, please page the [facility] (Director on Call) for the Hospitalists listed on amion for  assistance.  06/12/2024, 7:40 PM

## 2024-06-12 NOTE — ED Notes (Signed)
 ED TO INPATIENT HANDOFF REPORT  ED Nurse Name and Phone #: [phone]  S Name/Age/Gender [name] 63 y.o. male Room/Bed: 016C/016C  Code Status   Code Status: Full Code  Home/SNF/Other Home Patient oriented to: self, place, time, and situation Is this baseline? Yes   Triage Complete: Triage complete  Chief Complaint Acute exacerbation of CHF (congestive heart failure) (HCC) [I50.9]  Triage Note Pt here for [facility] for a few days, hx of CHF and HTN. Denies CP. C/O feet and ankle swelling.    Allergies Allergies  Allergen Reactions   Aldactone  [Spironolactone ] Other (See Comments)    Gynecomastia     Level of Care/Admitting Diagnosis ED Disposition     ED Disposition  Admit   Condition  --   Comment  Hospital Area: [facility] [[id number]]  Level of Care: Telemetry Cardiac [103]  May admit patient to [facility] or [facility] if equivalent level of care is available:: No  Covid Evaluation: Asymptomatic - no recent exposure (last 10 days) testing not required  Diagnosis: Acute exacerbation of CHF (congestive heart failure) [facility]) [[id number]]  Admitting Physician: SUNDIL, SUBRINA [[id number]]  Attending Physician: SUNDIL, SUBRINA [[id number]]  Certification:: I certify this patient will need inpatient services for at least 2 midnights  Expected Medical Readiness: 06/17/2024          B Medical/Surgery History Past Medical History:  Diagnosis Date   AICD (automatic cardioverter/defibrillator) present    Alcohol abuse 05/18/2020   Arthritis    hands; maybe my toes (12/20/2017)   CAD (coronary artery disease)    Cardiac arrest (HCC) 11/29/2017   hx VF arrest/notes 12/20/2017   CHF (congestive heart failure) (HCC)    Chronic systolic heart failure (HCC) 08/25/2019   Coronary artery disease    Coronary artery disease involving native coronary artery of native heart without angina pectoris 12/07/2017   Essential hypertension 05/18/2020   GERD  (gastroesophageal reflux disease)    H/O ETOH abuse    /notes 12/20/2017   High cholesterol    Hypertension    Ischemic cardiomyopathy    a. 08/2019 s/p BSX D232 single lead AICD.   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) 12/30/2019   Syncope and collapse    last few days (12/20/2017)   Past Surgical History:  Procedure Laterality Date   CARDIAC CATHETERIZATION     CORONARY ARTERY BYPASS GRAFT N/A 12/07/2017   Procedure: CORONARY ARTERY BYPASS GRAFTING (CABG) times four using left internal mammary artery and right saphenous vein using endoscope for harvest.;  Surgeon: Heriberto London, MD;  Location: [facility] OR;  Service: Open Heart Surgery;  Laterality: N/A;   I & D EXTREMITY Left 05/20/2020   Procedure: IRRIGATION AND DEBRIDEMENT OF WRIST;  Surgeon: Oralia Bills, MD;  Location: [facility] OR;  Service: Orthopedics;  Laterality: Left;   IABP INSERTION N/A 11/29/2017   Procedure: IABP Insertion;  Surgeon: Sammy Crisp, MD;  Location: [facility] INVASIVE CV LAB;  Service: Cardiovascular;  Laterality: N/A;   IABP INSERTION N/A 12/03/2017   Procedure: IABP INSERTION;  Surgeon: Mardell Shade, MD;  Location: [facility] INVASIVE CV LAB;  Service: Cardiovascular;  Laterality: N/A;   ICD IMPLANT N/A 08/29/2019   Procedure: ICD IMPLANT;  Surgeon: Tammie Fall, MD;  Location:  [facility] INVASIVE CV LAB;  Service: Cardiovascular;  Laterality: N/A;   LEFT HEART CATH AND CORONARY ANGIOGRAPHY N/A 11/29/2017   Procedure: LEFT HEART CATH AND CORONARY ANGIOGRAPHY;  Surgeon: Sammy Crisp, MD;  Location: [facility] INVASIVE CV LAB;  Service: Cardiovascular;  Laterality: N/A;   RIGHT HEART CATH N/A 11/29/2017   Procedure: RIGHT HEART CATH;  Surgeon: Mardell Shade, MD;  Location: [facility] INVASIVE CV LAB;  Service:  Cardiovascular;  Laterality: N/A;   TEE WITHOUT CARDIOVERSION N/A 12/07/2017   Procedure: TRANSESOPHAGEAL ECHOCARDIOGRAM (TEE);  Surgeon: Matt Song, Donata Fryer, MD;  Location: [facility] OR;  Service: Open Heart Surgery;  Laterality: N/A;   TONSILLECTOMY AND ADENOIDECTOMY  ~ 1972     A IV Location/Drains/Wounds Patient Lines/Drains/Airways Status     Active Line/Drains/Airways     Name Placement date Placement time Site Days   Peripheral IV 06/12/24 18 G Right Antecubital 06/12/24  1153  Antecubital  less than 1            Intake/Output Last 24 hours  Intake/Output Summary (Last 24 hours) at 06/12/2024 2013 Last data filed at 06/12/2024 2011 Gross per 24 hour  Intake --  Output 1100 ml  Net -1100 ml    Labs/Imaging Results for orders placed or performed during the hospital encounter of 06/12/24 (from the past 48 hours)  CBC with Differential/Platelet     Status: Abnormal   Collection Time: 06/12/24 11:52 AM  Result Value Ref Range   WBC 6.6 4.0 - 10.5 K/uL   RBC 4.02 (L) 4.22 - 5.81 MIL/uL   Hemoglobin 12.4 (L) 13.0 - 17.0 g/dL   HCT 16.1 09.6 - 04.5 %   MCV 98.3 80.0 - 100.0 fL   MCH 30.8 26.0 - 34.0 pg   MCHC 31.4 30.0 - 36.0 g/dL   RDW 40.9 81.1 - 91.4 %   Platelets 238 150 - 400 K/uL   nRBC 0.0 0.0 - 0.2 %   Neutrophils Relative % 55 %   Neutro Abs 3.7 1.7 - 7.7 K/uL   Lymphocytes Relative 23 %   Lymphs Abs 1.5 0.7 - 4.0 K/uL   Monocytes Relative 16 %   Monocytes Absolute 1.1 (H) 0.1 - 1.0 K/uL   Eosinophils Relative 4 %   Eosinophils Absolute 0.2 0.0 - 0.5 K/uL   Basophils Relative 1 %   Basophils Absolute 0.1 0.0 - 0.1 K/uL   Immature Granulocytes 1 %   Abs Immature Granulocytes 0.03 0.00 - 0.07 K/uL    Comment: Performed at [facility], [address] [place], [place] [address]  Brain natriuretic peptide     Status: Abnormal   Collection Time: 06/12/24 11:52 AM  Result Value Ref Range   B Natriuretic Peptide 762.9 (H) 0.0 - 100.0 pg/mL    Comment:  Performed at [facility] [place] [facility], [address], [place], [place] [address]  Comprehensive metabolic panel with GFR     Status: Abnormal   Collection Time: 06/12/24 11:52 AM  Result Value Ref Range   Sodium 136 135 - 145 mmol/L   Potassium 3.6 3.5 - 5.1 mmol/L   Chloride 106 98 - 111 mmol/L   CO2 23 22 - 32 mmol/L   Glucose, Bld 97 70 - 99 mg/dL    Comment: Glucose reference range applies only to samples taken after fasting for at least 8 hours.   BUN 6 (L) 8 - 23 mg/dL   Creatinine, Ser 8.65 0.61 - 1.24 mg/dL   Calcium  8.4 (L)  8.9 - 10.3 mg/dL   Total Protein 6.4 (L) 6.5 - 8.1 g/dL   Albumin  3.2 (L) 3.5 - 5.0 g/dL   AST 32 15 - 41 U/L   ALT 22 0 - 44 U/L   Alkaline Phosphatase 76 38 - 126 U/L   Total Bilirubin 0.9 0.0 - 1.2 mg/dL   GFR, Estimated >16 >10 mL/min    Comment: (NOTE) Calculated using the CKD-EPI Creatinine Equation (2021)    Anion gap 7 5 - 15    Comment: Performed at [facility], [address] [place], [place] [address]  Troponin I (High Sensitivity)     Status: Abnormal   Collection Time: 06/12/24 11:52 AM  Result Value Ref Range   Troponin I (High Sensitivity) 18 (H) <18 ng/L    Comment: (NOTE) Elevated high sensitivity troponin I (hsTnI) values and significant  changes across serial measurements may suggest ACS but many other  chronic and acute conditions are known to elevate hsTnI results.  Refer to the Links section for chest pain algorithms and additional  guidance. Performed at [facility], [address], [place], [place] [address]   Troponin I (High Sensitivity)     Status: Abnormal   Collection Time: 06/12/24  2:00 PM  Result Value Ref Range   Troponin I (High Sensitivity) 19 (H) <18 ng/L    Comment: (NOTE) Elevated high sensitivity troponin I (hsTnI) values and significant  changes across serial measurements may suggest ACS but many other  chronic and acute conditions are known to elevate hsTnI results.  Refer to the Links  section for chest pain algorithms and additional  guidance. Performed at [facility], [address], [place], [place] [address]    DG Chest 2 View Result Date: 06/12/2024 CLINICAL DATA:  sob, leg swelling EXAM: CHEST - 2 VIEW COMPARISON:  January 16, 2023 FINDINGS: Central pulmonary vascular congestion. Elevation of the left hemidiaphragm with streaky left basilar atelectasis. No focal airspace consolidation, pleural effusion, or pneumothorax. Unchanged cardiomegaly. Left chest pacemaker/AICD with a single lead terminating in the right ventricle. CABG markers and sternotomy wires. No acute fracture or destructive lesion. IMPRESSION: Unchanged cardiomegaly with mild central pulmonary vascular congestion. No overt pulmonary edema or pleural effusions. Electronically Signed   By: Rance Burrows M.D.   On: 06/12/2024 13:33    Pending Labs Unresulted Labs (From admission, onward)     Start     Ordered   06/13/24 0500  Comprehensive metabolic panel  Tomorrow morning,   R        06/12/24 1927   06/13/24 0500  CBC  Tomorrow morning,   R        06/12/24 1927   06/12/24 1933  HIV Antibody (routine testing w rflx)  (HIV Antibody (Routine testing w reflex) panel)  Add-on,   AD        06/12/24 1932            Vitals/Pain Today's Vitals   06/12/24 1845 06/12/24 1847 06/12/24 1850 06/12/24 2000  BP:    (!) 143/101  Pulse:    (!) 101  Resp:    19  Temp:      SpO2: (!) 88% 100%  100%  Weight:      Height:      PainSc:   0-No pain     Isolation Precautions No active isolations  Medications Medications  atorvastatin  (LIPITOR ) tablet 80 mg (80 mg Oral Given 06/12/24 2006)  ezetimibe  (ZETIA )  tablet 10 mg (10 mg Oral Given 06/12/24 2006)  losartan  (COZAAR ) tablet 25 mg (25 mg Oral Given 06/12/24 2006)  empagliflozin  (JARDIANCE ) tablet 10 mg (10 mg Oral Given 06/12/24 2006)  levETIRAcetam  (KEPPRA ) tablet 500 mg (has no administration in time range)  furosemide  (LASIX ) injection 40 mg  (has no administration in time range)  enoxaparin  (LOVENOX ) injection 40 mg (40 mg Subcutaneous Given 06/12/24 2006)  sodium chloride  flush (NS) 0.9 % injection 3 mL (has no administration in time range)  sodium chloride  flush (NS) 0.9 % injection 3 mL (has no administration in time range)  0.9 %  sodium chloride  infusion (has no administration in time range)  acetaminophen  (TYLENOL ) tablet 650 mg (has no administration in time range)    Or  acetaminophen  (TYLENOL ) suppository 650 mg (has no administration in time range)  trimethobenzamide (TIGAN) injection 200 mg (has no administration in time range)  potassium chloride  SA (KLOR-CON  M) CR tablet 20 mEq (20 mEq Oral Given 06/12/24 2006)  metoprolol  succinate (TOPROL -XL) 24 hr tablet 12.5 mg (12.5 mg Oral Given 06/12/24 2006)  furosemide  (LASIX ) injection 60 mg (60 mg Intravenous Given 06/12/24 1905)  aspirin  EC tablet 81 mg (81 mg Oral Given 06/12/24 2006)    Mobility walks     Focused Assessments Pulmonary Assessment Handoff:   O2 Flow Rate (L/min): 2 L/min    R Recommendations: See Admitting Provider Note  Report given to:   Additional Notes: A&O / uses the urinal

## 2024-06-12 NOTE — Telephone Encounter (Signed)
 EMS was dispatched to [name] [id number].

## 2024-06-12 NOTE — Telephone Encounter (Signed)
 Copied from CRM [id number]. Topic: Clinical - Red Word Triage >> Jun 12, 2024  9:58 AM Zipporah Him wrote: Red Word that prompted transfer to Nurse Triage: Caller states patient is very swollen, he has been out of meds for some time, patient was not with the caller. Advised that triage could call the patient, patients phone is not working, please call his girlfriends number at [phone].

## 2024-06-12 NOTE — ED Notes (Signed)
 Called and placed PT on monitor with CCMD.

## 2024-06-12 NOTE — Telephone Encounter (Signed)
 FYI Only or Action Required?: FYI only for provider.  Patient was last seen in primary care on 11/08/2022 by Hassie Lint, PA-C. Called Nurse Triage reporting Leg Swelling. Symptoms began a week ago. Interventions attempted: Prescription medications: Lasix . Symptoms are: gradually worsening.  Triage Disposition: Go to ED Now (Notify PCP)  **Pt. Agrees with ED dispositon  Patient/caregiver understands and will follow disposition?: Yes Reason for Disposition  Difficulty breathing at rest  Answer Assessment - Initial Assessment Questions 1. ONSET: When did the swelling start? (e.g., minutes, hours, days)     X 1 week  2. LOCATION: What part of the leg is swollen?  Are both legs swollen or just one leg?     BIL Legs, BIL, Feet, Face, he states his whole body  3. SEVERITY: How bad is the swelling? (e.g., localized; mild, moderate, severe)   - Localized: Small area of swelling localized to one leg.   - MILD pedal edema: Swelling limited to foot and ankle, pitting edema < 1/4 inch (6 mm) deep, rest and elevation eliminate most or all swelling.   - MODERATE edema: Swelling of lower leg to knee, pitting edema > 1/4 inch (6 mm) deep, rest and elevation only partially reduce swelling.   - SEVERE edema: Swelling extends above knee, facial or hand swelling present.      Severe, getting worse  4. REDNESS: Does the swelling look red or infected?     Yes  5. PAIN: Is the swelling painful to touch? If Yes, ask: How painful is it?   (Scale 1-10; mild, moderate or severe)     Yes  6. FEVER: Do you have a fever? If Yes, ask: What is it, how was it measured, and when did it start?      No  7. CAUSE: What do you think is causing the leg swelling?     Unknown   8. MEDICAL HISTORY: Do you have a history of blood clots (e.g., DVT), cancer, heart failure, kidney disease, or liver failure?     CHF, defibrillator, triple bypass   9. RECURRENT SYMPTOM: Have you had leg swelling  before? If Yes, ask: When was the last time? What happened that time?     He states a year ago   10. OTHER SYMPTOMS: Do you have any other symptoms? (e.g., chest pain, difficulty breathing)       SOB x 1 week   Patient is having SOB at rest, full body swelling which patient states is severe and red. ED disposition given. He will head to the ED now.  Protocols used: Leg Swelling and Edema-A-AH

## 2024-06-12 NOTE — ED Provider Notes (Signed)
 [place] EMERGENCY DEPARTMENT AT [facility] Provider Note   CSN: [id number] Arrival date & time: 06/12/24  1123     Patient presents with: Shortness of Breath   [name] is a 63 y.o. male.   Pt with hx CM, CHF, c/o increased sob, bil leg edema, orthopnea/pnd, in past few weeks. States ~ 1 month ago ran out of all of his meds - a friend was giving him some 'fluid pills', which he has been taking prn, but now that has run out and his sob is getting worse. Denies chest pain or discomfort. No cough or uri symptoms. No fever or chills.   The history is provided by the patient and medical records.  Shortness of Breath Associated symptoms: no abdominal pain, no chest pain, no cough, no fever, no headaches, no neck pain, no sore throat and no vomiting        Prior to Admission medications   Medication Sig Start Date End Date Taking? Authorizing Provider  ASPIRIN  LOW DOSE 81 MG tablet TAKE 1 TABLET (81 MG TOTAL) BY MOUTH DAILY. SWALLOW WHOLE. (AM) 10/05/23   Milford, Arlice Bene, FNP  atorvastatin  (LIPITOR ) 80 MG tablet TAKE 1 TABLET (80 MG TOTAL) BY MOUTH DAILY. (MORNING) 10/05/23   Milford, Arlice Bene, FNP  ezetimibe  (ZETIA ) 10 MG tablet Take 1 tablet (10 mg total) by mouth daily. 01/14/24 04/13/24  Sheryl Donna, NP  JARDIANCE  10 MG TABS tablet TAKE 1 TABLET (10 MG TOTAL) BY MOUTH DAILY. (AM) 10/05/23   Milford, Arlice Bene, FNP  levETIRAcetam  (KEPPRA ) 500 MG tablet TAKE 1 TABLET BY MOUTH 2 TIMES DAILY 01/04/21   Glory Larsen, MD  losartan  (COZAAR ) 25 MG tablet TAKE 1 TABLET (25 MG TOTAL) BY MOUTH DAILY(AM) 12/14/23   Milford, Arlice Bene, FNP  metoprolol  succinate (TOPROL -XL) 25 MG 24 hr tablet TAKE 1/2 TABLET (12.5 MG TOTAL) BY MOUTH DAILY (AM) 10/05/23   Arleene Belt, PA-C  potassium chloride  SA (KLOR-CON  M20) 20 MEQ tablet Take 5 tablets (100 mEq total) by mouth daily. 01/14/24   Sheryl Donna, NP  torsemide  (DEMADEX ) 100 MG tablet Take 1 tablet (100 mg total)  by mouth daily. 01/16/24   Sheryl Donna, NP    Allergies: Aldactone  [spironolactone ]    Review of Systems  Constitutional:  Negative for chills and fever.  HENT:  Negative for sore throat.   Respiratory:  Positive for shortness of breath. Negative for cough.   Cardiovascular:  Negative for chest pain.  Gastrointestinal:  Negative for abdominal pain and vomiting.  Genitourinary:  Negative for flank pain.  Musculoskeletal:  Negative for back pain and neck pain.  Neurological:  Negative for headaches.    Updated Vital Signs BP (!) 146/112 (BP Location: Left Arm)   Pulse (!) 54   Temp 97.7 F (36.5 C)   Resp 20   Ht 1.803 m (5' 11)   Wt 68 kg   SpO2 100%   BMI 20.92 kg/m   Physical Exam Vitals and nursing note reviewed.  Constitutional:      Appearance: Normal appearance. He is well-developed.  HENT:     Head: Atraumatic.     Nose: Nose normal.     Mouth/Throat:     Mouth: Mucous membranes are moist.   Eyes:     General: No scleral icterus.    Conjunctiva/sclera: Conjunctivae normal.   Neck:     Trachea: No tracheal deviation.   Cardiovascular:     Rate  and Rhythm: Normal rate and regular rhythm.     Pulses: Normal pulses.     Heart sounds: Normal heart sounds. No murmur heard.    No friction rub. No gallop.  Pulmonary:     Effort: Pulmonary effort is normal. No accessory muscle usage or respiratory distress.     Breath sounds: Normal breath sounds.     Comments: Rales, bases.  Abdominal:     General: Bowel sounds are normal. There is no distension.     Palpations: Abdomen is soft.     Tenderness: There is no abdominal tenderness.   Musculoskeletal:     Cervical back: Normal range of motion and neck supple. No rigidity.     Comments: Bilateral leg edema to thighs.    Skin:    General: Skin is warm and dry.     Findings: No rash.   Neurological:     Mental Status: He is alert.     Comments: Alert, speech clear.   Psychiatric:        Mood and Affect:  Mood normal.     (all labs ordered are listed, but only abnormal results are displayed) Results for orders placed or performed during the hospital encounter of 06/12/24  CBC with Differential/Platelet   Collection Time: 06/12/24 11:52 AM  Result Value Ref Range   WBC 6.6 4.0 - 10.5 K/uL   RBC 4.02 (L) 4.22 - 5.81 MIL/uL   Hemoglobin 12.4 (L) 13.0 - 17.0 g/dL   HCT 78.2 95.6 - 21.3 %   MCV 98.3 80.0 - 100.0 fL   MCH 30.8 26.0 - 34.0 pg   MCHC 31.4 30.0 - 36.0 g/dL   RDW 08.6 57.8 - 46.9 %   Platelets 238 150 - 400 K/uL   nRBC 0.0 0.0 - 0.2 %   Neutrophils Relative % 55 %   Neutro Abs 3.7 1.7 - 7.7 K/uL   Lymphocytes Relative 23 %   Lymphs Abs 1.5 0.7 - 4.0 K/uL   Monocytes Relative 16 %   Monocytes Absolute 1.1 (H) 0.1 - 1.0 K/uL   Eosinophils Relative 4 %   Eosinophils Absolute 0.2 0.0 - 0.5 K/uL   Basophils Relative 1 %   Basophils Absolute 0.1 0.0 - 0.1 K/uL   Immature Granulocytes 1 %   Abs Immature Granulocytes 0.03 0.00 - 0.07 K/uL  Brain natriuretic peptide   Collection Time: 06/12/24 11:52 AM  Result Value Ref Range   B Natriuretic Peptide 762.9 (H) 0.0 - 100.0 pg/mL  Comprehensive metabolic panel with GFR   Collection Time: 06/12/24 11:52 AM  Result Value Ref Range   Sodium 136 135 - 145 mmol/L   Potassium 3.6 3.5 - 5.1 mmol/L   Chloride 106 98 - 111 mmol/L   CO2 23 22 - 32 mmol/L   Glucose, Bld 97 70 - 99 mg/dL   BUN 6 (L) 8 - 23 mg/dL   Creatinine, Ser 6.29 0.61 - 1.24 mg/dL   Calcium  8.4 (L) 8.9 - 10.3 mg/dL   Total Protein 6.4 (L) 6.5 - 8.1 g/dL   Albumin  3.2 (L) 3.5 - 5.0 g/dL   AST 32 15 - 41 U/L   ALT 22 0 - 44 U/L   Alkaline Phosphatase 76 38 - 126 U/L   Total Bilirubin 0.9 0.0 - 1.2 mg/dL   GFR, Estimated >52 >84 mL/min   Anion gap 7 5 - 15  Troponin I (High Sensitivity)   Collection Time: 06/12/24 11:52 AM  Result Value Ref  Range   Troponin I (High Sensitivity) 18 (H) <18 ng/L  Troponin I (High Sensitivity)   Collection Time: 06/12/24  2:00  PM  Result Value Ref Range   Troponin I (High Sensitivity) 19 (H) <18 ng/L   DG Chest 2 View Result Date: 06/12/2024 CLINICAL DATA:  sob, leg swelling EXAM: CHEST - 2 VIEW COMPARISON:  January 16, 2023 FINDINGS: Central pulmonary vascular congestion. Elevation of the left hemidiaphragm with streaky left basilar atelectasis. No focal airspace consolidation, pleural effusion, or pneumothorax. Unchanged cardiomegaly. Left chest pacemaker/AICD with a single lead terminating in the right ventricle. CABG markers and sternotomy wires. No acute fracture or destructive lesion. IMPRESSION: Unchanged cardiomegaly with mild central pulmonary vascular congestion. No overt pulmonary edema or pleural effusions. Electronically Signed   By: Rance Burrows M.D.   On: 06/12/2024 13:33     EKG: EKG Interpretation Date/Time:  Thursday June 12 2024 11:31:46 EDT Ventricular Rate:  95 PR Interval:  122 QRS Duration:  160 QT Interval:  420 QTC Calculation: 527 R Axis:   90  Text Interpretation: Sinus rhythm with frequent Premature ventricular complexes Left bundle branch block Non-specific ST-t changes Confirmed by Guadalupe Lee ([id number]) on 06/12/2024 7:04:12 PM  Radiology: Lenell Query Chest 2 View Result Date: 06/12/2024 CLINICAL DATA:  sob, leg swelling EXAM: CHEST - 2 VIEW COMPARISON:  January 16, 2023 FINDINGS: Central pulmonary vascular congestion. Elevation of the left hemidiaphragm with streaky left basilar atelectasis. No focal airspace consolidation, pleural effusion, or pneumothorax. Unchanged cardiomegaly. Left chest pacemaker/AICD with a single lead terminating in the right ventricle. CABG markers and sternotomy wires. No acute fracture or destructive lesion. IMPRESSION: Unchanged cardiomegaly with mild central pulmonary vascular congestion. No overt pulmonary edema or pleural effusions. Electronically Signed   By: Rance Burrows M.D.   On: 06/12/2024 13:33     Procedures   Medications Ordered in the ED   furosemide  (LASIX ) injection 60 mg (has no administration in time range)                                    Medical Decision Making Problems Addressed: Acute on chronic combined systolic and diastolic CHF (congestive heart failure) (HCC): acute illness or injury with systemic symptoms that poses a threat to life or bodily functions Acute respiratory failure, unspecified whether with hypoxia or hypercapnia (HCC): acute illness or injury with systemic symptoms that poses a threat to life or bodily functions Non compliance w medication regimen: chronic illness or injury Other cardiomyopathy (HCC): chronic illness or injury with exacerbation, progression, or side effects of treatment that poses a threat to life or bodily functions  Amount and/or Complexity of Data Reviewed External Data Reviewed: notes. Labs: ordered. Decision-making details documented in ED Course. Radiology: ordered and independent interpretation performed. Decision-making details documented in ED Course. ECG/medicine tests: ordered and independent interpretation performed. Decision-making details documented in ED Course. Discussion of management or test interpretation with external provider(s): Medicine  Risk Prescription drug management. Decision regarding hospitalization.   Iv ns. Continuous pulse ox and cardiac monitoring. Labs ordered/sent. Imaging ordered.   Differential diagnosis includes chf, pna, etc. Dispo decision including potential need for admission considered - will get labs and imaging and reassess.   Reviewed nursing notes and prior charts for additional history. External reports reviewed. Prior echo - very low EF ?20%.   Cardiac monitor: sinus rhythm, rate 60.  Lasix  iv.   Labs reviewed/interpreted by  me - wbc and hct normal. Bnp is elevated. Trop sl elev but delta trop flat, no chest pain, feel strain, related to chf/fluid overload. Chem unremarkable.   Xrays reviewed/interpreted by me -  vascular congestion/chf.   Medicine consulted for admission.   CRITICAL CARE RE: acute on chronic combined chf. Dyspnea/fluid overload.  Performed by: Altheia Shafran E Kayloni Rocco Total critical care time: 45 minutes Critical care time was exclusive of separately billable procedures and treating other patients. Critical care was necessary to treat or prevent imminent or life-threatening deterioration. Critical care was time spent personally by me on the following activities: development of treatment plan with patient and/or surrogate as well as nursing, discussions with consultants, evaluation of patient's response to treatment, examination of patient, obtaining history from patient or surrogate, ordering and performing treatments and interventions, ordering and review of laboratory studies, ordering and review of radiographic studies, pulse oximetry and re-evaluation of patient's condition.       Final diagnoses:  Acute on chronic combined systolic and diastolic CHF (congestive heart failure) (HCC)  Other cardiomyopathy (HCC)  Acute respiratory failure, unspecified whether with hypoxia or hypercapnia (HCC)  Non compliance w medication regimen    ED Discharge Orders     None          Guadalupe Lee, MD 06/12/24 1904

## 2024-06-12 NOTE — ED Provider Triage Note (Signed)
 Emergency Medicine Provider Triage Evaluation Note  [name] , a 63 y.o. male  was evaluated in triage.  Pt complains of shortness of breath, mild productive cough and distal edema that is been progressive over the last 1 month.  He has been out of his fluid medication for approximately a month.  Now short of breath even at rest.  Unknown weight gain  Review of Systems  Positive: Shortness of breath, cough, leg swelling Negative: Fever, chest pain  Physical Exam  BP (!) 136/94 (BP Location: Right Arm)   Pulse 94   Temp 98.2 F (36.8 C)   Resp 18   Ht 5' 11 (1.803 m)   Wt 68 kg   SpO2 98%   BMI 20.92 kg/m  Gen:   Awake, no distress   Resp:  Normal effort, without wheezing but rales in lower lobes MSK:   Moves extremities without difficulty, 1-2+ pitting edema in feet and tib-fib area Other:  Regular rate and rhythm on cardiac exam  Medical Decision Making  Medically screening exam initiated at 11:50 AM.  Appropriate orders placed.  Bazil Dhanani was informed that the remainder of the evaluation will be completed by another provider, this initial triage assessment does not replace that evaluation, and the importance of remaining in the ED until their evaluation is complete.     Almond Army, MD 06/12/24 1151

## 2024-06-12 NOTE — Telephone Encounter (Signed)
 Transportation Voucher completed.   Pick up date and time: 07/14/2024 at 2:00 pm. Pick up address: [name], [address] [name] [address] Drop off address: [address] [place], [address]

## 2024-06-12 NOTE — TOC CM/SW Note (Signed)
 SW attached substance resources to patient's AVS.   .Winfield Hau, MSW, LCSWA Transition of Care  Clinical Social Worker (ED 3-11 Mon-Fri)  [phone]

## 2024-06-12 NOTE — Telephone Encounter (Signed)
 Noted

## 2024-06-13 ENCOUNTER — Inpatient Hospital Stay (HOSPITAL_COMMUNITY)

## 2024-06-13 DIAGNOSIS — I5043 Acute on chronic combined systolic (congestive) and diastolic (congestive) heart failure: Secondary | ICD-10-CM | POA: Diagnosis not present

## 2024-06-13 DIAGNOSIS — I5023 Acute on chronic systolic (congestive) heart failure: Secondary | ICD-10-CM | POA: Diagnosis not present

## 2024-06-13 LAB — COMPREHENSIVE METABOLIC PANEL WITH GFR
ALT: 20 U/L (ref 0–44)
AST: 27 U/L (ref 15–41)
Albumin: 3.3 g/dL — ABNORMAL LOW (ref 3.5–5.0)
Alkaline Phosphatase: 80 U/L (ref 38–126)
Anion gap: 7 (ref 5–15)
BUN: 6 mg/dL — ABNORMAL LOW (ref 8–23)
CO2: 26 mmol/L (ref 22–32)
Calcium: 8.3 mg/dL — ABNORMAL LOW (ref 8.9–10.3)
Chloride: 106 mmol/L (ref 98–111)
Creatinine, Ser: 1.05 mg/dL (ref 0.61–1.24)
GFR, Estimated: >60 mL/min (ref >60)
Glucose, Bld: 137 mg/dL — ABNORMAL HIGH (ref 70–99)
Potassium: 3.3 mmol/L — ABNORMAL LOW (ref 3.5–5.1)
Sodium: 139 mmol/L (ref 135–145)
Total Bilirubin: 2.1 mg/dL — ABNORMAL HIGH (ref 0.0–1.2)
Total Protein: 6.5 g/dL (ref 6.5–8.1)

## 2024-06-13 LAB — CBC
HCT: 42 % (ref 39.0–52.0)
Hemoglobin: 13.4 g/dL (ref 13.0–17.0)
MCH: 30.6 pg (ref 26.0–34.0)
MCHC: 31.9 g/dL (ref 30.0–36.0)
MCV: 95.9 fL (ref 80.0–100.0)
Platelets: 234 10*3/uL (ref 150–400)
RBC: 4.38 MIL/uL (ref 4.22–5.81)
RDW: 15.4 % (ref 11.5–15.5)
WBC: 9 10*3/uL (ref 4.0–10.5)
nRBC: 0 % (ref 0.0–0.2)

## 2024-06-13 LAB — ECHOCARDIOGRAM COMPLETE
Area-P 1/2: 4.18 cm2
Calc EF: 16.5 %
Est EF: <20
Height: 71 in
S' Lateral: 4.9 cm
Single Plane A2C EF: 12.2 %
Single Plane A4C EF: 20.5 %
Weight: 2473.6 [oz_av]

## 2024-06-13 LAB — HIV ANTIBODY (ROUTINE TESTING W REFLEX): HIV Screen 4th Generation wRfx: NONREACTIVE

## 2024-06-13 MED ORDER — LORAZEPAM 1 MG PO TABS: 1.0000 mg | ORAL_TABLET | ORAL | Status: DC | PRN

## 2024-06-13 MED ORDER — POTASSIUM CHLORIDE CRYS ER 20 MEQ PO TBCR
40.0000 meq | EXTENDED_RELEASE_TABLET | Freq: Two times a day (BID) | ORAL | Status: AC
  Administered 2024-06-13 (×2): 40 meq via ORAL
  Filled 2024-06-13 (×2): qty 2

## 2024-06-13 MED ORDER — ADULT MULTIVITAMIN W/MINERALS CH
1.0000 | ORAL_TABLET | Freq: Every day | ORAL | Status: DC
  Administered 2024-06-13 – 2024-06-16 (×4): 1 via ORAL
  Filled 2024-06-13 (×4): qty 1

## 2024-06-13 MED ORDER — SODIUM CHLORIDE 0.9% FLUSH
3.0000 mL | Freq: Two times a day (BID) | INTRAVENOUS | Status: DC
  Administered 2024-06-13 – 2024-06-16 (×6): 3 mL via INTRAVENOUS

## 2024-06-13 MED ORDER — THIAMINE HCL 100 MG/ML IJ SOLN: 100.0000 mg | Freq: Every day | INTRAMUSCULAR | Status: DC

## 2024-06-13 MED ORDER — FOLIC ACID 1 MG PO TABS
1.0000 mg | ORAL_TABLET | Freq: Every day | ORAL | Status: DC
  Administered 2024-06-13 – 2024-06-16 (×4): 1 mg via ORAL
  Filled 2024-06-13 (×4): qty 1

## 2024-06-13 MED ORDER — LORAZEPAM 2 MG/ML IJ SOLN: 1.0000 mg | INTRAMUSCULAR | Status: DC | PRN

## 2024-06-13 MED ORDER — THIAMINE MONONITRATE 100 MG PO TABS
100.0000 mg | ORAL_TABLET | Freq: Every day | ORAL | Status: DC
  Administered 2024-06-13 – 2024-06-16 (×4): 100 mg via ORAL
  Filled 2024-06-13 (×4): qty 1

## 2024-06-13 MED ORDER — METHOCARBAMOL 500 MG PO TABS
500.0000 mg | ORAL_TABLET | Freq: Once | ORAL | Status: AC
  Administered 2024-06-13: 500 mg via ORAL
  Filled 2024-06-13: qty 1

## 2024-06-13 MED ORDER — SODIUM CHLORIDE 0.9% FLUSH
3.0000 mL | INTRAVENOUS | Status: DC | PRN
  Administered 2024-06-15: 3 mL via INTRAVENOUS

## 2024-06-13 MED ORDER — PERFLUTREN LIPID MICROSPHERE
1.0000 mL | INTRAVENOUS | Status: AC | PRN
  Administered 2024-06-13: 2 mL via INTRAVENOUS

## 2024-06-13 MED ORDER — SODIUM CHLORIDE 0.9 % IV SOLN: 250.0000 mL | INTRAVENOUS | Status: AC | PRN

## 2024-06-13 NOTE — Progress Notes (Addendum)
 PROGRESS NOTE    [name]  UEA:[id number] DOB: [birth year] DOA: 06/12/2024 PCP: Collins Dean, NP  63/M with chronic systolic CHF, V-fib arrest in 4782, CAD/CABG, EtOH use, seizure disorder presented to the ED with dyspnea orthopnea and lower extremity edema for few weeks, allegedly ran out of meds a month ago. - In the ED mildly hypoxic, troponin 18, 19, EKG with LBBB, PVCs, BNP 762, albumin  3.2, chest x-ray with cardiomegaly and pulmonary vascular congestion   Subjective: -Feels weak and tired  Assessment and Plan:  Acute on chronic systolic and diastolic CHF -Last echo 10/23 with EF less than 20%, grade 2 DD, severely reduced RV -Long history of poor compliance and alcoholism, allegedly out of meds for 1 month -Continue Lasix  40 Mg twice daily today, losartan , Jardiance  -Hold Toprol  -Follow-up repeat echo - Not able to have para medicine, will add home health RN services at DC  History of CAD status post CABG - Continue aspirin , ezetimibe  and Lipitor  -Hold Toprol  at this time   Prolonged QT interval Continue to monitor EKG for further prolongation of QTc.  Potassium within normal range.  Monitor electrolytes.   Chronic hypokalemia - Replete   Medication noncompliance -Counseled   Seizure disorder - Suspect this may be due to alcoholism,  continue Keppra    Chronic alcohol use - Patient reported he has been drinking 3 to 4 cans of beer on basis and last drink 6/18 - Monitor on CIWA folic acid , thiamine  and multivitamin. - Counseled at bedside for cessation of alcohol use.   DVT prophylaxis:  Lovenox  Code Status:  Full Code Family Communication: Friend at bedside Disposition Plan: Home likely 48 hours  Antimicrobials:    Objective: Vitals:   06/13/24 0104 06/13/24 0334 06/13/24 0830 06/13/24 1152  BP: (!) 139/94 133/81 128/84 113/73  Pulse: 93 92 82 70  Resp: 18 18  18   Temp:  98.6 F (37 C) 97.7 F (36.5 C) 97.6 F (36.4 C)  TempSrc: Oral Oral  Oral Oral  SpO2: 100% 98% 100% 99%  Weight: 70.1 kg     Height:        Intake/Output Summary (Last 24 hours) at 06/13/2024 1153 Last data filed at 06/13/2024 1152 Gross per 24 hour  Intake --  Output 4600 ml  Net -4600 ml   Filed Weights   06/12/24 1126 06/13/24 0104  Weight: 68 kg 70.1 kg    Examination:  General exam: Appears calm and comfortable chronically ill, disheveled Respiratory system: Decreased breath sounds at the bases Cardiovascular system: S1 & S2 heard, RRR.  Abd: nondistended, soft and nontender.Normal bowel sounds heard. Central nervous system: Alert and oriented. No focal neurological deficits. Extremities: 1 plus edema Skin: No rashes Psychiatry: Flat affect    Data Reviewed:   CBC: Recent Labs  Lab 06/12/24 1152 06/13/24 0416  WBC 6.6 9.0  NEUTROABS 3.7  --   HGB 12.4* 13.4  HCT 39.5 42.0  MCV 98.3 95.9  PLT 238 234   Basic Metabolic Panel: Recent Labs  Lab 06/12/24 1152 06/13/24 0416  NA 136 139  K 3.6 3.3*  CL 106 106  CO2 23 26  GLUCOSE 97 137*  BUN 6* 6*  CREATININE 1.08 1.05  CALCIUM  8.4* 8.3*   GFR: Estimated Creatinine Clearance: 73.3 mL/min (by C-G formula based on SCr of 1.05 mg/dL). Liver Function Tests: Recent Labs  Lab 06/12/24 1152 06/13/24 0416  AST 32 27  ALT 22 20  ALKPHOS 76 80  BILITOT 0.9  2.1*  PROT 6.4* 6.5  ALBUMIN  3.2* 3.3*   No results for input(s): LIPASE, AMYLASE in the last 168 hours. No results for input(s): AMMONIA in the last 168 hours. Coagulation Profile: No results for input(s): INR, PROTIME in the last 168 hours. Cardiac Enzymes: No results for input(s): CKTOTAL, CKMB, CKMBINDEX, TROPONINI in the last 168 hours. BNP (last 3 results) No results for input(s): PROBNP in the last 8760 hours. HbA1C: No results for input(s): HGBA1C in the last 72 hours. CBG: No results for input(s): GLUCAP in the last 168 hours. Lipid Profile: No results for input(s): CHOL,  HDL, LDLCALC, TRIG, CHOLHDL, LDLDIRECT in the last 72 hours. Thyroid  Function Tests: No results for input(s): TSH, T4TOTAL, FREET4, T3FREE, THYROIDAB in the last 72 hours. Anemia Panel: No results for input(s): VITAMINB12, FOLATE, FERRITIN, TIBC, IRON, RETICCTPCT in the last 72 hours. Urine analysis:    Component Value Date/Time   COLORURINE COLORLESS (A) 01/08/2020 1922   APPEARANCEUR CLEAR 01/08/2020 1922   LABSPEC 1.002 (L) 01/08/2020 1922   PHURINE 7.0 01/08/2020 1922   GLUCOSEU NEGATIVE 01/08/2020 1922   HGBUR NEGATIVE 01/08/2020 1922   BILIRUBINUR NEGATIVE 01/08/2020 1922   KETONESUR NEGATIVE 01/08/2020 1922   PROTEINUR NEGATIVE 01/08/2020 1922   NITRITE NEGATIVE 01/08/2020 1922   LEUKOCYTESUR NEGATIVE 01/08/2020 1922   Sepsis Labs: @LABRCNTIP (procalcitonin:4,lacticidven:4)  )No results found for this or any previous visit (from the past 240 hours).   Radiology Studies: DG Chest 2 View Result Date: 06/12/2024 CLINICAL DATA:  sob, leg swelling EXAM: CHEST - 2 VIEW COMPARISON:  January 16, 2023 FINDINGS: Central pulmonary vascular congestion. Elevation of the left hemidiaphragm with streaky left basilar atelectasis. No focal airspace consolidation, pleural effusion, or pneumothorax. Unchanged cardiomegaly. Left chest pacemaker/AICD with a single lead terminating in the right ventricle. CABG markers and sternotomy wires. No acute fracture or destructive lesion. IMPRESSION: Unchanged cardiomegaly with mild central pulmonary vascular congestion. No overt pulmonary edema or pleural effusions. Electronically Signed   By: Rance Burrows M.D.   On: 06/12/2024 13:33     Scheduled Meds:  atorvastatin   80 mg Oral Daily   empagliflozin   10 mg Oral Daily   enoxaparin  (LOVENOX ) injection  40 mg Subcutaneous Q24H   ezetimibe   10 mg Oral Daily   folic acid   1 mg Oral Daily   furosemide   40 mg Intravenous BID   levETIRAcetam   500 mg Oral BID   losartan   25  mg Oral Daily   methocarbamol  500 mg Oral Once   metoprolol  succinate  12.5 mg Oral Daily   multivitamin with minerals  1 tablet Oral Daily   potassium chloride  SA  40 mEq Oral BID   sodium chloride  flush  3 mL Intravenous Q12H   sodium chloride  flush  3 mL Intravenous Q12H   thiamine   100 mg Oral Daily   Or   thiamine   100 mg Intravenous Daily   Continuous Infusions:  sodium chloride      sodium chloride        LOS: 1 day    Time spent:    Deforest Fast, MD [facility]   06/13/2024, 11:53 AM

## 2024-06-13 NOTE — Plan of Care (Signed)

## 2024-06-13 NOTE — Care Management (Signed)
 Transition of Care [facility]) - Inpatient Brief Assessment   Patient Details  Name: [name] MRN: [id number] Date of Birth: [birth year]  [facility] CM/SW Contact:    Ronni Colace, RN Phone Number: 06/13/2024, 12:20 PM   Clinical Narrative:  63 year old patient presented with dyspnea lower extremity edema has a history of heart failure, ran out of medications a month ago. EF< 20% Did not follow up with heart failure clinic.SA resources added by CSW. He apparently was not a candidate for paramedic services, Not likely that we could get nursing home health for him due to insurance, and SA.   TOC will follow for assistance and discharge planning  Transition of Care Asessment: Insurance and Status: Insurance coverage has been reviewed Patient has primary care physician: Yes Home environment has been reviewed: Lives in apartment Prior level of function:: independent Prior/Current Home Services: No current home services Social Drivers of Health Review: SDOH reviewed interventions complete Readmission risk has been reviewed: Yes Transition of care needs: transition of care needs identified, TOC will continue to follow

## 2024-06-13 NOTE — Progress Notes (Signed)
 Heart Failure Navigator Progress Note  Assessed for Heart & Vascular TOC clinic readiness.  Patient does not meet criteria due to Advanced Heart Failure team patient of Dr. Gala Romney.   Navigator will sign off at this time.    Rhae Hammock, BSN, Scientist, clinical (histocompatibility and immunogenetics) Only

## 2024-06-14 DIAGNOSIS — I5023 Acute on chronic systolic (congestive) heart failure: Secondary | ICD-10-CM | POA: Diagnosis not present

## 2024-06-14 LAB — MAGNESIUM: Magnesium: 1.9 mg/dL (ref 1.7–2.4)

## 2024-06-14 LAB — BASIC METABOLIC PANEL WITH GFR
Anion gap: 13 (ref 5–15)
BUN: 8 mg/dL (ref 8–23)
CO2: 28 mmol/L (ref 22–32)
Calcium: 8.7 mg/dL — ABNORMAL LOW (ref 8.9–10.3)
Chloride: 98 mmol/L (ref 98–111)
Creatinine, Ser: 1.12 mg/dL (ref 0.61–1.24)
GFR, Estimated: >60 mL/min (ref >60)
Glucose, Bld: 90 mg/dL (ref 70–99)
Potassium: 3.9 mmol/L (ref 3.5–5.1)
Sodium: 139 mmol/L (ref 135–145)

## 2024-06-14 NOTE — Plan of Care (Signed)

## 2024-06-14 NOTE — Progress Notes (Signed)
 PROGRESS NOTE    [name]  FMW:[id number] DOB: [birth year] DOA: 06/12/2024 PCP: Theotis Haze ORN, NP  63/M with chronic systolic CHF, V-fib arrest in 7981, CAD/CABG, EtOH use, seizure disorder presented to the ED with dyspnea orthopnea and lower extremity edema for few weeks, allegedly ran out of meds a month ago. - In the ED mildly hypoxic, troponin 18, 19, EKG with LBBB, PVCs, BNP 762, albumin  3.2, chest x-ray with cardiomegaly and pulmonary vascular congestion   Subjective: -Feels weak and tired  Assessment and Plan:  Acute on chronic systolic and diastolic CHF -Last echo 10/23 with EF less than 20%, grade 2 DD, severely reduced RV -Long history of poor compliance and alcoholism, allegedly out of meds for 1 month - Improving with diuresis, 6.8 L negative, continue Lasix  40 Mg twice daily today, losartan , Jardiance , BMP pending this morning -Hold Toprol  - Repeat echo with EF less than 20%, moderately reduced RV - Unfortunately seems poorly motivated to change his habits, unable to get para medicine services, will add home health RN services at [place]  History of CAD status post CABG - Continue aspirin , ezetimibe  and Lipitor  -Hold Toprol  at this time   Prolonged QT interval Continue to monitor EKG for further prolongation of QTc.  Potassium within normal range.  Monitor electrolytes.   Chronic hypokalemia - Replete   Medication noncompliance -Counseled   Seizure disorder - Suspect this may be due to alcoholism,  continue Keppra    Chronic alcohol use - Patient reported he has been drinking 3 to 4 cans of beer on basis and last drink 6/18 - Monitor on CIWA folic acid , thiamine  and multivitamin. - Counseled at bedside for cessation of alcohol use.   DVT prophylaxis:  Lovenox  Code Status:  Full Code Family Communication: Friend at bedside Disposition Plan: Home likely 48 hours  Antimicrobials:    Objective: Vitals:   06/13/24 1953 06/14/24 0039 06/14/24 0500  06/14/24 0814  BP: 111/70 111/80 (!) 99/56 111/72  Pulse: 65 75  77  Resp: 18 18 16 18   Temp: 97.8 F (36.6 C) 98.2 F (36.8 C) 98.2 F (36.8 C) 98.2 F (36.8 C)  TempSrc: Oral Oral Oral Oral  SpO2: 100% 97%  97%  Weight:   68.6 kg   Height:        Intake/Output Summary (Last 24 hours) at 06/14/2024 1147 Last data filed at 06/14/2024 1121 Gross per 24 hour  Intake 840 ml  Output 4150 ml  Net -3310 ml   Filed Weights   06/12/24 1126 06/13/24 0104 06/14/24 0500  Weight: 68 kg 70.1 kg 68.6 kg    Examination:  General exam: AAO x 2, chronically ill-appearing, disheveled, sleepy HEENT: Positive JVD Respiratory system: Decreased breath sounds at the bases Cardiovascular system: S1 & S2 heard, RRR.  Abd: nondistended, soft and nontender.Normal bowel sounds heard. Central nervous system: Alert and oriented. No focal neurological deficits. Extremities: 1 plus edema Skin: No rashes Psychiatry: Flat affect    Data Reviewed:   CBC: Recent Labs  Lab 06/12/24 1152 06/13/24 0416  WBC 6.6 9.0  NEUTROABS 3.7  --   HGB 12.4* 13.4  HCT 39.5 42.0  MCV 98.3 95.9  PLT 238 234   Basic Metabolic Panel: Recent Labs  Lab 06/12/24 1152 06/13/24 0416  NA 136 139  K 3.6 3.3*  CL 106 106  CO2 23 26  GLUCOSE 97 137*  BUN 6* 6*  CREATININE 1.08 1.05  CALCIUM  8.4* 8.3*   GFR: Estimated Creatinine  Clearance: 71.7 mL/min (by C-G formula based on SCr of 1.05 mg/dL). Liver Function Tests: Recent Labs  Lab 06/12/24 1152 06/13/24 0416  AST 32 27  ALT 22 20  ALKPHOS 76 80  BILITOT 0.9 2.1*  PROT 6.4* 6.5  ALBUMIN  3.2* 3.3*   No results for input(s): LIPASE, AMYLASE in the last 168 hours. No results for input(s): AMMONIA in the last 168 hours. Coagulation Profile: No results for input(s): INR, PROTIME in the last 168 hours. Cardiac Enzymes: No results for input(s): CKTOTAL, CKMB, CKMBINDEX, TROPONINI in the last 168 hours. BNP (last 3 results) No  results for input(s): PROBNP in the last 8760 hours. HbA1C: No results for input(s): HGBA1C in the last 72 hours. CBG: No results for input(s): GLUCAP in the last 168 hours. Lipid Profile: No results for input(s): CHOL, HDL, LDLCALC, TRIG, CHOLHDL, LDLDIRECT in the last 72 hours. Thyroid  Function Tests: No results for input(s): TSH, T4TOTAL, FREET4, T3FREE, THYROIDAB in the last 72 hours. Anemia Panel: No results for input(s): VITAMINB12, FOLATE, FERRITIN, TIBC, IRON, RETICCTPCT in the last 72 hours. Urine analysis:    Component Value Date/Time   COLORURINE COLORLESS (A) 01/08/2020 1922   APPEARANCEUR CLEAR 01/08/2020 1922   LABSPEC 1.002 (L) 01/08/2020 1922   PHURINE 7.0 01/08/2020 1922   GLUCOSEU NEGATIVE 01/08/2020 1922   HGBUR NEGATIVE 01/08/2020 1922   BILIRUBINUR NEGATIVE 01/08/2020 1922   KETONESUR NEGATIVE 01/08/2020 1922   PROTEINUR NEGATIVE 01/08/2020 1922   NITRITE NEGATIVE 01/08/2020 1922   LEUKOCYTESUR NEGATIVE 01/08/2020 1922   Sepsis Labs: @LABRCNTIP (procalcitonin:4,lacticidven:4)  )No results found for this or any previous visit (from the past 240 hours).   Radiology Studies: ECHOCARDIOGRAM COMPLETE Result Date: 06/13/2024    ECHOCARDIOGRAM REPORT   Patient Name:   [name] [facility] Date of Exam: 06/13/2024 Medical Rec #:  [id number]           Height:       71.0 in Accession #:    [id number]          Weight:       154.6 lb Date of Birth:  1961/04/07            BSA:          1.890 m Patient Age:    63 years            BP:           133/81 mmHg Patient Gender: M                   HR:           69 bpm. Exam Location:  Inpatient Procedure: 2D Echo, Cardiac Doppler, Color Doppler and Intracardiac            Opacification Agent (Both Spectral and Color Flow Doppler were            utilized during procedure). Indications:    I50.40* Unspecified combined systolic (congestive) and diastolic                 (congestive) heart failure   History:        Patient has prior history of Echocardiogram examinations, most                 recent 09/30/2022. CHF, CAD, Prior CABG and Abnormal ECG,                 Arrythmias:Atrial Fibrillation; Risk Factors:Hypertension. ETOH.  Sonographer:    Ellouise Mose RDCS Referring  Phys: [id number] SUBRINA SUNDIL  Sonographer Comments: Technically difficult study due to poor echo windows. IMPRESSIONS  1. Left ventricular ejection fraction, by estimation, is <20%. The left ventricle has severely decreased function. The left ventricle demonstrates global hypokinesis. The left ventricular internal cavity size was severely dilated. Left ventricular diastolic parameters were normal.  2. Device leads in RA/RV . Right ventricular systolic function is moderately reduced. The right ventricular size is severely enlarged. There is mildly elevated pulmonary artery systolic pressure.  3. Left atrial size was moderately dilated.  4. Right atrial size was moderately dilated.  5. The mitral valve is abnormal. No evidence of mitral valve regurgitation. No evidence of mitral stenosis. Severe mitral annular calcification.  6. The aortic valve is tricuspid. There is moderate calcification of the aortic valve. There is moderate thickening of the aortic valve. Aortic valve regurgitation is not visualized. Aortic valve sclerosis is present, with no evidence of aortic valve stenosis.  7. The inferior vena cava is dilated in size with >50% respiratory variability, suggesting right atrial pressure of 8 mmHg. FINDINGS  Left Ventricle: Left ventricular ejection fraction, by estimation, is <20%. The left ventricle has severely decreased function. The left ventricle demonstrates global hypokinesis. Definity  contrast agent was given IV to delineate the left ventricular endocardial borders. Strain was performed and the global longitudinal strain is indeterminate. The left ventricular internal cavity size was severely dilated. There is no left ventricular  hypertrophy. Left ventricular diastolic parameters were normal. Right Ventricle: Device leads in RA/RV. The right ventricular size is severely enlarged. No increase in right ventricular wall thickness. Right ventricular systolic function is moderately reduced. There is mildly elevated pulmonary artery systolic pressure. The tricuspid regurgitant velocity is 2.68 m/s, and with an assumed right atrial pressure of 8 mmHg, the estimated right ventricular systolic pressure is 36.7 mmHg. Left Atrium: Left atrial size was moderately dilated. Right Atrium: Right atrial size was moderately dilated. Pericardium: There is no evidence of pericardial effusion. Mitral Valve: The mitral valve is abnormal. There is moderate thickening of the mitral valve leaflet(s). There is moderate calcification of the mitral valve leaflet(s). Severe mitral annular calcification. No evidence of mitral valve regurgitation. No evidence of mitral valve stenosis. Tricuspid Valve: The tricuspid valve is normal in structure. Tricuspid valve regurgitation is trivial. No evidence of tricuspid stenosis. Aortic Valve: The aortic valve is tricuspid. There is moderate calcification of the aortic valve. There is moderate thickening of the aortic valve. Aortic valve regurgitation is not visualized. Aortic valve sclerosis is present, with no evidence of aortic valve stenosis. Pulmonic Valve: The pulmonic valve was normal in structure. Pulmonic valve regurgitation is trivial. No evidence of pulmonic stenosis. Aorta: The aortic root is normal in size and structure. Venous: The inferior vena cava is dilated in size with greater than 50% respiratory variability, suggesting right atrial pressure of 8 mmHg. IAS/Shunts: No atrial level shunt detected by color flow Doppler. Additional Comments: 3D was performed not requiring image post processing on an independent workstation and was indeterminate. A device lead is visualized.  LEFT VENTRICLE PLAX 2D LVIDd:          5.70 cm      Diastology LVIDs:         4.90 cm      LV e' medial:    4.46 cm/s LV PW:         0.90 cm      LV E/e' medial:  17.2 LV IVS:  1.00 cm      LV e' lateral:   10.70 cm/s LVOT diam:     2.20 cm      LV E/e' lateral: 7.2 LV SV:         74 LV SV Index:   39 LVOT Area:     3.80 cm  LV Volumes (MOD) LV vol d, MOD A2C: 172.0 ml LV vol d, MOD A4C: 239.0 ml LV vol s, MOD A2C: 151.0 ml LV vol s, MOD A4C: 190.0 ml LV SV MOD A2C:     21.0 ml LV SV MOD A4C:     239.0 ml LV SV MOD BP:      33.6 ml RIGHT VENTRICLE            IVC RV S prime:     5.44 cm/s  IVC diam: 2.70 cm TAPSE (M-mode): 0.7 cm LEFT ATRIUM             Index        RIGHT ATRIUM           Index LA diam:        4.30 cm 2.28 cm/m   RA Area:     15.30 cm LA Vol (A2C):   62.4 ml 33.02 ml/m  RA Volume:   38.70 ml  20.48 ml/m LA Vol (A4C):   31.6 ml 16.72 ml/m LA Biplane Vol: 44.8 ml 23.70 ml/m  AORTIC VALVE LVOT Vmax:   118.50 cm/s LVOT Vmean:  76.800 cm/s LVOT VTI:    0.196 m  AORTA Ao Root diam: 3.20 cm Ao Asc diam:  3.20 cm MITRAL VALVE               TRICUSPID VALVE MV Area (PHT): 4.18 cm    TR Peak grad:   28.7 mmHg MV Decel Time: 182 msec    TR Vmax:        268.00 cm/s MV E velocity: 76.75 cm/s MV A velocity: 63.65 cm/s  SHUNTS MV E/A ratio:  1.21        Systemic VTI:  0.20 m                            Systemic Diam: 2.20 cm Maude Emmer MD Electronically signed by Maude Emmer MD Signature Date/Time: 06/13/2024/12:33:48 PM    Final    DG Chest 2 View Result Date: 06/12/2024 CLINICAL DATA:  sob, leg swelling EXAM: CHEST - 2 VIEW COMPARISON:  January 16, 2023 FINDINGS: Central pulmonary vascular congestion. Elevation of the left hemidiaphragm with streaky left basilar atelectasis. No focal airspace consolidation, pleural effusion, or pneumothorax. Unchanged cardiomegaly. Left chest pacemaker/AICD with a single lead terminating in the right ventricle. CABG markers and sternotomy wires. No acute fracture or destructive lesion. IMPRESSION:  Unchanged cardiomegaly with mild central pulmonary vascular congestion. No overt pulmonary edema or pleural effusions. Electronically Signed   By: Rogelia Myers M.D.   On: 06/12/2024 13:33     Scheduled Meds:  atorvastatin   80 mg Oral Daily   empagliflozin   10 mg Oral Daily   enoxaparin  (LOVENOX ) injection  40 mg Subcutaneous Q24H   ezetimibe   10 mg Oral Daily   folic acid   1 mg Oral Daily   furosemide   40 mg Intravenous BID   levETIRAcetam   500 mg Oral BID   losartan   25 mg Oral Daily   multivitamin with minerals  1 tablet Oral Daily   sodium chloride  flush  3 mL Intravenous Q12H   sodium chloride  flush  3 mL Intravenous Q12H   thiamine   100 mg Oral Daily   Or   thiamine   100 mg Intravenous Daily   Continuous Infusions:     LOS: 2 days    Time spent:    Sigurd Pac, MD [facility]   06/14/2024, 11:47 AM

## 2024-06-15 DIAGNOSIS — I5023 Acute on chronic systolic (congestive) heart failure: Secondary | ICD-10-CM | POA: Diagnosis not present

## 2024-06-15 LAB — BASIC METABOLIC PANEL WITH GFR
Anion gap: 9 (ref 5–15)
BUN: 13 mg/dL (ref 8–23)
CO2: 25 mmol/L (ref 22–32)
Calcium: 7.9 mg/dL — ABNORMAL LOW (ref 8.9–10.3)
Chloride: 101 mmol/L (ref 98–111)
Creatinine, Ser: 1.34 mg/dL — ABNORMAL HIGH (ref 0.61–1.24)
GFR, Estimated: >60 mL/min (ref >60)
Glucose, Bld: 105 mg/dL — ABNORMAL HIGH (ref 70–99)
Potassium: 3.6 mmol/L (ref 3.5–5.1)
Sodium: 135 mmol/L (ref 135–145)

## 2024-06-15 MED ORDER — TORSEMIDE 20 MG PO TABS
40.0000 mg | ORAL_TABLET | Freq: Every day | ORAL | Status: DC
  Administered 2024-06-15 – 2024-06-16 (×2): 40 mg via ORAL
  Filled 2024-06-15 (×2): qty 2

## 2024-06-15 MED ORDER — LOSARTAN POTASSIUM 25 MG PO TABS
25.0000 mg | ORAL_TABLET | Freq: Every day | ORAL | Status: DC
  Administered 2024-06-16: 25 mg via ORAL
  Filled 2024-06-15: qty 1

## 2024-06-15 NOTE — Plan of Care (Signed)
  Problem: Activity: Goal: Risk for activity intolerance will decrease Outcome: Progressing   Problem: Safety: Goal: Ability to remain free from injury will improve Outcome: Progressing   Problem: Education: Goal: Ability to demonstrate management of disease process will improve Outcome: Progressing Goal: Ability to verbalize understanding of medication therapies will improve Outcome: Progressing

## 2024-06-15 NOTE — Progress Notes (Signed)
 PROGRESS NOTE    [name]  FMW:[id number] DOB: June 25, 1961 DOA: 06/12/2024 PCP: Theotis Haze ORN, NP  63/M with chronic systolic CHF, V-fib arrest in 7981, CAD/CABG, EtOH use, seizure disorder presented to the ED with dyspnea orthopnea and lower extremity edema for few weeks, allegedly ran out of meds a month ago. - In the ED mildly hypoxic, troponin 18, 19, EKG with LBBB, PVCs, BNP 762, albumin  3.2, chest x-ray with cardiomegaly and pulmonary vascular congestion   Subjective: - Feels better overall, breathing improving, ambulating in the room  Assessment and Plan:  Acute on chronic systolic and diastolic CHF -Last echo 10/23 with EF less than 20%, grade 2 DD, severely reduced RV -Long history of poor compliance and alcoholism, allegedly out of meds for 1 month - Improving with diuresis, 9.3 L negative, appears euvolemic, creatinine starting to trend up, hold further IV Lasix , start oral torsemide   - Continue losartan , Jardiance  -Hold Toprol  - Repeat echo with EF less than 20%, moderately reduced RV - Unfortunately seems poorly motivated to change his habits, unable to get para medicine services, will add home health RN services at [place]  History of CAD status post CABG - Continue aspirin , ezetimibe  and Lipitor  -Hold Toprol  at this time   Prolonged QT interval Continue to monitor EKG for further prolongation of QTc.  Potassium within normal range.  Monitor electrolytes.   Chronic hypokalemia - Replete   Medication noncompliance -Counseled   Seizure disorder - Suspect this may be due to alcoholism,  continue Keppra    Chronic alcohol use - Patient reported he has been drinking 3 to 4 cans of beer on basis and last drink 6/18 - Monitor on CIWA folic acid , thiamine  and multivitamin. - Counseled at bedside for cessation of alcohol use.   DVT prophylaxis:  Lovenox  Code Status:  Full Code Family Communication: Friend at bedside Disposition Plan: Home likely  tomorrow  Antimicrobials:    Objective: Vitals:   06/14/24 2048 06/14/24 2358 06/15/24 0500 06/15/24 0822  BP: 112/79 113/69 115/75 125/84  Pulse: 77 84 77   Resp: 16 18 16 17   Temp: 98.2 F (36.8 C) 98.2 F (36.8 C) 98.2 F (36.8 C) 98 F (36.7 C)  TempSrc: Oral Oral Oral Axillary  SpO2: 99% 97% 94%   Weight:   67.3 kg   Height:        Intake/Output Summary (Last 24 hours) at 06/15/2024 1106 Last data filed at 06/14/2024 2358 Gross per 24 hour  Intake 948 ml  Output 2750 ml  Net -1802 ml   Filed Weights   06/13/24 0104 06/14/24 0500 06/15/24 0500  Weight: 70.1 kg 68.6 kg 67.3 kg    Examination:  General exam: AAO x 2, chronically ill appearing, no distress HEENT: No JVD CVS: S1-S2, regular rhythm Lungs: Decreased breath sounds at the bases Abdomen: Soft, nontender, bowel sounds present Extremities: No edema today Skin: No rashes Psychiatry: Flat affect    Data Reviewed:   CBC: Recent Labs  Lab 06/12/24 1152 06/13/24 0416  WBC 6.6 9.0  NEUTROABS 3.7  --   HGB 12.4* 13.4  HCT 39.5 42.0  MCV 98.3 95.9  PLT 238 234   Basic Metabolic Panel: Recent Labs  Lab 06/12/24 1152 06/13/24 0416 06/14/24 1126 06/15/24 0442  NA 136 139 139 135  K 3.6 3.3* 3.9 3.6  CL 106 106 98 101  CO2 23 26 28 25   GLUCOSE 97 137* 90 105*  BUN 6* 6* 8 13  CREATININE 1.08  1.05 1.12 1.34*  CALCIUM  8.4* 8.3* 8.7* 7.9*  MG  --   --  1.9  --    GFR: Estimated Creatinine Clearance: 55.1 mL/min (A) (by C-G formula based on SCr of 1.34 mg/dL (H)). Liver Function Tests: Recent Labs  Lab 06/12/24 1152 06/13/24 0416  AST 32 27  ALT 22 20  ALKPHOS 76 80  BILITOT 0.9 2.1*  PROT 6.4* 6.5  ALBUMIN  3.2* 3.3*   No results for input(s): LIPASE, AMYLASE in the last 168 hours. No results for input(s): AMMONIA in the last 168 hours. Coagulation Profile: No results for input(s): INR, PROTIME in the last 168 hours. Cardiac Enzymes: No results for input(s): CKTOTAL,  CKMB, CKMBINDEX, TROPONINI in the last 168 hours. BNP (last 3 results) No results for input(s): PROBNP in the last 8760 hours. HbA1C: No results for input(s): HGBA1C in the last 72 hours. CBG: No results for input(s): GLUCAP in the last 168 hours. Lipid Profile: No results for input(s): CHOL, HDL, LDLCALC, TRIG, CHOLHDL, LDLDIRECT in the last 72 hours. Thyroid  Function Tests: No results for input(s): TSH, T4TOTAL, FREET4, T3FREE, THYROIDAB in the last 72 hours. Anemia Panel: No results for input(s): VITAMINB12, FOLATE, FERRITIN, TIBC, IRON, RETICCTPCT in the last 72 hours. Urine analysis:    Component Value Date/Time   COLORURINE COLORLESS (A) 01/08/2020 1922   APPEARANCEUR CLEAR 01/08/2020 1922   LABSPEC 1.002 (L) 01/08/2020 1922   PHURINE 7.0 01/08/2020 1922   GLUCOSEU NEGATIVE 01/08/2020 1922   HGBUR NEGATIVE 01/08/2020 1922   BILIRUBINUR NEGATIVE 01/08/2020 1922   KETONESUR NEGATIVE 01/08/2020 1922   PROTEINUR NEGATIVE 01/08/2020 1922   NITRITE NEGATIVE 01/08/2020 1922   LEUKOCYTESUR NEGATIVE 01/08/2020 1922   Sepsis Labs: @LABRCNTIP (procalcitonin:4,lacticidven:4)  )No results found for this or any previous visit (from the past 240 hours).   Radiology Studies: ECHOCARDIOGRAM COMPLETE Result Date: 06/13/2024    ECHOCARDIOGRAM REPORT   Patient Name:   [name] [facility] Date of Exam: 06/13/2024 Medical Rec #:  [id number]           Height:       71.0 in Accession #:    [id number]          Weight:       154.6 lb Date of Birth:  09-05-1961            BSA:          1.890 m Patient Age:    63 years            BP:           133/81 mmHg Patient Gender: M                   HR:           69 bpm. Exam Location:  Inpatient Procedure: 2D Echo, Cardiac Doppler, Color Doppler and Intracardiac            Opacification Agent (Both Spectral and Color Flow Doppler were            utilized during procedure). Indications:    I50.40* Unspecified combined  systolic (congestive) and diastolic                 (congestive) heart failure  History:        Patient has prior history of Echocardiogram examinations, most                 recent 09/30/2022. CHF, CAD, Prior CABG and Abnormal ECG,  Arrythmias:Atrial Fibrillation; Risk Factors:Hypertension. ETOH.  Sonographer:    Ellouise Mose RDCS Referring Phys: [id number] [facility]  Sonographer Comments: Technically difficult study due to poor echo windows. IMPRESSIONS  1. Left ventricular ejection fraction, by estimation, is <20%. The left ventricle has severely decreased function. The left ventricle demonstrates global hypokinesis. The left ventricular internal cavity size was severely dilated. Left ventricular diastolic parameters were normal.  2. Device leads in RA/RV . Right ventricular systolic function is moderately reduced. The right ventricular size is severely enlarged. There is mildly elevated pulmonary artery systolic pressure.  3. Left atrial size was moderately dilated.  4. Right atrial size was moderately dilated.  5. The mitral valve is abnormal. No evidence of mitral valve regurgitation. No evidence of mitral stenosis. Severe mitral annular calcification.  6. The aortic valve is tricuspid. There is moderate calcification of the aortic valve. There is moderate thickening of the aortic valve. Aortic valve regurgitation is not visualized. Aortic valve sclerosis is present, with no evidence of aortic valve stenosis.  7. The inferior vena cava is dilated in size with >50% respiratory variability, suggesting right atrial pressure of 8 mmHg. FINDINGS  Left Ventricle: Left ventricular ejection fraction, by estimation, is <20%. The left ventricle has severely decreased function. The left ventricle demonstrates global hypokinesis. Definity  contrast agent was given IV to delineate the left ventricular endocardial borders. Strain was performed and the global longitudinal strain is indeterminate. The left  ventricular internal cavity size was severely dilated. There is no left ventricular hypertrophy. Left ventricular diastolic parameters were normal. Right Ventricle: Device leads in RA/RV. The right ventricular size is severely enlarged. No increase in right ventricular wall thickness. Right ventricular systolic function is moderately reduced. There is mildly elevated pulmonary artery systolic pressure. The tricuspid regurgitant velocity is 2.68 m/s, and with an assumed right atrial pressure of 8 mmHg, the estimated right ventricular systolic pressure is 36.7 mmHg. Left Atrium: Left atrial size was moderately dilated. Right Atrium: Right atrial size was moderately dilated. Pericardium: There is no evidence of pericardial effusion. Mitral Valve: The mitral valve is abnormal. There is moderate thickening of the mitral valve leaflet(s). There is moderate calcification of the mitral valve leaflet(s). Severe mitral annular calcification. No evidence of mitral valve regurgitation. No evidence of mitral valve stenosis. Tricuspid Valve: The tricuspid valve is normal in structure. Tricuspid valve regurgitation is trivial. No evidence of tricuspid stenosis. Aortic Valve: The aortic valve is tricuspid. There is moderate calcification of the aortic valve. There is moderate thickening of the aortic valve. Aortic valve regurgitation is not visualized. Aortic valve sclerosis is present, with no evidence of aortic valve stenosis. Pulmonic Valve: The pulmonic valve was normal in structure. Pulmonic valve regurgitation is trivial. No evidence of pulmonic stenosis. Aorta: The aortic root is normal in size and structure. Venous: The inferior vena cava is dilated in size with greater than 50% respiratory variability, suggesting right atrial pressure of 8 mmHg. IAS/Shunts: No atrial level shunt detected by color flow Doppler. Additional Comments: 3D was performed not requiring image post processing on an independent workstation and was  indeterminate. A device lead is visualized.  LEFT VENTRICLE PLAX 2D LVIDd:         5.70 cm      Diastology LVIDs:         4.90 cm      LV e' medial:    4.46 cm/s LV PW:         0.90 cm  LV E/e' medial:  17.2 LV IVS:        1.00 cm      LV e' lateral:   10.70 cm/s LVOT diam:     2.20 cm      LV E/e' lateral: 7.2 LV SV:         74 LV SV Index:   39 LVOT Area:     3.80 cm  LV Volumes (MOD) LV vol d, MOD A2C: 172.0 ml LV vol d, MOD A4C: 239.0 ml LV vol s, MOD A2C: 151.0 ml LV vol s, MOD A4C: 190.0 ml LV SV MOD A2C:     21.0 ml LV SV MOD A4C:     239.0 ml LV SV MOD BP:      33.6 ml RIGHT VENTRICLE            IVC RV S prime:     5.44 cm/s  IVC diam: 2.70 cm TAPSE (M-mode): 0.7 cm LEFT ATRIUM             Index        RIGHT ATRIUM           Index LA diam:        4.30 cm 2.28 cm/m   RA Area:     15.30 cm LA Vol (A2C):   62.4 ml 33.02 ml/m  RA Volume:   38.70 ml  20.48 ml/m LA Vol (A4C):   31.6 ml 16.72 ml/m LA Biplane Vol: 44.8 ml 23.70 ml/m  AORTIC VALVE LVOT Vmax:   118.50 cm/s LVOT Vmean:  76.800 cm/s LVOT VTI:    0.196 m  AORTA Ao Root diam: 3.20 cm Ao Asc diam:  3.20 cm MITRAL VALVE               TRICUSPID VALVE MV Area (PHT): 4.18 cm    TR Peak grad:   28.7 mmHg MV Decel Time: 182 msec    TR Vmax:        268.00 cm/s MV E velocity: 76.75 cm/s MV A velocity: 63.65 cm/s  SHUNTS MV E/A ratio:  1.21        Systemic VTI:  0.20 m                            Systemic Diam: 2.20 cm Maude Emmer MD Electronically signed by Maude Emmer MD Signature Date/Time: 06/13/2024/12:33:48 PM    Final      Scheduled Meds:  atorvastatin   80 mg Oral Daily   empagliflozin   10 mg Oral Daily   enoxaparin  (LOVENOX ) injection  40 mg Subcutaneous Q24H   ezetimibe   10 mg Oral Daily   folic acid   1 mg Oral Daily   levETIRAcetam   500 mg Oral BID   [START ON 06/16/2024] losartan   25 mg Oral Daily   multivitamin with minerals  1 tablet Oral Daily   sodium chloride  flush  3 mL Intravenous Q12H   sodium chloride  flush  3 mL  Intravenous Q12H   thiamine   100 mg Oral Daily   Or   thiamine   100 mg Intravenous Daily   torsemide   40 mg Oral Daily   Continuous Infusions:     LOS: 3 days    Time spent:    Sigurd Pac, MD [facility]   06/15/2024, 11:06 AM

## 2024-06-16 ENCOUNTER — Other Ambulatory Visit (HOSPITAL_COMMUNITY): Payer: Self-pay

## 2024-06-16 DIAGNOSIS — I5043 Acute on chronic combined systolic (congestive) and diastolic (congestive) heart failure: Secondary | ICD-10-CM

## 2024-06-16 LAB — BASIC METABOLIC PANEL WITH GFR
Anion gap: 11 (ref 5–15)
BUN: 13 mg/dL (ref 8–23)
CO2: 27 mmol/L (ref 22–32)
Calcium: 8.2 mg/dL — ABNORMAL LOW (ref 8.9–10.3)
Chloride: 96 mmol/L — ABNORMAL LOW (ref 98–111)
Creatinine, Ser: 1.13 mg/dL (ref 0.61–1.24)
GFR, Estimated: >60 mL/min (ref >60)
Glucose, Bld: 112 mg/dL — ABNORMAL HIGH (ref 70–99)
Potassium: 3.4 mmol/L — ABNORMAL LOW (ref 3.5–5.1)
Sodium: 134 mmol/L — ABNORMAL LOW (ref 135–145)

## 2024-06-16 MED ORDER — TORSEMIDE 20 MG PO TABS
60.0000 mg | ORAL_TABLET | Freq: Every day | ORAL | 1 refills | Status: DC
  Filled 2024-06-16: qty 90, 30d supply, fill #0

## 2024-06-16 MED ORDER — LOSARTAN POTASSIUM 25 MG PO TABS
25.0000 mg | ORAL_TABLET | Freq: Every day | ORAL | 1 refills | Status: DC
Start: 2024-06-16 — End: 2024-08-06
  Filled 2024-06-16: qty 90, 90d supply, fill #0

## 2024-06-16 MED ORDER — ATORVASTATIN CALCIUM 80 MG PO TABS
80.0000 mg | ORAL_TABLET | Freq: Every day | ORAL | 1 refills | Status: DC
  Filled 2024-06-16: qty 30, 30d supply, fill #0

## 2024-06-16 MED ORDER — POTASSIUM CHLORIDE CRYS ER 20 MEQ PO TBCR
40.0000 meq | EXTENDED_RELEASE_TABLET | Freq: Every day | ORAL | 1 refills | Status: DC
  Filled 2024-06-16: qty 60, 30d supply, fill #0

## 2024-06-16 MED ORDER — EZETIMIBE 10 MG PO TABS
10.0000 mg | ORAL_TABLET | Freq: Every day | ORAL | 1 refills | Status: DC
  Filled 2024-06-16: qty 90, 90d supply, fill #0

## 2024-06-16 MED ORDER — EMPAGLIFLOZIN 10 MG PO TABS
10.0000 mg | ORAL_TABLET | Freq: Every day | ORAL | 1 refills | Status: DC
  Filled 2024-06-16: qty 30, 30d supply, fill #0

## 2024-06-16 MED ORDER — POTASSIUM CHLORIDE CRYS ER 20 MEQ PO TBCR
40.0000 meq | EXTENDED_RELEASE_TABLET | Freq: Once | ORAL | Status: AC
  Administered 2024-06-16: 40 meq via ORAL
  Filled 2024-06-16: qty 2

## 2024-06-16 MED ORDER — LEVETIRACETAM 500 MG PO TABS
500.0000 mg | ORAL_TABLET | Freq: Two times a day (BID) | ORAL | 1 refills | Status: DC
  Filled 2024-06-16: qty 120, 60d supply, fill #0

## 2024-06-16 NOTE — TOC Progression Note (Signed)
 Transition of Care [facility]) - Progression Note    Patient Details  Name: [name] MRN: [id number] Date of Birth: December 10, 1961  Transition of Care [facility]) CM/SW Contact  Arlana JINNY Moats, LCSWA Phone Number: 06/16/2024, 2:37 PM  Clinical Narrative:   2:36 PM- HF CSW called patient and left a VM to assist with setting up transportation for future appointments. Patient has medicaid, and round trip appointments can be scheduled 3-5 days in advance to arrange transportation.   Medicaid: [id number].   TOC will continue following.          Expected Discharge Plan and Services         Expected Discharge Date: 06/16/24                                     Social Determinants of Health (SDOH) Interventions SDOH Screenings   Food Insecurity: No Food Insecurity (06/13/2024)  Housing: Unknown (06/13/2024)  Transportation Needs: No Transportation Needs (06/13/2024)  Utilities: Not At Risk (06/13/2024)  Depression (PHQ2-9): Medium Risk (11/08/2022)  Financial Resource Strain: High Risk (02/15/2023)  Tobacco Use: Low Risk  (06/12/2024)    Readmission Risk Interventions     No data to display

## 2024-06-17 ENCOUNTER — Telehealth: Payer: Self-pay | Admitting: *Deleted

## 2024-06-17 NOTE — Transitions of Care (Post Inpatient/ED Visit) (Signed)
   06/17/2024  Name: [name] MRN: [id number] DOB: 30-Nov-1961  Today's TOC FU Call Status: Today's TOC FU Call Status:: Unsuccessful Call (2nd Attempt) Unsuccessful Call (2nd Attempt) Date: 06/17/24  Attempted to reach the patient regarding the most recent Inpatient/ED visit.  Follow Up Plan: Additional outreach attempts will be made to reach the patient to complete the Transitions of Care (Post Inpatient/ED visit) call.   Andrea Dimes RN, BSN [place]  [facility] RN Care Manager [phone]

## 2024-06-17 NOTE — Transitions of Care (Post Inpatient/ED Visit) (Signed)
   06/17/2024  Name: [name] MRN: [id number] DOB: [birth year]  Today's TOC FU Call Status: Today's TOC FU Call Status:: Unsuccessful Call (1st Attempt) Unsuccessful Call (1st Attempt) Date: 06/17/24  Attempted to reach the patient regarding the most recent Inpatient/ED visit.  Follow Up Plan: Additional outreach attempts will be made to reach the patient to complete the Transitions of Care (Post Inpatient/ED visit) call.   Andrea Dimes RN, BSN   [facility] RN Care Manager [phone]

## 2024-06-18 ENCOUNTER — Telehealth: Payer: Self-pay

## 2024-06-18 NOTE — Transitions of Care (Post Inpatient/ED Visit) (Signed)
   06/18/2024  Name: [name] MRN: [id number] DOB: [birth year]  Today's TOC FU Call Status: Today's TOC FU Call Status:: Unsuccessful Call (3rd Attempt) Unsuccessful Call (3rd Attempt) Date: 06/18/24  Attempted to reach the patient regarding the most recent Inpatient/ED visit.  Follow Up Plan: No further outreach attempts will be made at this time. We have been unable to contact the patient.  Alan Ee, RN, BSN, CEN [employer]- Transition of Care Team.  [facility] [phone]

## 2024-06-26 NOTE — Discharge Summary (Signed)
 Physician Discharge Summary  [name] FMW:[id number] DOB: Jul 28, 1961 DOA: 06/12/2024  PCP: Theotis Haze ORN, NP  Admit date: 06/12/2024 Discharge date: 06/16/2024  Time spent: 35 minutes  Recommendations for Outpatient Follow-up:  Advanced heart failure clinic in few weeks, message sent, please check BMP at follow-up   Discharge Diagnoses:     Acute on chronic combined systolic and diastolic CHF (congestive heart failure) (HCC)   Mixed hyperlipidemia   Seizure disorder (HCC)   S/P CABG x 4   Prolonged QT interval   History of ventricular fibrillation   History of CAD (coronary artery disease)   H/O medication noncompliance   Acute hypoxic respiratory failure (HCC)   Alcohol use   Discharge Condition: Improved  Diet recommendation: Low-sodium, heart healthy  Filed Weights   06/14/24 0500 06/15/24 0500 06/16/24 0410  Weight: 68.6 kg 67.3 kg 66.5 kg    History of present illness:  63/M with chronic systolic CHF, V-fib arrest in 7981, CAD/CABG, EtOH use, seizure disorder presented to the ED with dyspnea orthopnea and lower extremity edema for few weeks, allegedly ran out of meds a month ago. - In the ED mildly hypoxic, troponin 18, 19, EKG with LBBB, PVCs, BNP 762, albumin  3.2, chest x-ray with cardiomegaly and pulmonary vascular congestion  Hospital Course:   Acute on chronic combined systolic and diastolic CHF -Last echo 10/23 with EF less than 20%, grade 2 DD, severely reduced RV -Long history of poor compliance and alcoholism, allegedly out of meds for 1 month - Improving with diuresis, 9.3 L negative, appears euvolemic, creatinine starting to trend up, hold further IV Lasix , start oral torsemide   - Continue losartan , Jardiance  -Hold Toprol  - Repeat echo with EF less than 20%, moderately reduced RV - Unfortunately seems poorly motivated to change his habits, unable to get para medicine services, will add home health RN services at [place]   History of CAD status  post CABG - Continue aspirin , ezetimibe  and Lipitor  - Toprol  held with severe BiV failure   Prolonged QT interval -Caution with meds   Chronic hypokalemia - Repleted   Medication noncompliance -Counseled   Seizure disorder - Suspect this may be due to alcoholism,  continue Keppra    Chronic alcohol use - Patient reported he has been drinking 3 to 4 cans of beer on basis and last drink 6/18 - No withdrawal noted. - Counseled at bedside for cessation of alcohol use.   Discharge Exam: Vitals:   06/15/24 2328 06/16/24 0410  BP: 109/74 118/81  Pulse: 85 85  Resp: 18 17  Temp: 98.5 F (36.9 C) 98.2 F (36.8 C)  SpO2: 97% 97%    Gen: Awake, Alert, Oriented X 3,  HEENT: no JVD Lungs: Good air movement bilaterally, CTAB CVS: S1S2/RRR Abd: soft, Non tender, non distended, BS present Extremities: No edema Skin: no new rashes on exposed skin   Discharge Instructions   Discharge Instructions     Diet - low sodium heart healthy   Complete by: As directed    Increase activity slowly   Complete by: As directed       Allergies as of 06/16/2024       Reactions   Aldactone  [spironolactone ] Other (See Comments)   Gynecomastia         Medication List     STOP taking these medications    metoprolol  succinate 25 MG 24 hr tablet Commonly known as: TOPROL -XL       TAKE these medications    Aspirin  Low  Dose 81 MG tablet Generic drug: aspirin  EC TAKE 1 TABLET (81 MG TOTAL) BY MOUTH DAILY. SWALLOW WHOLE. (AM)   atorvastatin  80 MG tablet Commonly known as: LIPITOR  Take 1 tablet (80 mg total) by mouth daily. What changed: See the new instructions.   ezetimibe  10 MG tablet Commonly known as: ZETIA  Take 1 tablet (10 mg total) by mouth daily.   Jardiance  10 MG Tabs tablet Generic drug: empagliflozin  Take 1 tablet (10 mg total) by mouth daily. What changed: See the new instructions.   levETIRAcetam  500 MG tablet Commonly known as: KEPPRA  Take 1 tablet (500  mg total) by mouth 2 (two) times daily.   losartan  25 MG tablet Commonly known as: COZAAR  Take 1 tablet (25 mg total) by mouth daily. What changed: See the new instructions.   potassium chloride  SA 20 MEQ tablet Commonly known as: Klor-Con  M20 Take 2 tablets (40 mEq total) by mouth daily. What changed: how much to take   torsemide  20 MG tablet Commonly known as: DEMADEX  Take 3 tablets (60 mg total) by mouth daily. What changed:  medication strength how much to take       Allergies  Allergen Reactions   Aldactone  [Spironolactone ] Other (See Comments)    Gynecomastia       The results of significant diagnostics from this hospitalization (including imaging, microbiology, ancillary and laboratory) are listed below for reference.    Significant Diagnostic Studies: ECHOCARDIOGRAM COMPLETE Result Date: 06/13/2024    ECHOCARDIOGRAM REPORT   Patient Name:   [name] [facility] Date of Exam: 06/13/2024 Medical Rec #:  [id number]           Height:       71.0 in Accession #:    [id number]          Weight:       154.6 lb Date of Birth:  [birth year]            BSA:          1.890 m Patient Age:    63 years            BP:           133/81 mmHg Patient Gender: M                   HR:           69 bpm. Exam Location:  Inpatient Procedure: 2D Echo, Cardiac Doppler, Color Doppler and Intracardiac            Opacification Agent (Both Spectral and Color Flow Doppler were            utilized during procedure). Indications:    I50.40* Unspecified combined systolic (congestive) and diastolic                 (congestive) heart failure  History:        Patient has prior history of Echocardiogram examinations, most                 recent 09/30/2022. CHF, CAD, Prior CABG and Abnormal ECG,                 Arrythmias:Atrial Fibrillation; Risk Factors:Hypertension. ETOH.  Sonographer:    Ellouise Mose RDCS Referring Phys: [id number] [facility]  Sonographer Comments: Technically difficult study due to poor echo windows.  IMPRESSIONS  1. Left ventricular ejection fraction, by estimation, is <20%. The left ventricle has severely decreased function. The left ventricle demonstrates global hypokinesis. The left  ventricular internal cavity size was severely dilated. Left ventricular diastolic parameters were normal.  2. Device leads in RA/RV . Right ventricular systolic function is moderately reduced. The right ventricular size is severely enlarged. There is mildly elevated pulmonary artery systolic pressure.  3. Left atrial size was moderately dilated.  4. Right atrial size was moderately dilated.  5. The mitral valve is abnormal. No evidence of mitral valve regurgitation. No evidence of mitral stenosis. Severe mitral annular calcification.  6. The aortic valve is tricuspid. There is moderate calcification of the aortic valve. There is moderate thickening of the aortic valve. Aortic valve regurgitation is not visualized. Aortic valve sclerosis is present, with no evidence of aortic valve stenosis.  7. The inferior vena cava is dilated in size with >50% respiratory variability, suggesting right atrial pressure of 8 mmHg. FINDINGS  Left Ventricle: Left ventricular ejection fraction, by estimation, is <20%. The left ventricle has severely decreased function. The left ventricle demonstrates global hypokinesis. Definity  contrast agent was given IV to delineate the left ventricular endocardial borders. Strain was performed and the global longitudinal strain is indeterminate. The left ventricular internal cavity size was severely dilated. There is no left ventricular hypertrophy. Left ventricular diastolic parameters were normal. Right Ventricle: Device leads in RA/RV. The right ventricular size is severely enlarged. No increase in right ventricular wall thickness. Right ventricular systolic function is moderately reduced. There is mildly elevated pulmonary artery systolic pressure. The tricuspid regurgitant velocity is 2.68 m/s, and with an  assumed right atrial pressure of 8 mmHg, the estimated right ventricular systolic pressure is 36.7 mmHg. Left Atrium: Left atrial size was moderately dilated. Right Atrium: Right atrial size was moderately dilated. Pericardium: There is no evidence of pericardial effusion. Mitral Valve: The mitral valve is abnormal. There is moderate thickening of the mitral valve leaflet(s). There is moderate calcification of the mitral valve leaflet(s). Severe mitral annular calcification. No evidence of mitral valve regurgitation. No evidence of mitral valve stenosis. Tricuspid Valve: The tricuspid valve is normal in structure. Tricuspid valve regurgitation is trivial. No evidence of tricuspid stenosis. Aortic Valve: The aortic valve is tricuspid. There is moderate calcification of the aortic valve. There is moderate thickening of the aortic valve. Aortic valve regurgitation is not visualized. Aortic valve sclerosis is present, with no evidence of aortic valve stenosis. Pulmonic Valve: The pulmonic valve was normal in structure. Pulmonic valve regurgitation is trivial. No evidence of pulmonic stenosis. Aorta: The aortic root is normal in size and structure. Venous: The inferior vena cava is dilated in size with greater than 50% respiratory variability, suggesting right atrial pressure of 8 mmHg. IAS/Shunts: No atrial level shunt detected by color flow Doppler. Additional Comments: 3D was performed not requiring image post processing on an independent workstation and was indeterminate. A device lead is visualized.  LEFT VENTRICLE PLAX 2D LVIDd:         5.70 cm      Diastology LVIDs:         4.90 cm      LV e' medial:    4.46 cm/s LV PW:         0.90 cm      LV E/e' medial:  17.2 LV IVS:        1.00 cm      LV e' lateral:   10.70 cm/s LVOT diam:     2.20 cm      LV E/e' lateral: 7.2 LV SV:         74  LV SV Index:   39 LVOT Area:     3.80 cm  LV Volumes (MOD) LV vol d, MOD A2C: 172.0 ml LV vol d, MOD A4C: 239.0 ml LV vol s, MOD  A2C: 151.0 ml LV vol s, MOD A4C: 190.0 ml LV SV MOD A2C:     21.0 ml LV SV MOD A4C:     239.0 ml LV SV MOD BP:      33.6 ml RIGHT VENTRICLE            IVC RV S prime:     5.44 cm/s  IVC diam: 2.70 cm TAPSE (M-mode): 0.7 cm LEFT ATRIUM             Index        RIGHT ATRIUM           Index LA diam:        4.30 cm 2.28 cm/m   RA Area:     15.30 cm LA Vol (A2C):   62.4 ml 33.02 ml/m  RA Volume:   38.70 ml  20.48 ml/m LA Vol (A4C):   31.6 ml 16.72 ml/m LA Biplane Vol: 44.8 ml 23.70 ml/m  AORTIC VALVE LVOT Vmax:   118.50 cm/s LVOT Vmean:  76.800 cm/s LVOT VTI:    0.196 m  AORTA Ao Root diam: 3.20 cm Ao Asc diam:  3.20 cm MITRAL VALVE               TRICUSPID VALVE MV Area (PHT): 4.18 cm    TR Peak grad:   28.7 mmHg MV Decel Time: 182 msec    TR Vmax:        268.00 cm/s MV E velocity: 76.75 cm/s MV A velocity: 63.65 cm/s  SHUNTS MV E/A ratio:  1.21        Systemic VTI:  0.20 m                            Systemic Diam: 2.20 cm Maude Emmer MD Electronically signed by Maude Emmer MD Signature Date/Time: 06/13/2024/12:33:48 PM    Final    DG Chest 2 View Result Date: 06/12/2024 CLINICAL DATA:  sob, leg swelling EXAM: CHEST - 2 VIEW COMPARISON:  January 16, 2023 FINDINGS: Central pulmonary vascular congestion. Elevation of the left hemidiaphragm with streaky left basilar atelectasis. No focal airspace consolidation, pleural effusion, or pneumothorax. Unchanged cardiomegaly. Left chest pacemaker/AICD with a single lead terminating in the right ventricle. CABG markers and sternotomy wires. No acute fracture or destructive lesion. IMPRESSION: Unchanged cardiomegaly with mild central pulmonary vascular congestion. No overt pulmonary edema or pleural effusions. Electronically Signed   By: Rogelia Myers M.D.   On: 06/12/2024 13:33    Microbiology: No results found for this or any previous visit (from the past 240 hours).   Labs: Basic Metabolic Panel: No results for input(s): NA, K, CL, CO2, GLUCOSE,  BUN, CREATININE, CALCIUM , MG, PHOS in the last 168 hours. Liver Function Tests: No results for input(s): AST, ALT, ALKPHOS, BILITOT, PROT, ALBUMIN  in the last 168 hours. No results for input(s): LIPASE, AMYLASE in the last 168 hours. No results for input(s): AMMONIA in the last 168 hours. CBC: No results for input(s): WBC, NEUTROABS, HGB, HCT, MCV, PLT in the last 168 hours. Cardiac Enzymes: No results for input(s): CKTOTAL, CKMB, CKMBINDEX, TROPONINI in the last 168 hours. BNP: BNP (last 3 results) Recent Labs    10/11/23 1236 01/14/24 1406 06/12/24 1152  BNP 469.4* 363.4* 762.9*    ProBNP (last 3 results) No results for input(s): PROBNP in the last 8760 hours.  CBG: No results for input(s): GLUCAP in the last 168 hours.     Signed:  Sigurd Pac MD.  [facility] 06/26/2024, 2:28 PM

## 2024-06-30 ENCOUNTER — Telehealth (HOSPITAL_COMMUNITY): Payer: Self-pay | Admitting: *Deleted

## 2024-06-30 NOTE — Progress Notes (Incomplete)
 ADVANCED HF CLINIC NOTE   PCP: Haze Servant, NP HF Cardiologist: Dr Cherrie   Reason for Visit: Heart Failure f/u  HPI: [name] is a 63 y.o. male with h/o ETOH abuse, VF Arrest 2018, CAD s/p CABG 12/07/17, and ICM with EF 25-30%.   Admitted 12/18 with VF arrest while chopping wood. Received CPR in the field. R/LHC with severe 3vD as below and cardiogenic shock requiring IABP and milrinone . Required milrinone  with continued shock. Pt improved and IABP removed 12/10, but pt had recurrent VF arrest so IABP replaced. Stabilized and underwent CABG 12/07/17. Tolerated gradual wean of meds and extubation with no further events.  Echo 5/20 EF at 25%.    9/20 implantation of a [employer] single chamber ICD.   Routine clinic f/u on 10/23/19 and while in exam room had a witnessed seizure with loss of pulse. CPR started with quick ROSC. He did not require shock. Taken to the ED and had another seizure. Neurology consulted. Started on Keppra . ICD interrogated. No arrhythmias noted. Admitted to [facility] on 12/09/19 with recurrent seizures in setting of noncompliance with his Keppra .    Admitted 8/21 with COVID PNA.   Follow up 5/22 he was having facial and arm tingling, no other neuro symptoms. Ethanol level 145. Carvedilol  increased and carotid u/s ordered showing bilateral ICA 1-39%.  Follow up 9/22, he was off all meds. Resumed meds and re-enrolled with Paramedicine. Follow up 10/22, he remained off all meds, NYHA II. Meds restarted, but lost to follow up.   Follow up 09/11/22, off all meds except Keppra , felt bad and volume overloaded.  Attempted outpatient diuresis and titration of GDMT but admitted in 10/23 d/t a/c CHF.   Seen for f/u 12/12/22. Had been in [place] ED for belly pain and swelling. Says they gave him an Advil and sent him home. No records to review. Continued to endorse abdominal pain at f/u. Lipase, Tbili, indirect bilirubin and direct bilirubin  elevated. He was referred to GI for workup. Also appeared volume overloaded and IV lasix  arranged in infusion clinic.   Had f/u here in the [facility] 2/14 and was volume overloaded w/ NYHA IIIb symptoms. HL score 27. Continued abdominal swelling. Lasix  was discontinued and he was started on torsemide  80 mg daily.   Today he returns for AHF follow up, stressed about taking care of her girlfriend who got hit by a car as she was riding her bike and is now paraplegic. Overall feeling ok. Denies palpitations, CP, dizziness, edema, or PND/Orthopnea. SOB ok, gets winded with activity. Appetite ok. No fever or chills. Does not weight at home. Taking all medications. Denies tobacco or drug use. Has been drinking more than usual 3-5 beers/day. Denies any recent seizures.   Device interrogation shows elevated HL score at 24. No VT  ROS: All systems negative except as listed in HPI, PMH and Problem List.  SH:  Social History   Socioeconomic History   Marital status: Divorced    Spouse name: Not on file   Number of children: Not on file   Years of education: Not on file   Highest education level: Not on file  Occupational History   Not on file  Tobacco Use   Smoking status: Never   Smokeless tobacco: Never  Vaping Use   Vaping status: Never Used  Substance and Sexual Activity   Alcohol use: Yes    Alcohol/week: 14.0 standard drinks of alcohol    Types: 14 Cans of  beer per week    Comment: patient endorses at least 1-2 beers daily (04/2020); 03/2023   Drug use: No   Sexual activity: Not Currently  Other Topics Concern   Not on file  Social History Narrative   Lives at home with his brother who is crippled. He takes care of his brother.    Right handed    Social Drivers of Health   Financial Resource Strain: High Risk (02/15/2023)   Overall Financial Resource Strain (CARDIA)    Difficulty of Paying Living Expenses: Very hard  Food Insecurity: No Food Insecurity (06/13/2024)   Hunger Vital Sign     Worried About Running Out of Food in the Last Year: Never true    Ran Out of Food in the Last Year: Never true  Transportation Needs: No Transportation Needs (06/13/2024)   PRAPARE - Administrator, Civil Service (Medical): No    Lack of Transportation (Non-Medical): No  Physical Activity: Not on file  Stress: Not on file  Social Connections: Not on file  Intimate Partner Violence: Not At Risk (06/13/2024)   Humiliation, Afraid, Rape, and Kick questionnaire    Fear of Current or Ex-Partner: No    Emotionally Abused: No    Physically Abused: No    Sexually Abused: No   FH:  Family History  Problem Relation Age of Onset   Alzheimer's disease Mother    Lung disease Father    Heart attack Other    Diabetes Neg Hx    Colon cancer Neg Hx    Esophageal cancer Neg Hx    Pancreatic cancer Neg Hx    Stomach cancer Neg Hx    Past Medical History:  Diagnosis Date   AICD (automatic cardioverter/defibrillator) present    Alcohol abuse 05/18/2020   Arthritis    hands; maybe my toes (12/20/2017)   CAD (coronary artery disease)    Cardiac arrest (HCC) 11/29/2017   hx VF arrest/notes 12/20/2017   CHF (congestive heart failure) (HCC)    Chronic systolic heart failure (HCC) 08/25/2019   Coronary artery disease    Coronary artery disease involving native coronary artery of native heart without angina pectoris 12/07/2017   Essential hypertension 05/18/2020   GERD (gastroesophageal reflux disease)    H/O ETOH abuse    /notes 12/20/2017   High cholesterol    Hypertension    Ischemic cardiomyopathy    a. 08/2019 s/p BSX D232 single lead AICD.   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) 12/30/2019   Syncope and collapse    last few days (12/20/2017)   Current  Outpatient Medications  Medication Sig Dispense Refill   ASPIRIN  LOW DOSE 81 MG tablet TAKE 1 TABLET (81 MG TOTAL) BY MOUTH DAILY. SWALLOW WHOLE. (AM) (Patient not taking: Reported on 06/12/2024) 30 tablet 11   atorvastatin  (LIPITOR ) 80 MG tablet Take 1 tablet (80 mg total) by mouth daily. 30 tablet 1   empagliflozin  (JARDIANCE ) 10 MG TABS tablet Take 1 tablet (10 mg total) by mouth daily. 30 tablet 1   ezetimibe  (ZETIA ) 10 MG tablet Take 1 tablet (10 mg total) by mouth daily. 90 tablet 1   levETIRAcetam  (KEPPRA ) 500 MG tablet Take 1 tablet (500 mg total) by mouth 2 (two) times daily. 120 tablet 1  losartan  (COZAAR ) 25 MG tablet Take 1 tablet (25 mg total) by mouth daily. 90 tablet 1   potassium chloride  SA (KLOR-CON  M20) 20 MEQ tablet Take 2 tablets (40 mEq total) by mouth daily. 60 tablet 1   torsemide  (DEMADEX ) 20 MG tablet Take 3 tablets (60 mg total) by mouth daily. 90 tablet 1   No current facility-administered medications for this visit.   There were no vitals taken for this visit.  Wt Readings from Last 3 Encounters:  06/16/24 66.5 kg (146 lb 9.6 oz)  01/14/24 67.9 kg (149 lb 12.8 oz)  10/11/23 68.2 kg (150 lb 6.4 oz)   PHYSICAL EXAM: General:  well appearing.  No respiratory difficulty. Walked into clinic HEENT: normal Neck: supple. JVD ~10 cm. Carotids 2+ bilat; no bruits. No lymphadenopathy or thyromegaly appreciated. Cor: PMI nondisplaced. Regular rate & rhythm. No rubs, gallops or murmurs. Lungs: clear Abdomen: soft, nontender, nondistended. No hepatosplenomegaly. No bruits or masses. Good bowel sounds. Extremities: no cyanosis, clubbing, rash, trace ankle edema  Neuro: alert & oriented x 3, cranial nerves grossly intact. moves all 4 extremities w/o difficulty. Affect pleasant.   ASSESSMENT & PLAN: 1. Chronic Biventricular HF, ICM  - Echo 11/30/17 25-30% RV normal.   - Echo 03/2018: EF 40-45%  RV normal.  - Echo 5/20 EF ~ 25%. Has ICD.  - Echo 12/2019 EF 30-35% RV  normal. - Mixed Ischemic + probable ETOH mediated component  - Echo (10/23): biventricular failure getting worse from LVEF 30-35% Mod RV--> LVEF < 20% severely reduced RV.  -Has [employer] ICD. Latitude  Score =24. No VT detection   -NYHA II-IIIa. Volume mildly elevated. HL Score elevated at 24. Suspect with increased ETOH use. BP is soft, limiting GDMT titration today. He is stable w/o orthostatic symptoms  - Increase torsemide  80>100 mg daily and increase KDUR 80>100 mEq daily - Continue Toprol  XL 12.5 mg daily. - Continue Jardiance  10 mg daily.  - Continue losartan  25 mg daily.  - Now off digoxin  d/t elevated level. - No spiro with h/o gynecomastia. Could consider Inspra  but would avoid given h/o poor compliance  - Not a candidate for advanced therapies/transplant with ongoing ETOH/substance abuse and compliance (this includes home inotrope).  - check CMP and BNP today    2. CAD - S/P CABG x 3 2018  - denies anginal symptoms  - Continue ASA 81 mg  - Continue Lipitor  80 mg. LDL 121 10/24 < 70   - Start Zetia  10 mg daily  - Continue Toprol  XL 12.5 mg daily   3. Seizure Disorder - On Keppra .  - he denies any recent seizures  - followed by outpatient neurology    4. ETOH Abuse - Drinking 3-5 beers a day, discussed cessation.   5. SDOH - Asked SW to see today. Needs help with food.   F/u in 2 months w/ Dr. [name] or APP   Osa Campoli M [place], AGACNP-BC  06/30/24

## 2024-06-30 NOTE — Telephone Encounter (Signed)
 Called to confirm/remind patient of their appointment at the [facility] Failure Clinic on 06/30/24.    Appointment:              [] Confirmed             [x] Left mess              [] No answer/No voice mail             [] Phone not in service   Patient reminded to bring all medications and/or complete list.   Confirmed patient has transportation. Gave directions, instructed to utilize valet parking.

## 2024-07-01 ENCOUNTER — Encounter (HOSPITAL_COMMUNITY)

## 2024-07-11 ENCOUNTER — Telehealth: Payer: Self-pay | Admitting: Nurse Practitioner

## 2024-07-11 NOTE — Telephone Encounter (Signed)
 Pt confirmed appt per sister

## 2024-07-14 ENCOUNTER — Ambulatory Visit: Admitting: Nurse Practitioner

## 2024-07-31 ENCOUNTER — Telehealth (HOSPITAL_COMMUNITY): Payer: Self-pay | Admitting: Family Medicine

## 2024-08-04 ENCOUNTER — Telehealth (HOSPITAL_COMMUNITY): Payer: Self-pay

## 2024-08-04 NOTE — Telephone Encounter (Signed)
 Called to confirm/remind patient of their appointment at the [facility] on 08/05/24 11:30.   Appointment:   [] Confirmed  [x] Left mess   [] No answer/No voice mail  [] VM Full/unable to leave message  [] Phone not in service  Patient reminded to bring all medications and/or complete list.  Confirmed patient has transportation. Gave directions, instructed to utilize valet parking.

## 2024-08-05 ENCOUNTER — Encounter (HOSPITAL_COMMUNITY): Payer: Self-pay

## 2024-08-05 ENCOUNTER — Ambulatory Visit (HOSPITAL_BASED_OUTPATIENT_CLINIC_OR_DEPARTMENT_OTHER)
Admission: RE | Admit: 2024-08-05 | Discharge: 2024-08-05 | Disposition: A | Discharge status: Home / Self Care | Source: Ambulatory Visit | Attending: Cardiology | Admitting: Cardiology

## 2024-08-05 ENCOUNTER — Ambulatory Visit (HOSPITAL_COMMUNITY): Payer: Self-pay | Admitting: Cardiology

## 2024-08-05 ENCOUNTER — Other Ambulatory Visit: Payer: Self-pay

## 2024-08-05 ENCOUNTER — Observation Stay (HOSPITAL_COMMUNITY)
Admission: EM | Admit: 2024-08-05 | Discharge: 2024-08-06 | Disposition: A | Discharge status: Home / Self Care | Source: Ambulatory Visit | Attending: Internal Medicine | Admitting: Internal Medicine

## 2024-08-05 ENCOUNTER — Observation Stay (HOSPITAL_COMMUNITY)

## 2024-08-05 ENCOUNTER — Emergency Department (HOSPITAL_COMMUNITY)

## 2024-08-05 VITALS — BP 140/86 | HR 82 | Ht 71.0 in | Wt 149.0 lb

## 2024-08-05 DIAGNOSIS — I2510 Atherosclerotic heart disease of native coronary artery without angina pectoris: Secondary | ICD-10-CM | POA: Diagnosis not present

## 2024-08-05 DIAGNOSIS — Z8674 Personal history of sudden cardiac arrest: Secondary | ICD-10-CM | POA: Insufficient documentation

## 2024-08-05 DIAGNOSIS — Z79899 Other long term (current) drug therapy: Secondary | ICD-10-CM | POA: Insufficient documentation

## 2024-08-05 DIAGNOSIS — R252 Cramp and spasm: Secondary | ICD-10-CM | POA: Insufficient documentation

## 2024-08-05 DIAGNOSIS — I5082 Biventricular heart failure: Secondary | ICD-10-CM | POA: Insufficient documentation

## 2024-08-05 DIAGNOSIS — F1010 Alcohol abuse, uncomplicated: Secondary | ICD-10-CM | POA: Insufficient documentation

## 2024-08-05 DIAGNOSIS — G40409 Other generalized epilepsy and epileptic syndromes, not intractable, without status epilepticus: Secondary | ICD-10-CM

## 2024-08-05 DIAGNOSIS — I5022 Chronic systolic (congestive) heart failure: Secondary | ICD-10-CM | POA: Diagnosis not present

## 2024-08-05 DIAGNOSIS — G40909 Epilepsy, unspecified, not intractable, without status epilepticus: Secondary | ICD-10-CM | POA: Insufficient documentation

## 2024-08-05 DIAGNOSIS — E876 Hypokalemia: Principal | ICD-10-CM | POA: Diagnosis present

## 2024-08-05 DIAGNOSIS — E8770 Fluid overload, unspecified: Secondary | ICD-10-CM | POA: Diagnosis not present

## 2024-08-05 DIAGNOSIS — Z8616 Personal history of COVID-19: Secondary | ICD-10-CM | POA: Insufficient documentation

## 2024-08-05 DIAGNOSIS — E782 Mixed hyperlipidemia: Secondary | ICD-10-CM

## 2024-08-05 DIAGNOSIS — Z951 Presence of aortocoronary bypass graft: Secondary | ICD-10-CM

## 2024-08-05 DIAGNOSIS — Z7984 Long term (current) use of oral hypoglycemic drugs: Secondary | ICD-10-CM | POA: Insufficient documentation

## 2024-08-05 DIAGNOSIS — F1090 Alcohol use, unspecified, uncomplicated: Secondary | ICD-10-CM | POA: Insufficient documentation

## 2024-08-05 DIAGNOSIS — G9341 Metabolic encephalopathy: Secondary | ICD-10-CM | POA: Diagnosis not present

## 2024-08-05 DIAGNOSIS — Z7982 Long term (current) use of aspirin: Secondary | ICD-10-CM | POA: Diagnosis not present

## 2024-08-05 DIAGNOSIS — I110 Hypertensive heart disease with heart failure: Secondary | ICD-10-CM | POA: Diagnosis not present

## 2024-08-05 DIAGNOSIS — R7401 Elevation of levels of liver transaminase levels: Secondary | ICD-10-CM | POA: Diagnosis not present

## 2024-08-05 DIAGNOSIS — Z91148 Patient's other noncompliance with medication regimen for other reason: Secondary | ICD-10-CM | POA: Insufficient documentation

## 2024-08-05 LAB — VITAMIN B12: Vitamin B-12: 254 pg/mL (ref 180–914)

## 2024-08-05 LAB — BASIC METABOLIC PANEL WITH GFR
Anion gap: 13 (ref 5–15)
BUN: 14 mg/dL (ref 8–23)
CO2: 29 mmol/L (ref 22–32)
Calcium: 9.3 mg/dL (ref 8.9–10.3)
Chloride: 95 mmol/L — ABNORMAL LOW (ref 98–111)
Creatinine, Ser: 1.62 mg/dL — ABNORMAL HIGH (ref 0.61–1.24)
GFR, Estimated: 48 mL/min — ABNORMAL LOW (ref >60)
Glucose, Bld: 80 mg/dL (ref 70–99)
Potassium: 2.6 mmol/L — CL (ref 3.5–5.1)
Sodium: 137 mmol/L (ref 135–145)

## 2024-08-05 LAB — BLOOD GAS, VENOUS
Acid-Base Excess: 11.9 mmol/L — ABNORMAL HIGH (ref 0.0–2.0)
Bicarbonate: 38.2 mmol/L — ABNORMAL HIGH (ref 20.0–28.0)
Drawn by: 1470
O2 Saturation: 38.3 %
Patient temperature: 36.8
pCO2, Ven: 54 mmHg (ref 44–60)
pH, Ven: 7.45 — ABNORMAL HIGH (ref 7.25–7.43)
pO2, Ven: <31 mmHg — CL (ref 32–45)

## 2024-08-05 LAB — LIPID PANEL
Cholesterol: 134 mg/dL (ref 0–200)
HDL: 28 mg/dL — ABNORMAL LOW (ref >40)
LDL Cholesterol: 91 mg/dL (ref 0–99)
Total CHOL/HDL Ratio: 4.8 ratio
Triglycerides: 76 mg/dL (ref <150)
VLDL: 15 mg/dL (ref 0–40)

## 2024-08-05 LAB — COMPREHENSIVE METABOLIC PANEL WITH GFR
ALT: 71 U/L — ABNORMAL HIGH (ref 0–44)
AST: 74 U/L — ABNORMAL HIGH (ref 15–41)
Albumin: 3.7 g/dL (ref 3.5–5.0)
Alkaline Phosphatase: 113 U/L (ref 38–126)
Anion gap: 14 (ref 5–15)
BUN: 12 mg/dL (ref 8–23)
CO2: 28 mmol/L (ref 22–32)
Calcium: 9.7 mg/dL (ref 8.9–10.3)
Chloride: 94 mmol/L — ABNORMAL LOW (ref 98–111)
Creatinine, Ser: 1.46 mg/dL — ABNORMAL HIGH (ref 0.61–1.24)
GFR, Estimated: 54 mL/min — ABNORMAL LOW (ref >60)
Glucose, Bld: 81 mg/dL (ref 70–99)
Potassium: 2.8 mmol/L — ABNORMAL LOW (ref 3.5–5.1)
Sodium: 136 mmol/L (ref 135–145)
Total Bilirubin: 2.2 mg/dL — ABNORMAL HIGH (ref 0.0–1.2)
Total Protein: 7.2 g/dL (ref 6.5–8.1)

## 2024-08-05 LAB — HIV ANTIBODY (ROUTINE TESTING W REFLEX): HIV Screen 4th Generation wRfx: NONREACTIVE

## 2024-08-05 LAB — CBC
HCT: 42 % (ref 39.0–52.0)
HCT: 43.5 % (ref 39.0–52.0)
Hemoglobin: 14 g/dL (ref 13.0–17.0)
Hemoglobin: 14.6 g/dL (ref 13.0–17.0)
MCH: 30.1 pg (ref 26.0–34.0)
MCH: 29.9 pg (ref 26.0–34.0)
MCHC: 33.3 g/dL (ref 30.0–36.0)
MCHC: 33.6 g/dL (ref 30.0–36.0)
MCV: 90.3 fL (ref 80.0–100.0)
MCV: 89.1 fL (ref 80.0–100.0)
Platelets: 237 K/uL (ref 150–400)
Platelets: 240 K/uL (ref 150–400)
RBC: 4.65 MIL/uL (ref 4.22–5.81)
RBC: 4.88 MIL/uL (ref 4.22–5.81)
RDW: 16.7 % — ABNORMAL HIGH (ref 11.5–15.5)
RDW: 16.5 % — ABNORMAL HIGH (ref 11.5–15.5)
WBC: 6.9 K/uL (ref 4.0–10.5)
WBC: 7.1 K/uL (ref 4.0–10.5)
nRBC: 0 % (ref 0.0–0.2)
nRBC: 0 % (ref 0.0–0.2)

## 2024-08-05 LAB — BRAIN NATRIURETIC PEPTIDE
B Natriuretic Peptide: 518.8 pg/mL — ABNORMAL HIGH (ref 0.0–100.0)
B Natriuretic Peptide: 356.7 pg/mL — ABNORMAL HIGH (ref 0.0–100.0)

## 2024-08-05 LAB — CREATININE, SERUM
Creatinine, Ser: 1.56 mg/dL — ABNORMAL HIGH (ref 0.61–1.24)
GFR, Estimated: 50 mL/min — ABNORMAL LOW (ref >60)

## 2024-08-05 LAB — MAGNESIUM: Magnesium: 1.8 mg/dL (ref 1.7–2.4)

## 2024-08-05 LAB — ETHANOL: Alcohol, Ethyl (B): <15 mg/dL (ref <15)

## 2024-08-05 LAB — AMMONIA: Ammonia: <13 umol/L (ref 9–35)

## 2024-08-05 LAB — TSH: TSH: 2.349 u[IU]/mL (ref 0.350–4.500)

## 2024-08-05 LAB — FOLATE: Folate: 19.2 ng/mL (ref >5.9)

## 2024-08-05 MED ORDER — ASPIRIN 81 MG PO TBEC
81.0000 mg | DELAYED_RELEASE_TABLET | Freq: Every day | ORAL | Status: DC
  Administered 2024-08-05 – 2024-08-06 (×4): 81 mg via ORAL
  Filled 2024-08-05 (×2): qty 1

## 2024-08-05 MED ORDER — LEVETIRACETAM 500 MG PO TABS
500.0000 mg | ORAL_TABLET | Freq: Two times a day (BID) | ORAL | Status: DC
  Administered 2024-08-05 – 2024-08-06 (×4): 500 mg via ORAL
  Filled 2024-08-05 (×2): qty 1

## 2024-08-05 MED ORDER — EZETIMIBE 10 MG PO TABS
10.0000 mg | ORAL_TABLET | Freq: Every day | ORAL | Status: DC
  Administered 2024-08-06 (×2): 10 mg via ORAL
  Filled 2024-08-05: qty 1

## 2024-08-05 MED ORDER — LORAZEPAM 2 MG/ML IJ SOLN: 1.0000 mg | INTRAMUSCULAR | Status: DC | PRN

## 2024-08-05 MED ORDER — ENOXAPARIN SODIUM 40 MG/0.4ML IJ SOSY
40.0000 mg | PREFILLED_SYRINGE | INTRAMUSCULAR | Status: DC
  Administered 2024-08-05 (×2): 40 mg via SUBCUTANEOUS
  Filled 2024-08-05: qty 0.4

## 2024-08-05 MED ORDER — ADULT MULTIVITAMIN W/MINERALS CH
1.0000 | ORAL_TABLET | Freq: Every day | ORAL | Status: DC
  Administered 2024-08-05 – 2024-08-06 (×4): 1 via ORAL
  Filled 2024-08-05 (×2): qty 1

## 2024-08-05 MED ORDER — THIAMINE HCL 100 MG/ML IJ SOLN: 100.0000 mg | Freq: Every day | INTRAMUSCULAR | Status: DC

## 2024-08-05 MED ORDER — POTASSIUM CHLORIDE CRYS ER 20 MEQ PO TBCR: 40.0000 meq | EXTENDED_RELEASE_TABLET | Freq: Every day | ORAL | 1 refills | Status: DC

## 2024-08-05 MED ORDER — LOSARTAN POTASSIUM 25 MG PO TABS
25.0000 mg | ORAL_TABLET | Freq: Every day | ORAL | Status: DC
  Administered 2024-08-06 (×2): 25 mg via ORAL
  Filled 2024-08-05: qty 1

## 2024-08-05 MED ORDER — LORAZEPAM 1 MG PO TABS: 0.0000 mg | ORAL_TABLET | Freq: Four times a day (QID) | ORAL | Status: DC

## 2024-08-05 MED ORDER — ACETAMINOPHEN 325 MG PO TABS: 650.0000 mg | ORAL_TABLET | Freq: Four times a day (QID) | ORAL | Status: DC | PRN

## 2024-08-05 MED ORDER — MAGNESIUM SULFATE IN D5W 1-5 GM/100ML-% IV SOLN
1.0000 g | Freq: Once | INTRAVENOUS | Status: AC
  Administered 2024-08-06 (×2): 1 g via INTRAVENOUS
  Filled 2024-08-05: qty 100

## 2024-08-05 MED ORDER — ATORVASTATIN CALCIUM 80 MG PO TABS
80.0000 mg | ORAL_TABLET | Freq: Every day | ORAL | Status: DC
  Administered 2024-08-06 (×2): 80 mg via ORAL
  Filled 2024-08-05: qty 1

## 2024-08-05 MED ORDER — FOLIC ACID 1 MG PO TABS
1.0000 mg | ORAL_TABLET | Freq: Every day | ORAL | Status: DC
  Administered 2024-08-05 – 2024-08-06 (×4): 1 mg via ORAL
  Filled 2024-08-05 (×2): qty 1

## 2024-08-05 MED ORDER — THIAMINE MONONITRATE 100 MG PO TABS: 100.0000 mg | ORAL_TABLET | Freq: Every day | ORAL | Status: DC

## 2024-08-05 MED ORDER — TORSEMIDE 20 MG PO TABS: 60.0000 mg | ORAL_TABLET | Freq: Every day | ORAL | 1 refills | Status: DC

## 2024-08-05 MED ORDER — SODIUM CHLORIDE 0.9 % IV SOLN: INTRAVENOUS | Status: DC | PRN

## 2024-08-05 MED ORDER — LORAZEPAM 1 MG PO TABS: 0.0000 mg | ORAL_TABLET | Freq: Two times a day (BID) | ORAL | Status: DC

## 2024-08-05 MED ORDER — LORAZEPAM 1 MG PO TABS: 1.0000 mg | ORAL_TABLET | ORAL | Status: DC | PRN

## 2024-08-05 MED ORDER — THIAMINE HCL 100 MG/ML IJ SOLN: 250.0000 mg | INTRAVENOUS | Status: DC

## 2024-08-05 MED ORDER — ACETAMINOPHEN 650 MG RE SUPP: 650.0000 mg | Freq: Four times a day (QID) | RECTAL | Status: DC | PRN

## 2024-08-05 MED ORDER — POTASSIUM CHLORIDE 10 MEQ/100ML IV SOLN
10.0000 meq | INTRAVENOUS | Status: AC
  Administered 2024-08-05 (×6): 10 meq via INTRAVENOUS
  Filled 2024-08-05 (×3): qty 100

## 2024-08-05 MED ORDER — POTASSIUM CHLORIDE CRYS ER 20 MEQ PO TBCR
40.0000 meq | EXTENDED_RELEASE_TABLET | Freq: Once | ORAL | Status: AC
  Administered 2024-08-05 (×2): 40 meq via ORAL
  Filled 2024-08-05: qty 2

## 2024-08-05 MED ORDER — POTASSIUM CHLORIDE 10 MEQ/100ML IV SOLN
10.0000 meq | INTRAVENOUS | Status: DC
  Administered 2024-08-05 (×2): 10 meq via INTRAVENOUS
  Filled 2024-08-05: qty 100

## 2024-08-05 MED ORDER — POTASSIUM CHLORIDE 10 MEQ/100ML IV SOLN: 10.0000 meq | INTRAVENOUS | Status: DC

## 2024-08-05 MED ORDER — THIAMINE HCL 100 MG/ML IJ SOLN
500.0000 mg | Freq: Three times a day (TID) | INTRAVENOUS | Status: DC
  Administered 2024-08-06 (×4): 500 mg via INTRAVENOUS
  Filled 2024-08-05 (×8): qty 5
  Filled 2024-08-05: qty 500

## 2024-08-05 NOTE — Progress Notes (Signed)
 Food bag provided to patient with non perishable items.

## 2024-08-05 NOTE — Progress Notes (Signed)
 ReDS Vest / Clip - 08/05/24 1138       ReDS Vest / Clip   Station Marker C    Ruler Value 26    ReDS Value Range Moderate volume overload    ReDS Actual Value 37

## 2024-08-05 NOTE — Progress Notes (Signed)
 ADVANCED HF CLINIC NOTE   PCP: Haze Servant, NP HF Cardiologist: Dr Cherrie   Reason for Visit: f/u for systolic heart failure   HPI: [name] is a 63 y.o. male with h/o ETOH abuse, VF Arrest 2018, CAD s/p CABG 12/07/17, and ICM with EF 25-30%.   Admitted 12/18 with VF arrest while chopping wood. Received CPR in the field. R/LHC with severe 3vD as below and cardiogenic shock requiring IABP and milrinone . Required milrinone  with continued shock. Pt improved and IABP removed 12/10, but pt had recurrent VF arrest so IABP replaced. Stabilized and underwent CABG 12/07/17. Tolerated gradual wean of meds and extubation with no further events.  Echo 5/20 EF at 25%.    9/20 implantation of a [employer] single chamber ICD.   Routine clinic f/u on 10/23/19 and while in exam room had a witnessed seizure with loss of pulse. CPR started with quick ROSC. He did not require shock. Taken to the ED and had another seizure. Neurology consulted. Started on Keppra . ICD interrogated. No arrhythmias noted. Admitted to [facility] on 12/09/19 with recurrent seizures in setting of noncompliance with his Keppra .    Admitted 8/21 with COVID PNA.   Follow up 5/22 he was having facial and arm tingling, no other neuro symptoms. Ethanol level 145. Carvedilol  increased and carotid u/s ordered showing bilateral ICA 1-39%.  Follow up 9/22, he was off all meds. Resumed meds and re-enrolled with Paramedicine. Follow up 10/22, he remained off all meds, NYHA II. Meds restarted, but lost to follow up.   Follow up 09/11/22, off all meds except Keppra , felt bad and volume overloaded.  Attempted outpatient diuresis and titration of GDMT but admitted in 10/23 d/t a/c CHF.   Seen for f/u 12/12/22. Had been in [place] ED for belly pain and swelling. Says they gave him an Advil and sent him home. No records to review. Continued to endorse abdominal pain at f/u. Lipase, Tbili, indirect bilirubin and direct  bilirubin elevated. He was referred to GI for workup. Also appeared volume overloaded and IV lasix  arranged in infusion clinic.   Had f/u here in the [facility] 2/14 and was volume overloaded w/ NYHA IIIb symptoms. HL score 27. Continued abdominal swelling. Lasix  was discontinued and he was started on torsemide  80 mg daily. Was instructed to return back for f/u but failed to do so.   Recently admitted 6/25 w/ a/c CHF, in the setting of med nonadherence. Had reportedly been out of meds x 1 month. Echo EF less than 20%, moderately reduced RV. Diuresed w/ IV Lasix  and started back on HF GDMT.  D/c wt 146 lb.   He presents today for f/u. Poor historian, difficult to re-direct. He says he took torsemide  post d/c but ran out. He claims to be out of all of his other meds. Says he has not taken any meds in the last 30 days. His wt today is 3 lb up from d/c wt at 149 lb. BP mildly elevated at 140/86. Denies resting dyspnea but reports NYHA Class IIb-III symtoms + bendopnea. Also mild LEE R>L. Denies CP. Denies ICD shocks. No recent seizures. He does report issues w/ muscle cramps.   We attempted device interrogation but transmitter currently not working.   ReDs elevated at 37%, abnormal.   Denies tobacco or drug use. Still drinking beer, reports 1-2 every few days, though I suspect he is probably drinking more than this.    ROS: All systems negative except as listed  in HPI, PMH and Problem List.  SH:  Social History   Socioeconomic History   Marital status: Divorced    Spouse name: Not on file   Number of children: Not on file   Years of education: Not on file   Highest education level: Not on file  Occupational History   Not on file  Tobacco Use   Smoking status: Never   Smokeless tobacco: Never  Vaping Use   Vaping status: Never Used  Substance and Sexual Activity   Alcohol use: Yes    Alcohol/week: 14.0 standard drinks of alcohol    Types: 14 Cans of beer per week    Comment: patient  endorses at least 1-2 beers daily (04/2020); 03/2023   Drug use: No   Sexual activity: Not Currently  Other Topics Concern   Not on file  Social History Narrative   Lives at home with his brother who is crippled. He takes care of his brother.    Right handed    Social Drivers of Health   Financial Resource Strain: High Risk (02/15/2023)   Overall Financial Resource Strain (CARDIA)    Difficulty of Paying Living Expenses: Very hard  Food Insecurity: No Food Insecurity (06/13/2024)   Hunger Vital Sign    Worried About Running Out of Food in the Last Year: Never true    Ran Out of Food in the Last Year: Never true  Transportation Needs: No Transportation Needs (06/13/2024)   PRAPARE - Administrator, Civil Service (Medical): No    Lack of Transportation (Non-Medical): No  Physical Activity: Not on file  Stress: Not on file  Social Connections: Not on file  Intimate Partner Violence: Not At Risk (06/13/2024)   Humiliation, Afraid, Rape, and Kick questionnaire    Fear of Current or Ex-Partner: No    Emotionally Abused: No    Physically Abused: No    Sexually Abused: No   FH:  Family History  Problem Relation Age of Onset   Alzheimer's disease Mother    Lung disease Father    Heart attack Other    Diabetes Neg Hx    Colon cancer Neg Hx    Esophageal cancer Neg Hx    Pancreatic cancer Neg Hx    Stomach cancer Neg Hx    Past Medical History:  Diagnosis Date   AICD (automatic cardioverter/defibrillator) present    Alcohol abuse 05/18/2020   Arthritis    hands; maybe my toes (12/20/2017)   CAD (coronary artery disease)    Cardiac arrest (HCC) 11/29/2017   hx VF arrest/notes 12/20/2017   CHF (congestive heart failure) (HCC)    Chronic systolic heart failure (HCC) 08/25/2019   Coronary artery disease    Coronary artery disease involving native coronary artery of native heart without angina pectoris 12/07/2017   Essential hypertension 05/18/2020   GERD  (gastroesophageal reflux disease)    H/O ETOH abuse    /notes 12/20/2017   High cholesterol    Hypertension    Ischemic cardiomyopathy    a. 08/2019 s/p BSX D232 single lead AICD.   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) 12/30/2019   Syncope and collapse    last few days (12/20/2017)   Current Outpatient Medications  Medication Sig Dispense Refill  ASPIRIN  LOW DOSE 81 MG tablet TAKE 1 TABLET (81 MG TOTAL) BY MOUTH DAILY. SWALLOW WHOLE. (AM) 30 tablet 11   atorvastatin  (LIPITOR ) 80 MG tablet Take 1 tablet (80 mg total) by mouth daily. 30 tablet 1   empagliflozin  (JARDIANCE ) 10 MG TABS tablet Take 1 tablet (10 mg total) by mouth daily. 30 tablet 1   ezetimibe  (ZETIA ) 10 MG tablet Take 1 tablet (10 mg total) by mouth daily. 90 tablet 1   levETIRAcetam  (KEPPRA ) 500 MG tablet Take 1 tablet (500 mg total) by mouth 2 (two) times daily. 120 tablet 1   losartan  (COZAAR ) 25 MG tablet Take 1 tablet (25 mg total) by mouth daily. 90 tablet 1   potassium chloride  SA (KLOR-CON  M20) 20 MEQ tablet Take 2 tablets (40 mEq total) by mouth daily. 60 tablet 1   torsemide  (DEMADEX ) 20 MG tablet Take 3 tablets (60 mg total) by mouth daily. 90 tablet 1   No current facility-administered medications for this encounter.   BP (!) 140/86   Pulse 82   Ht 5' 11 (1.803 m)   Wt 67.6 kg (149 lb)   SpO2 91%   BMI 20.78 kg/m   Wt Readings from Last 3 Encounters:  08/05/24 67.6 kg (149 lb)  06/16/24 66.5 kg (146 lb 9.6 oz)  01/14/24 67.9 kg (149 lb 12.8 oz)   PHYSICAL EXAM: ReDs 37%, abnormal  General: disheveled appearing. NAD  HEENT: normal Neck: supple. JVD ~8-9 cm. Carotids 2+ bilat; no bruits. No lymphadenopathy or thyromegaly appreciated. Cor: PMI nondisplaced. Regular rate & rhythm. No  rubs, gallops or murmurs. Lungs: clear Abdomen: soft, nontender, nondistended. No hepatosplenomegaly. No bruits or masses. Good bowel sounds. Extremities: no cyanosis, clubbing, rash, trace ankle edema  Neuro: alert & oriented x 3, cranial nerves grossly intact. moves all 4 extremities w/o difficulty. Affect pleasant.   ASSESSMENT & PLAN: 1. Chronic Biventricular HF, ICM  - Echo 11/30/17 25-30% RV normal.   - Echo 03/2018: EF 40-45%  RV normal.  - Echo 5/20 EF ~ 25%. Has ICD.  - Echo 12/2019 EF 30-35% RV normal. - Mixed Ischemic + probable ETOH mediated component  - Echo (10/23): biventricular failure getting worse from LVEF 30-35% Mod RV--> LVEF < 20% severely reduced RV.  - Echo (6/25): EF < 20%, RV moderately reduced  - Has [employer] ICD. Attempted device interrogation but clinic transmitter not working correctly. Unable to obtain. - NYHA IIb-III. Mild volume overload on exam and by ReDs, 37%. In the setting of poor med compliance. Ran out of meds  - refill diuretics/HF GDMT  - restart Torsemide  60 mg daily  - restart Jardiance  10 mg daily  - restart losartan  25 mg daily - Now off digoxin  d/t elevated level and poor compliance  - No spiro with h/o gynecomastia. Could consider Inspra  but would avoid given h/o poor compliance  - hold off no on restarting ? blocker until better diuresed  - Not a candidate for advanced therapies/transplant with ongoing ETOH/substance abuse and compliance (this includes home inotrope).  - check CMP and BNP    2. CAD - S/P CABG x 3 2018  - denies CP  - out of meds, - restart ASA, statin and Zeitia. Check HFTs and Lipid panel  - Continue ASA 81 mg  - Continue Lipitor  80 mg.  - Continue Zetia  10 mg daily    3. Seizure Disorder - previously prescribed Keppra  but reports being out of meds  - he denies any recent  seizures  - needs to f/u w/ outpatient neurology for Keppra  refills.    4. ETOH Abuse - says he is only drinking 1-2 beers every few  days. History questionable. I suspect he is drinking more - cessation advised   5. SDO - followed by HF SW team. Needs help with food and transportation.  - he was given food pantry bag and transport arranged to take back home   F[facility]u in the [facility] in 3-4 wks to reassess volume status and check compliance    Caffie Shed, PA-C  08/05/24

## 2024-08-05 NOTE — Progress Notes (Signed)
 Date and time results received: 08/05/24 2008 (use smartphrase .now to insert current time)  Test: Venous Blood Gas  Critical Value: O2 <31  Name of Provider Notified: Dr. Franky  Orders Received? Or Actions Taken?: No new orders received.

## 2024-08-05 NOTE — ED Provider Triage Note (Signed)
 Emergency Medicine Provider Triage Evaluation Note  [name] , a 63 y.o. male  was evaluated in triage.  Pt complains of low potassium from heart failure clinic. Reports feeling generally feeling poor, hard to think straight and worsening swelling throughout. Denies chest pain. Generalized pain throughout.  Review of Systems  Positive: Shob Negative:   Physical Exam  BP 119/88 (BP Location: Right Arm)   Pulse 79   Temp 97.6 F (36.4 C)   Resp 18   SpO2 96%  Gen:   Awake, no distress   Resp:  Normal effort  MSK:   Moves extremities without difficulty  Other:    Medical Decision Making  Medically screening exam initiated at 2:10 PM.  Appropriate orders placed.  Add Dinapoli was informed that the remainder of the evaluation will be completed by another provider, this initial triage assessment does not replace that evaluation, and the importance of remaining in the ED until their evaluation is complete.  Workup initiated in triage    Rosan Sherlean DEL, [place] 08/05/24 1411

## 2024-08-05 NOTE — ED Triage Notes (Signed)
 Pt was brought in by [facility] Heart Failure RN for abnormal potassium 2.7  Pt denies pain at this time

## 2024-08-05 NOTE — ED Notes (Signed)
 CCMD called. Pt placed on monitor.

## 2024-08-05 NOTE — Addendum Note (Signed)
 Encounter addended by: Tita Andriette NOVAK, RN on: 08/05/2024 1:39 PM  Actions taken: Pharmacy for encounter modified, Order list changed, Diagnosis association updated, Clinical Note Signed

## 2024-08-05 NOTE — Patient Instructions (Addendum)
 Medication Changes:  No Changes In Medications at this time.  Lab Work:  Labs done today, your results will be available in MyChart, we will contact you for abnormal readings.  PLEASE RETURN FOR LABS AS SCHEDULED IN 1 WEEK   Follow-Up in: 1 MONTH AS SCHEDULED   At the [facility], you and your health needs are our priority. We have a designated team specialized in the treatment of Heart Failure. This Care Team includes your primary Heart Failure Specialized Cardiologist (physician), Advanced Practice Providers (APPs- Physician Assistants and Nurse Practitioners), and Pharmacist who all work together to provide you with the care you need, when you need it.   You may see any of the following providers on your designated Care Team at your next follow up:  Dr. Toribio Fuel Dr. Ezra Shuck Dr. Ria Commander Dr. Odis Brownie Greig Mosses, NP Caffie Shed, [place] [facility] [place], [place] Beckey Coe, NP [place] Lee, NP Tinnie Redman, PharmD   Please be sure to bring in all your medications bottles to every appointment.   Need to Contact Us :  If you have any questions or concerns before your next appointment please send us  a message through [place] or call our office at [phone].    TO LEAVE A MESSAGE FOR THE NURSE SELECT OPTION 2, PLEASE LEAVE A MESSAGE INCLUDING: YOUR NAME DATE OF BIRTH CALL BACK NUMBER REASON FOR CALL**this is important as we prioritize the call backs  YOU WILL RECEIVE A CALL BACK THE SAME DAY AS LONG AS YOU CALL BEFORE 4:00 PM

## 2024-08-05 NOTE — ED Notes (Signed)
 Pt gives verbal consent for mse

## 2024-08-05 NOTE — H&P (Addendum)
 History and Physical    [name] FMW:[id number] DOB: 24-May-1961 DOA: 08/05/2024  PCP: Pcp, No  Patient coming from: cardiology clinic  I have personally briefly reviewed patient's old medical records in [facility]  Chief Complaint: hypokalemia  HPI: [name] is Brynna [name] 63 y.o. male with medical history significant of HFrEF, seizure disorder on keppra , alcohol abuse, CAD s/p CABG, HTN, hx cardiac arrest, and multiple other medical issues here after being seen in cardiology clinic and being found to have hypokalemia.  History is obtained from chart review and discussion with EDP.  Patient is hard to keep on track, difficult to tell whether this is his baseline (no one available for collateral).  He was seen in cardiology clinic today.  He reported running out of all of his meds and not taking any meds in 30 days.  He c/o muscle cramps.  Also noted generally feeling poor, difficulty thinking straight, and swelling.  C/o generalized discomfort to triage.  He notes his last drink was 2 days ago and he drinks every few days (whenever someone gives him something).  Denies smoking or other drugs.  ED Course: labs, imaging.  Admit to hospitalist for hypokalemia.  EDP to consult cardiology as well.     Review of Systems: As per HPI otherwise all other systems reviewed and are negative.  Past Medical History:  Diagnosis Date   AICD (automatic cardioverter/defibrillator) present    Alcohol abuse 05/18/2020   Arthritis    hands; maybe my toes (12/20/2017)   CAD (coronary artery disease)    Cardiac arrest (HCC) 11/29/2017   hx VF arrest/notes 12/20/2017   CHF (congestive heart failure) (HCC)    Chronic systolic heart failure (HCC) 08/25/2019   Coronary artery disease    Coronary artery disease involving native coronary artery of native heart without angina pectoris 12/07/2017   Essential hypertension 05/18/2020   GERD (gastroesophageal reflux disease)    H/O ETOH abuse     /notes 12/20/2017   High cholesterol    Hypertension    Ischemic cardiomyopathy    [name]. 08/2019 s/p BSX D232 single lead AICD.   Mixed hyperlipidemia 05/18/2020   Seizure (HCC) 10/21/2019   Seizures (HCC) ~ 2016; ~ 2017   I've had [name] couple; I was at work (12/20/2017)   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, 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) 12/30/2019   Syncope and collapse    last few days (12/20/2017)    Past Surgical History:  Procedure Laterality Date   CARDIAC CATHETERIZATION     CORONARY ARTERY BYPASS GRAFT N/[name] 12/07/2017   Procedure: CORONARY ARTERY BYPASS GRAFTING (CABG) times four using left internal mammary artery and right saphenous vein using endoscope for harvest.;  Surgeon: Fleeta Hanford Coy, [name];  Location: [facility] OR;  Service: Open Heart Surgery;  Laterality: N/Simra Fiebig;   I & D EXTREMITY Left 05/20/2020   Procedure: IRRIGATION AND DEBRIDEMENT OF WRIST;  Surgeon: Carolee Lynwood JINNY DOUGLAS, [name];  Location: [facility] OR;  Service: Orthopedics;  Laterality: Left;   IABP INSERTION N/[name] 11/29/2017   Procedure: IABP Insertion;  Surgeon: Jose Bruckner, [name];  Location: [facility] INVASIVE CV LAB;  Service: Cardiovascular;  Laterality: N/[name];   IABP INSERTION N/[name] 12/03/2017   Procedure: IABP INSERTION;  Surgeon: Jose Toribio SAUNDERS, [name];  Location: [facility] INVASIVE CV LAB;  Service: Cardiovascular;  Laterality: N/[name];   ICD IMPLANT N/[name] 08/29/2019   Procedure: ICD IMPLANT;  Surgeon: Waddell Danelle ORN, [name];  [facility] INVASIVE  CV LAB;  Service: Cardiovascular;  Laterality: N/Pamelyn Bancroft;   LEFT HEART CATH AND CORONARY ANGIOGRAPHY N/[name] 11/29/2017   Procedure: LEFT HEART CATH AND CORONARY ANGIOGRAPHY;  Surgeon: Jose Bruckner, [name];  Location: [facility] INVASIVE CV LAB;  Service: Cardiovascular;  Laterality: [name];   RIGHT HEART CATH N/[name] 11/29/2017   Procedure: RIGHT HEART CATH;  Surgeon: Jose Toribio SAUNDERS, [name];  Location: [facility] INVASIVE CV LAB;  Service: Cardiovascular;  Laterality: N/[name];   TEE WITHOUT CARDIOVERSION  [name] 12/07/2017   Procedure: TRANSESOPHAGEAL ECHOCARDIOGRAM (TEE);  Surgeon: Fleeta Ochoa, [name];  Location: [facility] OR;  Service: Open Heart Surgery;  Laterality: N/[name];   TONSILLECTOMY AND ADENOIDECTOMY  ~ 10    Social History  reports that he has never smoked. He has never used smokeless tobacco. He reports that he does not currently use alcohol after [name] past usage of about 14.0 standard drinks of alcohol per week. He reports that he does not use drugs.  Allergies  Allergen Reactions   Aldactone  [Spironolactone ] Other (See Comments)    Gynecomastia     Family History  Problem Relation Age of Onset   Alzheimer's disease Mother    Lung disease Father    Heart attack Other    Diabetes Neg Hx    Colon cancer Neg Hx    Esophageal cancer Neg Hx    Pancreatic cancer Neg Hx    Stomach cancer Neg Hx    Prior to Admission medications   Medication Sig Start Date End Date Taking? Authorizing Provider  ASPIRIN  LOW DOSE 81 MG tablet TAKE 1 TABLET (81 MG TOTAL) BY MOUTH DAILY. SWALLOW WHOLE. (AM) 10/05/23   Milford, Harlene HERO, FNP  atorvastatin  (LIPITOR ) 80 MG tablet Take 1 tablet (80 mg total) by mouth daily. 06/16/24   Jose Frames, [name]  empagliflozin  (JARDIANCE ) 10 MG TABS tablet Take 1 tablet (10 mg total) by mouth daily. 06/16/24   Jose Frames, [name]  ezetimibe  (ZETIA ) 10 MG tablet Take 1 tablet (10 mg total) by mouth daily. 06/16/24 09/14/24  Jose Frames, [name]  levETIRAcetam  (KEPPRA ) 500 MG tablet Take 1 tablet (500 mg total) by mouth 2 (two) times daily. 06/16/24   Jose Frames, [name]  losartan  (COZAAR ) 25 MG tablet Take 1 tablet (25 mg total) by mouth daily. 06/16/24   Jose Frames, [name]  potassium chloride  SA (KLOR-CON  M20) 20 MEQ tablet Take 2 tablets (40 mEq total) by mouth daily. 08/05/24   [name]  torsemide  (DEMADEX ) 20 MG tablet Take 3 tablets (60 mg total) by mouth daily. 08/05/24   [name] HERO, [name]    Physical Exam: Vitals:   08/05/24 1356 08/05/24  1650  BP: 119/88 112/82  Pulse: 79 74  Resp: 18 19  Temp: 97.6 F (36.4 C)   SpO2: 96% 100%    Constitutional: NAD, calm, comfortable Vitals:   08/05/24 1356 08/05/24 1650  BP: 119/88 112/82  Pulse: 79 74  Resp: 18 19  Temp: 97.6 F (36.4 C)   SpO2: 96% 100%   Eyes: PERRL, lids and conjunctivae normal ENMT: Mucous membranes are moist Neck: normal, supple Respiratory: clear to auscultation bilaterally, no wheezing, no crackles.  Cardiovascular: RRR Abdomen: distended, nontender Musculoskeletal: no clubbing / cyanosis. No joint deformity upper and lower extremities. Good ROM, no contractures. Normal muscle tone.  Skin: pale no rashes, lesions, ulcers. No induration Neurologic: CN 2-12 intact. 5/5 strength to upper and lowers.  FNF and H2S intact.  Swaying, jerking movements at times, lets his head slump.SABRA  Psychiatric: odd affect   Labs on Admission: I have personally reviewed following labs and imaging studies  CBC: Recent Labs  Lab 08/05/24 1427  WBC 6.9  HGB 14.0  HCT 42.0  MCV 90.3  PLT 237    Basic Metabolic Panel: Recent Labs  Lab 08/05/24 1149 08/05/24 1427 08/05/24 1718  NA 136 137  --   K 2.8* 2.6*  --   CL 94* 95*  --   CO2 28 29  --   GLUCOSE 81 80  --   BUN 12 14  --   CREATININE 1.46* 1.62*  --   CALCIUM  9.7 9.3  --   MG  --   --  1.8    GFR: Estimated Creatinine Clearance: 45.8 mL/min (Almarie Kurdziel) (by C-G formula based on SCr of 1.62 mg/dL (H)).  Liver Function Tests: Recent Labs  Lab 08/05/24 1149  AST 74*  ALT 71*  ALKPHOS 113  BILITOT 2.2*  PROT 7.2  ALBUMIN  3.7    Urine analysis:    Component Value Date/Time   COLORURINE COLORLESS (Elea Holtzclaw) 01/08/2020 1922   APPEARANCEUR CLEAR 01/08/2020 1922   LABSPEC 1.002 (L) 01/08/2020 1922   PHURINE 7.0 01/08/2020 1922   GLUCOSEU NEGATIVE 01/08/2020 1922   HGBUR NEGATIVE 01/08/2020 1922   BILIRUBINUR NEGATIVE 01/08/2020 1922   KETONESUR NEGATIVE 01/08/2020 1922   PROTEINUR NEGATIVE 01/08/2020  1922   NITRITE NEGATIVE 01/08/2020 1922   LEUKOCYTESUR NEGATIVE 01/08/2020 1922    Radiological Exams on Admission: DG Chest 1 View Result Date: 08/05/2024 CLINICAL DATA:  141880 SOB (shortness of breath) 141880. EXAM: CHEST  1 VIEW COMPARISON:  06/12/2024. FINDINGS: Bilateral lung fields are clear. Bilateral costophrenic angles are clear. Stable cardio-mediastinal silhouette. There are surgical staples along the heart border and sternotomy wires, status post CABG (coronary artery bypass graft). There is Desman Polak left sided ICD device. No acute osseous abnormalities. The soft tissues are within normal limits. IMPRESSION: No active disease. Electronically Signed   By: Ree Molt M.D.   On: 08/05/2024 15:01    EKG: Independently reviewed. Sinus, chronic LBBB   Assessment/Plan Principal Problem:   Hypokalemia    Assessment and Plan:  Hypokalemia Replace and follow Follow magnesium   Goal mag >2, k >4  Acute Metabolic Encephalopathy Odd affect, not clear what his baseline is (called both emergency contacts, no answer) Alert and oriented, but swaying his head/letting his head slump/jerky movements, tangential speech  No focal deficits on exam He notes last drink was 2 days ago Urine tox High dose thiamine  B12, folate, VBG, ammonia, TSH, RPR Follow head CT   Alcohol Abuse He notes his last drink 2 days ago, denies daily drinking (unclear how reliable he is) CIWA  High dose thiamine    HFrEF S/p ICD Echo 05/2024 with EF <20%, RVSF moderately reduced Seen in HF clinic today - he'd apparently run out of meds They were planning to resume his torsemide , jardiance , losartan  Will hold diuretics today with his hypokalemia (hold torsemide , hold jardiance ), will start losartan  Cards has been c/s by ED  Elevated LFT's Mild, will trend   CAD s/p CABG 2018 Will continue ASA, statin, zetia  (these were restarted in HF clinic today)  Seizure Disorder Keppra   He's coming from cards clinic  where they've refilled most of his cardiac meds.  Appears he needs keppra  prescription.  Will need to follow med rec.      DVT prophylaxis: lovenox   Code Status:   full  Family Communication:  None - called listed  brother and friend, no answer  Disposition Plan:   Patient is from:  home  Anticipated DC to:  Pending improvement  Anticipated DC date:  1-2 days  Anticipated DC barriers: Pending improvement  Consults called:  Cardiology (EDP)  Admission status:  obs   Severity of Illness: The appropriate patient status for this patient is OBSERVATION. Observation status is judged to be reasonable and necessary in order to provide the required intensity of service to ensure the patient's safety. The patient's presenting symptoms, physical exam findings, and initial radiographic and laboratory data in the context of their medical condition is felt to place them at decreased risk for further clinical deterioration. Furthermore, it is anticipated that the patient will be medically stable for discharge from the hospital within 2 midnights of admission.     Meliton Monte [name] [facility]  How to contact the [facility] Attending or Consulting provider 7A - 7P or covering provider during after hours 7P -7A, for this patient?   Check the care team in [facility] and look for Sahory Nordling) attending/consulting [facility] provider listed and b) the [facility] team listed Log into [email or web address] and use [facility] universal password to access. If you do not have the password, please contact the hospital operator. Locate the [facility] provider you are looking for under [facility] and page to Camilia Caywood number that you can be directly reached. If you still have difficulty reaching the provider, please page the [facility] (Director on Call) for the Hospitalists listed on amion for assistance.  08/05/2024, 5:55 PM

## 2024-08-05 NOTE — ED Provider Notes (Signed)
 [name] EMERGENCY DEPARTMENT AT [facility] Provider Note   CSN: [id number] Arrival date & time: 08/05/24  1349     Patient presents with: Abnormal Lab   [name] a 63 y.o. male.   Patient sent here by cardiology clinic for hypokalemia volume overload.  Potassium was 2.7.  He has not been compliant with his medications.  Has been in the his medications for the last couple weeks.  Continues to drink alcohol.  Denies any chest pain.  He has some shortness of breath with exertion.  States that he [name] having increased welling in his legs.  Will not really tell me when his last drink was.  He has been having some muscle spasms.  The history [name] provided by the patient.       Prior to Admission medications   Medication Sig Start Date End Date Taking? Authorizing Provider  ASPIRIN  LOW DOSE 81 MG tablet TAKE 1 TABLET (81 MG TOTAL) BY MOUTH DAILY. SWALLOW WHOLE. (AM) 10/05/23   Milford, Harlene HERO, FNP  atorvastatin  (LIPITOR ) 80 MG tablet Take 1 tablet (80 mg total) by mouth daily. 06/16/24   Fairy Frames, MD  empagliflozin  (JARDIANCE ) 10 MG TABS tablet Take 1 tablet (10 mg total) by mouth daily. 06/16/24   Fairy Frames, MD  ezetimibe  (ZETIA ) 10 MG tablet Take 1 tablet (10 mg total) by mouth daily. 06/16/24 09/14/24  Fairy Frames, MD  levETIRAcetam  (KEPPRA ) 500 MG tablet Take 1 tablet (500 mg total) by mouth 2 (two) times daily. 06/16/24   Fairy Frames, MD  losartan  (COZAAR ) 25 MG tablet Take 1 tablet (25 mg total) by mouth daily. 06/16/24   Fairy Frames, MD  potassium chloride  SA (KLOR-CON  M20) 20 MEQ tablet Take 2 tablets (40 mEq total) by mouth daily. 08/05/24   Marcine Caffie HERO, PA-C  torsemide  (DEMADEX ) 20 MG tablet Take 3 tablets (60 mg total) by mouth daily. 08/05/24   Marcine Caffie HERO, PA-C    Allergies: Aldactone  [spironolactone ]    Review of Systems  Updated Vital Signs BP 112/82   Pulse 74   Temp 97.6 F (36.4 C)   Resp 19   SpO2  100%   Physical Exam Vitals and nursing note reviewed.  Constitutional:      General: He [name] not in acute distress.    Appearance: He [name] well-developed.  HENT:     Head: Normocephalic and atraumatic.  Eyes:     Conjunctiva/sclera: Conjunctivae normal.     Pupils: Pupils are equal, round, and reactive to light.  Cardiovascular:     Rate and Rhythm: Normal rate and regular rhythm.     Pulses: Normal pulses.     Heart sounds: No murmur heard. Pulmonary:     Effort: Pulmonary effort [name] normal. No respiratory distress.     Breath sounds: Normal breath sounds.  Abdominal:     Palpations: Abdomen [name] soft.     Tenderness: There [name] no abdominal tenderness.  Musculoskeletal:        General: No swelling.     Cervical back: Neck supple.     Right lower leg: Edema present.     Left lower leg: Edema present.  Skin:    General: Skin [name] warm and dry.     Capillary Refill: Capillary refill takes less than 2 seconds.     Coloration: Skin [name] not pale.  Neurological:     Mental Status: He [name] alert.  Psychiatric:  Mood and Affect: Mood normal.     (all labs ordered are listed, but only abnormal results are displayed) Labs Reviewed  CBC - Abnormal; Notable for the following components:      Result Value   RDW 16.7 (*)    All other components within normal limits  BASIC METABOLIC PANEL WITH GFR - Abnormal; Notable for the following components:   Potassium 2.6 (*)    Chloride 95 (*)    Creatinine, Ser 1.62 (*)    GFR, Estimated 48 (*)    All other components within normal limits  BRAIN NATRIURETIC PEPTIDE - Abnormal; Notable for the following components:   B Natriuretic Peptide 356.7 (*)    All other components within normal limits  MAGNESIUM   ETHANOL    EKG: EKG Interpretation Date/Time:  Tuesday August 05 2024 14:37:21 EDT Ventricular Rate:  78 PR Interval:  114 QRS Duration:  174 QT Interval:  460 QTC Calculation: 524 R Axis:   92  Text Interpretation: Sinus rhythm  with occasional Premature ventricular complexes Possible Left atrial enlargement Rightward axis Left bundle branch block Abnormal ECG When compared with ECG of 05-Aug-2024 11:56, PREVIOUS ECG [name] PRESENT Confirmed by Ruthe Cornet [phone]) on 08/05/2024 4:21:36 PM  Radiology: [facility] Chest 1 View Result Date: 08/05/2024 CLINICAL DATA:  141880 SOB (shortness of breath) 141880. EXAM: CHEST  1 VIEW COMPARISON:  06/12/2024. FINDINGS: Bilateral lung fields are clear. Bilateral costophrenic angles are clear. Stable cardio-mediastinal silhouette. There are surgical staples along the heart border and sternotomy wires, status post CABG (coronary artery bypass graft). There [name] a left sided ICD device. No acute osseous abnormalities. The soft tissues are within normal limits. IMPRESSION: No active disease. Electronically Signed   By: Ree Molt M.D.   On: 08/05/2024 15:01     Procedures   Medications Ordered in the ED  potassium chloride  10 mEq in 100 mL IVPB (has no administration in time range)  potassium chloride  SA (KLOR-CON  M) CR tablet 40 mEq (has no administration in time range)                                    Medical Decision Making Amount and/or Complexity of Data Reviewed Labs: ordered.  Risk Prescription drug management. Decision regarding hospitalization.   [name] [name] here from cardiology clinic for admission for hypokalemia volume overload.  Normal vitals.  No fever.  Looks volume overloaded on exam.  Repeat potassium today [name] down to 2.6.  Creatinine mildly elevated at 1.6.  BNP [name] 400.  Otherwise lab works unremarkable.  EKG shows sinus rhythm.  No ischemic changes.  Chest x-ray with no signs of volume overload.  Ultimately she has got severe hypokalemia.  Concerning that he [name] been noncompliant with his medications.  Looks like he [name] volume overloaded as well.  Cardiology team has left recommendations for restarting his medications and diuresis.  Will replete him with IV  and p.o. potassium.  Will admit him to hospitalist for further care.  He [name] hemodynamically stable.  No signs of respiratory distress.  This chart was dictated using voice recognition software.  Despite best efforts to proofread,  errors can occur which can change the documentation meaning.      Final diagnoses:  Hypokalemia  Hypervolemia, unspecified hypervolemia type    ED Discharge Orders     None          Ruthe Cornet,  DO 08/05/24 1714

## 2024-08-06 ENCOUNTER — Encounter (HOSPITAL_COMMUNITY): Payer: Self-pay | Admitting: Family Medicine

## 2024-08-06 ENCOUNTER — Other Ambulatory Visit (HOSPITAL_COMMUNITY): Payer: Self-pay

## 2024-08-06 DIAGNOSIS — E876 Hypokalemia: Secondary | ICD-10-CM | POA: Diagnosis not present

## 2024-08-06 LAB — CBC
HCT: 40 % (ref 39.0–52.0)
Hemoglobin: 13.4 g/dL (ref 13.0–17.0)
MCH: 29.9 pg (ref 26.0–34.0)
MCHC: 33.5 g/dL (ref 30.0–36.0)
MCV: 89.3 fL (ref 80.0–100.0)
Platelets: 236 K/uL (ref 150–400)
RBC: 4.48 MIL/uL (ref 4.22–5.81)
RDW: 16.5 % — ABNORMAL HIGH (ref 11.5–15.5)
WBC: 6.9 K/uL (ref 4.0–10.5)
nRBC: 0 % (ref 0.0–0.2)

## 2024-08-06 LAB — COMPREHENSIVE METABOLIC PANEL WITH GFR
ALT: 57 U/L — ABNORMAL HIGH (ref 0–44)
AST: 49 U/L — ABNORMAL HIGH (ref 15–41)
Albumin: 3 g/dL — ABNORMAL LOW (ref 3.5–5.0)
Alkaline Phosphatase: 96 U/L (ref 38–126)
Anion gap: 10 (ref 5–15)
BUN: 18 mg/dL (ref 8–23)
CO2: 30 mmol/L (ref 22–32)
Calcium: 8.9 mg/dL (ref 8.9–10.3)
Chloride: 98 mmol/L (ref 98–111)
Creatinine, Ser: 1.57 mg/dL — ABNORMAL HIGH (ref 0.61–1.24)
GFR, Estimated: 50 mL/min — ABNORMAL LOW (ref >60)
Glucose, Bld: 92 mg/dL (ref 70–99)
Potassium: 3.2 mmol/L — ABNORMAL LOW (ref 3.5–5.1)
Sodium: 138 mmol/L (ref 135–145)
Total Bilirubin: 1.1 mg/dL (ref 0.0–1.2)
Total Protein: 6 g/dL — ABNORMAL LOW (ref 6.5–8.1)

## 2024-08-06 LAB — MRSA NEXT GEN BY PCR, NASAL: MRSA by PCR Next Gen: NOT DETECTED

## 2024-08-06 LAB — RPR: RPR Ser Ql: NONREACTIVE

## 2024-08-06 MED ORDER — LOSARTAN POTASSIUM 25 MG PO TABS
25.0000 mg | ORAL_TABLET | Freq: Every day | ORAL | 0 refills | Status: DC
  Filled 2024-08-06 (×2): qty 90, 90d supply, fill #0

## 2024-08-06 MED ORDER — POTASSIUM CHLORIDE CRYS ER 20 MEQ PO TBCR
40.0000 meq | EXTENDED_RELEASE_TABLET | Freq: Every day | ORAL | 0 refills | Status: DC
  Filled 2024-08-06 (×2): qty 60, 30d supply, fill #0

## 2024-08-06 MED ORDER — ATORVASTATIN CALCIUM 80 MG PO TABS
80.0000 mg | ORAL_TABLET | Freq: Every day | ORAL | 0 refills | Status: AC
  Filled 2024-08-06 (×2): qty 90, 90d supply, fill #0

## 2024-08-06 MED ORDER — LEVETIRACETAM 500 MG PO TABS
500.0000 mg | ORAL_TABLET | Freq: Two times a day (BID) | ORAL | 0 refills | Status: DC
  Filled 2024-08-06 (×2): qty 180, 90d supply, fill #0

## 2024-08-06 MED ORDER — MULTI-VITAMIN/MINERALS PO TABS
1.0000 | ORAL_TABLET | Freq: Every day | ORAL | 0 refills | Status: AC
  Filled 2024-08-06: qty 90, 90d supply, fill #0

## 2024-08-06 MED ORDER — EZETIMIBE 10 MG PO TABS
10.0000 mg | ORAL_TABLET | Freq: Every day | ORAL | 0 refills | Status: DC
  Filled 2024-08-06 (×2): qty 90, 90d supply, fill #0

## 2024-08-06 MED ORDER — METOPROLOL SUCCINATE ER 25 MG PO TB24
12.5000 mg | ORAL_TABLET | Freq: Every day | ORAL | 0 refills | Status: AC
  Filled 2024-08-06 (×2): qty 45, 90d supply, fill #0

## 2024-08-06 MED ORDER — EMPAGLIFLOZIN 10 MG PO TABS
10.0000 mg | ORAL_TABLET | Freq: Every day | ORAL | 0 refills | Status: AC
  Filled 2024-08-06 (×2): qty 90, 90d supply, fill #0

## 2024-08-06 MED ORDER — POTASSIUM CHLORIDE CRYS ER 20 MEQ PO TBCR
40.0000 meq | EXTENDED_RELEASE_TABLET | Freq: Once | ORAL | Status: AC
  Administered 2024-08-06 (×2): 40 meq via ORAL
  Filled 2024-08-06: qty 2

## 2024-08-06 MED ORDER — TORSEMIDE 20 MG PO TABS
60.0000 mg | ORAL_TABLET | Freq: Every day | ORAL | 0 refills | Status: AC
  Filled 2024-08-06 (×2): qty 270, 90d supply, fill #0

## 2024-08-06 NOTE — Progress Notes (Signed)
 H&V Care Navigation CSW Progress Note  Outpatient Heart Failure CSW very familiar with patient from [facility]- met with patient at bedside to check in and confirm best way to contact to help ensure compliance moving forward.  Patient has new home address now- updated in chart.  Was getting mail order medications but then moved to new address so unsure if medications stopped coming or if they were going to wrong location- states he knows who lives at his old address now and they did not receive medications.  Patient reports having a new phone [phone] which he shares with his signifcant other/roommate- number added to chart.  CSW will continue to follow in outpatient setting and assist as needed.  Patient is participating in a Managed Medicaid Plan:  Yes  SDOH Screenings   Food Insecurity: No Food Insecurity (08/06/2024)  Housing: High Risk (08/06/2024)  Transportation Needs: Unmet Transportation Needs (08/06/2024)  Utilities: Not At Risk (08/06/2024)  Depression (PHQ2-9): Medium Risk (11/08/2022)  Financial Resource Strain: High Risk (02/15/2023)  Tobacco Use: Low Risk  (08/06/2024)    Andriette HILARIO Leech, LCSW Clinical Social Worker [facility] Desk#: [phone] Cell#: [phone]

## 2024-08-06 NOTE — TOC CM/SW Note (Signed)
 Transition of Care [facility]) - Inpatient Brief Assessment   Patient Details  Name: Penn Grissett MRN: [id number] Date of Birth: [birth year]  Transition of Care [facility]) CM/SW Contact:    Luise JAYSON Pan, LCSWA Phone Number: 08/06/2024, 9:43 AM   Clinical Narrative: Patient states he is from home with his fiance, Sharlet, and that he will need help with transportation at discharge. Patient stated he has a cane at home. Patient reports that he has no prior [facility] or SNF hx. Patient stated he does not have a PCP but does have a cardiologist that he follows up with. Patient uses [facility] in [place].  CSW spoke with patient about substance use consult that was placed. Patient stated he only drinks beer and does not indulge in excessive drinking. Patient stated he went 3 weeks without anything PTA. CSW offered substance use resources, patient has declined them at this time. CSW addressed SDOH needs and placed resources on patients AVS.   RNCM and CSW will continue to follow as needed.    Transition of Care Asessment: Insurance and Status: Insurance coverage has been reviewed Patient has primary care physician: No Home environment has been reviewed: Home with fiance Prior level of function:: Indpendent Prior/Current Home Services: No current home services Social Drivers of Health Review: SDOH reviewed interventions complete Readmission risk has been reviewed: Yes Transition of care needs: transition of care needs identified, TOC will continue to follow

## 2024-08-06 NOTE — TOC Transition Note (Addendum)
 Transition of Care [facility]) - Discharge Note   Patient Details  Name: [name] MRN: [id number] Date of Birth: Apr 03, 1961  [facility] CM/SW Contact:  [name], RN Phone Number: 08/06/2024, 10:00 AM   Clinical Narrative:    For dc today, will need a cab in dc lounge and also needs a PCP.  Secretary will schedule a follow up apt for him.          Patient Goals and CMS Choice            Discharge Placement                       Discharge Plan and Services Additional resources added to the After Visit Summary for                                       Social Drivers of Health (SDOH) Interventions SDOH Screenings   Food Insecurity: No Food Insecurity (08/06/2024)  Housing: High Risk (08/06/2024)  Transportation Needs: Unmet Transportation Needs (08/06/2024)  Utilities: Not At Risk (08/06/2024)  Depression (PHQ2-9): Medium Risk (11/08/2022)  Financial Resource Strain: High Risk (02/15/2023)  Tobacco Use: Low Risk  (08/06/2024)     Readmission Risk Interventions     No data to display

## 2024-08-06 NOTE — Plan of Care (Signed)
  Problem: Education: Goal: Knowledge of General Education information will improve Description: Including pain rating scale, medication(s)/side effects and non-pharmacologic comfort measures Outcome: Progressing   Problem: Clinical Measurements: Goal: Ability to maintain clinical measurements within normal limits will improve Outcome: Progressing   Problem: Clinical Measurements: Goal: Will remain free from infection Outcome: Progressing   Problem: Health Behavior/Discharge Planning: Goal: Ability to manage health-related needs will improve Outcome: Progressing   Problem: Activity: Goal: Risk for activity intolerance will decrease Outcome: Progressing   Problem: Nutrition: Goal: Adequate nutrition will be maintained Outcome: Progressing

## 2024-08-06 NOTE — Discharge Instructions (Signed)
 TRANSPORTATION: -[place] Department of Health: Call [place] and [employer] at [phone] for details. [email or web address]  -[employer] [place]: [employer] [place] is the [employer]'s shared-ride transportation service for eligible riders who have a disability that prevents them from riding the fixed route bus. Call [phone]. [employer] [place] riders must pay a fare of $1.50 per trip, or may purchase a 10-ride punch card for $14.00 ($1.40 per ride) or a 40-ride punch card for $48.00 ($1.20 per ride).  -[employer] rideshare transportation service is provided for senior citizens (60+) who live independently within [place] city limits and are unable to drive or have limited access to transportation. Call [phone] to schedule an appointment.  -[place] [employer]: For Medicare or Medicaid recipients call [phone]?[facility], wheelchair fleeta, and ambulatory quotes available.   MEDICAID TRANSPORTATION: -If you have a Medicaid blue card or pink card and have no other means for transportation to doctor's offices, clinics, dentists, hospitals, and other health related trip needs.  -Transportation services are available to all [facility] locations. Trips to [facility] and [place] are provided in association with [employer]. -Services are provided between 6:00AM and 9:00PM Monday-Friday. -Call [phone] to schedule a trip or request further information.

## 2024-08-06 NOTE — Discharge Summary (Signed)
 Physician Discharge Summary  [name] FMW:[id number] DOB: [birth year] DOA: 08/05/2024  PCP: Pcp, [name]  Admit date: 08/05/2024 Discharge date: 08/06/2024  Admitted From: home Disposition:  home  Recommendations for Outpatient Follow-up:  Follow up with PCP in 1-2 weeks Please obtain BMP/CBC in one week Follow up with CHF clinic in 1 week  Home Health: none Equipment/Devices: none  Discharge Condition: stable CODE STATUS: Full code Diet Orders (From admission, onward)     Start     Ordered   08/05/24 1841  Diet 2 gram sodium Room service appropriate? Yes; Fluid consistency: Thin  Diet effective now       Question Answer Comment  Room service appropriate? Yes   Fluid consistency: Thin      08/05/24 1840           HPI: Per admitting MD, [name] is a 63 y.o. male with medical history significant of HFrEF, seizure disorder on keppra , alcohol abuse, CAD s/p CABG, HTN, hx cardiac arrest, and multiple other medical issues here after being seen in cardiology clinic and being found to have hypokalemia. History is obtained from chart review and discussion with EDP.  Patient is hard to keep on track, difficult to tell whether this is his baseline ([name] one available for collateral).  He was seen in cardiology clinic today.  He reported running out of all of his meds and not taking any meds in 30 days.  He c/o muscle cramps.  Also noted generally feeling poor, difficulty thinking straight, and swelling.  C/o generalized discomfort to triage.  He notes his last drink was 2 days ago and he drinks every few days (whenever someone gives him something).  Denies smoking or other drugs.  Hospital Course / Discharge diagnoses: Principal problem Hypokalemia - patient was admitted to the hospital with hypokalemia.  He ran out of all his home medications about 3 weeks ago. He was just seen in CHF clinic yesterday, and was found to have hypokalemia and directed to the hospital. He is  supposed to be on chronic K supplementation at home. His hypokalemia was corrected, he is better, will be discharged home in stable condition and all his scripts refilled at [facility] pharmacy  Active problems Chronic biV CHF - appears euvolemic, home medications refilled CAD - s/p CABG 2018. [name] chest pain Seizure disorder - Keppra  refilled ETOH use - he is somewhat vague about his intake. [name] withdrawals  Sepsis ruled out  Discharge Instructions   Allergies as of 08/06/2024       Reactions   Aldactone  [spironolactone ] Other (See Comments)   Gynecomastia         Medication List     TAKE these medications    Aspirin  Low Dose 81 MG tablet Generic drug: aspirin  EC TAKE 1 TABLET (81 MG TOTAL) BY MOUTH DAILY. SWALLOW WHOLE. (AM)   atorvastatin  80 MG tablet Commonly known as: LIPITOR  Take 1 tablet (80 mg total) by mouth daily.   empagliflozin  10 MG Tabs tablet Commonly known as: Jardiance  Take 1 tablet (10 mg total) by mouth daily.   ezetimibe  10 MG tablet Commonly known as: ZETIA  Take 1 tablet (10 mg total) by mouth daily.   levETIRAcetam  500 MG tablet Commonly known as: KEPPRA  Take 1 tablet (500 mg total) by mouth 2 (two) times daily.   losartan  25 MG tablet Commonly known as: COZAAR  Take 1 tablet (25 mg total) by mouth daily.   metoprolol  succinate 25 MG 24 hr tablet Commonly  known as: TOPROL -XL Take 0.5 tablets (12.5 mg total) by mouth daily.   multivitamin with minerals tablet Take 1 tablet by mouth daily.   potassium chloride  SA 20 MEQ tablet Commonly known as: Klor-Con  M20 Take 2 tablets (40 mEq total) by mouth daily.   Torsemide  60 MG Tabs Take 60 mg by mouth daily. What changed:  medication strength Another medication with the same name was removed. Continue taking this medication, and follow the directions you see here.       Consultations: none  Procedures/Studies:  CT HEAD WO CONTRAST ( ) Result Date: 08/05/2024 CLINICAL DATA:  Mental  status change, unknown cause 63 yr old er pt; ams, unknown cause EXAM: CT HEAD WITHOUT CONTRAST TECHNIQUE: Contiguous axial images were obtained from the base of the skull through the vertex without intravenous contrast. RADIATION DOSE REDUCTION: This exam was performed according to the departmental dose-optimization program which includes automated exposure control, adjustment of the mA and/or kV according to patient size and/or use of iterative reconstruction technique. COMPARISON:  MRI 10/22/2019, CT head 10/21/2019 FINDINGS: Brain: Cerebral ventricle sizes are concordant with the degree of cerebral volume loss. Patchy and confluent areas of decreased attenuation are noted throughout the deep and periventricular white matter of the cerebral hemispheres bilaterally, compatible with chronic microvascular ischemic disease. [name] evidence of large-territorial acute infarction. [name] parenchymal hemorrhage. [name] mass lesion. [name] extra-axial collection. [name] mass effect or midline shift. [name] hydrocephalus. Basilar cisterns are patent. Vascular: [name] hyperdense vessel. Atherosclerotic calcifications are present within the cavernous internal carotid and vertebral arteries. Skull: [name] acute fracture or focal lesion. Sinuses/Orbits: Paranasal sinuses and mastoid air cells are clear. The orbits are unremarkable. Other: None. IMPRESSION: [name] acute intracranial abnormality. Electronically Signed   By: Morgane  Naveau M.D.   On: 08/05/2024 18:01   DG Chest 1 View Result Date: 08/05/2024 CLINICAL DATA:  [id number] SOB (shortness of breath) 141880. EXAM: CHEST  1 VIEW COMPARISON:  06/12/2024. FINDINGS: Bilateral lung fields are clear. Bilateral costophrenic angles are clear. Stable cardio-mediastinal silhouette. There are surgical staples along the heart border and sternotomy wires, status post CABG (coronary artery bypass graft). There is a left sided ICD device. [name] acute osseous abnormalities. The soft tissues are within normal limits.  IMPRESSION: [name] active disease. Electronically Signed   By: Ree Molt M.D.   On: 08/05/2024 15:01     Subjective: - [name] chest pain, shortness of breath, [name] abdominal pain, nausea or vomiting.   Discharge Exam: BP 118/75   Pulse 76   Temp 97.8 F (36.6 C) (Oral)   Resp 18   Ht 5' 10 (1.778 m)   Wt 67 kg   SpO2 99%   BMI 21.18 kg/m   General: Pt is alert, awake, not in acute distress Cardiovascular: RRR, S1/S2 +, [name] rubs, [name] gallops Respiratory: CTA bilaterally, [name] wheezing, [name] rhonchi Abdominal: Soft, NT, ND, bowel sounds + Extremities: [name] edema, [name] cyanosis    The results of significant diagnostics from this hospitalization (including imaging, microbiology, ancillary and laboratory) are listed below for reference.     Microbiology: Recent Results (from the past 240 hours)  MRSA Next Gen by PCR, Nasal     Status: None   Collection Time: 08/06/24  5:37 AM   Specimen: Nasal Mucosa; Nasal Swab  Result Value Ref Range Status   MRSA by PCR Next Gen NOT DETECTED NOT DETECTED Final    Comment: (NOTE) The GeneXpert MRSA Assay (FDA approved for NASAL specimens only), is one component of  a comprehensive MRSA colonization surveillance program. It is not intended to diagnose MRSA infection nor to guide or monitor treatment for MRSA infections. Test performance is not FDA approved in patients less than 76 years old. Performed at [facility], [address] [place], [place] [address]      Labs: Basic Metabolic Panel: Recent Labs  Lab 08/05/24 1149 08/05/24 1427 08/05/24 1718 08/05/24 1854 08/06/24 0228  NA 136 137  --   --  138  K 2.8* 2.6*  --   --  3.2*  CL 94* 95*  --   --  98  CO2 28 29  --   --  30  GLUCOSE 81 80  --   --  92  BUN 12 14  --   --  18  CREATININE 1.46* 1.62*  --  1.56* 1.57*  CALCIUM  9.7 9.3  --   --  8.9  MG  --   --  1.8  --   --    Liver Function Tests: Recent Labs  Lab 08/05/24 1149 08/06/24 0228  AST 74* 49*  ALT 71* 57*   ALKPHOS 113 96  BILITOT 2.2* 1.1  PROT 7.2 6.0*  ALBUMIN  3.7 3.0*   CBC: Recent Labs  Lab 08/05/24 1427 08/05/24 1854 08/06/24 0228  WBC 6.9 7.1 6.9  HGB 14.0 14.6 13.4  HCT 42.0 43.5 40.0  MCV 90.3 89.1 89.3  PLT 237 240 236   CBG: [name] results for input(s): GLUCAP in the last 168 hours. Hgb A1c [name] results for input(s): HGBA1C in the last 72 hours. Lipid Profile Recent Labs    08/05/24 1149  CHOL 134  HDL 28*  LDLCALC 91  TRIG 76  CHOLHDL 4.8   Thyroid  function studies Recent Labs    08/05/24 1854  TSH 2.349   Urinalysis    Component Value Date/Time   COLORURINE COLORLESS (A) 01/08/2020 1922   APPEARANCEUR CLEAR 01/08/2020 1922   LABSPEC 1.002 (L) 01/08/2020 1922   PHURINE 7.0 01/08/2020 1922   GLUCOSEU NEGATIVE 01/08/2020 1922   HGBUR NEGATIVE 01/08/2020 1922   BILIRUBINUR NEGATIVE 01/08/2020 1922   KETONESUR NEGATIVE 01/08/2020 1922   PROTEINUR NEGATIVE 01/08/2020 1922   NITRITE NEGATIVE 01/08/2020 1922   LEUKOCYTESUR NEGATIVE 01/08/2020 1922    FURTHER DISCHARGE INSTRUCTIONS:   Get Medicines reviewed and adjusted: Please take all your medications with you for your next visit with your Primary MD   Laboratory/radiological data: Please request your Primary MD to go over all hospital tests and procedure/radiological results at the follow up, please ask your Primary MD to get all Hospital records sent to his/her office.   In some cases, they will be blood work, cultures and biopsy results pending at the time of your discharge. Please request that your primary care M.D. goes through all the records of your hospital data and follows up on these results.   Also Note the following: If you experience worsening of your admission symptoms, develop shortness of breath, life threatening emergency, suicidal or homicidal thoughts you must seek medical attention immediately by calling 911 or calling your MD immediately  if symptoms less severe.   You must  read complete instructions/literature along with all the possible adverse reactions/side effects for all the Medicines you take and that have been prescribed to you. Take any new Medicines after you have completely understood and accpet all the possible adverse reactions/side effects.    Do not drive when taking Pain medications or sleeping medications (  Benzodaizepines)   Do not take more than prescribed Pain, Sleep and Anxiety Medications. It is not advisable to combine anxiety,sleep and pain medications without talking with your primary care practitioner   Special Instructions: If you have smoked or chewed Tobacco  in the last 2 yrs please stop smoking, stop any regular Alcohol  and or any Recreational drug use.   Wear Seat belts while driving.   Please note: You were cared for by a hospitalist during your hospital stay. Once you are discharged, your primary care physician will handle any further medical issues. Please note that [name] REFILLS for any discharge medications will be authorized once you are discharged, as it is imperative that you return to your primary care physician (or establish a relationship with a primary care physician if you do not have one) for your post hospital discharge needs so that they can reassess your need for medications and monitor your lab values.  Time coordinating discharge: 35 minutes  SIGNED:  Nilda Fendt, MD, PhD 08/06/2024, 9:48 AM

## 2024-08-06 NOTE — Progress Notes (Signed)
 Heart Failure Navigator Progress Note  Assessed for Heart & Vascular TOC clinic readiness.  Patient does not meet criteria due to Advanced Heart Failure Team patient of Dr. Gala Romney. .   Navigator will sign off at this time.   Rhae Hammock, BSN, Scientist, clinical (histocompatibility and immunogenetics) Only

## 2024-08-06 NOTE — Progress Notes (Addendum)
 Patient oxygen saturation decreased to 85% on RA while sleeping.  Oxygen applied @ 2 LPM via [place], saturation improved to 97%.

## 2024-08-06 NOTE — Plan of Care (Signed)

## 2024-08-08 ENCOUNTER — Telehealth (HOSPITAL_COMMUNITY): Payer: Self-pay | Admitting: Licensed Clinical Social Worker

## 2024-08-08 NOTE — Telephone Encounter (Signed)
 CSW called pt to help set up ride to upcoming appts- unable to reach or leave VM so sent text message requesting return call- will continue to follow and assist as needed  Lailah Marcelli H. Wanya Bangura, LCSW Clinical Social Worker [facility] Desk#: [phone] Cell#: [phone]

## 2024-08-11 ENCOUNTER — Telehealth (HOSPITAL_COMMUNITY): Payer: Self-pay | Admitting: Licensed Clinical Social Worker

## 2024-08-11 NOTE — Telephone Encounter (Signed)
 H&V Care Navigation CSW Progress Note  Clinical Social Worker called pt to check in post hospital stay- able to reach at new phone number on file.  CSW spoke with staff regarding post hospital follow up and had pt scheduled for appt on Thursday.  CSW assisted in arranging Medicaid transport:  Pick up from home 9:20-9:50 Pick up from appt 12pm Ride ID [id number]  Patient is participating in a Managed Medicaid Plan:  Yes  SDOH Screenings   Food Insecurity: No Food Insecurity (08/06/2024)  Housing: High Risk (08/06/2024)  Transportation Needs: Unmet Transportation Needs (08/06/2024)  Utilities: Not At Risk (08/06/2024)  Depression (PHQ2-9): Medium Risk (11/08/2022)  Financial Resource Strain: High Risk (02/15/2023)  Tobacco Use: Low Risk  (08/06/2024)   Jose HILARIO Leech, LCSW Clinical Social Worker [facility] Desk#: [phone] Cell#: [phone]

## 2024-08-12 ENCOUNTER — Other Ambulatory Visit (HOSPITAL_COMMUNITY)

## 2024-08-12 LAB — URINE DRUGS OF ABUSE SCREEN W ALC, ROUTINE (REF LAB)
Barbiturate, Ur: NEGATIVE ng/mL
Benzodiazepine Quant, Ur: NEGATIVE ng/mL
Cannabinoid Quant, Ur: NEGATIVE ng/mL
Cocaine (Metab.): NEGATIVE ng/mL
Creatinine, Urine: 99.6 mg/dL (ref 20.0–300.0)
Ethanol U, Quan: NEGATIVE %
Methadone Screen, Urine: NEGATIVE ng/mL
Nitrite Urine, Quantitative: NEGATIVE ug/mL
OPIATE SCREEN URINE: NEGATIVE ng/mL
Phencyclidine, Ur: NEGATIVE ng/mL
Propoxyphene, Urine: NEGATIVE ng/mL
pH, Urine: 5.2 (ref 4.5–8.9)

## 2024-08-12 LAB — AMPHETAMINE CONF, UR
Amphetamine GC/MS Conf: 600 ng/mL
Amphetamine, Ur: POSITIVE — AB
Amphetamines: POSITIVE — AB
Methamphetamine Quant, Ur: >3000 ng/mL
Methamphetamine, Ur: POSITIVE — AB

## 2024-08-14 ENCOUNTER — Ambulatory Visit (INDEPENDENT_AMBULATORY_CARE_PROVIDER_SITE_OTHER)

## 2024-08-14 ENCOUNTER — Ambulatory Visit (HOSPITAL_COMMUNITY): Payer: Self-pay | Admitting: Physician Assistant

## 2024-08-14 ENCOUNTER — Ambulatory Visit (HOSPITAL_COMMUNITY)
Admission: RE | Admit: 2024-08-14 | Discharge: 2024-08-14 | Disposition: A | Discharge status: Home / Self Care | Source: Ambulatory Visit | Attending: Physician Assistant | Admitting: Physician Assistant

## 2024-08-14 ENCOUNTER — Encounter (HOSPITAL_COMMUNITY): Payer: Self-pay

## 2024-08-14 ENCOUNTER — Telehealth (HOSPITAL_COMMUNITY): Payer: Self-pay | Admitting: Licensed Clinical Social Worker

## 2024-08-14 VITALS — BP 100/70 | HR 85 | Wt 145.8 lb

## 2024-08-14 DIAGNOSIS — I5082 Biventricular heart failure: Secondary | ICD-10-CM | POA: Insufficient documentation

## 2024-08-14 DIAGNOSIS — I110 Hypertensive heart disease with heart failure: Secondary | ICD-10-CM | POA: Diagnosis present

## 2024-08-14 DIAGNOSIS — Z8249 Family history of ischemic heart disease and other diseases of the circulatory system: Secondary | ICD-10-CM | POA: Insufficient documentation

## 2024-08-14 DIAGNOSIS — Z951 Presence of aortocoronary bypass graft: Secondary | ICD-10-CM | POA: Diagnosis not present

## 2024-08-14 DIAGNOSIS — F1910 Other psychoactive substance abuse, uncomplicated: Secondary | ICD-10-CM

## 2024-08-14 DIAGNOSIS — G40909 Epilepsy, unspecified, not intractable, without status epilepticus: Secondary | ICD-10-CM | POA: Insufficient documentation

## 2024-08-14 DIAGNOSIS — Z8616 Personal history of COVID-19: Secondary | ICD-10-CM | POA: Diagnosis not present

## 2024-08-14 DIAGNOSIS — I2510 Atherosclerotic heart disease of native coronary artery without angina pectoris: Secondary | ICD-10-CM | POA: Insufficient documentation

## 2024-08-14 DIAGNOSIS — F1011 Alcohol abuse, in remission: Secondary | ICD-10-CM | POA: Insufficient documentation

## 2024-08-14 DIAGNOSIS — Z5986 Financial insecurity: Secondary | ICD-10-CM | POA: Insufficient documentation

## 2024-08-14 DIAGNOSIS — Z5982 Transportation insecurity: Secondary | ICD-10-CM | POA: Diagnosis not present

## 2024-08-14 DIAGNOSIS — Z79899 Other long term (current) drug therapy: Secondary | ICD-10-CM | POA: Diagnosis not present

## 2024-08-14 DIAGNOSIS — I5022 Chronic systolic (congestive) heart failure: Secondary | ICD-10-CM | POA: Diagnosis not present

## 2024-08-14 DIAGNOSIS — F1511 Other stimulant abuse, in remission: Secondary | ICD-10-CM | POA: Diagnosis not present

## 2024-08-14 DIAGNOSIS — I255 Ischemic cardiomyopathy: Secondary | ICD-10-CM | POA: Diagnosis not present

## 2024-08-14 DIAGNOSIS — Z95810 Presence of automatic (implantable) cardiac defibrillator: Secondary | ICD-10-CM | POA: Diagnosis not present

## 2024-08-14 DIAGNOSIS — Z7982 Long term (current) use of aspirin: Secondary | ICD-10-CM | POA: Insufficient documentation

## 2024-08-14 DIAGNOSIS — F1010 Alcohol abuse, uncomplicated: Secondary | ICD-10-CM

## 2024-08-14 LAB — BASIC METABOLIC PANEL WITH GFR
Anion gap: 13 (ref 5–15)
BUN: 20 mg/dL (ref 8–23)
CO2: 34 mmol/L — ABNORMAL HIGH (ref 22–32)
Calcium: 8.6 mg/dL — ABNORMAL LOW (ref 8.9–10.3)
Chloride: 88 mmol/L — ABNORMAL LOW (ref 98–111)
Creatinine, Ser: 1.53 mg/dL — ABNORMAL HIGH (ref 0.61–1.24)
GFR, Estimated: 51 mL/min — ABNORMAL LOW (ref >60)
Glucose, Bld: 86 mg/dL (ref 70–99)
Potassium: 2.8 mmol/L — ABNORMAL LOW (ref 3.5–5.1)
Sodium: 135 mmol/L (ref 135–145)

## 2024-08-14 LAB — BRAIN NATRIURETIC PEPTIDE: B Natriuretic Peptide: 489.9 pg/mL — ABNORMAL HIGH (ref 0.0–100.0)

## 2024-08-14 MED ORDER — LOSARTAN POTASSIUM 25 MG PO TABS: 12.5000 mg | ORAL_TABLET | Freq: Every day | ORAL | 3 refills | Status: AC

## 2024-08-14 MED ORDER — LEVETIRACETAM 500 MG PO TABS: 500.0000 mg | ORAL_TABLET | Freq: Two times a day (BID) | ORAL | 11 refills | Status: AC

## 2024-08-14 MED ORDER — EZETIMIBE 10 MG PO TABS: 10.0000 mg | ORAL_TABLET | Freq: Every day | ORAL | 11 refills | Status: AC

## 2024-08-14 NOTE — Patient Instructions (Signed)
 TAKE 12.5 mg ( 1/2 Tab) of Losartan  daily.  Labs done today, your results will be available in MyChart, we will contact you for abnormal readings.   Your physician recommends that you schedule a follow-up appointment in: 4 weeks.  If you have any questions or concerns before your next appointment please send us  a message through [place] or call our office at [phone].    TO LEAVE A MESSAGE FOR THE NURSE SELECT OPTION 2, PLEASE LEAVE A MESSAGE INCLUDING: YOUR NAME DATE OF BIRTH CALL BACK NUMBER REASON FOR CALL**this is important as we prioritize the call backs  YOU WILL RECEIVE A CALL BACK THE SAME DAY AS LONG AS YOU CALL BEFORE 4:00 PM  At the [facility], you and your health needs are our priority. As part of our continuing mission to provide you with exceptional heart care, we have created designated Provider Care Teams. These Care Teams include your primary Cardiologist (physician) and Advanced Practice Providers (APPs- Physician Assistants and Nurse Practitioners) who all work together to provide you with the care you need, when you need it.   You may see any of the following providers on your designated Care Team at your next follow up: Dr Toribio Fuel Dr Ezra Shuck Dr. Ria Commander Dr. Morene Brownie Amy Lenetta, NP Caffie Shed, [place] [facility] [place], [place] Beckey Coe, NP [place] Lee, NP Ellouise Class, NP Tinnie Redman, PharmD Jaun Bash, PharmD   Please be sure to bring in all your medications bottles to every appointment.    Thank you for choosing [facility]

## 2024-08-14 NOTE — Telephone Encounter (Unsigned)
 H&V Care Navigation CSW Progress Note  Clinical Social Worker {Blank single:[id number]::met with patient,met with caregiver,contacted patient by phone,contacted caregiver by phone,***} to ***.  Patient is participating in a Managed Medicaid Plan:  {MM YES/NO:27447::Yes}  SDOH Screenings   Food Insecurity: No Food Insecurity (08/06/2024)  Housing: High Risk (08/06/2024)  Transportation Needs: Unmet Transportation Needs (08/11/2024)  Utilities: Not At Risk (08/06/2024)  Depression (PHQ2-9): Medium Risk (11/08/2022)  Financial Resource Strain: High Risk (02/15/2023)  Tobacco Use: Low Risk  (08/14/2024)

## 2024-08-14 NOTE — Progress Notes (Signed)
 H&V Care Navigation CSW Progress Note  Clinical Social Worker met with pt during clinic visit to check in. Assisted in updating address with [facility].  Pt has PCP visit next week and one month follow up with clinic- will assist with arranging transportation.  Patient is participating in a Managed Medicaid Plan:  Yes  SDOH Screenings   Food Insecurity: No Food Insecurity (08/06/2024)  Housing: High Risk (08/06/2024)  Transportation Needs: Unmet Transportation Needs (08/11/2024)  Utilities: Not At Risk (08/06/2024)  Depression (PHQ2-9): Medium Risk (11/08/2022)  Financial Resource Strain: High Risk (02/15/2023)  Tobacco Use: Low Risk  (08/14/2024)    Andriette HILARIO Leech, LCSW Clinical Social Worker [facility] Desk#: [phone] Cell#: [phone]

## 2024-08-14 NOTE — Addendum Note (Signed)
 Encounter addended by: Marcelina Lisa HERO, RN on: 08/14/2024 2:21 PM  Actions taken: Order list changed, Diagnosis association updated

## 2024-08-14 NOTE — Progress Notes (Signed)
 ReDS Vest / Clip - 08/14/24 1000       ReDS Vest / Clip   Station Marker C    Ruler Value 30    ReDS Value Range Low volume    ReDS Actual Value 33

## 2024-08-14 NOTE — Progress Notes (Addendum)
 ADVANCED HF CLINIC NOTE   PCP: Haze Servant, NP HF Cardiologist: Dr Cherrie   HPI: [name] is a 63 y.o. male with h/o ETOH abuse, VF Arrest 2018, CAD s/p CABG 12/07/17, and ICM with EF 25-30%.   Admitted 12/18 with VF arrest while chopping wood. Received CPR in the field. R/LHC with severe 3vD as below and cardiogenic shock requiring IABP and milrinone . Required milrinone  with continued shock. Pt improved and IABP removed 12/10, but pt had recurrent VF arrest so IABP replaced. Stabilized and underwent CABG 12/07/17. Tolerated gradual wean of meds and extubation with no further events.  Echo 5/20 EF at 25%.    9/20 implantation of a [employer] single chamber ICD.   Routine clinic f/u on 10/23/19 and while in exam room had a witnessed seizure with loss of pulse. CPR started with quick ROSC. He did not require shock. Taken to the ED and had another seizure. Neurology consulted. Started on Keppra . ICD interrogated. No arrhythmias noted. Admitted to [facility] on 12/09/19 with recurrent seizures in setting of noncompliance with his Keppra .    Admitted 8/21 with COVID PNA.   Follow up 5/22 he was having facial and arm tingling, no other neuro symptoms. Ethanol level 145.   He's had numerous clinic visits and admissions over the last few years with volume overload in setting of noncompliance with medical therapy.  Admitted 6/25 w/ a/c CHF, in the setting of med nonadherence. Had reportedly been out of meds x 1 month. Echo w/ EF less than 20%, moderately reduced RV. Diuresed w/ IV Lasix  and started back on HF GDMT.  D/c wt 146 lb.   Seen for follow-up 08/05/24 and was off all medications after running out.  He was sent to the ED after clinic labs resulted for severe hypokalemia. He was admitted overnight, K repleted. UDS + for methamphetamine  Here today for CHF follow-up. Feels okay from HF standpoint. Denies shortness of breath, orthopnea, PND and lower extremity  edema. He gets his meds bubble packed from [employer]. Keppra , Zetia  and Losartan  were not included in his bubble packs. He had been out of his meds until a few days ago. The bubble packs were delivered to his old address and were just brought to him by a neighbor.   Rents a trailer with his girlfriend. Has medicaid. He denies ETOH and tobacco use.   ROS: All systems negative except as listed in HPI, PMH and Problem List.  SH:  Social History   Socioeconomic History   Marital status: Divorced    Spouse name: Not on file   Number of children: Not on file   Years of education: Not on file   Highest education level: Not on file  Occupational History   Not on file  Tobacco Use   Smoking status: Never   Smokeless tobacco: Never  Vaping Use   Vaping status: Never Used  Substance and Sexual Activity   Alcohol use: Not Currently    Alcohol/week: 14.0 standard drinks of alcohol    Types: 14 Cans of beer per week    Comment: patient endorses at least 1-2 beers daily (04/2020); 03/2023   Drug use: No   Sexual activity: Not Currently  Other Topics Concern   Not on file  Social History Narrative   Lives at home with his brother who is crippled. He takes care of his brother.    Right handed    Social Drivers of Health   Financial  Resource Strain: High Risk (02/15/2023)   Overall Financial Resource Strain (CARDIA)    Difficulty of Paying Living Expenses: Very hard  Food Insecurity: No Food Insecurity (08/06/2024)   Hunger Vital Sign    Worried About Running Out of Food in the Last Year: Never true    Ran Out of Food in the Last Year: Never true  Transportation Needs: Unmet Transportation Needs (08/11/2024)   PRAPARE - Administrator, Civil Service (Medical): Yes    Lack of Transportation (Non-Medical): Yes  Physical Activity: Not on file  Stress: Not on file  Social Connections: Not on file  Intimate Partner Violence: Not At Risk (08/06/2024)   Humiliation, Afraid,  Rape, and Kick questionnaire    Fear of Current or Ex-Partner: No    Emotionally Abused: No    Physically Abused: No    Sexually Abused: No   FH:  Family History  Problem Relation Age of Onset   Alzheimer's disease Mother    Lung disease Father    Heart attack Other    Diabetes Neg Hx    Colon cancer Neg Hx    Esophageal cancer Neg Hx    Pancreatic cancer Neg Hx    Stomach cancer Neg Hx    Past Medical History:  Diagnosis Date   AICD (automatic cardioverter/defibrillator) present    Alcohol abuse 05/18/2020   Arthritis    hands; maybe my toes (12/20/2017)   CAD (coronary artery disease)    Cardiac arrest (HCC) 11/29/2017   hx VF arrest/notes 12/20/2017   CHF (congestive heart failure) (HCC)    Chronic systolic heart failure (HCC) 08/25/2019   Coronary artery disease    Coronary artery disease involving native coronary artery of native heart without angina pectoris 12/07/2017   Essential hypertension 05/18/2020   GERD (gastroesophageal reflux disease)    H/O ETOH abuse    /notes 12/20/2017   High cholesterol    Hypertension    Ischemic cardiomyopathy    a. 08/2019 s/p BSX D232 single lead AICD.   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) 12/30/2019   Syncope and collapse    last few days (12/20/2017)   Current Outpatient Medications  Medication Sig Dispense Refill   ASPIRIN  LOW DOSE 81 MG tablet TAKE 1 TABLET (81 MG TOTAL) BY MOUTH DAILY. SWALLOW WHOLE. (AM) 30 tablet 11   atorvastatin  (LIPITOR ) 80 MG tablet Take 1 tablet (80 mg total) by mouth daily. 90 tablet 0   empagliflozin  (JARDIANCE ) 10 MG TABS tablet Take 1 tablet (10 mg total) by mouth daily. 90 tablet 0   metoprolol  succinate (TOPROL -XL) 25 MG 24 hr tablet Take 0.5 tablets (12.5 mg total) by mouth  daily. 45 tablet 0   Multiple Vitamins-Minerals (MULTIVITAMIN WITH MINERALS) tablet Take 1 tablet by mouth daily. 90 tablet 0   potassium chloride  SA (KLOR-CON  M) 20 MEQ tablet Take 2 tablets (40 mEq total) by mouth daily. 60 tablet 0   torsemide  (DEMADEX ) 20 MG tablet Take 3 tablets (60 mg total) by mouth daily. 270 tablet 0   ezetimibe  (ZETIA ) 10 MG tablet Take 1 tablet (10 mg total) by mouth daily. 30 tablet 11   levETIRAcetam  (KEPPRA ) 500 MG tablet Take 1 tablet (500 mg total) by mouth 2 (two) times daily. 60 tablet  11   losartan  (COZAAR ) 25 MG tablet Take 0.5 tablets (12.5 mg total) by mouth daily. 45 tablet 3   No current facility-administered medications for this encounter.   BP 100/70   Pulse 85   Wt 66.1 kg (145 lb 12.8 oz)   SpO2 97%   BMI 20.92 kg/m   Wt Readings from Last 3 Encounters:  08/14/24 66.1 kg (145 lb 12.8 oz)  08/06/24 67 kg (147 lb 9.6 oz)  08/05/24 67.6 kg (149 lb)   PHYSICAL EXAM: General:  Chronically ill appearing Cor: No JVD. Regular rate & rhythm. No murmurs. Lungs: clear Abdomen: soft, nontender, nondistended.  Extremities: no edema Neuro: alert & orientedx3. Affect pleasant  [place] Sci ICD Interrogation: HL was elevated to ~ 40 in early August, now trending down (13 today). Thoracic impedance trending up.   ASSESSMENT & PLAN: 1. Chronic Biventricular HF, ICM  - Echo 11/30/17 25-30% RV normal.   - Echo 03/2018: EF 40-45%  RV normal.  - Echo 5/20 EF ~ 25%. Has ICD.  - Echo 12/2019 EF 30-35% RV normal. - Mixed Ischemic + probable ETOH mediated component  - Echo (10/23): biventricular failure getting worse from LVEF 30-35% Mod RV--> LVEF < 20% severely reduced RV.  - Echo (6/25): EF < 20%, RV moderately reduced  - NYHA II. Volume okay on exam. ReDS normal at 33%. HL on his ICD had been elevated earlier this month, now trending down. - Continue Torsemide  60 mg daily - Continue Jardiance  10 mg daily - Restart losartan  at lower dose 12.5 mg daily (may  have to wait a few weeks for this to be included in bubble packs, do not think he can manage on his own) - Continue Toprol  XL 12.5 mg daily - Now off digoxin  d/t elevated level and poor compliance  - No spiro with h/o gynecomastia. Could consider Inspra  but would avoid given h/o poor compliance  - Not a candidate for advanced therapies/transplant with ongoing ETOH/substance abuse and compliance (this includes home inotrope).  - BMET/BNP today  2. CAD - S/P CABG x 3 2018  - denies CP  - Continue ASA 81 mg  - Continue Lipitor  80 mg.  - Restart Zetia  10 mg daily (will need to be added to bubble packs)   3. Seizure Disorder - has full bottle of Keppra  but not taking - he will try to remember to take it BID until it can be added to bubble packs. Will send one time order to [facility] for bubble packs but needs f/u with Neurology and/or PCP   4. ETOH Abuse - Long hx of ETOH abuse. Reports he has not been drinking any alcohol recently.   5. Substance abuse - + for methamphetamine during recent admit - Denies any recent drug use  SDOH - followed by HF SW team. Given food bag last week. SW assists with arranging transportation. His pharmacy was contacted by HF SW to update his bubble packs with med changes and change his home address.  F/u 4 weeks with APP to assess volume and compliance  Lastacia Solum N, PA-C  08/14/24

## 2024-08-15 ENCOUNTER — Other Ambulatory Visit (HOSPITAL_COMMUNITY)

## 2024-08-15 ENCOUNTER — Telehealth (HOSPITAL_COMMUNITY): Payer: Self-pay | Admitting: Licensed Clinical Social Worker

## 2024-08-15 ENCOUNTER — Other Ambulatory Visit (HOSPITAL_COMMUNITY): Payer: Self-pay

## 2024-08-15 MED ORDER — POTASSIUM CHLORIDE CRYS ER 20 MEQ PO TBCR: 40.0000 meq | EXTENDED_RELEASE_TABLET | Freq: Every day | ORAL | 11 refills | Status: AC

## 2024-08-15 NOTE — Telephone Encounter (Signed)
 H&V Care Navigation CSW Progress Note  Clinical Social Worker informed by patient that ride never came. Clinic staff informed- have rescheduled pt for labs on Monday.  CSW arranged transport through Medicaid:  Pick up from home 11:05-11:35am Pick up from clinic 1:15pm Ride ID: [id number]  Patient is participating in a Managed Medicaid Plan:  Yes  SDOH Screenings   Food Insecurity: No Food Insecurity (08/06/2024)  Housing: High Risk (08/06/2024)  Transportation Needs: Unmet Transportation Needs (08/14/2024)  Utilities: Not At Risk (08/06/2024)  Depression (PHQ2-9): Medium Risk (11/08/2022)  Financial Resource Strain: High Risk (02/15/2023)  Tobacco Use: Low Risk  (08/14/2024)    Andriette HILARIO Leech, LCSW Clinical Social Worker Advanced Heart Failure Clinic Desk#: [phone] Cell#: [phone]

## 2024-08-18 ENCOUNTER — Telehealth (HOSPITAL_COMMUNITY): Payer: Self-pay | Admitting: Licensed Clinical Social Worker

## 2024-08-18 ENCOUNTER — Other Ambulatory Visit (HOSPITAL_COMMUNITY)

## 2024-08-18 NOTE — Telephone Encounter (Signed)
 CSW called pt to check if he was still able to come to appt today and remind of transport.  Received text from pt significant other stating that he was in jail so would not be available to come to appt. Ride canceled.  Will continue to follow and assist as needed  Andriette HILARIO Leech, LCSW Clinical Social Worker [facility] Clinic Desk#: [phone] Cell#: [phone]

## 2024-08-19 LAB — CUP PACEART REMOTE DEVICE CHECK
Battery Remaining Longevity: 132 mo
Battery Remaining Percentage: 94 %
Brady Statistic RV Percent Paced: 0 %
Date Time Interrogation Session: 20250821102000
HighPow Impedance: 79 Ohm
Implantable Lead Connection Status: 753985
Implantable Lead Implant Date: 20200904
Implantable Lead Location: 753860
Implantable Lead Model: 293
Implantable Lead Serial Number: 443717
Implantable Pulse Generator Implant Date: 20200904
Lead Channel Impedance Value: 465 Ohm
Lead Channel Setting Pacing Amplitude: 2.5 V
Lead Channel Setting Pacing Pulse Width: 0.4 ms
Lead Channel Setting Sensing Sensitivity: 0.5 mV
Pulse Gen Serial Number: 271403
Zone Setting Status: 755011

## 2024-08-20 ENCOUNTER — Ambulatory Visit: Payer: Self-pay | Admitting: Internal Medicine

## 2024-08-21 ENCOUNTER — Ambulatory Visit: Admitting: Student

## 2024-08-21 NOTE — Progress Notes (Deleted)
   New Patient Office Visit  Subjective   Patient ID: [name], male    DOB: April 15, 1961  Age: 63 y.o. MRN: [id number]  No chief complaint on file.   [name] is a 63 y.o. who presents to the clinic for ***. Please see problem based assessment and plan for additional details.  Hypokalemia - patient was admitted to the hospital with hypokalemia.  He ran out of all his home medications about 3 weeks ago. He was just seen in CHF clinic yesterday, and was found to have hypokalemia and directed to the hospital. He is supposed to be on chronic K supplementation at home. His hypokalemia was corrected, he is better, will be discharged home in stable condition and all his scripts refilled at [facility] pharmacy   Active problems Chronic biV CHF - appears euvolemic, home medications refilled CAD - s/p CABG 2018. No chest pain Seizure disorder - Keppra  refilled ETOH use - he is somewhat vague about his intake. No withdrawals    {History (Optional):23778}  ROS    Objective:     There were no vitals taken for this visit. {Vitals History (Optional):23777}  Physical Exam   No results found for any visits on 08/21/24.  {Labs (Optional):23779}  The ASCVD Risk score (Arnett DK, et al., 2019) failed to calculate for the following reasons:   Risk score cannot be calculated because patient has a medical history suggesting prior/existing ASCVD    Assessment & Plan:   Problem List Items Addressed This Visit   None   No follow-ups on file.    Jose Lease, DO

## 2024-08-26 ENCOUNTER — Encounter: Payer: Self-pay | Admitting: Internal Medicine

## 2024-09-03 NOTE — Progress Notes (Incomplete)
 ADVANCED HF CLINIC NOTE   PCP: Haze Servant, NP HF Cardiologist: Dr Cherrie   HPI: [name] is a 63 y.o. male with h/o ETOH abuse, VF Arrest 2018, CAD s/p CABG 12/07/17, and ICM with EF 25-30%.   Admitted 12/18 with VF arrest while chopping wood. Received CPR in the field. R/LHC with severe 3vD as below and cardiogenic shock requiring IABP and milrinone . Required milrinone  with continued shock. Pt improved and IABP removed 12/10, but pt had recurrent VF arrest so IABP replaced. Stabilized and underwent CABG 12/07/17. Tolerated gradual wean of meds and extubation with no further events.  Echo 5/20 EF at 25%.    9/20 implantation of a [employer] single chamber ICD.   Routine clinic f/u on 10/23/19 and while in exam room had a witnessed seizure with loss of pulse. CPR started with quick ROSC. He did not require shock. Taken to the ED and had another seizure. Neurology consulted. Started on Keppra . ICD interrogated. No arrhythmias noted. Admitted to [facility] on 12/09/19 with recurrent seizures in setting of noncompliance with his Keppra .    Admitted 8/21 with COVID PNA.   Follow up 5/22 he was having facial and arm tingling, no other neuro symptoms. Ethanol level 145.   He's had numerous clinic visits and admissions over the last few years with volume overload in setting of noncompliance with medical therapy.  Admitted 6/25 w/ a/c CHF, in the setting of med nonadherence. Had reportedly been out of meds x 1 month. Echo w/ EF less than 20%, moderately reduced RV. Diuresed w/ IV Lasix  and started back on HF GDMT.  D/c wt 146 lb.   Seen for follow-up 08/05/24 and was off all medications after running out.  He was sent to the ED after clinic labs resulted for severe hypokalemia. He was admitted overnight, K repleted. UDS + for methamphetamine  Here today for CHF follow-up. Feels okay from HF standpoint. Denies shortness of breath, orthopnea, PND and lower extremity  edema. He gets his meds bubble packed from [employer]. Keppra , Zetia  and Losartan  were not included in his bubble packs. He had been out of his meds until a few days ago. The bubble packs were delivered to his old address and were just brought to him by a neighbor.   Rents a trailer with his girlfriend. Has medicaid. He denies ETOH and tobacco use.   ROS: All systems negative except as listed in HPI, PMH and Problem List.  SH:  Social History   Socioeconomic History   Marital status: Divorced    Spouse name: Not on file   Number of children: Not on file   Years of education: Not on file   Highest education level: Not on file  Occupational History   Not on file  Tobacco Use   Smoking status: Never   Smokeless tobacco: Never  Vaping Use   Vaping status: Never Used  Substance and Sexual Activity   Alcohol use: Not Currently    Alcohol/week: 14.0 standard drinks of alcohol    Types: 14 Cans of beer per week    Comment: patient endorses at least 1-2 beers daily (04/2020); 03/2023   Drug use: No   Sexual activity: Not Currently  Other Topics Concern   Not on file  Social History Narrative   Lives at home with his brother who is crippled. He takes care of his brother.    Right handed    Social Drivers of Health   Financial  Resource Strain: High Risk (02/15/2023)   Overall Financial Resource Strain (CARDIA)    Difficulty of Paying Living Expenses: Very hard  Food Insecurity: No Food Insecurity (08/06/2024)   Hunger Vital Sign    Worried About Running Out of Food in the Last Year: Never true    Ran Out of Food in the Last Year: Never true  Transportation Needs: Unmet Transportation Needs (08/14/2024)   PRAPARE - Administrator, Civil Service (Medical): Yes    Lack of Transportation (Non-Medical): Yes  Physical Activity: Not on file  Stress: Not on file  Social Connections: Not on file  Intimate Partner Violence: Not At Risk (08/06/2024)   Humiliation, Afraid,  Rape, and Kick questionnaire    Fear of Current or Ex-Partner: No    Emotionally Abused: No    Physically Abused: No    Sexually Abused: No   FH:  Family History  Problem Relation Age of Onset   Alzheimer's disease Mother    Lung disease Father    Heart attack Other    Diabetes Neg Hx    Colon cancer Neg Hx    Esophageal cancer Neg Hx    Pancreatic cancer Neg Hx    Stomach cancer Neg Hx    Past Medical History:  Diagnosis Date   AICD (automatic cardioverter/defibrillator) present    Alcohol abuse 05/18/2020   Arthritis    hands; maybe my toes (12/20/2017)   CAD (coronary artery disease)    Cardiac arrest (HCC) 11/29/2017   hx VF arrest/notes 12/20/2017   CHF (congestive heart failure) (HCC)    Chronic systolic heart failure (HCC) 08/25/2019   Coronary artery disease    Coronary artery disease involving native coronary artery of native heart without angina pectoris 12/07/2017   Essential hypertension 05/18/2020   GERD (gastroesophageal reflux disease)    H/O ETOH abuse    /notes 12/20/2017   High cholesterol    Hypertension    Ischemic cardiomyopathy    a. 08/2019 s/p BSX D232 single lead AICD.   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) 12/30/2019   Syncope and collapse    last few days (12/20/2017)   Current Outpatient Medications  Medication Sig Dispense Refill   ASPIRIN  LOW DOSE 81 MG tablet TAKE 1 TABLET (81 MG TOTAL) BY MOUTH DAILY. SWALLOW WHOLE. (AM) 30 tablet 11   atorvastatin  (LIPITOR ) 80 MG tablet Take 1 tablet (80 mg total) by mouth daily. 90 tablet 0   empagliflozin  (JARDIANCE ) 10 MG TABS tablet Take 1 tablet (10 mg total) by mouth daily. 90 tablet 0   ezetimibe  (ZETIA ) 10 MG tablet Take 1 tablet (10 mg total) by mouth daily. 30 tablet 11    levETIRAcetam  (KEPPRA ) 500 MG tablet Take 1 tablet (500 mg total) by mouth 2 (two) times daily. 60 tablet 11   losartan  (COZAAR ) 25 MG tablet Take 0.5 tablets (12.5 mg total) by mouth daily. 45 tablet 3   metoprolol  succinate (TOPROL -XL) 25 MG 24 hr tablet Take 0.5 tablets (12.5 mg total) by mouth daily. 45 tablet 0   Multiple Vitamins-Minerals (MULTIVITAMIN WITH MINERALS) tablet Take 1 tablet by mouth daily. 90 tablet 0   potassium chloride  SA (KLOR-CON  M) 20 MEQ tablet Take 2 tablets (40 mEq total) by mouth daily. 60 tablet  11   torsemide  (DEMADEX ) 20 MG tablet Take 3 tablets (60 mg total) by mouth daily. 270 tablet 0   No current facility-administered medications for this visit.   There were no vitals taken for this visit.  Wt Readings from Last 3 Encounters:  08/14/24 66.1 kg (145 lb 12.8 oz)  08/06/24 67 kg (147 lb 9.6 oz)  08/05/24 67.6 kg (149 lb)   PHYSICAL EXAM: General:  Chronically ill appearing Cor: No JVD. Regular rate & rhythm. No murmurs. Lungs: clear Abdomen: soft, nontender, nondistended.  Extremities: no edema Neuro: alert & orientedx3. Affect pleasant  [place] Sci ICD Interrogation: HL was elevated to ~ 40 in early August, now trending down (13 today). Thoracic impedance trending up.   ASSESSMENT & PLAN: 1. Chronic Biventricular HF, ICM  - Echo 11/30/17 25-30% RV normal.   - Echo 03/2018: EF 40-45%  RV normal.  - Echo 5/20 EF ~ 25%. Has ICD.  - Echo 12/2019 EF 30-35% RV normal. - Mixed Ischemic + probable ETOH mediated component  - Echo (10/23): biventricular failure getting worse from LVEF 30-35% Mod RV--> LVEF < 20% severely reduced RV.  - Echo (6/25): EF < 20%, RV moderately reduced  - NYHA II. Volume okay on exam. ReDS normal at 33%. HL on his ICD had been elevated earlier this month, now trending down. - Continue Torsemide  60 mg daily - Continue Jardiance  10 mg daily - Restart losartan  at lower dose 12.5 mg daily (may have to wait a few weeks for this to be  included in bubble packs, do not think he can manage on his own) - Continue Toprol  XL 12.5 mg daily - Now off digoxin  d/t elevated level and poor compliance  - No spiro with h/o gynecomastia. Could consider Inspra  but would avoid given h/o poor compliance  - Not a candidate for advanced therapies/transplant with ongoing ETOH/substance abuse and compliance (this includes home inotrope).  - BMET/BNP today  2. CAD - S/P CABG x 3 2018  - denies CP  - Continue ASA 81 mg  - Continue Lipitor  80 mg.  - Restart Zetia  10 mg daily (will need to be added to bubble packs)   3. Seizure Disorder - has full bottle of Keppra  but not taking - he will try to remember to take it BID until it can be added to bubble packs. Will send one time order to [facility] for bubble packs but needs f/u with Neurology and/or PCP   4. ETOH Abuse - Long hx of ETOH abuse. Reports he has not been drinking any alcohol recently.   5. Substance abuse - + for methamphetamine during recent admit - Denies any recent drug use  SDOH - followed by HF SW team. Given food bag last week. SW assists with arranging transportation. His pharmacy was contacted by HF SW to update his bubble packs with med changes and change his home address.  F/u 4 weeks with APP to assess volume and compliance  Harlene CHRISTELLA Gainer, PA-C  09/03/24

## 2024-09-04 ENCOUNTER — Telehealth (HOSPITAL_COMMUNITY): Payer: Self-pay | Admitting: Licensed Clinical Social Worker

## 2024-09-04 NOTE — Telephone Encounter (Signed)
 CSW confirmed through [facility] inmate registry that pt is still in jail at this time.  CSW informed clinic staff of pt inability to come to appt tomorrow.  Called [facility] Medicaid and canceled ride to appt.  Will continue to check in and try to get pt back into clinic when released from jail.  Andriette HILARIO Leech, LCSW Clinical Social Worker Advanced [facility] Failure Clinic Desk#: [phone] Cell#: [phone]

## 2024-09-05 ENCOUNTER — Encounter (HOSPITAL_COMMUNITY)

## 2024-09-17 NOTE — Progress Notes (Signed)
Remote ICD Transmission.

## 2024-10-02 DIAGNOSIS — I509 Heart failure, unspecified: Secondary | ICD-10-CM | POA: Insufficient documentation

## 2024-10-02 DIAGNOSIS — M1990 Unspecified osteoarthritis, unspecified site: Secondary | ICD-10-CM | POA: Insufficient documentation

## 2024-10-02 DIAGNOSIS — K219 Gastro-esophageal reflux disease without esophagitis: Secondary | ICD-10-CM | POA: Insufficient documentation

## 2024-10-02 DIAGNOSIS — R55 Syncope and collapse: Secondary | ICD-10-CM | POA: Insufficient documentation

## 2024-10-02 DIAGNOSIS — I255 Ischemic cardiomyopathy: Secondary | ICD-10-CM | POA: Insufficient documentation

## 2024-10-02 DIAGNOSIS — R569 Unspecified convulsions: Secondary | ICD-10-CM | POA: Insufficient documentation

## 2024-10-02 DIAGNOSIS — F1011 Alcohol abuse, in remission: Secondary | ICD-10-CM | POA: Insufficient documentation

## 2024-10-02 DIAGNOSIS — E7800 Pure hypercholesterolemia, unspecified: Secondary | ICD-10-CM | POA: Insufficient documentation

## 2024-10-06 ENCOUNTER — Ambulatory Visit: Payer: Self-pay

## 2024-10-06 VITALS — BP 100/66 | HR 63 | Ht 70.0 in | Wt 147.6 lb

## 2024-10-06 DIAGNOSIS — I2510 Atherosclerotic heart disease of native coronary artery without angina pectoris: Secondary | ICD-10-CM

## 2024-10-06 DIAGNOSIS — I5022 Chronic systolic (congestive) heart failure: Secondary | ICD-10-CM

## 2024-10-06 DIAGNOSIS — E782 Mixed hyperlipidemia: Secondary | ICD-10-CM

## 2024-10-06 DIAGNOSIS — G459 Transient cerebral ischemic attack, unspecified: Secondary | ICD-10-CM

## 2024-10-06 NOTE — Assessment & Plan Note (Signed)
 Appears euvolemic and compensated.  Currently not on torsemide  60 mg once daily. Doing well. Continue with low-salt diet less than 2 g/day.  Guideline directed medical therapy remains on metoprolol  XL 12.5 mg once daily Losartan  12 5 mg once daily Jardiance  10 mg once daily.  Currently not on spironolactone . This can be started once he is discharged from the present and reestablishes care with advanced heart failure clinic tentatively in 1 month.

## 2024-10-06 NOTE — Patient Instructions (Addendum)
 Medication Instructions:  Your physician recommends that you continue on your current medications as directed. Please refer to the Current Medication list given to you today.  *If you need a refill on your cardiac medications before your next appointment, please call your pharmacy*  Lab Work: None If you have labs (blood work) drawn today and your tests are completely normal, you will receive your results only by: MyChart Message (if you have MyChart) OR A paper copy in the mail If you have any lab test that is abnormal or we need to change your treatment, we will call you to review the results.  Testing/Procedures: None  Follow-Up: At [facility], you and your health needs are our priority.  As part of our continuing mission to provide you with exceptional heart care, our providers are all part of one team.  This team includes your primary Cardiologist (physician) and Advanced Practice Providers or APPs (Physician Assistants and Nurse Practitioners) who all work together to provide you with the care you need, when you need it.  Your next appointment:   6 month(s)  Provider:   Alean Kobus, MD    We recommend signing up for the patient portal called MyChart.  Sign up information is provided on this After Visit Summary.  MyChart is used to connect with patients for Virtual Visits (Telemedicine).  Patients are able to view lab/test results, encounter notes, upcoming appointments, etc.  Non-urgent messages can be sent to your provider as well.   To learn more about what you can do with MyChart, go to [email or web address].   Other Instructions Keep appointment in 1 month that is already scheduled with Advanced Heart Failure Team  Jose HILARIO Leech, LCSW Clinical Social Worker Advanced [facility] Clinic Desk#: [phone] Cell#: [phone]

## 2024-10-06 NOTE — Assessment & Plan Note (Signed)
 Currently on dual antiplatelet therapy for 21 days given recent TIA 09/24/2024.  Continue with aspirin  81 mg indefinitely.

## 2024-10-06 NOTE — Assessment & Plan Note (Signed)
 Continue with dual antiplatelet therapy as discussed below under TIA. Continue atorvastatin  80 mg once daily. No anginal symptoms.

## 2024-10-06 NOTE — Progress Notes (Signed)
 Cardiology Consultation:    Date:  10/06/2024   ID:  [name], DOB 07/28/1961, MRN [id number]  PCP:  Pcp, No  Cardiologist:  Jose JONELLE Kobus, MD   Referring MD: No ref. provider found   No chief complaint on file.    ASSESSMENT AND PLAN:   [name] 63 year old male with history of alcohol abuse, out-of-hospital VF arrest in 2018, cath showed triple-vessel disease CAD s/p CABG 12/07/2017, ischemic cardiomyopathy with reduced LVEF 25 to 30%.,  S/p ICD implant September 2020, nonadherence, hypertension, hyperlipidemia, GERD, TIA October 2025. Currently incarcerated here for a visit after recent discharge from the hospital following his TIA. Anticipated to be discharged from the present in 10 days.   Problem List Items Addressed This Visit     Chronic systolic heart failure (HCC) - Primary   Appears euvolemic and compensated.  Currently not on torsemide  60 mg once daily. Doing well. Continue with low-salt diet less than 2 g/day.  Guideline directed medical therapy remains on metoprolol  XL 12.5 mg once daily Losartan  12 5 mg once daily Jardiance  10 mg once daily.  Currently not on spironolactone . This can be started once he is discharged from the present and reestablishes care with advanced heart failure clinic tentatively in 1 month.        Mixed hyperlipidemia   Continue with atorvastatin  80 mg once daily and Zetia  10 mg once daily. Last lipid panel 08/05/2024 total cholesterol 134, HDL 28, LDL 91, triglycerides 76.       CAD (coronary artery disease)   Continue with dual antiplatelet therapy as discussed below under TIA. Continue atorvastatin  80 mg once daily. No anginal symptoms.       TIA (transient ischemic attack)   Currently on dual antiplatelet therapy for 21 days given recent TIA 09/24/2024.  Continue with aspirin  81 mg indefinitely.       Return for follow-up with advanced heart failure team tentatively in 1 month. He is anticipated  to be discharged next week from the present. Can follow-up with me in the office tentatively in 6 months.   History of Present Illness:    [name] is a 63 y.o. male who is being seen today for follow-up visit. Previously followed up with advanced heart failure team with Dr. [name].  Last office visit 08/14/2024.  Complicated medical history. History of alcohol abuse, out-of-hospital VF arrest in 2018, cath showed triple-vessel disease CAD s/p CABG 12/07/2017, ischemic cardiomyopathy with reduced LVEF 25 to 30%.,  S/p ICD implant September 2020, nonadherence, hypertension, hyperlipidemia, GERD,  Was recently admitted and discharged from [facility] between 09/24/2024 through 09/26/2024 for new onset right-sided weakness diagnosed with CVA not candidate for tPA, continued as TIA by neurology and recommended dual antiplatelet therapy for 21 days followed by aspirin  alone, echocardiogram ran out of the hospital noted LVEF 20 to 25%.  Here for a follow-up visit.  Accompanied by officer from the jail at [facility]. He is currently been incarcerated and anticipated to be discharged in about 10 days.  Lives in [place] with his girlfriend.  Currently mentions other than feeling tired he does not have any active cardiac symptoms. His numbness and dizziness symptoms without the initial reasons for admission at [facility] where he was diagnosed with TIA.  No chest pain or shortness of breath. No ICD discharge.  Able to take his medications at the present as prescribed.    Past Medical History:  Diagnosis Date  Acute exacerbation of CHF (congestive heart failure) (HCC) 06/12/2024   Acute hypoxic respiratory failure (HCC) 06/12/2024   Acute on chronic combined systolic and diastolic CHF (congestive heart failure) (HCC) 09/30/2022   AICD (automatic cardioverter/defibrillator) present    Alcohol abuse 05/18/2020   Alcohol use 06/12/2024    Arthritis    hands; maybe my toes (12/20/2017)   CAD (coronary artery disease)    Cardiac arrest (HCC) 11/29/2017   hx VF arrest/notes 12/20/2017   Cardiac arrest, cause unspecified (HCC) 07/06/2020   Cellulitis of left upper extremity 05/18/2020   CHF (congestive heart failure) (HCC)    Chronic systolic heart failure (HCC) 08/25/2019   Closed fracture of body of left scapula 08/19/2015   Closed fracture of right upper limb 01/27/2021   Coronary artery disease    Coronary artery disease involving native coronary artery of native heart without angina pectoris 12/07/2017   Elevated AST (SGOT) 08/14/2020   Essential hypertension 05/18/2020   GERD (gastroesophageal reflux disease)    H/O ETOH abuse    /notes 12/20/2017   H/O medication noncompliance 06/12/2024   High cholesterol    History of CAD (coronary artery disease) 06/12/2024   History of ventricular fibrillation 06/12/2024   Hypertension    Hypokalemia 09/30/2022   Hyponatremia 08/14/2020   Injury to ligament of cervical spine 08/19/2015   Ischemic cardiomyopathy    a. 08/2019 s/p BSX D232 single lead AICD.   Lactic acidosis 05/18/2020   Left arm cellulitis 05/19/2020   Mixed hyperlipidemia 05/18/2020   Pneumonia due to COVID-19 virus 08/14/2020   Prolonged QT interval 08/14/2020   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/12/2017   Seizure (HCC) 10/21/2019   Seizures (HCC) ~ 2016; ~ 2017   I've had a couple; I was at work (12/20/2017)   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, 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) 12/30/2019   Syncope and collapse    last few days (12/20/2017)   Thrombocytopenia 08/18/2015   TIA (transient ischemic attack) 08/18/2015   Uninsured 08/31/2015   Volume depletion 08/18/2015    Past Surgical History:  Procedure Laterality Date   CARDIAC CATHETERIZATION     CORONARY ARTERY BYPASS GRAFT N/A 12/07/2017   Procedure: CORONARY ARTERY BYPASS GRAFTING (CABG) times four using left  internal mammary artery and right saphenous vein using endoscope for harvest.;  Surgeon: Fleeta Hanford Coy, MD;  Location: [facility] OR;  Service: Open Heart Surgery;  Laterality: N/A;   I & D EXTREMITY Left 05/20/2020   Procedure: IRRIGATION AND DEBRIDEMENT OF WRIST;  Surgeon: Carolee Lynwood JINNY DOUGLAS, MD;  Location: [facility] OR;  Service: Orthopedics;  Laterality: Left;   IABP INSERTION N/A 11/29/2017   Procedure: IABP Insertion;  Surgeon: Mady Bruckner, MD;  Location: [facility] INVASIVE CV LAB;  Service: Cardiovascular;  Laterality: N/A;   IABP INSERTION N/A 12/03/2017   Procedure: IABP INSERTION;  Surgeon: Cherrie Toribio SAUNDERS, MD;  Location: [facility] INVASIVE CV LAB;  Service: Cardiovascular;  Laterality: N/A;   ICD IMPLANT N/A 08/29/2019   Procedure: ICD IMPLANT;  Surgeon: Waddell Danelle ORN, MD;  Location: [facility] INVASIVE CV LAB;  Service: Cardiovascular;  Laterality: N/A;   LEFT HEART CATH AND CORONARY ANGIOGRAPHY N/A 11/29/2017   Procedure: LEFT HEART CATH AND CORONARY ANGIOGRAPHY;  Surgeon: Mady Bruckner, MD;  Location: [facility] INVASIVE CV LAB;  Service: Cardiovascular;  Laterality: N/A;   RIGHT HEART CATH N/A 11/29/2017   Procedure: RIGHT HEART CATH;  Surgeon: Cherrie Toribio SAUNDERS, MD;  Location: [facility] INVASIVE CV LAB;  Service: Cardiovascular;  Laterality: N/A;   TEE WITHOUT CARDIOVERSION N/A 12/07/2017   Procedure: TRANSESOPHAGEAL ECHOCARDIOGRAM (TEE);  Surgeon: Fleeta Ochoa, Maude, MD;  Location: [facility] OR;  Service: Open Heart Surgery;  Laterality: N/A;   TONSILLECTOMY AND ADENOIDECTOMY  ~ 1972    Current Medications: Current Meds  Medication Sig   ASPIRIN  LOW DOSE 81 MG tablet TAKE 1 TABLET (81 MG TOTAL) BY MOUTH DAILY. SWALLOW WHOLE. (AM)   atorvastatin  (LIPITOR ) 80 MG tablet Take 1 tablet (80 mg total) by mouth daily.   empagliflozin  (JARDIANCE ) 10 MG TABS tablet Take 1 tablet (10 mg total) by mouth daily.   ezetimibe  (ZETIA ) 10 MG tablet Take 1 tablet (10 mg total) by mouth daily.   levETIRAcetam  (KEPPRA ) 500 MG tablet Take  1 tablet (500 mg total) by mouth 2 (two) times daily.   losartan  (COZAAR ) 25 MG tablet Take 0.5 tablets (12.5 mg total) by mouth daily.   metoprolol  succinate (TOPROL -XL) 25 MG 24 hr tablet Take 0.5 tablets (12.5 mg total) by mouth daily.   Multiple Vitamins-Minerals (MULTIVITAMIN WITH MINERALS) tablet Take 1 tablet by mouth daily.   potassium chloride  SA (KLOR-CON  M) 20 MEQ tablet Take 2 tablets (40 mEq total) by mouth daily.   torsemide  (DEMADEX ) 20 MG tablet Take 3 tablets (60 mg total) by mouth daily.     Allergies:   Aldactone  [spironolactone ]   Social History   Socioeconomic History   Marital status: Divorced    Spouse name: Not on file   Number of children: Not on file   Years of education: Not on file   Highest education level: Not on file  Occupational History   Not on file  Tobacco Use   Smoking status: Never   Smokeless tobacco: Never  Vaping Use   Vaping status: Never Used  Substance and Sexual Activity   Alcohol use: Not Currently    Alcohol/week: 14.0 standard drinks of alcohol    Types: 14 Cans of beer per week    Comment: patient endorses at least 1-2 beers daily (04/2020); 03/2023   Drug use: No   Sexual activity: Not Currently  Other Topics Concern   Not on file  Social History Narrative   Lives at home with his brother who is crippled. He takes care of his brother.    Right handed    Social Drivers of Health   Financial Resource Strain: High Risk (02/15/2023)   Overall Financial Resource Strain (CARDIA)    Difficulty of Paying Living Expenses: Very hard  Food Insecurity: No Food Insecurity (08/06/2024)   Hunger Vital Sign    Worried About Running Out of Food in the Last Year: Never true    Ran Out of Food in the Last Year: Never true  Transportation Needs: Unmet Transportation Needs (08/14/2024)   PRAPARE - Administrator, Civil Service (Medical): Yes    Lack of Transportation (Non-Medical): Yes  Physical Activity: Not on file  Stress: Not  on file  Social Connections: Not on file     Family History: The patient's family history includes Alzheimer's disease in his mother; Heart attack in an other family member; Lung disease in his father. There is no history of Diabetes, Colon cancer, Esophageal cancer, Pancreatic cancer, or Stomach cancer. ROS:   Please see the history of present illness.    All 14 point review of systems negative except as described per history of present illness.  EKGs/Labs/Other Studies Reviewed:    The following studies were  reviewed today:   EKG:       Recent Labs: 08/05/2024: Magnesium  1.8; TSH 2.349 08/06/2024: ALT 57; Hemoglobin 13.4; Platelets 236 08/14/2024: B Natriuretic Peptide 489.9; BUN 20; Creatinine, Ser 1.53; Potassium 2.8; Sodium 135  Recent Lipid Panel    Component Value Date/Time   CHOL 134 08/05/2024 1149   CHOL 195 03/09/2020 1053   TRIG 76 08/05/2024 1149   HDL 28 (L) 08/05/2024 1149   HDL 95 03/09/2020 1053   CHOLHDL 4.8 08/05/2024 1149   VLDL 15 08/05/2024 1149   LDLCALC 91 08/05/2024 1149   LDLCALC 86 03/09/2020 1053    Physical Exam:    VS:  BP 100/66   Pulse 63   Ht 5' 10 (1.778 m)   Wt 147 lb 9.6 oz (67 kg)   SpO2 94%   BMI 21.18 kg/m     Wt Readings from Last 3 Encounters:  10/06/24 147 lb 9.6 oz (67 kg)  08/14/24 145 lb 12.8 oz (66.1 kg)  08/06/24 147 lb 9.6 oz (67 kg)     GENERAL:  Well nourished, well developed in no acute distress NECK: No JVD; No carotid bruits CARDIAC: RRR, S1 and S2 present, no murmurs, no rubs, no gallops Left infraclavicular ICD generator palpable.  CHEST:  Clear to auscultation without rales, wheezing or rhonchi  Extremities: No pitting pedal edema. Pulses bilaterally symmetric with radial 2+ and dorsalis pedis 2+ NEUROLOGIC:  Alert and oriented x 3  Medication Adjustments/Labs and Tests Ordered: Current medicines are reviewed at length with the patient today.  Concerns regarding medicines are outlined above.  No orders  of the defined types were placed in this encounter.  No orders of the defined types were placed in this encounter.   Signed, Jose jess Kobus, MD, MPH, [facility] 10/06/2024 4:31 PM    [facility]

## 2024-10-06 NOTE — Assessment & Plan Note (Signed)
 Continue with atorvastatin  80 mg once daily and Zetia  10 mg once daily. Last lipid panel 08/05/2024 total cholesterol 134, HDL 28, LDL 91, triglycerides 76.

## 2024-10-16 ENCOUNTER — Telehealth (HOSPITAL_COMMUNITY): Payer: Self-pay | Admitting: Licensed Clinical Social Worker

## 2024-10-16 NOTE — Telephone Encounter (Signed)
 CSW received call from [facility] inquiring about patient appointments with HF Clinic (medical unit [phone]).  No current appts but per note from [facility] on 10/13 recommend f/up with HF clinic in 1 month.  CSW had clinic staff schedule appt for patient and informed [facility] of appointment.  If patient is released before that time he will be provided appointment information.  CSW will continue to follow and assist as needed  Andriette HILARIO Leech, LCSW Clinical Social Worker Advanced Heart Failure Clinic Desk#: [phone] Cell#: [phone]

## 2024-11-03 NOTE — Progress Notes (Incomplete)
 ADVANCED HF CLINIC NOTE   PCP: Haze Servant, NP HF Cardiologist: Dr Cherrie   HPI: [name] is a 63 y.o. male with h/o ETOH abuse, VF Arrest 2018, CAD s/p CABG 12/07/17, and ICM with EF 25-30%.   Admitted 12/18 with VF arrest while chopping wood. Received CPR in the field. R/LHC with severe 3vD as below and cardiogenic shock requiring IABP and milrinone . Required milrinone  with continued shock. Pt improved and IABP removed 12/10, but pt had recurrent VF arrest so IABP replaced. Stabilized and underwent CABG 12/07/17. Tolerated gradual wean of meds and extubation with no further events.  Echo 5/20 EF at 25%.    9/20 implantation of a [employer] single chamber ICD.   Routine clinic f/u on 10/23/19 and while in exam room had a witnessed seizure with loss of pulse. CPR started with quick ROSC. He did not require shock. Taken to the ED and had another seizure. Neurology consulted. Started on Keppra . ICD interrogated. No arrhythmias noted. Admitted to [facility] on 12/09/19 with recurrent seizures in setting of noncompliance with his Keppra .    Admitted 8/21 with COVID PNA.   Follow up 5/22 he was having facial and arm tingling, no other neuro symptoms. Ethanol level 145.   He's had numerous clinic visits and admissions over the last few years with volume overload in setting of noncompliance with medical therapy.  Admitted 6/25 w/ a/c CHF, in the setting of med nonadherence. Had reportedly been out of meds x 1 month. Echo w/ EF less than 20%, moderately reduced RV. Diuresed w/ IV Lasix  and started back on HF GDMT.  D/c wt 146 lb.   Seen for follow-up 08/05/24 and was off all medications after running out.  He was sent to the ED after clinic labs resulted for severe hypokalemia. He was admitted overnight, K repleted. UDS + for methamphetamine  Here today for CHF follow-up. Feels okay from HF standpoint. Denies shortness of breath, orthopnea, PND and lower extremity  edema. He gets his meds bubble packed from [facility]. Keppra , Zetia  and Losartan  were not included in his bubble packs. He had been out of his meds until a few days ago. The bubble packs were delivered to his old address and were just brought to him by a neighbor.   Rents a trailer with his girlfriend. Has medicaid. He denies ETOH and tobacco use.   ROS: All systems negative except as listed in HPI, PMH and Problem List.  SH:  Social History   Socioeconomic History   Marital status: Divorced    Spouse name: Not on file   Number of children: Not on file   Years of education: Not on file   Highest education level: Not on file  Occupational History   Not on file  Tobacco Use   Smoking status: Never   Smokeless tobacco: Never  Vaping Use   Vaping status: Never Used  Substance and Sexual Activity   Alcohol use: Not Currently    Alcohol/week: 14.0 standard drinks of alcohol    Types: 14 Cans of beer per week    Comment: patient endorses at least 1-2 beers daily (04/2020); 03/2023   Drug use: No   Sexual activity: Not Currently  Other Topics Concern   Not on file  Social History Narrative   Lives at home with his brother who is crippled. He takes care of his brother.    Right handed    Social Drivers of Health   Financial  Resource Strain: High Risk (02/15/2023)   Overall Financial Resource Strain (CARDIA)    Difficulty of Paying Living Expenses: Very hard  Food Insecurity: No Food Insecurity (08/06/2024)   Hunger Vital Sign    Worried About Running Out of Food in the Last Year: Never true    Ran Out of Food in the Last Year: Never true  Transportation Needs: Unmet Transportation Needs (08/14/2024)   PRAPARE - Transportation    Lack of Transportation (Medical): Yes    Lack of Transportation (Non-Medical): Yes  Physical Activity: Not on file  Stress: Not on file  Social Connections: Not on file  Intimate Partner Violence: Not At Risk (08/06/2024)   Humiliation, Afraid,  Rape, and Kick questionnaire    Fear of Current or Ex-Partner: No    Emotionally Abused: No    Physically Abused: No    Sexually Abused: No   FH:  Family History  Problem Relation Age of Onset   Alzheimer's disease Mother    Lung disease Father    Heart attack Other    Diabetes Neg Hx    Colon cancer Neg Hx    Esophageal cancer Neg Hx    Pancreatic cancer Neg Hx    Stomach cancer Neg Hx    Past Medical History:  Diagnosis Date   Acute exacerbation of CHF (congestive heart failure) (HCC) 06/12/2024   Acute hypoxic respiratory failure (HCC) 06/12/2024   Acute on chronic combined systolic and diastolic CHF (congestive heart failure) (HCC) 09/30/2022   AICD (automatic cardioverter/defibrillator) present    Alcohol abuse 05/18/2020   Alcohol use 06/12/2024   Arthritis    hands; maybe my toes (12/20/2017)   CAD (coronary artery disease)    Cardiac arrest (HCC) 11/29/2017   hx VF arrest/notes 12/20/2017   Cardiac arrest, cause unspecified (HCC) 07/06/2020   Cellulitis of left upper extremity 05/18/2020   CHF (congestive heart failure) (HCC)    Chronic systolic heart failure (HCC) 08/25/2019   Closed fracture of body of left scapula 08/19/2015   Closed fracture of right upper limb 01/27/2021   Coronary artery disease    Coronary artery disease involving native coronary artery of native heart without angina pectoris 12/07/2017   Elevated AST (SGOT) 08/14/2020   Essential hypertension 05/18/2020   GERD (gastroesophageal reflux disease)    H/O ETOH abuse    /notes 12/20/2017   H/O medication noncompliance 06/12/2024   High cholesterol    History of CAD (coronary artery disease) 06/12/2024   History of ventricular fibrillation 06/12/2024   Hypertension    Hypokalemia 09/30/2022   Hyponatremia 08/14/2020   Injury to ligament of cervical spine 08/19/2015   Ischemic cardiomyopathy    a. 08/2019 s/p BSX D232 single lead AICD.   Lactic acidosis 05/18/2020   Left arm cellulitis  05/19/2020   Mixed hyperlipidemia 05/18/2020   Pneumonia due to COVID-19 virus 08/14/2020   Prolonged QT interval 08/14/2020   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/12/2017   Seizure (HCC) 10/21/2019   Seizures (HCC) ~ 2016; ~ 2017   I've had a couple; I was at work (12/20/2017)   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, 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) 12/30/2019   Syncope and collapse    last few days (12/20/2017)   Thrombocytopenia 08/18/2015   TIA (transient ischemic attack) 08/18/2015   Uninsured 08/31/2015   Volume depletion 08/18/2015   Current Outpatient Medications  Medication Sig Dispense Refill   ASPIRIN  LOW DOSE 81 MG tablet TAKE 1 TABLET (  81 MG TOTAL) BY MOUTH DAILY. SWALLOW WHOLE. (AM) 30 tablet 11   atorvastatin  (LIPITOR ) 80 MG tablet Take 1 tablet (80 mg total) by mouth daily. 90 tablet 0   empagliflozin  (JARDIANCE ) 10 MG TABS tablet Take 1 tablet (10 mg total) by mouth daily. 90 tablet 0   ezetimibe  (ZETIA ) 10 MG tablet Take 1 tablet (10 mg total) by mouth daily. 30 tablet 11   levETIRAcetam  (KEPPRA ) 500 MG tablet Take 1 tablet (500 mg total) by mouth 2 (two) times daily. 60 tablet 11   losartan  (COZAAR ) 25 MG tablet Take 0.5 tablets (12.5 mg total) by mouth daily. 45 tablet 3   metoprolol  succinate (TOPROL -XL) 25 MG 24 hr tablet Take 0.5 tablets (12.5 mg total) by mouth daily. 45 tablet 0   Multiple Vitamins-Minerals (MULTIVITAMIN WITH MINERALS) tablet Take 1 tablet by mouth daily. 90 tablet 0   potassium chloride  SA (KLOR-CON  M) 20 MEQ tablet Take 2 tablets (40 mEq total) by mouth daily. 60 tablet 11   torsemide  (DEMADEX ) 20 MG tablet Take 3 tablets (60 mg total) by mouth daily. 270 tablet 0   No current facility-administered medications for this visit.   There were no vitals taken for this visit.  Wt Readings from Last 3 Encounters:  10/06/24 67 kg (147 lb 9.6 oz)  08/14/24 66.1 kg (145 lb 12.8 oz)  08/06/24 67 kg (147 lb 9.6 oz)   PHYSICAL  EXAM: General:  Chronically ill appearing Cor: No JVD. Regular rate & rhythm. No murmurs. Lungs: clear Abdomen: soft, nontender, nondistended.  Extremities: no edema Neuro: alert & orientedx3. Affect pleasant  [place] Sci ICD Interrogation: HL was elevated to ~ 40 in early August, now trending down (13 today). Thoracic impedance trending up.   ASSESSMENT & PLAN: 1. Chronic Biventricular HF, ICM  - Echo 11/30/17 25-30% RV normal.   - Echo 03/2018: EF 40-45%  RV normal.  - Echo 5/20 EF ~ 25%. Has ICD.  - Echo 12/2019 EF 30-35% RV normal. - Mixed Ischemic + probable ETOH mediated component  - Echo (10/23): biventricular failure getting worse from LVEF 30-35% Mod RV--> LVEF < 20% severely reduced RV.  - Echo (6/25): EF < 20%, RV moderately reduced  - NYHA II. Volume okay on exam. ReDS normal at 33%. HL on his ICD had been elevated earlier this month, now trending down. - Continue Torsemide  60 mg daily - Continue Jardiance  10 mg daily - Restart losartan  at lower dose 12.5 mg daily (may have to wait a few weeks for this to be included in bubble packs, do not think he can manage on his own) - Continue Toprol  XL 12.5 mg daily - Now off digoxin  d/t elevated level and poor compliance  - No spiro with h/o gynecomastia. Could consider Inspra  but would avoid given h/o poor compliance  - Not a candidate for advanced therapies/transplant with ongoing ETOH/substance abuse and compliance (this includes home inotrope).  - BMET/BNP today  2. CAD - S/P CABG x 3 2018  - denies CP  - Continue ASA 81 mg  - Continue Lipitor  80 mg.  - Restart Zetia  10 mg daily (will need to be added to bubble packs)   3. Seizure Disorder - has full bottle of Keppra  but not taking - he will try to remember to take it BID until it can be added to bubble packs. Will send one time order to [facility] for bubble packs but needs f/u with Neurology and/or PCP   4. ETOH Abuse -  Long hx of ETOH abuse. Reports he has not been drinking  any alcohol recently.   5. Substance abuse - + for methamphetamine during recent admit - Denies any recent drug use  SDOH - followed by HF SW team. Given food bag last week. SW assists with arranging transportation. His pharmacy was contacted by HF SW to update his bubble packs with med changes and change his home address.  F/u 4 weeks with APP to assess volume and compliance  Harlene CHRISTELLA Gainer, PA-C  11/03/24

## 2024-11-04 ENCOUNTER — Telehealth: Payer: Self-pay

## 2024-11-04 NOTE — Telephone Encounter (Signed)
 Called to confirm/remind patient of their appointment at the [facility] Clinic on 11/05/24.   Appointment:   [] Confirmed  [x] Left mess   [] No answer/No voice mail  [] VM Full/unable to leave message  [] Phone not in service  And to bring in all medications and/or complete list.

## 2024-11-05 ENCOUNTER — Ambulatory Visit (HOSPITAL_COMMUNITY)
Admission: RE | Admit: 2024-11-05 | Discharge: 2024-11-05 | Disposition: A | Discharge status: Home / Self Care | Payer: Self-pay | Source: Ambulatory Visit

## 2024-11-13 ENCOUNTER — Ambulatory Visit: Payer: Self-pay

## 2024-11-14 ENCOUNTER — Telehealth (HOSPITAL_COMMUNITY): Payer: Self-pay

## 2024-11-14 ENCOUNTER — Ambulatory Visit (HOSPITAL_COMMUNITY): Payer: Self-pay

## 2024-11-14 NOTE — Telephone Encounter (Signed)
 Called to confirm/remind patient of their appointment at the [facility] on 11/14/24 2:00.   Appointment:   [] Confirmed  [] Left mess   [] No answer/No voice mail  [] VM Full/unable to leave message  [x] Phone not in service  Patient reminded to bring all medications and/or complete list.  Confirmed patient has transportation. Gave directions, instructed to utilize valet parking.

## 2024-11-14 NOTE — Progress Notes (Incomplete)
 ADVANCED HF CLINIC NOTE   PCP: Haze Servant, NP HF Cardiologist: Dr Cherrie   HPI: [name] is a 63 y.o. male with h/o ETOH abuse, VF Arrest 2018, CAD s/p CABG 12/07/17, and ICM with EF 25-30%.   Admitted 12/18 with VF arrest while chopping wood. Received CPR in the field. R/LHC with severe 3vD as below and cardiogenic shock requiring IABP and milrinone . Required milrinone  with continued shock. Pt improved and IABP removed 12/10, but pt had recurrent VF arrest so IABP replaced. Stabilized and underwent CABG 12/07/17. Tolerated gradual wean of meds and extubation with no further events.  Echo 5/20 EF at 25%.    9/20 implantation of a [employer] single chamber ICD.   Routine clinic f/u on 10/23/19 and while in exam room had a witnessed seizure with loss of pulse. CPR started with quick ROSC. He did not require shock. Taken to the ED and had another seizure. Neurology consulted. Started on Keppra . ICD interrogated. No arrhythmias noted. Admitted to [facility] on 12/09/19 with recurrent seizures in setting of noncompliance with his Keppra .    Admitted 8/21 with COVID PNA.   Follow up 5/22 he was having facial and arm tingling, no other neuro symptoms. Ethanol level 145.   He's had numerous clinic visits and admissions over the last few years with volume overload in setting of noncompliance with medical therapy.  Admitted 6/25 w/ a/c CHF, in the setting of med nonadherence. Had reportedly been out of meds x 1 month. Echo w/ EF less than 20%, moderately reduced RV. Diuresed w/ IV Lasix  and started back on HF GDMT.  D/c wt 146 lb.   Seen for follow-up 08/05/24 and was off all medications after running out.  He was sent to the ED after clinic labs resulted for severe hypokalemia. He was admitted overnight, K repleted. UDS + for methamphetamine  10/06/24: Here today for CHF follow-up. Feels okay from HF standpoint. Denies shortness of breath, orthopnea, PND and lower  extremity edema. He gets his meds bubble packed from [employer]. Keppra , Zetia  and Losartan  were not included in his bubble packs. He had been out of his meds until a few days ago. The bubble packs were delivered to his old address and were just brought to him by a neighbor.   He returns today for heart failure follow up. Overall feeling ***. NYHA ***. Reports {Symptoms; cardiac:12860::dyspnea,fatigue}. Denies {Symptoms; cardiac:12860::chest pain,dyspnea,fatigue,near-syncope,orthopnea,palpitations,dizziness,abnormal bleeding}. Able to perform ADLs. Appetite okay. Weight at home ***. BP at home***. Compliant with all medications.   Rents a trailer with his girlfriend. Has medicaid. He denies ETOH and tobacco use.   ROS: All systems negative except as listed in HPI, PMH and Problem List.  SH:  Social History   Socioeconomic History   Marital status: Divorced    Spouse name: Not on file   Number of children: Not on file   Years of education: Not on file   Highest education level: Not on file  Occupational History   Not on file  Tobacco Use   Smoking status: Never   Smokeless tobacco: Never  Vaping Use   Vaping status: Never Used  Substance and Sexual Activity   Alcohol use: Not Currently    Alcohol/week: 14.0 standard drinks of alcohol    Types: 14 Cans of beer per week    Comment: patient endorses at least 1-2 beers daily (04/2020); 03/2023   Drug use: No   Sexual activity: Not Currently  Other Topics Concern  Not on file  Social History Narrative   Lives at home with his brother who is crippled. He takes care of his brother.    Right handed    Social Drivers of Health   Financial Resource Strain: High Risk (02/15/2023)   Overall Financial Resource Strain (CARDIA)    Difficulty of Paying Living Expenses: Very hard  Food Insecurity: No Food Insecurity (08/06/2024)   Hunger Vital Sign    Worried About Running Out of Food in the Last Year: Never true     Ran Out of Food in the Last Year: Never true  Transportation Needs: Unmet Transportation Needs (08/14/2024)   PRAPARE - Transportation    Lack of Transportation (Medical): Yes    Lack of Transportation (Non-Medical): Yes  Physical Activity: Not on file  Stress: Not on file  Social Connections: Not on file  Intimate Partner Violence: Not At Risk (08/06/2024)   Humiliation, Afraid, Rape, and Kick questionnaire    Fear of Current or Ex-Partner: No    Emotionally Abused: No    Physically Abused: No    Sexually Abused: No   FH:  Family History  Problem Relation Age of Onset   Alzheimer's disease Mother    Lung disease Father    Heart attack Other    Diabetes Neg Hx    Colon cancer Neg Hx    Esophageal cancer Neg Hx    Pancreatic cancer Neg Hx    Stomach cancer Neg Hx    Past Medical History:  Diagnosis Date   Acute exacerbation of CHF (congestive heart failure) (HCC) 06/12/2024   Acute hypoxic respiratory failure (HCC) 06/12/2024   Acute on chronic combined systolic and diastolic CHF (congestive heart failure) (HCC) 09/30/2022   AICD (automatic cardioverter/defibrillator) present    Alcohol abuse 05/18/2020   Alcohol use 06/12/2024   Arthritis    hands; maybe my toes (12/20/2017)   CAD (coronary artery disease)    Cardiac arrest (HCC) 11/29/2017   hx VF arrest/notes 12/20/2017   Cardiac arrest, cause unspecified (HCC) 07/06/2020   Cellulitis of left upper extremity 05/18/2020   CHF (congestive heart failure) (HCC)    Chronic systolic heart failure (HCC) 08/25/2019   Closed fracture of body of left scapula 08/19/2015   Closed fracture of right upper limb 01/27/2021   Coronary artery disease    Coronary artery disease involving native coronary artery of native heart without angina pectoris 12/07/2017   Elevated AST (SGOT) 08/14/2020   Essential hypertension 05/18/2020   GERD (gastroesophageal reflux disease)    H/O ETOH abuse    /notes 12/20/2017   H/O medication  noncompliance 06/12/2024   High cholesterol    History of CAD (coronary artery disease) 06/12/2024   History of ventricular fibrillation 06/12/2024   Hypertension    Hypokalemia 09/30/2022   Hyponatremia 08/14/2020   Injury to ligament of cervical spine 08/19/2015   Ischemic cardiomyopathy    a. 08/2019 s/p BSX D232 single lead AICD.   Lactic acidosis 05/18/2020   Left arm cellulitis 05/19/2020   Mixed hyperlipidemia 05/18/2020   Pneumonia due to COVID-19 virus 08/14/2020   Prolonged QT interval 08/14/2020   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/12/2017   Seizure (HCC) 10/21/2019   Seizures (HCC) ~ 2016; ~ 2017   I've had a couple; I was at work (12/20/2017)   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, 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) 12/30/2019   Syncope and collapse    last few days (12/20/2017)  Thrombocytopenia 08/18/2015   TIA (transient ischemic attack) 08/18/2015   Uninsured 08/31/2015   Volume depletion 08/18/2015   Current Outpatient Medications  Medication Sig Dispense Refill   ASPIRIN  LOW DOSE 81 MG tablet TAKE 1 TABLET (81 MG TOTAL) BY MOUTH DAILY. SWALLOW WHOLE. (AM) 30 tablet 11   atorvastatin  (LIPITOR ) 80 MG tablet Take 1 tablet (80 mg total) by mouth daily. 90 tablet 0   empagliflozin  (JARDIANCE ) 10 MG TABS tablet Take 1 tablet (10 mg total) by mouth daily. 90 tablet 0   ezetimibe  (ZETIA ) 10 MG tablet Take 1 tablet (10 mg total) by mouth daily. 30 tablet 11   levETIRAcetam  (KEPPRA ) 500 MG tablet Take 1 tablet (500 mg total) by mouth 2 (two) times daily. 60 tablet 11   losartan  (COZAAR ) 25 MG tablet Take 0.5 tablets (12.5 mg total) by mouth daily. 45 tablet 3   metoprolol  succinate (TOPROL -XL) 25 MG 24 hr tablet Take 0.5 tablets (12.5 mg total) by mouth daily. 45 tablet 0   Multiple Vitamins-Minerals (MULTIVITAMIN WITH MINERALS) tablet Take 1 tablet by mouth daily. 90 tablet 0   potassium chloride  SA (KLOR-CON  M) 20 MEQ tablet Take 2 tablets (40 mEq  total) by mouth daily. 60 tablet 11   torsemide  (DEMADEX ) 20 MG tablet Take 3 tablets (60 mg total) by mouth daily. 270 tablet 0   No current facility-administered medications for this visit.   There were no vitals taken for this visit.  Wt Readings from Last 3 Encounters:  10/06/24 67 kg (147 lb 9.6 oz)  08/14/24 66.1 kg (145 lb 12.8 oz)  08/06/24 67 kg (147 lb 9.6 oz)   PHYSICAL EXAM: General: *** appearing. No distress  Cardiac: JVP ~*** cm. No murmurs  Resp: Lung sounds clear and equal B/L Abdomen: Soft, non-distended.  Extremities: Warm and dry.  *** edema.  Neuro: A&O x3. Affect pleasant.   [place] [employer] ICD Interrogation: HL was elevated to ~ 40 in early August, now trending down (13 today). Thoracic impedance trending up. ***  ASSESSMENT & PLAN: 1. Chronic Biventricular HF, ICM  - Echo 11/30/17 25-30% RV normal.   - Echo 03/2018: EF 40-45%  RV normal.  - Echo 5/20 EF ~ 25%. Has ICD.  - Echo 12/2019 EF 30-35% RV normal. - Mixed Ischemic + probable ETOH mediated component  - Echo (10/23): biventricular failure getting worse from LVEF 30-35% Mod RV--> LVEF < 20% severely reduced RV.  - Echo (6/25): EF < 20%, RV moderately reduced  - NYHA II. Volume okay on exam. ReDS normal at 33%. HL on his ICD had been elevated earlier this month, now trending down. - Continue Torsemide  60 mg daily - Continue Jardiance  10 mg daily - Restart losartan  at lower dose 12.5 mg daily (may have to wait a few weeks for this to be included in bubble packs, do not think he can manage on his own) - Continue Toprol  XL 12.5 mg daily - Now off digoxin  d/t elevated level and poor compliance  - No spiro with h/o gynecomastia. Could consider Inspra  but would avoid given h/o poor compliance  - Not a candidate for advanced therapies/transplant with ongoing ETOH/substance abuse and compliance (this includes home inotrope).  - BMET/BNP today  2. CAD - S/P CABG x 3 2018  - denies CP  - Continue ASA 81 mg  -  Continue Lipitor  80 mg.  - Restart Zetia  10 mg daily (will need to be added to bubble packs)   3. Seizure Disorder -  has full bottle of Keppra  but not taking - he will try to remember to take it BID until it can be added to bubble packs. Will send one time order to [facility] for bubble packs but needs f/u with Neurology and/or PCP   4. ETOH Abuse - Long hx of ETOH abuse. Reports he has not been drinking any alcohol recently.   5. Substance abuse - + for methamphetamine during recent admit - Denies any recent drug use  SDOH - followed by HF SW team. Given food bag last week. SW assists with arranging transportation. His pharmacy was contacted by HF SW to update his bubble packs with med changes and change his home address.  F/u 4 weeks with APP to assess volume and compliance  Follow up in *** with ***  Jose Tagliaferro, NP 11/14/24

## 2025-01-02 ENCOUNTER — Telehealth (HOSPITAL_COMMUNITY): Payer: Self-pay | Admitting: Licensed Clinical Social Worker

## 2025-01-02 NOTE — Telephone Encounter (Signed)
 CSW saw pt no longer listed as being in [facility]- attempted to call pt at listed numbers to reconnect with clinic but unable to reach or leave VM.  Was able to leave VM at patient brothers contact number requesting return call for information on how to reach patient.  Andriette HILARIO Leech, LCSW Clinical Social Worker [facility] Desk#: [phone] Cell#: [phone]

## 2025-01-15 ENCOUNTER — Telehealth: Payer: Self-pay | Admitting: Internal Medicine

## 2025-01-15 ENCOUNTER — Telehealth (HOSPITAL_COMMUNITY): Payer: Self-pay | Admitting: Licensed Clinical Social Worker

## 2025-01-15 NOTE — Telephone Encounter (Signed)
 CSW received call from [facility] reporting patient had been admitted there and was trying to reconnect with me- provided CSW with patients reported number [phone]- CSW attempted to call but unable to reach.  Per [facility] staff member patient now unhoused but had been referred to [facility] in anticipation of the weather this weekend.  CSW will continue to attempt contact  Jose Mckamie H. Jose Darley, LCSW Clinical Social Worker [facility] Desk#: [phone] Cell#: [phone]

## 2025-01-15 NOTE — Telephone Encounter (Signed)
 Error

## 2025-01-20 ENCOUNTER — Telehealth (HOSPITAL_COMMUNITY): Payer: Self-pay | Admitting: Licensed Clinical Social Worker

## 2025-01-20 NOTE — Telephone Encounter (Signed)
 CSW attempted to me contact with patient again- unable to reach or leave VM- call shelter in [facility] but was told he was not staying there  Andriette HILARIO Leech, LCSW Clinical Social Worker [facility] Desk#: [phone] Cell#: [phone]

## 2025-02-12 ENCOUNTER — Ambulatory Visit: Payer: Self-pay

## 2025-05-14 ENCOUNTER — Ambulatory Visit: Payer: Self-pay

## 2025-08-13 ENCOUNTER — Ambulatory Visit: Payer: Self-pay

## 2025-11-12 ENCOUNTER — Ambulatory Visit: Payer: Self-pay

## 2026-02-11 ENCOUNTER — Ambulatory Visit: Payer: Self-pay
